# Patient Record
Sex: Female | Born: 1970 | Race: Black or African American | Hispanic: No | Marital: Single | Smoking: Never smoker
Health system: Southern US, Community
[De-identification: ages and names within clinical notes are randomized; demographics above are authoritative.]

## PROBLEM LIST (undated history)

## (undated) DIAGNOSIS — E119 Type 2 diabetes mellitus without complications: Secondary | ICD-10-CM

## (undated) DIAGNOSIS — N6452 Nipple discharge: Secondary | ICD-10-CM

## (undated) DIAGNOSIS — D649 Anemia, unspecified: Secondary | ICD-10-CM

## (undated) DIAGNOSIS — I071 Rheumatic tricuspid insufficiency: Secondary | ICD-10-CM

## (undated) DIAGNOSIS — Q210 Ventricular septal defect: Secondary | ICD-10-CM

## (undated) DIAGNOSIS — Z9889 Other specified postprocedural states: Secondary | ICD-10-CM

## (undated) DIAGNOSIS — E669 Obesity, unspecified: Secondary | ICD-10-CM

## (undated) DIAGNOSIS — J449 Chronic obstructive pulmonary disease, unspecified: Secondary | ICD-10-CM

## (undated) DIAGNOSIS — Z8489 Family history of other specified conditions: Secondary | ICD-10-CM

## (undated) DIAGNOSIS — I272 Pulmonary hypertension, unspecified: Secondary | ICD-10-CM

## (undated) DIAGNOSIS — T8859XA Other complications of anesthesia, initial encounter: Secondary | ICD-10-CM

## (undated) DIAGNOSIS — R011 Cardiac murmur, unspecified: Secondary | ICD-10-CM

## (undated) DIAGNOSIS — R0600 Dyspnea, unspecified: Secondary | ICD-10-CM

## (undated) DIAGNOSIS — R112 Nausea with vomiting, unspecified: Secondary | ICD-10-CM

## (undated) DIAGNOSIS — I209 Angina pectoris, unspecified: Secondary | ICD-10-CM

## (undated) DIAGNOSIS — I10 Essential (primary) hypertension: Secondary | ICD-10-CM

## (undated) DIAGNOSIS — R519 Headache, unspecified: Secondary | ICD-10-CM

## (undated) DIAGNOSIS — J45909 Unspecified asthma, uncomplicated: Secondary | ICD-10-CM

## (undated) HISTORY — DX: Pulmonary hypertension, unspecified: I27.20

## (undated) HISTORY — DX: Ventricular septal defect: Q21.0

## (undated) HISTORY — DX: Type 2 diabetes mellitus without complications: E11.9

## (undated) HISTORY — DX: Unspecified asthma, uncomplicated: J45.909

## (undated) HISTORY — PX: TUBAL LIGATION: SHX77

## (undated) HISTORY — DX: Rheumatic tricuspid insufficiency: I07.1

## (undated) HISTORY — PX: CYST EXCISION: SHX5701

## (undated) HISTORY — DX: Obesity, unspecified: E66.9

---

## 1997-11-03 ENCOUNTER — Encounter: Admission: RE | Admit: 1997-11-03 | Discharge: 1998-02-01 | Payer: Self-pay | Admitting: Orthopaedic Surgery

## 1998-01-13 ENCOUNTER — Emergency Department (HOSPITAL_COMMUNITY): Admission: EM | Admit: 1998-01-13 | Discharge: 1998-01-13 | Payer: Self-pay | Admitting: Emergency Medicine

## 1998-01-26 ENCOUNTER — Encounter: Admission: RE | Admit: 1998-01-26 | Discharge: 1998-04-26 | Payer: Self-pay | Admitting: Orthopaedic Surgery

## 1998-01-27 ENCOUNTER — Emergency Department (HOSPITAL_COMMUNITY): Admission: EM | Admit: 1998-01-27 | Discharge: 1998-01-27 | Payer: Self-pay | Admitting: *Deleted

## 1998-04-29 ENCOUNTER — Ambulatory Visit (HOSPITAL_COMMUNITY): Admission: RE | Admit: 1998-04-29 | Discharge: 1998-04-29 | Payer: Self-pay | Admitting: Cardiology

## 1998-05-22 ENCOUNTER — Encounter: Payer: Self-pay | Admitting: *Deleted

## 1998-05-22 ENCOUNTER — Emergency Department (HOSPITAL_COMMUNITY): Admission: EM | Admit: 1998-05-22 | Discharge: 1998-05-22 | Payer: Self-pay | Admitting: *Deleted

## 1998-07-31 ENCOUNTER — Emergency Department (HOSPITAL_COMMUNITY): Admission: EM | Admit: 1998-07-31 | Discharge: 1998-07-31 | Payer: Self-pay | Admitting: Emergency Medicine

## 1998-09-13 ENCOUNTER — Emergency Department (HOSPITAL_COMMUNITY): Admission: EM | Admit: 1998-09-13 | Discharge: 1998-09-13 | Payer: Self-pay | Admitting: Emergency Medicine

## 1998-09-18 ENCOUNTER — Encounter: Payer: Self-pay | Admitting: Emergency Medicine

## 1998-09-18 ENCOUNTER — Emergency Department (HOSPITAL_COMMUNITY): Admission: EM | Admit: 1998-09-18 | Discharge: 1998-09-18 | Payer: Self-pay | Admitting: Emergency Medicine

## 1999-02-21 ENCOUNTER — Emergency Department (HOSPITAL_COMMUNITY): Admission: EM | Admit: 1999-02-21 | Discharge: 1999-02-21 | Payer: Self-pay | Admitting: Emergency Medicine

## 1999-03-17 ENCOUNTER — Encounter: Admission: RE | Admit: 1999-03-17 | Discharge: 1999-03-17 | Payer: Self-pay | Admitting: Internal Medicine

## 1999-04-13 ENCOUNTER — Encounter: Admission: RE | Admit: 1999-04-13 | Discharge: 1999-04-13 | Payer: Self-pay | Admitting: Obstetrics & Gynecology

## 1999-06-07 ENCOUNTER — Other Ambulatory Visit: Admission: RE | Admit: 1999-06-07 | Discharge: 1999-06-07 | Payer: Self-pay | Admitting: *Deleted

## 1999-09-14 ENCOUNTER — Encounter: Payer: Self-pay | Admitting: Cardiology

## 1999-09-14 ENCOUNTER — Ambulatory Visit (HOSPITAL_COMMUNITY): Admission: RE | Admit: 1999-09-14 | Discharge: 1999-09-14 | Payer: Self-pay | Admitting: Cardiology

## 2000-05-31 ENCOUNTER — Emergency Department (HOSPITAL_COMMUNITY): Admission: EM | Admit: 2000-05-31 | Discharge: 2000-05-31 | Payer: Self-pay | Admitting: Emergency Medicine

## 2000-06-09 ENCOUNTER — Encounter: Admission: RE | Admit: 2000-06-09 | Discharge: 2000-09-07 | Payer: Self-pay | Admitting: Cardiology

## 2000-07-13 ENCOUNTER — Encounter: Payer: Self-pay | Admitting: Cardiology

## 2000-07-13 ENCOUNTER — Ambulatory Visit (HOSPITAL_COMMUNITY): Admission: RE | Admit: 2000-07-13 | Discharge: 2000-07-13 | Payer: Self-pay | Admitting: Cardiology

## 2000-07-14 ENCOUNTER — Emergency Department (HOSPITAL_COMMUNITY): Admission: EM | Admit: 2000-07-14 | Discharge: 2000-07-14 | Payer: Self-pay | Admitting: Emergency Medicine

## 2000-10-23 ENCOUNTER — Encounter: Payer: Self-pay | Admitting: Emergency Medicine

## 2000-10-23 ENCOUNTER — Emergency Department (HOSPITAL_COMMUNITY): Admission: EM | Admit: 2000-10-23 | Discharge: 2000-10-23 | Payer: Self-pay | Admitting: Emergency Medicine

## 2000-11-22 ENCOUNTER — Emergency Department (HOSPITAL_COMMUNITY): Admission: EM | Admit: 2000-11-22 | Discharge: 2000-11-22 | Payer: Self-pay | Admitting: Emergency Medicine

## 2000-11-22 ENCOUNTER — Encounter: Payer: Self-pay | Admitting: Emergency Medicine

## 2000-12-04 ENCOUNTER — Other Ambulatory Visit: Admission: RE | Admit: 2000-12-04 | Discharge: 2000-12-04 | Payer: Self-pay | Admitting: Obstetrics and Gynecology

## 2000-12-19 ENCOUNTER — Ambulatory Visit (HOSPITAL_COMMUNITY): Admission: RE | Admit: 2000-12-19 | Discharge: 2000-12-19 | Payer: Self-pay | Admitting: Cardiology

## 2000-12-25 ENCOUNTER — Other Ambulatory Visit: Admission: RE | Admit: 2000-12-25 | Discharge: 2000-12-25 | Payer: Self-pay | Admitting: Obstetrics and Gynecology

## 2000-12-26 ENCOUNTER — Other Ambulatory Visit: Admission: RE | Admit: 2000-12-26 | Discharge: 2000-12-26 | Payer: Self-pay | Admitting: Obstetrics and Gynecology

## 2000-12-26 ENCOUNTER — Encounter (INDEPENDENT_AMBULATORY_CARE_PROVIDER_SITE_OTHER): Payer: Self-pay | Admitting: Specialist

## 2001-02-17 ENCOUNTER — Emergency Department (HOSPITAL_COMMUNITY): Admission: EM | Admit: 2001-02-17 | Discharge: 2001-02-17 | Payer: Self-pay

## 2001-03-13 ENCOUNTER — Encounter: Admission: RE | Admit: 2001-03-13 | Discharge: 2001-06-11 | Payer: Self-pay | Admitting: Cardiology

## 2001-03-15 ENCOUNTER — Emergency Department (HOSPITAL_COMMUNITY): Admission: EM | Admit: 2001-03-15 | Discharge: 2001-03-15 | Payer: Self-pay

## 2001-03-24 ENCOUNTER — Emergency Department (HOSPITAL_COMMUNITY): Admission: EM | Admit: 2001-03-24 | Discharge: 2001-03-24 | Payer: Self-pay | Admitting: Emergency Medicine

## 2001-08-14 ENCOUNTER — Encounter: Payer: Self-pay | Admitting: Emergency Medicine

## 2001-08-14 ENCOUNTER — Emergency Department (HOSPITAL_COMMUNITY): Admission: EM | Admit: 2001-08-14 | Discharge: 2001-08-14 | Payer: Self-pay | Admitting: Emergency Medicine

## 2001-08-21 ENCOUNTER — Encounter: Payer: Self-pay | Admitting: Cardiology

## 2001-08-21 ENCOUNTER — Ambulatory Visit (HOSPITAL_COMMUNITY): Admission: RE | Admit: 2001-08-21 | Discharge: 2001-08-21 | Payer: Self-pay | Admitting: Cardiology

## 2001-09-15 ENCOUNTER — Emergency Department (HOSPITAL_COMMUNITY): Admission: EM | Admit: 2001-09-15 | Discharge: 2001-09-15 | Payer: Self-pay

## 2002-03-19 ENCOUNTER — Emergency Department (HOSPITAL_COMMUNITY): Admission: EM | Admit: 2002-03-19 | Discharge: 2002-03-19 | Payer: Self-pay | Admitting: Emergency Medicine

## 2002-03-19 ENCOUNTER — Encounter: Payer: Self-pay | Admitting: Emergency Medicine

## 2002-04-15 ENCOUNTER — Encounter: Admission: RE | Admit: 2002-04-15 | Discharge: 2002-04-15 | Payer: Self-pay | Admitting: Cardiology

## 2002-04-15 ENCOUNTER — Encounter: Payer: Self-pay | Admitting: Cardiology

## 2002-04-25 ENCOUNTER — Encounter: Payer: Self-pay | Admitting: Emergency Medicine

## 2002-04-25 ENCOUNTER — Emergency Department (HOSPITAL_COMMUNITY): Admission: EM | Admit: 2002-04-25 | Discharge: 2002-04-25 | Payer: Self-pay | Admitting: Emergency Medicine

## 2002-07-26 ENCOUNTER — Emergency Department (HOSPITAL_COMMUNITY): Admission: EM | Admit: 2002-07-26 | Discharge: 2002-07-27 | Payer: Self-pay | Admitting: Emergency Medicine

## 2002-07-26 ENCOUNTER — Encounter: Payer: Self-pay | Admitting: Emergency Medicine

## 2002-07-27 ENCOUNTER — Encounter: Payer: Self-pay | Admitting: Emergency Medicine

## 2002-10-21 ENCOUNTER — Emergency Department (HOSPITAL_COMMUNITY): Admission: EM | Admit: 2002-10-21 | Discharge: 2002-10-21 | Payer: Self-pay

## 2002-12-10 ENCOUNTER — Emergency Department (HOSPITAL_COMMUNITY): Admission: EM | Admit: 2002-12-10 | Discharge: 2002-12-10 | Payer: Self-pay | Admitting: Emergency Medicine

## 2003-01-16 ENCOUNTER — Encounter: Admission: RE | Admit: 2003-01-16 | Discharge: 2003-01-16 | Payer: Self-pay | Admitting: Cardiology

## 2003-01-16 ENCOUNTER — Encounter: Payer: Self-pay | Admitting: Cardiology

## 2003-03-19 ENCOUNTER — Emergency Department (HOSPITAL_COMMUNITY): Admission: EM | Admit: 2003-03-19 | Discharge: 2003-03-19 | Payer: Self-pay | Admitting: Emergency Medicine

## 2003-10-03 ENCOUNTER — Other Ambulatory Visit: Admission: RE | Admit: 2003-10-03 | Discharge: 2003-10-03 | Payer: Self-pay | Admitting: Obstetrics and Gynecology

## 2003-10-08 ENCOUNTER — Encounter: Admission: RE | Admit: 2003-10-08 | Discharge: 2003-10-08 | Payer: Self-pay | Admitting: Obstetrics and Gynecology

## 2004-01-15 ENCOUNTER — Encounter: Admission: RE | Admit: 2004-01-15 | Discharge: 2004-01-15 | Payer: Self-pay | Admitting: Cardiology

## 2004-01-23 ENCOUNTER — Emergency Department (HOSPITAL_COMMUNITY): Admission: EM | Admit: 2004-01-23 | Discharge: 2004-01-23 | Payer: Self-pay | Admitting: Family Medicine

## 2004-03-11 ENCOUNTER — Emergency Department (HOSPITAL_COMMUNITY): Admission: EM | Admit: 2004-03-11 | Discharge: 2004-03-11 | Payer: Self-pay | Admitting: Emergency Medicine

## 2004-03-23 ENCOUNTER — Encounter: Admission: RE | Admit: 2004-03-23 | Discharge: 2004-06-21 | Payer: Self-pay | Admitting: Cardiology

## 2004-04-27 ENCOUNTER — Emergency Department (HOSPITAL_COMMUNITY): Admission: EM | Admit: 2004-04-27 | Discharge: 2004-04-27 | Payer: Self-pay | Admitting: Family Medicine

## 2004-04-30 ENCOUNTER — Encounter: Admission: RE | Admit: 2004-04-30 | Discharge: 2004-04-30 | Payer: Self-pay | Admitting: Cardiology

## 2004-09-09 ENCOUNTER — Emergency Department (HOSPITAL_COMMUNITY): Admission: EM | Admit: 2004-09-09 | Discharge: 2004-09-09 | Payer: Self-pay | Admitting: Family Medicine

## 2005-04-08 ENCOUNTER — Emergency Department (HOSPITAL_COMMUNITY): Admission: EM | Admit: 2005-04-08 | Discharge: 2005-04-08 | Payer: Self-pay | Admitting: Family Medicine

## 2005-08-18 ENCOUNTER — Encounter: Admission: RE | Admit: 2005-08-18 | Discharge: 2005-08-18 | Payer: Self-pay | Admitting: Cardiology

## 2006-04-23 ENCOUNTER — Emergency Department (HOSPITAL_COMMUNITY): Admission: EM | Admit: 2006-04-23 | Discharge: 2006-04-23 | Payer: Self-pay | Admitting: Family Medicine

## 2006-04-27 ENCOUNTER — Emergency Department (HOSPITAL_COMMUNITY): Admission: EM | Admit: 2006-04-27 | Discharge: 2006-04-27 | Payer: Self-pay | Admitting: Family Medicine

## 2006-04-30 ENCOUNTER — Emergency Department (HOSPITAL_COMMUNITY): Admission: EM | Admit: 2006-04-30 | Discharge: 2006-04-30 | Payer: Self-pay | Admitting: Emergency Medicine

## 2006-12-25 ENCOUNTER — Encounter: Admission: RE | Admit: 2006-12-25 | Discharge: 2006-12-25 | Payer: Self-pay | Admitting: Cardiology

## 2007-01-19 ENCOUNTER — Encounter: Admission: RE | Admit: 2007-01-19 | Discharge: 2007-01-19 | Payer: Self-pay | Admitting: Cardiology

## 2007-06-11 ENCOUNTER — Emergency Department (HOSPITAL_COMMUNITY): Admission: EM | Admit: 2007-06-11 | Discharge: 2007-06-11 | Payer: Self-pay | Admitting: Family Medicine

## 2007-08-28 ENCOUNTER — Emergency Department (HOSPITAL_COMMUNITY): Admission: EM | Admit: 2007-08-28 | Discharge: 2007-08-28 | Payer: Self-pay | Admitting: Family Medicine

## 2007-09-03 ENCOUNTER — Emergency Department (HOSPITAL_COMMUNITY): Admission: EM | Admit: 2007-09-03 | Discharge: 2007-09-03 | Payer: Self-pay | Admitting: Emergency Medicine

## 2007-12-06 ENCOUNTER — Ambulatory Visit (HOSPITAL_COMMUNITY): Admission: RE | Admit: 2007-12-06 | Discharge: 2007-12-06 | Payer: Self-pay | Admitting: Cardiology

## 2008-02-04 ENCOUNTER — Emergency Department (HOSPITAL_COMMUNITY): Admission: EM | Admit: 2008-02-04 | Discharge: 2008-02-04 | Payer: Self-pay | Admitting: Emergency Medicine

## 2008-08-31 ENCOUNTER — Emergency Department (HOSPITAL_COMMUNITY): Admission: EM | Admit: 2008-08-31 | Discharge: 2008-08-31 | Payer: Self-pay | Admitting: Family Medicine

## 2008-11-11 ENCOUNTER — Ambulatory Visit (HOSPITAL_COMMUNITY): Admission: RE | Admit: 2008-11-11 | Discharge: 2008-11-11 | Payer: Self-pay | Admitting: Cardiology

## 2008-11-11 ENCOUNTER — Encounter (INDEPENDENT_AMBULATORY_CARE_PROVIDER_SITE_OTHER): Payer: Self-pay | Admitting: Cardiology

## 2008-12-15 ENCOUNTER — Emergency Department (HOSPITAL_COMMUNITY): Admission: EM | Admit: 2008-12-15 | Discharge: 2008-12-15 | Payer: Self-pay | Admitting: Emergency Medicine

## 2009-07-20 ENCOUNTER — Emergency Department (HOSPITAL_COMMUNITY): Admission: EM | Admit: 2009-07-20 | Discharge: 2009-07-20 | Payer: Self-pay | Admitting: Family Medicine

## 2009-08-11 ENCOUNTER — Emergency Department (HOSPITAL_COMMUNITY): Admission: EM | Admit: 2009-08-11 | Discharge: 2009-08-11 | Payer: Self-pay | Admitting: Family Medicine

## 2009-08-21 ENCOUNTER — Emergency Department (HOSPITAL_COMMUNITY): Admission: EM | Admit: 2009-08-21 | Discharge: 2009-08-21 | Payer: Self-pay | Admitting: Emergency Medicine

## 2010-01-19 ENCOUNTER — Emergency Department (HOSPITAL_COMMUNITY): Admission: EM | Admit: 2010-01-19 | Discharge: 2010-01-19 | Payer: Self-pay | Admitting: Emergency Medicine

## 2010-01-27 ENCOUNTER — Encounter: Admission: RE | Admit: 2010-01-27 | Discharge: 2010-01-27 | Payer: Self-pay | Admitting: Cardiology

## 2010-02-26 ENCOUNTER — Emergency Department (HOSPITAL_COMMUNITY): Admission: EM | Admit: 2010-02-26 | Discharge: 2010-02-26 | Payer: Self-pay | Admitting: Emergency Medicine

## 2010-04-28 ENCOUNTER — Emergency Department (HOSPITAL_COMMUNITY): Admission: EM | Admit: 2010-04-28 | Discharge: 2010-04-28 | Payer: Self-pay | Admitting: Family Medicine

## 2010-05-23 ENCOUNTER — Emergency Department (HOSPITAL_COMMUNITY)
Admission: EM | Admit: 2010-05-23 | Discharge: 2010-05-23 | Payer: Self-pay | Source: Home / Self Care | Admitting: Emergency Medicine

## 2010-06-22 ENCOUNTER — Emergency Department (HOSPITAL_COMMUNITY)
Admission: EM | Admit: 2010-06-22 | Discharge: 2010-06-22 | Payer: Self-pay | Source: Home / Self Care | Admitting: Emergency Medicine

## 2010-07-17 ENCOUNTER — Encounter: Payer: Self-pay | Admitting: Cardiology

## 2010-07-18 ENCOUNTER — Encounter: Payer: Self-pay | Admitting: Cardiology

## 2010-07-27 ENCOUNTER — Other Ambulatory Visit: Payer: Self-pay | Admitting: Cardiology

## 2010-07-27 DIAGNOSIS — R630 Anorexia: Secondary | ICD-10-CM

## 2010-07-29 ENCOUNTER — Inpatient Hospital Stay: Admission: RE | Admit: 2010-07-29 | Payer: Self-pay | Source: Ambulatory Visit

## 2010-07-29 ENCOUNTER — Other Ambulatory Visit (HOSPITAL_COMMUNITY): Payer: Self-pay | Admitting: Cardiology

## 2010-07-29 DIAGNOSIS — R630 Anorexia: Secondary | ICD-10-CM

## 2010-08-10 ENCOUNTER — Other Ambulatory Visit: Payer: Self-pay | Admitting: Obstetrics and Gynecology

## 2010-08-10 DIAGNOSIS — E010 Iodine-deficiency related diffuse (endemic) goiter: Secondary | ICD-10-CM

## 2010-08-11 ENCOUNTER — Ambulatory Visit (HOSPITAL_COMMUNITY)
Admission: RE | Admit: 2010-08-11 | Discharge: 2010-08-11 | Disposition: A | Discharge status: Home / Self Care | Payer: Medicaid Other | Source: Ambulatory Visit | Attending: Cardiology | Admitting: Cardiology

## 2010-08-11 DIAGNOSIS — R630 Anorexia: Secondary | ICD-10-CM | POA: Insufficient documentation

## 2010-08-11 DIAGNOSIS — R109 Unspecified abdominal pain: Secondary | ICD-10-CM | POA: Insufficient documentation

## 2010-08-11 MED ORDER — TECHNETIUM TC 99M SULFUR COLLOID
2.0000 | Freq: Once | INTRAVENOUS | Status: AC | PRN
  Administered 2010-08-11: 2 via INTRAVENOUS

## 2010-08-12 ENCOUNTER — Ambulatory Visit
Admission: RE | Admit: 2010-08-12 | Discharge: 2010-08-12 | Disposition: A | Discharge status: Home / Self Care | Payer: Medicaid Other | Source: Ambulatory Visit | Attending: Obstetrics and Gynecology | Admitting: Obstetrics and Gynecology

## 2010-08-12 DIAGNOSIS — E010 Iodine-deficiency related diffuse (endemic) goiter: Secondary | ICD-10-CM

## 2010-09-09 LAB — DIFFERENTIAL
Lymphs Abs: 1.7 10*3/uL (ref 0.7–4.0)
Monocytes Relative: 9 % (ref 3–12)

## 2010-09-09 LAB — POCT I-STAT, CHEM 8
Calcium, Ion: 1.27 mmol/L (ref 1.12–1.32)
Chloride: 105 mEq/L (ref 96–112)
HCT: 36 % (ref 36.0–46.0)
Hemoglobin: 12.2 g/dL (ref 12.0–15.0)
Potassium: 4 mEq/L (ref 3.5–5.1)

## 2010-09-09 LAB — CBC
HCT: 33.7 % — ABNORMAL LOW (ref 36.0–46.0)
Hemoglobin: 11 g/dL — ABNORMAL LOW (ref 12.0–15.0)
MCH: 26.1 pg (ref 26.0–34.0)
MCHC: 32.6 g/dL (ref 30.0–36.0)
MCV: 80 fL (ref 78.0–100.0)
Platelets: 207 10*3/uL (ref 150–400)
WBC: 5.4 10*3/uL (ref 4.0–10.5)

## 2010-09-09 LAB — D-DIMER, QUANTITATIVE: D-Dimer, Quant: 0.29 ug/mL-FEU (ref 0.00–0.48)

## 2010-09-22 ENCOUNTER — Emergency Department (HOSPITAL_COMMUNITY)
Admission: EM | Admit: 2010-09-22 | Discharge: 2010-09-23 | Disposition: A | Discharge status: Home / Self Care | Payer: No Typology Code available for payment source | Attending: Emergency Medicine | Admitting: Emergency Medicine

## 2010-09-22 DIAGNOSIS — S2341XA Sprain of ribs, initial encounter: Secondary | ICD-10-CM | POA: Insufficient documentation

## 2010-09-22 DIAGNOSIS — R0789 Other chest pain: Secondary | ICD-10-CM | POA: Insufficient documentation

## 2010-12-31 ENCOUNTER — Inpatient Hospital Stay (INDEPENDENT_AMBULATORY_CARE_PROVIDER_SITE_OTHER)
Admission: RE | Admit: 2010-12-31 | Discharge: 2010-12-31 | Disposition: A | Discharge status: Home / Self Care | Payer: Medicaid Other | Source: Ambulatory Visit | Attending: Family Medicine | Admitting: Family Medicine

## 2010-12-31 DIAGNOSIS — W57XXXA Bitten or stung by nonvenomous insect and other nonvenomous arthropods, initial encounter: Secondary | ICD-10-CM

## 2010-12-31 DIAGNOSIS — T148 Other injury of unspecified body region: Secondary | ICD-10-CM

## 2011-02-08 ENCOUNTER — Inpatient Hospital Stay (INDEPENDENT_AMBULATORY_CARE_PROVIDER_SITE_OTHER)
Admission: RE | Admit: 2011-02-08 | Discharge: 2011-02-08 | Disposition: A | Discharge status: Home / Self Care | Payer: Medicaid Other | Source: Ambulatory Visit | Attending: Family Medicine | Admitting: Family Medicine

## 2011-02-08 DIAGNOSIS — N61 Mastitis without abscess: Secondary | ICD-10-CM

## 2011-02-08 DIAGNOSIS — R1013 Epigastric pain: Secondary | ICD-10-CM

## 2011-02-08 DIAGNOSIS — B354 Tinea corporis: Secondary | ICD-10-CM

## 2011-02-08 LAB — POCT URINALYSIS DIP (DEVICE)
Glucose, UA: NEGATIVE mg/dL
Leukocytes, UA: NEGATIVE
Nitrite: NEGATIVE
Protein, ur: NEGATIVE mg/dL
Specific Gravity, Urine: >=1.03 (ref 1.005–1.030)

## 2011-02-28 ENCOUNTER — Inpatient Hospital Stay (INDEPENDENT_AMBULATORY_CARE_PROVIDER_SITE_OTHER)
Admission: RE | Admit: 2011-02-28 | Discharge: 2011-02-28 | Disposition: A | Discharge status: Home / Self Care | Payer: Medicaid Other | Source: Ambulatory Visit | Attending: Family Medicine | Admitting: Family Medicine

## 2011-02-28 DIAGNOSIS — L259 Unspecified contact dermatitis, unspecified cause: Secondary | ICD-10-CM

## 2011-03-15 ENCOUNTER — Emergency Department (HOSPITAL_COMMUNITY)
Admission: EM | Admit: 2011-03-15 | Discharge: 2011-03-15 | Disposition: A | Discharge status: Home / Self Care | Payer: Medicaid Other | Attending: Emergency Medicine | Admitting: Emergency Medicine

## 2011-03-15 ENCOUNTER — Emergency Department (HOSPITAL_COMMUNITY): Payer: Medicaid Other

## 2011-03-15 DIAGNOSIS — M25569 Pain in unspecified knee: Secondary | ICD-10-CM | POA: Insufficient documentation

## 2011-04-21 ENCOUNTER — Inpatient Hospital Stay (INDEPENDENT_AMBULATORY_CARE_PROVIDER_SITE_OTHER)
Admission: RE | Admit: 2011-04-21 | Discharge: 2011-04-21 | Disposition: A | Discharge status: Home / Self Care | Payer: Medicaid Other | Source: Ambulatory Visit | Attending: Family Medicine | Admitting: Family Medicine

## 2011-04-21 DIAGNOSIS — J069 Acute upper respiratory infection, unspecified: Secondary | ICD-10-CM

## 2011-04-21 DIAGNOSIS — J029 Acute pharyngitis, unspecified: Secondary | ICD-10-CM

## 2011-05-29 ENCOUNTER — Emergency Department (HOSPITAL_COMMUNITY): Payer: Medicaid Other

## 2011-05-29 ENCOUNTER — Emergency Department (HOSPITAL_COMMUNITY)
Admission: EM | Admit: 2011-05-29 | Discharge: 2011-05-29 | Disposition: A | Discharge status: Home / Self Care | Payer: Medicaid Other | Attending: Emergency Medicine | Admitting: Emergency Medicine

## 2011-05-29 ENCOUNTER — Encounter (HOSPITAL_COMMUNITY): Payer: Self-pay

## 2011-05-29 DIAGNOSIS — R509 Fever, unspecified: Secondary | ICD-10-CM | POA: Insufficient documentation

## 2011-05-29 DIAGNOSIS — R110 Nausea: Secondary | ICD-10-CM | POA: Insufficient documentation

## 2011-05-29 DIAGNOSIS — R059 Cough, unspecified: Secondary | ICD-10-CM | POA: Insufficient documentation

## 2011-05-29 DIAGNOSIS — J069 Acute upper respiratory infection, unspecified: Secondary | ICD-10-CM | POA: Insufficient documentation

## 2011-05-29 DIAGNOSIS — R05 Cough: Secondary | ICD-10-CM | POA: Insufficient documentation

## 2011-05-29 DIAGNOSIS — R51 Headache: Secondary | ICD-10-CM | POA: Insufficient documentation

## 2011-05-29 DIAGNOSIS — R011 Cardiac murmur, unspecified: Secondary | ICD-10-CM | POA: Insufficient documentation

## 2011-05-29 DIAGNOSIS — W1809XA Striking against other object with subsequent fall, initial encounter: Secondary | ICD-10-CM | POA: Insufficient documentation

## 2011-05-29 LAB — POCT I-STAT, CHEM 8
BUN: 11 mg/dL (ref 6–23)
Calcium, Ion: 1.12 mmol/L (ref 1.12–1.32)
Chloride: 99 mEq/L (ref 96–112)
Creatinine, Ser: 1.1 mg/dL (ref 0.50–1.10)
Glucose, Bld: 154 mg/dL — ABNORMAL HIGH (ref 70–99)
HCT: 43 % (ref 36.0–46.0)
Hemoglobin: 14.6 g/dL (ref 12.0–15.0)
Potassium: 3.8 mEq/L (ref 3.5–5.1)
Sodium: 138 mEq/L (ref 135–145)
TCO2: 28 mmol/L (ref 0–100)

## 2011-05-29 LAB — POCT I-STAT TROPONIN I: Troponin i, poc: 0 ng/mL (ref 0.00–0.08)

## 2011-05-29 MED ORDER — IBUPROFEN 800 MG PO TABS
800.0000 mg | ORAL_TABLET | Freq: Once | ORAL | Status: AC
  Administered 2011-05-29: 800 mg via ORAL
  Filled 2011-05-29: qty 1

## 2011-05-29 MED ORDER — ONDANSETRON 8 MG PO TBDP
8.0000 mg | ORAL_TABLET | Freq: Once | ORAL | Status: AC
  Administered 2011-05-29: 8 mg via ORAL
  Filled 2011-05-29: qty 1

## 2011-05-29 MED ORDER — ONDANSETRON HCL 4 MG PO TABS: 4.0000 mg | ORAL_TABLET | Freq: Four times a day (QID) | ORAL | Status: AC

## 2011-05-29 NOTE — ED Notes (Signed)
Pt in from home with c/o generalized body aches x2 days states syncope episode yesterday unwitnessed states nose pain states cold sx no relief no apparent distress

## 2011-05-29 NOTE — ED Provider Notes (Signed)
History     CSN: [id number] Arrival date & time: 05/29/2011  8:36 AM   First MD Initiated Contact with Patient 05/29/11 1029      Chief Complaint  Patient presents with  . Generalized Body Aches    (Consider location/radiation/quality/duration/timing/severity/associated sxs/prior treatment) HPI Comments: Patient reports that she has had generalized body aches, productive cough, sore throat, rhinorrhea, and congestion for the past week.  She reports that yesterday she had a syncopal episode. She reports that she was urinating and then began feeling dizzy and fell to the ground after she stood up from the toilet.  She reports that she hit her nose on the toilet when she fell and has been having pain in her nose since that time.  No difficulty breathing out of her nose.  She also is describing a dull chest pain.  She reports that the pain is worse with coughing and deep inspiration.  She also reports that she had a fever yesterday that was 101F orally.  She reports that she began feeling nauseous earlier today.  No vomiting.  Patient is a 40 y.o. female presenting with URI. The history is provided by the patient.  URI The primary symptoms include fever, fatigue, headaches, sore throat, cough and nausea. Primary symptoms do not include ear pain, wheezing, abdominal pain, vomiting or rash. The current episode started 3 to 5 days ago. This is a new problem. The problem has been gradually worsening.  The headache is not associated with neck stiffness.  Symptoms associated with the illness include chills, congestion and rhinorrhea. The illness is not associated with facial pain or sinus pressure.    History reviewed. No pertinent past medical history.  History reviewed. No pertinent past surgical history.  No family history on file.  History  Substance Use Topics  . Smoking status: Never Smoker   . Smokeless tobacco: Not on file  . Alcohol Use: No    OB History    Grav Para Term Preterm  Abortions TAB SAB Ect Mult Living                  Review of Systems  Constitutional: Positive for fever, chills, appetite change and fatigue.  HENT: Positive for congestion, sore throat and rhinorrhea. Negative for ear pain, neck pain, neck stiffness and sinus pressure.   Eyes: Negative for visual disturbance.  Respiratory: Positive for cough. Negative for wheezing.   Gastrointestinal: Positive for nausea. Negative for vomiting, abdominal pain, diarrhea and constipation.  Genitourinary: Negative for dysuria, hematuria, flank pain and decreased urine volume.  Skin: Negative for rash.  Neurological: Positive for headaches.    Allergies  Penicillins  Home Medications  No current outpatient prescriptions on file.  BP 117/72  Pulse 98  Temp(Src) 97.8 F (36.6 C) (Oral)  Resp 20  SpO2 99%  LMP 04/28/2011  Physical Exam  Constitutional: She is oriented to person, place, and time. She appears well-developed and well-nourished. No distress.  HENT:  Head: Normocephalic and atraumatic. Head is without contusion.  Right Ear: Tympanic membrane normal. No hemotympanum.  Left Ear: Tympanic membrane normal. No hemotympanum.  Nose: Mucosal edema present.  Mouth/Throat: Uvula is midline, oropharynx is clear and moist and mucous membranes are normal.  Eyes: EOM are normal. Pupils are equal, round, and reactive to light.  Neck: Normal range of motion. Neck supple.  Cardiovascular: Normal rate and regular rhythm.   Murmur heard. Pulmonary/Chest: Effort normal and breath sounds normal. No respiratory distress. She has no wheezes.  Abdominal: Soft. Bowel sounds are normal. There is no tenderness.  Musculoskeletal: Normal range of motion.  Lymphadenopathy:    She has no cervical adenopathy.  Neurological: She is alert and oriented to person, place, and time. No cranial nerve deficit.  Skin: Skin is warm and dry. No rash noted. She is not diaphoretic.  Psychiatric: She has a normal mood and  affect.    ED Course  Procedures (including critical care time)   Labs Reviewed  I-STAT TROPONIN I  I-STAT, CHEM 8   Dg Nasal Bones  05/29/2011  *RADIOLOGY REPORT*  Clinical Data: Fall, nasal bone tenderness.  NASAL BONES - 3+ VIEW  Comparison: None.  Findings: No evidence of nasal bone fracture.  Nasal septum is midline. Paranasal sinuses appear clear.  IMPRESSION: No evidence of nasal bone fracture.  Original Report Authenticated By: Cyndie Chime, M.D.   Dg Chest 2 View  05/29/2011  *RADIOLOGY REPORT*  Clinical Data: Fever and productive cough.  Rule out pneumonia.  CHEST - 2 VIEW  Comparison: 02/26/2010  Findings: Two views of the chest demonstrate a stable appearance of the heart and mediastinum.  The heart size is upper limits of normal.  Lungs are clear without airspace disease or edema.  No evidence for effusions.  Bony structures are intact. The patient has a known nodule in the right upper lung and possibly seen on the lateral view.  IMPRESSION: No acute chest findings.  Original Report Authenticated By: Richarda Overlie, M.D.     No diagnosis found.   Date: 05/29/2011  Rate: 94  Rhythm: normal sinus rhythm  QRS Axis: normal  Intervals: normal  ST/T Wave abnormalities: normal  Conduction Disutrbances:none  Narrative Interpretation:   Old EKG Reviewed: none available   MDM  Due to new syncopal episode and current chest pain, an EKG and troponin were ordered along with an istat 8.  Normal EKG, troponin 0.0, and normal labs.  Negative CXR.  Therefore, feel that patient's symptoms most likely viral.  Patient instructed to follow up with her PCP.        Pascal Lux Wingen 05/30/11 1242  Brigg Cape Zenaida Niece Wingen 05/30/11 1243

## 2011-05-30 NOTE — ED Provider Notes (Signed)
Medical screening examination/treatment/procedure(s) were performed by non-physician practitioner and as supervising physician I was immediately available for consultation/collaboration.  Raeford Razor, MD 05/30/11 [phone]

## 2011-07-31 ENCOUNTER — Other Ambulatory Visit: Payer: Self-pay

## 2011-07-31 ENCOUNTER — Emergency Department (HOSPITAL_COMMUNITY): Payer: Medicaid Other

## 2011-07-31 ENCOUNTER — Encounter (HOSPITAL_COMMUNITY): Payer: Self-pay | Admitting: *Deleted

## 2011-07-31 ENCOUNTER — Emergency Department (HOSPITAL_COMMUNITY)
Admission: EM | Admit: 2011-07-31 | Discharge: 2011-07-31 | Disposition: A | Discharge status: Home / Self Care | Payer: Medicaid Other | Attending: Emergency Medicine | Admitting: Emergency Medicine

## 2011-07-31 DIAGNOSIS — R7309 Other abnormal glucose: Secondary | ICD-10-CM | POA: Insufficient documentation

## 2011-07-31 DIAGNOSIS — R079 Chest pain, unspecified: Secondary | ICD-10-CM | POA: Insufficient documentation

## 2011-07-31 DIAGNOSIS — R0602 Shortness of breath: Secondary | ICD-10-CM | POA: Insufficient documentation

## 2011-07-31 DIAGNOSIS — R0789 Other chest pain: Secondary | ICD-10-CM

## 2011-07-31 DIAGNOSIS — R739 Hyperglycemia, unspecified: Secondary | ICD-10-CM

## 2011-07-31 HISTORY — DX: Essential (primary) hypertension: I10

## 2011-07-31 LAB — POCT I-STAT, CHEM 8
BUN: 11 mg/dL (ref 6–23)
Calcium, Ion: 1.22 mmol/L (ref 1.12–1.32)
Chloride: 106 mEq/L (ref 96–112)
Creatinine, Ser: 0.7 mg/dL (ref 0.50–1.10)
Glucose, Bld: 158 mg/dL — ABNORMAL HIGH (ref 70–99)
TCO2: 28 mmol/L (ref 0–100)

## 2011-07-31 LAB — BASIC METABOLIC PANEL
BUN: 11 mg/dL (ref 6–23)
CO2: 27 mEq/L (ref 19–32)
Chloride: 104 mEq/L (ref 96–112)
GFR calc non Af Amer: >90 mL/min (ref >90)
Glucose, Bld: 150 mg/dL — ABNORMAL HIGH (ref 70–99)
Potassium: 4.7 mEq/L (ref 3.5–5.1)

## 2011-07-31 LAB — CBC
HCT: 38.1 % (ref 36.0–46.0)
Hemoglobin: 13.7 g/dL (ref 12.0–15.0)
MCH: 31.6 pg (ref 26.0–34.0)
MCHC: 36 g/dL (ref 30.0–36.0)
MCV: 88 fL (ref 78.0–100.0)
RBC: 4.33 MIL/uL (ref 3.87–5.11)

## 2011-07-31 MED ORDER — KETOROLAC TROMETHAMINE 30 MG/ML IJ SOLN
30.0000 mg | Freq: Once | INTRAMUSCULAR | Status: AC
  Administered 2011-07-31: 30 mg via INTRAMUSCULAR
  Filled 2011-07-31: qty 1

## 2011-07-31 MED ORDER — HYDROCODONE-ACETAMINOPHEN 5-500 MG PO TABS: 1.0000 | ORAL_TABLET | Freq: Four times a day (QID) | ORAL | Status: AC | PRN

## 2011-07-31 NOTE — ED Provider Notes (Signed)
History     CSN: [id number]  Arrival date & time 07/31/11  1639   First MD Initiated Contact with Patient 07/31/11 1646      Chief Complaint  Patient presents with  . Chest Pain   patient reports sharp, achy chest pain for the past 3 days. She states it is especially worsened with certain positions and certain movements, but not necessarily with exertion. She has no shortness of breath, but some pain with deep breathing. She does have a history of VSD since childhood, but no other extensive cardiac history. She's had no fevers or cough. No vomiting. No trauma. She's been taking ibuprofen with minimal relief. She also states it is especially painful if she pushes on her chest.  No smoking or any illicit drugs.  No strong h/o family premature HD.   (Consider location/radiation/quality/duration/timing/severity/associated sxs/prior treatment) HPI  History reviewed. No pertinent past medical history.  Past Surgical History  Procedure Date  . Tubal ligation     History reviewed. No pertinent family history.  History  Substance Use Topics  . Smoking status: Never Smoker   . Smokeless tobacco: Never Used  . Alcohol Use: No    OB History    Grav Para Term Preterm Abortions TAB SAB Ect Mult Living                  Review of Systems  All other systems reviewed and are negative.    Allergies  Penicillins  Home Medications   Current Outpatient Rx  Name Route Sig Dispense Refill  . LORAZEPAM 0.5 MG PO TABS Oral Take 0.5-1 mg by mouth every 8 (eight) hours. For anxiety      . NORETHIN-ETH ESTRAD-FE BIPHAS 1 MG-10 MCG / 10 MCG PO TABS Oral Take 1 tablet by mouth daily.        BP 154/90  Pulse 90  Temp(Src) 97.9 F (36.6 C) (Oral)  Resp 16  Wt 164 lb (74.39 kg)  SpO2 99%  LMP 07/31/2011  Physical Exam  Nursing note and vitals reviewed. Constitutional: She is oriented to person, place, and time. She appears well-developed and well-nourished.       Calm comfortable,  and cooperative  HENT:  Head: Normocephalic and atraumatic.  Eyes: Conjunctivae and EOM are normal. Pupils are equal, round, and reactive to light.  Neck: Neck supple.  Cardiovascular: Normal rate and regular rhythm.  Exam reveals no gallop and no friction rub.   No murmur heard. Pulmonary/Chest: Breath sounds normal. She has no wheezes. She has no rales. She exhibits no tenderness.       Diffuse tenderness to palpation of the chest wall. No redness or swelling  Abdominal: Soft. Bowel sounds are normal. She exhibits no distension. There is no tenderness. There is no rebound and no guarding.  Musculoskeletal: Normal range of motion.  Neurological: She is alert and oriented to person, place, and time. No cranial nerve deficit. Coordination normal.  Skin: Skin is warm and dry. No rash noted.  Psychiatric: She has a normal mood and affect.    ED Course  Procedures (including critical care time)  Labs Reviewed - No data to display No results found.   No diagnosis found.    MDM   Date: 07/31/2011  Rate: 84  Rhythm: normal sinus rhythm  QRS Axis: normal  Intervals: normal  ST/T Wave abnormalities: nonspecific ST changes and early repolarization  Conduction Disutrbances:none  Narrative Interpretation:   Old EKG Reviewed: unchanged Comp. To Feb 26, 2010.  Possible LVH.       Patient's Medications  New Prescriptions   No medications on file  Previous Medications   NORETHINDRONE-ETHINYL ESTRADIOL-FE BIPHAS (LO LOESTRIN FE) 1 MG-10 MCG / 10 MCG TABLET    Take 1 tablet by mouth daily.    Modified Medications   No medications on file  Discontinued Medications   LORAZEPAM (ATIVAN) 0.5 MG TABLET    Take 0.5-1 mg by mouth every 8 (eight) hours. For anxiety       6:16 PM  Patient was given her test results. Chest x-ray, troponin, electrolytes were normal. CBC was normal. Blood sugar was elevated at 150, possibly consistent with early type 2 diabetes. Patient was given  recommendations for diet. She was also, told she'll need to see her primary care physician and have a flat, fasting blood sugar done in possibly other outpatient tests.  The chest pain appears to be atypical and will be given a prescription for pain medication. She'll be discharged home in stable improved condition again. Pain has been going on for several days. Reproducible with palpation. Return to ED for any concerns  Theron Arista A. Patrica Duel, MD 07/31/11 4098

## 2011-07-31 NOTE — ED Notes (Signed)
Pt reports sudden onset of chest pressure to center of chest. Pt reports pain is constant. Pt reports pain to arm and leg on left side comes and goes. Pt denies nausea. Pt reports intermittent nausea. 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 Pt took ibuprofen about 2 hours ago. Pt reports taking this medication for the past few days without relief.

## 2011-07-31 NOTE — ED Notes (Signed)
Pt from home c/o left chest pain for 3 days accompanied by nausea and radiation to left arm and left leg.

## 2011-08-30 ENCOUNTER — Ambulatory Visit (HOSPITAL_BASED_OUTPATIENT_CLINIC_OR_DEPARTMENT_OTHER): Payer: Medicaid Other | Attending: Cardiology | Admitting: General Practice

## 2011-08-30 VITALS — Ht 59.0 in | Wt 165.0 lb

## 2011-08-30 DIAGNOSIS — G4700 Insomnia, unspecified: Secondary | ICD-10-CM | POA: Insufficient documentation

## 2011-08-30 DIAGNOSIS — G4733 Obstructive sleep apnea (adult) (pediatric): Secondary | ICD-10-CM

## 2011-08-30 DIAGNOSIS — G4730 Sleep apnea, unspecified: Secondary | ICD-10-CM | POA: Insufficient documentation

## 2011-09-04 DIAGNOSIS — G4730 Sleep apnea, unspecified: Secondary | ICD-10-CM

## 2011-09-04 DIAGNOSIS — G4700 Insomnia, unspecified: Secondary | ICD-10-CM

## 2011-09-05 ENCOUNTER — Encounter (INDEPENDENT_AMBULATORY_CARE_PROVIDER_SITE_OTHER): Payer: Medicaid Other | Admitting: Obstetrics and Gynecology

## 2011-09-05 DIAGNOSIS — Z202 Contact with and (suspected) exposure to infections with a predominantly sexual mode of transmission: Secondary | ICD-10-CM

## 2011-09-05 DIAGNOSIS — N946 Dysmenorrhea, unspecified: Secondary | ICD-10-CM

## 2011-09-05 NOTE — Procedures (Signed)
NAME:  Mackenzie Patterson, MICEK NO.:  [id number]  MEDICAL RECORD NO.:  [id number]          PATIENT TYPE:  OUT  LOCATION:  SLEEP CENTER                 FACILITY:  [facility]  PHYSICIAN:  Clinton D. Maple Hudson, MD, FCCP, FACPDATE OF BIRTH:  12/07/71  DATE OF STUDY:  08/30/2011                           NOCTURNAL POLYSOMNOGRAM  REFERRING PHYSICIAN:  Osvaldo Shipper. Spruill, M.D.  INDICATION FOR STUDY:  Insomnia with sleep apnea.  EPWORTH SLEEPINESS SCORE:  15/24.  BMI 33.3, weight 165 pounds, height 59 inches, neck 14.5 inches.  HOME MEDICATIONS:  Charted and reviewed.  SLEEP ARCHITECTURE:  Total sleep time 364.5 minutes with sleep efficiency 88%.  Stage I was 6.7%, stage II 75%, stage III absent, REM 18.2% of total sleep time.  Sleep latency 16 minutes.  REM latency 16.5 minutes.  Awake after sleep onset 34 minutes.  Arousal index 11.2. Bedtime medication:  None.  RESPIRATORY DATA:  Apnea/hypopnea index (AHI) 0.3 per hour.  A total of 2 events were scored, including 1 central apnea and 1 hypopnea, both associated with supine sleep position and REM.  REM AHI 0.9 per hour.  OXYGEN DATA:  Moderate snoring with oxygen desaturation to a nadir of 93% and mean oxygen saturation through the study of 97.3% on room air.  CARDIAC DATA:  Normal sinus rhythm.  MOVEMENT/PARASOMNIA:  A total of 38 limb jerks were counted, of which 6 were associated with arousals or awakenings for periodic limb movements with arousal index of 1 per hour.  No bathroom trips.  IMPRESSION/RECOMMENDATION: 1. Occasional respiratory events with sleep disturbance, within normal     limits.  AHI 0.3 per hour (the normal range for adults is from 0-5     events per hour).  Moderate snoring with oxygen desaturation to a     nadir of 93% and mean oxygen saturation through the study of 97.3%     on room air. 2. Sleep architecture was not remarkable for first night in an     unfamiliar environment.  There were several brief  spontaneous     wakings suggesting that she might benefit from treatment as     insomnia if clinically appropriate.    Clinton D. Maple Hudson, MD, Tonny Bollman, FACP Diplomate, American Board of Sleep Medicine   CDY/MEDQ  D:  09/04/2011 09:38:58  T:  09/05/2011 01:43:57  Job:  [id number]

## 2011-09-26 ENCOUNTER — Encounter (INDEPENDENT_AMBULATORY_CARE_PROVIDER_SITE_OTHER): Payer: Medicaid Other | Admitting: Obstetrics and Gynecology

## 2011-09-26 DIAGNOSIS — N949 Unspecified condition associated with female genital organs and menstrual cycle: Secondary | ICD-10-CM

## 2011-10-12 ENCOUNTER — Telehealth: Payer: Self-pay | Admitting: Obstetrics and Gynecology

## 2011-10-17 ENCOUNTER — Other Ambulatory Visit: Payer: Self-pay

## 2011-10-17 DIAGNOSIS — N926 Irregular menstruation, unspecified: Secondary | ICD-10-CM

## 2011-10-24 ENCOUNTER — Encounter: Payer: Medicaid Other | Admitting: Obstetrics and Gynecology

## 2011-10-25 ENCOUNTER — Encounter: Payer: Medicaid Other | Admitting: Obstetrics and Gynecology

## 2011-11-03 ENCOUNTER — Encounter: Payer: Medicaid Other | Admitting: Obstetrics and Gynecology

## 2011-11-20 ENCOUNTER — Emergency Department (HOSPITAL_COMMUNITY)
Admission: EM | Admit: 2011-11-20 | Discharge: 2011-11-20 | Disposition: A | Discharge status: Home / Self Care | Payer: Medicaid Other | Attending: Emergency Medicine | Admitting: Emergency Medicine

## 2011-11-20 ENCOUNTER — Encounter (HOSPITAL_COMMUNITY): Payer: Self-pay | Admitting: Emergency Medicine

## 2011-11-20 DIAGNOSIS — R51 Headache: Secondary | ICD-10-CM | POA: Insufficient documentation

## 2011-11-20 DIAGNOSIS — R519 Headache, unspecified: Secondary | ICD-10-CM

## 2011-11-20 DIAGNOSIS — H9209 Otalgia, unspecified ear: Secondary | ICD-10-CM | POA: Insufficient documentation

## 2011-11-20 DIAGNOSIS — R070 Pain in throat: Secondary | ICD-10-CM | POA: Insufficient documentation

## 2011-11-20 DIAGNOSIS — Z79899 Other long term (current) drug therapy: Secondary | ICD-10-CM | POA: Insufficient documentation

## 2011-11-20 DIAGNOSIS — I10 Essential (primary) hypertension: Secondary | ICD-10-CM

## 2011-11-20 LAB — POCT I-STAT, CHEM 8
BUN: 11 mg/dL (ref 6–23)
Calcium, Ion: 1.22 mmol/L (ref 1.12–1.32)
Chloride: 103 mEq/L (ref 96–112)
Creatinine, Ser: 0.8 mg/dL (ref 0.50–1.10)
TCO2: 26 mmol/L (ref 0–100)

## 2011-11-20 MED ORDER — OXYCODONE-ACETAMINOPHEN 5-325 MG PO TABS: 1.0000 | ORAL_TABLET | ORAL | Status: AC | PRN

## 2011-11-20 MED ORDER — OXYCODONE-ACETAMINOPHEN 5-325 MG PO TABS
1.0000 | ORAL_TABLET | Freq: Once | ORAL | Status: AC
  Administered 2011-11-20: 1 via ORAL
  Filled 2011-11-20: qty 1

## 2011-11-20 NOTE — ED Notes (Signed)
Discharge instructions reviewed and prescription given  patient has no questions at this time.

## 2011-11-20 NOTE — Discharge Instructions (Signed)
General Headache, Without Cause A general headache has no specific cause. These headaches are not life-threatening. They will not lead to other types of headaches. HOME CARE   Make and keep follow-up visits with your doctor.   Only take medicine as told by your doctor.   Try to relax, get a massage, or use your thoughts to control your body (biofeedback).   Apply cold or heat to the head and neck. Apply 3 or 4 times a day or as needed.  Finding out the results of your test Ask when your test results will be ready. Make sure you get your test results. GET HELP RIGHT AWAY IF:   You have problems with medicine.   Your medicine does not help relieve pain.   Your headache changes or becomes worse.   You feel sick to your stomach (nauseous) or throw up (vomit).   You have a temperature by mouth above 102 F (38.9 C), not controlled by medicine.   Your have a stiff neck.   You have vision loss.   You have muscle weakness.   You lose control of your muscles.   You lose balance or have trouble walking.   You feel like you are going to pass out (faint).  MAKE SURE YOU:   Understand these instructions.   Will watch this condition.   Will get help right away if you are not doing well or get worse.  Document Released: 03/22/2008 Document Revised: 06/02/2011 Document Reviewed: 03/22/2008 ExitCare Patient Information 2012 [employer].Arterial Hypertension Arterial hypertension (high blood pressure) is a condition of elevated pressure in your blood vessels. Hypertension over a long period of time is a risk factor for strokes, heart attacks, and heart failure. It is also the leading cause of kidney (renal) failure.  CAUSES   In Adults -- Over 90% of all hypertension has no known cause. This is called essential or primary hypertension. In the other 10% of people with hypertension, the increase in blood pressure is caused by another disorder. This is called secondary hypertension.  Important causes of secondary hypertension are:   Heavy alcohol use.   Obstructive sleep apnea.   Hyperaldosterosim (Conn's syndrome).   Steroid use.   Chronic kidney failure.   Hyperparathyroidism.   Medications.   Renal artery stenosis.   Pheochromocytoma.   Cushing's disease.   Coarctation of the aorta.   Scleroderma renal crisis.   Licorice (in excessive amounts).   Drugs (cocaine, methamphetamine).  Your caregiver can explain any items above that apply to you.  In Children -- Secondary hypertension is more common and should always be considered.   Pregnancy -- Few women of childbearing age have high blood pressure. However, up to 10% of them develop hypertension of pregnancy. Generally, this will not harm the woman. It may be a sign of 3 complications of pregnancy: preeclampsia, HELLP syndrome, and eclampsia. Follow up and control with medication is necessary.  SYMPTOMS   This condition normally does not produce any noticeable symptoms. It is usually found during a routine exam.   Malignant hypertension is a late problem of high blood pressure. It may have the following symptoms:   Headaches.   Blurred vision.   End-organ damage (this means your kidneys, heart, lungs, and other organs are being damaged).   Stressful situations can increase the blood pressure. If a person with normal blood pressure has their blood pressure go up while being seen by their caregiver, this is often termed "white coat hypertension." Its   importance is not known. It may be related with eventually developing hypertension or complications of hypertension.   Hypertension is often confused with mental tension, stress, and anxiety.  DIAGNOSIS  The diagnosis is made by 3 separate blood pressure measurements. They are taken at least 1 week apart from each other. If there is organ damage from hypertension, the diagnosis may be made without repeat measurements. Hypertension is usually  identified by having blood pressure readings:  Above 140/90 mmHg measured in both arms, at 3 separate times, over a couple weeks.   Over 130/80 mmHg should be considered a risk factor and may require treatment in patients with diabetes.  Blood pressure readings over 120/80 mmHg are called "pre-hypertension" even in non-diabetic patients. To get a true blood pressure measurement, use the following guidelines. Be aware of the factors that can alter blood pressure readings.  Take measurements at least 1 hour after caffeine.   Take measurements 30 minutes after smoking and without any stress. This is another reason to quit smoking - it raises your blood pressure.   Use a proper cuff size. Ask your caregiver if you are not sure about your cuff size.   Most home blood pressure cuffs are automatic. They will measure systolic and diastolic pressures. The systolic pressure is the pressure reading at the start of sounds. Diastolic pressure is the pressure at which the sounds disappear. If you are elderly, measure pressures in multiple postures. Try sitting, lying or standing.   Sit at rest for a minimum of 5 minutes before taking measurements.   You should not be on any medications like decongestants. These are found in many cold medications.   Record your blood pressure readings and review them with your caregiver.  If you have hypertension:  Your caregiver may do tests to be sure you do not have secondary hypertension (see "causes" above).   Your caregiver may also look for signs of metabolic syndrome. This is also called Syndrome X or Insulin Resistance Syndrome. You may have this syndrome if you have type 2 diabetes, abdominal obesity, and abnormal blood lipids in addition to hypertension.   Your caregiver will take your medical and family history and perform a physical exam.   Diagnostic tests may include blood tests (for glucose, cholesterol, potassium, and kidney function), a urinalysis, or  an EKG. Other tests may also be necessary depending on your condition.  PREVENTION  There are important lifestyle issues that you can adopt to reduce your chance of developing hypertension:  Maintain a normal weight.   Limit the amount of salt (sodium) in your diet.   Exercise often.   Limit alcohol intake.   Get enough potassium in your diet. Discuss specific advice with your caregiver.   Follow a DASH diet (dietary approaches to stop hypertension). This diet is rich in fruits, vegetables, and low-fat dairy products, and avoids certain fats.  PROGNOSIS  Essential hypertension cannot be cured. Lifestyle changes and medical treatment can lower blood pressure and reduce complications. The prognosis of secondary hypertension depends on the underlying cause. Many people whose hypertension is controlled with medicine or lifestyle changes can live a normal, healthy life.  RISKS AND COMPLICATIONS  While high blood pressure alone is not an illness, it often requires treatment due to its short- and long-term effects on many organs. Hypertension increases your risk for:  CVAs or strokes (cerebrovascular accident).   Heart failure due to chronically high blood pressure (hypertensive cardiomyopathy).   Heart attack (myocardial infarction).     Damage to the retina (hypertensive retinopathy).   Kidney failure (hypertensive nephropathy).  Your caregiver can explain list items above that apply to you. Treatment of hypertension can significantly reduce the risk of complications. TREATMENT   For overweight patients, weight loss and regular exercise are recommended. Physical fitness lowers blood pressure.   Mild hypertension is usually treated with diet and exercise. A diet rich in fruits and vegetables, fat-free dairy products, and foods low in fat and salt (sodium) can help lower blood pressure. Decreasing salt intake decreases blood pressure in a 1/3 of people.   Stop smoking if you are a smoker.    The steps above are highly effective in reducing blood pressure. While these actions are easy to suggest, they are difficult to achieve. Most patients with moderate or severe hypertension end up requiring medications to bring their blood pressure down to a normal level. There are several classes of medications for treatment. Blood pressure pills (antihypertensives) will lower blood pressure by their different actions. Lowering the blood pressure by 10 mmHg may decrease the risk of complications by as much as 25%. The goal of treatment is effective blood pressure control. This will reduce your risk for complications. Your caregiver will help you determine the best treatment for you according to your lifestyle. What is excellent treatment for one person, may not be for you. HOME CARE INSTRUCTIONS   Do not smoke.   Follow the lifestyle changes outlined in the "Prevention" section.   If you are on medications, follow the directions carefully. Blood pressure medications must be taken as prescribed. Skipping doses reduces their benefit. It also puts you at risk for problems.   Follow up with your caregiver, as directed.   If you are asked to monitor your blood pressure at home, follow the guidelines in the "Diagnosis" section above.  SEEK MEDICAL CARE IF:   You think you are having medication side effects.   You have recurrent headaches or lightheadedness.   You have swelling in your ankles.   You have trouble with your vision.  SEEK IMMEDIATE MEDICAL CARE IF:   You have sudden onset of chest pain or pressure, difficulty breathing, or other symptoms of a heart attack.   You have a severe headache.   You have symptoms of a stroke (such as sudden weakness, difficulty speaking, difficulty walking).  MAKE SURE YOU:   Understand these instructions.   Will watch your condition.   Will get help right away if you are not doing well or get worse.  Document Released: 06/13/2005 Document  Revised: 06/02/2011 Document Reviewed: 01/11/2007 [employer] Patient Information 2012 [employer]. 

## 2011-11-20 NOTE — ED Notes (Signed)
Pt c/o headache that she states began 1 week ago. Pt states hx of migraines. Pt does c/o nausea but denies vomiting.

## 2011-11-20 NOTE — ED Provider Notes (Signed)
History     CSN: [id number]  Arrival date & time 11/20/11  0705   First MD Initiated Contact with Patient 11/20/11 [phone]      Chief Complaint  Patient presents with  . Headache    (Consider location/radiation/quality/duration/timing/severity/associated sxs/prior treatment) HPI Comments: Mackenzie Patterson is a 41 y.o. Female who states she has had a persistent headache for 1 week. She has tried Ibuprofen without relief. The headche is located in the frontal region. His associated earache, sore throat , or nasal drainage. No fever, chills, cough, shortness of breath, back pain, weakness or paresthesias. She saw her doctor this week, and was put on ExForge, for HTN, and told her HA was due to the HTN.   Patient is a 41 y.o. female presenting with headaches. The history is provided by the patient.  Headache     Past Medical History  Diagnosis Date  . Hypertension     Past Surgical History  Procedure Date  . Tubal ligation     History reviewed. No pertinent family history.  History  Substance Use Topics  . Smoking status: Never Smoker   . Smokeless tobacco: Never Used  . Alcohol Use: No    OB History    Grav Para Term Preterm Abortions TAB SAB Ect Mult Living                  Review of Systems  Neurological: Positive for headaches.  All other systems reviewed and are negative.    Allergies  Penicillins  Home Medications   Current Outpatient Rx  Name Route Sig Dispense Refill  . ALBUTEROL SULFATE HFA 108 (90 BASE) MCG/ACT IN AERS Inhalation Inhale 2 puffs into the lungs every 6 (six) hours as needed. Wheezing and shortness of breath    . AMLODIPINE BESYLATE-VALSARTAN 10-160 MG PO TABS Oral Take 1 tablet by mouth daily.    . IBUPROFEN 200 MG PO TABS Oral Take 600 mg by mouth every 6 (six) hours as needed. For pain    . NORETHIN-ETH ESTRAD-FE BIPHAS 1 MG-10 MCG / 10 MCG PO TABS Oral Take 1 tablet by mouth daily.      . OXYCODONE-ACETAMINOPHEN 5-325 MG PO TABS Oral  Take 1 tablet by mouth every 4 (four) hours as needed for pain. 10 tablet 0    BP 125/72  Pulse 78  Temp(Src) 98.1 F (36.7 C) (Oral)  Resp 16  SpO2 99%  Physical Exam  Nursing note and vitals reviewed. Constitutional: She is oriented to person, place, and time. She appears well-developed and well-nourished.  HENT:  Head: Normocephalic and atraumatic.  Eyes: Conjunctivae and EOM are normal. Pupils are equal, round, and reactive to light.  Neck: Normal range of motion and phonation normal. Neck supple. No thyromegaly present.       No meningismus  Cardiovascular: Normal rate, regular rhythm and intact distal pulses.   Pulmonary/Chest: Effort normal and breath sounds normal. She exhibits no tenderness.  Abdominal: Soft. She exhibits no distension. There is no tenderness. There is no guarding.  Musculoskeletal: Normal range of motion.  Neurological: She is alert and oriented to person, place, and time. She has normal strength. No cranial nerve deficit. She exhibits normal muscle tone. Coordination normal.       No ataxia.  Skin: Skin is warm and dry.  Psychiatric: Her behavior is normal. Judgment and thought content normal.       She appears depressed.    ED Course  Procedures (including critical care  time)  Emergency department treatment: Percocet \ Vital signs, recheck-  blood pressure 125/72  Labs Reviewed  POCT I-STAT, CHEM 8 - Abnormal; Notable for the following:    Glucose, Bld 117 (*)    All other components within normal limits   No results found.   1. Head ache   2. Hypertension       MDM  Nonspecific headache, without worrisome findings. Doubt CVA, cranial venous thrombosis. Doubt metabolic instability, serious bacterial infection or impending vascular collapse; the patient is stable for discharge.  Plan: Home Medications- Percocet # 10; Home Treatments- rest; Recommended follow up- PCP f/u in 3-4 days.        Flint Melter, MD 11/20/11 [phone]

## 2011-11-20 NOTE — ED Notes (Signed)
Patient given  ginger ale and crackers per her request states the pain medication has made her a little nauseated.

## 2011-11-20 NOTE — ED Notes (Signed)
Unable to draw labs at this time.  Pt doesn't have stickers to label blood at this time.

## 2011-11-21 ENCOUNTER — Encounter (HOSPITAL_COMMUNITY): Payer: Self-pay | Admitting: *Deleted

## 2011-11-21 ENCOUNTER — Emergency Department (HOSPITAL_COMMUNITY)
Admission: EM | Admit: 2011-11-21 | Discharge: 2011-11-21 | Disposition: A | Discharge status: Home / Self Care | Payer: Medicaid Other | Attending: Emergency Medicine | Admitting: Emergency Medicine

## 2011-11-21 ENCOUNTER — Emergency Department (HOSPITAL_COMMUNITY): Payer: Medicaid Other

## 2011-11-21 DIAGNOSIS — R51 Headache: Secondary | ICD-10-CM

## 2011-11-21 DIAGNOSIS — Z79899 Other long term (current) drug therapy: Secondary | ICD-10-CM | POA: Insufficient documentation

## 2011-11-21 DIAGNOSIS — I10 Essential (primary) hypertension: Secondary | ICD-10-CM | POA: Insufficient documentation

## 2011-11-21 LAB — CBC
MCH: 31.5 pg (ref 26.0–34.0)
Platelets: 134 10*3/uL — ABNORMAL LOW (ref 150–400)
RBC: 4.79 MIL/uL (ref 3.87–5.11)
RDW: 12.1 % (ref 11.5–15.5)
WBC: 4 10*3/uL (ref 4.0–10.5)

## 2011-11-21 LAB — DIFFERENTIAL
Basophils Absolute: 0 10*3/uL (ref 0.0–0.1)
Eosinophils Absolute: 0.1 10*3/uL (ref 0.0–0.7)
Eosinophils Relative: 3 % (ref 0–5)
Lymphs Abs: 1.5 10*3/uL (ref 0.7–4.0)
Neutrophils Relative %: 49 % (ref 43–77)

## 2011-11-21 LAB — BASIC METABOLIC PANEL
Calcium: 8.7 mg/dL (ref 8.4–10.5)
GFR calc Af Amer: >90 mL/min (ref >90)
GFR calc non Af Amer: >90 mL/min (ref >90)
Glucose, Bld: 105 mg/dL — ABNORMAL HIGH (ref 70–99)
Potassium: 4.1 mEq/L (ref 3.5–5.1)
Sodium: 134 mEq/L — ABNORMAL LOW (ref 135–145)

## 2011-11-21 LAB — PROTIME-INR
INR: 0.99 (ref 0.00–1.49)
Prothrombin Time: 13.3 seconds (ref 11.6–15.2)

## 2011-11-21 MED ORDER — LORAZEPAM 1 MG PO TABS: 1.0000 mg | ORAL_TABLET | Freq: Three times a day (TID) | ORAL | Status: AC | PRN

## 2011-11-21 NOTE — ED Notes (Signed)
Pt states "I was here yesterday, had this h/a for a wk, the medicine he gave me yesterday eased the pain but has not taken away."

## 2011-11-21 NOTE — ED Provider Notes (Signed)
History     CSN: [id number]  Arrival date & time 11/21/11  7829   First MD Initiated Contact with Patient 11/21/11 [phone]      Chief Complaint  Patient presents with  . Headache    (Consider location/radiation/quality/duration/timing/severity/associated sxs/prior treatment) HPI Comments: Mackenzie Patterson is a 41 y.o. Female who has had a persistent headache for 8 days. She was seen by me yesterday and evaluated and discharged. She was prescribed oxycodone, and has minimal and transient improvement, when she takes a pill. She was seen by her PCP about 5 days ago, and placed on Exforge for hypertension. She denies fever, chills, nausea, vomiting, blurred vision, abdominal or chest pain. She has no problem ambulating. She does not feel weak or have paresthesias. She states that she has been under stress due to to her son who is 29 years old. He has not been communicating with her or following her directions.  Patient is a 41 y.o. female presenting with headaches. The history is provided by the patient.  Headache     Past Medical History  Diagnosis Date  . Hypertension     Past Surgical History  Procedure Date  . Tubal ligation     No family history on file.  History  Substance Use Topics  . Smoking status: Never Smoker   . Smokeless tobacco: Never Used  . Alcohol Use: No    OB History    Grav Para Term Preterm Abortions TAB SAB Ect Mult Living                  Review of Systems  Neurological: Positive for headaches.  All other systems reviewed and are negative.    Allergies  Penicillins  Home Medications   Current Outpatient Rx  Name Route Sig Dispense Refill  . ALBUTEROL SULFATE HFA 108 (90 BASE) MCG/ACT IN AERS Inhalation Inhale 2 puffs into the lungs every 6 (six) hours as needed. Wheezing and shortness of breath    . AMLODIPINE BESYLATE-VALSARTAN 10-160 MG PO TABS Oral Take 1 tablet by mouth daily.    . IBUPROFEN 200 MG PO TABS Oral Take 600 mg by mouth every  8 (eight) hours as needed. For pain    . NORETHIN-ETH ESTRAD-FE BIPHAS 1 MG-10 MCG / 10 MCG PO TABS Oral Take 1 tablet by mouth daily.      . OXYCODONE-ACETAMINOPHEN 5-325 MG PO TABS Oral Take 1 tablet by mouth every 4 (four) hours as needed for pain. 10 tablet 0  . LORAZEPAM 1 MG PO TABS Oral Take 1 tablet (1 mg total) by mouth 3 (three) times daily as needed (stress and tension). 15 tablet 0    BP 118/71  Pulse 82  Temp(Src) 99 F (37.2 C) (Oral)  Resp 16  Wt 164 lb (74.39 kg)  SpO2 100%  LMP 11/17/2011  Physical Exam  Nursing note and vitals reviewed. Constitutional: She is oriented to person, place, and time. She appears well-developed and well-nourished.  HENT:  Head: Normocephalic and atraumatic.  Eyes: Conjunctivae and EOM are normal. Pupils are equal, round, and reactive to light.  Neck: Normal range of motion and phonation normal. Neck supple.  Cardiovascular: Normal rate, regular rhythm and intact distal pulses.   Pulmonary/Chest: Effort normal and breath sounds normal. She exhibits no tenderness.  Abdominal: Soft. She exhibits no distension. There is no tenderness. There is no guarding.  Musculoskeletal: Normal range of motion.  Neurological: She is alert and oriented to person, place, and time.  She has normal strength. She exhibits normal muscle tone.  Skin: Skin is warm and dry.  Psychiatric: Her behavior is normal. Judgment and thought content normal.       She appears depressed    ED Course  Procedures (including critical care time)  Concern for cerebral venous thrombosis, is present; MR brain ordered.   Labs Reviewed  CBC - Abnormal; Notable for the following:    Hemoglobin 15.1 (*)    Platelets 134 (*)    All other components within normal limits  BASIC METABOLIC PANEL - Abnormal; Notable for the following:    Sodium 134 (*)    Glucose, Bld 105 (*)    All other components within normal limits  DIFFERENTIAL  PROTIME-INR  LAB REPORT - SCANNED   Mr Maxine Glenn  Head Wo Contrast  11/21/2011  *RADIOLOGY REPORT*  Clinical Data: Headache.  Question cerebral venous thrombosis.  MRA HEAD WITHOUT CONTRAST  Technique: Angiographic images of the Circle of Willis were obtained using MRA technique without intravenous contrast.  Comparison: Head CT 01/19/2007  Findings: Sagittal sinus is patent and normal.  Transverse sinuses are patent and normal.  Sigmoid sinuses are patent and normal. There is typical signal loss at the proximal aspect of both transverse sinuses in the horizontal portions of the sigmoid sinuses related to in phase flow. Deep venous system appears patent and normal.  IMPRESSION: No evidence of intracranial venous thrombosis.  Original Report Authenticated By: Thomasenia Sales, M.D.     1. Headache       MDM  Likely stress related HA. Doubt significant depression, domestic abuse, intracranial abnormality. Doubt metabolic instability, serious bacterial infection or impending vascular collapse; the patient is stable for discharge.  Plan: Home Medications- Ativan; Home Treatments- rest; Recommended follow up- pcp PRN        Flint Melter, MD 11/23/11 [phone]

## 2011-11-21 NOTE — ED Notes (Signed)
Here yesterday for headache. Returns today for worsening headache. Took Vicodin yesterday but none today.

## 2011-11-21 NOTE — Discharge Instructions (Signed)
There was no clear cause for your headache. Call your Dr. for a followup appointment in 2 or 3 days. If you feel like you're depressed or your symptoms worsen please return here to be checked. Try to prescribe medication to help her relax. Try to avoid taking the pain medication prescribed yesterday.   General Headache, Without Cause A general headache has no specific cause. These headaches are not life-threatening. They will not lead to other types of headaches. HOME CARE   Make and keep follow-up visits with your doctor.   Only take medicine as told by your doctor.   Try to relax, get a massage, or use your thoughts to control your body (biofeedback).   Apply cold or heat to the head and neck. Apply 3 or 4 times a day or as needed.  Finding out the results of your test Ask when your test results will be ready. Make sure you get your test results. GET HELP RIGHT AWAY IF:   You have problems with medicine.   Your medicine does not help relieve pain.   Your headache changes or becomes worse.   You feel sick to your stomach (nauseous) or throw up (vomit).   You have a temperature by mouth above 102 F (38.9 C), not controlled by medicine.   Your have a stiff neck.   You have vision loss.   You have muscle weakness.   You lose control of your muscles.   You lose balance or have trouble walking.   You feel like you are going to pass out (faint).  MAKE SURE YOU:   Understand these instructions.   Will watch this condition.   Will get help right away if you are not doing well or get worse.  Document Released: 03/22/2008 Document Revised: 06/02/2011 Document Reviewed: 03/22/2008 [facility] Patient Information 2012 [place], [place].

## 2011-11-21 NOTE — ED Notes (Addendum)
Remains in MRI 

## 2011-11-23 ENCOUNTER — Other Ambulatory Visit: Payer: Self-pay | Admitting: Cardiology

## 2011-11-30 ENCOUNTER — Ambulatory Visit
Admission: RE | Admit: 2011-11-30 | Discharge: 2011-11-30 | Disposition: A | Discharge status: Home / Self Care | Payer: Medicaid Other | Source: Ambulatory Visit | Attending: Cardiology | Admitting: Cardiology

## 2012-01-02 ENCOUNTER — Other Ambulatory Visit: Payer: Self-pay | Admitting: Cardiology

## 2012-01-10 ENCOUNTER — Other Ambulatory Visit: Payer: Self-pay | Admitting: Cardiology

## 2012-01-10 DIAGNOSIS — N63 Unspecified lump in unspecified breast: Secondary | ICD-10-CM

## 2012-01-11 ENCOUNTER — Other Ambulatory Visit: Payer: Self-pay | Admitting: Cardiology

## 2012-01-11 DIAGNOSIS — N63 Unspecified lump in unspecified breast: Secondary | ICD-10-CM

## 2012-01-17 ENCOUNTER — Other Ambulatory Visit: Payer: Medicaid Other

## 2012-01-17 ENCOUNTER — Ambulatory Visit
Admission: RE | Admit: 2012-01-17 | Discharge: 2012-01-17 | Disposition: A | Discharge status: Home / Self Care | Payer: Medicaid Other | Source: Ambulatory Visit | Attending: Cardiology | Admitting: Cardiology

## 2012-01-17 DIAGNOSIS — N63 Unspecified lump in unspecified breast: Secondary | ICD-10-CM

## 2012-02-14 ENCOUNTER — Encounter: Payer: Self-pay | Admitting: *Deleted

## 2012-02-14 ENCOUNTER — Encounter: Payer: Medicaid Other | Attending: Cardiology | Admitting: *Deleted

## 2012-02-14 VITALS — Ht 59.0 in | Wt 166.6 lb

## 2012-02-14 DIAGNOSIS — Z713 Dietary counseling and surveillance: Secondary | ICD-10-CM | POA: Insufficient documentation

## 2012-02-14 DIAGNOSIS — E669 Obesity, unspecified: Secondary | ICD-10-CM | POA: Insufficient documentation

## 2012-02-14 DIAGNOSIS — I10 Essential (primary) hypertension: Secondary | ICD-10-CM | POA: Insufficient documentation

## 2012-02-14 NOTE — Progress Notes (Signed)
  Medical Nutrition Therapy:  Appt start time: 0830 end time:  0930.   Assessment:  Primary concerns today: obesity and HTN.   MEDICATIONS: see list   DIETARY INTAKE:  Usual eating pattern includes 2 meals and 0 snacks per day.  Everyday foods include fast food.  Avoided foods include breads, doesn't eat much vegetables.    24-hr recall:  B ( AM): skips  Snk ( AM): dry cereal (fruit loops or other sugary cereal)  L ( PM): MCdonald's fish sandwich or happy meal with sprite; salad from wendy's; hardee's hot ham and cheese with fries and sprite; chipotle burrito Snk ( PM): none D ( PM): roasted chicken bites from [employer]jangles and sonic slushee Snk ( PM): none Beverages: soda  Usual physical activity: none  Estimated energy needs: 1200 calories 135 g carbohydrates 90 g protein 33 g fat  Progress Towards Goal(s):  In progress.   Nutritional Diagnosis:  NB-1.1 Food and nutrition-related knowledge deficit As related to proper balanced diet of fruits, vegetables, lean proteins.  As evidenced by HTN and obesity.    Intervention:  Nutrition counseling provided.  Mackenzie Patterson is obese, with HTN, and elevated fasting glucose.  Discussed eating 3 meals/day and to avoid meal skipping.  Discussed MyPlate for meal planning.  She doesn't work and has the time to prepare meals, she just sleeps most of the day and then gets fast food.  She's picky about the foods she likes.  She complains of feeling tired all the time.  Discussed how proper nutrition (fruits, vegetables, lean proteins, fiber) gives [name] energy, but foods high in fat make [name] tired.  Encouraged eating more at home for weight management and for sodium reduction.  Encouraged more water for better hydration.  Discussed portion control.  Also encouraged moderate activity.   Mackenzie Patterson complains of heart condition- gave information about chair exercises and increasing normal activities in life: walking to restaurant rather than doing drive through, parking  farther away, taking stairs instead of elevator, etc.  Discouraged instant foods, canned foods, processed metas, and other high sodium foods  Handouts given during visit include:  Eating out  Reading food labels  Portion control and myplate   Chair exercises  Monitoring/Evaluation:  Dietary intake, exercise, and body weight in 1 month(s).

## 2012-02-14 NOTE — Patient Instructions (Addendum)
Goals:  Eat 3 meals/day, Avoid meal skipping   Increase protein rich foods  Follow "Plate Method" for portion control  Limit carbohydrate1-2 servings/meal   Choose more whole grains, lean protein, low-fat dairy, and fruits/non-starchy vegetables.   Aim for 30-45 min of physical activity daily: ok to break into chunks  Limit sugar-sweetened beverages and concentrated sweets  Limit sodas and sports drinks- drink more water with sugar-free Hawaiian punch or crystal light  Look for calorie king app for phone or  www.calorieking.com

## 2012-03-14 ENCOUNTER — Emergency Department (HOSPITAL_COMMUNITY)
Admission: EM | Admit: 2012-03-14 | Discharge: 2012-03-14 | Disposition: A | Discharge status: Home / Self Care | Payer: Medicaid Other | Source: Home / Self Care | Attending: Family Medicine | Admitting: Family Medicine

## 2012-03-14 ENCOUNTER — Encounter (HOSPITAL_COMMUNITY): Payer: Self-pay

## 2012-03-14 DIAGNOSIS — J45901 Unspecified asthma with (acute) exacerbation: Secondary | ICD-10-CM

## 2012-03-14 MED ORDER — HYDROCODONE-CHLORPHENIRAMINE 5-4 MG/5ML PO SOLN: 5.0000 mL | Freq: Three times a day (TID) | ORAL | Status: DC | PRN

## 2012-03-14 MED ORDER — BENZONATATE 100 MG PO CAPS: 100.0000 mg | ORAL_CAPSULE | Freq: Three times a day (TID) | ORAL | Status: DC

## 2012-03-14 MED ORDER — PREDNISONE 20 MG PO TABS: ORAL_TABLET | ORAL | Status: DC

## 2012-03-14 MED ORDER — AZITHROMYCIN 250 MG PO TABS: 250.0000 mg | ORAL_TABLET | Freq: Every day | ORAL | Status: DC

## 2012-03-14 NOTE — ED Provider Notes (Signed)
History     CSN: [id number]  Arrival date & time 03/14/12  0805   First MD Initiated Contact with Patient 03/14/12 0809      Chief Complaint  Patient presents with  . Cough    (Consider location/radiation/quality/duration/timing/severity/associated sxs/prior treatment) HPI Comments: 41 year old female nonsmoker with history of HTN and asthma. Here complaining of 3 weeks with dry nonproductive persistent cough. Denies fever or chills. Appetite is good. Feels tired from coughing. Denies sore throat or nasal congestion. Denies chest pain although her upper abdomen is sore from coughing. Reports intermittent wheezing and is using her albuterol nebulizations at least 4 times a day which is an increase for her. Denies acid reflux. No close sick contacts.   Past Medical History  Diagnosis Date  . Hypertension   . Obesity     Past Surgical History  Procedure Date  . Tubal ligation     Family History  Problem Relation Age of Onset  . Cancer Other   . Hyperlipidemia Other   . Hypertension Other     History  Substance Use Topics  . Smoking status: Never Smoker   . Smokeless tobacco: Never Used  . Alcohol Use: No    OB History    Grav Para Term Preterm Abortions TAB SAB Ect Mult Living                  Review of Systems  Constitutional: Negative for fever, chills, activity change and appetite change.  HENT: Negative for nosebleeds, congestion, sore throat and trouble swallowing.   Eyes: Negative for discharge.  Respiratory: Positive for cough and wheezing.   Cardiovascular: Negative for chest pain and leg swelling.  Musculoskeletal: Negative for myalgias and arthralgias.  Skin: Negative for rash.  Neurological: Negative for dizziness and headaches.  All other systems reviewed and are negative.    Allergies  Penicillins  Home Medications   Current Outpatient Rx  Name Route Sig Dispense Refill  . ALBUTEROL SULFATE HFA 108 (90 BASE) MCG/ACT IN AERS Inhalation  Inhale 2 puffs into the lungs every 6 (six) hours as needed. Wheezing and shortness of breath    . LISINOPRIL PO Oral Take by mouth daily.    Kathrynn Running ESTRAD-FE BIPHAS 1 MG-10 MCG / 10 MCG PO TABS Oral Take 1 tablet by mouth daily.     . TRIAMCINOLONE ACETONIDE 0.1 % EX CREA Topical Apply topically daily.    Marland Kitchen AMLODIPINE BESYLATE-VALSARTAN 10-160 MG PO TABS Oral Take 1 tablet by mouth daily.    . AZITHROMYCIN 250 MG PO TABS Oral Take 1 tablet (250 mg total) by mouth daily. 6 tablet 0  . BENZONATATE 100 MG PO CAPS Oral Take 1 capsule (100 mg total) by mouth every 8 (eight) hours. 21 capsule 0  . HYDROCODONE-CHLORPHENIRAMINE 5-4 MG/5ML PO SOLN Oral Take 5 mLs by mouth 3 (three) times daily as needed. 120 mL 0  . IBUPROFEN 200 MG PO TABS Oral Take 600 mg by mouth every 8 (eight) hours as needed. For pain    . PREDNISONE 20 MG PO TABS  2 tabs by mouth daily for 5 days. 10 tablet 0    BP 139/97  Pulse 90  Temp 98.6 F (37 C) (Oral)  Resp 18  SpO2 98%  LMP 03/12/2012  Physical Exam  Nursing note and vitals reviewed. Constitutional: She appears well-developed and well-nourished.  HENT:  Head: Normocephalic and atraumatic.  Mouth/Throat: Oropharynx is clear and moist. No oropharyngeal exudate.  Eyes: Conjunctivae normal are  normal.  Neck: Neck supple. No JVD present. No thyromegaly present.  Cardiovascular: Normal rate, regular rhythm and normal heart sounds.   Pulmonary/Chest: Effort normal.       Few scattered bilateral rhonchi, no rales, no active wheezing. No tachypnea. No orthopnea.  Lymphadenopathy:    She has no cervical adenopathy.    ED Course  Procedures (including critical care time)  Labs Reviewed - No data to display No results found.   1. Asthma exacerbation       MDM  Impress asthma exacerbation prescribed prednisone, hydrocodone/chlorpheniramine, azithromycin. Asked to continue on albuterol nebs every 6 hours as needed. Return if worsening or no  improvement of her symptoms despite following treatment. Asked to followup with her primary care provider if persistent cough as there is a possibility it could be related to ACE inhibitor chronic therapy.        Sharin Grave, MD 03/16/12 1205

## 2012-03-14 NOTE — ED Notes (Signed)
C/o dry cough for 3 weeks.  Denies other cold sx or fever.  Reports abdomen and chest are sore from coughing

## 2012-03-19 ENCOUNTER — Encounter: Payer: Medicaid Other | Attending: Cardiology | Admitting: *Deleted

## 2012-03-19 DIAGNOSIS — I10 Essential (primary) hypertension: Secondary | ICD-10-CM

## 2012-03-19 DIAGNOSIS — Z713 Dietary counseling and surveillance: Secondary | ICD-10-CM | POA: Insufficient documentation

## 2012-03-19 DIAGNOSIS — E669 Obesity, unspecified: Secondary | ICD-10-CM

## 2012-03-19 NOTE — Progress Notes (Signed)
  Medical Nutrition Therapy:  Appt start time: 1030 end time:  1100.   Assessment:  Primary concerns today: HTN and obesity follow up.   MEDICATIONS: see list   DIETARY INTAKE:  Usual eating pattern includes 3 meals and 0-2 snacks per day.  Everyday foods include starches, proteins, fruits, some vegetables.  Avoided foods include none.    24-hr recall:  B ( AM): cereal  msot days or bacon and eggs sometiems Snk ( AM): none usually  L ( PM): 6-in tuna sub, cold cut sub Snk ( PM): Nabs D ( PM): greens, mac-n -cheese and hamberger casserole, pineapple and water with reduced fat chips or grilled chicken Snk ( PM): applecause Beverages: water with sugar-free  Usual physical activity: runs up 46 stairs 3 times every day.  Does exercise routine at home most days as well.    Estimated energy needs: 1400 calories 158 g carbohydrates 105 g protein 39 g fat  Progress Towards Goal(s):  Some progress.   Nutritional Diagnosis:  NB-1.1 Food and nutrition-related knowledge deficit As related to proper balanced diet of fruits, vegetables, lean proteins. As evidenced by HTN and obesity    Intervention:  Nutrition counseling provided.  Mackenzie Patterson has made great progress towards her goals.  She exercises daily, has cut back on fast food and is preparing more meals at home.  She is eating 3 meals a day, cut back on sugary drinks and fattening snacks, and has lost 2 pounds.  She had made many lifestyle changes and has started reading food labels for calories and sugars.  Applauded her for her accomplishments and encouraged her to keep up the good work.  Encouraged as much freshly prepared foods as possible and to read food labels for sodium as well.    Handouts given during visit include:  Reading food labels  Monitoring/Evaluation:  Dietary intake, exercise, and body weight in 6 month(s).

## 2012-03-27 ENCOUNTER — Other Ambulatory Visit: Payer: Self-pay | Admitting: Obstetrics and Gynecology

## 2012-03-27 NOTE — Telephone Encounter (Signed)
Medication refill

## 2012-03-28 ENCOUNTER — Other Ambulatory Visit: Payer: Self-pay

## 2012-03-28 MED ORDER — NORETHIN ACE-ETH ESTRAD-FE 1-20 MG-MCG(24) PO CHEW: 1.0000 | CHEWABLE_TABLET | Freq: Every day | ORAL | Status: DC

## 2012-03-28 NOTE — Telephone Encounter (Signed)
Spoke with pt rgd msg pt states bc no longer in stock advised pt will send new rx to pharm double up on bc to catch up on missed days pt voice understading

## 2012-03-31 ENCOUNTER — Emergency Department (HOSPITAL_COMMUNITY): Payer: Medicaid Other

## 2012-03-31 ENCOUNTER — Emergency Department (HOSPITAL_COMMUNITY)
Admission: EM | Admit: 2012-03-31 | Discharge: 2012-03-31 | Disposition: A | Discharge status: Home / Self Care | Payer: Medicaid Other | Attending: Emergency Medicine | Admitting: Emergency Medicine

## 2012-03-31 ENCOUNTER — Encounter (HOSPITAL_COMMUNITY): Payer: Self-pay | Admitting: Emergency Medicine

## 2012-03-31 DIAGNOSIS — R059 Cough, unspecified: Secondary | ICD-10-CM | POA: Insufficient documentation

## 2012-03-31 DIAGNOSIS — R05 Cough: Secondary | ICD-10-CM | POA: Insufficient documentation

## 2012-03-31 DIAGNOSIS — J45909 Unspecified asthma, uncomplicated: Secondary | ICD-10-CM | POA: Insufficient documentation

## 2012-03-31 DIAGNOSIS — Z880 Allergy status to penicillin: Secondary | ICD-10-CM | POA: Insufficient documentation

## 2012-03-31 DIAGNOSIS — I10 Essential (primary) hypertension: Secondary | ICD-10-CM | POA: Insufficient documentation

## 2012-03-31 MED ORDER — GI COCKTAIL ~~LOC~~
30.0000 mL | Freq: Once | ORAL | Status: AC
  Administered 2012-03-31: 30 mL via ORAL
  Filled 2012-03-31: qty 30

## 2012-03-31 MED ORDER — OMEPRAZOLE 20 MG PO CPDR: 20.0000 mg | DELAYED_RELEASE_CAPSULE | Freq: Every day | ORAL | Status: DC

## 2012-03-31 NOTE — ED Notes (Signed)
MD at bedside. 

## 2012-03-31 NOTE — ED Provider Notes (Signed)
Medical screening examination/treatment/procedure(s) were performed by non-physician practitioner and as supervising physician I was immediately available for consultation/collaboration.   Lyanne Co, MD 03/31/12 [phone]

## 2012-03-31 NOTE — ED Notes (Signed)
Pt presents w/ cough form one month and four days. T&R at urgent care on 9/18 medications she received did not help  Came today because she is tired of coughing, can't sleep, coughs up clear substance at time. Slight headache off and on. Denies fever, ear pain, or sore throat

## 2012-03-31 NOTE — ED Provider Notes (Signed)
History     CSN: [id number]  Arrival date & time 03/31/12  4098   First MD Initiated Contact with Patient 03/31/12 0809      Chief Complaint  Patient presents with  . Cough    (Consider location/radiation/quality/duration/timing/severity/associated sxs/prior treatment) Patient is a 41 y.o. female presenting with cough. The history is provided by the patient.  Cough This is a chronic problem. The current episode started more than 1 week ago. The problem occurs every few minutes. The cough is non-productive. There has been no fever. Pertinent negatives include no chest pain, no chills, no sore throat and no shortness of breath. Associated symptoms comments: Dry hacking cough for over one month. She was seen at Urgent Care on 03/14/12 and given abx, steroids and cough medication without relief or change in symptoms. She has a history of asthma but denies wheezing, and reports no change in cough with inhaler use. The cough becomes worse when she lies down. She denies nausea, vomiting or history of reflux. No chest pain..    Past Medical History  Diagnosis Date  . Hypertension   . Obesity     Past Surgical History  Procedure Date  . Tubal ligation     Family History  Problem Relation Age of Onset  . Cancer Other   . Hyperlipidemia Other   . Hypertension Other     History  Substance Use Topics  . Smoking status: Never Smoker   . Smokeless tobacco: Never Used  . Alcohol Use: No    OB History    Grav Para Term Preterm Abortions TAB SAB Ect Mult Living                  Review of Systems  Constitutional: Negative for fever and chills.  HENT: Negative.  Negative for congestion and sore throat.   Respiratory: Positive for cough. Negative for shortness of breath.   Cardiovascular: Negative.  Negative for chest pain.  Gastrointestinal: Negative.  Negative for abdominal pain.  Musculoskeletal: Negative.   Skin: Negative.   Neurological: Negative.     Allergies    Penicillins  Home Medications   Current Outpatient Rx  Name Route Sig Dispense Refill  . ALBUTEROL SULFATE HFA 108 (90 BASE) MCG/ACT IN AERS Inhalation Inhale 2 puffs into the lungs every 6 (six) hours as needed. Wheezing and shortness of breath    . AMLODIPINE BESYLATE-VALSARTAN 10-160 MG PO TABS Oral Take 1 tablet by mouth daily.    . AZITHROMYCIN 250 MG PO TABS Oral Take 1 tablet (250 mg total) by mouth daily. 6 tablet 0  . BENZONATATE 100 MG PO CAPS Oral Take 1 capsule (100 mg total) by mouth every 8 (eight) hours. 21 capsule 0  . HYDROCODONE-CHLORPHENIRAMINE 5-4 MG/5ML PO SOLN Oral Take 5 mLs by mouth 3 (three) times daily as needed. 120 mL 0  . IBUPROFEN 200 MG PO TABS Oral Take 600 mg by mouth every 8 (eight) hours as needed. For pain    . LISINOPRIL PO Oral Take by mouth daily.    Azzie Roup ACE-ETH ESTRAD-FE 1-20 MG-MCG(24) PO CHEW Oral Chew 1 tablet by mouth daily. 28 tablet 6    Pt to use discount card  BIN# [id number] PCN#CN GRP ...  . NORETHIN-ETH ESTRAD-FE BIPHAS 1 MG-10 MCG / 10 MCG PO TABS Oral Take 1 tablet by mouth daily.     Marland Kitchen PREDNISONE 20 MG PO TABS  2 tabs by mouth daily for 5 days. 10 tablet 0  .  TRIAMCINOLONE ACETONIDE 0.1 % EX CREA Topical Apply topically daily.      BP 139/85  Pulse 96  Temp 98.5 F (36.9 C) (Oral)  SpO2 99%  LMP 03/12/2012  Physical Exam  Constitutional: She is oriented to person, place, and time. She appears well-developed and well-nourished.  HENT:  Head: Normocephalic.  Neck: Normal range of motion. Neck supple.  Cardiovascular: Normal rate and regular rhythm.   Pulmonary/Chest: Effort normal and breath sounds normal. She has no wheezes. She has no rales. She exhibits no tenderness.  Abdominal: Soft. Bowel sounds are normal. There is no tenderness. There is no rebound and no guarding.  Musculoskeletal: Normal range of motion. She exhibits no edema.  Neurological: She is alert and oriented to person, place, and time.  Skin: Skin is  warm and dry. No rash noted.  Psychiatric: She has a normal mood and affect.    ED Course  Procedures (including critical care time)  Labs Reviewed - No data to display Dg Chest 2 View  03/31/2012  *RADIOLOGY REPORT*  Clinical Data:  Cough, congestion and shortness of breath.  CHEST - 2 VIEW  Comparison: 07/31/2011  Findings: Stable mild cardiomegaly.  No edema or infiltrates identified.  No pleural effusions.  The visualized skeletal structures are unremarkable.  IMPRESSION: Stable mild cardiomegaly.   Original Report Authenticated By: Reola Calkins, M.D.      No diagnosis found.  1. Cough   MDM  Some reduction of cough with GI cocktail. Verified medications to be Lisinopril only for Blood pressure, not Exforge. She is seeing some relief with GI cocktail so will treat with Prilosec for possible reflux as contributing to cough. She will follow up with her doctor for recheck and discuss changing from Lisinopril if Prilosec trial fails to resolve cough.        Rodena Medin, PA-C 03/31/12 [phone]

## 2012-04-29 ENCOUNTER — Emergency Department (HOSPITAL_COMMUNITY): Payer: Medicaid Other

## 2012-04-29 ENCOUNTER — Emergency Department (HOSPITAL_COMMUNITY)
Admission: EM | Admit: 2012-04-29 | Discharge: 2012-04-29 | Disposition: A | Discharge status: Home / Self Care | Payer: Medicaid Other | Attending: Emergency Medicine | Admitting: Emergency Medicine

## 2012-04-29 ENCOUNTER — Encounter (HOSPITAL_COMMUNITY): Payer: Self-pay | Admitting: Emergency Medicine

## 2012-04-29 DIAGNOSIS — Z79899 Other long term (current) drug therapy: Secondary | ICD-10-CM | POA: Insufficient documentation

## 2012-04-29 DIAGNOSIS — M25512 Pain in left shoulder: Secondary | ICD-10-CM

## 2012-04-29 DIAGNOSIS — R0602 Shortness of breath: Secondary | ICD-10-CM | POA: Insufficient documentation

## 2012-04-29 DIAGNOSIS — R079 Chest pain, unspecified: Secondary | ICD-10-CM

## 2012-04-29 DIAGNOSIS — M25519 Pain in unspecified shoulder: Secondary | ICD-10-CM | POA: Insufficient documentation

## 2012-04-29 DIAGNOSIS — R0789 Other chest pain: Secondary | ICD-10-CM | POA: Insufficient documentation

## 2012-04-29 DIAGNOSIS — I10 Essential (primary) hypertension: Secondary | ICD-10-CM | POA: Insufficient documentation

## 2012-04-29 DIAGNOSIS — E669 Obesity, unspecified: Secondary | ICD-10-CM | POA: Insufficient documentation

## 2012-04-29 LAB — CBC WITH DIFFERENTIAL/PLATELET
Basophils Absolute: 0 10*3/uL (ref 0.0–0.1)
Basophils Relative: 0 % (ref 0–1)
HCT: 41.1 % (ref 36.0–46.0)
Lymphocytes Relative: 33 % (ref 12–46)
MCHC: 35.5 g/dL (ref 30.0–36.0)
Monocytes Absolute: 0.4 10*3/uL (ref 0.1–1.0)
Neutro Abs: 2.7 10*3/uL (ref 1.7–7.7)
Neutrophils Relative %: 57 % (ref 43–77)
Platelets: 194 10*3/uL (ref 150–400)
RDW: 12.5 % (ref 11.5–15.5)
WBC: 4.7 10*3/uL (ref 4.0–10.5)

## 2012-04-29 LAB — BASIC METABOLIC PANEL
CO2: 20 mEq/L (ref 19–32)
Chloride: 102 mEq/L (ref 96–112)
Creatinine, Ser: 0.76 mg/dL (ref 0.50–1.10)
GFR calc Af Amer: >90 mL/min (ref >90)
Potassium: 4 mEq/L (ref 3.5–5.1)
Sodium: 136 mEq/L (ref 135–145)

## 2012-04-29 LAB — TROPONIN I: Troponin I: <0.3 ng/mL (ref <0.30)

## 2012-04-29 MED ORDER — IBUPROFEN 800 MG PO TABS
800.0000 mg | ORAL_TABLET | Freq: Once | ORAL | Status: AC
  Administered 2012-04-29: 800 mg via ORAL
  Filled 2012-04-29: qty 1

## 2012-04-29 MED ORDER — NAPROXEN 500 MG PO TABS: 500.0000 mg | ORAL_TABLET | Freq: Two times a day (BID) | ORAL | Status: DC

## 2012-04-29 MED ORDER — HYDROCODONE-ACETAMINOPHEN 5-325 MG PO TABS: 1.0000 | ORAL_TABLET | Freq: Four times a day (QID) | ORAL | Status: DC | PRN

## 2012-04-29 NOTE — ED Notes (Signed)
Pt states that her left shoulder and chest are "achey".  Thought that it was her new mattress making her sore but after talking more with pt she states that she has a hole in her heart and high blood pressure.

## 2012-04-29 NOTE — ED Provider Notes (Addendum)
History     CSN: [id number]  Arrival date & time 04/29/12  0801   First MD Initiated Contact with Patient 04/29/12 0840      Chief Complaint  Patient presents with  . Shoulder Pain  . Chest Pain    (Consider location/radiation/quality/duration/timing/severity/associated sxs/prior treatment) The history is provided by the patient.   41 year old female presents to the emergency department with complaint of left-sided chest pain and shoulder pain and ongoing intermittently for the past 3 days worse in the last 24-hour. Is made worse with movement of the left arm. Not worse by raising arm above the head. No numbness or tingling in the left hand. Pain when present is 8/10 currently not present. Patient states associated with shortness of breath she has shortness of breath all the time. No nausea no vomiting risk factors include borderline diabetes and hypertension for cardiac disease. Denies any leg swelling. Patient has not felt as if the heart is going fast. Patient's had similar complaints in the past without any specific findings. Pain is described as sharp and is nonradiating.  Past Medical History  Diagnosis Date  . Hypertension   . Obesity     Past Surgical History  Procedure Date  . Tubal ligation     Family History  Problem Relation Age of Onset  . Cancer Other   . Hyperlipidemia Other   . Hypertension Other     History  Substance Use Topics  . Smoking status: Never Smoker   . Smokeless tobacco: Never Used  . Alcohol Use: No    OB History    Grav Para Term Preterm Abortions TAB SAB Ect Mult Living                  Review of Systems  Constitutional: Negative for fever.  HENT: Negative for neck pain.   Eyes: Negative for visual disturbance.  Respiratory: Positive for shortness of breath.   Cardiovascular: Positive for chest pain. Negative for palpitations and leg swelling.  Gastrointestinal: Negative for nausea, vomiting and abdominal pain.  Genitourinary:  Negative for dysuria.  Musculoskeletal: Negative for back pain.  Skin: Negative for rash.  Neurological: Negative for weakness, numbness and headaches.  Hematological: Does not bruise/bleed easily.    Allergies  Penicillins and Latex  Home Medications   Current Outpatient Rx  Name  Route  Sig  Dispense  Refill  . ALBUTEROL SULFATE HFA 108 (90 BASE) MCG/ACT IN AERS   Inhalation   Inhale 2 puffs into the lungs every 6 (six) hours as needed. Wheezing and shortness of breath         . ALBUTEROL SULFATE (2.5 MG/3ML) 0.083% IN NEBU   Nebulization   Take 2.5 mg by nebulization every 6 (six) hours as needed. For shortness of breath         . HYDROCODONE-ACETAMINOPHEN 5-325 MG PO TABS   Oral   Take 1 tablet by mouth every 6 (six) hours as needed. For pain         . NORETHIN ACE-ETH ESTRAD-FE 1-20 MG-MCG(24) PO TABS   Oral   Take 1 tablet by mouth daily.         . OLOPATADINE HCL 0.2 % OP SOLN   Both Eyes   Place 1 drop into both eyes daily.         . TRIAMCINOLONE ACETONIDE 0.1 % EX CREA   Topical   Apply 1 application topically daily. For hand         .  HYDROCODONE-ACETAMINOPHEN 5-325 MG PO TABS   Oral   Take 1-2 tablets by mouth every 6 (six) hours as needed for pain.   10 tablet   0   . NAPROXEN 500 MG PO TABS   Oral   Take 1 tablet (500 mg total) by mouth 2 (two) times daily.   14 tablet   0     BP 137/84  Pulse 90  Temp 98.4 F (36.9 C) (Oral)  Resp 18  SpO2 99%  LMP 04/10/2012  Physical Exam  Nursing note and vitals reviewed. Constitutional: She is oriented to person, place, and time. She appears well-developed and well-nourished. No distress.  HENT:  Head: Normocephalic and atraumatic.  Mouth/Throat: Oropharynx is clear and moist.  Eyes: Conjunctivae normal are normal. Pupils are equal, round, and reactive to light.  Neck: Normal range of motion. Neck supple.  Cardiovascular: Normal rate, regular rhythm and normal heart sounds.   No  murmur heard. Pulmonary/Chest: Effort normal and breath sounds normal.  Abdominal: Soft. There is no tenderness.  Musculoskeletal: Normal range of motion. She exhibits no edema.  Neurological: She is alert and oriented to person, place, and time. No cranial nerve deficit. She exhibits normal muscle tone. Coordination normal.  Skin: Skin is warm. No rash noted.    ED Course  Procedures (including critical care time)  Labs Reviewed  BASIC METABOLIC PANEL - Abnormal; Notable for the following:    Glucose, Bld 181 (*)     All other components within normal limits  CBC WITH DIFFERENTIAL  TROPONIN I   Dg Chest 2 View  04/29/2012  *RADIOLOGY REPORT*  Clinical Data: Chest pain, shortness of breath  CHEST - 2 VIEW  Comparison: 03/31/2012  Findings: Stable cardiomegaly but lungs are clear.  Negative for CHF, pneumonia, collapse, consolidation, effusion or pneumothorax. Trachea midline.  IMPRESSION: Stable cardiomegaly.  No acute chest process.   Original Report Authenticated By: Judie Petit. Shick, M.D.    Dg Shoulder Left  04/29/2012  *RADIOLOGY REPORT*  Clinical Data: Shoulder pain.  Good range of motion.  LEFT SHOULDER - 2+ VIEW  Comparison:  None.  Findings:  There is no evidence of fracture or dislocation.  There is no evidence of arthropathy or other focal bone abnormality. Soft tissues are unremarkable.  IMPRESSION: Negative.   Original Report Authenticated By: Davonna Belling, M.D.    Results for orders placed during the hospital encounter of 04/29/12  CBC WITH DIFFERENTIAL      Component Value Range   WBC 4.7  4.0 - 10.5 K/uL   RBC 4.72  3.87 - 5.11 MIL/uL   Hemoglobin 14.6  12.0 - 15.0 g/dL   HCT 16.1  09.6 - 04.5 %   MCV 87.1  78.0 - 100.0 fL   MCH 30.9  26.0 - 34.0 pg   MCHC 35.5  30.0 - 36.0 g/dL   RDW 40.9  81.1 - 91.4 %   Platelets 194  150 - 400 K/uL   Neutrophils Relative 57  43 - 77 %   Neutro Abs 2.7  1.7 - 7.7 K/uL   Lymphocytes Relative 33  12 - 46 %   Lymphs Abs 1.6  0.7 - 4.0  K/uL   Monocytes Relative 8  3 - 12 %   Monocytes Absolute 0.4  0.1 - 1.0 K/uL   Eosinophils Relative 2  0 - 5 %   Eosinophils Absolute 0.1  0.0 - 0.7 K/uL   Basophils Relative 0  0 - 1 %  Basophils Absolute 0.0  0.0 - 0.1 K/uL  BASIC METABOLIC PANEL      Component Value Range   Sodium 136  135 - 145 mEq/L   Potassium 4.0  3.5 - 5.1 mEq/L   Chloride 102  96 - 112 mEq/L   CO2 20  19 - 32 mEq/L   Glucose, Bld 181 (*) 70 - 99 mg/dL   BUN 8  6 - 23 mg/dL   Creatinine, Ser 6.57  0.50 - 1.10 mg/dL   Calcium 9.1  8.4 - 84.6 mg/dL   GFR calc non Af Amer >90  >90 mL/min   GFR calc Af Amer >90  >90 mL/min  TROPONIN I      Component Value Range   Troponin I <0.30  <0.30 ng/mL     1. Chest pain   2. Shoulder pain, left       MDM  Symptoms most consistent with musculoskeletal left shoulder pain. Not consistent with a rotator cuff injury. Workup today symptoms not related to acute cardiac event. EKG without acute changes troponin is normal chest x-ray without pneumonia or pneumothorax.  X-rays left shoulder are negative. As stated above symptoms not consistent with rotator cuff injury. Most likely the pain is entirely related to the left shoulder area. Orthopedic referral given.      Shelda Jakes, MD 04/29/12 1055  Shelda Jakes, MD 04/29/12 [phone]

## 2012-04-29 NOTE — ED Notes (Signed)
Patient transported to X-ray 

## 2012-06-04 ENCOUNTER — Encounter (HOSPITAL_COMMUNITY): Payer: Self-pay

## 2012-06-04 ENCOUNTER — Emergency Department (HOSPITAL_COMMUNITY)
Admission: EM | Admit: 2012-06-04 | Discharge: 2012-06-04 | Disposition: A | Discharge status: Home / Self Care | Payer: Medicaid Other | Attending: Emergency Medicine | Admitting: Emergency Medicine

## 2012-06-04 ENCOUNTER — Emergency Department (HOSPITAL_COMMUNITY): Payer: Medicaid Other

## 2012-06-04 DIAGNOSIS — J029 Acute pharyngitis, unspecified: Secondary | ICD-10-CM | POA: Insufficient documentation

## 2012-06-04 DIAGNOSIS — R51 Headache: Secondary | ICD-10-CM | POA: Insufficient documentation

## 2012-06-04 DIAGNOSIS — I10 Essential (primary) hypertension: Secondary | ICD-10-CM | POA: Insufficient documentation

## 2012-06-04 DIAGNOSIS — R6889 Other general symptoms and signs: Secondary | ICD-10-CM

## 2012-06-04 DIAGNOSIS — Z79899 Other long term (current) drug therapy: Secondary | ICD-10-CM | POA: Insufficient documentation

## 2012-06-04 DIAGNOSIS — E669 Obesity, unspecified: Secondary | ICD-10-CM | POA: Insufficient documentation

## 2012-06-04 MED ORDER — OSELTAMIVIR PHOSPHATE 75 MG PO CAPS: 75.0000 mg | ORAL_CAPSULE | Freq: Two times a day (BID) | ORAL | Status: DC

## 2012-06-04 NOTE — ED Notes (Signed)
Pt called into room to inform of severe headache.  Pt states she has had sore throat and headache  For 3 days. No sinus drainage or nasal congestion. Pt complain of hot and cold flashes for 3 days . Pt denies cough but states she has been spitting alot. Denies any medication taken today.

## 2012-06-04 NOTE — ED Provider Notes (Signed)
History     CSN: [id number]  Arrival date & time 06/04/12  1017   First MD Initiated Contact with Patient 06/04/12 1113      Chief Complaint  Patient presents with  . Sore Throat  . Generalized Body Aches  . Headache    (Consider location/radiation/quality/duration/timing/severity/associated sxs/prior treatment) HPI Comments: This is a 41 year old female, who presents emergency department with chief complaint of flulike symptoms. She reports sore throat, body aches, and headache x3 days. She denies chest pain, shortness of breath, nausea, vomiting, diarrhea, constipation. She also denies fever. She states that she has been managing her symptoms with calls sore throat lozenges and Chloraseptic Spray, with little relief. She states that her discomfort is moderate. Her symptoms have been worsening.  The history is provided by the patient. No language interpreter was used.    Past Medical History  Diagnosis Date  . Hypertension   . Obesity     Past Surgical History  Procedure Date  . Tubal ligation     Family History  Problem Relation Age of Onset  . Cancer Other   . Hyperlipidemia Other   . Hypertension Other     History  Substance Use Topics  . Smoking status: Never Smoker   . Smokeless tobacco: Never Used  . Alcohol Use: No    OB History    Grav Para Term Preterm Abortions TAB SAB Ect Mult Living                  Review of Systems  All other systems reviewed and are negative.    Allergies  Penicillins and Latex  Home Medications   Current Outpatient Rx  Name  Route  Sig  Dispense  Refill  . ALBUTEROL SULFATE HFA 108 (90 BASE) MCG/ACT IN AERS   Inhalation   Inhale 2 puffs into the lungs every 6 (six) hours as needed. Wheezing and shortness of breath         . ALBUTEROL SULFATE (2.5 MG/3ML) 0.083% IN NEBU   Nebulization   Take 2.5 mg by nebulization every 6 (six) hours as needed. For shortness of breath         . HYDROCODONE-ACETAMINOPHEN  5-325 MG PO TABS   Oral   Take 1 tablet by mouth every 6 (six) hours as needed. For pain         . OLOPATADINE HCL 0.2 % OP SOLN   Both Eyes   Place 1 drop into both eyes daily.         Marland Kitchen PHENOL 1.4 % MT LIQD   Mouth/Throat   Use as directed 1 spray in the mouth or throat as needed. Sore throat           BP 131/74  Pulse 95  Temp 98.3 F (36.8 C) (Oral)  Resp 10  SpO2 100%  LMP 06/04/2012  Physical Exam  Nursing note and vitals reviewed. Constitutional: She is oriented to person, place, and time. She appears well-developed and well-nourished.  HENT:  Head: Normocephalic and atraumatic.  Right Ear: External ear normal.  Left Ear: External ear normal.  Mouth/Throat: Oropharynx is clear and moist. No oropharyngeal exudate.       Swollen, erythematous turbinates, redness inflamed oropharynx, without tonsillar exudates, no signs of tonsillar or peritonsillar abscess, uvula is midline  Eyes: Conjunctivae normal and EOM are normal. Pupils are equal, round, and reactive to light.  Neck: Normal range of motion. Neck supple.  Cardiovascular: Normal rate and regular rhythm.  Murmur heard.      Systolic heart murmur  Pulmonary/Chest: Effort normal and breath sounds normal. No respiratory distress. She has no wheezes. She has no rales. She exhibits no tenderness.  Abdominal: Soft. Bowel sounds are normal.  Musculoskeletal: Normal range of motion.  Neurological: She is alert and oriented to person, place, and time.  Skin: Skin is warm and dry.  Psychiatric: She has a normal mood and affect. Her behavior is normal. Judgment and thought content normal.    ED Course  Procedures (including critical care time)  Results for orders placed during the hospital encounter of 04/29/12  CBC WITH DIFFERENTIAL      Component Value Range   WBC 4.7  4.0 - 10.5 K/uL   RBC 4.72  3.87 - 5.11 MIL/uL   Hemoglobin 14.6  12.0 - 15.0 g/dL   HCT 96.0  45.4 - 09.8 %   MCV 87.1  78.0 - 100.0 fL    MCH 30.9  26.0 - 34.0 pg   MCHC 35.5  30.0 - 36.0 g/dL   RDW 11.9  14.7 - 82.9 %   Platelets 194  150 - 400 K/uL   Neutrophils Relative 57  43 - 77 %   Neutro Abs 2.7  1.7 - 7.7 K/uL   Lymphocytes Relative 33  12 - 46 %   Lymphs Abs 1.6  0.7 - 4.0 K/uL   Monocytes Relative 8  3 - 12 %   Monocytes Absolute 0.4  0.1 - 1.0 K/uL   Eosinophils Relative 2  0 - 5 %   Eosinophils Absolute 0.1  0.0 - 0.7 K/uL   Basophils Relative 0  0 - 1 %   Basophils Absolute 0.0  0.0 - 0.1 K/uL  BASIC METABOLIC PANEL      Component Value Range   Sodium 136  135 - 145 mEq/L   Potassium 4.0  3.5 - 5.1 mEq/L   Chloride 102  96 - 112 mEq/L   CO2 20  19 - 32 mEq/L   Glucose, Bld 181 (*) 70 - 99 mg/dL   BUN 8  6 - 23 mg/dL   Creatinine, Ser 5.62  0.50 - 1.10 mg/dL   Calcium 9.1  8.4 - 13.0 mg/dL   GFR calc non Af Amer >90  >90 mL/min   GFR calc Af Amer >90  >90 mL/min  TROPONIN I      Component Value Range   Troponin I <0.30  <0.30 ng/mL   Dg Chest 2 View  06/04/2012  *RADIOLOGY REPORT*  Clinical Data: Sore throat, bodyache.  CHEST - 2 VIEW  Comparison: April 29, 2012.  Findings: Stable cardiomegaly.  No acute pulmonary disease is noted.  Bony thorax is intact.  IMPRESSION: No acute cardiopulmonary abnormality seen.   Original Report Authenticated By: Lupita Raider.,  M.D.       1. Flu-like symptoms       MDM   36-year-old with flulike illness. I'm going to treat it like his flu, with Tamiflu. I have discussed return precautions patient. She understands and is agreeable with the plan. She is stable and ready for discharge.       Roxy Horseman, PA-C 06/04/12 1210

## 2012-06-04 NOTE — ED Provider Notes (Signed)
Medical screening examination/treatment/procedure(s) were performed by non-physician practitioner and as supervising physician I was immediately available for consultation/collaboration.  Esvin Hnat, MD 06/04/12 1613 

## 2012-06-04 NOTE — ED Notes (Signed)
Patient reports that she has had body aches, headache, and a sore throat x 3 days. Patient has used Hall's sore throat lozenges and chloraseptic spray with no relief. Patient denies N/V/D or fever.

## 2012-07-15 ENCOUNTER — Encounter (HOSPITAL_COMMUNITY): Payer: Self-pay | Admitting: Emergency Medicine

## 2012-07-15 ENCOUNTER — Emergency Department (HOSPITAL_COMMUNITY)
Admission: EM | Admit: 2012-07-15 | Discharge: 2012-07-15 | Disposition: A | Discharge status: Home / Self Care | Payer: Medicaid Other | Attending: Emergency Medicine | Admitting: Emergency Medicine

## 2012-07-15 ENCOUNTER — Other Ambulatory Visit: Payer: Self-pay

## 2012-07-15 DIAGNOSIS — I10 Essential (primary) hypertension: Secondary | ICD-10-CM | POA: Insufficient documentation

## 2012-07-15 DIAGNOSIS — Z862 Personal history of diseases of the blood and blood-forming organs and certain disorders involving the immune mechanism: Secondary | ICD-10-CM | POA: Insufficient documentation

## 2012-07-15 DIAGNOSIS — R011 Cardiac murmur, unspecified: Secondary | ICD-10-CM | POA: Insufficient documentation

## 2012-07-15 DIAGNOSIS — R1013 Epigastric pain: Secondary | ICD-10-CM

## 2012-07-15 DIAGNOSIS — E669 Obesity, unspecified: Secondary | ICD-10-CM | POA: Insufficient documentation

## 2012-07-15 DIAGNOSIS — Z8639 Personal history of other endocrine, nutritional and metabolic disease: Secondary | ICD-10-CM | POA: Insufficient documentation

## 2012-07-15 DIAGNOSIS — Z9851 Tubal ligation status: Secondary | ICD-10-CM | POA: Insufficient documentation

## 2012-07-15 DIAGNOSIS — Z79899 Other long term (current) drug therapy: Secondary | ICD-10-CM | POA: Insufficient documentation

## 2012-07-15 HISTORY — DX: Cardiac murmur, unspecified: R01.1

## 2012-07-15 NOTE — ED Notes (Addendum)
Pt states that last night she went to the club with her sister and states that she started having sharp pain in her epigastric area. States she does not drink or smoke. States it is coming and going. Denies n/v/d.

## 2012-07-15 NOTE — ED Provider Notes (Signed)
History     CSN: [id number]  Arrival date & time 07/15/12  Mackenzie Patterson   First MD Initiated Contact with Patient 07/15/12 2148      Chief Complaint  Patient presents with  . Abdominal Pain    (Consider location/radiation/quality/duration/timing/severity/associated sxs/prior treatment) HPI Comments: Patient states that last night while at a club she developed epigastric pain without radiation, nausea, SOB, diaphoresis.  Denies ETON consumption, smoking, drug use.  She did not take any OTC medication for discomfort.  The pain lasted until she drove into the parking lot of the ED 4 hours ago.  The pain has not returned   Patient is a 42 y.o. female presenting with abdominal pain. The history is provided by the patient.  Abdominal Pain The primary symptoms of the illness include abdominal pain. The primary symptoms of the illness do not include fever, nausea or vomiting. The current episode started 13 to 24 hours ago. The onset of the illness was gradual. The problem has been resolved.  The patient states that she believes she is currently not pregnant. The patient has not had a change in bowel habit. Symptoms associated with the illness do not include chills or heartburn. Significant associated medical issues do not include PUD, GERD, inflammatory bowel disease, gallstones, liver disease, substance abuse or cardiac disease.    Past Medical History  Diagnosis Date  . Hypertension   . Obesity   . Heart murmur   . Borderline diabetes mellitus     Past Surgical History  Procedure Date  . Tubal ligation     Family History  Problem Relation Age of Onset  . Cancer Other   . Hyperlipidemia Other   . Hypertension Other     History  Substance Use Topics  . Smoking status: Never Smoker   . Smokeless tobacco: Never Used  . Alcohol Use: No    OB History    Grav Para Term Preterm Abortions TAB SAB Ect Mult Living                  Review of Systems  Constitutional: Negative for fever  and chills.  HENT: Negative for trouble swallowing.   Gastrointestinal: Positive for abdominal pain. Negative for heartburn, nausea and vomiting.  Genitourinary: Negative.   Musculoskeletal: Negative for myalgias.  Skin: Negative for rash.  Neurological: Negative for dizziness and weakness.  All other systems reviewed and are negative.    Allergies  Penicillins and Latex  Home Medications   Current Outpatient Rx  Name  Route  Sig  Dispense  Refill  . ALBUTEROL SULFATE HFA 108 (90 BASE) MCG/ACT IN AERS   Inhalation   Inhale 2 puffs into the lungs every 6 (six) hours as needed. Wheezing and shortness of breath         . ALBUTEROL SULFATE (2.5 MG/3ML) 0.083% IN NEBU   Nebulization   Take 2.5 mg by nebulization every 6 (six) hours as needed. For shortness of breath         . MINASTRIN 24 FE PO   Oral   Take 1 tablet by mouth daily.         . OLOPATADINE HCL 0.2 % OP SOLN   Both Eyes   Place 1 drop into both eyes daily.         . TRIAMCINOLONE ACETONIDE 0.1 % EX CREA   Topical   Apply 1 application topically 2 (two) times daily as needed. For dry skin  BP 131/78  Pulse 88  Temp 97.8 F (36.6 C) (Oral)  Resp 16  SpO2 100%  LMP 06/21/2012  Physical Exam  Constitutional: She appears well-developed and well-nourished.       obese  HENT:  Head: Normocephalic.  Eyes: Pupils are equal, round, and reactive to light.  Neck: Normal range of motion.  Cardiovascular: Normal rate and regular rhythm.   No murmur heard. Pulmonary/Chest: Effort normal and breath sounds normal. She has no wheezes. She exhibits no tenderness.  Abdominal: Soft. Bowel sounds are normal. She exhibits no distension. There is no tenderness.  Musculoskeletal: Normal range of motion.  Neurological: She is alert.  Skin: Skin is warm. No rash noted.    ED Course  Procedures (including critical care time)  Labs Reviewed - No data to display No results found.   1. Epigastric  pain    ED ECG REPORT   Date: 07/15/2012  EKG Time: 10:26 PM  Rate: 80  Rhythm: normal sinus rhythm,  normal EKG, normal sinus rhythm, unchanged from previous tracings  Axis:normal  Intervals:none  ST&T Change: none  Narrative Interpretation: normal             MDM  No further pain for the past 6 hours         Arman Filter, NP 07/15/12 2226

## 2012-07-22 NOTE — ED Provider Notes (Signed)
Medical screening examination/treatment/procedure(s) were performed by non-physician practitioner and as supervising physician I was immediately available for consultation/collaboration.  Edee Nifong M Carrieanne Kleen, MD 07/22/12 2308 

## 2012-07-27 DIAGNOSIS — I10 Essential (primary) hypertension: Secondary | ICD-10-CM | POA: Insufficient documentation

## 2012-07-27 DIAGNOSIS — Q210 Ventricular septal defect: Secondary | ICD-10-CM | POA: Insufficient documentation

## 2012-08-13 ENCOUNTER — Encounter (HOSPITAL_COMMUNITY): Payer: Self-pay | Admitting: *Deleted

## 2012-08-13 ENCOUNTER — Emergency Department (HOSPITAL_COMMUNITY)
Admission: EM | Admit: 2012-08-13 | Discharge: 2012-08-13 | Disposition: A | Discharge status: Home / Self Care | Payer: Medicaid Other | Attending: Emergency Medicine | Admitting: Emergency Medicine

## 2012-08-13 DIAGNOSIS — M25362 Other instability, left knee: Secondary | ICD-10-CM

## 2012-08-13 DIAGNOSIS — Z79899 Other long term (current) drug therapy: Secondary | ICD-10-CM | POA: Insufficient documentation

## 2012-08-13 DIAGNOSIS — E119 Type 2 diabetes mellitus without complications: Secondary | ICD-10-CM | POA: Insufficient documentation

## 2012-08-13 DIAGNOSIS — E669 Obesity, unspecified: Secondary | ICD-10-CM | POA: Insufficient documentation

## 2012-08-13 DIAGNOSIS — I10 Essential (primary) hypertension: Secondary | ICD-10-CM | POA: Insufficient documentation

## 2012-08-13 DIAGNOSIS — M238X9 Other internal derangements of unspecified knee: Secondary | ICD-10-CM | POA: Insufficient documentation

## 2012-08-13 NOTE — ED Notes (Signed)
Ortho called for knee immobilizer and crutches for pt.

## 2012-08-13 NOTE — ED Notes (Signed)
Pt reports left knee pain x3 weeks. Denies any injury or past surgeries. States knee "pops" and gives out at times. Able to walk/bare weight on affected leg

## 2012-08-13 NOTE — ED Provider Notes (Signed)
History     CSN: [id number]  Arrival date & time 08/13/12  0725   First MD Initiated Contact with Patient 08/13/12 [phone]      Chief Complaint  Patient presents with  . Knee Pain    (Consider location/radiation/quality/duration/timing/severity/associated sxs/prior treatment) HPI  Mackenzie Patterson is a 42 y.o. female complaining of left knee instability x3 weeks. Patient denies any trauma or pain. She states that at least once a day the knee pops and becomes unstable. She denies any prior injuries or surgery to the affected area. She denies numbness, paresthesia, or difficulty ambulating.  Past Medical History  Diagnosis Date  . Hypertension   . Obesity   . Heart murmur   . Borderline diabetes mellitus     Past Surgical History  Procedure Laterality Date  . Tubal ligation    . Cyst excision      Family History  Problem Relation Age of Onset  . Cancer Other   . Hyperlipidemia Other   . Hypertension Other     History  Substance Use Topics  . Smoking status: Never Smoker   . Smokeless tobacco: Never Used  . Alcohol Use: No    OB History   Grav Para Term Preterm Abortions TAB SAB Ect Mult Living                  Review of Systems  Constitutional: Negative for fever.  Respiratory: Negative for shortness of breath.   Cardiovascular: Negative for chest pain.  Gastrointestinal: Negative for nausea, vomiting, abdominal pain and diarrhea.  Musculoskeletal:       Left knee instability   All other systems reviewed and are negative.    Allergies  Penicillins and Latex  Home Medications   Current Outpatient Rx  Name  Route  Sig  Dispense  Refill  . albuterol (PROVENTIL HFA;VENTOLIN HFA) 108 (90 BASE) MCG/ACT inhaler   Inhalation   Inhale 2 puffs into the lungs every 6 (six) hours as needed. Wheezing and shortness of breath         . albuterol (PROVENTIL) (2.5 MG/3ML) 0.083% nebulizer solution   Nebulization   Take 2.5 mg by nebulization every 6 (six) hours  as needed. For shortness of breath         . Norethin Ace-Eth Estrad-FE (MINASTRIN 24 FE PO)   Oral   Take 1 tablet by mouth daily.         . Olopatadine HCl (PATADAY) 0.2 % SOLN   Both Eyes   Place 1 drop into both eyes daily.         Marland Kitchen triamcinolone cream (KENALOG) 0.1 %   Topical   Apply 1 application topically 2 (two) times daily as needed. For dry skin           BP 116/72  Pulse 91  Temp(Src) 97.8 F (36.6 C) (Oral)  Resp 16  Ht 4\' 11"  (1.499 m)  Wt 166 lb (75.297 kg)  BMI 33.51 kg/m2  SpO2 99%  LMP 07/19/2012  Physical Exam  Nursing note and vitals reviewed. Constitutional: She is oriented to person, place, and time. She appears well-developed and well-nourished. No distress.  HENT:  Head: Normocephalic.  Mouth/Throat: Oropharynx is clear and moist.  Eyes: Conjunctivae and EOM are normal. Pupils are equal, round, and reactive to light.  Neck: Normal range of motion. Neck supple.  Cardiovascular: Normal rate.   Pulmonary/Chest: Effort normal. No stridor.  Abdominal: Soft.  Musculoskeletal: Normal range of motion.  Left  Knee: No deformity, erythema or abrasions. FROM. No effusion or crepitance. Anterior and posterior drawer show no abnormal laxity. Stable to valgus and varus stress. Joint lines are non-tender. Neurovascularly intact. Pt ambulates with non-antalgic gait.   Neurological: She is alert and oriented to person, place, and time.  Psychiatric: She has a normal mood and affect.    ED Course  Procedures (including critical care time)  Labs Reviewed - No data to display No results found.   1. Knee buckling, left       MDM  Normal knee exam. I will give the patient crutches and a knee immobilizer after follow with orthopedist if her symptoms continue.   Pt verbalized understanding and agrees with care plan. Outpatient follow-up and return precautions given.            Wynetta Emery, PA-C 08/13/12 1014

## 2012-08-15 NOTE — ED Provider Notes (Signed)
Medical screening examination/treatment/procedure(s) were performed by non-physician practitioner and as supervising physician I was immediately available for consultation/collaboration.   Suzi Roots, MD 08/15/12 [phone]

## 2012-08-19 NOTE — ED Notes (Signed)
Opened chart to reprint d/c instructions for pt

## 2012-08-28 ENCOUNTER — Ambulatory Visit: Payer: Medicaid Other | Attending: Orthopaedic Surgery

## 2012-08-28 DIAGNOSIS — M6281 Muscle weakness (generalized): Secondary | ICD-10-CM | POA: Insufficient documentation

## 2012-08-28 DIAGNOSIS — M25669 Stiffness of unspecified knee, not elsewhere classified: Secondary | ICD-10-CM | POA: Insufficient documentation

## 2012-08-28 DIAGNOSIS — R262 Difficulty in walking, not elsewhere classified: Secondary | ICD-10-CM | POA: Insufficient documentation

## 2012-08-28 DIAGNOSIS — IMO0001 Reserved for inherently not codable concepts without codable children: Secondary | ICD-10-CM | POA: Insufficient documentation

## 2012-08-28 DIAGNOSIS — M25569 Pain in unspecified knee: Secondary | ICD-10-CM | POA: Insufficient documentation

## 2012-09-06 ENCOUNTER — Ambulatory Visit: Payer: Medicaid Other | Admitting: Rehabilitation

## 2012-09-17 ENCOUNTER — Ambulatory Visit: Payer: Medicaid Other | Admitting: *Deleted

## 2012-11-03 ENCOUNTER — Emergency Department (HOSPITAL_COMMUNITY)
Admission: EM | Admit: 2012-11-03 | Discharge: 2012-11-03 | Disposition: A | Discharge status: Home / Self Care | Payer: Medicaid Other | Attending: Emergency Medicine | Admitting: Emergency Medicine

## 2012-11-03 DIAGNOSIS — Z79899 Other long term (current) drug therapy: Secondary | ICD-10-CM | POA: Insufficient documentation

## 2012-11-03 DIAGNOSIS — R21 Rash and other nonspecific skin eruption: Secondary | ICD-10-CM | POA: Insufficient documentation

## 2012-11-03 DIAGNOSIS — I10 Essential (primary) hypertension: Secondary | ICD-10-CM | POA: Insufficient documentation

## 2012-11-03 DIAGNOSIS — Z880 Allergy status to penicillin: Secondary | ICD-10-CM | POA: Insufficient documentation

## 2012-11-03 DIAGNOSIS — Z862 Personal history of diseases of the blood and blood-forming organs and certain disorders involving the immune mechanism: Secondary | ICD-10-CM | POA: Insufficient documentation

## 2012-11-03 DIAGNOSIS — E669 Obesity, unspecified: Secondary | ICD-10-CM | POA: Insufficient documentation

## 2012-11-03 DIAGNOSIS — Z91040 Latex allergy status: Secondary | ICD-10-CM | POA: Insufficient documentation

## 2012-11-03 DIAGNOSIS — R011 Cardiac murmur, unspecified: Secondary | ICD-10-CM | POA: Insufficient documentation

## 2012-11-03 DIAGNOSIS — Z8639 Personal history of other endocrine, nutritional and metabolic disease: Secondary | ICD-10-CM | POA: Insufficient documentation

## 2012-11-03 MED ORDER — TRIAMCINOLONE ACETONIDE 0.025 % EX OINT: TOPICAL_OINTMENT | Freq: Two times a day (BID) | CUTANEOUS | Status: DC

## 2012-11-03 NOTE — ED Provider Notes (Signed)
History     CSN: [id number]  Arrival date & time 11/03/12  4098   First MD Initiated Contact with Patient 11/03/12 [phone]      No chief complaint on file.   (Consider location/radiation/quality/duration/timing/severity/associated sxs/prior treatment) HPI  Pt is 42 yo F presenting to ED for pruritic non-draining rash that began on Wednesday on her eyelids and has spread to axilla and breast region. Pt states she has tried Benadryl every six hours, cortisone cream w/o relief. Patient recently purchased new sheets and slept in them and then developed the rash. Denies any exposure to scabies or bed bugs. Patient denies fevers, chills, nausea, vomiting, or diarrhea.    Past Medical History  Diagnosis Date  . Hypertension   . Obesity   . Heart murmur   . Borderline diabetes mellitus     Past Surgical History  Procedure Laterality Date  . Tubal ligation    . Cyst excision      Family History  Problem Relation Age of Onset  . Cancer Other   . Hyperlipidemia Other   . Hypertension Other     History  Substance Use Topics  . Smoking status: Never Smoker   . Smokeless tobacco: Never Used  . Alcohol Use: No    OB History   Grav Para Term Preterm Abortions TAB SAB Ect Mult Living                  Review of Systems  Unable to perform ROS Skin: Positive for rash.    Allergies  Penicillins and Latex  Home Medications   Current Outpatient Rx  Name  Route  Sig  Dispense  Refill  . albuterol (PROVENTIL HFA;VENTOLIN HFA) 108 (90 BASE) MCG/ACT inhaler   Inhalation   Inhale 2 puffs into the lungs every 6 (six) hours as needed. Wheezing and shortness of breath         . albuterol (PROVENTIL) (2.5 MG/3ML) 0.083% nebulizer solution   Nebulization   Take 2.5 mg by nebulization every 6 (six) hours as needed. For shortness of breath         . amLODipine (NORVASC) 10 MG tablet   Oral   Take 5 mg by mouth daily. Take a half tablet daily         . Norethin Ace-Eth  Estrad-FE (MINASTRIN 24 FE PO)   Oral   Take 1 tablet by mouth daily.         . Olopatadine HCl (PATADAY) 0.2 % SOLN   Both Eyes   Place 1 drop into both eyes daily.         Marland Kitchen triamcinolone cream (KENALOG) 0.1 %   Topical   Apply 1 application topically 2 (two) times daily as needed. For dry skin           There were no vitals taken for this visit.  Physical Exam  Constitutional: She is oriented to person, place, and time. She appears well-developed and well-nourished.  HENT:  Head: Normocephalic and atraumatic.  Eyes: EOM are normal. Pupils are equal, round, and reactive to light.  Cardiovascular: Normal rate, regular rhythm and normal heart sounds.   Pulmonary/Chest: Effort normal and breath sounds normal.  Abdominal: Soft. Bowel sounds are normal.  Neurological: She is alert and oriented to person, place, and time.  Skin: Skin is warm and dry.  Few maculopapular spots under the breast. No drainage or crusting appearance.   Psychiatric: She has a normal mood and affect.  ED Course  Procedures (including critical care time)  Labs Reviewed - No data to display No results found.   1. Rash       MDM  No evidence of SJS or necrotizing fasciitis. Due to pruritic and not painful nature of blisters do not suspect pemphigus vulgaris. At this time there does not appear to be any evidence of an acute emergency medical condition and the patient appears stable for discharge with appropriate outpatient follow up. Diagnosis and warning signs of increasing infection were discussed with patient who verbalizes understanding and is agreeable to discharge. Pt case discussed with Dr. Judd Lien who agrees with my plan. Patient is stable at time of discharge          Jeannetta Ellis, PA-C 11/03/12 1006

## 2012-11-03 NOTE — ED Notes (Signed)
Pt transferred to room 12 so PA can evaluate pt in gown

## 2012-11-03 NOTE — ED Notes (Signed)
Pt is complaining of a generalized itchy rash over body.S/S appeared on Wednesday after she brought new sheets for her bed.The Pt tried some over the counter remedies none were effective.Will monitor.

## 2012-11-03 NOTE — ED Provider Notes (Signed)
Medical screening examination/treatment/procedure(s) were performed by non-physician practitioner and as supervising physician I was immediately available for consultation/collaboration.  Haskel Dewalt, MD 11/03/12 1500 

## 2012-11-22 ENCOUNTER — Other Ambulatory Visit: Payer: Self-pay | Admitting: Cardiology

## 2012-11-22 ENCOUNTER — Ambulatory Visit
Admission: RE | Admit: 2012-11-22 | Discharge: 2012-11-22 | Disposition: A | Discharge status: Home / Self Care | Payer: Medicaid Other | Source: Ambulatory Visit | Attending: Cardiology | Admitting: Cardiology

## 2012-11-22 DIAGNOSIS — R52 Pain, unspecified: Secondary | ICD-10-CM

## 2012-12-07 ENCOUNTER — Observation Stay (HOSPITAL_COMMUNITY)
Admission: EM | Admit: 2012-12-07 | Discharge: 2012-12-09 | Disposition: A | Discharge status: Home / Self Care | Payer: Medicaid Other | Attending: Cardiovascular Disease | Admitting: Cardiovascular Disease

## 2012-12-07 ENCOUNTER — Encounter (HOSPITAL_COMMUNITY): Payer: Self-pay | Admitting: *Deleted

## 2012-12-07 DIAGNOSIS — I510 Cardiac septal defect, acquired: Secondary | ICD-10-CM | POA: Insufficient documentation

## 2012-12-07 DIAGNOSIS — I359 Nonrheumatic aortic valve disorder, unspecified: Secondary | ICD-10-CM | POA: Insufficient documentation

## 2012-12-07 DIAGNOSIS — R079 Chest pain, unspecified: Principal | ICD-10-CM | POA: Insufficient documentation

## 2012-12-07 DIAGNOSIS — R55 Syncope and collapse: Secondary | ICD-10-CM

## 2012-12-07 DIAGNOSIS — I2789 Other specified pulmonary heart diseases: Secondary | ICD-10-CM | POA: Insufficient documentation

## 2012-12-07 DIAGNOSIS — I079 Rheumatic tricuspid valve disease, unspecified: Secondary | ICD-10-CM | POA: Insufficient documentation

## 2012-12-07 HISTORY — DX: Ventricular septal defect: Q21.0

## 2012-12-07 NOTE — ED Notes (Addendum)
Kids came in and found her on back porch unresponsive. When ems arrived, pt. Swaying in the drive way and alert on scene. Pt. States, "amnesia."  Chest pain - EMS gave 324 mg asa and x 1 0.4 mg ntg sl. Then, told EMS that she has been having chest wall pain bilateral.  Pt. bp improved with ntg.

## 2012-12-08 ENCOUNTER — Encounter (HOSPITAL_COMMUNITY): Payer: Self-pay

## 2012-12-08 ENCOUNTER — Emergency Department (HOSPITAL_COMMUNITY): Payer: Medicaid Other

## 2012-12-08 LAB — LIPID PANEL
HDL: 41 mg/dL (ref >39)
LDL Cholesterol: 97 mg/dL (ref 0–99)

## 2012-12-08 LAB — PROTIME-INR: Prothrombin Time: 15.3 seconds — ABNORMAL HIGH (ref 11.6–15.2)

## 2012-12-08 LAB — URINALYSIS, ROUTINE W REFLEX MICROSCOPIC
Ketones, ur: NEGATIVE mg/dL
Leukocytes, UA: NEGATIVE
Nitrite: NEGATIVE
Specific Gravity, Urine: 1.007 (ref 1.005–1.030)
pH: 6.5 (ref 5.0–8.0)

## 2012-12-08 LAB — CBC
HCT: 39.1 % (ref 36.0–46.0)
HCT: 38.1 % (ref 36.0–46.0)
Hemoglobin: 13.9 g/dL (ref 12.0–15.0)
MCV: 87.6 fL (ref 78.0–100.0)
RBC: 4.35 MIL/uL (ref 3.87–5.11)
RDW: 12.2 % (ref 11.5–15.5)
WBC: 5.5 10*3/uL (ref 4.0–10.5)
WBC: 5.8 10*3/uL (ref 4.0–10.5)

## 2012-12-08 LAB — TROPONIN I
Troponin I: <0.3 ng/mL (ref <0.30)
Troponin I: <0.3 ng/mL (ref <0.30)
Troponin I: <0.3 ng/mL (ref <0.30)

## 2012-12-08 LAB — POCT I-STAT, CHEM 8
BUN: 9 mg/dL (ref 6–23)
Chloride: 108 mEq/L (ref 96–112)
Creatinine, Ser: 0.9 mg/dL (ref 0.50–1.10)
Glucose, Bld: 110 mg/dL — ABNORMAL HIGH (ref 70–99)
Potassium: 3.7 mEq/L (ref 3.5–5.1)

## 2012-12-08 LAB — BASIC METABOLIC PANEL
BUN: 7 mg/dL (ref 6–23)
CO2: 21 mEq/L (ref 19–32)
Chloride: 107 mEq/L (ref 96–112)
Creatinine, Ser: 0.72 mg/dL (ref 0.50–1.10)
GFR calc Af Amer: >90 mL/min (ref >90)
Potassium: 3.7 mEq/L (ref 3.5–5.1)

## 2012-12-08 LAB — D-DIMER, QUANTITATIVE: D-Dimer, Quant: 0.28 ug/mL-FEU (ref 0.00–0.48)

## 2012-12-08 LAB — URINE MICROSCOPIC-ADD ON

## 2012-12-08 LAB — HEPARIN LEVEL (UNFRACTIONATED)
Heparin Unfractionated: 0.25 IU/mL — ABNORMAL LOW (ref 0.30–0.70)
Heparin Unfractionated: 0.33 IU/mL (ref 0.30–0.70)

## 2012-12-08 MED ORDER — ASPIRIN 81 MG PO CHEW: 324.0000 mg | CHEWABLE_TABLET | ORAL | Status: AC

## 2012-12-08 MED ORDER — HEPARIN (PORCINE) IN NACL 100-0.45 UNIT/ML-% IJ SOLN
900.0000 [IU]/h | INTRAMUSCULAR | Status: DC
  Administered 2012-12-08: 750 [IU]/h via INTRAVENOUS
  Administered 2012-12-08: 900 [IU]/h via INTRAVENOUS
  Filled 2012-12-08 (×3): qty 250

## 2012-12-08 MED ORDER — AMLODIPINE BESYLATE 5 MG PO TABS
5.0000 mg | ORAL_TABLET | Freq: Every day | ORAL | Status: DC
  Administered 2012-12-08 – 2012-12-09 (×2): 5 mg via ORAL
  Filled 2012-12-08 (×2): qty 1

## 2012-12-08 MED ORDER — ALBUTEROL SULFATE HFA 108 (90 BASE) MCG/ACT IN AERS: 2.0000 | INHALATION_SPRAY | Freq: Four times a day (QID) | RESPIRATORY_TRACT | Status: DC | PRN

## 2012-12-08 MED ORDER — HEPARIN BOLUS VIA INFUSION
4000.0000 [IU] | Freq: Once | INTRAVENOUS | Status: AC
  Administered 2012-12-08: 4000 [IU] via INTRAVENOUS

## 2012-12-08 MED ORDER — OXYCODONE HCL 5 MG PO TABS
5.0000 mg | ORAL_TABLET | Freq: Four times a day (QID) | ORAL | Status: DC | PRN
  Administered 2012-12-08 (×2): 5 mg via ORAL
  Filled 2012-12-08 (×2): qty 1

## 2012-12-08 MED ORDER — ASPIRIN 300 MG RE SUPP
300.0000 mg | RECTAL | Status: AC
  Filled 2012-12-08: qty 1

## 2012-12-08 MED ORDER — SODIUM CHLORIDE 0.9 % IJ SOLN: 3.0000 mL | Freq: Two times a day (BID) | INTRAMUSCULAR | Status: DC

## 2012-12-08 MED ORDER — ASPIRIN EC 81 MG PO TBEC
81.0000 mg | DELAYED_RELEASE_TABLET | Freq: Every day | ORAL | Status: DC
  Administered 2012-12-09: 81 mg via ORAL
  Filled 2012-12-08: qty 1

## 2012-12-08 MED ORDER — ONDANSETRON HCL 4 MG/2ML IJ SOLN
4.0000 mg | Freq: Four times a day (QID) | INTRAMUSCULAR | Status: DC | PRN
  Administered 2012-12-08 (×2): 4 mg via INTRAVENOUS
  Filled 2012-12-08 (×2): qty 2

## 2012-12-08 MED ORDER — ONDANSETRON HCL 4 MG/2ML IJ SOLN
4.0000 mg | Freq: Once | INTRAMUSCULAR | Status: AC
  Administered 2012-12-08: 4 mg via INTRAVENOUS
  Filled 2012-12-08: qty 2

## 2012-12-08 MED ORDER — OLOPATADINE HCL 0.1 % OP SOLN
1.0000 [drp] | Freq: Every day | OPHTHALMIC | Status: DC
  Administered 2012-12-08 – 2012-12-09 (×2): 1 [drp] via OPHTHALMIC
  Filled 2012-12-08: qty 5

## 2012-12-08 MED ORDER — SODIUM CHLORIDE 0.9 % IV SOLN: 250.0000 mL | INTRAVENOUS | Status: DC | PRN

## 2012-12-08 MED ORDER — NORETHIN ACE-ETH ESTRAD-FE 1-20 MG-MCG(24) PO CHEW
1.0000 | CHEWABLE_TABLET | Freq: Every morning | ORAL | Status: DC
  Administered 2012-12-08: 1 via ORAL
  Administered 2012-12-09: 11:00:00 via ORAL

## 2012-12-08 MED ORDER — MORPHINE SULFATE 4 MG/ML IJ SOLN
4.0000 mg | Freq: Once | INTRAMUSCULAR | Status: AC
  Administered 2012-12-08: 4 mg via INTRAVENOUS
  Filled 2012-12-08: qty 1

## 2012-12-08 MED ORDER — SIMVASTATIN 20 MG PO TABS
20.0000 mg | ORAL_TABLET | Freq: Every day | ORAL | Status: DC
  Administered 2012-12-08: 20 mg via ORAL
  Filled 2012-12-08 (×2): qty 1

## 2012-12-08 MED ORDER — NITROGLYCERIN 0.4 MG SL SUBL: 0.4000 mg | SUBLINGUAL_TABLET | SUBLINGUAL | Status: DC | PRN

## 2012-12-08 MED ORDER — ALBUTEROL SULFATE (5 MG/ML) 0.5% IN NEBU: 2.5000 mg | INHALATION_SOLUTION | RESPIRATORY_TRACT | Status: DC | PRN

## 2012-12-08 MED ORDER — CARVEDILOL 3.125 MG PO TABS
3.1250 mg | ORAL_TABLET | Freq: Two times a day (BID) | ORAL | Status: DC
  Administered 2012-12-08 – 2012-12-09 (×3): 3.125 mg via ORAL
  Filled 2012-12-08 (×5): qty 1

## 2012-12-08 MED ORDER — OLOPATADINE HCL 0.2 % OP SOLN: 1.0000 [drp] | Freq: Every day | OPHTHALMIC | Status: DC

## 2012-12-08 MED ORDER — ACETAMINOPHEN 325 MG PO TABS
650.0000 mg | ORAL_TABLET | ORAL | Status: DC | PRN
  Administered 2012-12-08: 650 mg via ORAL
  Filled 2012-12-08: qty 2

## 2012-12-08 MED ORDER — SODIUM CHLORIDE 0.9 % IJ SOLN: 3.0000 mL | INTRAMUSCULAR | Status: DC | PRN

## 2012-12-08 NOTE — ED Notes (Signed)
Will hold off on given morphine and zofran to the pt. Pt. Sleepy.

## 2012-12-08 NOTE — Progress Notes (Signed)
Patient c/o chest pain.  Assessed vital signs and charted in doc flowsheet.  Patient reports attempting to sit up to go to the restroom and had sharp mid-sternal chest pain without radiation.  Reports pain was 8 out of 10.  VSS. Patient is not in distress.  As vitals signs were being assessed patient reported that chest pain was decreasing and is now a 2 out 10.  Called CMT to determine if there were any cardiac changes on telemetry.  CMT personnel reports no acute changes during time frame when patient was having chest pain.  Paged the MD to inform of incident.

## 2012-12-08 NOTE — ED Provider Notes (Signed)
History     CSN: [id number]  Arrival date & time 12/07/12  2346   First MD Initiated Contact with Patient 12/08/12 0026      Chief Complaint  Patient presents with  . Hypertension  . Near Syncope    (Consider location/radiation/quality/duration/timing/severity/associated sxs/prior treatment) HPI Hx per PT and family bedside - Syncope tonight, witnessed, PT has been under a great deal of stress at home preparing for family gathering and daughters HS graduation.  She passed out tonight, brief LOC, no injury or seizure activity. She then later developed left sided non radiating CP described as heaviness. She denies any h/o same.  She was found to be hypertensive and admits to not taking her HTN medications today. No ABD pain, vag bleeding, N/V/D, rash or recent travel.  She tells me she has had syncope in the past and has been evaluated for it.   Past Medical History  Diagnosis Date  . Obesity   . Heart murmur   . Borderline diabetes mellitus     Past Surgical History  Procedure Laterality Date  . Tubal ligation    . Cyst excision      Family History  Problem Relation Age of Onset  . Cancer Other   . Hyperlipidemia Other   . Hypertension Other     History  Substance Use Topics  . Smoking status: Never Smoker   . Smokeless tobacco: Never Used  . Alcohol Use: No    OB History   Grav Para Term Preterm Abortions TAB SAB Ect Mult Living                  Review of Systems  Constitutional: Negative for fever and chills.  HENT: Negative for neck pain and neck stiffness.   Eyes: Negative for pain.  Respiratory: Negative for shortness of breath.   Cardiovascular: Positive for chest pain.  Gastrointestinal: Negative for abdominal pain.  Genitourinary: Negative for dysuria.  Musculoskeletal: Negative for back pain.  Skin: Negative for rash.  Neurological: Positive for syncope.  All other systems reviewed and are negative.    Allergies  Penicillins and  Latex  Home Medications   Current Outpatient Rx  Name  Route  Sig  Dispense  Refill  . albuterol (PROVENTIL HFA;VENTOLIN HFA) 108 (90 BASE) MCG/ACT inhaler   Inhalation   Inhale 2 puffs into the lungs every 6 (six) hours as needed. Wheezing and shortness of breath         . albuterol (PROVENTIL) (2.5 MG/3ML) 0.083% nebulizer solution   Nebulization   Take 2.5 mg by nebulization every 6 (six) hours as needed. For shortness of breath         . amLODipine (NORVASC) 10 MG tablet   Oral   Take 5 mg by mouth daily. Take a half tablet daily         . Norethin Ace-Eth Estrad-FE (MINASTRIN 24 FE PO)   Oral   Take 1 tablet by mouth daily.         . Olopatadine HCl (PATADAY) 0.2 % SOLN   Both Eyes   Place 1 drop into both eyes daily.         Marland Kitchen triamcinolone (KENALOG) 0.025 % ointment   Topical   Apply topically 2 (two) times daily. Do not apply to face   15 g   0     BP 151/86  Pulse 96  Temp(Src) 98.7 F (37.1 C) (Oral)  Resp 14  SpO2 97%  LMP 11/24/2012  Physical Exam  Constitutional: She is oriented to person, place, and time. She appears well-developed and well-nourished.  HENT:  Head: Normocephalic and atraumatic.  Eyes: EOM are normal. Pupils are equal, round, and reactive to light.  Neck: Neck supple.  Cardiovascular: Regular rhythm and intact distal pulses.   Murmur heard. Tachycardic and hypertensive  Pulmonary/Chest: Effort normal and breath sounds normal. No respiratory distress.  Abdominal: Soft. Bowel sounds are normal. She exhibits no distension. There is no tenderness.  Musculoskeletal: Normal range of motion. She exhibits no edema.  Neurological: She is alert and oriented to person, place, and time. No cranial nerve deficit.  Skin: Skin is warm and dry.    ED Course  Procedures (including critical care time)  Results for orders placed during the hospital encounter of 12/07/12  CBC      Result Value Range   WBC 5.5  4.0 - 10.5 K/uL   RBC  4.47  3.87 - 5.11 MIL/uL   Hemoglobin 13.9  12.0 - 15.0 g/dL   HCT 16.1  09.6 - 04.5 %   MCV 87.5  78.0 - 100.0 fL   MCH 31.1  26.0 - 34.0 pg   MCHC 35.5  30.0 - 36.0 g/dL   RDW 40.9  81.1 - 91.4 %   Platelets 186  150 - 400 K/uL  D-DIMER, QUANTITATIVE      Result Value Range   D-Dimer, Quant 0.28  0.00 - 0.48 ug/mL-FEU  URINALYSIS, ROUTINE W REFLEX MICROSCOPIC      Result Value Range   Color, Urine YELLOW  YELLOW   APPearance CLOUDY (*) CLEAR   Specific Gravity, Urine 1.007  1.005 - 1.030   pH 6.5  5.0 - 8.0   Glucose, UA NEGATIVE  NEGATIVE mg/dL   Hgb urine dipstick SMALL (*) NEGATIVE   Bilirubin Urine NEGATIVE  NEGATIVE   Ketones, ur NEGATIVE  NEGATIVE mg/dL   Protein, ur NEGATIVE  NEGATIVE mg/dL   Urobilinogen, UA 1.0  0.0 - 1.0 mg/dL   Nitrite NEGATIVE  NEGATIVE   Leukocytes, UA NEGATIVE  NEGATIVE  PREGNANCY, URINE      Result Value Range   Preg Test, Ur NEGATIVE  NEGATIVE  URINE MICROSCOPIC-ADD ON      Result Value Range   Squamous Epithelial / LPF FEW (*) RARE   RBC / HPF 0-2  <3 RBC/hpf   Bacteria, UA RARE  RARE  POCT I-STAT, CHEM 8      Result Value Range   Sodium 141  135 - 145 mEq/L   Potassium 3.7  3.5 - 5.1 mEq/L   Chloride 108  96 - 112 mEq/L   BUN 9  6 - 23 mg/dL   Creatinine, Ser 7.82  0.50 - 1.10 mg/dL   Glucose, Bld 956 (*) 70 - 99 mg/dL   Calcium, Ion 2.13  0.86 - 1.23 mmol/L   TCO2 23  0 - 100 mmol/L   Hemoglobin 13.3  12.0 - 15.0 g/dL   HCT 57.8  46.9 - 62.9 %  POCT I-STAT TROPONIN I      Result Value Range   Troponin i, poc 0.00  0.00 - 0.08 ng/mL   Comment 3             Date: 12/08/2012  Rate: 79  Rhythm: normal sinus rhythm  QRS Axis: normal  Intervals: normal  ST/T Wave abnormalities: nonspecific ST changes  Conduction Disutrbances:none  Narrative Interpretation:   Old EKG Reviewed: unchanged  Dg Chest Portable 1  View  12/08/2012   *RADIOLOGY REPORT*  Clinical Data: Found unconscious.  Hypertension.  PORTABLE CHEST - 1 VIEW   Comparison: 06/04/2012.  Findings: Poor inspiration.  Enlarged cardiac silhouette, magnified by the poor inspiration and portable AP technique.  Prominent pulmonary vasculature without gross change.  Mild scoliosis.  IMPRESSION: Poor inspiration with grossly stable cardiomegaly and pulmonary vascular congestion.   Original Report Authenticated By: Beckie Salts, M.D.   ASA, NTG PTA  1:35 AM d/w Dr Algie Coffer - will evaluate in ED. Plan admit  MDM  CP/ syncope  ECG, labs, CXR  Home HTN medications provided  CAR consult/ admit      Sunnie Nielsen, MD 12/08/12 [phone]

## 2012-12-08 NOTE — Progress Notes (Signed)
ANTICOAGULATION CONSULT NOTE - Follow Up Consult  Pharmacy Consult for Heparin Indication: chest pain/ACS  Allergies  Allergen Reactions  . Penicillins Anaphylaxis  . Latex Rash    Patient Measurements: Height: 4\' 11"  (149.9 cm) Weight: 166 lb 3.2 oz (75.388 kg) IBW/kg (Calculated) : 43.2 Heparin Dosing Weight:   Vital Signs: Temp: 98.2 F (36.8 C) (06/14 0434) Temp src: Oral (06/14 0434) BP: 120/75 mmHg (06/14 0926) Pulse Rate: 79 (06/14 0926)  Labs:  Recent Labs  12/08/12 12/08/12 0017 12/08/12 0605 12/08/12 0615 12/08/12 1028  HGB 13.9 13.3  --  13.6  --   HCT 39.1 39.0  --  38.1  --   PLT 186  --   --  169  --   LABPROT  --   --   --  15.3*  --   INR  --   --   --  1.23  --   HEPARINUNFRC  --   --   --   --  0.25*  CREATININE  --  0.90  --  0.72  --   TROPONINI  --   --  <0.30  --   --     Estimated Creatinine Clearance: 82.8 ml/min (by C-G formula based on Cr of 0.72).   Assessment: Syncope and CP  Anticoagulation: To begin heparin for r/o ACS. CBC stable at baseline. No anticoagulants PTA. Baseline INR 1.23? Heparin level 0.25.  Cardiovascular: congenital ventricular septum defect. CXR=cardiomegaly and vascular congestion. Enzymes currently negative., BP as high as 180/112 now 120/75 on Norvasc 5mg , ASA 81mg , Coreg, Zocor  Endocrinology: Borderline DM  Gastrointestinal / Nutrition: Obesity  Nephrology: Scr 0.72 with CrCl 82  Pulmonary: Severe pulmonary HTN.  Hematology / Oncology: CBC WNL  PTA Medication Issues: norvasc dose  Goal of Therapy:  Heparin level 0.3-0.7 units/ml Monitor platelets by anticoagulation protocol: Yes   Plan:  Stress test tomorrow. Increase IV heparin to 900 uts/hr and recheck in 6 hrs.    Gaylord Seydel S. Merilynn Finland, PharmD, BCPS Clinical Staff Pharmacist Pager [phone]  Misty Stanley Stillinger 12/08/2012,11:23 AM

## 2012-12-08 NOTE — Progress Notes (Signed)
ANTICOAGULATION CONSULT NOTE - Follow Up Consult  Pharmacy Consult for Heparin Indication: chest pain/ACS  Allergies  Allergen Reactions  . Penicillins Anaphylaxis  . Latex Rash    Patient Measurements: Height: 4\' 11"  (149.9 cm) Weight: 166 lb 3.2 oz (75.388 kg) IBW/kg (Calculated) : 43.2 Heparin Dosing Weight: 43 kg  Vital Signs: Temp: 98.3 F (36.8 C) (06/14 1400) Temp src: Oral (06/14 1400) BP: 126/68 mmHg (06/14 1400) Pulse Rate: 83 (06/14 1400)  Labs:  Recent Labs  12/08/12 12/08/12 0017 12/08/12 0605 12/08/12 0615 12/08/12 1028 12/08/12 1729  HGB 13.9 13.3  --  13.6  --   --   HCT 39.1 39.0  --  38.1  --   --   PLT 186  --   --  169  --   --   LABPROT  --   --   --  15.3*  --   --   INR  --   --   --  1.23  --   --   HEPARINUNFRC  --   --   --   --  0.25* 0.33  CREATININE  --  0.90  --  0.72  --   --   TROPONINI  --   --  <0.30  --  <0.30  --     Estimated Creatinine Clearance: 82.8 ml/min (by C-G formula based on Cr of 0.72).   Assessment:  42 yo F continues on heparin for chest pain.  Heparin is now therapeutic on 900 units/hr.  No change needed.  Goal of Therapy:  Heparin level 0.3-0.7 units/ml Monitor platelets by anticoagulation protocol: Yes   Plan:  Continue heparin at 900 units/hr. Follow-up with AM heparin level. Follow-up results of stress test planned for 6/15.  [employer], Pharm.D., BCPS Clinical Pharmacist Pager [phone] 12/08/2012 6:21 PM

## 2012-12-08 NOTE — Progress Notes (Signed)
Called down NM and stress test to be done in the AM tomorrow.

## 2012-12-08 NOTE — H&P (Signed)
Mackenzie Patterson is an 42 y.o. female.   Chief Complaint: Chest pain HPI: 42 years old female with congenital ventricular septum defect had syncopal episode, followed by chest pain. No fever or cough. No radiation. Chest  x-ray showing cardiomegaly and vascular congestion.   Past Medical History  Diagnosis Date  . Obesity   . Heart murmur   . Borderline diabetes mellitus       Past Surgical History  Procedure Laterality Date  . Tubal ligation    . Cyst excision      Family History  Problem Relation Age of Onset  . Cancer Other   . Hyperlipidemia Other   . Hypertension Other    Social History:  reports that she has never smoked. She has never used smokeless tobacco. She reports that she does not drink alcohol or use illicit drugs.  Allergies:  Allergies  Allergen Reactions  . Penicillins Anaphylaxis  . Latex Rash     (Not in a hospital admission)  Results for orders placed during the hospital encounter of 12/07/12 (from the past 48 hour(s))  CBC     Status: None   Collection Time    12/08/12 12:00 AM      Result Value Range   WBC 5.5  4.0 - 10.5 K/uL   RBC 4.47  3.87 - 5.11 MIL/uL   Hemoglobin 13.9  12.0 - 15.0 g/dL   HCT 96.0  45.4 - 09.8 %   MCV 87.5  78.0 - 100.0 fL   MCH 31.1  26.0 - 34.0 pg   MCHC 35.5  30.0 - 36.0 g/dL   RDW 11.9  14.7 - 82.9 %   Platelets 186  150 - 400 K/uL  POCT I-STAT TROPONIN I     Status: None   Collection Time    12/08/12 12:14 AM      Result Value Range   Troponin i, poc 0.00  0.00 - 0.08 ng/mL   Comment 3            Comment: Due to the release kinetics of cTnI,     a negative result within the first hours     of the onset of symptoms does not rule out     myocardial infarction with certainty.     If myocardial infarction is still suspected,     repeat the test at appropriate intervals.  POCT I-STAT, CHEM 8     Status: Abnormal   Collection Time    12/08/12 12:17 AM      Result Value Range   Sodium 141  135 - 145 mEq/L   Potassium 3.7  3.5 - 5.1 mEq/L   Chloride 108  96 - 112 mEq/L   BUN 9  6 - 23 mg/dL   Creatinine, Ser 5.62  0.50 - 1.10 mg/dL   Glucose, Bld 130 (*) 70 - 99 mg/dL   Calcium, Ion 8.65  7.84 - 1.23 mmol/L   TCO2 23  0 - 100 mmol/L   Hemoglobin 13.3  12.0 - 15.0 g/dL   HCT 69.6  29.5 - 28.4 %  D-DIMER, QUANTITATIVE     Status: None   Collection Time    12/08/12 12:44 AM      Result Value Range   D-Dimer, Quant 0.28  0.00 - 0.48 ug/mL-FEU   Comment:            AT THE INHOUSE ESTABLISHED CUTOFF     VALUE OF 0.48 ug/mL FEU,  THIS ASSAY HAS BEEN DOCUMENTED     IN THE LITERATURE TO HAVE     A SENSITIVITY AND NEGATIVE     PREDICTIVE VALUE OF AT LEAST     98 TO 99%.  THE TEST RESULT     SHOULD BE CORRELATED WITH     AN ASSESSMENT OF THE CLINICAL     PROBABILITY OF DVT / VTE.  URINALYSIS, ROUTINE W REFLEX MICROSCOPIC     Status: Abnormal   Collection Time    12/08/12  1:31 AM      Result Value Range   Color, Urine YELLOW  YELLOW   APPearance CLOUDY (*) CLEAR   Specific Gravity, Urine 1.007  1.005 - 1.030   pH 6.5  5.0 - 8.0   Glucose, UA NEGATIVE  NEGATIVE mg/dL   Hgb urine dipstick SMALL (*) NEGATIVE   Bilirubin Urine NEGATIVE  NEGATIVE   Ketones, ur NEGATIVE  NEGATIVE mg/dL   Protein, ur NEGATIVE  NEGATIVE mg/dL   Urobilinogen, UA 1.0  0.0 - 1.0 mg/dL   Nitrite NEGATIVE  NEGATIVE   Leukocytes, UA NEGATIVE  NEGATIVE  PREGNANCY, URINE     Status: None   Collection Time    12/08/12  1:31 AM      Result Value Range   Preg Test, Ur NEGATIVE  NEGATIVE   Comment:            THE SENSITIVITY OF THIS     METHODOLOGY IS >20 mIU/mL.  URINE MICROSCOPIC-ADD ON     Status: Abnormal   Collection Time    12/08/12  1:31 AM      Result Value Range   Squamous Epithelial / LPF FEW (*) RARE   RBC / HPF 0-2  <3 RBC/hpf   Bacteria, UA RARE  RARE   Dg Chest Portable 1 View  12/08/2012   *RADIOLOGY REPORT*  Clinical Data: Found unconscious.  Hypertension.  PORTABLE CHEST - 1 VIEW   Comparison: 06/04/2012.  Findings: Poor inspiration.  Enlarged cardiac silhouette, magnified by the poor inspiration and portable AP technique.  Prominent pulmonary vasculature without gross change.  Mild scoliosis.  IMPRESSION: Poor inspiration with grossly stable cardiomegaly and pulmonary vascular congestion.   Original Report Authenticated By: Beckie Salts, M.D.    @ROS @ Constitutional: Negative for fever.  Respiratory: Negative for shortness of breath.  Cardiovascular: Negative for chest pain.  Gastrointestinal: Negative for nausea, vomiting, abdominal pain and diarrhea.   Physical Exam  Blood pressure 150/94, pulse 89, temperature 98.7 F (37.1 C), temperature source Oral, resp. rate 29, last menstrual period 11/24/2012, SpO2 96.00%.  Constitutional: She is well-developed and well-nourished. No distress.  HENT: Head: Normocephalic. Mouth/Throat: Oropharynx is clear and moist.  Eyes: Conjunctivae and EOM are normal. Pupils are equal, round, and reactive to light.  Neck: Normal range of motion. Neck supple.  Cardiovascular: Normal rate. IV/VI systolic murmur and II/VI diastolic murmur at right and left sternal borders. Pulmonary/Chest: Effort normal. No stridor.  Abdominal: Soft.  Musculoskeletal: Normal range of motion.  Neurological: She is alert and oriented to person, place, and time.  Psychiatric: She has a normal mood and affect.   Assessment/Plan Chest pain Congenital membranous VSD Severe pulmonary hypertension  Admit/ home medications/Echocardiogram  Ian Castagna S 12/08/2012, 2:36 AM

## 2012-12-08 NOTE — Progress Notes (Signed)
Echocardiogram completed.

## 2012-12-08 NOTE — ED Notes (Signed)
Gave pt. 1/2 tab of lisinipril per Dr. Dierdre Highman.

## 2012-12-08 NOTE — Progress Notes (Signed)
ANTICOAGULATION CONSULT NOTE - Initial Consult  Pharmacy Consult for Heparin Indication: chest pain/ACS  Allergies  Allergen Reactions  . Penicillins Anaphylaxis  . Latex Rash    Patient Measurements: Height: 4\' 11"  (149.9 cm) Weight: 168 lb (76.204 kg) IBW/kg (Calculated) : 43.2 Heparin Dosing Weight: 60 kg  Vital Signs: Temp: 98.7 F (37.1 C) (06/13 2354) Temp src: Oral (06/13 2354) BP: 150/94 mmHg (06/14 0215) Pulse Rate: 89 (06/14 0215)  Labs:  Recent Labs  12/08/12 12/08/12 0017  HGB 13.9 13.3  HCT 39.1 39.0  PLT 186  --   CREATININE  --  0.90    Estimated Creatinine Clearance: 74 ml/min (by C-G formula based on Cr of 0.9).   Medical History: Past Medical History  Diagnosis Date  . Obesity   . Heart murmur   . Borderline diabetes mellitus     Medications:  See electronic med rec  Assessment: 42 y.o. female presents with chest pain. Noted pt with congenital ventricular septum defect. To begin heparin for r/o ACS. CBC stable at baseline. No anticoagulants PTA.  Goal of Therapy:  Heparin level 0.3-0.7 units/ml Monitor platelets by anticoagulation protocol: Yes   Plan:  1. Heparin IV bolus 4000 units 2. Heparin gtt at 750 units/hr 3. Will f/u 6 hr heparin level 4. Daily heparin level and CBC  Christoper Fabian, PharmD, BCPS Clinical pharmacist, pager [phone] 12/08/2012,3:08 AM

## 2012-12-09 ENCOUNTER — Observation Stay (HOSPITAL_COMMUNITY): Payer: Medicaid Other

## 2012-12-09 ENCOUNTER — Other Ambulatory Visit: Payer: Self-pay

## 2012-12-09 LAB — CBC
HCT: 38.8 % (ref 36.0–46.0)
Hemoglobin: 13.6 g/dL (ref 12.0–15.0)
MCHC: 35.1 g/dL (ref 30.0–36.0)
RDW: 11.9 % (ref 11.5–15.5)
WBC: 6 10*3/uL (ref 4.0–10.5)

## 2012-12-09 LAB — HEPARIN LEVEL (UNFRACTIONATED): Heparin Unfractionated: 0.31 IU/mL (ref 0.30–0.70)

## 2012-12-09 MED ORDER — TECHNETIUM TC 99M SESTAMIBI GENERIC - CARDIOLITE
10.0000 | Freq: Once | INTRAVENOUS | Status: AC | PRN
  Administered 2012-12-09: 10 via INTRAVENOUS

## 2012-12-09 MED ORDER — REGADENOSON 0.4 MG/5ML IV SOLN
0.4000 mg | Freq: Once | INTRAVENOUS | Status: AC
  Administered 2012-12-09: 0.4 mg via INTRAVENOUS
  Filled 2012-12-09: qty 5

## 2012-12-09 MED ORDER — NITROGLYCERIN 0.4 MG SL SUBL: 0.4000 mg | SUBLINGUAL_TABLET | SUBLINGUAL | Status: DC | PRN

## 2012-12-09 MED ORDER — CARVEDILOL 3.125 MG PO TABS: 3.1250 mg | ORAL_TABLET | Freq: Two times a day (BID) | ORAL | Status: DC

## 2012-12-09 MED ORDER — TECHNETIUM TC 99M SESTAMIBI - CARDIOLITE
30.0000 | Freq: Once | INTRAVENOUS | Status: AC | PRN
  Administered 2012-12-09: 10:00:00 30 via INTRAVENOUS

## 2012-12-09 MED ORDER — HEPARIN (PORCINE) IN NACL 100-0.45 UNIT/ML-% IJ SOLN
1000.0000 [IU]/h | INTRAMUSCULAR | Status: DC
  Filled 2012-12-09: qty 250

## 2012-12-09 NOTE — Discharge Summary (Signed)
Physician Discharge Summary  [name] MRN: [id number] DOB/AGE: 1971/06/07 42 y.o.  Admit date: 12/07/2012 Discharge date: 12/09/2012  Admission Diagnoses: Chest pain  Congenital membranous VSD  Severe pulmonary hypertension  Discharge Diagnoses:  Principle Problem: * Chest pain * Congenital membranous VSD  Severe pulmonary hypertension   Discharged Condition: fair  Hospital Course: 42 years old female with congenital ventricular septum defect had syncopal episode, followed by chest pain. No fever or cough. No radiation. Her echocardiogram was stable and her nuclear stress test did not show reversible ischemia. She will be followed by her primary care cardiology in 1 week.   Consults: None  Significant Diagnostic Studies: labs: Normal CBC, BMET, D-dimer and pregnancy test.  EKG-normal sinus rhythm.  Lexiscan cardiolyte showed: No evidence for pharmacologically induced ischemia.  Normal wall motion and calculated ejection fraction is 62%.   Echocardiogram: - Left ventricle: The cavity size was mildly dilated. There was mild concentric hypertrophy. Systolic function was normal. The estimated ejection fraction was in the range of 60% to 65%. Wall motion was normal; there were no regional wall motion abnormalities. Doppler parameters are consistent with abnormal left ventricular relaxation (grade 1 diastolic dysfunction). - Ventricular septum: There was a congenital defect in the perimembranous region. - Aortic valve: Trivial regurgitation. - Tricuspid valve: Severe regurgitation. - Pulmonary arteries: Systolic pressure was severely increased.   Treatments: cardiac meds: carvedilol and amlodipine  Discharge Exam: Blood pressure 137/77, pulse 71, temperature 98.3 F (36.8 C), temperature source Oral, resp. rate 18, height 4\' 11"  (1.499 m), weight 77.565 kg (171 lb), last menstrual period 11/24/2012, SpO2 100.00%. HENT: Head: Normocephalic. Mouth/Throat:  Oropharynx is clear and moist.  Eyes: Conjunctivae and EOM are normal. Pupils are equal, round, and reactive to light.  Neck: Normal range of motion. Neck supple.  Cardiovascular: Normal rate. IV/VI systolic murmur and II/VI diastolic murmur at right and left sternal borders.  Pulmonary/Chest: Effort normal. No stridor.  Abdominal: Soft.  Musculoskeletal: Normal range of motion.  Neurological: She is alert and oriented to person, place, and time.  Psychiatric: She has a normal mood and affect   Disposition: 01-Home or Self Care     Medication List    TAKE these medications       albuterol 108 (90 BASE) MCG/ACT inhaler  Commonly known as:  PROVENTIL HFA;VENTOLIN HFA  Inhale 2 puffs into the lungs every 6 (six) hours as needed. Wheezing and shortness of breath     albuterol (2.5 MG/3ML) 0.083% nebulizer solution  Commonly known as:  PROVENTIL  Take 2.5 mg by nebulization every 6 (six) hours as needed. For shortness of breath     amLODipine 10 MG tablet  Commonly known as:  NORVASC  Take 5 mg by mouth daily. Take a half tablet daily     carvedilol 3.125 MG tablet  Commonly known as:  COREG  Take 1 tablet (3.125 mg total) by mouth 2 (two) times daily with a meal.     MINASTRIN 24 FE PO  Take 1 tablet by mouth daily.     nitroGLYCERIN 0.4 MG SL tablet  Commonly known as:  NITROSTAT  Place 1 tablet (0.4 mg total) under the tongue every 5 (five) minutes x 3 doses as needed for chest pain.     PATADAY 0.2 % Soln  Generic drug:  Olopatadine HCl  Place 1 drop into both eyes daily.     triamcinolone 0.025 % ointment  Commonly known as:  KENALOG  Apply topically  2 (two) times daily. Do not apply to face         Signed: Nilah Belcourt S 12/09/2012, 11:58 AM

## 2012-12-09 NOTE — Progress Notes (Signed)
ANTICOAGULATION CONSULT NOTE - Follow Up Consult  Pharmacy Consult for Heparin Indication: chest pain/ACS  Allergies  Allergen Reactions  . Penicillins Anaphylaxis  . Latex Rash    Patient Measurements: Height: 4\' 11"  (149.9 cm) Weight: 171 lb (77.565 kg) IBW/kg (Calculated) : 43.2 Heparin Dosing Weight: ~60kg  Vital Signs: Temp: 98.3 F (36.8 C) (06/15 0750) Temp src: Oral (06/15 0750) BP: 130/85 mmHg (06/15 0750) Pulse Rate: 71 (06/15 0750)  Labs:  Recent Labs  12/08/12 12/08/12 0017 12/08/12 0605 12/08/12 0615 12/08/12 1028 12/08/12 1728 12/08/12 1729 12/09/12 0525  HGB 13.9 13.3  --  13.6  --   --   --  13.6  HCT 39.1 39.0  --  38.1  --   --   --  38.8  PLT 186  --   --  169  --   --   --  191  LABPROT  --   --   --  15.3*  --   --   --   --   INR  --   --   --  1.23  --   --   --   --   HEPARINUNFRC  --   --   --   --  0.25*  --  0.33 0.31  CREATININE  --  0.90  --  0.72  --   --   --   --   TROPONINI  --   --  <0.30  --  <0.30 <0.30  --   --     Estimated Creatinine Clearance: 84.1 ml/min (by C-G formula based on Cr of 0.72).   Medications:  Heparin @ 900 units/hr  Assessment: 41yof continues on heparin with a therapeutic heparin level but on the low end of goal. CBC is stable. No bleeding reported. For stress test today.  Goal of Therapy:  Heparin level 0.3-0.7 units/ml Monitor platelets by anticoagulation protocol: Yes   Plan:  1) Patient on her way for stress test now -  increase heparin to 1000 units/hr when she gets back 2) Follow up heparin level and CBC in AM 3) Follow up stress test results  Fredrik Rigger 12/09/2012,7:59 AM

## 2012-12-09 NOTE — Progress Notes (Signed)
Pts assessment unchanged from this am. Stress test results called to Dr. Algie Coffer and pt d/c'd home with husband. She complained of intermittent dizziness that did not affect her walking but she stated she had to go home for her children's graduation and was going to leave. I let Dr. Algie Coffer know. This is apparently a chronic problem. Pt ambulated to the wheelchair without difficulty

## 2012-12-21 ENCOUNTER — Emergency Department (HOSPITAL_COMMUNITY)
Admission: EM | Admit: 2012-12-21 | Discharge: 2012-12-21 | Disposition: A | Discharge status: Home / Self Care | Payer: Medicaid Other | Attending: Emergency Medicine | Admitting: Emergency Medicine

## 2012-12-21 DIAGNOSIS — Z79899 Other long term (current) drug therapy: Secondary | ICD-10-CM | POA: Insufficient documentation

## 2012-12-21 DIAGNOSIS — Z862 Personal history of diseases of the blood and blood-forming organs and certain disorders involving the immune mechanism: Secondary | ICD-10-CM | POA: Insufficient documentation

## 2012-12-21 DIAGNOSIS — K121 Other forms of stomatitis: Secondary | ICD-10-CM | POA: Insufficient documentation

## 2012-12-21 DIAGNOSIS — R61 Generalized hyperhidrosis: Secondary | ICD-10-CM | POA: Insufficient documentation

## 2012-12-21 DIAGNOSIS — K029 Dental caries, unspecified: Secondary | ICD-10-CM | POA: Insufficient documentation

## 2012-12-21 DIAGNOSIS — E669 Obesity, unspecified: Secondary | ICD-10-CM | POA: Insufficient documentation

## 2012-12-21 DIAGNOSIS — K1230 Oral mucositis (ulcerative), unspecified: Secondary | ICD-10-CM | POA: Insufficient documentation

## 2012-12-21 DIAGNOSIS — Z91040 Latex allergy status: Secondary | ICD-10-CM | POA: Insufficient documentation

## 2012-12-21 DIAGNOSIS — Q210 Ventricular septal defect: Secondary | ICD-10-CM | POA: Insufficient documentation

## 2012-12-21 DIAGNOSIS — K089 Disorder of teeth and supporting structures, unspecified: Secondary | ICD-10-CM | POA: Insufficient documentation

## 2012-12-21 DIAGNOSIS — Z880 Allergy status to penicillin: Secondary | ICD-10-CM | POA: Insufficient documentation

## 2012-12-21 DIAGNOSIS — R110 Nausea: Secondary | ICD-10-CM | POA: Insufficient documentation

## 2012-12-21 DIAGNOSIS — B9789 Other viral agents as the cause of diseases classified elsewhere: Secondary | ICD-10-CM

## 2012-12-21 DIAGNOSIS — Z8639 Personal history of other endocrine, nutritional and metabolic disease: Secondary | ICD-10-CM | POA: Insufficient documentation

## 2012-12-21 DIAGNOSIS — K120 Recurrent oral aphthae: Secondary | ICD-10-CM

## 2012-12-21 DIAGNOSIS — R011 Cardiac murmur, unspecified: Secondary | ICD-10-CM | POA: Insufficient documentation

## 2012-12-21 MED ORDER — BENZOCAINE 20 % MT GEL: 1.0000 "application " | Freq: Three times a day (TID) | OROMUCOSAL | Status: DC | PRN

## 2012-12-21 NOTE — ED Notes (Signed)
Pt c/o swelling to top lip and blisters inside of top lip. Symptoms since June 19th. Pt with no obvious swelling. Pt with no acute distress.

## 2012-12-21 NOTE — ED Provider Notes (Signed)
History  This chart was scribed for  by Ladona Ridgel Day, ED scribe. This patient was seen in room WTR5/WTR5 and the patient's care was started at 1642.  CSN: [id number] Arrival date & time 12/21/12  1642  First MD Initiated Contact with Patient 12/21/12 1711     Chief Complaint  Patient presents with  . Oral Swelling   Patient is a 42 y.o. female presenting with tooth pain. The history is provided by the patient. No language interpreter was used.  Dental Pain Toothache location: inside of upper lip. Quality:  Burning Severity:  Mild Onset quality:  Gradual Duration:  8 days Timing:  Constant Progression:  Unchanged Chronicity:  New Relieved by:  Nothing Exacerbated by: eating/drinking. Ineffective treatments: cormex. Associated symptoms: no fever and no headaches   HPI Comments: Mackenzie Patterson is a 42 y.o. female who presents to the Emergency Department complaining of constant, gradually worsening, painful blister to the inside of her upper lip, onset 8 days ago. She states she now has lesions on the mucosa of the upper lip for 4 days that are extremely painful.  She states pain to upper lip especially w/eating. She tried carmex w/out relief.  She denies rash anywhere else on her body, peeling skin or bleeding of the ulcers.    She was recently admitted on June 13th for 2 days and d/c from hospital after having syncopal episodes with associated chest pain and has a known congenital ventricular septal defect. She states has been sweating more than usual lately which occurred along with her syncopal episodes. She states nausea, but denies emesis, diarrhea.  She denies chest pain, syncopal episode today, abd pain, lightheadedness, weakness, dizziness.  Past Medical History  Diagnosis Date  . Obesity   . Heart murmur   . Borderline diabetes mellitus   . Congenital ventricular septal defect    Past Surgical History  Procedure Laterality Date  . Tubal ligation    . Cyst excision      Family History  Problem Relation Age of Onset  . Cancer Other   . Hyperlipidemia Other   . Hypertension Other    History  Substance Use Topics  . Smoking status: Never Smoker   . Smokeless tobacco: Never Used  . Alcohol Use: No   OB History   Grav Para Term Preterm Abortions TAB SAB Ect Mult Living                 Review of Systems  Constitutional: Negative for fever, diaphoresis, appetite change, fatigue and unexpected weight change.  HENT: Negative for neck stiffness.        Fever blister top of her lip  Eyes: Negative for visual disturbance.  Respiratory: Negative for cough, chest tightness, shortness of breath and wheezing.   Cardiovascular: Negative for chest pain.  Gastrointestinal: Negative for nausea, vomiting, abdominal pain, diarrhea and constipation.  Endocrine: Negative for polydipsia, polyphagia and polyuria.  Genitourinary: Negative for dysuria, urgency, frequency and hematuria.  Musculoskeletal: Negative for back pain.  Skin: Negative for rash.  Allergic/Immunologic: Negative for immunocompromised state.  Neurological: Negative for syncope, light-headedness and headaches.  Hematological: Does not bruise/bleed easily.  Psychiatric/Behavioral: Negative for sleep disturbance. The patient is not nervous/anxious.   All other systems reviewed and are negative.  A complete 10 system review of systems was obtained and all systems are negative except as noted in the HPI and PMH.    Allergies  Penicillins and Latex  Home Medications   Current Outpatient Rx  Name  Route  Sig  Dispense  Refill  . albuterol (PROVENTIL HFA;VENTOLIN HFA) 108 (90 BASE) MCG/ACT inhaler   Inhalation   Inhale 2 puffs into the lungs every 6 (six) hours as needed. Wheezing and shortness of breath         . albuterol (PROVENTIL) (2.5 MG/3ML) 0.083% nebulizer solution   Nebulization   Take 2.5 mg by nebulization every 6 (six) hours as needed. For shortness of breath         .  amLODipine (NORVASC) 10 MG tablet   Oral   Take 5 mg by mouth daily. Take a half tablet daily         . carvedilol (COREG) 3.125 MG tablet   Oral   Take 1 tablet (3.125 mg total) by mouth 2 (two) times daily with a meal.   60 tablet   1   . Norethin Ace-Eth Estrad-FE (MINASTRIN 24 FE PO)   Oral   Take 1 tablet by mouth daily.         . Olopatadine HCl (PATADAY) 0.2 % SOLN   Both Eyes   Place 1 drop into both eyes daily.         . promethazine (PHENERGAN) 25 MG tablet   Oral   Take 25 mg by mouth every 6 (six) hours as needed for nausea (neausea).         . traMADol (ULTRAM) 50 MG tablet   Oral   Take 50 mg by mouth every 6 (six) hours as needed for pain (headache).         . triamcinolone (KENALOG) 0.025 % ointment   Topical   Apply topically 2 (two) times daily. Do not apply to face   15 g   0   . benzocaine (ORABASE MAXIMUM STRENGTH) 20 % GEL   Mouth/Throat   Use as directed 1 application in the mouth or throat 3 (three) times daily as needed.   15 g   0   . nitroGLYCERIN (NITROSTAT) 0.4 MG SL tablet   Sublingual   Place 1 tablet (0.4 mg total) under the tongue every 5 (five) minutes x 3 doses as needed for chest pain.   25 tablet   1    Triage Vitals: BP 119/74  Pulse 85  Temp(Src) 98.5 F (36.9 C) (Oral)  Resp 18  SpO2 100%  LMP 11/24/2012 Physical Exam  Nursing note and vitals reviewed. Constitutional: She appears well-developed and well-nourished.  HENT:  Head: Normocephalic.  Right Ear: Tympanic membrane, external ear and ear canal normal.  Left Ear: Tympanic membrane, external ear and ear canal normal.  Nose: Nose normal. Right sinus exhibits no maxillary sinus tenderness and no frontal sinus tenderness. Left sinus exhibits no maxillary sinus tenderness and no frontal sinus tenderness.  Mouth/Throat: Uvula is midline, oropharynx is clear and moist and mucous membranes are normal. Oral lesions present. Abnormal dentition. Dental caries  present. No edematous or lacerations. No oropharyngeal exudate, posterior oropharyngeal edema, posterior oropharyngeal erythema or tonsillar abscesses.  Discrete ulcerated lesions of the upper oral mucosa with clean grey base.  NO bleeding, induration or evidence of infection or abscess.  NO involvement of the oropharynx, soft or hard palate.     Eyes: Conjunctivae are normal. Pupils are equal, round, and reactive to light. Right eye exhibits no discharge. Left eye exhibits no discharge.  Neck: Normal range of motion. Neck supple.  Cardiovascular: Normal rate and regular rhythm.   Murmur heard. Pulmonary/Chest: Effort normal and breath  sounds normal. No respiratory distress. She has no wheezes.  Abdominal: Soft. Bowel sounds are normal. She exhibits no distension. There is no tenderness.  Lymphadenopathy:    She has no cervical adenopathy.  Neurological: She is alert.  Skin: Skin is warm and dry.  Psychiatric: She has a normal mood and affect.    ED Course  Procedures (including critical care time) DIAGNOSTIC STUDIES: Oxygen Saturation is 100% on room air, normal by my interpretation.    COORDINATION OF CARE: At 520 PM Discussed treatment plan with patient which includes benzocaine. Patient agrees.   Labs Reviewed - No data to display No results found. 1. Aphthous ulcer of mouth   2. 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  Pt with a Hx of same and presents today for symptom control.  Pt exam with discrete, ulcerated, nonindurated, nondraining lesions of the oral mucosa of the upper lip.  NO evidence of infection.  Lesions not consistent with Steven's Johnson Syndrome.  Will give symptomatic treatment and encouraged follow-up with PCP as needed.  I have also discussed reasons to return immediately to the ER.  Patient expresses understanding and agrees with plan.  I personally performed the services  described in this documentation, which was scribed in my presence. The recorded information has been reviewed and is accurate.   Dahlia Client Audley Hinojos, PA-C 12/21/12 1747

## 2012-12-21 NOTE — ED Provider Notes (Signed)
Medical screening examination/treatment/procedure(s) were performed by non-physician practitioner and as supervising physician I was immediately available for consultation/collaboration.   Ted Goodner, MD 12/21/12 2302 

## 2013-01-19 ENCOUNTER — Encounter: Payer: Self-pay | Admitting: Neurology

## 2013-01-21 ENCOUNTER — Ambulatory Visit: Payer: Medicaid Other | Admitting: Neurology

## 2013-02-02 ENCOUNTER — Encounter (HOSPITAL_COMMUNITY): Payer: Self-pay | Admitting: Emergency Medicine

## 2013-02-02 ENCOUNTER — Emergency Department (HOSPITAL_COMMUNITY)
Admission: EM | Admit: 2013-02-02 | Discharge: 2013-02-02 | Disposition: A | Discharge status: Home / Self Care | Payer: Medicaid Other | Attending: Emergency Medicine | Admitting: Emergency Medicine

## 2013-02-02 DIAGNOSIS — Y9389 Activity, other specified: Secondary | ICD-10-CM | POA: Insufficient documentation

## 2013-02-02 DIAGNOSIS — Z91040 Latex allergy status: Secondary | ICD-10-CM | POA: Insufficient documentation

## 2013-02-02 DIAGNOSIS — IMO0002 Reserved for concepts with insufficient information to code with codable children: Secondary | ICD-10-CM | POA: Insufficient documentation

## 2013-02-02 DIAGNOSIS — E669 Obesity, unspecified: Secondary | ICD-10-CM | POA: Insufficient documentation

## 2013-02-02 DIAGNOSIS — Z880 Allergy status to penicillin: Secondary | ICD-10-CM | POA: Insufficient documentation

## 2013-02-02 DIAGNOSIS — Y92009 Unspecified place in unspecified non-institutional (private) residence as the place of occurrence of the external cause: Secondary | ICD-10-CM | POA: Insufficient documentation

## 2013-02-02 DIAGNOSIS — Z79899 Other long term (current) drug therapy: Secondary | ICD-10-CM | POA: Insufficient documentation

## 2013-02-02 DIAGNOSIS — X500XXA Overexertion from strenuous movement or load, initial encounter: Secondary | ICD-10-CM | POA: Insufficient documentation

## 2013-02-02 DIAGNOSIS — J45909 Unspecified asthma, uncomplicated: Secondary | ICD-10-CM | POA: Insufficient documentation

## 2013-02-02 DIAGNOSIS — M549 Dorsalgia, unspecified: Secondary | ICD-10-CM

## 2013-02-02 MED ORDER — HYDROCODONE-ACETAMINOPHEN 5-325 MG PO TABS: 1.0000 | ORAL_TABLET | ORAL | Status: DC | PRN

## 2013-02-02 MED ORDER — HYDROCODONE-ACETAMINOPHEN 5-325 MG PO TABS
1.0000 | ORAL_TABLET | Freq: Once | ORAL | Status: AC
  Administered 2013-02-02: 1 via ORAL
  Filled 2013-02-02: qty 1

## 2013-02-02 MED ORDER — CYCLOBENZAPRINE HCL 10 MG PO TABS
5.0000 mg | ORAL_TABLET | Freq: Once | ORAL | Status: AC
  Administered 2013-02-02: 5 mg via ORAL
  Filled 2013-02-02: qty 1

## 2013-02-02 MED ORDER — CYCLOBENZAPRINE HCL 10 MG PO TABS: 5.0000 mg | ORAL_TABLET | Freq: Two times a day (BID) | ORAL | Status: DC | PRN

## 2013-02-02 NOTE — ED Provider Notes (Signed)
CSN: [id number]     Arrival date & time 02/02/13  1740 History  This chart was scribed for non-physician practitioner Teressa Lower, FNP, working with Derwood Kaplan, MD, by Yevette Edwards, ED Scribe. This patient was seen in room WTR6/WTR6 and the patient's care was started at 6:23 PM.   First MD Initiated Contact with Patient 02/02/13 1803     Chief Complaint  Patient presents with  . Back Pain    The history is provided by the patient. No language interpreter was used.   HPI Comments: Mackenzie Patterson is a 42 y.o. female who presents to the Emergency Department complaining of acute, constant back pain which occurred nine hours ago. The pt reports she had bent over to take a pair of pants out of a drawer and she could not straighten up. She describes her pain as "sharp," and she reports that the pain radiates from her right side to her left side with movement.  Exertion exacerbates the pain. The pt has not attempted to mitigate her pain with any medication. She denies a h/o back pain.   Past Medical History  Diagnosis Date  . Obesity   . Heart murmur   . Borderline diabetes mellitus   . Congenital ventricular septal defect    Past Surgical History  Procedure Laterality Date  . Tubal ligation    . Cyst excision     Family History  Problem Relation Age of Onset  . Cancer Other   . Hyperlipidemia Other   . Hypertension Other    History  Substance Use Topics  . Smoking status: Never Smoker   . Smokeless tobacco: Never Used  . Alcohol Use: No   No OB history provided.  Review of Systems  Musculoskeletal: Positive for back pain.  All other systems reviewed and are negative.    Allergies  Penicillins and Latex  Home Medications   Current Outpatient Rx  Name  Route  Sig  Dispense  Refill  . albuterol (PROVENTIL HFA;VENTOLIN HFA) 108 (90 BASE) MCG/ACT inhaler   Inhalation   Inhale 2 puffs into the lungs every 6 (six) hours as needed. Wheezing and shortness of breath         . albuterol (PROVENTIL) (2.5 MG/3ML) 0.083% nebulizer solution   Nebulization   Take 2.5 mg by nebulization every 6 (six) hours as needed. For shortness of breath         . amLODipine (NORVASC) 10 MG tablet   Oral   Take 5 mg by mouth daily. Take a half tablet daily         . carvedilol (COREG) 3.125 MG tablet   Oral   Take 1 tablet (3.125 mg total) by mouth 2 (two) times daily with a meal.   60 tablet   1   . Norethin Ace-Eth Estrad-FE (MINASTRIN 24 FE PO)   Oral   Take 1 tablet by mouth daily.         . Olopatadine HCl (PATADAY) 0.2 % SOLN   Both Eyes   Place 1 drop into both eyes daily.         . promethazine (PHENERGAN) 25 MG tablet   Oral   Take 25 mg by mouth every 6 (six) hours as needed for nausea (neausea).         . traMADol (ULTRAM) 50 MG tablet   Oral   Take 50 mg by mouth every 6 (six) hours as needed for pain (headache).         Marland Kitchen  triamcinolone (KENALOG) 0.025 % ointment   Topical   Apply topically 2 (two) times daily. Do not apply to face   15 g   0   . nitroGLYCERIN (NITROSTAT) 0.4 MG SL tablet   Sublingual   Place 1 tablet (0.4 mg total) under the tongue every 5 (five) minutes x 3 doses as needed for chest pain.   25 tablet   1    Triage Vitals: BP 133/82  Pulse 88  Temp(Src) 98.6 F (37 C) (Oral)  Resp 19  SpO2 96%  LMP 01/12/2013  Physical Exam  Nursing note and vitals reviewed. Constitutional: She is oriented to person, place, and time. She appears well-developed and well-nourished. No distress.  HENT:  Head: Normocephalic and atraumatic.  Eyes: EOM are normal.  Neck: Neck supple. No tracheal deviation present.  Cardiovascular: Normal rate.   Murmur heard. Pulmonary/Chest: Effort normal. No respiratory distress.  Musculoskeletal: Normal range of motion. She exhibits tenderness.  Right lateral lumbar tenderness with palpation.   Neurological: She is alert and oriented to person, place, and time.  Skin: Skin is  warm and dry.  Psychiatric: She has a normal mood and affect. Her behavior is normal.    ED Course   DIAGNOSTIC STUDIES:  Oxygen Saturation is 96% on room air, normal by my interpretation.    COORDINATION OF CARE:  6:25 PM- Discussed treatment plan with patient which includes pain medication and a muscle relaxer, and the patient agreed to the plan.   Procedures (including critical care time)  Labs Reviewed - No data to display No results found. 1. Back pain     MDM  Pt to be treated symptomatically:likely muscle spasms:pt not having any neuro deficits  I personally performed the services described in this documentation, which was scribed in my presence. The recorded information has been reviewed and is accurate.   Teressa Lower, NP 02/02/13 [phone]

## 2013-02-02 NOTE — ED Notes (Signed)
Pt states that she bent over to get a pai of pants and couldn't get back up. Happened around 0945 this morning and the pain hasnt gotten any better. Pt hasnt taking any meds for the pain.

## 2013-02-02 NOTE — ED Notes (Signed)
Pt denies numbness/tingling and loss of bowel and bladder control.

## 2013-02-04 ENCOUNTER — Emergency Department (HOSPITAL_COMMUNITY)
Admission: EM | Admit: 2013-02-04 | Discharge: 2013-02-04 | Disposition: A | Discharge status: Home / Self Care | Payer: Medicaid Other | Attending: Emergency Medicine | Admitting: Emergency Medicine

## 2013-02-04 ENCOUNTER — Emergency Department (HOSPITAL_COMMUNITY): Payer: Medicaid Other

## 2013-02-04 ENCOUNTER — Encounter (HOSPITAL_COMMUNITY): Payer: Self-pay | Admitting: Emergency Medicine

## 2013-02-04 DIAGNOSIS — R059 Cough, unspecified: Secondary | ICD-10-CM | POA: Insufficient documentation

## 2013-02-04 DIAGNOSIS — R05 Cough: Secondary | ICD-10-CM | POA: Insufficient documentation

## 2013-02-04 DIAGNOSIS — Z880 Allergy status to penicillin: Secondary | ICD-10-CM | POA: Insufficient documentation

## 2013-02-04 DIAGNOSIS — E669 Obesity, unspecified: Secondary | ICD-10-CM | POA: Insufficient documentation

## 2013-02-04 DIAGNOSIS — Z91040 Latex allergy status: Secondary | ICD-10-CM | POA: Insufficient documentation

## 2013-02-04 DIAGNOSIS — J45909 Unspecified asthma, uncomplicated: Secondary | ICD-10-CM | POA: Insufficient documentation

## 2013-02-04 DIAGNOSIS — I071 Rheumatic tricuspid insufficiency: Secondary | ICD-10-CM

## 2013-02-04 DIAGNOSIS — Z79899 Other long term (current) drug therapy: Secondary | ICD-10-CM | POA: Insufficient documentation

## 2013-02-04 DIAGNOSIS — I079 Rheumatic tricuspid valve disease, unspecified: Secondary | ICD-10-CM | POA: Insufficient documentation

## 2013-02-04 DIAGNOSIS — I272 Pulmonary hypertension, unspecified: Secondary | ICD-10-CM

## 2013-02-04 DIAGNOSIS — R011 Cardiac murmur, unspecified: Secondary | ICD-10-CM | POA: Insufficient documentation

## 2013-02-04 DIAGNOSIS — R0600 Dyspnea, unspecified: Secondary | ICD-10-CM

## 2013-02-04 DIAGNOSIS — Q210 Ventricular septal defect: Secondary | ICD-10-CM | POA: Insufficient documentation

## 2013-02-04 DIAGNOSIS — I270 Primary pulmonary hypertension: Secondary | ICD-10-CM | POA: Insufficient documentation

## 2013-02-04 LAB — CBC
HCT: 41.2 % (ref 36.0–46.0)
Hemoglobin: 14.9 g/dL (ref 12.0–15.0)
MCH: 31.4 pg (ref 26.0–34.0)
MCHC: 36.2 g/dL — ABNORMAL HIGH (ref 30.0–36.0)
RDW: 12.2 % (ref 11.5–15.5)

## 2013-02-04 LAB — BASIC METABOLIC PANEL
BUN: 11 mg/dL (ref 6–23)
Calcium: 9.9 mg/dL (ref 8.4–10.5)
Creatinine, Ser: 0.73 mg/dL (ref 0.50–1.10)
GFR calc Af Amer: >90 mL/min (ref >90)
GFR calc non Af Amer: >90 mL/min (ref >90)
Glucose, Bld: 169 mg/dL — ABNORMAL HIGH (ref 70–99)

## 2013-02-04 NOTE — ED Notes (Signed)
Patient states that she is having trouble catching her breath. The patient reports that it just started this after noon. Patient denies any history of any breathing troubles in the past

## 2013-02-04 NOTE — ED Provider Notes (Signed)
CSN: [id number]     Arrival date & time 02/04/13  1915 History     First MD Initiated Contact with Patient 02/04/13 2111     Chief Complaint  Patient presents with  . Shortness of Breath   (Consider location/radiation/quality/duration/timing/severity/associated sxs/prior Treatment) Patient is a 42 y.o. female presenting with shortness of breath. The history is provided by the patient.  Shortness of Breath Associated symptoms: no abdominal pain, no chest pain, no cough, no fever, no headaches, no neck pain and no rash   pt c/o feeling as if has to take a full/deep breath for past day. States doesn't feel sob per se, but feels as if has to get a full breath in.  No chest pain or discomfort. No pleuritic pain or discomfort. No orthopnea or pnd. Denies cough or uri c/o. No fever or chills. No leg pain or swelling. No recent trauma, surgery, or immobility. No hx dvt or pe. Denies recent change in meds or new meds. Notes hx vsd/heart murmur - states sees Dr Shana Chute, prior echos/eval for same.       Past Medical History  Diagnosis Date  . Obesity   . Heart murmur   . Borderline diabetes mellitus   . Congenital ventricular septal defect    Past Surgical History  Procedure Laterality Date  . Tubal ligation    . Cyst excision     Family History  Problem Relation Age of Onset  . Cancer Other   . Hyperlipidemia Other   . Hypertension Other    History  Substance Use Topics  . Smoking status: Never Smoker   . Smokeless tobacco: Never Used  . Alcohol Use: No   OB History   Grav Para Term Preterm Abortions TAB SAB Ect Mult Living                 Review of Systems  Constitutional: Negative for fever and chills.  HENT: Negative for neck pain.   Eyes: Negative for redness.  Respiratory: Negative for cough.   Cardiovascular: Negative for chest pain, palpitations and leg swelling.  Gastrointestinal: Negative for abdominal pain.  Genitourinary: Negative for flank pain.   Musculoskeletal: Negative for back pain.  Skin: Negative for rash.  Neurological: Negative for headaches.  Hematological: Does not bruise/bleed easily.  Psychiatric/Behavioral: Negative for confusion.    Allergies  Penicillins and Latex  Home Medications   Current Outpatient Rx  Name  Route  Sig  Dispense  Refill  . albuterol (PROVENTIL HFA;VENTOLIN HFA) 108 (90 BASE) MCG/ACT inhaler   Inhalation   Inhale 2 puffs into the lungs every 6 (six) hours as needed. Wheezing and shortness of breath         . albuterol (PROVENTIL) (2.5 MG/3ML) 0.083% nebulizer solution   Nebulization   Take 2.5 mg by nebulization every 6 (six) hours as needed. For shortness of breath         . amLODipine (NORVASC) 10 MG tablet   Oral   Take 5 mg by mouth daily. Take a half tablet daily         . Calcium-Phosphorus-Vitamin D (CALCIUM GUMMIES) 250-100-500 MG-MG-UNIT CHEW   Oral   Chew 2 tablets by mouth daily.         . carvedilol (COREG) 3.125 MG tablet   Oral   Take 1 tablet (3.125 mg total) by mouth 2 (two) times daily with a meal.   60 tablet   1   . cyclobenzaprine (FLEXERIL) 10 MG tablet  Oral   Take 0.5 tablets (5 mg total) by mouth 2 (two) times daily as needed for muscle spasms.   20 tablet   0   . HYDROcodone-acetaminophen (NORCO/VICODIN) 5-325 MG per tablet   Oral   Take 1 tablet by mouth every 4 (four) hours as needed for pain.   20 tablet   0   . Multiple Vitamin (MULTIVITAMIN WITH MINERALS) TABS tablet   Oral   Take 4 tablets by mouth daily. "One a day Women's with energy"         . Norethin Ace-Eth Estrad-FE (MINASTRIN 24 FE PO)   Oral   Take 1 tablet by mouth daily.         . Olopatadine HCl (PATADAY) 0.2 % SOLN   Both Eyes   Place 1 drop into both eyes daily.         . promethazine (PHENERGAN) 25 MG tablet   Oral   Take 25 mg by mouth every 6 (six) hours as needed for nausea (neausea).         . nitroGLYCERIN (NITROSTAT) 0.4 MG SL tablet    Sublingual   Place 1 tablet (0.4 mg total) under the tongue every 5 (five) minutes x 3 doses as needed for chest pain.   25 tablet   1    BP 146/86  Pulse 91  Temp(Src) 98.3 F (36.8 C) (Oral)  Resp 20  Ht 4\' 11"  (1.499 m)  Wt 162 lb (73.483 kg)  BMI 32.7 kg/m2  SpO2 100%  LMP 01/12/2013 Physical Exam  Nursing note and vitals reviewed. Constitutional: She appears well-developed and well-nourished. No distress.  HENT:  Mouth/Throat: Oropharynx is clear and moist.  Eyes: Conjunctivae are normal. No scleral icterus.  Neck: Neck supple. No JVD present. No tracheal deviation present.  Cardiovascular: Normal rate, regular rhythm and intact distal pulses.   Murmur heard. Pulmonary/Chest: Effort normal and breath sounds normal. No respiratory distress.  Abdominal: Soft. Normal appearance. She exhibits no distension. There is no tenderness.  Musculoskeletal: She exhibits no edema and no tenderness.  Neurological: She is alert.  Skin: Skin is warm and dry. No rash noted.  Psychiatric: She has a normal mood and affect.    ED Course   Procedures (including critical care time)  Results for orders placed during the hospital encounter of 02/04/13  CBC      Result Value Range   WBC 6.5  4.0 - 10.5 K/uL   RBC 4.75  3.87 - 5.11 MIL/uL   Hemoglobin 14.9  12.0 - 15.0 g/dL   HCT 16.1  09.6 - 04.5 %   MCV 86.7  78.0 - 100.0 fL   MCH 31.4  26.0 - 34.0 pg   MCHC 36.2 (*) 30.0 - 36.0 g/dL   RDW 40.9  81.1 - 91.4 %   Platelets 222  150 - 400 K/uL  BASIC METABOLIC PANEL      Result Value Range   Sodium 132 (*) 135 - 145 mEq/L   Potassium 4.1  3.5 - 5.1 mEq/L   Chloride 99  96 - 112 mEq/L   CO2 25  19 - 32 mEq/L   Glucose, Bld 169 (*) 70 - 99 mg/dL   BUN 11  6 - 23 mg/dL   Creatinine, Ser 7.82  0.50 - 1.10 mg/dL   Calcium 9.9  8.4 - 95.6 mg/dL   GFR calc non Af Amer >90  >90 mL/min   GFR calc Af Amer >90  >90 mL/min  PRO B NATRIURETIC PEPTIDE      Result Value Range   Pro B  Natriuretic peptide (BNP) 37.2  0 - 125 pg/mL  POCT I-STAT TROPONIN I      Result Value Range   Troponin i, poc 0.02  0.00 - 0.08 ng/mL   Comment 3            Dg Chest 2 View  02/04/2013   *RADIOLOGY REPORT*  Clinical Data: Shortness of breath  CHEST - 2 VIEW  Comparison: 12/08/2012; 06/04/2012  Findings:  Grossly unchanged enlarged cardiac silhouette and mediastinal contours.  There is chronic pulmonary venous congestion without frank evidence of edema.  Minimal bibasilar linear heterogeneous opacities, left greater than right, likely atelectasis. No focal airspace opacities. No pleural effusion or pneumothorax.  Unchanged bones.  IMPRESSION: Unchanged findings of cardiomegaly and pulmonary venous congestion without definite acute cardiopulmonary disease.   Original Report Authenticated By: Tacey Ruiz, MD     MDM  Labs. Cxr.  Reviewed nursing notes and prior charts for additional history.    Date: 02/04/2013  Rate: 86  Rhythm: normal sinus rhythm  QRS Axis: normal  Intervals: normal  ST/T Wave abnormalities: nonspecific ST changes  Conduction Disutrbances:none  Narrative Interpretation:   Old EKG Reviewed: unchanged  Prior echo result noted below (from 6/14): Study Conclusions  - Left ventricle: The cavity size was mildly dilated. There was mild concentric hypertrophy. Systolic function was normal. The estimated ejection fraction was in the range of 60% to 65%. Wall motion was normal; there were no regional wall motion abnormalities. Doppler parameters are consistent with abnormal left ventricular relaxation (grade 1 diastolic dysfunction). - Ventricular septum: There was a congenital defect in the perimembranous region. - Aortic valve: Trivial regurgitation. - Tricuspid valve: Severe regurgitation. - Pulmonary arteries: Systolic pressure was severely increased.  Pt currently having no cp or discomfort, breathing comfortably. rr 16, pulse ox 99% room air. cxr unchanged  from prior. After symptoms present for past day, trop neg.  bnp not elevated.   Pt appears stable for d/c.  Will have f/u w her doctor/cardiologist tomorrow for recheck.     Suzi Roots, MD 02/04/13 2154

## 2013-02-06 ENCOUNTER — Encounter: Payer: Medicaid Other | Attending: Cardiology | Admitting: Dietician

## 2013-02-06 ENCOUNTER — Encounter: Payer: Self-pay | Admitting: Dietician

## 2013-02-06 VITALS — Ht 59.0 in | Wt 169.9 lb

## 2013-02-06 DIAGNOSIS — E669 Obesity, unspecified: Secondary | ICD-10-CM | POA: Insufficient documentation

## 2013-02-06 DIAGNOSIS — Z713 Dietary counseling and surveillance: Secondary | ICD-10-CM | POA: Insufficient documentation

## 2013-02-06 NOTE — ED Provider Notes (Signed)
Medical screening examination/treatment/procedure(s) were performed by non-physician practitioner and as supervising physician I was immediately available for consultation/collaboration.  Dorise Gangi, MD 02/06/13 1217 

## 2013-02-06 NOTE — Patient Instructions (Addendum)
Take short 10 minute walks, working your way up to 30 minutes 5 x week if possible. Eat small meals or snacks every 3-5 hours to fuel you metabolism. Have snacks with protein and carbohydrates.  If you are not hungry, try protein shakes instead of meals or snacks.

## 2013-02-06 NOTE — Progress Notes (Signed)
  Medical Nutrition Therapy:  Appt start time: 1145 end time:  1230.   Assessment:  Primary concerns today: Doctor recommended that she visit today because of her weight. States that she would like her weight to be 120 lbs again, which was her weight around age 42. States that her weight went up over the years mostly d/t stress.   Currently does not have a job and lives with her three kids (ages 12 and 20) and states that she does the grocery shopping and food preparation. Has a congential heart valve defect and was discharged from the hospital yesterday after feeling like she couldn't breathe.  Previously worked with Vernona Rieger last year. Stated they worked on eating breakfast, looking at labels, and getting more exercise and she was able to make some changes but got off track last year. Currently reports skipping breakfast and lunch most days and never feels hungry.   States doctors says it's ok to exercise as long as she goes at her own pace. Says she is constantly on the go taking kids to job interviews or jobs and going to doctors appointments so it is hard to make time in her day to exercise.   MEDICATIONS: see list   DIETARY INTAKE:  24-hr recall:  B ( AM): skips most days, bowl of cereal rarely Snk ( AM): none usually  L ( PM): skips most of the time, [employer] Happy Meal Snk ( PM): fruit D ( PM): sandwich with banana, spaghetti  Snk ( PM): fruit Beverages: water and juice (one)   Usual physical activity: none  Estimated energy needs: 1400 calories 158 g carbohydrates 105 g protein 39 g fat  Progress Towards Goal(s):  In progress.   Nutritional Diagnosis:  NB-1.1 Food and nutrition-related knowledge deficit As related to meal skipping, lack of physical activity, frequent fast food meals, and unstructured meals  As evidenced by HTN and BMI of 34.3    Intervention:  Nutrition counseling provided. Discussed the importance of eating meals in order to reduce stress in her body and  help facilitate weight loss. Suggested having snacks with protein and CHO in her car or in her purse if she is on-the-go and doesn't have time to plan a structured meal. Suggested drinking a protein shake if that is easier than preparing a meal. Recommended adding in walks whenever she can and trying to recruit kids to walk with her or going on her treadmill.   Plan: Take short 10 minute walks, working your way up to 30 minutes 5 x week if possible. Eat small meals or snacks every 3-5 hours to fuel your metabolism. Have snacks with protein and carbohydrates.  If you are not hungry, try protein shakes instead of meals or snacks.  Handouts given during visit include:  15g CHO handout  1200 calorie 5 day meal plan  Monitoring/Evaluation:  Dietary intake, exercise, and body weight in 6 week(s).

## 2013-02-21 ENCOUNTER — Emergency Department (HOSPITAL_COMMUNITY)
Admission: EM | Admit: 2013-02-21 | Discharge: 2013-02-22 | Disposition: A | Discharge status: Home / Self Care | Payer: Medicaid Other | Attending: Emergency Medicine | Admitting: Emergency Medicine

## 2013-02-21 ENCOUNTER — Encounter (HOSPITAL_COMMUNITY): Payer: Self-pay

## 2013-02-21 DIAGNOSIS — R011 Cardiac murmur, unspecified: Secondary | ICD-10-CM | POA: Insufficient documentation

## 2013-02-21 DIAGNOSIS — E669 Obesity, unspecified: Secondary | ICD-10-CM | POA: Insufficient documentation

## 2013-02-21 DIAGNOSIS — R7309 Other abnormal glucose: Secondary | ICD-10-CM | POA: Insufficient documentation

## 2013-02-21 DIAGNOSIS — Z87798 Personal history of other (corrected) congenital malformations: Secondary | ICD-10-CM | POA: Insufficient documentation

## 2013-02-21 DIAGNOSIS — J45909 Unspecified asthma, uncomplicated: Secondary | ICD-10-CM | POA: Insufficient documentation

## 2013-02-21 DIAGNOSIS — Z91040 Latex allergy status: Secondary | ICD-10-CM | POA: Insufficient documentation

## 2013-02-21 DIAGNOSIS — Z79899 Other long term (current) drug therapy: Secondary | ICD-10-CM | POA: Insufficient documentation

## 2013-02-21 DIAGNOSIS — Z880 Allergy status to penicillin: Secondary | ICD-10-CM | POA: Insufficient documentation

## 2013-02-21 DIAGNOSIS — IMO0001 Reserved for inherently not codable concepts without codable children: Secondary | ICD-10-CM | POA: Insufficient documentation

## 2013-02-21 DIAGNOSIS — M7918 Myalgia, other site: Secondary | ICD-10-CM

## 2013-02-21 MED ORDER — KETOROLAC TROMETHAMINE 60 MG/2ML IM SOLN
60.0000 mg | Freq: Once | INTRAMUSCULAR | Status: AC
  Administered 2013-02-21: 60 mg via INTRAMUSCULAR
  Filled 2013-02-21: qty 2

## 2013-02-21 NOTE — ED Notes (Signed)
Pt complains of left side and left upper abdominal pain since yesterday

## 2013-02-21 NOTE — ED Provider Notes (Signed)
CSN: [id number]     Arrival date & time 02/21/13  1954 History   First MD Initiated Contact with Patient 02/21/13 2102     Chief Complaint  Patient presents with  . Abdominal Pain   (Consider location/radiation/quality/duration/timing/severity/associated sxs/prior Treatment) HPI Comments: Patient with no significant PMH -- previous visit for same 3 weeks ago -- presents with c/o L lateral inferior rib pain. Worsened yesterday after twisting to take out garbage. Took flexeril and vicodin PTA without relief. No PE risk factors. No cough, SOB. No other injuries. The onset of this condition was acute. The course is constant. Aggravating factors: movement. Alleviating factors: none.    The history is provided by the patient and medical records.    Past Medical History  Diagnosis Date  . Obesity   . Heart murmur   . Borderline diabetes mellitus   . Congenital ventricular septal defect   . Asthma    Past Surgical History  Procedure Laterality Date  . Tubal ligation    . Cyst excision     Family History  Problem Relation Age of Onset  . Cancer Other   . Hyperlipidemia Other   . Hypertension Other   . Asthma Other    History  Substance Use Topics  . Smoking status: Never Smoker   . Smokeless tobacco: Never Used  . Alcohol Use: No   OB History   Grav Para Term Preterm Abortions TAB SAB Ect Mult Living                 Review of Systems  Constitutional: Negative for fever.  HENT: Negative for sore throat and rhinorrhea.   Eyes: Negative for redness.  Respiratory: Negative for cough.   Cardiovascular: Negative for chest pain and leg swelling.  Gastrointestinal: Negative for nausea, vomiting, abdominal pain and diarrhea.  Genitourinary: Negative for dysuria.  Musculoskeletal: Positive for myalgias.  Skin: Negative for rash.  Neurological: Negative for headaches.    Allergies  Penicillins and Latex  Home Medications   Current Outpatient Rx  Name  Route  Sig  Dispense   Refill  . albuterol (PROVENTIL HFA;VENTOLIN HFA) 108 (90 BASE) MCG/ACT inhaler   Inhalation   Inhale 2 puffs into the lungs every 6 (six) hours as needed. Wheezing and shortness of breath         . albuterol (PROVENTIL) (2.5 MG/3ML) 0.083% nebulizer solution   Nebulization   Take 2.5 mg by nebulization every 6 (six) hours as needed. For shortness of breath         . amLODipine (NORVASC) 10 MG tablet   Oral   Take 5 mg by mouth daily. Take a half tablet daily         . Calcium-Phosphorus-Vitamin D (CALCIUM GUMMIES) 250-100-500 MG-MG-UNIT CHEW   Oral   Chew 2 tablets by mouth daily.         . carvedilol (COREG) 3.125 MG tablet   Oral   Take 1 tablet (3.125 mg total) by mouth 2 (two) times daily with a meal.   60 tablet   1   . cyclobenzaprine (FLEXERIL) 10 MG tablet   Oral   Take 0.5 tablets (5 mg total) by mouth 2 (two) times daily as needed for muscle spasms.   20 tablet   0   . HYDROcodone-acetaminophen (NORCO/VICODIN) 5-325 MG per tablet   Oral   Take 1 tablet by mouth every 4 (four) hours as needed for pain.   20 tablet   0   .  Multiple Vitamin (MULTIVITAMIN WITH MINERALS) TABS tablet   Oral   Take 4 tablets by mouth daily. "One a day Women's with energy"         . nitroGLYCERIN (NITROSTAT) 0.4 MG SL tablet   Sublingual   Place 1 tablet (0.4 mg total) under the tongue every 5 (five) minutes x 3 doses as needed for chest pain.   25 tablet   1   . Norethin Ace-Eth Estrad-FE (MINASTRIN 24 FE PO)   Oral   Take 1 tablet by mouth daily.         . Olopatadine HCl (PATADAY) 0.2 % SOLN   Both Eyes   Place 1 drop into both eyes daily.         . promethazine (PHENERGAN) 25 MG tablet   Oral   Take 25 mg by mouth every 6 (six) hours as needed for nausea (neausea).          BP 114/84  Pulse 97  Temp(Src) 99 F (37.2 C) (Oral)  Resp 20  SpO2 100%  LMP 02/07/2013 Physical Exam  Nursing note and vitals reviewed. Constitutional: She appears  well-developed and well-nourished.  HENT:  Head: Normocephalic and atraumatic.  Eyes: Conjunctivae are normal.  Neck: Normal range of motion. Neck supple.  Pulmonary/Chest: No respiratory distress. She exhibits tenderness.  Tenderness over inferior anterior and lateral L ribs.   Neurological: She is alert.  Skin: Skin is warm and dry.  Psychiatric: She has a normal mood and affect.    ED Course  Procedures (including critical care time) Labs Review Labs Reviewed - No data to display Imaging Review No results found.  Patient seen and examined. Work-up initiated. Medications ordered.   Vital signs reviewed and are as follows: Filed Vitals:   02/22/13 0008  BP: 131/69  Pulse: 94  Temp:   Resp: 20   Pt given toradol. She is feeling better.   Will switch pain medication and muscle relaxer.   Patient counseled on use of narcotic pain medications. Counseled not to combine these medications with others containing tylenol. Urged not to drink alcohol, drive, or perform any other activities that requires focus while taking these medications. The patient verbalizes understanding and agrees with the plan.   MDM   1. Musculoskeletal pain    Patient with pain consistent with musculoskeletal pain. Worse with movement and palpation. H/o recent twisting injury. PERC neg. Do not suspect PE. Conservative management indicated.     Renne Crigler, PA-C 02/23/13 [phone]

## 2013-02-22 MED ORDER — METHOCARBAMOL 500 MG PO TABS: 1000.0000 mg | ORAL_TABLET | Freq: Four times a day (QID) | ORAL | Status: DC

## 2013-02-22 MED ORDER — TRAMADOL HCL 50 MG PO TABS: 50.0000 mg | ORAL_TABLET | Freq: Four times a day (QID) | ORAL | Status: DC | PRN

## 2013-02-22 MED ORDER — IBUPROFEN 600 MG PO TABS: 600.0000 mg | ORAL_TABLET | Freq: Four times a day (QID) | ORAL | Status: DC | PRN

## 2013-02-22 NOTE — ED Notes (Signed)
Pt reports feeling well enough to be discharged.  Josh, PA made aware.

## 2013-02-25 ENCOUNTER — Emergency Department (HOSPITAL_COMMUNITY)
Admission: EM | Admit: 2013-02-25 | Discharge: 2013-02-25 | Disposition: A | Discharge status: Home / Self Care | Payer: Medicaid Other | Attending: Emergency Medicine | Admitting: Emergency Medicine

## 2013-02-25 ENCOUNTER — Encounter (HOSPITAL_COMMUNITY): Payer: Self-pay | Admitting: Emergency Medicine

## 2013-02-25 DIAGNOSIS — Z79899 Other long term (current) drug therapy: Secondary | ICD-10-CM | POA: Insufficient documentation

## 2013-02-25 DIAGNOSIS — Z3202 Encounter for pregnancy test, result negative: Secondary | ICD-10-CM | POA: Insufficient documentation

## 2013-02-25 DIAGNOSIS — Z8774 Personal history of (corrected) congenital malformations of heart and circulatory system: Secondary | ICD-10-CM | POA: Insufficient documentation

## 2013-02-25 DIAGNOSIS — J45909 Unspecified asthma, uncomplicated: Secondary | ICD-10-CM | POA: Insufficient documentation

## 2013-02-25 DIAGNOSIS — Z880 Allergy status to penicillin: Secondary | ICD-10-CM | POA: Insufficient documentation

## 2013-02-25 DIAGNOSIS — Z91040 Latex allergy status: Secondary | ICD-10-CM | POA: Insufficient documentation

## 2013-02-25 DIAGNOSIS — R011 Cardiac murmur, unspecified: Secondary | ICD-10-CM | POA: Insufficient documentation

## 2013-02-25 DIAGNOSIS — N390 Urinary tract infection, site not specified: Secondary | ICD-10-CM | POA: Insufficient documentation

## 2013-02-25 DIAGNOSIS — E669 Obesity, unspecified: Secondary | ICD-10-CM | POA: Insufficient documentation

## 2013-02-25 DIAGNOSIS — Z792 Long term (current) use of antibiotics: Secondary | ICD-10-CM | POA: Insufficient documentation

## 2013-02-25 LAB — COMPREHENSIVE METABOLIC PANEL
ALT: 28 U/L (ref 0–35)
Alkaline Phosphatase: 44 U/L (ref 39–117)
BUN: 8 mg/dL (ref 6–23)
CO2: 24 mEq/L (ref 19–32)
Calcium: 9 mg/dL (ref 8.4–10.5)
GFR calc Af Amer: >90 mL/min (ref >90)
GFR calc non Af Amer: 88 mL/min — ABNORMAL LOW (ref >90)
Glucose, Bld: 137 mg/dL — ABNORMAL HIGH (ref 70–99)
Sodium: 135 mEq/L (ref 135–145)

## 2013-02-25 LAB — CBC WITH DIFFERENTIAL/PLATELET
Eosinophils Relative: 2 % (ref 0–5)
HCT: 37.7 % (ref 36.0–46.0)
Hemoglobin: 13.5 g/dL (ref 12.0–15.0)
Lymphocytes Relative: 29 % (ref 12–46)
Lymphs Abs: 1.4 10*3/uL (ref 0.7–4.0)
MCV: 86.3 fL (ref 78.0–100.0)
Monocytes Relative: 12 % (ref 3–12)
Platelets: 221 10*3/uL (ref 150–400)
RBC: 4.37 MIL/uL (ref 3.87–5.11)
WBC: 4.9 10*3/uL (ref 4.0–10.5)

## 2013-02-25 LAB — URINALYSIS, ROUTINE W REFLEX MICROSCOPIC
Bilirubin Urine: NEGATIVE
Glucose, UA: NEGATIVE mg/dL
Ketones, ur: NEGATIVE mg/dL
Nitrite: NEGATIVE
Specific Gravity, Urine: 1.019 (ref 1.005–1.030)
pH: 5.5 (ref 5.0–8.0)

## 2013-02-25 LAB — URINE MICROSCOPIC-ADD ON

## 2013-02-25 MED ORDER — CIPROFLOXACIN HCL 500 MG PO TABS: 500.0000 mg | ORAL_TABLET | Freq: Two times a day (BID) | ORAL | Status: DC

## 2013-02-25 MED ORDER — IBUPROFEN 200 MG PO TABS
600.0000 mg | ORAL_TABLET | Freq: Once | ORAL | Status: AC
  Administered 2013-02-25: 600 mg via ORAL
  Filled 2013-02-25: qty 3

## 2013-02-25 NOTE — ED Provider Notes (Signed)
CSN: [id number]     Arrival date & time 02/25/13  1754 History   First MD Initiated Contact with Patient 02/25/13 1832     Chief Complaint  Patient presents with  . Seizures   (Consider location/radiation/quality/duration/timing/severity/associated sxs/prior Treatment) HPI  Mackenzie Patterson is a 42 y.o.female with a significant PMH of heart murmur, borderline diabetes, asthma and a nonspecific seizure disorder presents to the ER with complaints of having a "few seizures today" that were witnessed by her aunt and fiance. The patient has had multiple episodes of these which the family says happens whenever she gets stressed, tired, or really hungry. Currently she is stressed out and didn't eat today. She and the family say that she was watching TV when her eyes started to flutter. No tonic clonic motion and no auditory noises. She then had one more episode of the eye fluttering when EMS arrived without any post ictal phase. She says that she has been to the ED multiple times for these 'seizures' but has never been diagnosed and hasn't followed up with the neurologist as instructed. I reviewed her previous brain MRI and head CT from previous visits which were unremarkable. No previous diagnosis of seizure was found in reviewing her charts and she is not on any seizure medication. Pt alert and oriented x 3  Past Medical History  Diagnosis Date  . Obesity   . Heart murmur   . Borderline diabetes mellitus   . Congenital ventricular septal defect   . Asthma    Past Surgical History  Procedure Laterality Date  . Tubal ligation    . Cyst excision     Family History  Problem Relation Age of Onset  . Cancer Other   . Hyperlipidemia Other   . Hypertension Other   . Asthma Other    History  Substance Use Topics  . Smoking status: Never Smoker   . Smokeless tobacco: Never Used  . Alcohol Use: No   OB History   Grav Para Term Preterm Abortions TAB SAB Ect Mult Living                 Review  of Systems ROS is negative unless otherwise stated in the HPI  Allergies  Penicillins and Latex  Home Medications   Current Outpatient Rx  Name  Route  Sig  Dispense  Refill  . albuterol (PROVENTIL HFA;VENTOLIN HFA) 108 (90 BASE) MCG/ACT inhaler   Inhalation   Inhale 2 puffs into the lungs every 6 (six) hours as needed for wheezing or shortness of breath.          Marland Kitchen albuterol (PROVENTIL) (2.5 MG/3ML) 0.083% nebulizer solution   Nebulization   Take 2.5 mg by nebulization every 6 (six) hours as needed for wheezing or shortness of breath.          Marland Kitchen amLODipine (NORVASC) 10 MG tablet   Oral   Take 5 mg by mouth daily.          . Calcium-Phosphorus-Vitamin D (CALCIUM GUMMIES) 250-100-500 MG-MG-UNIT CHEW   Oral   Chew 2 tablets by mouth daily.         . carvedilol (COREG) 3.125 MG tablet   Oral   Take 1 tablet (3.125 mg total) by mouth 2 (two) times daily with a meal.   60 tablet   1   . cyclobenzaprine (FLEXERIL) 10 MG tablet   Oral   Take 0.5 tablets (5 mg total) by mouth 2 (two) times daily as needed  for muscle spasms.   20 tablet   0   . HYDROcodone-acetaminophen (NORCO/VICODIN) 5-325 MG per tablet   Oral   Take 1 tablet by mouth every 4 (four) hours as needed for pain.   20 tablet   0   . Multiple Vitamin (MULTIVITAMIN WITH MINERALS) TABS tablet   Oral   Take 4 tablets by mouth daily. "One a day Women's with energy"         . nitroGLYCERIN (NITROSTAT) 0.4 MG SL tablet   Sublingual   Place 1 tablet (0.4 mg total) under the tongue every 5 (five) minutes x 3 doses as needed for chest pain.   25 tablet   1   . Norethin Ace-Eth Estrad-FE (MINASTRIN 24 FE PO)   Oral   Take 1 tablet by mouth daily.         . Olopatadine HCl (PATADAY) 0.2 % SOLN   Both Eyes   Place 1 drop into both eyes daily.         . promethazine (PHENERGAN) 25 MG tablet   Oral   Take 25 mg by mouth every 6 (six) hours as needed for nausea.          . ciprofloxacin (CIPRO)  500 MG tablet   Oral   Take 1 tablet (500 mg total) by mouth 2 (two) times daily.   20 tablet   0   . traMADol (ULTRAM) 50 MG tablet   Oral   Take 1 tablet (50 mg total) by mouth every 6 (six) hours as needed for pain.   15 tablet   0    BP 138/76  Pulse 99  Temp(Src) 97.9 F (36.6 C) (Oral)  Resp 20  SpO2 100%  LMP 02/07/2013 Physical Exam  Nursing note and vitals reviewed. Constitutional: She is oriented to person, place, and time. She appears well-developed and well-nourished. No distress.  HENT:  Head: Normocephalic and atraumatic.  Eyes: Pupils are equal, round, and reactive to light.  Neck: Normal range of motion. Neck supple.  Cardiovascular: Normal rate and regular rhythm.   Pulmonary/Chest: Effort normal.  Abdominal: Soft.  Neurological: She is alert and oriented to person, place, and time. She has normal strength. No cranial nerve deficit or sensory deficit. She displays a negative Romberg sign.  Skin: Skin is warm and dry.    ED Course  Procedures (including critical care time) Labs Review Labs Reviewed  URINALYSIS, ROUTINE W REFLEX MICROSCOPIC - Abnormal; Notable for the following:    APPearance CLOUDY (*)    Hgb urine dipstick MODERATE (*)    Urobilinogen, UA 2.0 (*)    Leukocytes, UA LARGE (*)    All other components within normal limits  COMPREHENSIVE METABOLIC PANEL - Abnormal; Notable for the following:    Glucose, Bld 137 (*)    Albumin 3.3 (*)    AST 57 (*)    GFR calc non Af Amer 88 (*)    All other components within normal limits  URINE MICROSCOPIC-ADD ON - Abnormal; Notable for the following:    Bacteria, UA MANY (*)    All other components within normal limits  URINE CULTURE  PREGNANCY, URINE  CBC WITH DIFFERENTIAL   Imaging Review No results found.  MDM   1. Urinary tract infection     Date: 02/25/2013  Rate: 86  Rhythm:  sinus rhythm  QRS Axis: normal  Intervals: normal  ST/T Wave abnormalities: normal  Conduction  Disutrbances:none  Narrative Interpretation:   Old EKG  Reviewed: unchanged   Pt has large UTI, will send out culture and treat with Cipro. Patient to f/u with PCP and given referral to neurology. No episodes of eye fluttering while in the ED today. Pt continues to be A and O x 3.  42 y.o.Janan Bogie Bendix's evaluation in the Emergency Department is complete. It has been determined that no acute conditions requiring further emergency intervention are present at this time. The patient/guardian have been advised of the diagnosis and plan. We have discussed signs and symptoms that warrant return to the ED, such as changes or worsening in symptoms.  Vital signs are stable at discharge. Filed Vitals:   02/25/13 1825  BP: 138/76  Pulse: 99  Temp: 97.9 F (36.6 C)  Resp: 20    Patient/guardian has voiced understanding and agreed to follow-up with the PCP or specialist.      Dorthula Matas, PA-C 02/25/13 2051

## 2013-02-25 NOTE — ED Provider Notes (Signed)
Medical screening examination/treatment/procedure(s) were performed by non-physician practitioner and as supervising physician I was immediately available for consultation/collaboration.   David H Yao, MD 02/25/13 2305 

## 2013-02-25 NOTE — ED Provider Notes (Signed)
Medical screening examination/treatment/procedure(s) were performed by non-physician practitioner and as supervising physician I was immediately available for consultation/collaboration.    Domenique Quest J. Charl Wellen, MD 02/25/13 0730 

## 2013-02-28 LAB — URINE CULTURE: Colony Count: >=100000

## 2013-03-20 ENCOUNTER — Ambulatory Visit: Payer: Medicaid Other | Admitting: Dietician

## 2013-04-08 ENCOUNTER — Encounter (HOSPITAL_COMMUNITY): Payer: Self-pay | Admitting: Emergency Medicine

## 2013-04-08 ENCOUNTER — Emergency Department (HOSPITAL_COMMUNITY)
Admission: EM | Admit: 2013-04-08 | Discharge: 2013-04-08 | Disposition: A | Discharge status: Home / Self Care | Payer: Medicaid Other | Attending: Emergency Medicine | Admitting: Emergency Medicine

## 2013-04-08 DIAGNOSIS — J45909 Unspecified asthma, uncomplicated: Secondary | ICD-10-CM | POA: Insufficient documentation

## 2013-04-08 DIAGNOSIS — S8990XA Unspecified injury of unspecified lower leg, initial encounter: Secondary | ICD-10-CM | POA: Insufficient documentation

## 2013-04-08 DIAGNOSIS — Z792 Long term (current) use of antibiotics: Secondary | ICD-10-CM | POA: Insufficient documentation

## 2013-04-08 DIAGNOSIS — Z79899 Other long term (current) drug therapy: Secondary | ICD-10-CM | POA: Insufficient documentation

## 2013-04-08 DIAGNOSIS — Z8774 Personal history of (corrected) congenital malformations of heart and circulatory system: Secondary | ICD-10-CM | POA: Insufficient documentation

## 2013-04-08 DIAGNOSIS — M25562 Pain in left knee: Secondary | ICD-10-CM

## 2013-04-08 DIAGNOSIS — Z880 Allergy status to penicillin: Secondary | ICD-10-CM | POA: Insufficient documentation

## 2013-04-08 DIAGNOSIS — Z8679 Personal history of other diseases of the circulatory system: Secondary | ICD-10-CM | POA: Insufficient documentation

## 2013-04-08 DIAGNOSIS — Y929 Unspecified place or not applicable: Secondary | ICD-10-CM | POA: Insufficient documentation

## 2013-04-08 DIAGNOSIS — Z91040 Latex allergy status: Secondary | ICD-10-CM | POA: Insufficient documentation

## 2013-04-08 DIAGNOSIS — R011 Cardiac murmur, unspecified: Secondary | ICD-10-CM | POA: Insufficient documentation

## 2013-04-08 DIAGNOSIS — X500XXA Overexertion from strenuous movement or load, initial encounter: Secondary | ICD-10-CM | POA: Insufficient documentation

## 2013-04-08 DIAGNOSIS — Y9301 Activity, walking, marching and hiking: Secondary | ICD-10-CM | POA: Insufficient documentation

## 2013-04-08 DIAGNOSIS — E669 Obesity, unspecified: Secondary | ICD-10-CM | POA: Insufficient documentation

## 2013-04-08 MED ORDER — CYCLOBENZAPRINE HCL 10 MG PO TABS: 5.0000 mg | ORAL_TABLET | Freq: Two times a day (BID) | ORAL | Status: DC | PRN

## 2013-04-08 MED ORDER — NAPROXEN 500 MG PO TABS: 500.0000 mg | ORAL_TABLET | Freq: Two times a day (BID) | ORAL | Status: DC

## 2013-04-08 NOTE — ED Notes (Signed)
Patient states that she has had left knee pain and swelling x 1 day. Denies injury; states pain with weight bearing.

## 2013-04-08 NOTE — ED Provider Notes (Signed)
CSN: [id number]     Arrival date & time 04/08/13  1840 History   First MD Initiated Contact with Patient 04/08/13 1849     This chart was scribed for Majel Homer, by Ladona Ridgel Day, ED scribe. This patient was seen in room WTR8/WTR8 and the patient's care was started at 1840.  Chief Complaint  Patient presents with  . Knee Pain   The history is provided by the patient. No language interpreter was used.   HPI Comments: Mackenzie Patterson is a 42 y.o. female who presents to the Emergency Department complaining of chronic left knee pain for 5 months that worsened today while she was walking and heard a popping noise from her left knee with sharp pain which she localizes to the posterior aspect of her knee, pain does not radiate. She states pain is worse w/walking and hurts to both extend/flex her knee, she also states crepitus when she palpates her knee. She denies any other pain/injuries. She denies numbness of her affected knee/leg but states that her knee sometimes gives out. She states that her chronic knee pain began 5 months ago w/no specific injury and suddenly worsened today while she was walking. She has previous workups of her knee and last saw orthopedist 3 weeks ago, along w/X-ray of her knee and physical therapy which she states did not help her knee pain. She states using ice/heat to her knee w/out improvement in pain.  She states a hx of back pain.   Past Medical History  Diagnosis Date  . Obesity   . Heart murmur   . Borderline diabetes mellitus   . Congenital ventricular septal defect   . Asthma    Past Surgical History  Procedure Laterality Date  . Tubal ligation    . Cyst excision     Family History  Problem Relation Age of Onset  . Cancer Other   . Hyperlipidemia Other   . Hypertension Other   . Asthma Other    History  Substance Use Topics  . Smoking status: Never Smoker   . Smokeless tobacco: Never Used  . Alcohol Use: No   OB History   Grav Para Term Preterm  Abortions TAB SAB Ect Mult Living                 Review of Systems  Musculoskeletal:       Left knee pain  Neurological: Negative for numbness.  A complete 10 system review of systems was obtained and all systems are negative except as noted in the HPI and PMH.    Allergies  Penicillins and Latex  Home Medications   Current Outpatient Rx  Name  Route  Sig  Dispense  Refill  . albuterol (PROVENTIL HFA;VENTOLIN HFA) 108 (90 BASE) MCG/ACT inhaler   Inhalation   Inhale 2 puffs into the lungs every 6 (six) hours as needed for wheezing or shortness of breath.          Marland Kitchen albuterol (PROVENTIL) (2.5 MG/3ML) 0.083% nebulizer solution   Nebulization   Take 2.5 mg by nebulization every 6 (six) hours as needed for wheezing or shortness of breath.          Marland Kitchen amLODipine (NORVASC) 10 MG tablet   Oral   Take 5 mg by mouth daily.          . Calcium-Phosphorus-Vitamin D (CALCIUM GUMMIES) 250-100-500 MG-MG-UNIT CHEW   Oral   Chew 2 tablets by mouth daily.         Marland Kitchen  ciprofloxacin (CIPRO) 500 MG tablet   Oral   Take 1 tablet (500 mg total) by mouth 2 (two) times daily.   20 tablet   0   . Multiple Vitamin (MULTIVITAMIN WITH MINERALS) TABS tablet   Oral   Take 4 tablets by mouth daily. "One a day Women's with energy"         . nitroGLYCERIN (NITROSTAT) 0.4 MG SL tablet   Sublingual   Place 1 tablet (0.4 mg total) under the tongue every 5 (five) minutes x 3 doses as needed for chest pain.   25 tablet   1   . norethindrone-ethinyl estradiol (JUNEL FE,GILDESS FE,LOESTRIN FE) 1-20 MG-MCG tablet   Oral   Take 1 tablet by mouth daily.         . Olopatadine HCl (PATADAY) 0.2 % SOLN   Both Eyes   Place 1 drop into both eyes daily.         . promethazine (PHENERGAN) 25 MG tablet   Oral   Take 25 mg by mouth every 6 (six) hours as needed for nausea.          . cyclobenzaprine (FLEXERIL) 10 MG tablet   Oral   Take 0.5 tablets (5 mg total) by mouth 2 (two) times daily as  needed for muscle spasms.   20 tablet   0   . naproxen (NAPROSYN) 500 MG tablet   Oral   Take 1 tablet (500 mg total) by mouth 2 (two) times daily.   20 tablet   0   . valACYclovir (VALTREX) 500 MG tablet   Oral   Take 500 mg by mouth daily.          There were no vitals taken for this visit. Physical Exam  Nursing note and vitals reviewed. Constitutional: She is oriented to person, place, and time. She appears well-developed and well-nourished. No distress.  HENT:  Head: Normocephalic and atraumatic.  Neck: Neck supple. No tracheal deviation present.  Cardiovascular: Normal rate.   Pulmonary/Chest: Effort normal. No respiratory distress.  Musculoskeletal: Normal range of motion. She exhibits tenderness.       Left hip: Normal.       Left ankle: Normal.  Left knee moderately tender to anterior aspects of her knee w/crepitus on palpation, no signficant knee effusion, no obvious deformity. Negative anterior/posterior drawer test. Tender to varus/valgus maneuver, w/out obviouss joint laxity of her left knee. Questionable bakers cyst noted to posterior fossa of her left knee. No rash    Neurological: She is alert and oriented to person, place, and time.  Skin: Skin is warm and dry.  Psychiatric: She has a normal mood and affect. Her behavior is normal.    ED Course  Procedures (including critical care time) DIAGNOSTIC STUDIES: Oxygen Saturation is 99% on room air, normal by my interpretation.    COORDINATION OF CARE: At 700 PM Discussed treatment plan with patient which includes pain medicine, and recommend close f/u with ortho as pt fail outpt treatment both with OTC meds, and PT.  She may benefit from further imaging or possible arthroscopic intervention to ameliorate her sxs. Patient agrees. Recommend RICE.  Pt has knee brace at home which i encourage usage as needed. Currently doubt GOUT or Septic arthritis.  No recent trauma to warranted xray at this time.    Labs  Review Labs Reviewed - No data to display Imaging Review No results found.  EKG Interpretation   None       MDM  1. Pain in joint of left knee     BP 143/73  Pulse 100  Temp(Src) 98.7 F (37.1 C) (Oral)  Resp 18  Ht 4\' 11"  (1.499 m)  Wt 169 lb (76.658 kg)  BMI 34.12 kg/m2  SpO2 100%  LMP 04/07/2013  I personally performed the services described in this documentation, which was scribed in my presence. The recorded information has been reviewed and is accurate.       Fayrene Helper, PA-C 04/08/13 1925

## 2013-04-11 NOTE — ED Provider Notes (Signed)
Medical screening examination/treatment/procedure(s) were performed by non-physician practitioner and as supervising physician I was immediately available for consultation/collaboration.   Kenson Groh J Yamilka Lopiccolo, MD 04/11/13 1420 

## 2013-04-20 ENCOUNTER — Encounter (HOSPITAL_COMMUNITY): Payer: Self-pay | Admitting: Emergency Medicine

## 2013-04-20 ENCOUNTER — Emergency Department (HOSPITAL_COMMUNITY)
Admission: EM | Admit: 2013-04-20 | Discharge: 2013-04-21 | Disposition: A | Discharge status: Home / Self Care | Payer: Medicaid Other | Attending: Emergency Medicine | Admitting: Emergency Medicine

## 2013-04-20 DIAGNOSIS — E669 Obesity, unspecified: Secondary | ICD-10-CM | POA: Insufficient documentation

## 2013-04-20 DIAGNOSIS — Z79899 Other long term (current) drug therapy: Secondary | ICD-10-CM | POA: Insufficient documentation

## 2013-04-20 DIAGNOSIS — J45909 Unspecified asthma, uncomplicated: Secondary | ICD-10-CM | POA: Insufficient documentation

## 2013-04-20 DIAGNOSIS — Z3202 Encounter for pregnancy test, result negative: Secondary | ICD-10-CM | POA: Insufficient documentation

## 2013-04-20 DIAGNOSIS — N9489 Other specified conditions associated with female genital organs and menstrual cycle: Secondary | ICD-10-CM | POA: Insufficient documentation

## 2013-04-20 DIAGNOSIS — Z791 Long term (current) use of non-steroidal anti-inflammatories (NSAID): Secondary | ICD-10-CM | POA: Insufficient documentation

## 2013-04-20 DIAGNOSIS — N898 Other specified noninflammatory disorders of vagina: Secondary | ICD-10-CM

## 2013-04-20 DIAGNOSIS — Z91040 Latex allergy status: Secondary | ICD-10-CM | POA: Insufficient documentation

## 2013-04-20 DIAGNOSIS — R21 Rash and other nonspecific skin eruption: Secondary | ICD-10-CM | POA: Insufficient documentation

## 2013-04-20 DIAGNOSIS — R011 Cardiac murmur, unspecified: Secondary | ICD-10-CM | POA: Insufficient documentation

## 2013-04-20 DIAGNOSIS — Z880 Allergy status to penicillin: Secondary | ICD-10-CM | POA: Insufficient documentation

## 2013-04-20 LAB — URINALYSIS, ROUTINE W REFLEX MICROSCOPIC
Specific Gravity, Urine: 1.024 (ref 1.005–1.030)
Urobilinogen, UA: 1 mg/dL (ref 0.0–1.0)
pH: 5.5 (ref 5.0–8.0)

## 2013-04-20 LAB — URINE MICROSCOPIC-ADD ON

## 2013-04-20 LAB — PREGNANCY, URINE: Preg Test, Ur: NEGATIVE

## 2013-04-20 MED ORDER — DIPHENHYDRAMINE HCL 25 MG PO CAPS
25.0000 mg | ORAL_CAPSULE | Freq: Once | ORAL | Status: AC
  Administered 2013-04-20: 25 mg via ORAL
  Filled 2013-04-20: qty 1

## 2013-04-20 NOTE — ED Notes (Signed)
Pt reports pain and redness to outside of vagina, worse with walking. Pain and irritation started 1 hr ago after pt bathed. Pt states used a different brand of soap for the first time tonight.

## 2013-04-20 NOTE — ED Provider Notes (Signed)
CSN: [id number]     Arrival date & time 04/20/13  2218 History   First MD Initiated Contact with Patient 04/20/13 2222     Chief Complaint  Patient presents with  . Vaginal Pain   (Consider location/radiation/quality/duration/timing/severity/associated sxs/prior Treatment) Patient is a 42 y.o. female presenting with vaginal pain. The history is provided by the patient and medical records.  Vaginal Pain Associated symptoms include a rash.   This is a 42 year old female with past medical history significant for heart murmur and asthma, presenting to the ED for vaginal irritation. The patient states tonight when showering she used a new brand of "female soap" and afterwards her vaginal area began burning and felt very irritated. never used this soap before.  No hx of similar reactions.  Prior to today she has had no vaginal irritation or discharge. States when urinating a few minutes ago she did experience some burning but unsure if it is a UTI or just the exterior irritation.  No fevers, sweats, or chills.  Past Medical History  Diagnosis Date  . Obesity   . Heart murmur   . Borderline diabetes mellitus   . Congenital ventricular septal defect   . Asthma    Past Surgical History  Procedure Laterality Date  . Tubal ligation    . Cyst excision     Family History  Problem Relation Age of Onset  . Cancer Other   . Hyperlipidemia Other   . Hypertension Other   . Asthma Other    History  Substance Use Topics  . Smoking status: Never Smoker   . Smokeless tobacco: Never Used  . Alcohol Use: No   OB History   Grav Para Term Preterm Abortions TAB SAB Ect Mult Living                 Review of Systems  Skin: Positive for rash.  All other systems reviewed and are negative.    Allergies  Penicillins and Latex  Home Medications   Current Outpatient Rx  Name  Route  Sig  Dispense  Refill  . albuterol (PROVENTIL HFA;VENTOLIN HFA) 108 (90 BASE) MCG/ACT inhaler   Inhalation  Inhale 2 puffs into the lungs every 6 (six) hours as needed for wheezing or shortness of breath.          Marland Kitchen albuterol (PROVENTIL) (2.5 MG/3ML) 0.083% nebulizer solution   Nebulization   Take 2.5 mg by nebulization every 6 (six) hours as needed for wheezing or shortness of breath.          Marland Kitchen amLODipine (NORVASC) 10 MG tablet   Oral   Take 5 mg by mouth every morning.          . Calcium-Phosphorus-Vitamin D (CALCIUM GUMMIES) 250-100-500 MG-MG-UNIT CHEW   Oral   Chew 2 tablets by mouth every morning.          . cyclobenzaprine (FLEXERIL) 10 MG tablet   Oral   Take 0.5 tablets (5 mg total) by mouth 2 (two) times daily as needed for muscle spasms.   20 tablet   0   . Multiple Vitamin (MULTIVITAMIN WITH MINERALS) TABS tablet   Oral   Take 1 tablet by mouth every morning. "One a day Women's with energy"         . naproxen (NAPROSYN) 500 MG tablet   Oral   Take 1 tablet (500 mg total) by mouth 2 (two) times daily.   20 tablet   0   . norethindrone-ethinyl estradiol (  JUNEL FE,GILDESS FE,LOESTRIN FE) 1-20 MG-MCG tablet   Oral   Take 1 tablet by mouth every evening.          . nitroGLYCERIN (NITROSTAT) 0.4 MG SL tablet   Sublingual   Place 1 tablet (0.4 mg total) under the tongue every 5 (five) minutes x 3 doses as needed for chest pain.   25 tablet   1    BP 137/82  Temp(Src) 98.2 F (36.8 C) (Oral)  Resp 16  Ht 4\' 11"  (1.499 m)  Wt 169 lb (76.658 kg)  BMI 34.12 kg/m2  LMP 04/07/2013  Physical Exam  Nursing note and vitals reviewed. Constitutional: She is oriented to person, place, and time. She appears well-developed and well-nourished. No distress.  HENT:  Head: Normocephalic and atraumatic.  Mouth/Throat: Oropharynx is clear and moist.  Eyes: Conjunctivae and EOM are normal. Pupils are equal, round, and reactive to light.  Neck: Normal range of motion.  Cardiovascular: Normal rate, regular rhythm and normal heart sounds.   Pulmonary/Chest: Effort normal  and breath sounds normal. No respiratory distress. She has no wheezes.  Genitourinary:  Urticarial lesions of entire genital area; appears irritated; no ulceration, drainage, or signs of infection  Musculoskeletal: Normal range of motion.  Neurological: She is alert and oriented to person, place, and time.  Skin: Skin is warm and dry. She is not diaphoretic.  Psychiatric: She has a normal mood and affect.    ED Course  Procedures (including critical care time) Labs Review Labs Reviewed  URINALYSIS, ROUTINE W REFLEX MICROSCOPIC - Abnormal; Notable for the following:    APPearance CLOUDY (*)    Glucose, UA 250 (*)    Hgb urine dipstick SMALL (*)    Leukocytes, UA LARGE (*)    All other components within normal limits  URINE MICROSCOPIC-ADD ON - Abnormal; Notable for the following:    Squamous Epithelial / LPF FEW (*)    Bacteria, UA FEW (*)    All other components within normal limits  URINE CULTURE  PREGNANCY, URINE   Imaging Review No results found.  EKG Interpretation   None       MDM   1. Vaginal irritation    u-preg negative.  U/a as above, appears contaminated.  Will send for culture. Benadryl given in the ED with some relief of irritation.  Instructed she may continue using at home.  FU with PCP if further problems.  Discussed plan with pt, she agreed.  Return precautions advised.  Garlon Hatchet, PA-C 04/21/13 0040

## 2013-04-21 NOTE — ED Provider Notes (Signed)
Medical screening examination/treatment/procedure(s) were performed by non-physician practitioner and as supervising physician I was immediately available for consultation/collaboration.   Lashunta Frieden T Mihcael Ledee, MD 04/21/13 2319 

## 2013-04-22 LAB — URINE CULTURE

## 2013-05-19 ENCOUNTER — Encounter (HOSPITAL_COMMUNITY): Payer: Self-pay | Admitting: Emergency Medicine

## 2013-05-19 ENCOUNTER — Emergency Department (HOSPITAL_COMMUNITY)
Admission: EM | Admit: 2013-05-19 | Discharge: 2013-05-19 | Disposition: A | Discharge status: Home / Self Care | Payer: Medicaid Other | Attending: Emergency Medicine | Admitting: Emergency Medicine

## 2013-05-19 ENCOUNTER — Emergency Department (HOSPITAL_COMMUNITY): Payer: Medicaid Other

## 2013-05-19 DIAGNOSIS — Z880 Allergy status to penicillin: Secondary | ICD-10-CM | POA: Insufficient documentation

## 2013-05-19 DIAGNOSIS — R7309 Other abnormal glucose: Secondary | ICD-10-CM | POA: Insufficient documentation

## 2013-05-19 DIAGNOSIS — E669 Obesity, unspecified: Secondary | ICD-10-CM | POA: Insufficient documentation

## 2013-05-19 DIAGNOSIS — J45909 Unspecified asthma, uncomplicated: Secondary | ICD-10-CM | POA: Insufficient documentation

## 2013-05-19 DIAGNOSIS — Z79899 Other long term (current) drug therapy: Secondary | ICD-10-CM | POA: Insufficient documentation

## 2013-05-19 DIAGNOSIS — R071 Chest pain on breathing: Secondary | ICD-10-CM | POA: Insufficient documentation

## 2013-05-19 DIAGNOSIS — R0789 Other chest pain: Secondary | ICD-10-CM

## 2013-05-19 DIAGNOSIS — Z3202 Encounter for pregnancy test, result negative: Secondary | ICD-10-CM | POA: Insufficient documentation

## 2013-05-19 DIAGNOSIS — Q210 Ventricular septal defect: Secondary | ICD-10-CM | POA: Insufficient documentation

## 2013-05-19 DIAGNOSIS — Z91040 Latex allergy status: Secondary | ICD-10-CM | POA: Insufficient documentation

## 2013-05-19 DIAGNOSIS — R011 Cardiac murmur, unspecified: Secondary | ICD-10-CM | POA: Insufficient documentation

## 2013-05-19 DIAGNOSIS — R739 Hyperglycemia, unspecified: Secondary | ICD-10-CM

## 2013-05-19 LAB — BLOOD GAS, VENOUS
Acid-base deficit: 0.7 mmol/L (ref 0.0–2.0)
TCO2: 22 mmol/L (ref 0–100)
pCO2, Ven: 45.2 mmHg (ref 45.0–50.0)
pO2, Ven: 32 mmHg (ref 30.0–45.0)

## 2013-05-19 LAB — CBC WITH DIFFERENTIAL/PLATELET
Lymphocytes Relative: 23 % (ref 12–46)
Lymphs Abs: 1.3 10*3/uL (ref 0.7–4.0)
MCV: 86.8 fL (ref 78.0–100.0)
Neutrophils Relative %: 64 % (ref 43–77)
Platelets: 184 10*3/uL (ref 150–400)
RBC: 5.24 MIL/uL — ABNORMAL HIGH (ref 3.87–5.11)
WBC: 5.6 10*3/uL (ref 4.0–10.5)

## 2013-05-19 LAB — COMPREHENSIVE METABOLIC PANEL
ALT: 40 U/L — ABNORMAL HIGH (ref 0–35)
Alkaline Phosphatase: 97 U/L (ref 39–117)
CO2: 24 mEq/L (ref 19–32)
GFR calc Af Amer: >90 mL/min (ref >90)
GFR calc non Af Amer: >90 mL/min (ref >90)
Glucose, Bld: 467 mg/dL — ABNORMAL HIGH (ref 70–99)
Potassium: 4.3 mEq/L (ref 3.5–5.1)
Sodium: 130 mEq/L — ABNORMAL LOW (ref 135–145)

## 2013-05-19 LAB — URINE MICROSCOPIC-ADD ON

## 2013-05-19 LAB — URINALYSIS, ROUTINE W REFLEX MICROSCOPIC
Bilirubin Urine: NEGATIVE
Glucose, UA: >1000 mg/dL — AB
Ketones, ur: 40 mg/dL — AB
Leukocytes, UA: NEGATIVE
pH: 5.5 (ref 5.0–8.0)

## 2013-05-19 LAB — GLUCOSE, CAPILLARY: Glucose-Capillary: 286 mg/dL — ABNORMAL HIGH (ref 70–99)

## 2013-05-19 MED ORDER — GI COCKTAIL ~~LOC~~
30.0000 mL | Freq: Once | ORAL | Status: AC
  Administered 2013-05-19: 30 mL via ORAL
  Filled 2013-05-19: qty 30

## 2013-05-19 MED ORDER — METFORMIN HCL 500 MG PO TABS: 500.0000 mg | ORAL_TABLET | Freq: Two times a day (BID) | ORAL | Status: DC

## 2013-05-19 MED ORDER — SODIUM CHLORIDE 0.9 % IV BOLUS (SEPSIS)
1000.0000 mL | Freq: Once | INTRAVENOUS | Status: AC
  Administered 2013-05-19: 1000 mL via INTRAVENOUS

## 2013-05-19 MED ORDER — OMEPRAZOLE 20 MG PO CPDR: 20.0000 mg | DELAYED_RELEASE_CAPSULE | Freq: Every day | ORAL | Status: DC

## 2013-05-19 MED ORDER — INSULIN ASPART 100 UNIT/ML ~~LOC~~ SOLN
5.0000 [IU] | Freq: Once | SUBCUTANEOUS | Status: AC
  Administered 2013-05-19: 5 [IU] via SUBCUTANEOUS
  Filled 2013-05-19: qty 1

## 2013-05-19 NOTE — ED Provider Notes (Signed)
CSN: [id number]     Arrival date & time 05/19/13  0913 History   First MD Initiated Contact with Patient 05/19/13 [phone]     Chief Complaint  Patient presents with  . Near Syncope   (Consider location/radiation/quality/duration/timing/severity/associated sxs/prior Treatment) HPI Comments: Patient complains of a one day history of central chest pain comes on when she, drinks or coughs. She feels like something is getting stuck. She denies any difficulty swallowing or difficulty breathing. She denies any vomiting. No abdominal pain, fever or diarrhea. She's not had panic this in the past. Pain is reproducible to palpation. She has a history of a VSD. She is not on anticoagulation. She also endorses some lightheadedness that is worse when she goes from sitting to standing position. Denies any abdominal pain, nausea, vomiting or change in bowel habits.  The history is provided by the patient.    Past Medical History  Diagnosis Date  . Obesity   . Heart murmur   . Borderline diabetes mellitus   . Congenital ventricular septal defect   . Asthma    Past Surgical History  Procedure Laterality Date  . Tubal ligation    . Cyst excision     Family History  Problem Relation Age of Onset  . Cancer Other   . Hyperlipidemia Other   . Hypertension Other   . Asthma Other    History  Substance Use Topics  . Smoking status: Never Smoker   . Smokeless tobacco: Never Used  . Alcohol Use: No   OB History   Grav Para Term Preterm Abortions TAB SAB Ect Mult Living                 Review of Systems  Constitutional: Negative for fever, activity change and appetite change.  Respiratory: Positive for chest tightness. Negative for cough and shortness of breath.   Cardiovascular: Positive for chest pain.  Gastrointestinal: Negative for nausea, vomiting and abdominal pain.  Genitourinary: Negative for dysuria, hematuria, vaginal bleeding and vaginal discharge.  Musculoskeletal: Negative for back  pain.  Skin: Negative for rash.  Neurological: Negative for dizziness, weakness and headaches.  A complete 10 system review of systems was obtained and all systems are negative except as noted in the HPI and PMH. \   Allergies  Penicillins and Latex  Home Medications   Current Outpatient Rx  Name  Route  Sig  Dispense  Refill  . albuterol (PROVENTIL HFA;VENTOLIN HFA) 108 (90 BASE) MCG/ACT inhaler   Inhalation   Inhale 2 puffs into the lungs every 6 (six) hours as needed for wheezing or shortness of breath.          Marland Kitchen albuterol (PROVENTIL) (2.5 MG/3ML) 0.083% nebulizer solution   Nebulization   Take 2.5 mg by nebulization every 6 (six) hours as needed for wheezing or shortness of breath.          Marland Kitchen amLODipine (NORVASC) 10 MG tablet   Oral   Take 5 mg by mouth every morning.          . metroNIDAZOLE (METROGEL) 0.75 % vaginal gel   Vaginal   Place 1 Applicatorful vaginally at bedtime.         . nitroGLYCERIN (NITROSTAT) 0.4 MG SL tablet   Sublingual   Place 0.4 mg under the tongue every 5 (five) minutes as needed for chest pain.         Marland Kitchen triamcinolone cream (KENALOG) 0.1 %   Topical   Apply 1 application topically daily.         Marland Kitchen  metFORMIN (GLUCOPHAGE) 500 MG tablet   Oral   Take 1 tablet (500 mg total) by mouth 2 (two) times daily with a meal.   60 tablet   0   . omeprazole (PRILOSEC) 20 MG capsule   Oral   Take 1 capsule (20 mg total) by mouth daily.   30 capsule   0    BP 126/102  Pulse 92  Temp(Src) 98.2 F (36.8 C) (Oral)  Resp 17  SpO2 98% Physical Exam  Constitutional: She is oriented to person, place, and time. She appears well-developed and well-nourished. No distress.  HENT:  Head: Normocephalic and atraumatic.  Mouth/Throat: Oropharynx is clear and moist. No oropharyngeal exudate.  Eyes: Conjunctivae and EOM are normal. Pupils are equal, round, and reactive to light.  Neck: Normal range of motion. Neck supple.  Cardiovascular:  Normal rate, regular rhythm and normal heart sounds.   No murmur heard. Pulmonary/Chest: Effort normal and breath sounds normal. No respiratory distress. She exhibits tenderness.  Central chest tenderness to palpation, no rash.  Abdominal: Soft. There is no tenderness. There is no rebound and no guarding.  Musculoskeletal: Normal range of motion. She exhibits no edema and no tenderness.  Neurological: She is alert and oriented to person, place, and time. No cranial nerve deficit. She exhibits normal muscle tone. Coordination normal.  CN 2-12 intact, no ataxia on finger to nose, no nystagmus, 5/5 strength throughout, no pronator drift, Romberg negative, normal gait.   Skin: Skin is warm.    ED Course  Procedures (including critical care time) Labs Review Labs Reviewed  CBC WITH DIFFERENTIAL - Abnormal; Notable for the following:    RBC 5.24 (*)    Hemoglobin 16.3 (*)    All other components within normal limits  COMPREHENSIVE METABOLIC PANEL - Abnormal; Notable for the following:    Sodium 130 (*)    Chloride 93 (*)    Glucose, Bld 467 (*)    AST 45 (*)    ALT 40 (*)    All other components within normal limits  URINALYSIS, ROUTINE W REFLEX MICROSCOPIC - Abnormal; Notable for the following:    Specific Gravity, Urine 1.045 (*)    Glucose, UA >1000 (*)    Hgb urine dipstick TRACE (*)    Ketones, ur 40 (*)    All other components within normal limits  BLOOD GAS, VENOUS - Abnormal; Notable for the following:    pH, Ven 7.355 (*)    Bicarbonate 24.6 (*)    All other components within normal limits  GLUCOSE, CAPILLARY - Abnormal; Notable for the following:    Glucose-Capillary 286 (*)    All other components within normal limits  LIPASE, BLOOD  D-DIMER, QUANTITATIVE  TROPONIN I  PREGNANCY, URINE  KETONES, QUALITATIVE  URINE MICROSCOPIC-ADD ON   Imaging Review Dg Chest 2 View  05/19/2013   CLINICAL DATA:  History of hypertension. Patient feels that an object is stuck in her  chest.  EXAM: CHEST  2 VIEW  COMPARISON:  Chest x-ray 02/04/2013.  FINDINGS: Lung volumes are normal. No consolidative airspace disease. No pleural effusions. No pneumothorax. No pulmonary nodule or mass noted. Pulmonary vasculature and the cardiomediastinal silhouette are within normal limits. No foreign body identified.  IMPRESSION: 1.  No radiographic evidence of acute cardiopulmonary disease. 2. Specifically, no unexpected radiopaque foreign body identified.   Electronically Signed   By: Trudie Reed M.D.   On: 05/19/2013 10:31    EKG Interpretation    Date/Time:  Sunday  May 19 2013 09:45:28 EST Ventricular Rate:  102 PR Interval:  156 QRS Duration: 77 QT Interval:  357 QTC Calculation: 465 R Axis:   83 Text Interpretation:  Sinus tachycardia Probable left ventricular hypertrophy ST elev, probable normal early repol pattern Baseline wander in lead(s) I II aVR stable J point elevation No significant change was found Confirmed by Manus Gunning  MD, Theadore Blunck ([id number]) on 05/19/2013 9:50:50 AM            MDM   1. Chest wall pain   2. Hyperglycemia    Chest wall pain is worsened with eating, drinking and coughing. No shortness of breath, nausea or vomiting. No difficulty swallowing.  Chest x-ray with no abnormalities. Troponin negative. D-dimer negative. No evidence of ACS or PE.   Hyperglycemia with normal anion gap of 13. Patient reports history of "borderline diabetes" although she is not on any hypoglycemic medications.  Patient given IV fluids, GI cocktail and antiemetics. No difficulty swallowing. Repeat sugar is 286. No vomiting in the ED. She's tolerating by mouth. Informed patient that she likely has diabetes. Will start metformin. No evidence of DKA. Renal function normal.  BP 126/102  Pulse 92  Temp(Src) 98.2 F (36.8 C) (Oral)  Resp 17  SpO2 98%   Glynn Octave, MD 05/19/13 [phone]

## 2013-05-19 NOTE — ED Notes (Signed)
Pt. Aware of giving a urine specimen. Pt. Urinated and forgot to get the specimen. Pt. will get a specimen when return the bathroom. Nurses was notified.

## 2013-05-19 NOTE — ED Notes (Signed)
Pt complain of vertigo and weakness since yesterday

## 2013-05-19 NOTE — ED Notes (Signed)
Pt. Stated that she felt dizzy and light headed while standing for her orthostatics.  Nurse was notified.

## 2013-05-19 NOTE — ED Notes (Signed)
Pt states she has central chest pain when she eats drinks or coughs. Pt states it feels like her food is getting stuck

## 2013-05-30 ENCOUNTER — Encounter: Payer: Medicaid Other | Attending: Cardiology

## 2013-05-30 VITALS — Ht 59.0 in | Wt 161.0 lb

## 2013-05-30 DIAGNOSIS — Z713 Dietary counseling and surveillance: Secondary | ICD-10-CM | POA: Insufficient documentation

## 2013-05-30 DIAGNOSIS — E119 Type 2 diabetes mellitus without complications: Secondary | ICD-10-CM

## 2013-06-04 NOTE — Progress Notes (Signed)
Patient was seen on 05/30/13 for the first of a series of three diabetes self-management courses at the [facility].  Current HbA1c: 5.9%  The following learning objectives were met by the patient during this class:  Describe diabetes  State some common risk factors for diabetes  Defines the role of glucose and insulin  Identifies type of diabetes and pathophysiology  Describe the relationship between diabetes and cardiovascular risk  State the members of the Healthcare Team  States the rationale for glucose monitoring  State when to test glucose  State their individual Target Range  State the importance of logging glucose readings  Describe how to interpret glucose readings  Identifies A1C target  Explain the correlation between A1c and eAG values  State symptoms and treatment of high blood glucose  State symptoms and treatment of low blood glucose  Explain proper technique for glucose testing  Identifies proper sharps disposal  Handouts given during class include:  Living Well with Diabetes book  Carb Counting and Meal Planning book  Meal Plan Card  Carbohydrate guide  Meal planning worksheet  Low Sodium Flavoring Tips  The diabetes portion plate  Low Carbohydrate Snack Suggestions  A1c to eAG Conversion Chart  Diabetes Medications  Stress Management  Diabetes Recommended Care Schedule  Diabetes Success Plan  Core Class Satisfaction Survey  Your patient has identified their diabetes care support plan as:  [facility]  Staff  Follow-Up Plan:  Attend core 2

## 2013-06-06 ENCOUNTER — Emergency Department (HOSPITAL_COMMUNITY)
Admission: EM | Admit: 2013-06-06 | Discharge: 2013-06-06 | Disposition: A | Discharge status: Home / Self Care | Payer: Medicaid Other | Attending: Emergency Medicine | Admitting: Emergency Medicine

## 2013-06-06 ENCOUNTER — Encounter (HOSPITAL_COMMUNITY): Payer: Self-pay | Admitting: Emergency Medicine

## 2013-06-06 DIAGNOSIS — Z3202 Encounter for pregnancy test, result negative: Secondary | ICD-10-CM | POA: Insufficient documentation

## 2013-06-06 DIAGNOSIS — Z880 Allergy status to penicillin: Secondary | ICD-10-CM | POA: Insufficient documentation

## 2013-06-06 DIAGNOSIS — E119 Type 2 diabetes mellitus without complications: Secondary | ICD-10-CM | POA: Insufficient documentation

## 2013-06-06 DIAGNOSIS — J45909 Unspecified asthma, uncomplicated: Secondary | ICD-10-CM | POA: Insufficient documentation

## 2013-06-06 DIAGNOSIS — R011 Cardiac murmur, unspecified: Secondary | ICD-10-CM | POA: Insufficient documentation

## 2013-06-06 DIAGNOSIS — Z91040 Latex allergy status: Secondary | ICD-10-CM | POA: Insufficient documentation

## 2013-06-06 DIAGNOSIS — Q210 Ventricular septal defect: Secondary | ICD-10-CM | POA: Insufficient documentation

## 2013-06-06 DIAGNOSIS — E669 Obesity, unspecified: Secondary | ICD-10-CM | POA: Insufficient documentation

## 2013-06-06 DIAGNOSIS — R739 Hyperglycemia, unspecified: Secondary | ICD-10-CM

## 2013-06-06 DIAGNOSIS — Z79899 Other long term (current) drug therapy: Secondary | ICD-10-CM | POA: Insufficient documentation

## 2013-06-06 LAB — POCT PREGNANCY, URINE: Preg Test, Ur: NEGATIVE

## 2013-06-06 LAB — GLUCOSE, CAPILLARY
Glucose-Capillary: 358 mg/dL — ABNORMAL HIGH (ref 70–99)
Glucose-Capillary: 333 mg/dL — ABNORMAL HIGH (ref 70–99)

## 2013-06-06 LAB — URINALYSIS, ROUTINE W REFLEX MICROSCOPIC
Glucose, UA: >1000 mg/dL — AB
Hgb urine dipstick: NEGATIVE
Ketones, ur: NEGATIVE mg/dL
Leukocytes, UA: NEGATIVE
Nitrite: NEGATIVE
Specific Gravity, Urine: 1.035 — ABNORMAL HIGH (ref 1.005–1.030)
pH: 6 (ref 5.0–8.0)

## 2013-06-06 LAB — COMPREHENSIVE METABOLIC PANEL
ALT: 32 U/L (ref 0–35)
AST: 32 U/L (ref 0–37)
Albumin: 4.1 g/dL (ref 3.5–5.2)
Calcium: 9.8 mg/dL (ref 8.4–10.5)
Chloride: 96 mEq/L (ref 96–112)
Creatinine, Ser: 0.66 mg/dL (ref 0.50–1.10)
Total Bilirubin: 0.6 mg/dL (ref 0.3–1.2)
Total Protein: 7.5 g/dL (ref 6.0–8.3)

## 2013-06-06 LAB — URINE MICROSCOPIC-ADD ON

## 2013-06-06 LAB — CBC
MCHC: 34.7 g/dL (ref 30.0–36.0)
MCV: 88.1 fL (ref 78.0–100.0)
Platelets: 212 10*3/uL (ref 150–400)
RDW: 12.8 % (ref 11.5–15.5)
WBC: 4.4 10*3/uL (ref 4.0–10.5)

## 2013-06-06 MED ORDER — SODIUM CHLORIDE 0.9 % IV BOLUS (SEPSIS)
1000.0000 mL | Freq: Once | INTRAVENOUS | Status: AC
  Administered 2013-06-06: 1000 mL via INTRAVENOUS

## 2013-06-06 NOTE — Progress Notes (Signed)
Patient was seen on 06/06/13 for the second of a series of three diabetes self-management courses at the [facility]. The following learning objectives were met by the patient during this class:   Describe the role of different macronutrients on glucose  Explain how carbohydrates affect blood glucose  State what foods contain the most carbohydrates  Demonstrate carbohydrate counting  Demonstrate how to read Nutrition Facts food label  Describe effects of various fats on heart health  Describe the importance of good nutrition for health and healthy eating strategies  Describe techniques for managing your shopping, cooking and meal planning  List strategies to follow meal plan when dining out  Describe the effects of alcohol on glucose and how to use it safely  Goals:  Follow Diabetes Meal Plan as instructed  Eat 3 meals and 2 snacks, every 3-5 hrs  Aim for 2-3 Carb Choices per meal (30-45 grams) +/- 1 either way  Aim for 0-2 Carbs per snack if hungry  Add lean protein foods to meals/snacks  Monitor glucose levels as instructed by your doctor   Follow-Up Plan:  Attend Core 3  Work towards following your personal food plan.

## 2013-06-06 NOTE — ED Notes (Addendum)
Pt reports difficulty controlling her blood sugar. Pt reports her CBG's at home have been running between 200-400 despite taking her diabetic medications as prescribed. Pt reports recently being diagnosed with diabetes two weeks ago. Pt reports nausea and diarrhea, however contributes this to metformin. Pt reports polydipsia and polyuria. CBG on arrival is 358. Pt is A/O x4, vital signs are WDL, and pt is in NAD.

## 2013-06-06 NOTE — ED Provider Notes (Signed)
Medical screening examination/treatment/procedure(s) were performed by non-physician practitioner and as supervising physician I was immediately available for consultation/collaboration.  EKG Interpretation   None         Audree Camel, MD 06/06/13 2249

## 2013-06-06 NOTE — ED Provider Notes (Signed)
CSN: [id number]     Arrival date & time 06/06/13  1341 History   First MD Initiated Contact with Patient 06/06/13 1502     Chief Complaint  Patient presents with  . Hyperglycemia   (Consider location/radiation/quality/duration/timing/severity/associated sxs/prior Treatment) HPI Comments: Patient who was recently diagnosed with DM type II two weeks ago currently on Metformin 500 mg BID presents today with a chief complaint of hyperglycemia.  She reports that she has been checking her blood sugars over the past 2 weeks and her blood sugar has been running between 200 and 400.  She states that she has been compliant with her Metformin and has not missed any doses.  She denies any recent illness.  She does report polyuria and polydipsia, but denies other urinary symptoms.  She denies nausea, vomiting, abdominal pain, vision changes, fever, or chills.  PCP is Dr. Shana Chute.    Patient is a 42 y.o. female presenting with hyperglycemia. The history is provided by the patient.  Hyperglycemia   Past Medical History  Diagnosis Date  . Obesity   . Heart murmur   . Borderline diabetes mellitus   . Congenital ventricular septal defect   . Asthma   . Diabetes mellitus without complication    Past Surgical History  Procedure Laterality Date  . Tubal ligation    . Cyst excision     Family History  Problem Relation Age of Onset  . Cancer Other   . Hyperlipidemia Other   . Hypertension Other   . Asthma Other    History  Substance Use Topics  . Smoking status: Never Smoker   . Smokeless tobacco: Never Used  . Alcohol Use: No   OB History   Grav Para Term Preterm Abortions TAB SAB Ect Mult Living                 Review of Systems  All other systems reviewed and are negative.    Allergies  Penicillins and Latex  Home Medications   Current Outpatient Rx  Name  Route  Sig  Dispense  Refill  . amLODipine (NORVASC) 10 MG tablet   Oral   Take 5 mg by mouth every morning.           . metFORMIN (GLUCOPHAGE) 500 MG tablet   Oral   Take 1 tablet (500 mg total) by mouth 2 (two) times daily with a meal.   60 tablet   0   . nitroGLYCERIN (NITROSTAT) 0.4 MG SL tablet   Sublingual   Place 0.4 mg under the tongue every 5 (five) minutes as needed for chest pain.          BP 116/70  Pulse 89  Temp(Src) 98.4 F (36.9 C) (Oral)  Resp 20  SpO2 99% Physical Exam  Nursing note and vitals reviewed. Constitutional: She appears well-developed and well-nourished.  HENT:  Head: Normocephalic and atraumatic.  Mouth/Throat: Oropharynx is clear and moist.  Neck: Normal range of motion. Neck supple.  Cardiovascular: Normal rate and regular rhythm.   Murmur heard. Pulmonary/Chest: Effort normal and breath sounds normal.  Abdominal: Soft. Bowel sounds are normal. She exhibits no distension and no mass. There is no tenderness. There is no rebound and no guarding.  Musculoskeletal: Normal range of motion.  Neurological: She is alert.  Skin: Skin is warm and dry.  Psychiatric: She has a normal mood and affect.    ED Course  Procedures (including critical care time) Labs Review Labs Reviewed  GLUCOSE, CAPILLARY -  Abnormal; Notable for the following:    Glucose-Capillary 358 (*)    All other components within normal limits  COMPREHENSIVE METABOLIC PANEL - Abnormal; Notable for the following:    Sodium 133 (*)    Glucose, Bld 372 (*)    All other components within normal limits  URINALYSIS, ROUTINE W REFLEX MICROSCOPIC - Abnormal; Notable for the following:    APPearance CLOUDY (*)    Specific Gravity, Urine 1.035 (*)    Glucose, UA >1000 (*)    All other components within normal limits  CBC  URINE MICROSCOPIC-ADD ON  POCT PREGNANCY, URINE   Imaging Review No results found.  EKG Interpretation   None       MDM  No diagnosis found. Patient who was recently diagnosed with DM two weeks ago presents today with hyperglycemia.  Initial CBG is 358.  No ketones in  the urine.  Anion gap=10.  No nausea, vomiting, or abdominal pain.  Therefore, do not feel that the patient is in DKA.  Patient treated with IVF.  CBG slowly trended down, but patient was eating in her room.  Patient currently on Metformin and reports that she has been compliant with her medication.  She is currently not on insulin.  Patient instructed to follow up with PCP for additional management of diabetes.    Santiago Glad, PA-C 06/06/13 1905

## 2013-06-13 DIAGNOSIS — E119 Type 2 diabetes mellitus without complications: Secondary | ICD-10-CM

## 2013-06-13 NOTE — Progress Notes (Signed)
Patient was seen on 06/13/13 for the third of a series of three diabetes self-management courses at the [facility]. The following learning objectives were met by the patient during this class:    State the amount of activity recommended for healthy living   Describe activities suitable for individual needs   Identify ways to regularly incorporate activity into daily life   Identify barriers to activity and ways to over come these barriers  Identify diabetes medications being personally used and their primary action for lowering glucose and possible side effects   Describe role of stress on blood glucose and develop strategies to address psychosocial issues   Identify diabetes complications and ways to prevent them  Explain how to manage diabetes during illness   Evaluate success in meeting personal goal   Establish 2-3 goals that they will plan to diligently work on until they return for the  40-month follow-up visit  Goals:  Follow Diabetes Meal Plan as instructed  Aim for 15-30 mins of physical activity daily as tolerated  Bring food record and glucose log to your follow up visit  Your patient has established the following 4 month goals in their individualized success plan:  Eating right  Walking more  Your patient has identified these potential barriers to change:  I need to get more help with family and friends  Your patient has identified their diabetes self-care support plan as  [facility] Support group  Family

## 2013-06-22 ENCOUNTER — Encounter (HOSPITAL_COMMUNITY): Payer: Self-pay | Admitting: Emergency Medicine

## 2013-06-22 ENCOUNTER — Emergency Department (HOSPITAL_COMMUNITY)
Admission: EM | Admit: 2013-06-22 | Discharge: 2013-06-22 | Disposition: A | Discharge status: Home / Self Care | Payer: Medicaid Other | Attending: Emergency Medicine | Admitting: Emergency Medicine

## 2013-06-22 DIAGNOSIS — H539 Unspecified visual disturbance: Secondary | ICD-10-CM

## 2013-06-22 DIAGNOSIS — Z91040 Latex allergy status: Secondary | ICD-10-CM | POA: Insufficient documentation

## 2013-06-22 DIAGNOSIS — Z880 Allergy status to penicillin: Secondary | ICD-10-CM | POA: Insufficient documentation

## 2013-06-22 DIAGNOSIS — R011 Cardiac murmur, unspecified: Secondary | ICD-10-CM | POA: Insufficient documentation

## 2013-06-22 DIAGNOSIS — H538 Other visual disturbances: Secondary | ICD-10-CM | POA: Insufficient documentation

## 2013-06-22 DIAGNOSIS — Z79899 Other long term (current) drug therapy: Secondary | ICD-10-CM | POA: Insufficient documentation

## 2013-06-22 DIAGNOSIS — J45909 Unspecified asthma, uncomplicated: Secondary | ICD-10-CM | POA: Insufficient documentation

## 2013-06-22 DIAGNOSIS — E669 Obesity, unspecified: Secondary | ICD-10-CM | POA: Insufficient documentation

## 2013-06-22 DIAGNOSIS — E119 Type 2 diabetes mellitus without complications: Secondary | ICD-10-CM | POA: Insufficient documentation

## 2013-06-22 DIAGNOSIS — Q210 Ventricular septal defect: Secondary | ICD-10-CM | POA: Insufficient documentation

## 2013-06-22 NOTE — ED Provider Notes (Signed)
CSN: [id number]     Arrival date & time 06/22/13  4696 History   First MD Initiated Contact with Patient 06/22/13 [phone]     Chief Complaint  Patient presents with  . Visual Field Change    HPI Patient reports blurred vision out of both of her eyes over the past 2 weeks.  She states she's having trouble with both her near vision as well as her far vision.  She's been told before that she is supposed to wear glasses but she states she doesn't usually wear these.  When she puts them on she states her vision is still blurred.  She denies eye pain.  No discharge from her eyes.  No headache.  She denies recent injury or trauma.  No upper respiratory symptoms.  No weakness of her arms or legs.  No other complaints.   Past Medical History  Diagnosis Date  . Obesity   . Heart murmur   . Borderline diabetes mellitus   . Congenital ventricular septal defect   . Asthma   . Diabetes mellitus without complication    Past Surgical History  Procedure Laterality Date  . Tubal ligation    . Cyst excision     Family History  Problem Relation Age of Onset  . Cancer Other   . Hyperlipidemia Other   . Hypertension Other   . Asthma Other    History  Substance Use Topics  . Smoking status: Never Smoker   . Smokeless tobacco: Never Used  . Alcohol Use: No   OB History   Grav Para Term Preterm Abortions TAB SAB Ect Mult Living                 Review of Systems  All other systems reviewed and are negative.    Allergies  Penicillins and Latex  Home Medications   Current Outpatient Rx  Name  Route  Sig  Dispense  Refill  . amLODipine (NORVASC) 10 MG tablet   Oral   Take 5 mg by mouth every morning.          . metFORMIN (GLUCOPHAGE) 500 MG tablet   Oral   Take 1 tablet (500 mg total) by mouth 2 (two) times daily with a meal.   60 tablet   0   . nitroGLYCERIN (NITROSTAT) 0.4 MG SL tablet   Sublingual   Place 0.4 mg under the tongue every 5 (five) minutes as needed for chest  pain.          BP 107/69  Pulse 88  Temp(Src) 97.9 F (36.6 C) (Oral)  Resp 20  SpO2 98% Physical Exam  Nursing note and vitals reviewed. Constitutional: She is oriented to person, place, and time. She appears well-developed and well-nourished.  HENT:  Head: Normocephalic.  Eyes: Conjunctivae and EOM are normal. Pupils are equal, round, and reactive to light. Right eye exhibits no chemosis, no discharge and no exudate. Left eye exhibits no chemosis, no discharge and no exudate. Right conjunctiva is not injected. Left conjunctiva is not injected.  Neck: Normal range of motion.  Pulmonary/Chest: Effort normal.  Abdominal: She exhibits no distension.  Musculoskeletal: Normal range of motion.  Neurological: She is alert and oriented to person, place, and time.  Psychiatric: She has a normal mood and affect.    ED Course  Procedures (including critical care time) Labs Review Labs Reviewed - No data to display Imaging Review No results found.  EKG Interpretation   None  MDM   1. Visual changes    the patient is having an issue with refraction.  Will need outpatient optometry followup.  No indication for additional testing in the emergency department    Lyanne Co, MD 06/22/13 [phone]

## 2013-06-22 NOTE — ED Notes (Signed)
Pt states she has been having blurry vision for the last 2 weeks, states last time it was related to her birth control, not sure what is causing it this time. CBG at home this am 103, pt shows no signs of distress although reports periods of dizziness.

## 2013-06-24 ENCOUNTER — Emergency Department (HOSPITAL_COMMUNITY)
Admission: EM | Admit: 2013-06-24 | Discharge: 2013-06-24 | Disposition: A | Discharge status: Home / Self Care | Payer: Medicaid Other | Attending: Emergency Medicine | Admitting: Emergency Medicine

## 2013-06-24 ENCOUNTER — Encounter (HOSPITAL_COMMUNITY): Payer: Self-pay | Admitting: Emergency Medicine

## 2013-06-24 DIAGNOSIS — Z91040 Latex allergy status: Secondary | ICD-10-CM | POA: Insufficient documentation

## 2013-06-24 DIAGNOSIS — R011 Cardiac murmur, unspecified: Secondary | ICD-10-CM | POA: Insufficient documentation

## 2013-06-24 DIAGNOSIS — B9789 Other viral agents as the cause of diseases classified elsewhere: Secondary | ICD-10-CM | POA: Insufficient documentation

## 2013-06-24 DIAGNOSIS — B349 Viral infection, unspecified: Secondary | ICD-10-CM

## 2013-06-24 DIAGNOSIS — E119 Type 2 diabetes mellitus without complications: Secondary | ICD-10-CM | POA: Insufficient documentation

## 2013-06-24 DIAGNOSIS — Z79899 Other long term (current) drug therapy: Secondary | ICD-10-CM | POA: Insufficient documentation

## 2013-06-24 DIAGNOSIS — J45909 Unspecified asthma, uncomplicated: Secondary | ICD-10-CM | POA: Insufficient documentation

## 2013-06-24 DIAGNOSIS — E669 Obesity, unspecified: Secondary | ICD-10-CM | POA: Insufficient documentation

## 2013-06-24 DIAGNOSIS — Z880 Allergy status to penicillin: Secondary | ICD-10-CM | POA: Insufficient documentation

## 2013-06-24 MED ORDER — ACETAMINOPHEN 325 MG PO TABS
650.0000 mg | ORAL_TABLET | Freq: Once | ORAL | Status: AC
  Administered 2013-06-24: 650 mg via ORAL
  Filled 2013-06-24: qty 2

## 2013-06-24 MED ORDER — OSELTAMIVIR PHOSPHATE 75 MG PO CAPS: 75.0000 mg | ORAL_CAPSULE | Freq: Two times a day (BID) | ORAL | Status: DC

## 2013-06-24 MED ORDER — ONDANSETRON HCL 4 MG PO TABS: 4.0000 mg | ORAL_TABLET | Freq: Four times a day (QID) | ORAL | Status: DC

## 2013-06-24 NOTE — ED Provider Notes (Signed)
TIME SEEN: 11:43 AM  CHIEF COMPLAINT: Body aches, headache, sore throat, fever, cough  HPI: Patient is a 42 year old female with a history of non-insulin-dependent diabetes, ventricular septal defect, asthma who presents to the emergency department with subjective fever, sore throat, headache, body aches, nonproductive cough that started last night. She denies any vomiting or diarrhea. She states her daughter has similar symptoms. She did not get a flu shot this year. No recent international travel. No chest pain or shortness of breath. No abdominal pain.  ROS: See HPI Constitutional: Subjective fever  Eyes: no drainage  ENT: no runny nose   Cardiovascular:  no chest pain  Resp: no SOB  GI: no vomiting GU: no dysuria Integumentary: no rash  Allergy: no hives  Musculoskeletal: no leg swelling  Neurological: no slurred speech ROS otherwise negative  PAST MEDICAL HISTORY/PAST SURGICAL HISTORY:  Past Medical History  Diagnosis Date  . Obesity   . Heart murmur   . Borderline diabetes mellitus   . Congenital ventricular septal defect   . Asthma   . Diabetes mellitus without complication     MEDICATIONS:  Prior to Admission medications   Medication Sig Start Date End Date Taking? Authorizing Provider  Alogliptin-Metformin HCl (KAZANO) 12.10-998 MG TABS Take 1 tablet by mouth 2 (two) times daily.   Yes Historical Provider, MD  amLODipine (NORVASC) 10 MG tablet Take 5 mg by mouth every morning.    Yes Historical Provider, MD  valACYclovir (VALTREX) 500 MG tablet Take 500 mg by mouth daily.   Yes Historical Provider, MD  nitroGLYCERIN (NITROSTAT) 0.4 MG SL tablet Place 0.4 mg under the tongue every 5 (five) minutes as needed for chest pain.    Historical Provider, MD    ALLERGIES:  Allergies  Allergen Reactions  . Penicillins Anaphylaxis  . Latex Rash    SOCIAL HISTORY:  History  Substance Use Topics  . Smoking status: Never Smoker   . Smokeless tobacco: Never Used  .  Alcohol Use: No    FAMILY HISTORY: Family History  Problem Relation Age of Onset  . Cancer Other   . Hyperlipidemia Other   . Hypertension Other   . Asthma Other     EXAM: BP 150/80  Pulse 104  Temp(Src) 100.6 F (38.1 C) (Oral)  Resp 18  SpO2 99% CONSTITUTIONAL: Alert and oriented and responds appropriately to questions. Well-appearing; well-nourished, nontoxic HEAD: Normocephalic EYES: Conjunctivae clear, PERRL ENT: normal nose; no rhinorrhea; moist mucous membranes; pharynx without lesions noted NECK: Supple, no meningismus, no LAD  CARD: RRR; S1 and S2 appreciated; loud systolic murmur, no clicks, no rubs, no gallops RESP: Normal chest excursion without splinting or tachypnea; breath sounds clear and equal bilaterally; no wheezes, no rhonchi, no rales,  ABD/GI: Normal bowel sounds; non-distended; soft, non-tender, no rebound, no guarding BACK:  The back appears normal and is non-tender to palpation, there is no CVA tenderness EXT: Normal ROM in all joints; non-tender to palpation; no edema; normal capillary refill; no cyanosis    SKIN: Normal color for age and race; warm NEURO: Moves all extremities equally PSYCH: The patient's mood and manner are appropriate. Grooming and personal hygiene are appropriate.  MEDICAL DECISION MAKING: Patient here with flulike symptoms. She is febrile but otherwise hemodynamically stable. Her lungs are clear to auscultation. She is nontoxic appearing. No meningismus on exam. Doubt pneumonia, meningitis, bacteremia. Patient has been given Tylenol in triage. I do not feel she needs labs or imaging at this time. Have discussed with patient  supportive care instructions and alternating Tylenol and Motrin and encouraging oral fluids. Given strict return precautions. Have offered patient a prescription for Tamiflu and Zofran. Patient verbalizes understanding and is comfortable with plan.     Layla Maw Ward, DO 06/24/13 1204

## 2013-06-24 NOTE — ED Notes (Signed)
Pt c/o cough, fever, sore throat, headache, and body aches starting last night.  Pain score 8/10.

## 2013-08-06 ENCOUNTER — Encounter: Payer: Self-pay | Admitting: Cardiology

## 2013-08-06 ENCOUNTER — Encounter: Payer: Self-pay | Admitting: *Deleted

## 2013-08-06 ENCOUNTER — Ambulatory Visit (INDEPENDENT_AMBULATORY_CARE_PROVIDER_SITE_OTHER): Payer: Medicaid Other | Admitting: Cardiology

## 2013-08-06 VITALS — BP 135/83 | HR 98 | Ht 59.0 in | Wt 164.0 lb

## 2013-08-06 DIAGNOSIS — E669 Obesity, unspecified: Secondary | ICD-10-CM

## 2013-08-06 DIAGNOSIS — Q210 Ventricular septal defect: Secondary | ICD-10-CM

## 2013-08-06 DIAGNOSIS — Q2542 Hypoplasia of aorta: Principal | ICD-10-CM

## 2013-08-06 DIAGNOSIS — Q254 Congenital malformation of aorta unspecified: Secondary | ICD-10-CM

## 2013-08-06 DIAGNOSIS — R55 Syncope and collapse: Secondary | ICD-10-CM

## 2013-08-06 DIAGNOSIS — E119 Type 2 diabetes mellitus without complications: Secondary | ICD-10-CM | POA: Insufficient documentation

## 2013-08-06 DIAGNOSIS — I10 Essential (primary) hypertension: Secondary | ICD-10-CM

## 2013-08-06 DIAGNOSIS — R000 Tachycardia, unspecified: Secondary | ICD-10-CM

## 2013-08-06 NOTE — Progress Notes (Signed)
[name], female   DOB: 06-Apr-1971, 43 y.o.   MRN: [id number] Due to allergic reactions to electrodes, patient enrolled with [facility] for an IMD Post Event 14 day cardiac event monitor to be mailed to the home.

## 2013-08-06 NOTE — Progress Notes (Signed)
[name], female   DOB: 09-04-1970, 43 y.o.   MRN: [id number]    Patient Name: Mackenzie Patterson Date of Encounter: 08/06/2013  Primary Care Provider:  [name], MD Primary Cardiologist:  Ena Dawley, H  Problem List   Past Medical History  Diagnosis Date  . Obesity   . Heart murmur   . Borderline diabetes mellitus   . Congenital ventricular septal defect   . Asthma   . Diabetes mellitus without complication    Past Surgical History  Procedure Laterality Date  . Tubal ligation    . Cyst excision     Allergies  Allergies  Allergen Reactions  . Penicillins Anaphylaxis  . 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 The patient is not a good historian and is not sure if she was followed by cardiologists in the past and states that her primary care physician is her heart doctor. She states that possible cardiac surgery has never been discussed with her. Her main complaint today is "heart fluttering" that starts after moderate activity and it can last up to 20-30 minutes. These palpitations are associated with lightheadedness, chest pain and shortness of breath and are happening on a daily basis. Patient also states that she usually sleeps on 2 pillows but recently she has been waking up at night feeling short of breath and upright position helps. She also states that she is getting progressively worsening dyspnea on exertion like walking to the mailbox that is about 50 feet from her house will make her short of breath.  In June of 2014 she had a syncopal episode associated with chest pain and underwent a Lexiscan  nuclear stress test that showed normal left ventricular ejection fraction fraction, no scar or ischemia. An echocardiogram at that time showed mildly dilated left ventricle with preserved ejection fraction,  membranous VSD not further specified and severe pulmonary hypertension with preserved right ventricular size and function.  The patient has very frequent ED visits in the last 2-3 years approximately 10 for various reasons.  Home Medications  Prior to Admission medications   Medication Sig Start Date End Date Taking? Authorizing Provider  Alogliptin-Metformin HCl (KAZANO) 12.10-998 MG TABS Take 1 tablet by mouth 2 (two) times daily.   Yes Historical Provider, MD  amLODipine (NORVASC) 10 MG tablet Take 5 mg by mouth every morning.    Yes Historical Provider, MD  nitroGLYCERIN (NITROSTAT) 0.4 MG SL tablet Place 0.4 mg under the tongue every 5 (five) minutes as needed for chest pain.   Yes Historical Provider, MD  ondansetron (ZOFRAN) 4 MG tablet Take 1 tablet (4 mg total) by mouth every 6 (six) hours. 06/24/13  Yes Delice Bison Ward, DO    Family History  Family History  Problem Relation Age of Onset  . Cancer Other   . Hyperlipidemia Other   . Hypertension Other   . Asthma Other     Social History  History   Social History  . Marital Status: Single    Spouse Name: [name]    Number of Children: [name]  . Years of Education: [name]   Occupational History  . Not on file.   Social History Main Topics  . Smoking status: Never Smoker   . Smokeless tobacco: Never Used  . Alcohol Use: No  . Drug Use: No  . Sexual Activity: Yes  Birth Control/ Protection: Pill   Other Topics Concern  . Not on file   Social History Narrative  . No narrative on file     Review of Systems, as per HPI, otherwise negative General:  No chills, fever, night sweats or weight changes.  Cardiovascular:  No chest pain, dyspnea on exertion, edema, orthopnea, palpitations, paroxysmal nocturnal dyspnea. Dermatological: No rash, lesions/masses Respiratory: No cough, dyspnea Urologic: No hematuria, dysuria Abdominal:   No nausea, vomiting, diarrhea, bright red blood per rectum, melena, or  hematemesis Neurologic:  No visual changes, wkns, changes in mental status. All other systems reviewed and are otherwise negative except as noted above.  Physical Exam  There were no vitals taken for this visit.  General: Pleasant, NAD Psych: Normal affect. Neuro: Alert and oriented X 3. Moves all extremities spontaneously. HEENT: Normal  Neck: Supple without bruits, JVD + 8 cm Lungs:  Resp regular and unlabored, CTA. Heart: RRR no s3, s4, harsh 5/6 holosystolic murmur at the LLSB. Abdomen: Soft, non-tender, non-distended, BS + x 4.  Extremities: No clubbing, cyanosis or edema. DP/PT/Radials 2+ and equal bilaterally.  Labs:  No results found for this basename: CKTOTAL, CKMB, TROPONINI,  in the last 72 hours Lab Results  Component Value Date   WBC 4.4 06/06/2013   HGB 14.4 06/06/2013   HCT 41.5 06/06/2013   MCV 88.1 06/06/2013   PLT 212 06/06/2013   No results found for this basename: NA, K, CL, CO2, BUN, CREATININE, CALCIUM, LABALBU, PROT, BILITOT, ALKPHOS, ALT, AST, GLUCOSE,  in the last 168 hours Lab Results  Component Value Date   CHOL 149 12/08/2012   HDL 41 12/08/2012   LDLCALC 97 12/08/2012   TRIG 57 12/08/2012   Lab Results  Component Value Date   DDIMER <0.27 05/19/2013   Accessory Clinical Findings  Echocardiogram 12/08/2013  - Left ventricle: The cavity size was mildly dilated. There was mild concentric hypertrophy. Systolic function was normal. The estimated ejection fraction was in the range of 60% to 65%. Wall motion was normal; there were no regional wall motion abnormalities. Doppler parameters are consistent with abnormal left ventricular relaxation (grade 1 diastolic dysfunction). - Ventricular septum: There was a congenital defect in the perimembranous region. - Aortic valve: Trivial regurgitation. - Tricuspid valve: Severe regurgitation. - Pulmonary arteries: Systolic pressure was severely increased.  Lexiscan nuclear stress test  12/09/2012  Findings: QGS ejection fraction measures 62% , with an end- diastolic volume of 123XX123 ml and an end-systolic volume of 41 ml.  IMPRESSION: No evidence for pharmacologically induced ischemia.  Normal wall motion and calculated ejection fraction is 62%.  ECG - 05/19/13 SR, LVH, early repolarization in the inferolateral leads    Assessment & Plan  A 43 year old female with   1. Congenital membranous ventricular septal defect. The patient has progressively worsening symptoms of palpitations associated with dizziness and shortness of breath as well as dyspnea on exertion. The last echocardiogram in June of 2014 showed severe pulmonary hypertension. We'll order a transthoracic echocardiogram to evaluate the size and hemodynamic significance of the VSD as well as intracardiac left and right-sided pressures. We will most most probably end up doing right-sided cath to evaluate the reversibility of pulmonary hypertension and potential closure of the VSD. I will also order a e-cardio monitoring to evaluate palpitations. Because patient is allergic to patches on a Holter monitor, we'll order a special type of e cardia monitor that doesn't require any patches.  2. Hypertension - controlled on current regimen  Followup in 1 months.  Ena Dawley, Lemmie Evens, MD, [facility] 08/06/2013, 10:04 AM

## 2013-08-06 NOTE — Patient Instructions (Signed)
**Note De-Identified  Obfuscation** Your physician has requested that you have an echocardiogram. Echocardiography is a painless test that uses sound waves to create images of your heart. It provides your doctor with information about the size and shape of your heart and how well your heart's chambers and valves are working. This procedure takes approximately one hour. There are no restrictions for this procedure.  Your physician has recommended that you wear an event monitor for 2 weeks. Event monitors are medical devices that record the heart's electrical activity. Doctors most often [name] these monitors to diagnose arrhythmias. Arrhythmias are problems with the speed or rhythm of the heartbeat. The monitor is a small, portable device. You can wear one while you do your normal daily activities. This is usually used to diagnose what is causing palpitations/syncope (passing out).  Your physician recommends that you schedule a follow-up appointment in: 1 month

## 2013-08-07 ENCOUNTER — Other Ambulatory Visit (HOSPITAL_COMMUNITY): Payer: Medicaid Other

## 2013-08-19 ENCOUNTER — Encounter: Payer: Self-pay | Admitting: Radiology

## 2013-08-19 NOTE — Progress Notes (Signed)
[name], female   DOB: 1971-01-22, 43 y.o.   MRN: [id number] Patients monitor was canceled after [facility] made multiple attempts to reach pt with no response.

## 2013-08-20 ENCOUNTER — Encounter (INDEPENDENT_AMBULATORY_CARE_PROVIDER_SITE_OTHER): Payer: Medicaid Other

## 2013-08-20 ENCOUNTER — Encounter: Payer: Self-pay | Admitting: Cardiology

## 2013-08-20 DIAGNOSIS — R000 Tachycardia, unspecified: Secondary | ICD-10-CM

## 2013-08-20 DIAGNOSIS — R55 Syncope and collapse: Secondary | ICD-10-CM

## 2013-08-23 ENCOUNTER — Ambulatory Visit (HOSPITAL_COMMUNITY): Payer: Medicaid Other | Attending: Cardiovascular Disease | Admitting: Radiology

## 2013-08-23 ENCOUNTER — Encounter: Payer: Self-pay | Admitting: Cardiovascular Disease

## 2013-08-23 DIAGNOSIS — E669 Obesity, unspecified: Secondary | ICD-10-CM | POA: Insufficient documentation

## 2013-08-23 DIAGNOSIS — I379 Nonrheumatic pulmonary valve disorder, unspecified: Secondary | ICD-10-CM | POA: Insufficient documentation

## 2013-08-23 DIAGNOSIS — R0989 Other specified symptoms and signs involving the circulatory and respiratory systems: Secondary | ICD-10-CM | POA: Insufficient documentation

## 2013-08-23 DIAGNOSIS — R0609 Other forms of dyspnea: Secondary | ICD-10-CM | POA: Insufficient documentation

## 2013-08-23 DIAGNOSIS — I079 Rheumatic tricuspid valve disease, unspecified: Secondary | ICD-10-CM | POA: Insufficient documentation

## 2013-08-23 DIAGNOSIS — R079 Chest pain, unspecified: Secondary | ICD-10-CM | POA: Insufficient documentation

## 2013-08-23 DIAGNOSIS — Q210 Ventricular septal defect: Secondary | ICD-10-CM

## 2013-08-23 DIAGNOSIS — I510 Cardiac septal defect, acquired: Secondary | ICD-10-CM | POA: Insufficient documentation

## 2013-08-23 DIAGNOSIS — R002 Palpitations: Secondary | ICD-10-CM | POA: Insufficient documentation

## 2013-08-23 DIAGNOSIS — Q2542 Hypoplasia of aorta: Secondary | ICD-10-CM

## 2013-08-23 DIAGNOSIS — R0602 Shortness of breath: Secondary | ICD-10-CM | POA: Insufficient documentation

## 2013-08-23 DIAGNOSIS — I10 Essential (primary) hypertension: Secondary | ICD-10-CM | POA: Insufficient documentation

## 2013-08-23 DIAGNOSIS — R011 Cardiac murmur, unspecified: Secondary | ICD-10-CM

## 2013-08-23 NOTE — Progress Notes (Signed)
Echocardiogram performed.  

## 2013-09-03 ENCOUNTER — Ambulatory Visit (INDEPENDENT_AMBULATORY_CARE_PROVIDER_SITE_OTHER): Payer: Medicaid Other | Admitting: Cardiology

## 2013-09-03 ENCOUNTER — Encounter: Payer: Self-pay | Admitting: *Deleted

## 2013-09-03 ENCOUNTER — Encounter: Payer: Self-pay | Admitting: Cardiology

## 2013-09-03 VITALS — BP 98/60 | HR 80 | Ht 59.0 in | Wt 163.0 lb

## 2013-09-03 DIAGNOSIS — I272 Pulmonary hypertension, unspecified: Secondary | ICD-10-CM

## 2013-09-03 DIAGNOSIS — Q210 Ventricular septal defect: Secondary | ICD-10-CM

## 2013-09-03 DIAGNOSIS — Z01810 Encounter for preprocedural cardiovascular examination: Secondary | ICD-10-CM

## 2013-09-03 DIAGNOSIS — R079 Chest pain, unspecified: Secondary | ICD-10-CM

## 2013-09-03 DIAGNOSIS — I2789 Other specified pulmonary heart diseases: Secondary | ICD-10-CM

## 2013-09-03 LAB — CBC WITH DIFFERENTIAL/PLATELET
Basophils Absolute: 0 10*3/uL (ref 0.0–0.1)
Basophils Relative: 0.4 % (ref 0.0–3.0)
Eosinophils Absolute: 0.1 10*3/uL (ref 0.0–0.7)
Eosinophils Relative: 1.3 % (ref 0.0–5.0)
HCT: 38.7 % (ref 36.0–46.0)
Hemoglobin: 13.2 g/dL (ref 12.0–15.0)
Lymphocytes Relative: 31.3 % (ref 12.0–46.0)
Lymphs Abs: 1.9 10*3/uL (ref 0.7–4.0)
MCHC: 34.1 g/dL (ref 30.0–36.0)
MCV: 91.8 fl (ref 78.0–100.0)
Monocytes Absolute: 0.5 10*3/uL (ref 0.1–1.0)
Monocytes Relative: 7.9 % (ref 3.0–12.0)
Neutro Abs: 3.6 10*3/uL (ref 1.4–7.7)
Neutrophils Relative %: 59.1 % (ref 43.0–77.0)
Platelets: 270 10*3/uL (ref 150.0–400.0)
RBC: 4.21 Mil/uL (ref 3.87–5.11)
RDW: 12.9 % (ref 11.5–14.6)
WBC: 6.1 10*3/uL (ref 4.5–10.5)

## 2013-09-03 LAB — BASIC METABOLIC PANEL
BUN: 10 mg/dL (ref 6–23)
CO2: 26 mEq/L (ref 19–32)
Calcium: 9.5 mg/dL (ref 8.4–10.5)
Chloride: 106 mEq/L (ref 96–112)
Creatinine, Ser: 0.8 mg/dL (ref 0.4–1.2)
GFR: 109.22 mL/min (ref >60.00)
Glucose, Bld: 70 mg/dL (ref 70–99)
Potassium: 4 mEq/L (ref 3.5–5.1)
Sodium: 138 mEq/L (ref 135–145)

## 2013-09-03 LAB — PROTIME-INR
INR: 1.1 ratio — ABNORMAL HIGH (ref 0.8–1.0)
Prothrombin Time: 11.4 s (ref 10.2–12.4)

## 2013-09-03 NOTE — Progress Notes (Signed)
[name], female   DOB: 04-May-1971, 43 y.o.   MRN: [id number] Patient returned 30 day cardiac event monitor to [facility] [place].  Monitor mailed to [place] from our office 09/03/2013.

## 2013-09-03 NOTE — Patient Instructions (Addendum)
Your physician has requested that you have a cardiac catheterization. Cardiac catheterization is used to diagnose and/or treat various heart conditions. Doctors may recommend this procedure for a number of different reasons. The most common reason is to evaluate chest pain. Chest pain can be a symptom of coronary artery disease (CAD), and cardiac catheterization can show whether plaque is narrowing or blocking your heart's arteries. This procedure is also used to evaluate the valves, as well as measure the blood flow and oxygen levels in different parts of your heart. For further information please visit [email or web address]. Please follow instruction sheet, as given.  Your physician recommends that you return for lab work in: today   Your physician recommends that you schedule a follow-up appointment in: after Catheterization

## 2013-09-03 NOTE — Progress Notes (Signed)
[name], female   DOB: [birth year], 43 y.o.   MRN: [id number]    Patient Name: Mackenzie Patterson Date of Encounter: 09/03/2013  Primary Care Provider:  [name], MD Primary Cardiologist:  [name]  Problem List   Past Medical History  Diagnosis Date  . Obesity   . Heart murmur   . Borderline diabetes mellitus   . Congenital ventricular septal defect   . Asthma   . Diabetes mellitus without complication    Past Surgical History  Procedure Laterality Date  . Tubal ligation    . Cyst excision     Allergies  Allergies  Allergen Reactions  . Penicillins Anaphylaxis  . 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 The patient is not a good historian and is not sure if she was followed by cardiologists in the past and states that her primary care physician is her heart doctor. She states that 2 years ago  She was referred to [facility] where she was offered an option of surgical closure of her VSD or medical management and she decided not to undergo surgery.  Her main complaint today is "heart fluttering" that starts after moderate activity and it can last up to 20-30 minutes. These palpitations are associated with lightheadedness, chest pain and shortness of breath and are happening on a daily basis. Patient also states that she usually sleeps on 2 pillows but recently she has been waking up at night feeling short of breath and upright position helps. She also states that she is getting progressively worsening dyspnea on exertion like walking to the mailbox that is about 50 feet from her house will make her short of breath.   In June of 2014 she had a syncopal episode associated with chest pain and underwent a Lexiscan  nuclear stress test that showed normal left ventricular ejection fraction fraction, no scar or  ischemia. An echocardiogram at that time showed mildly dilated left ventricle with preserved ejection fraction, membranous VSD not further specified and severe pulmonary hypertension with preserved right ventricular size and function.  The patient has very frequent ED visits in the last 2-3 years approximately 10 x for multiple various reasons.  Home Medications  Prior to Admission medications   Medication Sig Start Date End Date Taking? Authorizing Provider  Alogliptin-Metformin HCl (KAZANO) 12.10-998 MG TABS Take 1 tablet by mouth 2 (two) times daily.   Yes Historical Provider, MD  amLODipine (NORVASC) 10 MG tablet Take 5 mg by mouth every morning.    Yes Historical Provider, MD  nitroGLYCERIN (NITROSTAT) 0.4 MG SL tablet Place 0.4 mg under the tongue every 5 (five) minutes as needed for chest pain.   Yes Historical Provider, MD  ondansetron (ZOFRAN) 4 MG tablet Take 1 tablet (4 mg total) by mouth every 6 (six) hours. 06/24/13  Yes Delice Bison Ward, DO    Family History  Family History  Problem Relation Age of Onset  . Cancer Other   . Hyperlipidemia Other   . Hypertension Other   . Asthma Other     Social History  History   Social History  . Marital Status: Single    Spouse Name: [name]    Number of Children: [name]  . Years of Education: [name]   Occupational History  . Not on file.   Social History Main Topics  .  Smoking status: Never Smoker   . Smokeless tobacco: Never Used  . Alcohol Use: No  . Drug Use: No  . Sexual Activity: Yes    Birth Control/ Protection: Pill   Other Topics Concern  . Not on file   Social History Narrative  . No narrative on file     Review of Systems, as per HPI, otherwise negative General:  No chills, fever, night sweats or weight changes.  Cardiovascular:  No chest pain, dyspnea on exertion, edema, orthopnea, palpitations, paroxysmal nocturnal dyspnea. Dermatological: No rash, lesions/masses Respiratory: No cough, dyspnea Urologic: No  hematuria, dysuria Abdominal:   No nausea, vomiting, diarrhea, bright red blood per rectum, melena, or hematemesis Neurologic:  No visual changes, wkns, changes in mental status. All other systems reviewed and are otherwise negative except as noted above.  Physical Exam  Blood pressure 98/60, pulse 80, height 4\' 11"  (1.499 m), weight 163 lb (73.936 kg).  General: Pleasant, NAD Psych: Normal affect. Neuro: Alert and oriented X 3. Moves all extremities spontaneously. HEENT: Normal  Neck: Supple without bruits, JVD + 8 cm Lungs:  Resp regular and unlabored, CTA. Heart: RRR no s3, s4, harsh 5/6 holosystolic murmur at the LLSB. Abdomen: Soft, non-tender, non-distended, BS + x 4.  Extremities: No clubbing, cyanosis or edema. DP/PT/Radials 2+ and equal bilaterally.  Labs:  No results found for this basename: CKTOTAL, CKMB, TROPONINI,  in the last 72 hours Lab Results  Component Value Date   WBC 4.4 06/06/2013   HGB 14.4 06/06/2013   HCT 41.5 06/06/2013   MCV 88.1 06/06/2013   PLT 212 06/06/2013   No results found for this basename: NA, K, CL, CO2, BUN, CREATININE, CALCIUM, LABALBU, PROT, BILITOT, ALKPHOS, ALT, AST, GLUCOSE,  in the last 168 hours Lab Results  Component Value Date   CHOL 149 12/08/2012   HDL 41 12/08/2012   LDLCALC 97 12/08/2012   TRIG 57 12/08/2012   Lab Results  Component Value Date   DDIMER <0.27 05/19/2013   Accessory Clinical Findings  Echocardiogram 08/23/2013  - Left ventricle: The cavity size was mildly dilated. Wall thickness was normal. Systolic function was normal. The estimated ejection fraction was in the range of 60% to 65%. - Left atrium: The atrium was mildly dilated. - Atrial septum: No defect or patent foramen ovale was identified. - Tricuspid valve: Degree of TR and estimation of PA pressure difficult due to contamination of TR jet from VSD Mild-moderate regurgitation. - Pulmonary arteries: PA peak pressure: 41mm Hg (S). QP/QS 2.2 -  Pericardium, extracardiac: A trivial pericardial effusion was identified posterior to the heart. - Impressions: Some what large peri membranous VSD. Suggest QP/QS and right heart cath at some point. No RV/RA dilatation TR velocity contaminated by VSD flow so hard to estimate PA pressure. Elevated EDP by E/E; ratio Impressions:  - Some what large peri membranous VSD. Suggest QP/QS and right heart cath at some point. No RV/RA dilatation TR velocity contaminated by VSD flow so hard to estimate PA pressure. Elevated EDP by E/E; ratio   Lexiscan nuclear stress test 12/09/2012  Findings: QGS ejection fraction measures 62% , with an end- diastolic volume of 324 ml and an end-systolic volume of 41 ml.  IMPRESSION: No evidence for pharmacologically induced ischemia.  Normal wall motion and calculated ejection fraction is 62%.  ECG - 05/19/13 SR, LVH, early repolarization in the inferolateral leads    Assessment & Plan  A 43 year old female with   1. Congenital membranous ventricular septal  defect. The patient has progressively worsening symptoms of palpitations associated with dizziness and shortness of breath as well as dyspnea on exertion. The last echocardiogram in February 2015 showed severe pulmonary hypertension  With preserved RV size and function, Qp/Qs 2.2, elevated filling pressures.   We will order right and left cardiac catheterization to evaluate for reversibility of pulmonary hypertension, right and left-sided pressures and coronary arteries in case she will undergo VSD repair.  2. Palpitations - the results from a cardia monitor are still pending.   3. Hypertension - controlled on current regimen  4. Syncope - possibly related to severe pulmonary hypertension  Followup after cardiac cath.  [name], MD, [facility] 09/03/2013, 10:00 AM

## 2013-09-03 NOTE — Addendum Note (Signed)
Addended by: [name] on: 09/03/2013 01:28 PM   Modules accepted: Orders

## 2013-09-06 ENCOUNTER — Encounter (HOSPITAL_COMMUNITY): Payer: Self-pay | Admitting: Pharmacy Technician

## 2013-09-11 ENCOUNTER — Ambulatory Visit (HOSPITAL_COMMUNITY)
Admission: RE | Admit: 2013-09-11 | Discharge: 2013-09-11 | Disposition: A | Discharge status: Home / Self Care | Payer: Medicaid Other | Source: Ambulatory Visit | Attending: Cardiovascular Disease | Admitting: Cardiovascular Disease

## 2013-09-11 ENCOUNTER — Encounter (HOSPITAL_COMMUNITY)
Admission: RE | Disposition: A | Discharge status: Home / Self Care | Payer: Self-pay | Source: Ambulatory Visit | Attending: Cardiovascular Disease

## 2013-09-11 DIAGNOSIS — Q210 Ventricular septal defect: Secondary | ICD-10-CM

## 2013-09-11 DIAGNOSIS — I10 Essential (primary) hypertension: Secondary | ICD-10-CM | POA: Insufficient documentation

## 2013-09-11 DIAGNOSIS — E669 Obesity, unspecified: Secondary | ICD-10-CM | POA: Insufficient documentation

## 2013-09-11 DIAGNOSIS — R011 Cardiac murmur, unspecified: Secondary | ICD-10-CM | POA: Insufficient documentation

## 2013-09-11 DIAGNOSIS — J45909 Unspecified asthma, uncomplicated: Secondary | ICD-10-CM | POA: Insufficient documentation

## 2013-09-11 DIAGNOSIS — E119 Type 2 diabetes mellitus without complications: Secondary | ICD-10-CM | POA: Insufficient documentation

## 2013-09-11 HISTORY — PX: LEFT AND RIGHT HEART CATHETERIZATION WITH CORONARY ANGIOGRAM: SHX5449

## 2013-09-11 LAB — POCT I-STAT 3, ART BLOOD GAS (G3+)
ACID-BASE DEFICIT: 3 mmol/L — AB (ref 0.0–2.0)
BICARBONATE: 22.8 meq/L (ref 20.0–24.0)
O2 SAT: 91 %
PO2 ART: 65 mmHg — AB (ref 80.0–100.0)
TCO2: 24 mmol/L (ref 0–100)
pCO2 arterial: 40.5 mmHg (ref 35.0–45.0)
pH, Arterial: 7.358 (ref 7.350–7.450)

## 2013-09-11 LAB — POCT I-STAT 3, VENOUS BLOOD GAS (G3P V)
ACID-BASE DEFICIT: 2 mmol/L (ref 0.0–2.0)
Acid-base deficit: 2 mmol/L (ref 0.0–2.0)
Acid-base deficit: 2 mmol/L (ref 0.0–2.0)
Acid-base deficit: 3 mmol/L — ABNORMAL HIGH (ref 0.0–2.0)
BICARBONATE: 23.1 meq/L (ref 20.0–24.0)
Bicarbonate: 22.3 mEq/L (ref 20.0–24.0)
Bicarbonate: 23.1 mEq/L (ref 20.0–24.0)
Bicarbonate: 23.5 mEq/L (ref 20.0–24.0)
O2 SAT: 79 %
O2 SAT: 84 %
O2 Saturation: 72 %
O2 Saturation: 80 %
PH VEN: 7.334 — AB (ref 7.250–7.300)
PH VEN: 7.355 — AB (ref 7.250–7.300)
PH VEN: 7.357 — AB (ref 7.250–7.300)
PO2 VEN: 46 mmHg — AB (ref 30.0–45.0)
TCO2: 23 mmol/L (ref 0–100)
TCO2: 24 mmol/L (ref 0–100)
TCO2: 24 mmol/L (ref 0–100)
TCO2: 25 mmol/L (ref 0–100)
pCO2, Ven: 39.8 mmHg — ABNORMAL LOW (ref 45.0–50.0)
pCO2, Ven: 41.1 mmHg — ABNORMAL LOW (ref 45.0–50.0)
pCO2, Ven: 41.2 mmHg — ABNORMAL LOW (ref 45.0–50.0)
pCO2, Ven: 44.1 mmHg — ABNORMAL LOW (ref 45.0–50.0)
pH, Ven: 7.357 — ABNORMAL HIGH (ref 7.250–7.300)
pO2, Ven: 41 mmHg (ref 30.0–45.0)
pO2, Ven: 45 mmHg (ref 30.0–45.0)
pO2, Ven: 50 mmHg — ABNORMAL HIGH (ref 30.0–45.0)

## 2013-09-11 LAB — GLUCOSE, CAPILLARY: Glucose-Capillary: 73 mg/dL (ref 70–99)

## 2013-09-11 SURGERY — LEFT AND RIGHT HEART CATHETERIZATION WITH CORONARY ANGIOGRAM
Anesthesia: LOCAL

## 2013-09-11 MED ORDER — ASPIRIN 81 MG PO CHEW
81.0000 mg | CHEWABLE_TABLET | ORAL | Status: AC
  Administered 2013-09-11: 81 mg via ORAL
  Filled 2013-09-11: qty 1

## 2013-09-11 MED ORDER — FENTANYL CITRATE 0.05 MG/ML IJ SOLN
INTRAMUSCULAR | Status: AC
  Filled 2013-09-11: qty 2

## 2013-09-11 MED ORDER — SODIUM CHLORIDE 0.9 % IV SOLN: 1.0000 mL/kg/h | INTRAVENOUS | Status: DC

## 2013-09-11 MED ORDER — SODIUM CHLORIDE 0.9 % IV SOLN
INTRAVENOUS | Status: DC
  Administered 2013-09-11: 1000 mL via INTRAVENOUS

## 2013-09-11 MED ORDER — ACETAMINOPHEN 325 MG PO TABS: 650.0000 mg | ORAL_TABLET | ORAL | Status: DC | PRN

## 2013-09-11 MED ORDER — MIDAZOLAM HCL 2 MG/2ML IJ SOLN
INTRAMUSCULAR | Status: AC
  Filled 2013-09-11: qty 2

## 2013-09-11 MED ORDER — ONDANSETRON HCL 4 MG/2ML IJ SOLN: 4.0000 mg | Freq: Four times a day (QID) | INTRAMUSCULAR | Status: DC | PRN

## 2013-09-11 MED ORDER — SODIUM CHLORIDE 0.9 % IJ SOLN: 3.0000 mL | INTRAMUSCULAR | Status: DC | PRN

## 2013-09-11 MED ORDER — MIDAZOLAM HCL 2 MG/2ML IJ SOLN
INTRAMUSCULAR | Status: AC
Start: 2013-09-11 — End: 2013-09-11
  Filled 2013-09-11: qty 2

## 2013-09-11 MED ORDER — NITROGLYCERIN 0.2 MG/ML ON CALL CATH LAB
INTRAVENOUS | Status: AC
  Filled 2013-09-11: qty 1

## 2013-09-11 MED ORDER — HYDROCODONE-ACETAMINOPHEN 5-325 MG PO TABS
ORAL_TABLET | ORAL | Status: AC
  Filled 2013-09-11: qty 1

## 2013-09-11 MED ORDER — LIDOCAINE HCL (PF) 1 % IJ SOLN
INTRAMUSCULAR | Status: AC
  Filled 2013-09-11: qty 30

## 2013-09-11 MED ORDER — SODIUM CHLORIDE 0.9 % IJ SOLN: 3.0000 mL | Freq: Two times a day (BID) | INTRAMUSCULAR | Status: DC

## 2013-09-11 MED ORDER — HYDROCODONE-ACETAMINOPHEN 5-325 MG PO TABS
1.0000 | ORAL_TABLET | Freq: Once | ORAL | Status: AC
  Administered 2013-09-11: 1 via ORAL

## 2013-09-11 MED ORDER — ONDANSETRON HCL 4 MG/2ML IJ SOLN
INTRAMUSCULAR | Status: AC
  Filled 2013-09-11: qty 2

## 2013-09-11 MED ORDER — HEPARIN (PORCINE) IN NACL 2-0.9 UNIT/ML-% IJ SOLN
INTRAMUSCULAR | Status: AC
  Filled 2013-09-11: qty 1000

## 2013-09-11 MED ORDER — SODIUM CHLORIDE 0.9 % IV SOLN: 250.0000 mL | INTRAVENOUS | Status: DC | PRN

## 2013-09-11 NOTE — Discharge Instructions (Signed)
Angiography, Care After °Refer to this sheet in the next few weeks. These instructions provide you with information on caring for yourself after your procedure. Your health care provider may also give you more specific instructions. Your treatment has been planned according to current medical practices, but problems sometimes occur. Call your health care provider if you have any problems or questions after your procedure.  °WHAT TO EXPECT AFTER THE PROCEDURE °After your procedure, it is typical to have the following sensations: °· Minor discomfort or tenderness and a small bump at the catheter insertion site. The bump should usually decrease in size and tenderness within 1 to 2 weeks. °· Any bruising will usually fade within 2 to 4 weeks. °HOME CARE INSTRUCTIONS  °· You may need to keep taking blood thinners if they were prescribed for you. Only take over-the-counter or prescription medicines for pain, fever, or discomfort as directed by your health care provider. °· Do not apply powder or lotion to the site. °· Do not sit in a bathtub, swimming pool, or whirlpool for 5 to 7 days. °· You may shower 24 hours after the procedure. Remove the bandage (dressing) and gently wash the site with plain soap and water. Gently pat the site dry. °· Inspect the site at least twice daily. °· Limit your activity for the first 24 hours. Do not bend, squat, or lift anything over 10 lb or as directed by your health care provider. °· Do not drive home if you are discharged the day of the procedure. Have someone else drive you. Follow instructions about when you can drive or return to work. °SEEK MEDICAL CARE IF: °· You get lightheaded when standing up. °· You have drainage (other than a small amount of blood on the dressing). °· You have chills. °· You have a fever. °· You have redness, warmth, swelling, or pain at the insertion site. °SEEK IMMEDIATE MEDICAL CARE IF:  °· You develop chest pain or shortness of breath, feel faint, or  pass out. °· You have bleeding, swelling larger than a walnut, or drainage from the catheter insertion site. °· You develop pain, discoloration, coldness, or severe bruising in the leg or arm that held the catheter. °· You develop bleeding from any other place, such as the bowels. You may see bright red blood in your urine or stools, or your stools may appear black and tarry. °· You have heavy bleeding from the site. If this happens, hold pressure on the site. °MAKE SURE YOU: °· Understand these instructions. °· Will watch your condition. °· Will get help right away if you are not doing well or get worse. °Document Released: 12/30/2004 Document Revised: 02/13/2013 Document Reviewed: 11/05/2012 °Exit[employer]® Patient Information ©2014 [employer]. ° °

## 2013-09-11 NOTE — Progress Notes (Signed)
[place] advised patient has jewelry in piercings in both breasts.  No further orders.

## 2013-09-11 NOTE — Interval H&P Note (Signed)
History and Physical Interval Note:  09/11/2013 8:33 AM  Mackenzie Patterson  has presented today for surgery, with the diagnosis of hp/vsv  The various methods of treatment have been discussed with the patient and family. After consideration of risks, benefits and other options for treatment, the patient has consented to  Procedure(s): LEFT AND RIGHT HEART CATHETERIZATION WITH CORONARY ANGIOGRAM ([name]) as a surgical intervention .  The patient's history has been reviewed, patient examined, no change in status, stable for surgery.  I have reviewed the patient's chart and labs.  Questions were answered to the patient's satisfaction.    Cath Lab Visit (complete for each Cath Lab visit)  Clinical Evaluation Leading to the Procedure:   ACS: no  Non-ACS:    Anginal Classification: CCS II  Anti-ischemic medical therapy: Minimal Therapy (1 class of medications)  Non-Invasive Test Results: No non-invasive testing performed  Prior CABG: No previous CABG  Right and left heart cath for evaluation of VSD. Pt with frequent chest pain and marked limitation from exertional dyspnea.   Sherren Mocha 09/11/2013 8:35 AM        Sherren Mocha

## 2013-09-11 NOTE — H&P (View-Only) (Signed)
Patient ID: [name], female   DOB: 1971/02/06, 43 y.o.   MRN: [id number]    Patient Name: Mackenzie Patterson Date of Encounter: 09/03/2013  Primary Care Provider:  [name], MD Primary Cardiologist:  [name]  Problem List   Past Medical History  Diagnosis Date  . Obesity   . Heart murmur   . Borderline diabetes mellitus   . Congenital ventricular septal defect   . Asthma   . Diabetes mellitus without complication    Past Surgical History  Procedure Laterality Date  . Tubal ligation    . Cyst excision     Allergies  Allergies  Allergen Reactions  . Penicillins Anaphylaxis  . 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 The patient is not a good historian and is not sure if she was followed by cardiologists in the past and states that her primary care physician is her heart doctor. She states that 2 years ago  She was referred to [facility] where she was offered an option of surgical closure of her VSD or medical management and she decided not to undergo surgery.  Her main complaint today is "heart fluttering" that starts after moderate activity and it can last up to 20-30 minutes. These palpitations are associated with lightheadedness, chest pain and shortness of breath and are happening on a daily basis. Patient also states that she usually sleeps on 2 pillows but recently she has been waking up at night feeling short of breath and upright position helps. She also states that she is getting progressively worsening dyspnea on exertion like walking to the mailbox that is about 50 feet from her house will make her short of breath.   In June of 2014 she had a syncopal episode associated with chest pain and underwent a Lexiscan  nuclear stress test that showed normal left ventricular ejection fraction fraction, no scar or  ischemia. An echocardiogram at that time showed mildly dilated left ventricle with preserved ejection fraction, membranous VSD not further specified and severe pulmonary hypertension with preserved right ventricular size and function.  The patient has very frequent ED visits in the last 2-3 years approximately 10 x for multiple various reasons.  Home Medications  Prior to Admission medications   Medication Sig Start Date End Date Taking? Authorizing Provider  Alogliptin-Metformin HCl (KAZANO) 12.10-998 MG TABS Take 1 tablet by mouth 2 (two) times daily.   Yes Historical Provider, MD  amLODipine (NORVASC) 10 MG tablet Take 5 mg by mouth every morning.    Yes Historical Provider, MD  nitroGLYCERIN (NITROSTAT) 0.4 MG SL tablet Place 0.4 mg under the tongue every 5 (five) minutes as needed for chest pain.   Yes Historical Provider, MD  ondansetron (ZOFRAN) 4 MG tablet Take 1 tablet (4 mg total) by mouth every 6 (six) hours. 06/24/13  Yes Delice Bison Ward, DO    Family History  Family History  Problem Relation Age of Onset  . Cancer Other   . Hyperlipidemia Other   . Hypertension Other   . Asthma Other     Social History  History   Social History  . Marital Status: Single    Spouse Name: [name]    Number of Children: [name]  . Years of Education: [name]   Occupational History  . Not on file.   Social History Main Topics  .  Smoking status: Never Smoker   . Smokeless tobacco: Never Used  . Alcohol Use: No  . Drug Use: No  . Sexual Activity: Yes    Birth Control/ Protection: Pill   Other Topics Concern  . Not on file   Social History Narrative  . No narrative on file     Review of Systems, as per HPI, otherwise negative General:  No chills, fever, night sweats or weight changes.  Cardiovascular:  No chest pain, dyspnea on exertion, edema, orthopnea, palpitations, paroxysmal nocturnal dyspnea. Dermatological: No rash, lesions/masses Respiratory: No cough, dyspnea Urologic: No  hematuria, dysuria Abdominal:   No nausea, vomiting, diarrhea, bright red blood per rectum, melena, or hematemesis Neurologic:  No visual changes, wkns, changes in mental status. All other systems reviewed and are otherwise negative except as noted above.  Physical Exam  Blood pressure 98/60, pulse 80, height 4\' 11"  (1.499 m), weight 163 lb (73.936 kg).  General: Pleasant, NAD Psych: Normal affect. Neuro: Alert and oriented X 3. Moves all extremities spontaneously. HEENT: Normal  Neck: Supple without bruits, JVD + 8 cm Lungs:  Resp regular and unlabored, CTA. Heart: RRR no s3, s4, harsh 5/6 holosystolic murmur at the LLSB. Abdomen: Soft, non-tender, non-distended, BS + x 4.  Extremities: No clubbing, cyanosis or edema. DP/PT/Radials 2+ and equal bilaterally.  Labs:  No results found for this basename: CKTOTAL, CKMB, TROPONINI,  in the last 72 hours Lab Results  Component Value Date   WBC 4.4 06/06/2013   HGB 14.4 06/06/2013   HCT 41.5 06/06/2013   MCV 88.1 06/06/2013   PLT 212 06/06/2013   No results found for this basename: NA, K, CL, CO2, BUN, CREATININE, CALCIUM, LABALBU, PROT, BILITOT, ALKPHOS, ALT, AST, GLUCOSE,  in the last 168 hours Lab Results  Component Value Date   CHOL 149 12/08/2012   HDL 41 12/08/2012   LDLCALC 97 12/08/2012   TRIG 57 12/08/2012   Lab Results  Component Value Date   DDIMER <0.27 05/19/2013   Accessory Clinical Findings  Echocardiogram 08/23/2013  - Left ventricle: The cavity size was mildly dilated. Wall thickness was normal. Systolic function was normal. The estimated ejection fraction was in the range of 60% to 65%. - Left atrium: The atrium was mildly dilated. - Atrial septum: No defect or patent foramen ovale was identified. - Tricuspid valve: Degree of TR and estimation of PA pressure difficult due to contamination of TR jet from VSD Mild-moderate regurgitation. - Pulmonary arteries: PA peak pressure: 41mm Hg (S). QP/QS 2.2 -  Pericardium, extracardiac: A trivial pericardial effusion was identified posterior to the heart. - Impressions: Some what large peri membranous VSD. Suggest QP/QS and right heart cath at some point. No RV/RA dilatation TR velocity contaminated by VSD flow so hard to estimate PA pressure. Elevated EDP by E/E; ratio Impressions:  - Some what large peri membranous VSD. Suggest QP/QS and right heart cath at some point. No RV/RA dilatation TR velocity contaminated by VSD flow so hard to estimate PA pressure. Elevated EDP by E/E; ratio   Lexiscan nuclear stress test 12/09/2012  Findings: QGS ejection fraction measures 62% , with an end- diastolic volume of 324 ml and an end-systolic volume of 41 ml.  IMPRESSION: No evidence for pharmacologically induced ischemia.  Normal wall motion and calculated ejection fraction is 62%.  ECG - 05/19/13 SR, LVH, early repolarization in the inferolateral leads    Assessment & Plan  A 43 year old female with   1. Congenital membranous ventricular septal  defect. The patient has progressively worsening symptoms of palpitations associated with dizziness and shortness of breath as well as dyspnea on exertion. The last echocardiogram in February 2015 showed severe pulmonary hypertension  With preserved RV size and function, Qp/Qs 2.2, elevated filling pressures.   We will order right and left cardiac catheterization to evaluate for reversibility of pulmonary hypertension, right and left-sided pressures and coronary arteries in case she will undergo VSD repair.  2. Palpitations - the results from a cardia monitor are still pending.   3. Hypertension - controlled on current regimen  4. Syncope - possibly related to severe pulmonary hypertension  Followup after cardiac cath.  [name], MD, [facility] 09/03/2013, 10:00 AM

## 2013-09-11 NOTE — CV Procedure (Signed)
   Cardiac Catheterization Procedure Note  Name: Mackenzie Patterson MRN: [id number] DOB: December 20, 1970  Procedure: Right Heart Cath, Left Heart Cath, Selective Coronary Angiography, LV angiography  Indication: VSD   Procedural Details: The right groin was prepped, draped, and anesthetized with 1% lidocaine. Using the modified Seldinger technique a 4 French sheath was placed in the right femoral artery and a 7 French sheath was placed in the right femoral vein. A Swan-Ganz catheter was used for the right heart catheterization. Standard protocol was followed for recording of right heart pressures and sampling of oxygen saturations. Fick cardiac output was calculated. Standard Judkins catheters were used for selective coronary angiography and left ventriculography. There were no immediate procedural complications. The patient was transferred to the post catheterization recovery area for further monitoring.  Procedural Findings: Hemodynamics RA A wave 9, V wave 8, mean pressure of 7 RV 39/6 PA and 39/18 with a mean of 27 PCWP A wave 13, V wave 9, mean of 10 LV 122/11 AO 119/73 with a mean of 95  Oxygen saturations: PA 84% AO 91% SVC 72% IVC 79%  Qp: 17.8 L/m Qs: 10.4 L/m Qp:Qs - 1.7:1  Coronary angiography: Coronary dominance: right  Left mainstem: The left main stem is widely patent. Divides into the LAD and left circumflex.  Left anterior descending (LAD): The LAD is widely patent throughout. The vessel supplies 2 diagonal branches with no significant stenoses.  Left circumflex (LCx): The left circumflex is patent. There are 2 obtuse marginal branches with no stenosis.  Right coronary artery (RCA): The right coronary artery is codominant with the circumflex. There is no obstructive disease present.  Left ventriculography: Left ventricular systolic function is normal. The LVEF is 65%. Ventriculography was done in the LAO projection this demonstrates shunting from the left ventricle into  the right through a ventricular septal defect.  Final Conclusions:   1. Normal coronary arteries 2. Ventricular septal defect with QP:QS of 1.7 3. Normal right heart pressures with the exception of minimal pulmonary HTN (PVR less than 2 Woods units)  Sherren Mocha 09/11/2013, 9:28 AM

## 2013-09-17 ENCOUNTER — Telehealth: Payer: Self-pay

## 2013-09-17 NOTE — Telephone Encounter (Signed)
**Note De-Identified Kwane Rohl Obfuscation** Sent:     To:                         Larita Fife,    Could we refer her to Dr Tressie Stalker for a CT surgery consult for a consideration of VSD closure?    Could you lt her know?    She will also need to follow with [name] in 6 months.    Thank you,    Aris Lot            The pt is advised, she verbalized understanding. A message has been sent to [facility] to arrange consult with Dr Cornelius Moras. The pt is aware that someone will be contacting her from Dr Orvan July office to schedule appointment.                                               Patient Information     Patient Name Sex DOB SSN    Mackenzie Patterson, Mackenzie Patterson Female 11-Sep-1970 [id number]           CV Procedure by Tonny Bollman, MD at 09/11/2013 9:27 AM     Author: Tonny Bollman, MD Service: Cardiac Cath Author Type: Physician    Filed: 09/11/2013 9:44 AM Note Time: 09/11/2013 9:27 AM Status: Signed    Editor: Tonny Bollman, MD (Physician)        Cardiac Catheterization Procedure Note  Name: Mackenzie Patterson  MRN: [id number]  DOB: 08-15-1970  Procedure: Right Heart Cath, Left Heart Cath, Selective Coronary Angiography, LV angiography  Indication: VSD  Procedural Details: The right groin was prepped, draped, and anesthetized with 1% lidocaine. Using the modified Seldinger technique a 4 French sheath was placed in the right femoral artery and a 7 French sheath was placed in the right femoral vein. A Swan-Ganz catheter was used for the right heart catheterization. Standard protocol was followed for recording of right heart pressures and sampling of oxygen saturations. Fick cardiac output was calculated. Standard Judkins catheters were used for selective coronary angiography and left ventriculography. There were no immediate procedural complications. The patient was transferred to the post catheterization recovery area for further monitoring.  Procedural Findings:  Hemodynamics  RA A wave 9, V wave 8, mean  pressure of 7  RV 39/6  PA and 39/18 with a mean of 27  PCWP A wave 13, V wave 9, mean of 10  LV 122/11  AO 119/73 with a mean of 95  Oxygen saturations:  PA 84%  AO 91%  SVC 72%  IVC 79%  Qp: 17.8 L/m  Qs: 10.4 L/m  Qp:Qs - 1.7:1  Coronary angiography:  Coronary dominance: right  Left mainstem: The left main stem is widely patent. Divides into the LAD and left circumflex.  Left anterior descending (LAD): The LAD is widely patent throughout. The vessel supplies 2 diagonal branches with no significant stenoses.  Left circumflex (LCx): The left circumflex is patent. There are 2 obtuse marginal branches with no stenosis.  Right coronary artery (RCA): The right coronary artery is codominant with the circumflex. There is no obstructive disease present.  Left ventriculography: Left ventricular systolic function is normal. The LVEF is 65%. Ventriculography was done in the LAO projection this demonstrates shunting from the left ventricle into the right **Note De-Identified Novella Abraha Obfuscation** through a ventricular septal defect.  Final Conclusions:  1. Normal coronary arteries  2. Ventricular septal defect with QP:QS of 1.7  3. Normal right heart pressures with the exception of minimal pulmonary HTN (PVR less than 2 Woods units)  Tonny Bollman  09/11/2013, 9:28 AM

## 2013-09-19 ENCOUNTER — Other Ambulatory Visit: Payer: Self-pay | Admitting: *Deleted

## 2013-09-19 DIAGNOSIS — Q210 Ventricular septal defect: Secondary | ICD-10-CM

## 2013-10-10 ENCOUNTER — Institutional Professional Consult (permissible substitution) (INDEPENDENT_AMBULATORY_CARE_PROVIDER_SITE_OTHER): Payer: Medicaid Other | Admitting: Thoracic Surgery (Cardiothoracic Vascular Surgery)

## 2013-10-10 ENCOUNTER — Telehealth: Payer: Self-pay | Admitting: Cardiology

## 2013-10-10 ENCOUNTER — Encounter: Payer: Self-pay | Admitting: Thoracic Surgery (Cardiothoracic Vascular Surgery)

## 2013-10-10 VITALS — BP 111/78 | HR 83 | Resp 16 | Ht 59.0 in | Wt 162.0 lb

## 2013-10-10 DIAGNOSIS — I071 Rheumatic tricuspid insufficiency: Secondary | ICD-10-CM

## 2013-10-10 DIAGNOSIS — I2789 Other specified pulmonary heart diseases: Secondary | ICD-10-CM

## 2013-10-10 DIAGNOSIS — J45909 Unspecified asthma, uncomplicated: Secondary | ICD-10-CM

## 2013-10-10 DIAGNOSIS — I079 Rheumatic tricuspid valve disease, unspecified: Secondary | ICD-10-CM

## 2013-10-10 DIAGNOSIS — I272 Pulmonary hypertension, unspecified: Secondary | ICD-10-CM

## 2013-10-10 DIAGNOSIS — Q210 Ventricular septal defect: Secondary | ICD-10-CM

## 2013-10-10 NOTE — H&P (Signed)
[address]       [place] [address]             [phone]     CARDIOTHORACIC SURGERY CONSULTATION REPORT  Referring Provider is Mackenzie Spark, MD PCP is Mackenzie Nettle, MD  Chief Complaint  Patient presents with  . Shortness of Breath    eval congenital VSD for surgery.Marland KitchenECHO 08/21/13.Marland KitchenMarland KitchenCATH...09/11/13  . Dizziness  . 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 She was initially diagnosed at [facility] where she followed conservatively without surgical intervention until she turned 43 years old.  At one point elective surgical closure was discussed, but a decision was made to continue medical therapy without surgical closure.  Since she turned 19 she has been followed locally in [facility] by Dr. Montez Patterson.  She states that she first began to experience some symptoms of exertional shortness of breath and atypical chest pain during pregnancy with her twins who are now 42 years old. Over the years she has developed aggressive exertional shortness of breath with decreased exercise tolerance. These symptoms have gotten considerably worse recently, such that the patient get short of breath with relatively mild activity and occasionally at rest. She has frequent symptoms of PND and orthopnea. She has frequent dizzy spells with occasional syncope. She has accelerating symptoms of chest pain which she describes as pressure-like tightness across her chest that is not related to physical exertion or shortness of breath. Chest pain seems to come and goes sporadically. She has frequent palpitations. She has not had lower extremity edema nor abdominal swelling or bloating.  She was seen in followup recently by Dr. Meda Patterson performed followup echocardiogram which confirmed the presence of a perimembranous ventricular  septal defect with normal left and right ventricular function with at least moderate tricuspid regurgitation and the possibility of significant pulmonary hypertension. Subsequently she underwent left and right heart catheterization by Dr. Burt Patterson. This confirmed the presence of a ventricular septal defect with Qp:Qs measured 1.7:1.  There was mild pulmonary hypertension.  There was normal coronary artery anatomy with no significant coronary artery disease. The patient was referred for surgical consultation to discuss treatment options.    Past Medical History  Diagnosis Date  . Obesity   . Heart murmur   . Congenital ventricular septal defect   . Asthma   . Diabetes mellitus without complication   . VSD (ventricular septal defect), perimembranous     Qp:Qs 1.7:1 by catheterization   . Pulmonary hypertension     mild   . Tricuspid regurgitation     Past Surgical History  Procedure Laterality Date  . Tubal ligation    . Cyst excision      Family History  Problem Relation Age of Onset  . Cancer Other   . Hyperlipidemia Other   . Hypertension Other   . Asthma Other     History   Social History  . Marital Status: Single    Spouse Name: [name]    Number of Children: [name]  . Years of Education: [name]   Occupational History  . Not on file.   Social History Main Topics  . Smoking status: Never Smoker   . Smokeless tobacco: Never Used  . Alcohol Use: No  . Drug Use: No  . Sexual Activity: Yes    Birth Control/ Protection: Pill  Other Topics Concern  . Not on file   Social History Narrative  . No narrative on file    Current Outpatient Prescriptions  Medication Sig Dispense Refill  . Alogliptin-Metformin HCl 12.5-500 MG TABS Take 12.5-500 mg by mouth daily.      Marland Kitchen amLODipine (NORVASC) 5 MG tablet Take 5 mg by mouth daily.      . nitroGLYCERIN (NITROSTAT) 0.4 MG SL tablet Place 0.4 mg under the tongue every 5 (five) minutes as needed for chest pain.      . valACYclovir  (VALTREX) 500 MG tablet Take 500 mg by mouth daily.       No current facility-administered medications for this visit.    Allergies  Allergen Reactions  . Penicillins Anaphylaxis  . Latex Rash      Review of Systems:   General:  decreased appetite, decreased energy, + weight gain, no weight loss, no fever  Cardiac:  no chest pain with exertion, + chest pain at rest, + SOB with mild exertion, + occasional resting SOB, + PND, + orthopnea, + palpitations, no arrhythmia, no atrial fibrillation, no LE edema, + dizzy spells, + syncope  Respiratory:  + shortness of breath, no home oxygen, no productive cough, no dry cough, no bronchitis, + wheezing, no hemoptysis, + asthma, no pain with inspiration or cough, no sleep apnea, no CPAP at night  GI:   no difficulty swallowing, no reflux, no frequent heartburn, no hiatal hernia, + abdominal pain associated with menstruation, no constipation, no diarrhea, no hematochezia, no hematemesis, no melena  GU:   no dysuria,  no frequency, no urinary tract infection, no hematuria, no kidney stones, no kidney disease  Vascular:  no pain suggestive of claudication, + pain in feet, + leg cramps, no varicose veins, no DVT, no non-healing foot ulcer  Neuro:   no stroke, no TIA's, no seizures, + headaches, no temporary blindness one eye,  no slurred speech, no peripheral neuropathy, no chronic pain, no instability of gait, no memory/cognitive dysfunction  Musculoskeletal: no arthritis, no joint swelling, no myalgias, no difficulty walking, normal mobility   Skin:   + rash, + itching, + skin infections, no pressure sores or ulcerations  Psych:   no anxiety, no depression, + nervousness, + unusual recent stress  Eyes:   + blurry vision, no floaters, no recent vision changes, + wears glasses or contacts  ENT:   no hearing loss, no loose or painful teeth, + dentures, last saw dentist within the past year  Hematologic:  no easy bruising, no abnormal bleeding, no clotting  disorder, no frequent epistaxis  Endocrine:  + diabetes, + checks CBG's at home     Physical Exam:   BP 111/78  Pulse 83  Resp 16  Ht 4\' 11"  (1.499 m)  Wt 162 lb (73.483 kg)  BMI 32.70 kg/m2  SpO2 99%  General:  Mild-moderately obese but o/w  well-appearing  HEENT:  Unremarkable   Neck:   no JVD, no bruits, no adenopathy   Chest:   clear to auscultation, symmetrical breath sounds, no wheezes, no rhonchi   CV:   RRR, grade IV/VI systolic murmur   Abdomen:  soft, non-tender, no masses   Extremities:  warm, well-perfused, pulses palpable, no LE edema  Rectal/GU  Deferred  Neuro:   Grossly non-focal and symmetrical throughout  Skin:   Clean and dry, no rashes, no breakdown   Diagnostic Tests:  Transthoracic Echocardiography  Patient:    Mackenzie Patterson, Mackenzie Patterson MR #:  [id number] Study Date: 08/23/2013 Gender:     F Age:        67 Height:     147.3cm Weight:     74.4kg BSA:        1.65m^2 Pt. Status: Room:    ATTENDING    Jenkins Rouge  SONOGRAPHER  Victorio Palm, RDCS  ORDERING     Ena Dawley, M.D.  REFERRING    Ena Dawley, M.D.  PERFORMING   [facility], Outpatient cc:  ------------------------------------------------------------ LV EF: 60% -   65%  ------------------------------------------------------------ Indications:      785.2 Cardiac murmur.  Chest pain 786.51. Shortness of breath 786.05.  Ventricular septal defect 429.71.  ------------------------------------------------------------ History:   PMH:  Acquired from the patient and from the patient's chart.  Chest pain. Palpitations, dyspnea, exertional dyspnea, 5/6 holosystolic at lower left sternal bordermurmur, and lightheadedness.  Ventricular septal defect.  Risk factors:  Hypertension. Obese.  ------------------------------------------------------------ Study Conclusions  - Left ventricle: The cavity size was mildly dilated. Wall   thickness was normal. Systolic function was normal. The    estimated ejection fraction was in the range of 60% to   65%. - Left atrium: The atrium was mildly dilated. - Atrial septum: No defect or patent foramen ovale was   identified. - Tricuspid valve: Degree of TR and estimation of PA   pressure difficult due to contamination of TR jet from VSD   Mild-moderate regurgitation. - Pulmonary arteries: PA peak pressure: 87mm Hg (S). QP/QS   2.2 - Pericardium, extracardiac: A trivial pericardial effusion   was identified posterior to the heart. - Impressions: Some what large peri membranous VSD. Suggest   QP/QS and right heart cath at some point. No RV/RA   dilatation   TR velocity contaminated by VSD flow so hard to estimate   PA pressure. Elevated EDP by E/E; ratio Impressions:  - Some what large peri membranous VSD. Suggest QP/QS and   right heart cath at some point. No RV/RA dilatation   TR velocity contaminated by VSD flow so hard to estimate   PA pressure. Elevated EDP by E/E; ratio  ------------------------------------------------------------ Labs, prior tests, procedures, and surgery: Echocardiography (June 2014).    The study demonstrated LV hypertrophy, mild LV enlargementand perimembranous VSD. The tricuspid valve showed severe regurgitation.  EF was 65%.  Stress nuclear imaging (June 2014).    The study excluded myocardial ischemia.  EF was 62%. Transthoracic echocardiography.  M-mode, complete 2D, spectral Doppler, and color Doppler.  Height:  Height: 147.3cm. Height: 58in.  Weight:  Weight: 74.4kg. Weight: 163.7lb.  Body mass index:  BMI: 34.3kg/m^2.  Body surface area:    BSA: 1.38m^2.  Blood pressure:     120/75.  Patient status:  Outpatient.  Location:  [place] [facility]  ------------------------------------------------------------  ------------------------------------------------------------ Left ventricle:  The cavity size was mildly dilated. Wall thickness was normal. Systolic function was normal. The estimated  ejection fraction was in the range of 60% to 65%.   ------------------------------------------------------------ Aortic valve:   Structurally normal valve.   Cusp separation was normal.  Doppler:  Transvalvular velocity was within the normal range. There was no stenosis.  No significant regurgitation.  ------------------------------------------------------------ Aorta:  The aorta was normal, not dilated, and non-diseased.   ------------------------------------------------------------ Mitral valve:   Structurally normal valve.   Leaflet separation was normal.  Doppler:  Transvalvular velocity was within the normal range. There was no evidence for stenosis.  No significant regurgitation.    Peak gradient: 72mm Hg (D).   ------------------------------------------------------------  Left atrium:  The atrium was mildly dilated.  ------------------------------------------------------------ Atrial septum:  No defect or patent foramen ovale was identified.  ------------------------------------------------------------ Right ventricle:  The cavity size was normal. Wall thickness was normal. Systolic function was normal.  ------------------------------------------------------------ Pulmonic valve:    Structurally normal valve.   Cusp separation was normal.  Doppler:  Transvalvular velocity was within the normal range.  Mild regurgitation.  ------------------------------------------------------------ Tricuspid valve:  Degree of TR and estimation of PA pressure difficult due to contamination of TR jet from VSD Structurally normal valve.   Leaflet separation was normal. Doppler:  Transvalvular velocity was within the normal range.  Mild-moderate regurgitation.  ------------------------------------------------------------ Right atrium:  The atrium was normal in size.  ------------------------------------------------------------ Pericardium:  A trivial pericardial effusion was  identified posterior to the heart.  ------------------------------------------------------------ Post procedure conclusions Ascending Aorta:  - The aorta was normal, not dilated, and non-diseased.  ------------------------------------------------------------  2D measurements        Normal  Doppler measurements   Normal Left ventricle                 Main pulmonary LVID ED,   50.5 mm     43-52   artery chord,                         Pressure,     71 mm Hg =30 PLAX                           S LVID ES,   35.7 mm     23-38   Left ventricle chord,                         Ea, lat    10.46 cm/s  ------ PLAX                           ann, tiss FS, chord,   29 %      >29     DP PLAX                           E/Ea, lat  12.43       ------ LVPW, ED   9.44 mm     ------  ann, tiss IVS/LVPW   0.83        <1.3    DP ratio, ED                      Ea, med     7.46 cm/s  ------ Ventricular septum             ann, tiss IVS, ED    7.88 mm     ------  DP LVOT                           E/Ea, med  17.43       ------ Diam, S      22 mm     ------  ann, tiss Area        3.8 cm^2   ------  DP Diam         22 mm     ------  LVOT Aorta  Peak vel,    105 cm/s  ------ Root diam,   30 mm     ------  S ED                             VTI, S      18.7 cm    ------ Left atrium                    Stroke vol  71.1 ml    ------ AP dim       42 mm     ------  Stroke      42.6 ml/m^ ------ AP dim     2.51 cm/m^2 <2.2    index            2 index                          Mitral valve                                Peak E vel   130 cm/s  ------                                Peak A vel  99.4 cm/s  ------                                Decelerati   268 ms    150-23                                on time                0                                Peak           7 mm Hg ------                                gradient,                                D                                Peak  E/A     1.3       ------                                ratio                                Tricuspid valve                                Regurg       375  cm/s  ------                                peak vel                                Peak RV-RA    56 mm Hg ------                                gradient,                                S                                Systemic veins                                Estimated     15 mm Hg ------                                CVP                                Right ventricle                                Pressure,     71 mm Hg <30                                S                                Sa vel,     13.6 cm/s  ------                                lat ann,                                tiss DP   ------------------------------------------------------------ Prepared and Electronically Authenticated by  Jenkins Rouge [id number]:[id number]         Cardiac Catheterization Procedure Note  Name: Mackenzie Patterson MRN: [id number] DOB: [birth year]  Procedure: Right Heart Cath, Left Heart Cath, Selective Coronary Angiography, LV angiography  Indication: VSD          Procedural Details: The right groin was prepped, draped, and anesthetized with 1% lidocaine. Using the modified Seldinger technique a 4 French sheath was placed in the right femoral artery and a 7 French sheath was placed in the right femoral vein. A Swan-Ganz catheter was used for the right heart catheterization. Standard protocol was followed for recording of right heart pressures and sampling of oxygen saturations. Fick cardiac output was calculated. Standard Judkins catheters were used for selective coronary angiography and left ventriculography. There were  no immediate procedural complications. The patient was transferred to the post catheterization recovery area for further monitoring.  Procedural Findings: Hemodynamics RA A wave 9, V wave 8, mean pressure of  7 RV 39/6 PA and 39/18 with a mean of 27 PCWP A wave 13, V wave 9, mean of 10 LV 122/11 AO 119/73 with a mean of 95  Oxygen saturations: PA 84% AO 91% SVC 72% IVC 79%  Qp: 17.8 L/m Qs: 10.4 L/m Qp:Qs - 1.7:1  Coronary angiography: Coronary dominance: right  Left mainstem: The left main stem is widely patent. Divides into the LAD and left circumflex.  Left anterior descending (LAD): The LAD is widely patent throughout. The vessel supplies 2 diagonal branches with no significant stenoses.  Left circumflex (LCx): The left circumflex is patent. There are 2 obtuse marginal branches with no stenosis.  Right coronary artery (RCA): The right coronary artery is codominant with the circumflex. There is no obstructive disease present.  Left ventriculography: Left ventricular systolic function is normal. The LVEF is 65%. Ventriculography was done in the LAO projection this demonstrates shunting from the left ventricle into the right through a ventricular septal defect.  Final Conclusions:   1. Normal coronary arteries 2. Ventricular septal defect with QP:QS of 1.7 3. Normal right heart pressures with the exception of minimal pulmonary HTN (PVR less than 2 Woods units)  Sherren Mocha 09/11/2013, 9:28 AM     Impression:  Patient has a small-to-moderate sized perimembranous ventricular septal defect associated with progressive symptoms of chronic diastolic congestive heart failure, currently NYHA functional class III at least.  She also experiences atypical chest pain, tachypalpitations, dizzy spells and occasional syncope. Catheterization is notable for the absence of coronary artery disease and only mild pulmonary hypertension. Echocardiogram is notable for the absence of any associated aortic insufficiency, normal left and right ventricular function, and at least moderate tricuspid regurgitation.  Options include continued medical therapy versus elective surgical closure with or  without possible tricuspid valve repair. Given her progressive symptoms and the development of mild pulmonary hypertension, I would favor proceeding with elective surgery.   Plan:  I discussed options at length with the patient in the office today.  We discussed continued medical therapy, elective surgery, or referral to a tertiary care center.  I explained that we perform surgical repair on adults with congenital heart disease at [facility], but we do not have a pediatric congenital heart disease program.  Although some centers approach closure of perimembranous ventricular septal defects using minimally invasive surgical approach, I would favor a conventional sternotomy. The risks of surgery were discussed at length.  All of her questions been addressed. The patient wants to discuss matters with her family and return in 2 or 3 weeks with her father present before making a final decision.  I spent in excess of 90 minutes during the conduct of this office consultation and >50% of this time involved direct face-to-face encounter with the patient for counseling and/or coordination of their care.   Valentina Gu. Roxy Manns, MD 10/10/2013 5:08 PM

## 2013-10-10 NOTE — Telephone Encounter (Signed)
Patient calling in to get information regarding referral appointment today: name, time, location. Advised patient that she has an appointment today at 3:30 pm with Dr. Darylene Price - location address was being provided to patient when phone disconnected.  No one is answering phone at this time when number retried x5.

## 2013-10-10 NOTE — Telephone Encounter (Signed)
New Prob    Pt has some questing regarding a referral she was set up for to another office. Please call.

## 2013-10-15 ENCOUNTER — Ambulatory Visit: Payer: Medicaid Other | Admitting: *Deleted

## 2013-10-15 ENCOUNTER — Encounter: Payer: Medicaid Other | Attending: Cardiology

## 2013-10-15 DIAGNOSIS — E119 Type 2 diabetes mellitus without complications: Secondary | ICD-10-CM | POA: Diagnosis not present

## 2013-10-15 DIAGNOSIS — Z713 Dietary counseling and surveillance: Secondary | ICD-10-CM | POA: Diagnosis present

## 2013-10-16 NOTE — Progress Notes (Signed)

## 2013-10-20 ENCOUNTER — Emergency Department (HOSPITAL_COMMUNITY)
Admission: EM | Admit: 2013-10-20 | Discharge: 2013-10-20 | Disposition: A | Discharge status: Home / Self Care | Payer: Medicaid Other | Attending: Emergency Medicine | Admitting: Emergency Medicine

## 2013-10-20 ENCOUNTER — Encounter (HOSPITAL_COMMUNITY): Payer: Self-pay | Admitting: Emergency Medicine

## 2013-10-20 ENCOUNTER — Emergency Department (HOSPITAL_COMMUNITY): Payer: Medicaid Other

## 2013-10-20 DIAGNOSIS — Z880 Allergy status to penicillin: Secondary | ICD-10-CM | POA: Insufficient documentation

## 2013-10-20 DIAGNOSIS — R011 Cardiac murmur, unspecified: Secondary | ICD-10-CM | POA: Insufficient documentation

## 2013-10-20 DIAGNOSIS — R51 Headache: Secondary | ICD-10-CM | POA: Insufficient documentation

## 2013-10-20 DIAGNOSIS — R0789 Other chest pain: Secondary | ICD-10-CM

## 2013-10-20 DIAGNOSIS — J45901 Unspecified asthma with (acute) exacerbation: Secondary | ICD-10-CM | POA: Insufficient documentation

## 2013-10-20 DIAGNOSIS — Z8774 Personal history of (corrected) congenital malformations of heart and circulatory system: Secondary | ICD-10-CM | POA: Insufficient documentation

## 2013-10-20 DIAGNOSIS — Z91040 Latex allergy status: Secondary | ICD-10-CM | POA: Insufficient documentation

## 2013-10-20 DIAGNOSIS — H538 Other visual disturbances: Secondary | ICD-10-CM | POA: Insufficient documentation

## 2013-10-20 DIAGNOSIS — Z79899 Other long term (current) drug therapy: Secondary | ICD-10-CM | POA: Insufficient documentation

## 2013-10-20 DIAGNOSIS — E669 Obesity, unspecified: Secondary | ICD-10-CM | POA: Insufficient documentation

## 2013-10-20 DIAGNOSIS — E119 Type 2 diabetes mellitus without complications: Secondary | ICD-10-CM | POA: Insufficient documentation

## 2013-10-20 LAB — BASIC METABOLIC PANEL
BUN: 9 mg/dL (ref 6–23)
CALCIUM: 9.3 mg/dL (ref 8.4–10.5)
CO2: 24 meq/L (ref 19–32)
Chloride: 100 mEq/L (ref 96–112)
Creatinine, Ser: 0.74 mg/dL (ref 0.50–1.10)
GFR calc Af Amer: >90 mL/min (ref >90)
GFR calc non Af Amer: >90 mL/min (ref >90)
GLUCOSE: 182 mg/dL — AB (ref 70–99)
POTASSIUM: 3.7 meq/L (ref 3.7–5.3)
Sodium: 137 mEq/L (ref 137–147)

## 2013-10-20 LAB — CBC
HCT: 38.8 % (ref 36.0–46.0)
HEMOGLOBIN: 13.4 g/dL (ref 12.0–15.0)
MCH: 30.1 pg (ref 26.0–34.0)
MCHC: 34.5 g/dL (ref 30.0–36.0)
MCV: 87.2 fL (ref 78.0–100.0)
PLATELETS: 248 10*3/uL (ref 150–400)
RBC: 4.45 MIL/uL (ref 3.87–5.11)
RDW: 12.8 % (ref 11.5–15.5)
WBC: 5.9 10*3/uL (ref 4.0–10.5)

## 2013-10-20 LAB — I-STAT TROPONIN, ED: TROPONIN I, POC: 0 ng/mL (ref 0.00–0.08)

## 2013-10-20 MED ORDER — IBUPROFEN 800 MG PO TABS: 800.0000 mg | ORAL_TABLET | Freq: Three times a day (TID) | ORAL | Status: DC

## 2013-10-20 MED ORDER — DIPHENHYDRAMINE HCL 50 MG/ML IJ SOLN
25.0000 mg | Freq: Once | INTRAMUSCULAR | Status: AC
  Administered 2013-10-20: 25 mg via INTRAMUSCULAR
  Filled 2013-10-20: qty 1

## 2013-10-20 MED ORDER — GI COCKTAIL ~~LOC~~
30.0000 mL | Freq: Once | ORAL | Status: AC
  Administered 2013-10-20: 30 mL via ORAL
  Filled 2013-10-20: qty 30

## 2013-10-20 MED ORDER — METOCLOPRAMIDE HCL 5 MG/ML IJ SOLN
10.0000 mg | Freq: Once | INTRAMUSCULAR | Status: AC
  Administered 2013-10-20: 10 mg via INTRAMUSCULAR
  Filled 2013-10-20: qty 2

## 2013-10-20 MED ORDER — ACETAMINOPHEN-CODEINE #3 300-30 MG PO TABS: 1.0000 | ORAL_TABLET | Freq: Three times a day (TID) | ORAL | Status: DC | PRN

## 2013-10-20 NOTE — ED Provider Notes (Signed)
CSN: [id number]     Arrival date & time 10/20/13  1119 History   First MD Initiated Contact with Patient 10/20/13 1148     Chief Complaint  Patient presents with  . Chest Pain  . Shortness of Breath     (Consider location/radiation/quality/duration/timing/severity/associated sxs/prior Treatment) HPI Comments: Patient is a 43 year old female past medical history significant for congenital ventricular septal defect, pulmonary hypertension, DM, tricuspid regurgitation presented to the emergency room for central chest pressure with radiation under her left breast with associated shortness of breath and generalized throbbing headache with bilateral blurred vision that began yesterday morning at 8:30 AM. Patient has tried hydrocodone, tylenol, and motrin w/ no relief of symptoms. No aggravating factors. Patient denies ever having any of these symptoms in the past, except for the SOB, but has multiple previous ED visits for all symptoms with similar stories. Patient had negative cath on 08/2013, performed for evaluation of VSD by Dr. Burt Knack. PERC negative. No early familial cardiac history.   Patient is a 43 y.o. female presenting with chest pain and shortness of breath.  Chest Pain Associated symptoms: headache and shortness of breath   Associated symptoms: no abdominal pain, no nausea and not vomiting   Shortness of Breath Associated symptoms: chest pain and headaches   Associated symptoms: no abdominal pain and no vomiting     Past Medical History  Diagnosis Date  . Obesity   . Heart murmur   . Congenital ventricular septal defect   . Asthma   . Diabetes mellitus without complication   . VSD (ventricular septal defect), perimembranous     Qp:Qs 1.7:1 by catheterization   . Pulmonary hypertension     mild   . Tricuspid regurgitation    Past Surgical History  Procedure Laterality Date  . Tubal ligation    . Cyst excision     Family History  Problem Relation Age of Onset  . Cancer  Other   . Hyperlipidemia Other   . Hypertension Other   . Asthma Other    History  Substance Use Topics  . Smoking status: Never Smoker   . Smokeless tobacco: Never Used  . Alcohol Use: No   OB History   Grav Para Term Preterm Abortions TAB SAB Ect Mult Living                 Review of Systems  Eyes: Positive for visual disturbance. Negative for photophobia and pain.  Respiratory: Positive for shortness of breath.   Cardiovascular: Positive for chest pain.  Gastrointestinal: Negative for nausea, vomiting, abdominal pain and diarrhea.  Neurological: Positive for headaches.      Allergies  Penicillins and Latex  Home Medications   Prior to Admission medications   Medication Sig Start Date End Date Taking? Authorizing Provider  Alogliptin-Metformin HCl 12.5-500 MG TABS Take 12.5-500 mg by mouth daily.    Historical Provider, MD  amLODipine (NORVASC) 5 MG tablet Take 5 mg by mouth daily.    Historical Provider, MD  nitroGLYCERIN (NITROSTAT) 0.4 MG SL tablet Place 0.4 mg under the tongue every 5 (five) minutes as needed for chest pain.    Historical Provider, MD  valACYclovir (VALTREX) 500 MG tablet Take 500 mg by mouth daily.    Historical Provider, MD   BP 127/92  Pulse 85  Temp(Src) 98.3 F (36.8 C) (Oral)  Resp 18  SpO2 97% Physical Exam  Nursing note and vitals reviewed. Constitutional: She is oriented to person, place, and time. She appears  well-developed and well-nourished. No distress.  HENT:  Head: Normocephalic and atraumatic.  Right Ear: External ear normal.  Left Ear: External ear normal.  Nose: Nose normal.  Mouth/Throat: Oropharynx is clear and moist. No oropharyngeal exudate.  Eyes: Conjunctivae and EOM are normal. Pupils are equal, round, and reactive to light.  Neck: Normal range of motion. Neck supple.  Cardiovascular: Normal rate, regular rhythm and intact distal pulses.   Murmur heard.  Systolic murmur is present  Pulmonary/Chest: Effort normal  and breath sounds normal. No respiratory distress. She exhibits tenderness.  Abdominal: Soft. Bowel sounds are normal. There is no tenderness. There is no rebound and no guarding.  Musculoskeletal: Normal range of motion. She exhibits no edema.  Lymphadenopathy:    She has no cervical adenopathy.  Neurological: She is alert and oriented to person, place, and time. She has normal strength. No cranial nerve deficit. Gait normal. GCS eye subscore is 4. GCS verbal subscore is 5. GCS motor subscore is 6.  Moves all extremities w/o atxia  Skin: Skin is warm and dry. She is not diaphoretic.    ED Course  Procedures (including critical care time) Medications  gi cocktail (Maalox,Lidocaine,Donnatal) (30 mLs Oral Given 10/20/13 1239)  metoCLOPramide (REGLAN) injection 10 mg (10 mg Intramuscular Given 10/20/13 1404)  diphenhydrAMINE (BENADRYL) injection 25 mg (25 mg Intramuscular Given 10/20/13 1404)    Labs Review Labs Reviewed  BASIC METABOLIC PANEL - Abnormal; Notable for the following:    Glucose, Bld 182 (*)    All other components within normal limits  CBC  I-STAT TROPOININ, ED    Imaging Review Dg Chest 2 View  10/20/2013   CLINICAL DATA:  Chest pain, short of breath  EXAM: CHEST  2 VIEW  COMPARISON:  DG CHEST 2 VIEW dated 05/19/2013  FINDINGS: Normal mediastinum and cardiac silhouette. Normal pulmonary vasculature. No evidence of effusion, infiltrate, or pneumothorax. No acute bony abnormality.  IMPRESSION: No acute cardiopulmonary process.   Electronically Signed   By: Suzy Bouchard M.D.   On: 10/20/2013 12:32     EKG Interpretation None      Date: 10/20/2013  Rate: 91  Rhythm: normal sinus rhythm  QRS Axis: right  Intervals: normal  ST/T Wave abnormalities: early repolarization  Conduction Disutrbances:none  Narrative Interpretation:   Old EKG Reviewed: unchanged    MDM   Final diagnoses:  Chest pain, atypical    Filed Vitals:   10/20/13 1401  BP: 127/92   Pulse: 85  Temp:   Resp: 18   Patient with multiple ED visits for similar complaints with negative workups.  Afebrile, NAD, non-toxic appearing, AAOx4. No neurofocal deficits on examination. RRR w/ holosystolic murmur appreciated, not new. Abdomen s/nt/nd. Lungs CTA bilaterally.   Patient is to be discharged with recommendation to follow up with PCP in regards to today's hospital visit. Chest pain is not likely of cardiac or pulmonary etiology d/t presentation, perc negative, VSS, no tracheal deviation, no JVD or new murmur, RRR, breath sounds equal bilaterally, EKG without acute abnormalities, negative troponin, and negative CXR. Pt has been advised to return to the ED is CP becomes exertional, associated with diaphoresis or nausea, radiates to left jaw/arm, worsens or becomes concerning in any way. Pt appears reliable for follow up and is agreeable to discharge.   Case has been discussed with Dr. Venora Maples who agrees with the above plan to discharge.      Harlow Mares, PA-C 10/20/13 1544

## 2013-10-20 NOTE — ED Notes (Signed)
Patient is in no acute distress upon discharge.

## 2013-10-20 NOTE — Discharge Instructions (Signed)
Please follow up with your primary care physician in 1-2 days. If you do not have one please call the [facility] number listed above. Please follow up with Dr. Burt Knack to schedule a follow up appointment. Please take pain medication and/or muscle relaxants as prescribed and as needed for pain. Please do not drive on narcotic pain medication or on muscle relaxants. Please alternate between Motrin and Tylenol every three hours for fevers and pain. Please read all discharge instructions and return precautions.    Chest Pain (Nonspecific) It is often hard to give a specific diagnosis for the cause of chest pain. There is always a chance that your pain could be related to something serious, such as a heart attack or a blood clot in the lungs. You need to follow up with your caregiver for further evaluation. CAUSES   Heartburn.  Pneumonia or bronchitis.  Anxiety or stress.  Inflammation around your heart (pericarditis) or lung (pleuritis or pleurisy).  A blood clot in the lung.  A collapsed lung (pneumothorax). It can develop suddenly on its own (spontaneous pneumothorax) or from injury (trauma) to the chest.  Shingles infection (herpes zoster virus). The chest wall is composed of bones, muscles, and cartilage. Any of these can be the source of the pain.  The bones can be bruised by injury.  The muscles or cartilage can be strained by coughing or overwork.  The cartilage can be affected by inflammation and become sore (costochondritis). DIAGNOSIS  Lab tests or other studies, such as X-rays, electrocardiography, stress testing, or cardiac imaging, may be needed to find the cause of your pain.  TREATMENT   Treatment depends on what may be causing your chest pain. Treatment may include:  Acid blockers for heartburn.  Anti-inflammatory medicine.  Pain medicine for inflammatory conditions.  Antibiotics if an infection is present.  You may be advised to change  lifestyle habits. This includes stopping smoking and avoiding alcohol, caffeine, and chocolate.  You may be advised to keep your head raised (elevated) when sleeping. This reduces the chance of acid going backward from your stomach into your esophagus.  Most of the time, nonspecific chest pain will improve within 2 to 3 days with rest and mild pain medicine. HOME CARE INSTRUCTIONS   If antibiotics were prescribed, take your antibiotics as directed. Finish them even if you start to feel better.  For the next few days, avoid physical activities that bring on chest pain. Continue physical activities as directed.  Do not smoke.  Avoid drinking alcohol.  Only take over-the-counter or prescription medicine for pain, discomfort, or fever as directed by your caregiver.  Follow your caregiver's suggestions for further testing if your chest pain does not go away.  Keep any follow-up appointments you made. If you do not go to an appointment, you could develop lasting (chronic) problems with pain. If there is any problem keeping an appointment, you must call to reschedule. SEEK MEDICAL CARE IF:   You think you are having problems from the medicine you are taking. Read your medicine instructions carefully.  Your chest pain does not go away, even after treatment.  You develop a rash with blisters on your chest. SEEK IMMEDIATE MEDICAL CARE IF:   You have increased chest pain or pain that spreads to your arm, neck, jaw, back, or abdomen.  You develop shortness of breath, an increasing cough, or you are coughing up blood.  You have severe back or abdominal pain, feel nauseous, or vomit.  You develop severe weakness, fainting, or chills.  You have a fever. THIS IS AN EMERGENCY. Do not wait to see if the pain will go away. Get medical help at once. Call your local emergency services (911 in [place] Do not drive yourself to the hospital. MAKE SURE YOU:   Understand these instructions.  Will  watch your condition.  Will get help right away if you are not doing well or get worse. Document Released: 03/23/2005 Document Revised: 09/05/2011 Document Reviewed: 01/17/2008 [facility] Patient Information 2014 [place].

## 2013-10-20 NOTE — ED Notes (Signed)
Pt presents with complaint of shortness of breath, chest pain, and headache. Pt reports having chronic shortness of breath, however the chest pain is new. Pt reports centralized chest pain with radiation of pain to the right arm. Pt reports lightheadedness and weakness, however denies nausea and emesis. Pt states that when she has this type of headache it is usually the onset of seizure activity. Pt is A/O x4 and in NAD at this time.

## 2013-10-20 NOTE — ED Provider Notes (Signed)
Medical screening examination/treatment/procedure(s) were performed by non-physician practitioner and as supervising physician I was immediately available for consultation/collaboration.   EKG Interpretation None        Hoy Morn, MD 10/20/13 [phone]

## 2013-10-23 ENCOUNTER — Ambulatory Visit: Payer: Medicaid Other | Admitting: *Deleted

## 2013-11-04 ENCOUNTER — Encounter: Payer: Self-pay | Admitting: Thoracic Surgery (Cardiothoracic Vascular Surgery)

## 2013-11-04 ENCOUNTER — Ambulatory Visit (INDEPENDENT_AMBULATORY_CARE_PROVIDER_SITE_OTHER): Payer: Medicaid Other | Admitting: Thoracic Surgery (Cardiothoracic Vascular Surgery)

## 2013-11-04 VITALS — BP 115/73 | HR 90 | Resp 20 | Ht 59.0 in | Wt 162.0 lb

## 2013-11-04 DIAGNOSIS — I272 Pulmonary hypertension, unspecified: Secondary | ICD-10-CM

## 2013-11-04 DIAGNOSIS — I071 Rheumatic tricuspid insufficiency: Secondary | ICD-10-CM

## 2013-11-04 DIAGNOSIS — Q210 Ventricular septal defect: Secondary | ICD-10-CM

## 2013-11-04 DIAGNOSIS — I2789 Other specified pulmonary heart diseases: Secondary | ICD-10-CM

## 2013-11-04 DIAGNOSIS — I079 Rheumatic tricuspid valve disease, unspecified: Secondary | ICD-10-CM

## 2013-11-04 NOTE — Progress Notes (Signed)
[address]       [place] [address]             [phone]     CARDIOTHORACIC SURGERY OFFICE NOTE  Referring Provider is [name], MD PCP is [name], MD   HPI:  Patient returns for followup of small to moderate size perimembranous ventricular septal defect associated with secondary tricuspid regurgitation and progressive symptoms of chronic diastolic congestive heart failure with atypical chest pain and palpitations. She was originally seen in consultation on 10/10/2013. At that time we discussed options at length and the patient wanted to think about things and speak further with her family. Since then she has been seen in the emergency department on one occasion for acute exacerbation of atypical chest pain and shortness of breath. She returns to the office today with her parents present to discuss options further.   Current Outpatient Prescriptions  Medication Sig Dispense Refill  . acetaminophen-codeine (TYLENOL #3) 300-30 MG per tablet Take 1-2 tablets by mouth every 8 (eight) hours as needed.  15 tablet  0  . albuterol (PROAIR HFA) 108 (90 BASE) MCG/ACT inhaler Inhale 2 puffs into the lungs every 6 (six) hours as needed. For shortness of breath.      Marland Kitchen albuterol (PROVENTIL) (2.5 MG/3ML) 0.083% nebulizer solution Take 2.5 mg by nebulization every 6 (six) hours as needed. For shortness of breath.      . Alogliptin-Metformin HCl 12.5-500 MG TABS Take 12.5-500 mg by mouth daily.      Marland Kitchen amLODipine (NORVASC) 5 MG tablet Take 5 mg by mouth daily.      Marland Kitchen ibuprofen (ADVIL,MOTRIN) 800 MG tablet Take 1 tablet (800 mg total) by mouth 3 (three) times daily.  21 tablet  0  . nitroGLYCERIN (NITROSTAT) 0.4 MG SL tablet Place 0.4 mg under the tongue every 5 (five) minutes as needed for chest pain.      . valACYclovir (VALTREX) 500 MG tablet Take 500 mg by mouth daily.       No current facility-administered medications for this visit.      Physical  Exam:   BP 115/73  Pulse 90  Resp 20  Ht 4\' 11"  (1.499 m)  Wt 162 lb (73.483 kg)  BMI 32.70 kg/m2  SpO2 96%  General:  Obese but well-appearing  Chest:   Clear to auscultation  CV:   Regular rate and rhythm with holosystolic murmur  Incisions:  n/a  Abdomen:  Soft nontender  Extremities:  Warm and well-perfused  Diagnostic Tests:  n/a   Impression:  Patient has a small-to-moderate sized perimembranous ventricular septal defect associated with progressive symptoms of chronic diastolic congestive heart failure, currently NYHA functional class III at least. She also experiences atypical chest pain, tachypalpitations, dizzy spells and occasional syncope. Catheterization is notable for the absence of coronary artery disease and only mild pulmonary hypertension. Echocardiogram is notable for normal left and right ventricular function, and at least moderate tricuspid regurgitation, and the absence of any associated aortic insufficiency. Options include continued medical therapy versus elective surgical closure with or without possible tricuspid valve repair. Given her progressive symptoms and the development of mild pulmonary hypertension, I would favor proceeding with elective surgery.  Although some centers approach closure of perimembranous ventricular septal defects using minimally invasive surgical approach, I would favor a conventional sternotomy.     Plan:  I again discussed options at length with the patient and both of her parents in the  office today.  We discussed the relative risks and benefits of elective surgery, and contrasted this with continued medical therapy and close follow-up. The patient remains reluctant to consider surgery at this time.  Under the circumstances, I recommended that she undergo followup transthoracic echocardiogram and be seen in follow up by Dr. Meda Coffee in 6 months.  I will be happy to see her at that time as well, or sooner should she develop further  progression of symptoms.  All of their questions have been addressed.  I spent in excess of 15 minutes during the conduct of this office consultation and >50% of this time involved direct face-to-face encounter with the patient for counseling and/or coordination of their care.   Mackenzie Gu. Roxy Manns, MD 11/04/2013 5:23 PM

## 2013-11-04 NOTE — Patient Instructions (Signed)
Schedule an appointment with Dr Meda Coffee for repeat ECHO and office visit in 6 months

## 2013-11-05 ENCOUNTER — Other Ambulatory Visit: Payer: Self-pay | Admitting: *Deleted

## 2013-11-05 DIAGNOSIS — Q210 Ventricular septal defect: Secondary | ICD-10-CM

## 2013-11-05 DIAGNOSIS — I071 Rheumatic tricuspid insufficiency: Secondary | ICD-10-CM

## 2013-11-05 NOTE — Progress Notes (Signed)
Echocardiogram ordered per Dr Meda Coffee.  [facility] to notify pt of scheduled date.  6 month f/u appt made with Dr Meda Coffee per Dr Meda Coffee.

## 2013-11-21 ENCOUNTER — Ambulatory Visit (HOSPITAL_COMMUNITY): Payer: Medicaid Other | Attending: Cardiovascular Disease | Admitting: Radiology

## 2013-11-21 DIAGNOSIS — I079 Rheumatic tricuspid valve disease, unspecified: Secondary | ICD-10-CM | POA: Insufficient documentation

## 2013-11-21 DIAGNOSIS — Q210 Ventricular septal defect: Secondary | ICD-10-CM | POA: Insufficient documentation

## 2013-11-21 DIAGNOSIS — I510 Cardiac septal defect, acquired: Secondary | ICD-10-CM

## 2013-11-21 DIAGNOSIS — I071 Rheumatic tricuspid insufficiency: Secondary | ICD-10-CM

## 2013-11-21 NOTE — Progress Notes (Signed)
Echocardiogram performed.  

## 2013-11-30 ENCOUNTER — Emergency Department (HOSPITAL_COMMUNITY): Payer: Medicaid Other

## 2013-11-30 ENCOUNTER — Encounter (HOSPITAL_COMMUNITY): Payer: Self-pay | Admitting: Emergency Medicine

## 2013-11-30 ENCOUNTER — Emergency Department (HOSPITAL_COMMUNITY)
Admission: EM | Admit: 2013-11-30 | Discharge: 2013-11-30 | Disposition: A | Discharge status: Home / Self Care | Payer: Medicaid Other | Attending: Emergency Medicine | Admitting: Emergency Medicine

## 2013-11-30 DIAGNOSIS — Z79899 Other long term (current) drug therapy: Secondary | ICD-10-CM | POA: Insufficient documentation

## 2013-11-30 DIAGNOSIS — R51 Headache: Secondary | ICD-10-CM | POA: Insufficient documentation

## 2013-11-30 DIAGNOSIS — E119 Type 2 diabetes mellitus without complications: Secondary | ICD-10-CM | POA: Insufficient documentation

## 2013-11-30 DIAGNOSIS — J45909 Unspecified asthma, uncomplicated: Secondary | ICD-10-CM | POA: Insufficient documentation

## 2013-11-30 DIAGNOSIS — Z8679 Personal history of other diseases of the circulatory system: Secondary | ICD-10-CM | POA: Insufficient documentation

## 2013-11-30 DIAGNOSIS — Z791 Long term (current) use of non-steroidal anti-inflammatories (NSAID): Secondary | ICD-10-CM | POA: Insufficient documentation

## 2013-11-30 DIAGNOSIS — R52 Pain, unspecified: Secondary | ICD-10-CM | POA: Insufficient documentation

## 2013-11-30 DIAGNOSIS — Z91040 Latex allergy status: Secondary | ICD-10-CM | POA: Insufficient documentation

## 2013-11-30 DIAGNOSIS — R011 Cardiac murmur, unspecified: Secondary | ICD-10-CM | POA: Insufficient documentation

## 2013-11-30 DIAGNOSIS — E669 Obesity, unspecified: Secondary | ICD-10-CM | POA: Insufficient documentation

## 2013-11-30 DIAGNOSIS — Z880 Allergy status to penicillin: Secondary | ICD-10-CM | POA: Insufficient documentation

## 2013-11-30 DIAGNOSIS — R519 Headache, unspecified: Secondary | ICD-10-CM

## 2013-11-30 MED ORDER — METOCLOPRAMIDE HCL 5 MG/ML IJ SOLN
10.0000 mg | Freq: Once | INTRAMUSCULAR | Status: AC
  Administered 2013-11-30: 10 mg via INTRAVENOUS
  Filled 2013-11-30: qty 2

## 2013-11-30 MED ORDER — DIPHENHYDRAMINE HCL 50 MG/ML IJ SOLN
25.0000 mg | Freq: Once | INTRAMUSCULAR | Status: AC
  Administered 2013-11-30: 25 mg via INTRAVENOUS
  Filled 2013-11-30: qty 1

## 2013-11-30 MED ORDER — SODIUM CHLORIDE 0.9 % IV BOLUS (SEPSIS)
1000.0000 mL | Freq: Once | INTRAVENOUS | Status: AC
  Administered 2013-11-30: 1000 mL via INTRAVENOUS

## 2013-11-30 MED ORDER — KETOROLAC TROMETHAMINE 30 MG/ML IJ SOLN
30.0000 mg | Freq: Once | INTRAMUSCULAR | Status: AC
  Administered 2013-11-30: 30 mg via INTRAVENOUS
  Filled 2013-11-30: qty 1

## 2013-11-30 MED ORDER — BUTALBITAL-APAP-CAFFEINE 50-325-40 MG PO TABS: 1.0000 | ORAL_TABLET | Freq: Four times a day (QID) | ORAL | Status: DC | PRN

## 2013-11-30 NOTE — ED Provider Notes (Signed)
Medical screening examination/treatment/procedure(s) were performed by non-physician practitioner and as supervising physician I was immediately available for consultation/collaboration.   EKG Interpretation None        [name], MD 11/30/13 1258

## 2013-11-30 NOTE — ED Provider Notes (Signed)
CSN: [id number]     Arrival date & time 11/30/13  0915 History   First MD Initiated Contact with Patient 11/30/13 0915     Chief Complaint  Patient presents with  . Migraine  . Generalized Body Aches     (Consider location/radiation/quality/duration/timing/severity/associated sxs/prior Treatment) HPI  43 year old obese female with history of VSD, recurrent headache, non-insulin-dependent diabetes presents for evaluations of headache and body aches.  Patient reports gradual headache for the past week. Describe headaches as a sharp sensation throughout her head sometime residing the right eye. Headache starts in the morning and lasts throughout the day. Nothing seems to make it better or worse. No associated fever, neck stiffness, nausea vomiting diarrhea, numbness, weakness, or rash. Headaches does not stop her from doing her daily activities. Patient also complaining of pleuritic chest pain for the past week as well. Pain is sharp, 6/10, worsening with taking deep breath. Symptom has been persistent. She has been taking 800 mg of ibuprofen daily with minimal relief. She denies any heavy strenuous activities, no recent injury. She denies any productive cough, hemoptysis. No prior history of PE or DVT, no recent surgery, prolonged bed rest, unilateral leg swelling or calf pain, or history of cancer. Her real complaint is having some nasal congestion without sneezing cough or sore throat  Furthermore patient states that she had her nipple pierced several month ago. On occasion she noticed some pus or discharge at the piercing site. This is intermittent, only noticed when she cleans the piercing. She denies having any nipple pain, or bloody discharge  Past Medical History  Diagnosis Date  . Obesity   . Heart murmur   . Congenital ventricular septal defect   . Asthma   . Diabetes mellitus without complication   . VSD (ventricular septal defect), perimembranous     Qp:Qs 1.7:1 by catheterization    . Pulmonary hypertension     mild   . Tricuspid regurgitation    Past Surgical History  Procedure Laterality Date  . Tubal ligation    . Cyst excision     Family History  Problem Relation Age of Onset  . Cancer Other   . Hyperlipidemia Other   . Hypertension Other   . Asthma Other    History  Substance Use Topics  . Smoking status: Never Smoker   . Smokeless tobacco: Never Used  . Alcohol Use: No   OB History   Grav Para Term Preterm Abortions TAB SAB Ect Mult Living                 Review of Systems  All other systems reviewed and are negative.     Allergies  Penicillins and Latex  Home Medications   Prior to Admission medications   Medication Sig Start Date End Date Taking? Authorizing Provider  acetaminophen-codeine (TYLENOL #3) 300-30 MG per tablet Take 1-2 tablets by mouth every 8 (eight) hours as needed. 10/20/13   Jennifer L Piepenbrink, PA-C  albuterol (PROAIR HFA) 108 (90 BASE) MCG/ACT inhaler Inhale 2 puffs into the lungs every 6 (six) hours as needed. For shortness of breath.    Historical Provider, MD  albuterol (PROVENTIL) (2.5 MG/3ML) 0.083% nebulizer solution Take 2.5 mg by nebulization every 6 (six) hours as needed. For shortness of breath.    Historical Provider, MD  Alogliptin-Metformin HCl 12.5-500 MG TABS Take 12.5-500 mg by mouth daily.    Historical Provider, MD  amLODipine (NORVASC) 5 MG tablet Take 5 mg by mouth daily.  Historical Provider, MD  ibuprofen (ADVIL,MOTRIN) 800 MG tablet Take 1 tablet (800 mg total) by mouth 3 (three) times daily. 10/20/13   Jennifer L Piepenbrink, PA-C  nitroGLYCERIN (NITROSTAT) 0.4 MG SL tablet Place 0.4 mg under the tongue every 5 (five) minutes as needed for chest pain.    Historical Provider, MD  valACYclovir (VALTREX) 500 MG tablet Take 500 mg by mouth daily.    Historical Provider, MD   BP 126/75  Pulse 90  Temp(Src) 98.9 F (37.2 C) (Oral)  Resp 17  SpO2 100% Physical Exam  Nursing note and vitals  reviewed. Constitutional: She is oriented to person, place, and time. She appears well-developed and well-nourished. No distress.  HENT:  Head: Atraumatic.  Right Ear: External ear normal.  Left Ear: External ear normal.  Mouth/Throat: Oropharynx is clear and moist.  Eyes: Conjunctivae are normal.  Neck: Normal range of motion. Neck supple.  No nuchal rigidity  Cardiovascular: Normal rate and regular rhythm.  Exam reveals no gallop and no friction rub.   Murmur (4/6 systolic murmur) heard. Pulmonary/Chest: Effort normal and breath sounds normal. She exhibits tenderness (Mild generalized diffuse tenderness to the anterior chest on palpation without focal point tenderness, emphysema, or crepitus.).  Chaperone present:  Breasts was examined by me, piercing are in place at nipple without any surrounding erythema, abscess, or discharge noted. Nipples are nontender on exam.  Abdominal: Soft. There is no tenderness.  Neurological: She is alert and oriented to person, place, and time. She has normal strength. No cranial nerve deficit or sensory deficit. GCS eye subscore is 4. GCS verbal subscore is 5. GCS motor subscore is 6.  Skin: No rash noted.  Psychiatric: She has a normal mood and affect.    ED Course  Procedures (including critical care time)  9:55 AM Headache similar to previous, no fever, neck stiffness, neuro findings or new symptoms to suggest more serious etiology.  I don't think SAH, ICH, meningitis, encephalitis, mass at this time.  No recent trauma.  I don't feel imaging necessary at this time.  Plan to control symptoms.  Chest wall pain, no rash, no sxs suggestive of cardiac disease.  PERC negative.  CXR ordered.  12:39 PM Chest x-ray show no acute abnormalities. Evidence of cardiomegaly, this is likely chronic given her history of VSD. Patient report moderate improvement after receiving cocktail her pain is 2/10. She is stable for discharge with followup with her PCP. Fioricet  prescribed to use as needed for headache.  Labs Review Labs Reviewed - No data to display  Imaging Review Dg Chest 2 View  11/30/2013   CLINICAL DATA:  Chest pain.  EXAM: CHEST  2 VIEW  COMPARISON:  PA and lateral chest 10/20/2013 and 05/19/2013.  FINDINGS: The lungs are clear. There is cardiomegaly. No pneumothorax or pleural effusion.  IMPRESSION: Cardiomegaly without acute disease.   Electronically Signed   By: Inge Rise M.D.   On: 11/30/2013 11:09     EKG Interpretation None      MDM   Final diagnoses:  Headache    BP 119/70  Pulse 94  Temp(Src) 98.9 F (37.2 C) (Oral)  Resp 16  SpO2 98%  LMP 11/25/2013  I have reviewed nursing notes and vital signs. I personally reviewed the imaging tests through PACS system  I reviewed available ER/hospitalization records thought the EMR     Domenic Moras, [place] 11/30/13 1240

## 2013-11-30 NOTE — ED Notes (Signed)
Pt states migraine, body aches for 1 week.  No cough/congestion or fever.

## 2013-11-30 NOTE — Discharge Instructions (Signed)

## 2013-12-18 ENCOUNTER — Emergency Department (HOSPITAL_COMMUNITY)
Admission: EM | Admit: 2013-12-18 | Discharge: 2013-12-18 | Disposition: A | Discharge status: Home / Self Care | Payer: Medicaid Other | Attending: Emergency Medicine | Admitting: Emergency Medicine

## 2013-12-18 ENCOUNTER — Encounter (HOSPITAL_COMMUNITY): Payer: Self-pay | Admitting: Emergency Medicine

## 2013-12-18 DIAGNOSIS — J45909 Unspecified asthma, uncomplicated: Secondary | ICD-10-CM | POA: Insufficient documentation

## 2013-12-18 DIAGNOSIS — R011 Cardiac murmur, unspecified: Secondary | ICD-10-CM | POA: Insufficient documentation

## 2013-12-18 DIAGNOSIS — IMO0002 Reserved for concepts with insufficient information to code with codable children: Secondary | ICD-10-CM | POA: Insufficient documentation

## 2013-12-18 DIAGNOSIS — Z91040 Latex allergy status: Secondary | ICD-10-CM | POA: Insufficient documentation

## 2013-12-18 DIAGNOSIS — E669 Obesity, unspecified: Secondary | ICD-10-CM | POA: Insufficient documentation

## 2013-12-18 DIAGNOSIS — L259 Unspecified contact dermatitis, unspecified cause: Secondary | ICD-10-CM | POA: Insufficient documentation

## 2013-12-18 DIAGNOSIS — Q210 Ventricular septal defect: Secondary | ICD-10-CM | POA: Insufficient documentation

## 2013-12-18 DIAGNOSIS — Z79899 Other long term (current) drug therapy: Secondary | ICD-10-CM | POA: Insufficient documentation

## 2013-12-18 DIAGNOSIS — I10 Essential (primary) hypertension: Secondary | ICD-10-CM | POA: Insufficient documentation

## 2013-12-18 DIAGNOSIS — E119 Type 2 diabetes mellitus without complications: Secondary | ICD-10-CM | POA: Insufficient documentation

## 2013-12-18 DIAGNOSIS — I369 Nonrheumatic tricuspid valve disorder, unspecified: Secondary | ICD-10-CM | POA: Insufficient documentation

## 2013-12-18 DIAGNOSIS — Z880 Allergy status to penicillin: Secondary | ICD-10-CM | POA: Insufficient documentation

## 2013-12-18 MED ORDER — DIPHENHYDRAMINE HCL 25 MG PO TABS: 25.0000 mg | ORAL_TABLET | Freq: Four times a day (QID) | ORAL | Status: DC | PRN

## 2013-12-18 MED ORDER — RANITIDINE HCL 150 MG PO TABS: 150.0000 mg | ORAL_TABLET | Freq: Two times a day (BID) | ORAL | Status: DC

## 2013-12-18 MED ORDER — HYDROCORTISONE 1 % EX CREA: 1.0000 "application " | TOPICAL_CREAM | Freq: Two times a day (BID) | CUTANEOUS | Status: DC

## 2013-12-18 NOTE — Discharge Instructions (Signed)

## 2013-12-18 NOTE — ED Provider Notes (Signed)
Medical screening examination/treatment/procedure(s) were performed by non-physician practitioner and as supervising physician I was immediately available for consultation/collaboration.   EKG Interpretation None       Olga M [place], MD 12/18/13 0456 

## 2013-12-18 NOTE — ED Provider Notes (Signed)
CSN: [id number]     Arrival date & time 12/18/13  7673 History   First MD Initiated Contact with Patient 12/18/13 [phone]     Chief Complaint  Patient presents with  . Rash    (Consider location/radiation/quality/duration/timing/severity/associated sxs/prior Treatment) Patient is a 43 y.o. female presenting with rash. The history is provided by the patient. No language interpreter was used.  Rash Location:  Shoulder/arm Shoulder/arm rash location:  L upper arm Quality: dryness, itchiness, redness and weeping   Quality: not painful, not peeling, not scaling and not swelling   Severity:  Mild Onset quality:  Gradual Duration:  3 days Timing:  Constant Progression:  Worsening Chronicity:  New Context: not exposure to similar rash, not food, not medications, not new detergent/soap, not nuts, not plant contact and not sick contacts   Relieved by:  None tried Worsened by:  Nothing tried Associated symptoms: no abdominal pain, no fever, no induration, no shortness of breath, no throat swelling, no tongue swelling, no URI and not vomiting     Past Medical History  Diagnosis Date  . Obesity   . Heart murmur   . Congenital ventricular septal defect   . Asthma   . Diabetes mellitus without complication   . VSD (ventricular septal defect), perimembranous     Qp:Qs 1.7:1 by catheterization   . Pulmonary hypertension     mild   . Tricuspid regurgitation    Past Surgical History  Procedure Laterality Date  . Tubal ligation    . Cyst excision     Family History  Problem Relation Age of Onset  . Cancer Other   . Hyperlipidemia Other   . Hypertension Other   . Asthma Other    History  Substance Use Topics  . Smoking status: Never Smoker   . Smokeless tobacco: Never Used  . Alcohol Use: No   OB History   Grav Para Term Preterm Abortions TAB SAB Ect Mult Living                  Review of Systems  Constitutional: Negative for fever.  Respiratory: Negative for shortness of  breath.   Gastrointestinal: Negative for vomiting and abdominal pain.  Skin: Positive for rash.  All other systems reviewed and are negative.    Allergies  Penicillins and Latex  Home Medications   Prior to Admission medications   Medication Sig Start Date End Date Taking? Authorizing Provider  albuterol (PROVENTIL) (2.5 MG/3ML) 0.083% nebulizer solution Take 2.5 mg by nebulization every 6 (six) hours as needed for wheezing or shortness of breath.   Yes Historical Provider, MD  Alogliptin-Metformin HCl (KAZANO) 12.10-998 MG TABS Take 1 tablet by mouth 2 (two) times daily.   Yes Historical Provider, MD  amLODipine (NORVASC) 5 MG tablet Take 5 mg by mouth daily.   Yes Historical Provider, MD  prenatal vitamin w/FE, FA (PRENATAL 1 + 1) 27-1 MG TABS tablet Take 1 tablet by mouth daily at 12 noon.   Yes Historical Provider, MD  valACYclovir (VALTREX) 500 MG tablet Take 500 mg by mouth daily.   Yes Historical Provider, MD  albuterol (PROVENTIL HFA;VENTOLIN HFA) 108 (90 BASE) MCG/ACT inhaler Inhale 2 puffs into the lungs every 6 (six) hours as needed for wheezing or shortness of breath.    Historical Provider, MD  butalbital-acetaminophen-caffeine (FIORICET) 50-325-40 MG per tablet Take 1-2 tablets by mouth every 6 (six) hours as needed for headache. 11/30/13 11/30/14  Domenic Moras, PA-C  diphenhydrAMINE (BENADRYL) 25 MG  tablet Take 1 tablet (25 mg total) by mouth every 6 (six) hours as needed for itching (Rash). 12/18/13   Antonietta Breach, PA-C  hydrocortisone cream 1 % Apply 1 application topically 2 (two) times daily. Do not apply to face 12/18/13   Antonietta Breach, PA-C  nitroGLYCERIN (NITROSTAT) 0.4 MG SL tablet Place 0.4 mg under the tongue every 5 (five) minutes as needed for chest pain.    Historical Provider, MD  ranitidine (ZANTAC) 150 MG tablet Take 1 tablet (150 mg total) by mouth 2 (two) times daily. 12/18/13   Antonietta Breach, PA-C   BP 126/91  Pulse 89  Temp(Src) 98 F (36.7 C) (Oral)  Resp 15   SpO2 100%  LMP 11/25/2013  Physical Exam  Nursing note and vitals reviewed. Constitutional: She is oriented to person, place, and time. She appears well-developed and well-nourished. No distress.  Nontoxic/nonseptic appearing  HENT:  Head: Normocephalic and atraumatic.  Patient tolerating secretions without difficulty. No angioedema.  Eyes: Conjunctivae and EOM are normal. No scleral icterus.  Neck: Normal range of motion.  Cardiovascular: Normal rate, regular rhythm and intact distal pulses.   Pulmonary/Chest: Effort normal. No respiratory distress.  No tachypnea or stridor.  Musculoskeletal: Normal range of motion.  Neurological: She is alert and oriented to person, place, and time.  Skin: Skin is warm and dry. Rash noted. She is not diaphoretic. No erythema. No pallor.  Maculopapular rash appreciated to left upper extremity. Rash is erythematous and pruritic without weeping or drainage. Area approximately 5 cm in diameter. No induration, heat to touch, or red streaking. Negative Nikolsky sign.  Psychiatric: She has a normal mood and affect. Her behavior is normal.    ED Course  Procedures (including critical care time) Labs Review Labs Reviewed - No data to display  Imaging Review No results found.   EKG Interpretation None      MDM   Final diagnoses:  Contact dermatitis    Uncomplicated contact dermatitis. No new topical contacts known to the patient. No recent antibiotic use. Rash today consistent with contact dermatitis. Patient has been advised to use topical hydrocortisone cream as well as Benadryl and Zantac for symptom management. Rash not concerning for SJS, erythema multiforme major, or erythema multiforme a minor. Patient afebrile, hemodynamically stable, and stable for discharge with instruction to followup with her primary care doctor as needed. Return precautions provided and patient agreeable to plan with no unaddressed concerns.   Filed Vitals:    12/18/13 0332  BP: 126/91  Pulse: 89  Temp: 98 F (36.7 C)  TempSrc: Oral  Resp: 15  SpO2: 100%       Antonietta Breach, PA-C 12/18/13 [phone]

## 2013-12-18 NOTE — ED Notes (Signed)
Per pt report: pt reports seeing a bug on her left upper arm that she smacked off about 3 days ago. This evening pt presents with a red rash over area that pt reports is very itchy.  Pt a/o x 4. Pt ambulatory. Pt also reports some SOB. VSS. NAD noted.

## 2014-01-05 ENCOUNTER — Emergency Department (HOSPITAL_COMMUNITY)
Admission: EM | Admit: 2014-01-05 | Discharge: 2014-01-05 | Disposition: A | Discharge status: Home / Self Care | Payer: Medicaid Other | Attending: Emergency Medicine | Admitting: Emergency Medicine

## 2014-01-05 ENCOUNTER — Encounter (HOSPITAL_COMMUNITY): Payer: Self-pay | Admitting: Emergency Medicine

## 2014-01-05 ENCOUNTER — Emergency Department (HOSPITAL_COMMUNITY): Payer: Medicaid Other

## 2014-01-05 DIAGNOSIS — F329 Major depressive disorder, single episode, unspecified: Secondary | ICD-10-CM | POA: Diagnosis not present

## 2014-01-05 DIAGNOSIS — R011 Cardiac murmur, unspecified: Secondary | ICD-10-CM | POA: Insufficient documentation

## 2014-01-05 DIAGNOSIS — R002 Palpitations: Secondary | ICD-10-CM | POA: Diagnosis present

## 2014-01-05 DIAGNOSIS — E119 Type 2 diabetes mellitus without complications: Secondary | ICD-10-CM | POA: Insufficient documentation

## 2014-01-05 DIAGNOSIS — E669 Obesity, unspecified: Secondary | ICD-10-CM | POA: Insufficient documentation

## 2014-01-05 DIAGNOSIS — Z880 Allergy status to penicillin: Secondary | ICD-10-CM | POA: Insufficient documentation

## 2014-01-05 DIAGNOSIS — R1110 Vomiting, unspecified: Secondary | ICD-10-CM | POA: Diagnosis not present

## 2014-01-05 DIAGNOSIS — F3289 Other specified depressive episodes: Secondary | ICD-10-CM | POA: Diagnosis not present

## 2014-01-05 DIAGNOSIS — Z91040 Latex allergy status: Secondary | ICD-10-CM | POA: Insufficient documentation

## 2014-01-05 DIAGNOSIS — Z79899 Other long term (current) drug therapy: Secondary | ICD-10-CM | POA: Insufficient documentation

## 2014-01-05 DIAGNOSIS — J45909 Unspecified asthma, uncomplicated: Secondary | ICD-10-CM | POA: Diagnosis not present

## 2014-01-05 DIAGNOSIS — I2789 Other specified pulmonary heart diseases: Secondary | ICD-10-CM | POA: Diagnosis not present

## 2014-01-05 DIAGNOSIS — IMO0002 Reserved for concepts with insufficient information to code with codable children: Secondary | ICD-10-CM | POA: Insufficient documentation

## 2014-01-05 DIAGNOSIS — Q210 Ventricular septal defect: Secondary | ICD-10-CM | POA: Insufficient documentation

## 2014-01-05 LAB — ETHANOL: Alcohol, Ethyl (B): <11 mg/dL (ref 0–11)

## 2014-01-05 LAB — RAPID URINE DRUG SCREEN, HOSP PERFORMED
Amphetamines: NOT DETECTED
BARBITURATES: NOT DETECTED
Benzodiazepines: NOT DETECTED
Cocaine: NOT DETECTED
Opiates: NOT DETECTED
Tetrahydrocannabinol: NOT DETECTED

## 2014-01-05 LAB — CBC
HEMATOCRIT: 40.6 % (ref 36.0–46.0)
HEMOGLOBIN: 13.7 g/dL (ref 12.0–15.0)
MCH: 29.2 pg (ref 26.0–34.0)
MCHC: 33.7 g/dL (ref 30.0–36.0)
MCV: 86.6 fL (ref 78.0–100.0)
Platelets: 251 10*3/uL (ref 150–400)
RBC: 4.69 MIL/uL (ref 3.87–5.11)
RDW: 13.3 % (ref 11.5–15.5)
WBC: 7.3 10*3/uL (ref 4.0–10.5)

## 2014-01-05 LAB — COMPREHENSIVE METABOLIC PANEL
ALT: 21 U/L (ref 0–35)
ANION GAP: 15 (ref 5–15)
AST: 29 U/L (ref 0–37)
Albumin: 4.2 g/dL (ref 3.5–5.2)
Alkaline Phosphatase: 69 U/L (ref 39–117)
BILIRUBIN TOTAL: 0.5 mg/dL (ref 0.3–1.2)
BUN: 8 mg/dL (ref 6–23)
CHLORIDE: 99 meq/L (ref 96–112)
CO2: 25 meq/L (ref 19–32)
CREATININE: 0.8 mg/dL (ref 0.50–1.10)
Calcium: 9.6 mg/dL (ref 8.4–10.5)
GFR calc Af Amer: >90 mL/min (ref >90)
Glucose, Bld: 137 mg/dL — ABNORMAL HIGH (ref 70–99)
POTASSIUM: 4.8 meq/L (ref 3.7–5.3)
Sodium: 139 mEq/L (ref 137–147)
Total Protein: 8 g/dL (ref 6.0–8.3)

## 2014-01-05 LAB — SALICYLATE LEVEL: Salicylate Lvl: <2 mg/dL — ABNORMAL LOW (ref 2.8–20.0)

## 2014-01-05 LAB — ACETAMINOPHEN LEVEL

## 2014-01-05 LAB — I-STAT TROPONIN, ED: Troponin i, poc: 0.01 ng/mL (ref 0.00–0.08)

## 2014-01-05 LAB — D-DIMER, QUANTITATIVE (NOT AT ARMC)

## 2014-01-05 MED ORDER — LORAZEPAM 1 MG PO TABS
1.0000 mg | ORAL_TABLET | Freq: Once | ORAL | Status: AC
  Administered 2014-01-05: 1 mg via ORAL
  Filled 2014-01-05: qty 1

## 2014-01-05 NOTE — Discharge Instructions (Signed)

## 2014-01-05 NOTE — ED Notes (Signed)
Pt reports feeling palpations and anxiety onset 0400 unknown cause. Pt with flat affect in triage. Pt denies SI/HI.

## 2014-01-05 NOTE — ED Provider Notes (Signed)
CSN: [id number]     Arrival date & time 01/05/14  1057 History   First MD Initiated Contact with Patient 01/05/14 1127     Chief Complaint  Patient presents with  . Palpitations  . Anxiety     (Consider location/radiation/quality/duration/timing/severity/associated sxs/prior Treatment) HPI Comments: Pt state that she has a murmur and she will intermittently get palpitations but today it hasn't stopped. Denies cp. States that she does feel tingly in extremities.started at 4am this morning and is continuing  Patient is a 43 y.o. female presenting with palpitations and anxiety. The history is provided by the patient. No language interpreter was used.  Palpitations Palpitations quality:  Fast Onset quality:  Sudden Timing:  Constant Progression:  Unchanged Context: anxiety   Relieved by:  Nothing Worsened by:  Nothing tried Ineffective treatments:  None tried Associated symptoms: shortness of breath and vomiting   Associated symptoms: no chest pain, no cough, no syncope and no weakness   Anxiety Associated symptoms include vomiting. Pertinent negatives include no chest pain or coughing.    Past Medical History  Diagnosis Date  . Obesity   . Heart murmur   . Congenital ventricular septal defect   . Asthma   . Diabetes mellitus without complication   . VSD (ventricular septal defect), perimembranous     Qp:Qs 1.7:1 by catheterization   . Pulmonary hypertension     mild   . Tricuspid regurgitation    Past Surgical History  Procedure Laterality Date  . Tubal ligation    . Cyst excision     Family History  Problem Relation Age of Onset  . Cancer Other   . Hyperlipidemia Other   . Hypertension Other   . Asthma Other    History  Substance Use Topics  . Smoking status: Never Smoker   . Smokeless tobacco: Never Used  . Alcohol Use: No   OB History   Grav Para Term Preterm Abortions TAB SAB Ect Mult Living                 Review of Systems  Constitutional:  Negative.   Respiratory: Positive for shortness of breath. Negative for cough.   Cardiovascular: Positive for palpitations. Negative for chest pain and syncope.  Gastrointestinal: Positive for vomiting.      Allergies  Penicillins and Latex  Home Medications   Prior to Admission medications   Medication Sig Start Date End Date Taking? Authorizing Provider  albuterol (PROVENTIL HFA;VENTOLIN HFA) 108 (90 BASE) MCG/ACT inhaler Inhale 2 puffs into the lungs every 6 (six) hours as needed for wheezing or shortness of breath.    Historical Provider, MD  albuterol (PROVENTIL) (2.5 MG/3ML) 0.083% nebulizer solution Take 2.5 mg by nebulization every 6 (six) hours as needed for wheezing or shortness of breath.    Historical Provider, MD  Alogliptin-Metformin HCl (KAZANO) 12.10-998 MG TABS Take 1 tablet by mouth 2 (two) times daily.    Historical Provider, MD  amLODipine (NORVASC) 5 MG tablet Take 5 mg by mouth daily.    Historical Provider, MD  butalbital-acetaminophen-caffeine (FIORICET) 50-325-40 MG per tablet Take 1-2 tablets by mouth every 6 (six) hours as needed for headache. 11/30/13 11/30/14  Domenic Moras, PA-C  diphenhydrAMINE (BENADRYL) 25 MG tablet Take 1 tablet (25 mg total) by mouth every 6 (six) hours as needed for itching (Rash). 12/18/13   Antonietta Breach, PA-C  hydrocortisone cream 1 % Apply 1 application topically 2 (two) times daily. Do not apply to face 12/18/13  Antonietta Breach, PA-C  nitroGLYCERIN (NITROSTAT) 0.4 MG SL tablet Place 0.4 mg under the tongue every 5 (five) minutes as needed for chest pain.    Historical Provider, MD  prenatal vitamin w/FE, FA (PRENATAL 1 + 1) 27-1 MG TABS tablet Take 1 tablet by mouth daily at 12 noon.    Historical Provider, MD  ranitidine (ZANTAC) 150 MG tablet Take 1 tablet (150 mg total) by mouth 2 (two) times daily. 12/18/13   Antonietta Breach, PA-C  valACYclovir (VALTREX) 500 MG tablet Take 500 mg by mouth daily.    Historical Provider, MD   BP 144/88  Pulse 117   Temp(Src) 98.2 F (36.8 C) (Oral)  Resp 16  Ht 4\' 11"  (1.499 m)  Wt 165 lb (74.844 kg)  BMI 33.31 kg/m2  SpO2 98%  LMP 12/28/2013 Physical Exam  Nursing note and vitals reviewed. Constitutional: She is oriented to person, place, and time. She appears well-developed and well-nourished.  HENT:  Head: Normocephalic and atraumatic.  Cardiovascular:  Murmur heard. Pulmonary/Chest: Effort normal and breath sounds normal.  Abdominal: Soft. Bowel sounds are normal. There is no tenderness.  Musculoskeletal: Normal range of motion.  Neurological: She is alert and oriented to person, place, and time.  Skin: Skin is warm and dry.  Psychiatric: She has a normal mood and affect.    ED Course  Procedures (including critical care time) Labs Review Labs Reviewed  COMPREHENSIVE METABOLIC PANEL - Abnormal; Notable for the following:    Glucose, Bld 137 (*)    All other components within normal limits  SALICYLATE LEVEL - Abnormal; Notable for the following:    Salicylate Lvl <6.7 (*)    All other components within normal limits  ACETAMINOPHEN LEVEL  CBC  ETHANOL  URINE RAPID DRUG SCREEN (HOSP PERFORMED)  D-DIMER, QUANTITATIVE  I-STAT TROPOININ, ED    Imaging Review Dg Chest 2 View  01/05/2014   CLINICAL DATA:  Palpitations, chest pain, history hypertension, diabetes, asthma, heart murmur, VSD  EXAM: CHEST  2 VIEW  COMPARISON:  11/30/2013  FINDINGS: Enlargement of cardiac silhouette with pulmonary vascular congestion.  Mediastinal contours normal.  No acute infiltrate, pleural effusion or pneumothorax.  Bones unremarkable.  IMPRESSION: Enlargement of cardiac silhouette with pulmonary vascular congestion.  No acute infiltrate.   Electronically Signed   By: Lavonia Dana M.D.   On: 01/05/2014 12:41     EKG Interpretation   Date/Time:  Sunday January 05 2014 11:08:18 EDT Ventricular Rate:  108 PR Interval:  162 QRS Duration: 74 QT Interval:  348 QTC Calculation: 466 R Axis:   90 Text  Interpretation:  Sinus tachycardia Rightward axis Cannot rule out  Anterior infarct , age undetermined Abnormal ECG Confirmed by DELOS  MD,  DOUGLAS ([id number]) on 01/05/2014 12:06:36 PM      MDM   Final diagnoses:  Palpitations    No acute findings for pt having palpitation. Pt okay to follow up with cardiology. Pt verbalizes understanding    Glendell Docker, NP 01/05/14 1524

## 2014-01-05 NOTE — ED Provider Notes (Signed)
Medical screening examination/treatment/procedure(s) were performed by non-physician practitioner and as supervising physician I was immediately available for consultation/collaboration.   EKG Interpretation   Date/Time:  Sunday January 05 2014 11:08:18 EDT Ventricular Rate:  108 PR Interval:  162 QRS Duration: 74 QT Interval:  348 QTC Calculation: 466 R Axis:   90 Text Interpretation:  Sinus tachycardia Rightward axis Cannot rule out  Anterior infarct , age undetermined Abnormal ECG Confirmed by Beau Fanny  MD,  Nelton Amsden ([id number]) on 01/05/2014 12:06:36 PM       Veryl Speak, MD 01/05/14 1842

## 2014-02-06 ENCOUNTER — Encounter (HOSPITAL_COMMUNITY): Payer: Self-pay | Admitting: Emergency Medicine

## 2014-02-06 ENCOUNTER — Emergency Department (HOSPITAL_COMMUNITY)
Admission: EM | Admit: 2014-02-06 | Discharge: 2014-02-06 | Disposition: A | Discharge status: Home / Self Care | Payer: Medicaid Other | Attending: Emergency Medicine | Admitting: Emergency Medicine

## 2014-02-06 DIAGNOSIS — Z91040 Latex allergy status: Secondary | ICD-10-CM | POA: Diagnosis not present

## 2014-02-06 DIAGNOSIS — J45909 Unspecified asthma, uncomplicated: Secondary | ICD-10-CM | POA: Diagnosis not present

## 2014-02-06 DIAGNOSIS — M545 Low back pain, unspecified: Secondary | ICD-10-CM | POA: Insufficient documentation

## 2014-02-06 DIAGNOSIS — Z8679 Personal history of other diseases of the circulatory system: Secondary | ICD-10-CM | POA: Insufficient documentation

## 2014-02-06 DIAGNOSIS — E119 Type 2 diabetes mellitus without complications: Secondary | ICD-10-CM | POA: Diagnosis not present

## 2014-02-06 DIAGNOSIS — R011 Cardiac murmur, unspecified: Secondary | ICD-10-CM | POA: Diagnosis not present

## 2014-02-06 DIAGNOSIS — Z880 Allergy status to penicillin: Secondary | ICD-10-CM | POA: Insufficient documentation

## 2014-02-06 DIAGNOSIS — M5430 Sciatica, unspecified side: Secondary | ICD-10-CM | POA: Diagnosis not present

## 2014-02-06 DIAGNOSIS — Z79899 Other long term (current) drug therapy: Secondary | ICD-10-CM | POA: Diagnosis not present

## 2014-02-06 DIAGNOSIS — E669 Obesity, unspecified: Secondary | ICD-10-CM | POA: Diagnosis not present

## 2014-02-06 MED ORDER — IBUPROFEN 800 MG PO TABS: 800.0000 mg | ORAL_TABLET | Freq: Three times a day (TID) | ORAL | Status: DC

## 2014-02-06 MED ORDER — TRAMADOL HCL 50 MG PO TABS: 50.0000 mg | ORAL_TABLET | Freq: Four times a day (QID) | ORAL | Status: DC | PRN

## 2014-02-06 NOTE — ED Notes (Signed)
Pt c/o low back pain x 3 days.  Denies injury but states that she gets this pain "when the weather changes".

## 2014-02-06 NOTE — Discharge Instructions (Signed)
Call for a follow up appointment with a Family or Primary Care Provider.  Return if Symptoms worsen.   Take medication as prescribed.  Ice your back 3-4 times a day. Do not operate heavy machinery, drink alcohol while taking ultram.

## 2014-02-06 NOTE — ED Provider Notes (Signed)
CSN: [id number]     Arrival date & time 02/06/14  1319 History  This chart was scribed for non-physician practitioner, Harvie Heck, PA-C, working with Hoy Morn, MD by Lowella Petties, ED Scribe. The patient was seen in room WTR5/WTR5. Patient's care was started at 2:44 PM.   Chief Complaint  Patient presents with  . Back Pain   HPI Comments: Mackenzie Patterson is a 43 y.o. female who presents to the Emergency Department complaining of bilateral lower back pain onset 3 days ago and acutely worsened last night. She reports that last night her back felt hot and she had difficulty sleeping. She reports taking Ibuprofen (800 mg), Vicodin, and a muscle relaxer with minimal relief. She denies any lifting or injury to her back. She denies numbness or weakness. She reports that she has an appointment with her PCP next Wednesday. LNMP was July 28th.  PCP: [name], MD    The history is provided by the patient. No language interpreter was used.    Past Medical History  Diagnosis Date  . Obesity   . Heart murmur   . Congenital ventricular septal defect   . Asthma   . Diabetes mellitus without complication   . VSD (ventricular septal defect), perimembranous     Qp:Qs 1.7:1 by catheterization   . Pulmonary hypertension     mild   . Tricuspid regurgitation    Past Surgical History  Procedure Laterality Date  . Tubal ligation    . Cyst excision     Family History  Problem Relation Age of Onset  . Cancer Other   . Hyperlipidemia Other   . Hypertension Other   . Asthma Other    History  Substance Use Topics  . Smoking status: Never Smoker   . Smokeless tobacco: Never Used  . Alcohol Use: No   OB History   Grav Para Term Preterm Abortions TAB SAB Ect Mult Living                 Review of Systems  Musculoskeletal: Positive for back pain.  Neurological: Negative for weakness and numbness.   Allergies  Penicillins and Latex  Home Medications   Prior to Admission  medications   Medication Sig Start Date End Date Taking? Authorizing Provider  albuterol (PROVENTIL HFA;VENTOLIN HFA) 108 (90 BASE) MCG/ACT inhaler Inhale 2 puffs into the lungs every 6 (six) hours as needed for wheezing or shortness of breath.    Historical Provider, MD  albuterol (PROVENTIL) (2.5 MG/3ML) 0.083% nebulizer solution Take 2.5 mg by nebulization every 6 (six) hours as needed for wheezing or shortness of breath.    Historical Provider, MD  Alogliptin-Metformin HCl (KAZANO) 12.10-998 MG TABS Take 1 tablet by mouth 2 (two) times daily.    Historical Provider, MD  amLODipine (NORVASC) 5 MG tablet Take 5 mg by mouth daily.    Historical Provider, MD  nitroGLYCERIN (NITROSTAT) 0.4 MG SL tablet Place 0.4 mg under the tongue every 5 (five) minutes as needed for chest pain.    Historical Provider, MD  prenatal vitamin w/FE, FA (PRENATAL 1 + 1) 27-1 MG TABS tablet Take 1 tablet by mouth daily at 12 noon.    Historical Provider, MD  valACYclovir (VALTREX) 500 MG tablet Take 500 mg by mouth daily.    Historical Provider, MD   Triage Vitals: BP 130/73  Pulse 83  Temp(Src) 98.2 F (36.8 C) (Oral)  Resp 18  SpO2 100% Physical Exam  Nursing note and  vitals reviewed. Constitutional: She is oriented to person, place, and time. She appears well-developed and well-nourished.  Non-toxic appearance. She does not have a sickly appearance. She does not appear ill. No distress.  HENT:  Head: Normocephalic and atraumatic.  Neck: Neck supple.  Cardiovascular: Normal rate and regular rhythm.   Pulmonary/Chest: Effort normal. No respiratory distress.  Abdominal: Soft.  Musculoskeletal:  Palpation to bilateral SI joint recreates pain. Good and equal strength and sensation and touch to bilateral extremities. Normal gait.  Neurological: She is alert and oriented to person, place, and time.  Skin: Skin is warm and dry.  Psychiatric: She has a normal mood and affect. Her behavior is normal.    ED Course   Procedures (including critical care time)  2:48 PM-Discussed treatment plan which includes NSAIDs with pt at bedside and pt agreed to plan.   Labs Review Labs Reviewed - No data to display  Imaging Review No results found.   EKG Interpretation None      MDM   Final diagnoses:  Bilateral low back pain, with sciatica presence unspecified   Patient presents with bilateral back pain, no neurologic deficits on exam. Plan to discharge with anti-inflammatories and followup with PCP. No concern for cauda equina.  No fever, night sweats, weight loss, h/o cancer, IVDU.  RICE protocol and pain medicine indicated and discussed with patient.   Meds given in ED:  Medications - No data to display  Discharge Medication List as of 02/06/2014  2:51 PM    START taking these medications   Details  ibuprofen (ADVIL,MOTRIN) 800 MG tablet Take 1 tablet (800 mg total) by mouth 3 (three) times daily with meals., Starting 02/06/2014, Until Discontinued, Print    traMADol (ULTRAM) 50 MG tablet Take 1 tablet (50 mg total) by mouth every 6 (six) hours as needed., Starting 02/06/2014, Until Discontinued, Print         I personally performed the services described in this documentation, which was scribed in my presence. The recorded information has been reviewed and is accurate.   Harvie Heck, PA-C 02/06/14 2105

## 2014-02-07 NOTE — ED Provider Notes (Signed)
Medical screening examination/treatment/procedure(s) were performed by non-physician practitioner and as supervising physician I was immediately available for consultation/collaboration.   EKG Interpretation None        Hoy Morn, MD 02/07/14 2200

## 2014-03-18 ENCOUNTER — Other Ambulatory Visit (HOSPITAL_COMMUNITY): Payer: Self-pay | Admitting: Cardiology

## 2014-03-18 DIAGNOSIS — Q210 Ventricular septal defect: Secondary | ICD-10-CM

## 2014-03-19 ENCOUNTER — Other Ambulatory Visit: Payer: Self-pay | Admitting: Cardiology

## 2014-03-19 ENCOUNTER — Ambulatory Visit
Admission: RE | Admit: 2014-03-19 | Discharge: 2014-03-19 | Disposition: A | Discharge status: Home / Self Care | Payer: Medicaid Other | Source: Ambulatory Visit | Attending: Cardiology | Admitting: Cardiology

## 2014-03-19 DIAGNOSIS — R29898 Other symptoms and signs involving the musculoskeletal system: Secondary | ICD-10-CM

## 2014-03-21 ENCOUNTER — Ambulatory Visit (HOSPITAL_COMMUNITY)
Admission: RE | Admit: 2014-03-21 | Discharge: 2014-03-21 | Disposition: A | Discharge status: Home / Self Care | Payer: Medicaid Other | Source: Ambulatory Visit | Attending: Cardiology | Admitting: Cardiology

## 2014-03-21 ENCOUNTER — Encounter: Payer: Self-pay | Admitting: *Deleted

## 2014-03-21 DIAGNOSIS — I510 Cardiac septal defect, acquired: Secondary | ICD-10-CM | POA: Diagnosis present

## 2014-03-21 DIAGNOSIS — Q210 Ventricular septal defect: Secondary | ICD-10-CM

## 2014-03-21 DIAGNOSIS — I369 Nonrheumatic tricuspid valve disorder, unspecified: Secondary | ICD-10-CM

## 2014-03-21 NOTE — Progress Notes (Signed)
Echo Lab  2D Echocardiogram completed.  Saxon, RDCS 03/21/2014 2:42 PM

## 2014-03-25 ENCOUNTER — Ambulatory Visit (INDEPENDENT_AMBULATORY_CARE_PROVIDER_SITE_OTHER): Payer: Medicaid Other | Admitting: Cardiology

## 2014-03-25 ENCOUNTER — Encounter: Payer: Self-pay | Admitting: Cardiology

## 2014-03-25 VITALS — BP 118/68 | HR 86 | Ht 59.0 in | Wt 166.0 lb

## 2014-03-25 DIAGNOSIS — Q210 Ventricular septal defect: Secondary | ICD-10-CM

## 2014-03-25 MED ORDER — AMLODIPINE BESYLATE 2.5 MG PO TABS: 2.5000 mg | ORAL_TABLET | Freq: Every day | ORAL | Status: DC

## 2014-03-25 MED ORDER — FUROSEMIDE 20 MG PO TABS: 20.0000 mg | ORAL_TABLET | Freq: Every day | ORAL | Status: DC

## 2014-03-25 MED ORDER — METOPROLOL TARTRATE 25 MG PO TABS: 25.0000 mg | ORAL_TABLET | Freq: Two times a day (BID) | ORAL | Status: DC

## 2014-03-25 NOTE — Progress Notes (Signed)
[name], female   DOB: Apr 15, 1971, 43 y.o.   MRN: [id number]    Patient Name: Mackenzie Patterson Date of Encounter: 03/25/2014  Primary Care Provider:  [name], MD Primary Cardiologist:  [name]  Problem List   Past Medical History  Diagnosis Date  . Obesity   . Heart murmur   . Congenital ventricular septal defect   . Asthma   . Diabetes mellitus without complication   . VSD (ventricular septal defect), perimembranous     Qp:Qs 1.7:1 by catheterization   . Pulmonary hypertension     mild   . Tricuspid regurgitation    Past Surgical History  Procedure Laterality Date  . Tubal ligation    . Cyst excision     Allergies  Allergies  Allergen Reactions  . Penicillins Anaphylaxis  . 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 The patient is not a good historian and is not sure if she was followed by cardiologists in the past and states that her primary care physician is her heart doctor. She states that 2 years ago she was referred to [facility] where she was offered an option of surgical closure of her VSD or medical management and she decided not to undergo surgery.  In June of 2014 she had a syncopal episode associated with chest pain and underwent a Lexiscan  nuclear stress test that showed normal left ventricular ejection fraction fraction, no scar or ischemia. An echocardiogram at that time showed mildly dilated left ventricle with preserved ejection fraction, membranous VSD not further specified and severe pulmonary hypertension with preserved right ventricular size and function.  She came in March 2015 with palpitations that start after moderate activity and it can last up to 20-30 minutes. At the time she was also symptomatic with 2 pillow orthopnea, PND and progressively worsening dyspnea on exertion  like walking to the mailbox that is about 50 feet from her house will make her short of breath.   She saw cardiothoracic surgeon Dr Roxy Manns who stated:  Impression:  Patient has a small-to-moderate sized perimembranous ventricular septal defect associated with progressive symptoms of chronic diastolic congestive heart failure, currently NYHA functional class III at least. She also experiences atypical chest pain, tachypalpitations, dizzy spells and occasional syncope. Catheterization is notable for the absence of coronary artery disease and only mild pulmonary hypertension. Echocardiogram is notable for the absence of any associated aortic insufficiency, normal left and right ventricular function, and at least moderate tricuspid regurgitation. Options include continued medical therapy versus elective surgical closure with or without possible tricuspid valve repair. Given her progressive symptoms and the development of mild pulmonary hypertension, I would favor proceeding with elective surgery.  Plan:  I discussed options at length with the patient in the office today. We discussed continued medical therapy, elective surgery, or referral to a tertiary care center. I explained that we perform surgical repair on adults with congenital heart disease at [facility], but we do not have a pediatric congenital heart disease program. Although some centers approach closure of perimembranous ventricular septal defects using minimally invasive surgical approach, I would favor a conventional sternotomy. The risks of surgery were discussed at length. All of her questions been addressed. The patient wants to discuss matters with her family and return in 2 or 3 weeks with her father present before making a final  decision.  However, the patient after discussion with her father refused to undergo surgery.   She presented today with worsening symptoms, there is worsening orthopnea, she has to sleep sitting up, PND, dyspnea on minimal  exertion. Her most recent echocardiogram from 03/21/2014 shows progression pf pulmonary hypertension from mild to severe range.   Home Medications  Prior to Admission medications   Medication Sig Start Date End Date Taking? Authorizing Provider  Alogliptin-Metformin HCl (KAZANO) 12.10-998 MG TABS Take 1 tablet by mouth 2 (two) times daily.   Yes Historical Provider, MD  amLODipine (NORVASC) 10 MG tablet Take 5 mg by mouth every morning.    Yes Historical Provider, MD  nitroGLYCERIN (NITROSTAT) 0.4 MG SL tablet Place 0.4 mg under the tongue every 5 (five) minutes as needed for chest pain.   Yes Historical Provider, MD  ondansetron (ZOFRAN) 4 MG tablet Take 1 tablet (4 mg total) by mouth every 6 (six) hours. 06/24/13  Yes Delice Bison Ward, DO    Family History  Family History  Problem Relation Age of Onset  . Cancer Other   . Hyperlipidemia Other   . Hypertension Other   . Asthma Other     Social History  History   Social History  . Marital Status: Single    Spouse Name: [name]    Number of Children: [name]  . Years of Education: [name]   Occupational History  . Not on file.   Social History Main Topics  . Smoking status: Never Smoker   . Smokeless tobacco: Never Used  . Alcohol Use: No  . Drug Use: No  . Sexual Activity: Yes    Birth Control/ Protection: Pill   Other Topics Concern  . Not on file   Social History Narrative  . No narrative on file     Review of Systems, as per HPI, otherwise negative General:  No chills, fever, night sweats or weight changes.  Cardiovascular:  No chest pain, dyspnea on exertion, edema, orthopnea, palpitations, paroxysmal nocturnal dyspnea. Dermatological: No rash, lesions/masses Respiratory: No cough, dyspnea Urologic: No hematuria, dysuria Abdominal:   No nausea, vomiting, diarrhea, bright red blood per rectum, melena, or hematemesis Neurologic:  No visual changes, wkns, changes in mental status. All other systems reviewed and are  otherwise negative except as noted above.  Physical Exam  Blood pressure 118/68, pulse 86, height 4\' 11"  (1.499 m), weight 166 lb (75.297 kg), SpO2 92.00%.  General: Pleasant, NAD Psych: Normal affect. Neuro: Alert and oriented X 3. Moves all extremities spontaneously. HEENT: Normal  Neck: Supple without bruits, JVD + 8 cm Lungs:  Resp regular and unlabored, CTA. Heart: RRR no s3, s4, harsh 5/6 holosystolic murmur at the LLSB. Abdomen: Soft, non-tender, non-distended, BS + x 4.  Extremities: No clubbing, cyanosis or edema. DP/PT/Radials 2+ and equal bilaterally.  Labs:  No results found for this basename: CKTOTAL, CKMB, TROPONINI,  in the last 72 hours Lab Results  Component Value Date   WBC 7.3 01/05/2014   HGB 13.7 01/05/2014   HCT 40.6 01/05/2014   MCV 86.6 01/05/2014   PLT 251 01/05/2014   No results found for this basename: NA, K, CL, CO2, BUN, CREATININE, CALCIUM, LABALBU, PROT, BILITOT, ALKPHOS, ALT, AST, GLUCOSE,  in the last 168 hours Lab Results  Component Value Date   CHOL 149 12/08/2012   HDL 41 12/08/2012   LDLCALC 97 12/08/2012   TRIG 57 12/08/2012   Lab Results  Component Value Date   DDIMER <0.27 01/05/2014  Accessory Clinical Findings  Echocardiogram 08/23/2013  - Left ventricle: The cavity size was mildly dilated. Wall thickness was normal. Systolic function was normal. The estimated ejection fraction was in the range of 60% to 65%. - Left atrium: The atrium was mildly dilated. - Atrial septum: No defect or patent foramen ovale was identified. - Tricuspid valve: Degree of TR and estimation of PA pressure difficult due to contamination of TR jet from VSD Mild-moderate regurgitation. - Pulmonary arteries: PA peak pressure: 21mm Hg (S). QP/QS 2.2 - Pericardium, extracardiac: A trivial pericardial effusion was identified posterior to the heart. - Impressions: Some what large peri membranous VSD. Suggest QP/QS and right heart cath at some point. No  RV/RA dilatation TR velocity contaminated by VSD flow so hard to estimate PA pressure. Elevated EDP by E/E; ratio Impressions:  - Some what large peri membranous VSD. Suggest QP/QS and right heart cath at some point. No RV/RA dilatation TR velocity contaminated by VSD flow so hard to estimate PA pressure. Elevated EDP by E/E; ratio  ECHO: 03/21/2014 Study Conclusions  - Left ventricle: The cavity size was normal. Systolic function was normal. The estimated ejection fraction was in the range of 50% to 55%. Wall motion was normal; there were no regional wall motion abnormalities. Doppler parameters are consistent with abnormal left ventricular relaxation (grade 1 diastolic dysfunction). - Ventricular septum: Ventricular septum: There is a restrictive membranous VSD - peak gradient across the defect is 153 mmHg. - Aortic valve: There was no regurgitation. - Aortic root: The aortic root was normal in size. - Left atrium: The atrium was normal in size. - Right ventricle: Systolic function was normal. - Right atrium: The atrium was mildly dilated. - Tricuspid valve: There was moderate regurgitation. - Pulmonary arteries: Systolic pressure was severely increased. PA peak pressure: 74 mm Hg (S). - Pericardium, extracardiac: A trivial pericardial effusion was identified.  Impressions:  - Compared to the prior study on 5/28/ 2015 there is now moderate tricuspid regurgitation and severe pulmonary hypertension. RVSP 74 mmHg, previously 32 mmHg.   Lexiscan nuclear stress test 12/09/2012  Findings: QGS ejection fraction measures 62% , with an end- diastolic volume of 301 ml and an end-systolic volume of 41 ml.  IMPRESSION: No evidence for pharmacologically induced ischemia.  Normal wall motion and calculated ejection fraction is 62%.  ECG - 05/19/13 SR, LVH, early repolarization in the inferolateral leads  Cath 08/2013 Final Conclusions:  1. Normal coronary arteries  2.  Ventricular septal defect with QP:QS of 1.7  3. Normal right heart pressures with the exception of minimal pulmonary HTN (PVR less than 2 Woods units)  Sherren Mocha  09/11/2013, 9:28 AM   Assessment & Plan  A 43 year old female with   1. Congenital membranous ventricular septal defect. The patient has progressively worsening symptoms of tachypalpitations, DOE, CHF NYHA class III.  She now has worsening pulmonary hypertension from previously mild to severe, RVSP 74 mmHg on echocardiogram on 03/21/2014. Right sided cath  The condition and poor prognosis if left untreated was discussed in length with the patient. She again wants to discuss with her father. She is agreeable to consider percutaneous closure (less ideal as she also needs tricuspid valve repair).  We will refer her to [facility] again for a consultation with Dr Jenne Pane.  I will try to reach her father as he seems to make decisions for her. I am not sure that the patient understands the situation very well.    We will also add  Lasix 20 mg po daily.  2. Palpitations - only sinus tachycardia and PACs on Holter monitor, we will ad metoprolol 25 mg po bid for symptoms improvement  3. Hypertension - controlled on current regimen  4. Syncope - possibly related to severe pulmonary hypertension  Followup in 1 month.  [name], MD, [facility] 03/25/2014, 8:45 AM

## 2014-03-25 NOTE — Patient Instructions (Signed)
Your physician has recommended you make the following change in your medication:   DECREASE YOUR AMLODIPINE TO 2.5 MG DAILY  START TAKING METOPROLOL 25 MG TWICE DAILY  START TAKING LASIX 20 MG DAILY   Your physician recommends that you schedule a follow-up appointment in: [place]

## 2014-04-07 ENCOUNTER — Encounter: Payer: Self-pay | Admitting: Thoracic Surgery (Cardiothoracic Vascular Surgery)

## 2014-04-07 ENCOUNTER — Ambulatory Visit (INDEPENDENT_AMBULATORY_CARE_PROVIDER_SITE_OTHER): Payer: Medicaid Other | Admitting: Thoracic Surgery (Cardiothoracic Vascular Surgery)

## 2014-04-07 VITALS — BP 122/80 | HR 94 | Ht 59.0 in | Wt 166.0 lb

## 2014-04-07 DIAGNOSIS — I272 Pulmonary hypertension, unspecified: Secondary | ICD-10-CM

## 2014-04-07 DIAGNOSIS — I071 Rheumatic tricuspid insufficiency: Secondary | ICD-10-CM

## 2014-04-07 DIAGNOSIS — I270 Primary pulmonary hypertension: Secondary | ICD-10-CM

## 2014-04-07 DIAGNOSIS — Q210 Ventricular septal defect: Secondary | ICD-10-CM

## 2014-04-07 NOTE — Progress Notes (Signed)
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  She was initially diagnosed at [facility] where she followed conservatively without surgical intervention until she turned 44 years old. At one point elective surgical closure was discussed, but a decision was made to continue medical therapy without surgical closure. Since she turned 19 she has been followed locally in [facility] by Dr. Montez Morita.  She states that she first began to experience some symptoms of exertional shortness of breath and atypical chest pain during pregnancy with her twins who are now 42 years old. Over the years she has developed aggressive exertional shortness of breath with decreased exercise tolerance. These symptoms have gotten considerably worse recently, such that the patient get short of breath with relatively mild activity and occasionally at rest. She has frequent symptoms of PND and orthopnea. She has frequent dizzy spells with occasional syncope. She has accelerating symptoms of chest pain which she describes as pressure-like tightness across her chest that is not related to physical exertion or shortness of breath. Chest pain seems to come and goes sporadically. She has frequent palpitations. She has not had lower extremity edema nor abdominal swelling or bloating. She was seen in followup recently by Dr. Meda Coffee performed followup echocardiogram which confirmed the presence of a perimembranous ventricular septal defect with normal left and right ventricular function with at least moderate tricuspid regurgitation and the possibility of significant pulmonary hypertension.  Subsequently she underwent left and right heart catheterization by Dr. Burt Knack. This confirmed the presence of a ventricular septal defect with Qp:Qs measured 1.7:1. There was mild pulmonary hypertension. There was normal coronary artery anatomy with no significant coronary artery disease. The patient was referred for surgical consultation and I had the opportunity to evaluate her on 10/10/2013. At that point we discussed elective repair of ventricular septal defect and possible tricuspid valve repair or replacement. The patient was reluctant to consider surgery but she returned to discuss matters further with her father present on 05/11/205.  After a second lengthy conversation, the patient decided to hold off on surgery at that time.  Since then the patient reports some progression of symptoms of exertional shortness of breath such that now she gets short of breath with very mild activity and occasionally at rest. She continues to have intermittent dizzy spells and near syncopal episodes. She has long-standing atypical chest pain and palpitations. She states that she has had some mild abdominal bloating but she denies any lower extremity edema. She was seen in followup recently by Dr. Meda Coffee who has strongly encourage the patient to consider proceeding with elective surgical intervention.  She has been considering the possibility of seeking a second opinion at [facility], but she returns to our office today to discuss the possibility of having surgery locally in [place].     Current Outpatient Prescriptions  Medication Sig Dispense Refill  . Alogliptin-Metformin HCl (KAZANO) 12.10-998 MG TABS Take 1 tablet by mouth 2 (two) times daily.      Marland Kitchen amLODipine (NORVASC) 2.5 MG tablet Take 1 tablet (2.5 mg total) by mouth daily.  180 tablet  3  . furosemide (LASIX) 20 MG tablet Take 1 tablet (20 mg total) by mouth daily.  90 tablet  6  . metoprolol tartrate (LOPRESSOR) 25 MG tablet Take 1  tablet (25 mg total) by mouth 2 (two) times daily.  180 tablet  6  . prenatal vitamin w/FE, FA (PRENATAL 1 + 1) 27-1 MG TABS tablet Take 1 tablet by mouth daily at 12 noon.      Marland Kitchen albuterol (PROVENTIL HFA;VENTOLIN HFA) 108 (90 BASE) MCG/ACT inhaler Inhale 2 puffs into the lungs every 6 (six) hours as needed for wheezing or shortness of breath.      Marland Kitchen albuterol (PROVENTIL) (2.5 MG/3ML) 0.083% nebulizer solution Take 2.5 mg by nebulization every 6 (six) hours as needed for wheezing or shortness of breath.      Marland Kitchen ibuprofen (ADVIL,MOTRIN) 800 MG tablet Take 1 tablet (800 mg total) by mouth 3 (three) times daily with meals.  21 tablet  0  . nitroGLYCERIN (NITROSTAT) 0.4 MG SL tablet Place 0.4 mg under the tongue every 5 (five) minutes as needed for chest pain.      . traMADol (ULTRAM) 50 MG tablet Take 1 tablet (50 mg total) by mouth every 6 (six) hours as needed.  10 tablet  0  . valACYclovir (VALTREX) 500 MG tablet Take 500 mg by mouth daily.       No current facility-administered medications for this visit.      Physical Exam:   BP 122/80  Pulse 94  Ht 4\' 11"  (1.499 m)  Wt 166 lb (75.297 kg)  BMI 33.51 kg/m2  SpO2 95%  General:  Well-appearing  Chest:   Clear  CV:   Regular rate and rhythm with prominent holosystolic murmur  Incisions:  n/a  Abdomen:  Soft and nondistended  Extremities:  Warm and well-perfused, and no lower extremity edema  Diagnostic Tests:  Transthoracic Echocardiography  Patient: Petrea, Fredenburg MR #: [id number] Study Date: 03/21/2014 Gender: F Age: 67 Height: 149 cm Weight: 73.5 kg BSA: 1.78 m^2 Pt. Status: Room:  ATTENDING [name] ORDERING Spruill, Ardyth Gal REFERRING Spruill, Ardyth Gal SONOGRAPHER Melissa Morford, RDCS PERFORMING [facility], Outpatient  cc:  ------------------------------------------------------------------- LV EF: 50% - 55%  ------------------------------------------------------------------- Indications: Ventricular septal  defect 429.71.  ------------------------------------------------------------------- Study Conclusions  - Left ventricle: The cavity size was normal. Systolic function was normal. The estimated ejection fraction was in the range of 50% to 55%. Wall motion was normal; there were no regional wall motion abnormalities. Doppler parameters are consistent with abnormal left ventricular relaxation (grade 1 diastolic dysfunction). - Ventricular septum: Ventricular septum: There is a restrictive membranous VSD - peak gradient across the defect is 153 mmHg. - Aortic valve: There was no regurgitation. - Aortic root: The aortic root was normal in size. - Left atrium: The atrium was normal in size. - Right ventricle: Systolic function was normal. - Right atrium: The atrium was mildly dilated. - Tricuspid valve: There was moderate regurgitation. - Pulmonary arteries: Systolic pressure was severely increased. PA peak pressure: 74 mm Hg (S). - Pericardium, extracardiac: A trivial pericardial effusion was identified.  Impressions:  - Compared to the prior study on 5/28/ 2015 there is now moderate tricuspid regurgitation and severe pulmonary hypertension. RVSP 74 mmHg, previously 32 mmHg.  Transthoracic echocardiography. M-mode, complete 2D, spectral Doppler, and color Doppler. Birthdate: Patient birthdate: April 17, 1971. Age: Patient is 43 yr old. Sex: Gender: female. BMI: 33.1 kg/m^2. Blood pressure: 115/73 Patient status: Outpatient. Study date: Study  date: 03/21/2014. Study time: 01:59 PM. Location: Echo laboratory.  -------------------------------------------------------------------  ------------------------------------------------------------------- Left ventricle: The cavity size was normal. Systolic function was normal. The estimated ejection fraction was in the range of 50% to 55%. Wall motion was normal; there were no regional wall motion abnormalities. Doppler parameters are  consistent with abnormal left ventricular relaxation (grade 1 diastolic dysfunction).  ------------------------------------------------------------------- Aortic valve: Trileaflet; normal thickness leaflets. Mobility was not restricted. Doppler: Transvalvular velocity was within the normal range. There was no stenosis. There was no regurgitation.  ------------------------------------------------------------------- Aorta: Aortic root: The aortic root was normal in size.  ------------------------------------------------------------------- Mitral valve: Structurally normal valve. Mobility was not restricted. Doppler: Transvalvular velocity was within the normal range. There was no evidence for stenosis. There was no regurgitation. Peak gradient (D): 6 mm Hg.  ------------------------------------------------------------------- Left atrium: The atrium was normal in size.  ------------------------------------------------------------------- Right ventricle: The cavity size was normal. Wall thickness was normal. Systolic function was normal.  ------------------------------------------------------------------- Ventricular septum: Ventricular septum: There is a restrictive membranous VSD - peak gradient across the defect is 153 mmHg.  ------------------------------------------------------------------- Pulmonic valve: Structurally normal valve. Cusp separation was normal. Doppler: Transvalvular velocity was within the normal range. There was no evidence for stenosis. There was no regurgitation.  ------------------------------------------------------------------- Tricuspid valve: Structurally normal valve. Doppler: Transvalvular velocity was within the normal range. There was moderate regurgitation.  ------------------------------------------------------------------- Pulmonary artery: The main pulmonary artery was normal-sized. Systolic pressure was severely  increased.  ------------------------------------------------------------------- Right atrium: The atrium was mildly dilated.  ------------------------------------------------------------------- Pericardium: A trivial pericardial effusion was identified.  ------------------------------------------------------------------- Systemic veins: Inferior vena cava: The vessel was normal in size.  ------------------------------------------------------------------- Measurements  Left ventricle Value Reference LV ID, ED, PLAX chordal 47.7 mm 43 - 52 LV ID, ES, PLAX chordal 34.7 mm 23 - 38 LV fx shortening, PLAX chordal (L) 27 % >=29 LV PW thickness, ED 9 mm --------- IVS/LV PW ratio, ED 0.71 <=1.3 LV e&', lateral 11.2 cm/s --------- LV E/e&', lateral 10.89 ---------  Ventricular septum Value Reference IVS thickness, ED 6.35 mm ---------  Aorta Value Reference Aortic root ID, ED 25 mm ---------  Left atrium Value Reference LA ID, A-P, ES 35 mm --------- LA ID/bsa, A-P 1.97 cm/m^2 <=2.2 LA volume, ES, 1-p A4C 59.4 ml --------- LA volume/bsa, ES, 1-p A4C 33.4 ml/m^2 --------- LA volume, ES, 1-p A2C 64.1 ml --------- LA volume/bsa, ES, 1-p A2C 36 ml/m^2 ---------  Mitral valve Value Reference Mitral E-wave peak velocity 122 cm/s --------- Mitral A-wave peak velocity 108 cm/s --------- Mitral deceleration time (L) 134 ms 150 - 230 Mitral peak gradient, D 6 mm Hg --------- Mitral E/A ratio, peak 1.1 ---------  Pulmonary arteries Value Reference PA pressure, S, DP (H) 74 mm Hg <=30  Tricuspid valve Value Reference Tricuspid regurg peak velocity 421 cm/s --------- Tricuspid peak RV-RA gradient 71 mm Hg ---------  Systemic veins Value Reference Estimated CVP 3 mm Hg ---------  Right ventricle Value Reference RV pressure, S, DP (H) 74 mm Hg <=30  Legend: (L) and (H) mark values outside specified reference  range.  ------------------------------------------------------------------- Prepared and Electronically Authenticated by  Ena Dawley, M.D. 2015-09-25T17:27:02    Impression:  Patient has a small-to-moderate sized perimembranous ventricular septal defect associated with progressive symptoms of chronic diastolic congestive heart failure, currently NYHA functional class III at least. She also experiences atypical chest pain, tachypalpitations, dizzy spells and occasional syncope. Catheterization is notable for the absence of coronary artery disease and only mild pulmonary hypertension. Echocardiogram is notable for normal  left and right ventricular function, and at least moderate tricuspid regurgitation, and the absence of any associated aortic insufficiency. Options include continued medical therapy versus elective surgical closure with or without possible tricuspid valve repair. Given her progressive symptoms and the development of mild pulmonary hypertension, I would favor proceeding with elective surgery. Although some centers approach closure of perimembranous ventricular septal defects using minimally invasive surgical approach, I would favor a conventional sternotomy.     Plan:  I have again reviewed the indications, risks, and potential benefits of surgical closure of her ventricular septal defect with possible tricuspid valve repair or replacement with the patient and her aunt in the office today.  The patient's parents did not accompany her to the office for consultation today. The patient is interested in proceeding with surgery as soon as practical. We discussed risks associated with surgery as well as expectations for her postoperative convalescence. We tentatively plan to proceed with surgery on Thursday, 04/17/2014. The patient will return for followup on Monday, 04/14/2014 with her parents present to further discuss the risks and benefits of surgery. All of her questions have been  answered.  I spent in excess of 15 minutes during the conduct of this office consultation and >50% of this time involved direct face-to-face encounter with the patient for counseling and/or coordination of their care.   Valentina Gu. Roxy Manns, MD 04/07/2014 5:08 PM

## 2014-04-08 ENCOUNTER — Encounter (HOSPITAL_COMMUNITY): Payer: Self-pay | Admitting: Pharmacy Technician

## 2014-04-08 ENCOUNTER — Other Ambulatory Visit: Payer: Self-pay

## 2014-04-08 DIAGNOSIS — I071 Rheumatic tricuspid insufficiency: Secondary | ICD-10-CM

## 2014-04-08 DIAGNOSIS — Q210 Ventricular septal defect: Secondary | ICD-10-CM

## 2014-04-11 ENCOUNTER — Other Ambulatory Visit (HOSPITAL_COMMUNITY): Payer: Self-pay | Admitting: *Deleted

## 2014-04-11 ENCOUNTER — Telehealth: Payer: Self-pay

## 2014-04-11 NOTE — Telephone Encounter (Signed)
Patient called to cancel surgery on 04/17/14 with Dr Roxy Manns and pre-op appt's on 04/14/14. She is going to talk to someone at [facility].

## 2014-04-14 ENCOUNTER — Ambulatory Visit (INDEPENDENT_AMBULATORY_CARE_PROVIDER_SITE_OTHER): Payer: Medicaid Other | Admitting: Thoracic Surgery (Cardiothoracic Vascular Surgery)

## 2014-04-14 ENCOUNTER — Encounter: Payer: Self-pay | Admitting: Thoracic Surgery (Cardiothoracic Vascular Surgery)

## 2014-04-14 ENCOUNTER — Ambulatory Visit: Payer: Medicaid Other | Admitting: Thoracic Surgery (Cardiothoracic Vascular Surgery)

## 2014-04-14 ENCOUNTER — Inpatient Hospital Stay (HOSPITAL_COMMUNITY): Admission: RE | Admit: 2014-04-14 | Payer: Medicaid Other | Source: Ambulatory Visit

## 2014-04-14 ENCOUNTER — Encounter (HOSPITAL_COMMUNITY): Payer: Medicaid Other

## 2014-04-14 ENCOUNTER — Ambulatory Visit (HOSPITAL_COMMUNITY): Admission: RE | Admit: 2014-04-14 | Payer: Medicaid Other | Source: Ambulatory Visit

## 2014-04-14 VITALS — BP 127/74 | HR 93 | Ht 59.0 in | Wt 166.0 lb

## 2014-04-14 DIAGNOSIS — Q210 Ventricular septal defect: Secondary | ICD-10-CM

## 2014-04-14 NOTE — Progress Notes (Signed)
[address]       [place] [address]             [phone]     CARDIOTHORACIC SURGERY OFFICE NOTE  Referring Provider is [name], MD PCP is [name], MD   HPI:  Patient returns to the office today for further followup related to her perimembranous ventricular septal defect and tricuspid regurgitation.  She was seen in the office last week and at that time and made plans to proceed with surgery later this week. However, since her last office visit the patient has decided to postpone plans for surgery, and she states that she has now scheduled an appointment for consultation at [facility].  She returns to the office today where both of her parents and several other family members. She reports no new problems or complaints.   Current Outpatient Prescriptions  Medication Sig Dispense Refill  . Alogliptin-Metformin HCl (KAZANO) 12.10-998 MG TABS Take 1 tablet by mouth daily.       Marland Kitchen amLODipine (NORVASC) 2.5 MG tablet Take 1 tablet (2.5 mg total) by mouth daily.  180 tablet  3  . furosemide (LASIX) 20 MG tablet Take 1 tablet (20 mg total) by mouth daily.  90 tablet  6  . metoprolol tartrate (LOPRESSOR) 25 MG tablet Take 1 tablet (25 mg total) by mouth 2 (two) times daily.  180 tablet  6  . albuterol (PROVENTIL) (2.5 MG/3ML) 0.083% nebulizer solution Take 2.5 mg by nebulization every 6 (six) hours as needed for wheezing or shortness of breath.      . nitroGLYCERIN (NITROSTAT) 0.4 MG SL tablet Place 0.4 mg under the tongue every 5 (five) minutes as needed for chest pain.      . valACYclovir (VALTREX) 500 MG tablet Take 500 mg by mouth daily.       No current facility-administered medications for this visit.      Physical Exam:   BP 127/74  Pulse 93  Ht 4\' 11"  (1.499 m)  Wt 166 lb (75.297 kg)  BMI 33.51 kg/m2  SpO2 99%  General:  Well-appearing  Chest:   Clear  CV:   Regular rate and rhythm with holosystolic  murmur  Incisions:  n/a  Abdomen:  Soft  Extremities:  Warm  Diagnostic Tests:  n/a   Impression:  Patient has a small-to-moderate sized perimembranous ventricular septal defect associated with progressive symptoms of chronic diastolic congestive heart failure, currently NYHA functional class III at least. She also experiences atypical chest pain, tachypalpitations, dizzy spells and occasional syncope. Catheterization is notable for the absence of coronary artery disease and only mild pulmonary hypertension. Echocardiogram is notable for normal left and right ventricular function, and at least moderate tricuspid regurgitation, and the absence of any associated aortic insufficiency. Options include continued medical therapy versus elective surgical closure with or without possible tricuspid valve repair. Given her progressive symptoms and the development of mild pulmonary hypertension, I would favor proceeding with elective surgery. Although some centers approach closure of perimembranous ventricular septal defects using minimally invasive surgical approach, I would favor a conventional sternotomy.   Plan:  I spent in excess of 15 minutes reviewing the indications, risks, and potential benefits of elective closure of ventricular septal defect with tricuspid valve repair. The rationale for surgical intervention was discussed in contrast with continued medical therapy and close followup. Alternative surgical approaches were discussed. All of their questions been addressed. The patient plans to seek  a second opinion at [facility]. She will call and return to see [name] here in the future as needed.  I spent in excess of 15 minutes during the conduct of this office consultation and >50% of this time involved direct face-to-face encounter with the patient for counseling and/or coordination of their care.   Valentina Gu. Roxy Manns, MD 04/14/2014 5:08 PM

## 2014-04-17 ENCOUNTER — Encounter (HOSPITAL_COMMUNITY): Admission: RE | Payer: Self-pay | Source: Ambulatory Visit

## 2014-04-17 ENCOUNTER — Inpatient Hospital Stay (HOSPITAL_COMMUNITY)
Admission: RE | Admit: 2014-04-17 | Payer: Medicaid Other | Source: Ambulatory Visit | Admitting: Thoracic Surgery (Cardiothoracic Vascular Surgery)

## 2014-04-17 SURGERY — REPAIR, VENTRICULAR SEPTAL DEFECT
Anesthesia: General | Site: Chest

## 2014-04-25 ENCOUNTER — Ambulatory Visit: Payer: Medicaid Other | Admitting: Cardiology

## 2014-05-12 ENCOUNTER — Ambulatory Visit: Payer: Medicaid Other | Admitting: Thoracic Surgery (Cardiothoracic Vascular Surgery)

## 2014-06-01 ENCOUNTER — Ambulatory Visit (HOSPITAL_BASED_OUTPATIENT_CLINIC_OR_DEPARTMENT_OTHER): Payer: Medicaid Other

## 2014-06-03 ENCOUNTER — Encounter (HOSPITAL_BASED_OUTPATIENT_CLINIC_OR_DEPARTMENT_OTHER): Payer: Medicaid Other

## 2014-06-05 ENCOUNTER — Encounter (HOSPITAL_COMMUNITY): Payer: Self-pay | Admitting: Cardiovascular Disease

## 2014-06-13 ENCOUNTER — Encounter (HOSPITAL_COMMUNITY): Payer: Self-pay | Admitting: *Deleted

## 2014-06-13 ENCOUNTER — Emergency Department (HOSPITAL_COMMUNITY)
Admission: EM | Admit: 2014-06-13 | Discharge: 2014-06-13 | Disposition: A | Discharge status: Home / Self Care | Payer: Medicaid Other | Attending: Emergency Medicine | Admitting: Emergency Medicine

## 2014-06-13 DIAGNOSIS — Z9889 Other specified postprocedural states: Secondary | ICD-10-CM | POA: Insufficient documentation

## 2014-06-13 DIAGNOSIS — Z880 Allergy status to penicillin: Secondary | ICD-10-CM | POA: Diagnosis not present

## 2014-06-13 DIAGNOSIS — J45909 Unspecified asthma, uncomplicated: Secondary | ICD-10-CM | POA: Diagnosis not present

## 2014-06-13 DIAGNOSIS — J069 Acute upper respiratory infection, unspecified: Secondary | ICD-10-CM | POA: Insufficient documentation

## 2014-06-13 DIAGNOSIS — Z8774 Personal history of (corrected) congenital malformations of heart and circulatory system: Secondary | ICD-10-CM | POA: Insufficient documentation

## 2014-06-13 DIAGNOSIS — Z79899 Other long term (current) drug therapy: Secondary | ICD-10-CM | POA: Insufficient documentation

## 2014-06-13 DIAGNOSIS — E669 Obesity, unspecified: Secondary | ICD-10-CM | POA: Insufficient documentation

## 2014-06-13 DIAGNOSIS — Z8679 Personal history of other diseases of the circulatory system: Secondary | ICD-10-CM | POA: Insufficient documentation

## 2014-06-13 DIAGNOSIS — R011 Cardiac murmur, unspecified: Secondary | ICD-10-CM | POA: Diagnosis not present

## 2014-06-13 DIAGNOSIS — Z91040 Latex allergy status: Secondary | ICD-10-CM | POA: Insufficient documentation

## 2014-06-13 DIAGNOSIS — E119 Type 2 diabetes mellitus without complications: Secondary | ICD-10-CM | POA: Diagnosis not present

## 2014-06-13 DIAGNOSIS — J029 Acute pharyngitis, unspecified: Secondary | ICD-10-CM | POA: Diagnosis present

## 2014-06-13 NOTE — Discharge Instructions (Signed)
Please apply warm moist compress to your left eyelid several times daily for the next few days to help with the swelling.  Follow instruction below.   Upper Respiratory Infection, Adult An upper respiratory infection (URI) is also known as the common cold. It is often caused by a type of germ (virus). Colds are easily spread (contagious). You can pass it to others by kissing, coughing, sneezing, or drinking out of the same glass. Usually, you get better in 1 or 2 weeks.  HOME CARE   Only take medicine as told by your doctor.  Use a warm mist humidifier or breathe in steam from a hot shower.  Drink enough water and fluids to keep your pee (urine) clear or pale yellow.  Get plenty of rest.  Return to work when your temperature is back to normal or as told by your doctor. You may use a face mask and wash your hands to stop your cold from spreading. GET HELP RIGHT AWAY IF:   After the first few days, you feel you are getting worse.  You have questions about your medicine.  You have chills, shortness of breath, or brown or red spit (mucus).  You have yellow or brown snot (nasal discharge) or pain in the face, especially when you bend forward.  You have a fever, puffy (swollen) neck, pain when you swallow, or white spots in the back of your throat.  You have a bad headache, ear pain, sinus pain, or chest pain.  You have a high-pitched whistling sound when you breathe in and out (wheezing).  You have a lasting cough or cough up blood.  You have sore muscles or a stiff neck. MAKE SURE YOU:   Understand these instructions.  Will watch your condition.  Will get help right away if you are not doing well or get worse. Document Released: 11/30/2007 Document Revised: 09/05/2011 Document Reviewed: 09/18/2013 [facility] Patient Information 2015 [place], [place]. This information is not intended to replace advice given to you by your health care provider. Make sure you discuss any questions you  have with your health care provider.

## 2014-06-13 NOTE — ED Notes (Signed)
Pt escorted to discharge window. Pt verbalized understanding discharge instructions. In no acute distress.  

## 2014-06-13 NOTE — ED Provider Notes (Signed)
CSN: [id number]     Arrival date & time 06/13/14  1008 History   First MD Initiated Contact with Patient 06/13/14 1024     Chief Complaint  Patient presents with  . eye swelling   . Sore Throat     (Consider location/radiation/quality/duration/timing/severity/associated sxs/prior Treatment) HPI   43 year old female with history of asthma and diabetes presents with URI symptoms. Patient states this morning she noticed that her left eyelid was swollen, she also complaining of sore throat, sneezing and mild congestion. No fever, headache, shortness of breath, abdominal cramping, vomiting or diarrhea. Recent sick contact, with friend with similar symptoms. She describes some over-the-counter cough medication this morning but no other specific treatment. She denies any vision changes.  Past Medical History  Diagnosis Date  . Obesity   . Heart murmur   . Congenital ventricular septal defect   . Asthma   . Diabetes mellitus without complication   . VSD (ventricular septal defect), perimembranous     Qp:Qs 1.7:1 by catheterization   . Pulmonary hypertension     mild   . Tricuspid regurgitation    Past Surgical History  Procedure Laterality Date  . Tubal ligation    . Cyst excision    . Left and right heart catheterization with coronary angiogram [name] 09/11/2013    Procedure: LEFT AND RIGHT HEART CATHETERIZATION WITH CORONARY ANGIOGRAM;  Surgeon: [name], MD;  Location: [facility] CATH LAB;  Service: Cardiovascular;  Laterality: [name];   Family History  Problem Relation Age of Onset  . Cancer Other   . Hyperlipidemia Other   . Hypertension Other   . Asthma Other    History  Substance Use Topics  . Smoking status: Never Smoker   . Smokeless tobacco: Never Used  . Alcohol Use: No   OB History    No data available     Review of Systems  Constitutional: Negative for fever.  HENT: Positive for sore throat.   Eyes: Positive for itching. Negative for pain.  Neurological:  Negative for headaches.      Allergies  Penicillins; Latex; and Other  Home Medications   Prior to Admission medications   Medication Sig Start Date End Date Taking? Authorizing Provider  albuterol (PROVENTIL) (2.5 MG/3ML) 0.083% nebulizer solution Take 2.5 mg by nebulization every 6 (six) hours as needed for wheezing or shortness of breath.    Historical Provider, MD  Alogliptin-Metformin HCl (KAZANO) 12.10-998 MG TABS Take 1 tablet by mouth daily.     Historical Provider, MD  amLODipine (NORVASC) 2.5 MG tablet Take 1 tablet (2.5 mg total) by mouth daily. 03/25/14   [name], MD  furosemide (LASIX) 20 MG tablet Take 1 tablet (20 mg total) by mouth daily. 03/25/14   [name], MD  metoprolol tartrate (LOPRESSOR) 25 MG tablet Take 1 tablet (25 mg total) by mouth 2 (two) times daily. 03/25/14   [name], MD  nitroGLYCERIN (NITROSTAT) 0.4 MG SL tablet Place 0.4 mg under the tongue every 5 (five) minutes as needed for chest pain.    Historical Provider, MD  valACYclovir (VALTREX) 500 MG tablet Take 500 mg by mouth daily.    Historical Provider, MD   BP 118/78 mmHg  Pulse 92  Temp(Src) 98.4 F (36.9 C) (Oral)  Resp 16  SpO2 99%  LMP 06/12/2014 Physical Exam  Constitutional: She is oriented to person, place, and time. She appears well-developed and well-nourished. No distress.  HENT:  Head: Atraumatic.  Eyes: Conjunctivae and  EOM are normal. Pupils are equal, round, and reactive to light. Right eye exhibits no discharge. Left eye exhibits no discharge. No scleral icterus.  Left upper eyelid is mildly edematous, no chalazion or sty noted.  Lid everted, no fb noted.    Neck: Normal range of motion. Neck supple.  No nuchal rigidity  Cardiovascular: Normal rate and regular rhythm.   Murmur heard. Pulmonary/Chest: Effort normal and breath sounds normal. She has no wheezes. She has no rales.  Abdominal: Soft. There is no tenderness.  Neurological: She is alert and  oriented to person, place, and time.  Skin: No rash noted.  Psychiatric: She has a normal mood and affect.  Nursing note and vitals reviewed.   ED Course  Procedures (including critical care time) Labs Review Labs Reviewed - No data to display  Imaging Review No results found.   EKG Interpretation None      MDM   Final diagnoses:  URI (upper respiratory infection)    BP 118/78 mmHg  Pulse 92  Temp(Src) 98.4 F (36.9 C) (Oral)  Resp 16  SpO2 99%  LMP 06/12/2014     Domenic Moras, PA-C 06/13/14 [place], MD 06/13/14 1524

## 2014-06-13 NOTE — ED Notes (Signed)
Pt reports left eye swelling, and sore throat. Both started this morning. Denies pain. Reports soreness.

## 2014-06-17 ENCOUNTER — Emergency Department (HOSPITAL_COMMUNITY)
Admission: EM | Admit: 2014-06-17 | Discharge: 2014-06-17 | Disposition: A | Discharge status: Home / Self Care | Payer: Medicaid Other | Attending: Emergency Medicine | Admitting: Emergency Medicine

## 2014-06-17 ENCOUNTER — Encounter (HOSPITAL_COMMUNITY): Payer: Self-pay | Admitting: Emergency Medicine

## 2014-06-17 DIAGNOSIS — M79671 Pain in right foot: Secondary | ICD-10-CM | POA: Insufficient documentation

## 2014-06-17 DIAGNOSIS — Z8679 Personal history of other diseases of the circulatory system: Secondary | ICD-10-CM | POA: Diagnosis not present

## 2014-06-17 DIAGNOSIS — J45909 Unspecified asthma, uncomplicated: Secondary | ICD-10-CM | POA: Insufficient documentation

## 2014-06-17 DIAGNOSIS — Z79899 Other long term (current) drug therapy: Secondary | ICD-10-CM | POA: Diagnosis not present

## 2014-06-17 DIAGNOSIS — Z9889 Other specified postprocedural states: Secondary | ICD-10-CM | POA: Insufficient documentation

## 2014-06-17 DIAGNOSIS — Z91040 Latex allergy status: Secondary | ICD-10-CM | POA: Diagnosis not present

## 2014-06-17 DIAGNOSIS — E119 Type 2 diabetes mellitus without complications: Secondary | ICD-10-CM | POA: Insufficient documentation

## 2014-06-17 DIAGNOSIS — R011 Cardiac murmur, unspecified: Secondary | ICD-10-CM | POA: Insufficient documentation

## 2014-06-17 DIAGNOSIS — Z8774 Personal history of (corrected) congenital malformations of heart and circulatory system: Secondary | ICD-10-CM | POA: Diagnosis not present

## 2014-06-17 DIAGNOSIS — Z880 Allergy status to penicillin: Secondary | ICD-10-CM | POA: Diagnosis not present

## 2014-06-17 DIAGNOSIS — M25571 Pain in right ankle and joints of right foot: Secondary | ICD-10-CM | POA: Diagnosis not present

## 2014-06-17 DIAGNOSIS — E669 Obesity, unspecified: Secondary | ICD-10-CM | POA: Insufficient documentation

## 2014-06-17 MED ORDER — HYDROCODONE-ACETAMINOPHEN 5-325 MG PO TABS: 2.0000 | ORAL_TABLET | Freq: Once | ORAL | Status: DC

## 2014-06-17 MED ORDER — IBUPROFEN 800 MG PO TABS: 800.0000 mg | ORAL_TABLET | Freq: Three times a day (TID) | ORAL | Status: DC | PRN

## 2014-06-17 MED ORDER — IBUPROFEN 800 MG PO TABS: 800.0000 mg | ORAL_TABLET | Freq: Once | ORAL | Status: DC

## 2014-06-17 MED ORDER — HYDROCODONE-ACETAMINOPHEN 5-325 MG PO TABS: 1.0000 | ORAL_TABLET | ORAL | Status: DC | PRN

## 2014-06-17 NOTE — Discharge Instructions (Signed)
Ankle Pain Ankle pain is a common symptom. The bones, cartilage, tendons, and muscles of the ankle joint perform a lot of work each day. The ankle joint holds your body weight and allows you to move around. Ankle pain can occur on either side or back of 1 or both ankles. Ankle pain may be sharp and burning or dull and aching. There may be tenderness, stiffness, redness, or warmth around the ankle. The pain occurs more often when a person walks or puts pressure on the ankle. CAUSES  There are many reasons ankle pain can develop. It is important to work with your caregiver to identify the cause since many conditions can impact the bones, cartilage, muscles, and tendons. Causes for ankle pain include:  Injury, including a break (fracture), sprain, or strain often due to a fall, sports, or a high-impact activity.  Swelling (inflammation) of a tendon (tendonitis).  Achilles tendon rupture.  Ankle instability after repeated sprains and strains.  Poor foot alignment.  Pressure on a nerve (tarsal tunnel syndrome).  Arthritis in the ankle or the lining of the ankle.  Crystal formation in the ankle (gout or pseudogout). DIAGNOSIS  A diagnosis is based on your medical history, your symptoms, results of your physical exam, and results of diagnostic tests. Diagnostic tests may include X-ray exams or a computerized magnetic scan (magnetic resonance imaging, MRI). TREATMENT  Treatment will depend on the cause of your ankle pain and may include:  Keeping pressure off the ankle and limiting activities.  Using crutches or other walking support (a cane or brace).  Using rest, ice, compression, and elevation.  Participating in physical therapy or home exercises.  Wearing shoe inserts or special shoes.  Losing weight.  Taking medications to reduce pain or swelling or receiving an injection.  Undergoing surgery. HOME CARE INSTRUCTIONS   Only take over-the-counter or prescription medicines for  pain, discomfort, or fever as directed by your caregiver.  Put ice on the injured area.  Put ice in a plastic bag.  Place a towel between your skin and the bag.  Leave the ice on for 15-20 minutes at a time, 03-04 times a day.  Keep your leg raised (elevated) when possible to lessen swelling.  Avoid activities that cause ankle pain.  Follow specific exercises as directed by your caregiver.  Record how often you have ankle pain, the location of the pain, and what it feels like. This information may be helpful to you and your caregiver.  Ask your caregiver about returning to work or sports and whether you should drive.  Follow up with your caregiver for further examination, therapy, or testing as directed. SEEK MEDICAL CARE IF:   Pain or swelling continues or worsens beyond 1 week.  You have an oral temperature above 102 F (38.9 C).  You are feeling unwell or have chills.  You are having an increasingly difficult time with walking.  You have loss of sensation or other new symptoms.  You have questions or concerns. MAKE SURE YOU:   Understand these instructions.  Will watch your condition.  Will get help right away if you are not doing well or get worse. Document Released: 12/01/2009 Document Revised: 09/05/2011 Document Reviewed: 12/01/2009 [facility] Patient Information 2015 [place], [place]. This information is not intended to replace advice given to you by your health care provider. Make sure you discuss any questions you have with your health care provider.   Possible Tendinitis Tendinitis is swelling and inflammation of the tendons. Tendons are  band-like tissues that connect muscle to bone. Tendinitis commonly occurs in the:   Shoulders (rotator cuff).  Heels (Achilles tendon).  Elbows (triceps tendon). CAUSES Tendinitis is usually caused by overusing the tendon, muscles, and joints involved. When the tissue surrounding a tendon (synovium) becomes inflamed, it  is called tenosynovitis. Tendinitis commonly develops in people whose jobs require repetitive motions. SYMPTOMS  Pain.  Tenderness.  Mild swelling. DIAGNOSIS Tendinitis is usually diagnosed by physical exam. Your health care provider may also order X-rays or other imaging tests. TREATMENT Your health care provider may recommend certain medicines or exercises for your treatment. HOME CARE INSTRUCTIONS   Use a sling or splint for as long as directed by your health care provider until the pain decreases.  Put ice on the injured area.  Put ice in a plastic bag.  Place a towel between your skin and the bag.  Leave the ice on for 15-20 minutes, 3-4 times a day, or as directed by your health care provider.  Avoid using the limb while the tendon is painful. Perform gentle range of motion exercises only as directed by your health care provider. Stop exercises if pain or discomfort increase, unless directed otherwise by your health care provider.  Only take over-the-counter or prescription medicines for pain, discomfort, or fever as directed by your health care provider. SEEK MEDICAL CARE IF:   Your pain and swelling increase.  You develop new, unexplained symptoms, especially increased numbness in the hands. MAKE SURE YOU:   Understand these instructions.  Will watch your condition.  Will get help right away if you are not doing well or get worse. Document Released: 06/10/2000 Document Revised: 10/28/2013 Document Reviewed: 08/30/2010 [facility] Patient Information 2015 [place], [place]. This information is not intended to replace advice given to you by your health care provider. Make sure you discuss any questions you have with your health care provider.    RICE: Routine Care for Injuries The routine care of many injuries includes Rest, Ice, Compression, and Elevation (RICE). HOME CARE INSTRUCTIONS  Rest is needed to allow your body to heal. Routine activities can usually be  resumed when comfortable. Injured tendons and bones can take up to 6 weeks to heal. Tendons are the cord-like structures that attach muscle to bone.  Ice following an injury helps keep the swelling down and reduces pain.  Put ice in a plastic bag.  Place a towel between your skin and the bag.  Leave the ice on for 15-20 minutes, 3-4 times a day, or as directed by your health care provider. Do this while awake, for the first 24 to 48 hours. After that, continue as directed by your caregiver.  Compression helps keep swelling down. It also gives support and helps with discomfort. If an elastic bandage has been applied, it should be removed and reapplied every 3 to 4 hours. It should not be applied tightly, but firmly enough to keep swelling down. Watch fingers or toes for swelling, bluish discoloration, coldness, numbness, or excessive pain. If any of these problems occur, remove the bandage and reapply loosely. Contact your caregiver if these problems continue.  Elevation helps reduce swelling and decreases pain. With extremities, such as the arms, hands, legs, and feet, the injured area should be placed near or above the level of the heart, if possible. SEEK IMMEDIATE MEDICAL CARE IF:  You have persistent pain and swelling.  You develop redness, numbness, or unexpected weakness.  Your symptoms are getting worse rather than improving after  several days. These symptoms may indicate that further evaluation or further X-rays are needed. Sometimes, X-rays may not show a small broken bone (fracture) until 1 week or 10 days later. Make a follow-up appointment with your caregiver. Ask when your X-ray results will be ready. Make sure you get your X-ray results. Document Released: 09/25/2000 Document Revised: 06/18/2013 Document Reviewed: 11/12/2010 [employer] Patient Information 2015 [place], [place]. This information is not intended to replace advice given to you by your health care provider. Make sure you  discuss any questions you have with your health care provider.

## 2014-06-17 NOTE — ED Notes (Signed)
Pt states that she injured her rt ankle 19 years ago and it still hurts.  No new injury.  States that it hurts her rt hip because she limps.

## 2014-06-17 NOTE — ED Provider Notes (Signed)
TIME SEEN: 9:00 AM  CHIEF COMPLAINT: Right ankle pain, right hip pain  HPI: Pt is a 42 y.o. F with history of congenital ventricular septal defect, diabetes who presents to the emergency department with complaints of left foot and ankle pain. States that she injured her ankle 19 years ago and will intermittently have pain especially after wearing certain shoes. Reports that she wore her mother's boots with a heel yesterday and this exacerbated her pain. Denies any new injury. No numbness, tingling or focal weakness. States that she will have some intermittent pain in her right hip because of how she is having ambulate secondary to pain. States she's been using a cane. She is able to bear weight but walks with a limp. Denies fever.  ROS: See HPI Constitutional: no fever  Eyes: no drainage  ENT: no runny nose   Cardiovascular:  no chest pain  Resp: no SOB  GI: no vomiting GU: no dysuria Integumentary: no rash  Allergy: no hives  Musculoskeletal: no leg swelling  Neurological: no slurred speech ROS otherwise negative  PAST MEDICAL HISTORY/PAST SURGICAL HISTORY:  Past Medical History  Diagnosis Date  . Obesity   . Heart murmur   . Congenital ventricular septal defect   . Asthma   . Diabetes mellitus without complication   . VSD (ventricular septal defect), perimembranous     Qp:Qs 1.7:1 by catheterization   . Pulmonary hypertension     mild   . Tricuspid regurgitation     MEDICATIONS:  Prior to Admission medications   Medication Sig Start Date End Date Taking? Authorizing Provider  albuterol (PROVENTIL) (2.5 MG/3ML) 0.083% nebulizer solution Take 2.5 mg by nebulization every 6 (six) hours as needed for wheezing or shortness of breath.    Historical Provider, MD  Alogliptin-Metformin HCl (KAZANO) 12.10-998 MG TABS Take 1 tablet by mouth daily.     Historical Provider, MD  amLODipine (NORVASC) 2.5 MG tablet Take 1 tablet (2.5 mg total) by mouth daily. 03/25/14   [name], MD   furosemide (LASIX) 20 MG tablet Take 1 tablet (20 mg total) by mouth daily. 03/25/14   [name], MD  metoprolol tartrate (LOPRESSOR) 25 MG tablet Take 1 tablet (25 mg total) by mouth 2 (two) times daily. 03/25/14   [name], MD  nitroGLYCERIN (NITROSTAT) 0.4 MG SL tablet Place 0.4 mg under the tongue every 5 (five) minutes as needed for chest pain.    Historical Provider, MD  valACYclovir (VALTREX) 500 MG tablet Take 500 mg by mouth daily.    Historical Provider, MD    ALLERGIES:  Allergies  Allergen Reactions  . Penicillins Anaphylaxis  . Latex Rash  . Other Rash    **EKG GEL PADS**  "Burns my skin"    SOCIAL HISTORY:  History  Substance Use Topics  . Smoking status: Never Smoker   . Smokeless tobacco: Never Used  . Alcohol Use: No    FAMILY HISTORY: Family History  Problem Relation Age of Onset  . Cancer Other   . Hyperlipidemia Other   . Hypertension Other   . Asthma Other     EXAM: BP 110/75 mmHg  Pulse 87  Resp 18  SpO2 100%  LMP 06/12/2014 CONSTITUTIONAL: Alert and oriented and responds appropriately to questions. Well-appearing; well-nourished HEAD: Normocephalic EYES: Conjunctivae clear, PERRL ENT: normal nose; no rhinorrhea; moist mucous membranes; pharynx without lesions noted NECK: Supple, no meningismus, no LAD  CARD: RRR; S1 and S2 appreciated; no murmurs, no clicks,  no rubs, no gallops RESP: Normal chest excursion without splinting or tachypnea; breath sounds clear and equal bilaterally; no wheezes, no rhonchi, no rales,  ABD/GI: Normal bowel sounds; non-distended; soft, non-tender, no rebound, no guarding BACK:  The back appears normal and is non-tender to palpation, there is no CVA tenderness EXT: Tender to palpation over the lateral aspect of the right foot and right ankle with no bony deformity, pain with inversion of the right foot, no ligamentous laxity, no joint effusion, no erythema or warmth, Normal ROM in all joints; otherwise  extremities are non-tender to palpation; no edema; normal capillary refill; no cyanosis; 2+ DP pulses bilaterally, compartments soft, no calf tenderness or swelling, no significant pain over the right hip; no bony deformity noted in any of her extremities   SKIN: Normal color for age and race; warm NEURO: Moves all extremities equally PSYCH: The patient's mood and manner are appropriate. Grooming and personal hygiene are appropriate.  MEDICAL DECISION MAKING: Patient here with right foot and ankle pain. Suspect tendinitis after wearing certain shoes yesterday. No history of recent injury to suggest a fracture. No ligamentous laxity on exam. She is requesting that we discharge her with a postop shoe but have discussed with patient that I do not feel this is indicated. Have advised her to continue to use an Ace wrap for compression, keep the foot elevated, use ibuprofen for pain and apply ice. Have advised her to use shoes with more support .  We'll discharge with sort prescription for Vicodin for pain control. Have advised her for symptoms continue that she needs to follow-up with an orthopedic surgeon. No signs of gout, septic arthritis on exam. Neurovascularly intact distally. Discussed with patient that given no recent injury do not feel x-rays are indicated at this time and she agrees.  She verbalizes understanding and is comfortable with this plan.       Narcissa, DO 06/17/14 0930

## 2014-06-17 NOTE — ED Notes (Addendum)
Add on to assessment:Patient states she is tired of having to wear flats in the rain.

## 2014-06-30 ENCOUNTER — Emergency Department (HOSPITAL_COMMUNITY)
Admission: EM | Admit: 2014-06-30 | Discharge: 2014-06-30 | Disposition: A | Discharge status: Home / Self Care | Payer: No Typology Code available for payment source | Attending: Emergency Medicine | Admitting: Emergency Medicine

## 2014-06-30 ENCOUNTER — Encounter (HOSPITAL_COMMUNITY): Payer: Self-pay | Admitting: Emergency Medicine

## 2014-06-30 ENCOUNTER — Emergency Department (HOSPITAL_COMMUNITY): Payer: No Typology Code available for payment source

## 2014-06-30 DIAGNOSIS — Y92410 Unspecified street and highway as the place of occurrence of the external cause: Secondary | ICD-10-CM | POA: Insufficient documentation

## 2014-06-30 DIAGNOSIS — E669 Obesity, unspecified: Secondary | ICD-10-CM | POA: Insufficient documentation

## 2014-06-30 DIAGNOSIS — R011 Cardiac murmur, unspecified: Secondary | ICD-10-CM | POA: Insufficient documentation

## 2014-06-30 DIAGNOSIS — E119 Type 2 diabetes mellitus without complications: Secondary | ICD-10-CM | POA: Insufficient documentation

## 2014-06-30 DIAGNOSIS — Z79899 Other long term (current) drug therapy: Secondary | ICD-10-CM | POA: Diagnosis not present

## 2014-06-30 DIAGNOSIS — Q210 Ventricular septal defect: Secondary | ICD-10-CM | POA: Insufficient documentation

## 2014-06-30 DIAGNOSIS — Y998 Other external cause status: Secondary | ICD-10-CM | POA: Insufficient documentation

## 2014-06-30 DIAGNOSIS — J45909 Unspecified asthma, uncomplicated: Secondary | ICD-10-CM | POA: Insufficient documentation

## 2014-06-30 DIAGNOSIS — S8992XA Unspecified injury of left lower leg, initial encounter: Secondary | ICD-10-CM | POA: Diagnosis present

## 2014-06-30 DIAGNOSIS — S8002XA Contusion of left knee, initial encounter: Secondary | ICD-10-CM | POA: Diagnosis not present

## 2014-06-30 DIAGNOSIS — Y9389 Activity, other specified: Secondary | ICD-10-CM | POA: Insufficient documentation

## 2014-06-30 DIAGNOSIS — I10 Essential (primary) hypertension: Secondary | ICD-10-CM | POA: Insufficient documentation

## 2014-06-30 DIAGNOSIS — S2002XA Contusion of left breast, initial encounter: Secondary | ICD-10-CM | POA: Diagnosis not present

## 2014-06-30 DIAGNOSIS — R52 Pain, unspecified: Secondary | ICD-10-CM

## 2014-06-30 MED ORDER — OXYCODONE-ACETAMINOPHEN 5-325 MG PO TABS
1.0000 | ORAL_TABLET | ORAL | Status: AC
  Administered 2014-06-30: 1 via ORAL
  Filled 2014-06-30: qty 1

## 2014-06-30 MED ORDER — OXYCODONE-ACETAMINOPHEN 5-325 MG PO TABS: 1.0000 | ORAL_TABLET | Freq: Four times a day (QID) | ORAL | Status: DC | PRN

## 2014-06-30 MED ORDER — CYCLOBENZAPRINE HCL 5 MG PO TABS: 5.0000 mg | ORAL_TABLET | Freq: Three times a day (TID) | ORAL | Status: DC | PRN

## 2014-06-30 MED ORDER — CYCLOBENZAPRINE HCL 10 MG PO TABS
5.0000 mg | ORAL_TABLET | Freq: Once | ORAL | Status: AC
  Administered 2014-06-30: 5 mg via ORAL
  Filled 2014-06-30: qty 1
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
## 2014-07-04 ENCOUNTER — Ambulatory Visit (HOSPITAL_BASED_OUTPATIENT_CLINIC_OR_DEPARTMENT_OTHER): Payer: Medicaid Other | Attending: Cardiology | Admitting: *Deleted

## 2014-07-04 VITALS — Ht 59.0 in | Wt 165.0 lb

## 2014-07-04 DIAGNOSIS — G4730 Sleep apnea, unspecified: Secondary | ICD-10-CM | POA: Diagnosis not present

## 2014-07-04 DIAGNOSIS — R0683 Snoring: Secondary | ICD-10-CM | POA: Insufficient documentation

## 2014-07-04 DIAGNOSIS — G4700 Insomnia, unspecified: Secondary | ICD-10-CM

## 2014-07-04 DIAGNOSIS — G4719 Other hypersomnia: Secondary | ICD-10-CM

## 2014-07-04 DIAGNOSIS — G4710 Hypersomnia, unspecified: Secondary | ICD-10-CM | POA: Insufficient documentation

## 2014-07-04 DIAGNOSIS — N3944 Nocturnal enuresis: Secondary | ICD-10-CM

## 2014-07-05 ENCOUNTER — Ambulatory Visit (HOSPITAL_BASED_OUTPATIENT_CLINIC_OR_DEPARTMENT_OTHER): Payer: Medicaid Other | Admitting: Internal Medicine

## 2014-07-05 DIAGNOSIS — R0683 Snoring: Secondary | ICD-10-CM

## 2014-07-05 DIAGNOSIS — N3944 Nocturnal enuresis: Secondary | ICD-10-CM

## 2014-07-05 DIAGNOSIS — G478 Other sleep disorders: Secondary | ICD-10-CM

## 2014-07-05 DIAGNOSIS — G4719 Other hypersomnia: Secondary | ICD-10-CM

## 2014-07-05 NOTE — Sleep Study (Signed)
   NAME: Mackenzie Patterson DATE OF BIRTH:  05/30/1971 MEDICAL RECORD NUMBER [id number]  LOCATION: [facility]  PHYSICIAN: YOUNG,CLINTON D  DATE OF STUDY: 07/04/2014  SLEEP STUDY TYPE: Nocturnal Polysomnogram               REFERRING PHYSICIAN: Spruill, Ardyth Gal, MD  INDICATION FOR STUDY: Hypersomnia with sleep apnea  EPWORTH SLEEPINESS SCORE:   11/24 HEIGHT: 4\' 11"  (149.9 cm)  WEIGHT: 74.844 kg (165 lb)    Body mass index is 33.31 kg/(m^2).  NECK SIZE: 14.5 in.  MEDICATIONS: Charted for review  SLEEP ARCHITECTURE: Total sleep time 379.5 minutes with sleep efficiency 89.5%. Stage I was 2.9%, stage II 77.9%, stage III absent, REM 19.2% of total sleep time. Sleep latency 38 minutes, REM latency 67.5 minutes, awake after sleep onset 6.5 minutes, arousal index 5.1, bedtime medication: None  RESPIRATORY DATA: Apnea hypopnea index (AHI) 0.8 per hour. 5 total events scored including 3 central apneas and 2 hypopneas. All events were nonsupine. REM AHI 0.8 per hour. CPAP titration was not done.  OXYGEN DATA: Mild snoring with oxygen desaturation to a nadir of 93% and mean saturation 97% on room air  CARDIAC DATA: Normal sinus rhythm  MOVEMENT/PARASOMNIA: No significant movement disturbance, no bathroom trips  IMPRESSION/ RECOMMENDATION:   1) Unremarkable sleep architecture for sleep center environment without medication 2) Respiratory pattern within normal limits. Occasional respiratory event with sleep disturbance, AHI 0.8 per hour. The normal range for adults is an AHI from 0-5 events per hour. Mild snoring with oxygen desaturation to a nadir of 93% and mean saturation 97% on room air.   Deneise Lever Diplomate, American Board of Sleep Medicine  ELECTRONICALLY SIGNED ON:  07/05/2014, 3:34 PM [place] PH: [phone]   FX: [phone] [place]

## 2014-07-25 ENCOUNTER — Ambulatory Visit: Payer: Medicaid Other | Attending: Cardiology | Admitting: Physical Therapy

## 2014-07-25 ENCOUNTER — Telehealth: Payer: Self-pay | Admitting: *Deleted

## 2014-07-25 DIAGNOSIS — M25562 Pain in left knee: Secondary | ICD-10-CM | POA: Insufficient documentation

## 2014-07-25 NOTE — Telephone Encounter (Signed)
appts made and printed...td 

## 2014-07-25 NOTE — Patient Instructions (Addendum)
Knee Flexion and extension 3-5 minutes every 2 hours;  SLRs, SAQ, sidelying hip abduction 10x 2x/day  Knee Extension Mobilization: Hang (Prone)   With table supporting thighs, place ___0_ pound weight on right ankle. Hold ____ minutes. Repeat ___10_ times per set. Do ____ sets per session. Do __3__ sessions per day.  KNEE: Extension, Long Arc Quads - Sitting   Raise leg until knee is straight. _10__ reps per set, __1_ sets per day, __7 days per week
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
## 2014-07-31 ENCOUNTER — Ambulatory Visit: Payer: Medicaid Other | Attending: Cardiology | Admitting: Physical Therapy

## 2014-07-31 DIAGNOSIS — M25562 Pain in left knee: Secondary | ICD-10-CM

## 2014-07-31 NOTE — Therapy (Signed)
[place], [place], [address] Phone: [phone]   Fax:  [phone]  Physical Therapy Treatment  Patient Details  Name: Mackenzie Patterson MRN: [id number] Date of Birth: 1970-08-11 Referring Provider:  [name], MD  Encounter Date: 07/31/2014      PT End of Session - 07/31/14 1506    Visit Number 2   Number of Visits 16   Date for PT Re-Evaluation 09/19/14   PT Start Time 1430   PT Stop Time 1515   PT Time Calculation (min) 45 min   Activity Tolerance Patient tolerated treatment well      Past Medical History  Diagnosis Date  . Obesity   . Heart murmur   . Congenital ventricular septal defect   . Asthma   . Diabetes mellitus without complication   . VSD (ventricular septal defect), perimembranous     Qp:Qs 1.7:1 by catheterization   . Pulmonary hypertension     mild   . Tricuspid regurgitation     Past Surgical History  Procedure Laterality Date  . Tubal ligation    . Cyst excision    . Left and right heart catheterization with coronary angiogram [name] 09/11/2013    Procedure: LEFT AND RIGHT HEART CATHETERIZATION WITH CORONARY ANGIOGRAM;  Surgeon: [name], MD;  Location: [facility] CATH LAB;  Service: Cardiovascular;  Laterality: [name];    There were no vitals taken for this visit.  Visit Diagnosis:  Knee pain, acute, left      Subjective Assessment - 07/31/14 1434    Symptoms Patient got stuck in traffic and arrives 15 min late.  Patient reports she used the treadmill for 20 min before arriving.   Currently in Pain? Yes   Pain Score 7    Pain Location Knee   Pain Orientation Left   Aggravating Factors  walking, squatting   Pain Relieving Factors using ice at home          [facility] PT Assessment - 07/31/14 1442    AROM   Left Knee Extension 5   Left Knee Flexion 125                  [facility] Adult PT Treatment/Exercise - 07/31/14 1436    Exercises   Exercises  Knee/Hip   Knee/Hip Exercises: Stretches   Gastroc Stretch 3 reps;20 seconds  Prostretch   Knee/Hip Exercises: Aerobic   Stationary Bike --  Nu-Step L1 6 min   Knee/Hip Exercises: Standing   Lateral Step Up Left;10 reps;Step Height: 2";Hand Hold: 0   Forward Step Up Left;10 reps;Hand Hold: 0;Step Height: 2"   Step Down Left;10 reps;Step Height: 2"   SLS with Vectors --  WB on left with right 4 ways 10x   Other Standing Knee Exercises --  Standing TKEs with red band 10x   Knee/Hip Exercises: Seated   Long Arc Quad Left;AROM;15 reps   Knee/Hip Exercises: Supine   Short Arc Quad Sets Left;10 reps   Heel Slides --  Bilateral with heels on green ball 2x10   Bridges Both;10 reps  over green ball   Other Supine Knee Exercises --  HS sets on ball 10x   Modalities   Modalities Cryotherapy   Cryotherapy   Number Minutes Cryotherapy 10 Minutes   Cryotherapy Location Knee   Type of Cryotherapy Ice pack                  PT Short Term Goals -  07/31/14 1510    PT SHORT TERM GOAL #1   Title "Independent with initial HEP   Time 4   Period Weeks   Status Achieved   PT SHORT TERM GOAL #2   Title "Demonstrate and verbalize understanding of condition management including RICE, positioning, use of A.D., HEP.    Time 4   Period Weeks   Status Achieved   PT SHORT TERM GOAL #3   Title Left knee flexion to 90 degrees needed for greater ease getting in/out of the car.   Status Achieved   PT SHORT TERM GOAL #4   Title Left ankle dorsiflexion to 0 degrees needed for ambulatating household distances and to the mailbox with less pain/difficulty   Time 4   Period Weeks   Status On-going           PT Long Term Goals - 07/31/14 1511    PT LONG TERM GOAL #1   Title "Pt will be independent with advanced HEP.    Time 8   Period Weeks   Status On-going   PT LONG TERM GOAL #2   Title Left knee AROM flexion will improve to 2-110 degrees for improved mobility to ride in the car and  negotiate curbs for community mobility.     Time 8   Period Weeks   Status On-going   PT LONG TERM GOAL #3   Title Patient will have left LE grossly 4-/5 quads, HS,gluteals and gastroc needed for ambulating short community distances to get from the parking lot into the store and decreased incidence of knee give-way.   Time 8   Period Weeks   Status On-going   PT LONG TERM GOAL #4   Title Patient will have a gait speed of .50m/sec needed for community ambulation and decreased incidence of adverse events.   Time 8   Period Weeks   Status On-going   PT LONG TERM GOAL #5   Title FOTO functional outcome score will improve from 58% limitation to 40% limitation indicating improved function with less pain.   Time 8   Period Weeks   Status On-going               Plan - 07/31/14 1507    Clinical Impression Statement Major improvement in left knee AROM and quad activation although pain score higher on arrival.  Patient with improved knee flexion and extension with gait although decreased heel strike persists.  Progressing with STGs.  Therapist providing verbal cues to avoid knee hyperextension during single leg standing and monitoring pain.   PT Next Visit Plan Add bike, continue Nu-Step;  progress quad motor control;  ?leg press on low weight; cryotherapy or vasocompression        Problem List Patient Active Problem List   Diagnosis Date Noted  . Tricuspid regurgitation   . Asthma   . VSD (ventricular septal defect), perimembranous 09/03/2013  . Pulmonary hypertension 09/03/2013  . Essential hypertension 08/06/2013  . Diabetes mellitus 08/06/2013  . Obesity 08/06/2013  . BP (high blood pressure) 07/27/2012  . Awareness of heartbeats 07/27/2012  . Absence of interventricular septum 07/27/2012    Alvera Singh 07/31/2014, 3:12 PM  [place], [place], [address] Phone: [phone]   Fax:   [phone]   Ruben Im, PT 07/31/2014 3:12 PM Phone: [phone] Fax: [phone]

## 2014-08-01 ENCOUNTER — Encounter: Payer: Medicaid Other | Admitting: Physical Therapy

## 2014-08-05 ENCOUNTER — Ambulatory Visit: Payer: Medicaid Other | Admitting: Physical Therapy

## 2014-08-05 DIAGNOSIS — M25562 Pain in left knee: Secondary | ICD-10-CM

## 2014-08-05 NOTE — Therapy (Signed)
[place], [place], [address] Phone: [phone]   Fax:  [phone]  Physical Therapy Treatment  Patient Details  Name: Mackenzie Patterson MRN: [id number] Date of Birth: 1971/01/27 Referring Provider:  [name], MD  Encounter Date: 08/05/2014      PT End of Session - 08/05/14 1009    Visit Number 3   Number of Visits 16   Date for PT Re-Evaluation 09/19/14   PT Start Time 0938   PT Stop Time 1000   PT Time Calculation (min) 22 min   Activity Tolerance Patient tolerated treatment well      Past Medical History  Diagnosis Date  . Obesity   . Heart murmur   . Congenital ventricular septal defect   . Asthma   . Diabetes mellitus without complication   . VSD (ventricular septal defect), perimembranous     Qp:Qs 1.7:1 by catheterization   . Pulmonary hypertension     mild   . Tricuspid regurgitation     Past Surgical History  Procedure Laterality Date  . Tubal ligation    . Cyst excision    . Left and right heart catheterization with coronary angiogram [name] 09/11/2013    Procedure: LEFT AND RIGHT HEART CATHETERIZATION WITH CORONARY ANGIOGRAM;  Surgeon: [name], MD;  Location: [facility] CATH LAB;  Service: Cardiovascular;  Laterality: [name];    There were no vitals taken for this visit.  Visit Diagnosis:  Knee pain, acute, left      Subjective Assessment - 08/05/14 0941    Symptoms Patient denies excessive pain or soreness after last visit.  Uses cane to walk to the mailbox but does not have with her today.  Went to the store Sunday and rode the cart as she usually does.     Currently in Pain? Yes   Pain Score 3    Pain Location Knee   Pain Orientation Left   Pain Type Acute pain   Aggravating Factors  walking and squatting   Pain Relieving Factors using ice at home                    [facility] Adult PT Treatment/Exercise - 08/05/14 0945    Knee/Hip Exercises: Stretches   Passive Hamstring Stretch 2 reps;20 seconds   Knee/Hip Exercises: Aerobic   Stationary Bike 5 min full revolutions   Isokinetic Nu-Step L3 5 min   Knee/Hip Exercises: Machines for Strengthening   Total Gym Leg Press Bilateral 20# (plate 1) 27O   Knee/Hip Exercises: Standing   Heel Raises 20 reps   Hip ADduction Strengthening   Hip ADduction Limitations --  red band 10x   Lateral Step Up Left;10 reps;Step Height: 4"   Forward Step Up Left;10 reps;Hand Hold: 0;Step Height: 4"   Step Down Left;10 reps;Hand Hold: 1;Step Height: 2"   Other Standing Knee Exercises --  hip extension red band 10x   Knee/Hip Exercises: Seated   Long Arc Quad Left;AROM;15 reps   Long Arc Quad Weight 3 lbs.   Knee/Hip Exercises: Sidelying   Clams red band 20x   Cryotherapy   Number Minutes Cryotherapy 10 Minutes   Cryotherapy Location Knee   Type of Cryotherapy Ice pack                  PT Short Term Goals - 08/05/14 1015    PT SHORT TERM GOAL #1   Title "Independent with initial HEP   Status  Achieved   PT SHORT TERM GOAL #2   Title "Demonstrate and verbalize understanding of condition management including RICE, positioning, use of A.D., HEP.    Status Achieved   PT SHORT TERM GOAL #3   Title Left knee flexion to 90 degrees needed for greater ease getting in/out of the car.   Status Achieved           PT Long Term Goals - 08/05/14 1015    PT LONG TERM GOAL #1   Title "Pt will be independent with advanced HEP.    Time 8   Period Weeks   Status On-going   PT LONG TERM GOAL #2   Title Left knee AROM flexion will improve to 2-110 degrees for improved mobility to ride in the car and negotiate curbs for community mobility.     Time 8   Period Weeks   Status On-going   PT LONG TERM GOAL #3   Title Patient will have left LE grossly 4-/5 quads, HS,gluteals and gastroc needed for ambulating short community distances to get from the parking lot into the store and decreased incidence of  knee give-way.   Time 8   Period Weeks   Status On-going   PT LONG TERM GOAL #4   Title Patient will have a gait speed of .60m/sec needed for community ambulation and decreased incidence of adverse events.   Time 8   Period Weeks   Status On-going   PT LONG TERM GOAL #5   Title FOTO functional outcome score will improve from 58% limitation to 40% limitation indicating improved function with less pain.   Time 8   Period Weeks   Status On-going               Plan - 08/05/14 1010    Clinical Impression Statement Verbal cuing to avoid knee hyperextension in standing.  Close monitoring for pain and modifications needed but verbal questioning as patient does not volunteer pain reports.  Progressing with short term and long term goals.     PT Next Visit Plan Continue bike/Nu-Step; standing strengthening of quad motor control; cryotherapy        Problem List Patient Active Problem List   Diagnosis Date Noted  . Tricuspid regurgitation   . Asthma   . VSD (ventricular septal defect), perimembranous 09/03/2013  . Pulmonary hypertension 09/03/2013  . Essential hypertension 08/06/2013  . Diabetes mellitus 08/06/2013  . Obesity 08/06/2013  . BP (high blood pressure) 07/27/2012  . Awareness of heartbeats 07/27/2012  . Absence of interventricular septum 07/27/2012    Alvera Singh 08/05/2014, 10:17 AM  [place], [place], [address] Phone: [phone]   Fax:  [phone]   Ruben Im, PT 08/05/2014 10:18 AM Phone: [phone] Fax: [phone]

## 2014-08-07 ENCOUNTER — Ambulatory Visit: Payer: Medicaid Other | Admitting: Physical Therapy

## 2014-08-07 DIAGNOSIS — M25562 Pain in left knee: Secondary | ICD-10-CM

## 2014-08-07 NOTE — Therapy (Signed)
[place], [place], [address] Phone: [phone]   Fax:  [phone]  Physical Therapy Treatment  Patient Details  Name: Mackenzie Patterson MRN: [id number] Date of Birth: 08/05/1970 Referring Provider:  [name], MD  Encounter Date: 08/07/2014      PT End of Session - 08/07/14 1238    Visit Number 4   Number of Visits 16   Date for PT Re-Evaluation 09/19/14   PT Start Time 0930   PT Stop Time 4270   PT Time Calculation (min) 45 min   Activity Tolerance Patient tolerated treatment well      Past Medical History  Diagnosis Date  . Obesity   . Heart murmur   . Congenital ventricular septal defect   . Asthma   . Diabetes mellitus without complication   . VSD (ventricular septal defect), perimembranous     Qp:Qs 1.7:1 by catheterization   . Pulmonary hypertension     mild   . Tricuspid regurgitation     Past Surgical History  Procedure Laterality Date  . Tubal ligation    . Cyst excision    . Left and right heart catheterization with coronary angiogram [name] 09/11/2013    Procedure: LEFT AND RIGHT HEART CATHETERIZATION WITH CORONARY ANGIOGRAM;  Surgeon: [name], MD;  Location: [facility] CATH LAB;  Service: Cardiovascular;  Laterality: [name];    There were no vitals taken for this visit.  Visit Diagnosis:  Knee pain, acute, left      Subjective Assessment - 08/07/14 0935    Symptoms Denies excessive soreness following last visit.     How long can you walk comfortably? uses cane to walk to the mailbox 50 feet.     Currently in Pain? Yes   Pain Score 3    Pain Location Knee   Pain Orientation Left   Pain Type Acute pain          [facility] PT Assessment - 08/07/14 0956    AROM   Left Knee Extension 0   Left Knee Flexion 135                  [facility] Adult PT Treatment/Exercise - 08/07/14 0936    Ambulation/Gait   Ambulation/Gait Yes   Ambulation Distance (Feet) 513 Feet   Gait Comments --  HR pre 92, post 92;  O2 sat pre 94%, post 98%   Knee/Hip Exercises: Aerobic   Isokinetic NuStep L5 5 min   Knee/Hip Exercises: Machines for Strengthening   Total Gym Leg Press Bilateral 20# (plate 1) 62B   Knee/Hip Exercises: Standing   Heel Raises 20 reps   Lateral Step Up Left;10 reps;Step Height: 4"   SLS with Vectors Stand on blue foam 4 ways 10x   Rebounder SLS on left,right BOSU 3 min   Walking with Sports Cord Plate B 5x FW and BW   Cryotherapy   Number Minutes Cryotherapy 10 Minutes   Cryotherapy Location Knee   Type of Cryotherapy Ice pack                  PT Short Term Goals - 08/07/14 0946    PT SHORT TERM GOAL #1   Title "Independent with initial HEP   Status Achieved   PT SHORT TERM GOAL #2   Title "Demonstrate and verbalize understanding of condition management including RICE, positioning, use of A.D., HEP.    Status Achieved   PT SHORT TERM GOAL #3  Title Left knee flexion to 90 degrees needed for greater ease getting in/out of the car.   Status Achieved           PT Long Term Goals - 08/07/14 1239    PT LONG TERM GOAL #1   Title "Pt will be independent with advanced HEP.    Time 8   Period Weeks   Status On-going   PT LONG TERM GOAL #2   Title Left knee AROM flexion will improve to 2-110 degrees for improved mobility to ride in the car and negotiate curbs for community mobility.     Status Achieved   PT LONG TERM GOAL #3   Title Patient will have left LE grossly 4+/5 quads, HS,gluteals and gastroc needed for ambulating short community distances to get from the parking lot into the store and decreased incidence of knee give-way.   Time 8   Period Weeks   Status Revised   PT LONG TERM GOAL #4   Title Patient will have a gait speed of .81m/sec needed for community ambulation and decreased incidence of adverse events.   Time 8   Period Weeks   Status On-going   PT LONG TERM GOAL #5   Title FOTO functional outcome score  will improve from 58% limitation to 40% limitation indicating improved function with less pain.   Time 8   Period Weeks   Status On-going               Plan - 08/07/14 1004    Clinical Impression Statement Excellent improvement in knee AROM 0-135.  Minimal lateral left knee pain reported during exercises. Able to walk 513 feet before knee begins to ache but declines the need for ice pack.  Moderate verbal cuing to avoid knee hyperextension with single leg standing and patellofemoral alignment.  Progressing well with all goals.   PT Next Visit Plan Continue bike/Nu-Step; standing strengthening of quad motor control; increase resistance with leg press and resisted walking, add resisted sidestepping; Progress walking endurance with monitoring of cardiac symptoms;   cryotherapy        Problem List Patient Active Problem List   Diagnosis Date Noted  . Tricuspid regurgitation   . Asthma   . VSD (ventricular septal defect), perimembranous 09/03/2013  . Pulmonary hypertension 09/03/2013  . Essential hypertension 08/06/2013  . Diabetes mellitus 08/06/2013  . Obesity 08/06/2013  . BP (high blood pressure) 07/27/2012  . Awareness of heartbeats 07/27/2012  . Absence of interventricular septum 07/27/2012    Alvera Singh 08/07/2014, 12:41 PM  [place], [place], [address] Phone: [phone]   Fax:  [phone]   Ruben Im, PT 08/07/2014 12:41 PM Phone: [phone] Fax: [phone]

## 2014-08-12 ENCOUNTER — Ambulatory Visit: Payer: Medicaid Other | Admitting: Rehabilitation

## 2014-08-14 ENCOUNTER — Ambulatory Visit: Payer: Medicaid Other | Admitting: Physical Therapy

## 2014-08-14 DIAGNOSIS — M25562 Pain in left knee: Secondary | ICD-10-CM

## 2014-08-14 NOTE — Therapy (Signed)
[place], [place], [address] Phone: [phone]   Fax:  [phone]  Physical Therapy Treatment  Patient Details  Name: Mackenzie Patterson MRN: [id number] Date of Birth: [birth year] Referring Provider:  [name], MD  Encounter Date: 08/14/2014      PT End of Session - 08/14/14 1003    Visit Number 5   Number of Visits 16   Date for PT Re-Evaluation 09/19/14   PT Start Time 0930   PT Stop Time 1020   PT Time Calculation (min) 50 min   Activity Tolerance Patient tolerated treatment well      Past Medical History  Diagnosis Date  . Obesity   . Heart murmur   . Congenital ventricular septal defect   . Asthma   . Diabetes mellitus without complication   . VSD (ventricular septal defect), perimembranous     Qp:Qs 1.7:1 by catheterization   . Pulmonary hypertension     mild   . Tricuspid regurgitation     Past Surgical History  Procedure Laterality Date  . Tubal ligation    . Cyst excision    . Left and right heart catheterization with coronary angiogram [name] 09/11/2013    Procedure: LEFT AND RIGHT HEART CATHETERIZATION WITH CORONARY ANGIOGRAM;  Surgeon: [name], MD;  Location: [facility] CATH LAB;  Service: Cardiovascular;  Laterality: [name];    There were no vitals taken for this visit.  Visit Diagnosis:  Knee pain, acute, left      Subjective Assessment - 08/14/14 0929    Symptoms Denies excessive soreness after last visit.  Patient states she walked the track yesterday twice and on the 3rd lap her knee started swelling.  Because of that she had difficulty sleeping.  She put ice on this morning and feels better now.  No visible or palpable swelling at present.     Currently in Pain? Yes   Pain Score 5    Pain Location Knee   Aggravating Factors  prolonged walking, squatting   Pain Relieving Factors using ice at home; pain pill          [facility] PT Assessment - 08/14/14 0940    Ambulation/Gait   Ambulation/Gait Yes   Gait velocity --  10 m 6.5sec   Gait Comments 5.4 sec                  [facility] Adult PT Treatment/Exercise - 08/14/14 0940    Knee/Hip Exercises: Machines for Strengthening   Total Gym Leg Press Bilateral 40# (plate 2) 38S; left only 20# 20x   Knee/Hip Exercises: Standing   Heel Raises 15 reps  on rocker board alternating with squats   Lateral Step Up Left;10 reps;Step Height: 4"  3 points   Forward Step Up 15 reps  WB on left, step tap to high step   Walking with Sports Cord Plate C 5x FW and BW   Other Standing Knee Exercises SLS on right with red band UE diagonals 15x   Cryotherapy   Number Minutes Cryotherapy 10 Minutes   Cryotherapy Location Knee   Type of Cryotherapy Ice pack                  PT Short Term Goals - 08/14/14 1009    PT SHORT TERM GOAL #1   Title "Independent with initial HEP   Status Achieved   PT SHORT TERM GOAL #2   Title "Demonstrate and verbalize understanding of  condition management including RICE, positioning, use of A.D., HEP.    Status Achieved   PT SHORT TERM GOAL #3   Title Left knee flexion to 90 degrees needed for greater ease getting in/out of the car.   Status Achieved   PT SHORT TERM GOAL #4   Title Left ankle dorsiflexion to 0 degrees needed for ambulatating household distances and to the mailbox with less pain/difficulty   Status Achieved           PT Long Term Goals - 08/14/14 9528    PT LONG TERM GOAL #1   Title "Pt will be independent with advanced HEP.    Time 8   Period Weeks   Status On-going   PT LONG TERM GOAL #2   Title Left knee AROM flexion will improve to 2-110 degrees for improved mobility to ride in the car and negotiate curbs for community mobility.     Status Achieved   PT LONG TERM GOAL #3   Title Patient will have left LE grossly 4+/5 quads, HS,gluteals and gastroc needed for ambulating short community distances to get from the parking lot into the  store and decreased incidence of knee give-way.   Time 8   Period Weeks   Status On-going   PT LONG TERM GOAL #4   Title Patient will have a gait speed of .11m/sec needed for community ambulation and decreased incidence of adverse events.   Baseline 1.34m/sec   Time 8   Period Weeks   Status Achieved   PT LONG TERM GOAL #5   Title FOTO functional outcome score will improve from 58% limitation to 40% limitation indicating improved function with less pain.   Time 8   Period Weeks   Status On-going               Plan - 08/14/14 1003    Clinical Impression Statement Excellent improvement in gait speed: 1.36m/sec without an assistive device.  No limp on level surfaces.  No shortness of breath With questioning, patient reports lateral knee pain but shows no pain behaviors and challenges herself by doing more repetitions than asked to do with many exercises.  Min verbal cues to avoid knee hyperextension and for patellofemoral alignment.  Should meet remaining goals in 2 weeks.     PT Next Visit Plan Continue bike/Nu-Step; standing strengthening of quad motor control;  Progress walking endurance with monitoring of cardiac symptoms;   cryotherapy as needed        Problem List Patient Active Problem List   Diagnosis Date Noted  . Tricuspid regurgitation   . Asthma   . VSD (ventricular septal defect), perimembranous 09/03/2013  . Pulmonary hypertension 09/03/2013  . Essential hypertension 08/06/2013  . Diabetes mellitus 08/06/2013  . Obesity 08/06/2013  . BP (high blood pressure) 07/27/2012  . Awareness of heartbeats 07/27/2012  . Absence of interventricular septum 07/27/2012    Alvera Singh 08/14/2014, 2:18 PM  [facility] [address] [place], [place], [address] Phone: [phone]   Fax:  [phone]   Ruben Im, PT 08/14/2014 2:18 PM Phone: [phone] Fax: [phone]  Ruben Im, [place] 08/14/2014 2:18  PM Phone: [phone] Fax: [phone]

## 2014-08-19 ENCOUNTER — Ambulatory Visit: Payer: Medicaid Other | Admitting: Rehabilitation

## 2014-08-19 DIAGNOSIS — M25562 Pain in left knee: Secondary | ICD-10-CM

## 2014-08-19 NOTE — Therapy (Signed)
[place], [place], [address] Phone: [phone]   Fax:  [phone]  Physical Therapy Treatment  Patient Details  Name: Mackenzie Patterson MRN: [id number] Date of Birth: 01/12/1971 Referring Provider:  [name], MD  Encounter Date: 08/19/2014      PT End of Session - 08/19/14 1059    Visit Number 6   Number of Visits 16   Date for PT Re-Evaluation 09/19/14   PT Start Time 8563   PT Stop Time 1497   PT Time Calculation (min) 43 min      Past Medical History  Diagnosis Date  . Obesity   . Heart murmur   . Congenital ventricular septal defect   . Asthma   . Diabetes mellitus without complication   . VSD (ventricular septal defect), perimembranous     Qp:Qs 1.7:1 by catheterization   . Pulmonary hypertension     mild   . Tricuspid regurgitation     Past Surgical History  Procedure Laterality Date  . Tubal ligation    . Cyst excision    . Left and right heart catheterization with coronary angiogram [name] 09/11/2013    Procedure: LEFT AND RIGHT HEART CATHETERIZATION WITH CORONARY ANGIOGRAM;  Surgeon: [name], MD;  Location: [facility] CATH LAB;  Service: Cardiovascular;  Laterality: [name];    There were no vitals taken for this visit.  Visit Diagnosis:  Knee pain, acute, left        [facility] PT Assessment - 08/19/14 1043    AROM   Left Knee Extension 0   Left Knee Flexion 140   Right/Left Ankle Left   Left Ankle Dorsiflexion 7   Strength   Strength Assessment Site Ankle   Left Knee Flexion 4+/5   Left Knee Extension 4/5   Right/Left Ankle Left   Left Ankle Plantar Flexion 5/5                  [facility] Adult PT Treatment/Exercise - 08/19/14 1022    Knee/Hip Exercises: Stretches   Gastroc Stretch 3 reps;20 seconds  edge of step   Knee/Hip Exercises: Aerobic   Isokinetic NuStep L5 7 min   Knee/Hip Exercises: Machines for Strengthening   Total Gym Leg Press Bilateral 50#  (plate 2.5) 02O; left only 20# 10x2   Knee/Hip Exercises: Standing   Heel Raises 15 reps  edge of step   Lateral Step Up 15 reps   Forward Step Up 15 reps;Step Height: 4"   Step Down 2 sets;10 reps;Step Height: 2";Step Height: 4"   Step Down Limitations increased pain with 4 inch step   Rebounder SLS on and off blue foam with cues for bent knee   Knee/Hip Exercises: Supine   Short Arc Quad Sets Strengthening;Left;3 sets;10 reps   Short Arc Quad Sets Limitations 3#   Straight Leg Raises Left;2 sets;10 reps  pain lateral joint line   Straight Leg Raises Limitations 2#                  PT Short Term Goals - 08/14/14 1009    PT SHORT TERM GOAL #1   Title "Independent with initial HEP   Status Achieved   PT SHORT TERM GOAL #2   Title "Demonstrate and verbalize understanding of condition management including RICE, positioning, use of A.D., HEP.    Status Achieved   PT SHORT TERM GOAL #3   Title Left knee flexion to 90 degrees needed for greater  ease getting in/out of the car.   Status Achieved   PT SHORT TERM GOAL #4   Title Left ankle dorsiflexion to 0 degrees needed for ambulatating household distances and to the mailbox with less pain/difficulty   Status Achieved           PT Long Term Goals - 08/19/14 1019    PT LONG TERM GOAL #1   Title "Pt will be independent with advanced HEP.    Time 8   Period Weeks   Status On-going   PT LONG TERM GOAL #2   Title Left knee AROM flexion will improve to 2-110 degrees for improved mobility to ride in the car and negotiate curbs for community mobility.     Time 8   Period Weeks   Status Achieved   PT LONG TERM GOAL #3   Title Patient will have left LE grossly 4+/5 quads, HS,gluteals and gastroc needed for ambulating short community distances to get from the parking lot into the store and decreased incidence of knee give-way.   Time 8   Period Weeks   Status Partially Met   PT LONG TERM GOAL #4   Title Patient will have  a gait speed of .48msec needed for community ambulation and decreased incidence of adverse events.   Time 8   Period Weeks   Status Achieved   PT LONG TERM GOAL #5   Title FOTO functional outcome score will improve from 58% limitation to 40% limitation indicating improved function with less pain.   Time 8   Period Weeks   Status On-going               Plan - 08/19/14 1045    Clinical Impression Statement Pt reports knee buckles 3 times per month except yesterday it buckled 2 times while walking and then it gave away whe she was squatting to pick up a dropped water bottle causing a painful pop in left knee and a fall to the ground. She reports increased swelling afterwards  that was relieved by ice. Tender to palpation lateral joint line. Left knee strength improving, LTG#3 partially MET.  Pt reports decreased inastance of knee giving away.     PT Next Visit Plan Continue bike/Nu-Step; standing strengthening of quad motor control;  Progress walking endurance with monitoring of cardiac symptoms;   cryotherapy as needed        Problem List Patient Active Problem List   Diagnosis Date Noted  . Tricuspid regurgitation   . Asthma   . VSD (ventricular septal defect), perimembranous 09/03/2013  . Pulmonary hypertension 09/03/2013  . Essential hypertension 08/06/2013  . Diabetes mellitus 08/06/2013  . Obesity 08/06/2013  . BP (high blood pressure) 07/27/2012  . Awareness of heartbeats 07/27/2012  . Absence of interventricular septum 07/27/2012    DDorene Ar PDelaware2/23/2016, 10:59 AM  [place] [address]Phone: [phone]  Fax:  [phone]

## 2014-08-21 ENCOUNTER — Ambulatory Visit: Payer: Medicaid Other | Admitting: Physical Therapy

## 2014-08-21 DIAGNOSIS — M25562 Pain in left knee: Secondary | ICD-10-CM

## 2014-08-21 NOTE — Therapy (Signed)
[place], [place], [address] Phone: [phone]   Fax:  [phone]  Physical Therapy Treatment  Patient Details  Name: Mackenzie Patterson MRN: [id number] Date of Birth: September 01, 1970 Referring Provider:  [name], MD  Encounter Date: 08/21/2014      PT End of Session - 08/21/14 1101    Visit Number 7   Number of Visits 16   Date for PT Re-Evaluation 09/19/14   PT Start Time 3875   PT Stop Time 1108   PT Time Calculation (min) 53 min   Activity Tolerance Patient tolerated treatment well      Past Medical History  Diagnosis Date  . Obesity   . Heart murmur   . Congenital ventricular septal defect   . Asthma   . Diabetes mellitus without complication   . VSD (ventricular septal defect), perimembranous     Qp:Qs 1.7:1 by catheterization   . Pulmonary hypertension     mild   . Tricuspid regurgitation     Past Surgical History  Procedure Laterality Date  . Tubal ligation    . Cyst excision    . Left and right heart catheterization with coronary angiogram [name] 09/11/2013    Procedure: LEFT AND RIGHT HEART CATHETERIZATION WITH CORONARY ANGIOGRAM;  Surgeon: [name], MD;  Location: [facility] CATH LAB;  Service: Cardiovascular;  Laterality: [name];    There were no vitals taken for this visit.  Visit Diagnosis:  Knee pain, acute, left      Subjective Assessment - 08/21/14 1021    Symptoms 5/10 current pain level.  States she has been wearing her knee brace since her knee buckled on Monday.  No other knee give-way incidents since then.  Shopped for an hour yesterday  leaning on the buggy.   Swelling persists but the brace helps per patient report.     How long can you walk comfortably? uses cane to walk to the mailbox 50 feet.     Currently in Pain? Yes   Pain Score 6    Pain Location Knee   Pain Orientation Left   Pain Type Acute pain   Pain Onset More than a month ago   Aggravating Factors   squatting, prolonged walking   Pain Relieving Factors ice                    [facility] Adult PT Treatment/Exercise - 08/21/14 1024    Ambulation/Gait   Ambulation Distance (Feet) 1000 Feet  4 minutes   Knee/Hip Exercises: Stretches   Active Hamstring Stretch 30 seconds;2 reps   Knee/Hip Exercises: Aerobic   Isokinetic NuStep L5 6 min   Knee/Hip Exercises: Machines for Strengthening   Total Gym Leg Press Bilateral 20# 10x 30x; left only 20# 20x   Knee/Hip Exercises: Standing   Hip ADduction Strengthening;Both;10 reps   Forward Step Up Step Height: 6";10 reps;Hand Hold: 1   Wall Squat 10 reps   SLS WB on left with right UE green band diagonals 20x   Other Standing Knee Exercises Reaching up wall with glut squeeze 15x   Other Standing Knee Exercises church pew sway 2 minutes   Knee/Hip Exercises: Seated   Long Arc Quad Strengthening;Left;20 reps   Knee/Hip Exercises: Supine   Other Supine Knee Exercises HS series on green ball 4x10   Knee/Hip Exercises: Sidelying   Clams green band 20x   Cryotherapy   Number Minutes Cryotherapy 10 Minutes   Cryotherapy Location  Knee   Type of Cryotherapy Ice pack                  PT Short Term Goals - 08/21/14 1859    PT SHORT TERM GOAL #1   Title "Independent with initial HEP   Status Achieved   PT SHORT TERM GOAL #2   Title "Demonstrate and verbalize understanding of condition management including RICE, positioning, use of A.D., HEP.    Status Achieved   PT SHORT TERM GOAL #3   Title Left knee flexion to 90 degrees needed for greater ease getting in/out of the car.   Status Achieved           PT Long Term Goals - 08/21/14 1859    PT LONG TERM GOAL #1   Title "Pt will be independent with advanced HEP.    Time 8   Period Weeks   Status On-going   PT LONG TERM GOAL #2   Title Left knee AROM flexion will improve to 2-110 degrees for improved mobility to ride in the car and negotiate curbs for community mobility.      Status Achieved   PT LONG TERM GOAL #3   Title Patient will have left LE grossly 4+/5 quads, HS,gluteals and gastroc needed for ambulating short community distances to get from the parking lot into the store and decreased incidence of knee give-way.   Time 8   Period Weeks   Status Partially Met   PT LONG TERM GOAL #4   Title Patient will have a gait speed of .75msec needed for community ambulation and decreased incidence of adverse events.   Status Achieved   PT LONG TERM GOAL #5   Title FOTO functional outcome score will improve from 58% limitation to 40% limitation indicating improved function with less pain.   Time 8   Period Weeks   Status On-going               Plan - 08/21/14 1857    Clinical Impression Statement No episodes of buckling today although patient reports soreness and achiness.  Exercises modified in response to increased pain this pain.  Patient with much improved gait speed and ambulation endurance to 1000 feet today in 4 minutes.  Therapist closely monitoring pain and verbally cuing to avoid knee hyperextension with exercises.     PT Next Visit Plan Continue bike/Nu-Step; standing strengthening of quad motor control;  Progress walking endurance with monitoring of cardiac symptoms;   cryotherapy as needed        Problem List Patient Active Problem List   Diagnosis Date Noted  . Tricuspid regurgitation   . Asthma   . VSD (ventricular septal defect), perimembranous 09/03/2013  . Pulmonary hypertension 09/03/2013  . Essential hypertension 08/06/2013  . Diabetes mellitus 08/06/2013  . Obesity 08/06/2013  . BP (high blood pressure) 07/27/2012  . Awareness of heartbeats 07/27/2012  . Absence of interventricular septum 07/27/2012    [name]2/25/2016, 7:01 PM  [place] [address]Phone: [phone]  Fax:  [phone]  SRuben Im PT 08/21/2014  7:01 PM Phone: [phone]: [phone]

## 2014-08-22 ENCOUNTER — Ambulatory Visit: Payer: Medicaid Other | Admitting: Cardiology

## 2014-08-28 ENCOUNTER — Ambulatory Visit: Payer: Medicaid Other | Attending: Cardiology | Admitting: Physical Therapy

## 2014-08-28 DIAGNOSIS — M25562 Pain in left knee: Secondary | ICD-10-CM

## 2014-08-28 NOTE — Therapy (Signed)
[place], [place], [address] Phone: [phone]   Fax:  [phone]  Physical Therapy Treatment  Patient Details  Name: Mackenzie Patterson MRN: [id number] Date of Birth: March 03, 1971 Referring Provider:  [name], MD  Encounter Date: 08/28/2014      PT End of Session - 08/28/14 1050    Visit Number 8   Number of Visits 16   Date for PT Re-Evaluation 09/19/14   PT Start Time 1012   PT Stop Time 1102   PT Time Calculation (min) 50 min   Activity Tolerance Patient tolerated treatment well      Past Medical History  Diagnosis Date  . Obesity   . Heart murmur   . Congenital ventricular septal defect   . Asthma   . Diabetes mellitus without complication   . VSD (ventricular septal defect), perimembranous     Qp:Qs 1.7:1 by catheterization   . Pulmonary hypertension     mild   . Tricuspid regurgitation     Past Surgical History  Procedure Laterality Date  . Tubal ligation    . Cyst excision    . Left and right heart catheterization with coronary angiogram [name] 09/11/2013    Procedure: LEFT AND RIGHT HEART CATHETERIZATION WITH CORONARY ANGIOGRAM;  Surgeon: [name], MD;  Location: [facility] CATH LAB;  Service: Cardiovascular;  Laterality: [name];    There were no vitals taken for this visit.  Visit Diagnosis:  Knee pain, acute, left      Subjective Assessment - 08/28/14 1008    Symptoms A little sore today because I walked on the track yesterday, had cane with her.    On the 10th lap, my knee gave way.  No assistive device today.     Pertinent History Patient states she is on disability because she has a hole in her heart.  Gets short of breath sometimes.     Currently in Pain? Yes   Pain Score 4    Pain Location Knee   Pain Orientation Left   Pain Type Acute pain   Aggravating Factors  prolonged walking, squatting   Pain Relieving Factors ice helps a little, Icy Hot          [facility]  [facility]essment - 08/28/14 1045    Strength   Strength Assessment Site Hip   Right/Left Hip Left   Left Hip ABduction 4/5   Left Knee Flexion 4+/5   Left Knee Extension 4/5                  [facility] Adult PT Treatment/Exercise - 08/28/14 1017    Ambulation/Gait   Ambulation Distance (Feet) 1513 Feet  6 minutes (tingling but no give-way)   Knee/Hip Exercises: Stretches   Active Hamstring Stretch 30 seconds;2 reps   Knee/Hip Exercises: Aerobic   Isokinetic NuStep L5 7 min   Knee/Hip Exercises: Machines for Strengthening   Total Gym Leg Press Bilateral 20# 10x 30x; left only 20# 20x   Knee/Hip Exercises: Standing   Hip ADduction Strengthening;Both;10 reps  red band 15x right/left   Forward Step Up --  4 inch step up with left, right touch high step 15x   Step Down 2 sets;10 reps;Step Height: 2";Step Height: 4"  lateral 3 points   Rebounder tandem stand, SLS and SLS on blue foam 45 sec each cues to avoid knee hyperextension   Other Standing Knee Exercises Sit to stand 10x; with side step red band 10x;  Other Standing Knee Exercises monster walk with red band around thighs and heel/toe walk 5x;     Knee/Hip Exercises: Supine   Bridges Strengthening;15 reps   Other Supine Knee Exercises Abdominal brace with bent/knee lifts to 90 15x   Knee/Hip Exercises: Sidelying   Clams green band 20x                  PT Short Term Goals - 08/28/14 1049    PT SHORT TERM GOAL #1   Title "Independent with initial HEP   Status Achieved   PT SHORT TERM GOAL #2   Title "Demonstrate and verbalize understanding of condition management including RICE, positioning, use of A.D., HEP.    Status Achieved   PT SHORT TERM GOAL #3   Title Left knee flexion to 90 degrees needed for greater ease getting in/out of the car.   Status Achieved   PT SHORT TERM GOAL #4   Title Left ankle dorsiflexion to 0 degrees needed for ambulatating household distances and to the mailbox with less  pain/difficulty   Status Achieved           PT Long Term Goals - 08/28/14 1044    PT LONG TERM GOAL #1   Title "Pt will be independent with advanced HEP.    Time 8   Period Weeks   Status On-going   PT LONG TERM GOAL #2   Title Left knee AROM flexion will improve to 2-110 degrees for improved mobility to ride in the car and negotiate curbs for community mobility.     Status Achieved   PT LONG TERM GOAL #3   Title Patient will have left LE grossly 4+/5 quads, HS,gluteals and gastroc needed for ambulating short community distances to get from the parking lot into the store and decreased incidence of knee give-way.   Time 8   Period Weeks   Status Partially Met   PT LONG TERM GOAL #4   Title Patient will have a gait speed of .74msec needed for community ambulation and decreased incidence of adverse events.   Status Achieved   PT LONG TERM GOAL #5   Title FOTO functional outcome score will improve from 58% limitation to 40% limitation indicating improved function with less pain.   Time 8   Period Weeks   Status On-going               Plan - 08/28/14 1051    Clinical Impression Statement Patient is progressing well with ROM and strengthening although bucking/knee give-way persists as well as mild knee pain. Min verbal cues to avoid knee hyperextension bilaterally.   Patient plans on going to the gym post discharge from PT.   Should meet remaining goals in 2-4 visits.   3/10 pain.  Patient declines the need for modalities.   Excellent improvement in 6 min walk test to 1513 feet.     PT Next Visit Plan Continue bike/Nu-Step; standing strengthening of quad motor control;  Progress walking endurance with monitoring of cardiac symptoms;   cryotherapy as needed        Problem List Patient Active Problem List   Diagnosis Date Noted  . Tricuspid regurgitation   . Asthma   . VSD (ventricular septal defect), perimembranous 09/03/2013  . Pulmonary hypertension 09/03/2013  .  Essential hypertension 08/06/2013  . Diabetes mellitus 08/06/2013  . Obesity 08/06/2013  . BP (high blood pressure) 07/27/2012  . Awareness of heartbeats 07/27/2012  . Absence of interventricular septum 07/27/2012  Alvera Singh 08/28/2014, 11:06 AM  [facility] [address] [place], [place], [address] Phone: [phone]   Fax:  [phone]  Ruben Im, PT 08/28/2014 11:07 AM Phone: [phone] Fax: [phone]

## 2014-09-02 ENCOUNTER — Encounter: Payer: Self-pay | Admitting: Cardiology

## 2014-09-04 ENCOUNTER — Ambulatory Visit: Payer: Medicaid Other | Admitting: Physical Therapy

## 2014-09-04 DIAGNOSIS — M25562 Pain in left knee: Secondary | ICD-10-CM

## 2014-09-04 NOTE — Therapy (Signed)
[facility] [address] [place], [place], [address] Phone: [phone]   Fax:  [phone]  Physical Therapy Treatment  Patient Details  Name: Mackenzie Patterson MRN: [id number] Date of Birth: 11/27/1970 Referring Provider:  Spruill, Jerome, MD  Encounter Date: 09/04/2014      PT End of Session - 09/04/14 1054    Visit Number 9   Number of Visits 16   Date for PT Re-Evaluation 09/19/14   PT Start Time 1010   PT Stop Time 1103   PT Time Calculation (min) 53 min   Activity Tolerance Patient tolerated treatment well      Past Medical History  Diagnosis Date  . Obesity   . Heart murmur   . Congenital ventricular septal defect   . Asthma   . Diabetes mellitus without complication   . VSD (ventricular septal defect), perimembranous     Qp:Qs 1.7:1 by catheterization   . Pulmonary hypertension     mild   . Tricuspid regurgitation     Past Surgical History  Procedure Laterality Date  . Tubal ligation    . Cyst excision    . Left and right heart catheterization with coronary angiogram [name] 09/11/2013    Procedure: LEFT AND RIGHT HEART CATHETERIZATION WITH CORONARY ANGIOGRAM;  Surgeon: Michael D Cooper, MD;  Location: [facility] CATH LAB;  Service: Cardiovascular;  Laterality: [name];    There were no vitals taken for this visit.  Visit Diagnosis:  Knee pain, acute, left      Subjective Assessment - 09/04/14 1008    Symptoms Going to [place] when she leaves here.  States she doesn't plan on swimming and she will take her cane.    Just taking her kids.  Walked 11 1/2 laps without her leg giving way.     Currently in Pain? No/denies   Pain Score 0-No pain   Aggravating Factors  prolonged walking, squatting   Pain Relieving Factors ice, Icy Hot                    [facility] Adult PT Treatment/Exercise - 09/04/14 1014    Knee/Hip Exercises: Aerobic   Isokinetic NuStep L4 7 min   Knee/Hip Exercises: Machines for  Strengthening   Cybex Knee Flexion Bilateral 2x10 30#   Total Gym Leg Press Bilateral 40# 35x; single leg 20# 20x; seat 6   Knee/Hip Exercises: Standing   Heel Raises 15 reps  alternating heel raises and minisquat on rocker board   Side Lunges Both;10 reps  red band around ankle   Hip ADduction Both;5 sets  wall isometrics abd and extension   Lateral Step Up Both;10 reps;Step Height: 6"   Functional Squat 10 reps   Wall Squat 10 reps  with ball behind back   SLS with Vectors Stand on blue foam 4 ways 10x   Other Standing Knee Exercises ball roll on wall with glut squeeze, quad set 10x   Other Standing Knee Exercises "wall bangers" 10x right and left   Knee/Hip Exercises: Supine   Bridges Strengthening;15 reps   Other Supine Knee Exercises bridge with ball curls 10x   Knee/Hip Exercises: Sidelying   Other Sidelying Knee Exercises modified side bridge 5x right/left   Knee/Hip Exercises: Prone   Other Prone Exercises modified planks 5 sec hold 10x   Cryotherapy   Number Minutes Cryotherapy 10 Minutes   Cryotherapy Location Knee   Type of Cryotherapy Ice pack                    PT Short Term Goals - 09/04/14 1058    PT SHORT TERM GOAL #1   Title "Independent with initial HEP   Status Achieved   PT SHORT TERM GOAL #2   Title "Demonstrate and verbalize understanding of condition management including RICE, positioning, use of A.D., HEP.    Status Achieved   PT SHORT TERM GOAL #3   Title Left knee flexion to 90 degrees needed for greater ease getting in/out of the car.   Status Achieved   PT SHORT TERM GOAL #4   Title Left ankle dorsiflexion to 0 degrees needed for ambulatating household distances and to the mailbox with less pain/difficulty   Status Achieved           PT Long Term Goals - 09/04/14 1058    PT LONG TERM GOAL #1   Title "Pt will be independent with advanced HEP.    Time 8   Period Weeks   Status On-going   PT LONG TERM GOAL #2   Title Left knee  AROM flexion will improve to 2-110 degrees for improved mobility to ride in the car and negotiate curbs for community mobility.     Status Achieved   PT LONG TERM GOAL #3   Title Patient will have left LE grossly 4+/5 quads, HS,gluteals and gastroc needed for ambulating short community distances to get from the parking lot into the store and decreased incidence of knee give-way.   Time 8   Period Weeks   Status Partially Met   PT LONG TERM GOAL #4   Title Patient will have a gait speed of .59msec needed for community ambulation and decreased incidence of adverse events.   Status Achieved   PT LONG TERM GOAL #5   Title FOTO functional outcome score will improve from 58% limitation to 40% limitation indicating improved function with less pain.   Time 8   Period Weeks   Status On-going               Plan - 09/04/14 1055    Clinical Impression Statement Patient is progressing well with ROM, strength, pain reduction and decreased incidence of buckling/give way.  Min verbal cues for patellofemoral alignment and to avoid knee hyperextension.  Should meet remaining goals in next 1-2 visits.     PT Next Visit Plan MMT LE; FOTO;  6 min walk test; recheck knee AROM; discharge in 1-2 visits.          Problem List Patient Active Problem List   Diagnosis Date Noted  . Tricuspid regurgitation   . Asthma   . VSD (ventricular septal defect), perimembranous 09/03/2013  . Pulmonary hypertension 09/03/2013  . Essential hypertension 08/06/2013  . Diabetes mellitus 08/06/2013  . Obesity 08/06/2013  . BP (high blood pressure) 07/27/2012  . Awareness of heartbeats 07/27/2012  . Absence of interventricular septum 07/27/2012    [name]3/03/2015, 11:00 AM  [place] [address]Phone: [phone]  Fax:  [phone]  SRuben Im PT 09/04/2014 11:00 AM Phone: [phone]:  [phone]

## 2014-09-11 ENCOUNTER — Ambulatory Visit: Payer: Medicaid Other | Admitting: Physical Therapy

## 2014-09-11 DIAGNOSIS — M25562 Pain in left knee: Secondary | ICD-10-CM

## 2014-09-11 NOTE — Therapy (Signed)
[place], [place], [address] Phone: [phone]   Fax:  [phone]  Physical Therapy Treatment  Patient Details  Name: Mackenzie Patterson MRN: [id number] Date of Birth: 04/24/1971 Referring Provider:  Wallene Huh, MD  Encounter Date: 09/11/2014      PT End of Session - 09/11/14 1054    Visit Number 10   Number of Visits 16   Date for PT Re-Evaluation 09/19/14   PT Start Time 1020   PT Stop Time 1108   PT Time Calculation (min) 48 min   Activity Tolerance Patient tolerated treatment well      Past Medical History  Diagnosis Date  . Obesity   . Heart murmur   . Congenital ventricular septal defect   . Asthma   . Diabetes mellitus without complication   . VSD (ventricular septal defect), perimembranous     Qp:Qs 1.7:1 by catheterization   . Pulmonary hypertension     mild   . Tricuspid regurgitation     Past Surgical History  Procedure Laterality Date  . Tubal ligation    . Cyst excision    . Left and right heart catheterization with coronary angiogram [name] 09/11/2013    Procedure: LEFT AND RIGHT HEART CATHETERIZATION WITH CORONARY ANGIOGRAM;  Surgeon: [name], MD;  Location: [facility] CATH LAB;  Service: Cardiovascular;  Laterality: [name];    There were no vitals filed for this visit.  Visit Diagnosis:  Knee pain, acute, left      Subjective Assessment - 09/11/14 1020    Symptoms Had a great time at [facility]. Walked on treadmill this morning 1 hour .76mh.  On Saturday she  states she went to the mall and both legs gave out.  She denies it was from overfatigue.  States she used her Hover 'Round.  Presents today without her cane.     Currently in Pain? No/denies   Pain Location Knee   Pain Orientation Left   Aggravating Factors  knee giveway with prolonged walking   Pain Relieving Factors ice, Icy Hot            [facility] - [facility]    Observation/Other  Assessments   Focus on Therapeutic Outcomes (FOTO)  68%  worsened from 58% (unsure why since patient is painfree)   AROM   Left Knee Extension 0   Left Knee Flexion 146   Left Ankle Dorsiflexion 11   Strength   Left Hip ABduction 4+/5   Left Knee Flexion 4+/5   Left Knee Extension 4+/5   Left Ankle Plantar Flexion 5/5   Flexibility   Soft Tissue Assessment /Muscle Length --  HS 90 degrees   Ambulation/Gait   Ambulation/Gait --  TUG 5.1 sec WNLs   Gait velocity 2.04 m/sec   Door Management 7: Independent;Other (comment)  one at a time secondary to fear of possible give way                   [facility] - [facility] 1030    Knee/Hip Exercises: Aerobic   Isokinetic NuStep L5 8 min   Knee/Hip Exercises: Machines for Strengthening   Cybex Knee Extension 30# Bilateral 20x   Knee/Hip Exercises: Standing   Rocker Board 5 minutes  with UE and head movements to challenge balance   Walking with Sports Cord Plate C 5x FW and BW  side stepping R/L 5x Plate C   Other Standing Knee  Exercises Up and down steps one at a time 3x   Cryotherapy   Number Minutes Cryotherapy 10 Minutes   Cryotherapy Location Knee   Type of Cryotherapy Ice pack                  PT Short Term Goals - 09/11/14 1038    PT SHORT TERM GOAL #1   Title "Independent with initial HEP   Status Achieved   PT SHORT TERM GOAL #2   Title "Demonstrate and verbalize understanding of condition management including RICE, positioning, use of A.D., HEP.    Status Achieved   PT SHORT TERM GOAL #3   Title Left knee flexion to 90 degrees needed for greater ease getting in/out of the car.   Status Achieved   PT SHORT TERM GOAL #4   Title Left ankle dorsiflexion to 0 degrees needed for ambulatating household distances and to the mailbox with less pain/difficulty   Status Achieved           PT Long Term Goals - 09/11/14 1038    PT LONG TERM GOAL #1   Title "Pt will be independent  with advanced HEP.    Status Achieved   PT LONG TERM GOAL #2   Title Left knee AROM flexion will improve to 2-110 degrees for improved mobility to ride in the car and negotiate curbs for community mobility.     Status Achieved   PT LONG TERM GOAL #3   Title Patient will have left LE grossly 4+/5 quads, HS,gluteals and gastroc needed for ambulating short community distances to get from the parking lot into the store and decreased incidence of knee give-way.   Status Achieved   PT LONG TERM GOAL #4   Title Patient will have a gait speed of .35msec needed for community ambulation and decreased incidence of adverse events.   Status Achieved   PT LONG TERM GOAL #5   Title FOTO functional outcome score will improve from 58% limitation to 40% limitation indicating improved function with less pain.   Status Not Met               Plan - 09/11/14 1801    PT Next Visit Plan Discharge to independent HEP with majority of goals met.  Patient is in agreement.          Problem List Patient Active Problem List   Diagnosis Date Noted  . Tricuspid regurgitation   . Asthma   . VSD (ventricular septal defect), perimembranous 09/03/2013  . Pulmonary hypertension 09/03/2013  . Essential hypertension 08/06/2013  . Diabetes mellitus 08/06/2013  . Obesity 08/06/2013  . BP (high blood pressure) 07/27/2012  . Awareness of heartbeats 07/27/2012  . Absence of interventricular septum 07/27/2012    Mackenzie Singh3/17/2016, 6:03 PM  [facility] [address]Phone: [phone]  Fax:  [phone] THERAPY DISCHARGE SUMMARY  Visits from Start of Care: 10  Current functional level related to goals / functional outcomes: Patient has full knee AROM, good strength, normal walking strength.  She is independent with HEP for further strengthening.     Remaining deficits: Patient has periodic knee give way  outside of the clinic.  She plans to use a cane in the community for safety and ascend steps and descend steps one at a time.  She will continue HEP on her own for further strengthening which will hopefully reduce incidence of give-way.  Education / Equipment: HEP Plan: Patient agrees to discharge.  Patient goals were partially met. Patient is being discharged due to meeting the stated rehab goals.  ?????      Ruben Im, PT 09/11/2014 6:04 PM Phone: [phone] Fax: [phone]

## 2014-09-14 ENCOUNTER — Encounter (HOSPITAL_COMMUNITY): Payer: Self-pay | Admitting: *Deleted

## 2014-09-14 ENCOUNTER — Emergency Department (HOSPITAL_COMMUNITY)
Admission: EM | Admit: 2014-09-14 | Discharge: 2014-09-14 | Disposition: A | Discharge status: Home / Self Care | Payer: Medicaid Other | Attending: Emergency Medicine | Admitting: Emergency Medicine

## 2014-09-14 DIAGNOSIS — J45909 Unspecified asthma, uncomplicated: Secondary | ICD-10-CM | POA: Insufficient documentation

## 2014-09-14 DIAGNOSIS — Z9889 Other specified postprocedural states: Secondary | ICD-10-CM | POA: Insufficient documentation

## 2014-09-14 DIAGNOSIS — E669 Obesity, unspecified: Secondary | ICD-10-CM | POA: Diagnosis not present

## 2014-09-14 DIAGNOSIS — Z79899 Other long term (current) drug therapy: Secondary | ICD-10-CM | POA: Diagnosis not present

## 2014-09-14 DIAGNOSIS — R1030 Lower abdominal pain, unspecified: Secondary | ICD-10-CM | POA: Insufficient documentation

## 2014-09-14 DIAGNOSIS — Z91040 Latex allergy status: Secondary | ICD-10-CM | POA: Diagnosis not present

## 2014-09-14 DIAGNOSIS — Q210 Ventricular septal defect: Secondary | ICD-10-CM | POA: Insufficient documentation

## 2014-09-14 DIAGNOSIS — Z9851 Tubal ligation status: Secondary | ICD-10-CM | POA: Diagnosis not present

## 2014-09-14 DIAGNOSIS — R011 Cardiac murmur, unspecified: Secondary | ICD-10-CM | POA: Diagnosis not present

## 2014-09-14 DIAGNOSIS — Z880 Allergy status to penicillin: Secondary | ICD-10-CM | POA: Insufficient documentation

## 2014-09-14 DIAGNOSIS — Z8679 Personal history of other diseases of the circulatory system: Secondary | ICD-10-CM | POA: Diagnosis not present

## 2014-09-14 DIAGNOSIS — Z3202 Encounter for pregnancy test, result negative: Secondary | ICD-10-CM | POA: Insufficient documentation

## 2014-09-14 DIAGNOSIS — E119 Type 2 diabetes mellitus without complications: Secondary | ICD-10-CM | POA: Diagnosis not present

## 2014-09-14 LAB — URINALYSIS, ROUTINE W REFLEX MICROSCOPIC
Bilirubin Urine: NEGATIVE
GLUCOSE, UA: 100 mg/dL — AB
Hgb urine dipstick: NEGATIVE
Ketones, ur: NEGATIVE mg/dL
Leukocytes, UA: NEGATIVE
NITRITE: NEGATIVE
Protein, ur: NEGATIVE mg/dL
Specific Gravity, Urine: 1.018 (ref 1.005–1.030)
Urobilinogen, UA: 0.2 mg/dL (ref 0.0–1.0)
pH: 5 (ref 5.0–8.0)

## 2014-09-14 LAB — PREGNANCY, URINE: Preg Test, Ur: NEGATIVE

## 2014-09-14 MED ORDER — IBUPROFEN 600 MG PO TABS: 600.0000 mg | ORAL_TABLET | Freq: Three times a day (TID) | ORAL | Status: DC | PRN

## 2014-09-14 MED ORDER — OXYCODONE-ACETAMINOPHEN 5-325 MG PO TABS
1.0000 | ORAL_TABLET | Freq: Once | ORAL | Status: AC
  Administered 2014-09-14: 1 via ORAL
  Filled 2014-09-14: qty 1

## 2014-09-14 NOTE — ED Notes (Signed)
Pt presents to ED with c/o lower abdominal/ pelvic bone pain.

## 2014-09-14 NOTE — ED Notes (Signed)
Questions, concerns denied r/t pt stable at time of dc

## 2014-09-14 NOTE — ED Provider Notes (Signed)
CSN: [id number]     Arrival date & time 09/14/14  3557 History   First MD Initiated Contact with Patient 09/14/14 1002     Chief Complaint  Patient presents with  . Abdominal Pain     HPI Patient states a history of recurrent lower abdominal pain over the past 20 years.  No clear diagnosis has been made to this point.  She has seen her gynecologist in her primary care physician.  She presents the emergency department today because of abdominal pain in her lower abdomen that began yesterday.  She denies nausea and vomiting.  She denies back pain.  No dysuria or urinary frequency.  She denies vaginal symptoms.  She states this feels like her prior lower abdominal pain.  She tried over-the-counter pain medications including ibuprofen without improvement in her symptoms.  At this time her pain is mild to moderate in severity.  Review the medical record demonstrates she is scheduled for an ultrasound of her uterus and possible endometrial biopsy within the next 4 weeks as ordered by her gynecologist.  The patient is unaware of any upcoming testing.   Past Medical History  Diagnosis Date  . Obesity   . Heart murmur   . Congenital ventricular septal defect   . Asthma   . Diabetes mellitus without complication   . VSD (ventricular septal defect), perimembranous     Qp:Qs 1.7:1 by catheterization   . Pulmonary hypertension     mild   . Tricuspid regurgitation    Past Surgical History  Procedure Laterality Date  . Tubal ligation    . Cyst excision    . Left and right heart catheterization with coronary angiogram [name] 09/11/2013    Procedure: LEFT AND RIGHT HEART CATHETERIZATION WITH CORONARY ANGIOGRAM;  Surgeon: [name], MD;  Location: [facility] CATH LAB;  Service: Cardiovascular;  Laterality: [name];   Family History  Problem Relation Age of Onset  . Cancer Other   . Hyperlipidemia Other   . Hypertension Other   . Asthma Other    History  Substance Use Topics  . Smoking status: Never  Smoker   . Smokeless tobacco: Never Used  . Alcohol Use: No   OB History    No data available     Review of Systems  All other systems reviewed and are negative.     Allergies  Penicillins; Latex; and Other  Home Medications   Prior to Admission medications   Medication Sig Start Date End Date Taking? Authorizing Provider  Alogliptin-Metformin HCl (KAZANO) 12.10-998 MG TABS Take 1 tablet by mouth 2 (two) times daily.    Yes Historical Provider, MD  amLODipine (NORVASC) 2.5 MG tablet Take 1 tablet (2.5 mg total) by mouth daily. 03/25/14  Yes [name], MD  nitroGLYCERIN (NITROSTAT) 0.4 MG SL tablet Place 0.4 mg under the tongue every 5 (five) minutes as needed for chest pain.   Yes Historical Provider, MD  albuterol (PROVENTIL) (2.5 MG/3ML) 0.083% nebulizer solution Take 2.5 mg by nebulization every 6 (six) hours as needed for wheezing or shortness of breath.    Historical Provider, MD  ibuprofen (ADVIL,MOTRIN) 600 MG tablet Take 1 tablet (600 mg total) by mouth every 8 (eight) hours as needed. 09/14/14   Jola Schmidt, MD  metoprolol tartrate (LOPRESSOR) 25 MG tablet Take 1 tablet (25 mg total) by mouth 2 (two) times daily. Patient not taking: Reported on 09/14/2014 03/25/14   [name], MD   BP 113/78 mmHg  Pulse  73  Temp(Src) 98 F (36.7 C) (Oral)  Resp 17  SpO2 100%  LMP 08/28/2014 Physical Exam  Constitutional: She is oriented to person, place, and time. She appears well-developed and well-nourished. No distress.  HENT:  Head: Normocephalic and atraumatic.  Eyes: EOM are normal.  Neck: Normal range of motion.  Cardiovascular: Normal rate, regular rhythm and normal heart sounds.   Pulmonary/Chest: Effort normal and breath sounds normal.  Abdominal: Soft. She exhibits no distension. There is no tenderness.  Musculoskeletal: Normal range of motion.  Neurological: She is alert and oriented to person, place, and time.  Skin: Skin is warm and dry.   Psychiatric: She has a normal mood and affect. Judgment normal.  Nursing note and vitals reviewed.   ED Course  Procedures (including critical care time) Labs Review Labs Reviewed  URINALYSIS, ROUTINE W REFLEX MICROSCOPIC - Abnormal; Notable for the following:    APPearance CLOUDY (*)    Glucose, UA 100 (*)    All other components within normal limits  PREGNANCY, URINE    Imaging Review No results found.   EKG Interpretation None      MDM   Final diagnoses:  Lower abdominal pain    Vital signs are normal.  Patient with upcoming imaging and endometrial biopsy as scheduled by her OB/GYN.  If recommended that she discuss this with her gynecologist.  Close follow-up with her primary care physician.  No indication for imaging today.  Urine normal.  No signs of urinary tract infection.  Home with anti-inflammatories.    Jola Schmidt, MD 09/14/14 1120

## 2014-09-14 NOTE — Discharge Instructions (Signed)

## 2014-10-20 ENCOUNTER — Other Ambulatory Visit: Payer: Self-pay | Admitting: *Deleted

## 2014-10-20 ENCOUNTER — Encounter: Payer: Self-pay | Admitting: *Deleted

## 2014-10-21 ENCOUNTER — Other Ambulatory Visit: Payer: Self-pay

## 2014-10-21 DIAGNOSIS — N6452 Nipple discharge: Secondary | ICD-10-CM

## 2014-10-22 ENCOUNTER — Ambulatory Visit (INDEPENDENT_AMBULATORY_CARE_PROVIDER_SITE_OTHER): Payer: Medicaid Other | Admitting: Cardiology

## 2014-10-22 ENCOUNTER — Encounter: Payer: Self-pay | Admitting: Cardiology

## 2014-10-22 VITALS — BP 120/74 | HR 83 | Ht 59.0 in | Wt 163.0 lb

## 2014-10-22 DIAGNOSIS — I272 Pulmonary hypertension, unspecified: Secondary | ICD-10-CM

## 2014-10-22 DIAGNOSIS — I270 Primary pulmonary hypertension: Secondary | ICD-10-CM | POA: Diagnosis not present

## 2014-10-22 DIAGNOSIS — Q210 Ventricular septal defect: Secondary | ICD-10-CM | POA: Diagnosis not present

## 2014-10-22 DIAGNOSIS — Z01812 Encounter for preprocedural laboratory examination: Secondary | ICD-10-CM | POA: Diagnosis not present

## 2014-10-22 DIAGNOSIS — R002 Palpitations: Secondary | ICD-10-CM

## 2014-10-22 MED ORDER — ASPIRIN EC 81 MG PO TBEC: 81.0000 mg | DELAYED_RELEASE_TABLET | Freq: Every day | ORAL | Status: DC

## 2014-10-22 NOTE — Patient Instructions (Signed)
Medication Instructions:   Your physician has recommended you make the following change in your medication:   START TAKING ASPIRIN 81 MG ONCE DAILY   Labwork:    PLEASE CALL BACK TOMORROW TO HAVE YOUR PRE-PROCEDURE CATH LAB APPT SET UP-- PT/INR, CMET, CBC W DIFF   Testing/Procedures:  Your physician has requested that you have a RIGHT cardiac catheterization. Cardiac catheterization is used to diagnose and/or treat various heart conditions. Doctors may recommend this procedure for a number of different reasons. The most common reason is to evaluate chest pain. Chest pain can be a symptom of coronary artery disease (CAD), and cardiac catheterization can show whether plaque is narrowing or blocking your heart's arteries. This procedure is also used to evaluate the valves, as well as measure the blood flow and oxygen levels in different parts of your heart. For further information please visit [email or web address]. Please follow instruction sheet, as given.  PLEASE CONTACT OUR OFFICE TOMORROW 10/23/14 TO GET YOUR CARDIAC CATH SCHEDULED    Follow-Up:  Your physician wants you to follow-up in: [place] will receive a reminder letter in the mail two months in advance. If you don't receive a letter, please call our office to schedule the follow-up appointment.   Your physician recommends that you schedule a follow-up appointment in: WITH CARDIOTHORACIC SURGERY (DR Roxy Manns) AT THEIR VERY NEXT AVAILABLE     Any Other Special Instructions Will Be Listed Below (If Applicable).  --PLEASE CONTACT OUR OFFICE TOMORROW 10/23/14 AND ASK TO SPEAK WITH DR NELSON'S NURSE OR A TRIAGE NURSE TO GET YOUR RIGHT CARDIAC CATH SET UP--

## 2014-10-22 NOTE — Progress Notes (Signed)
[name], female   DOB: September 20, 1970, 44 y.o.   MRN: [id number]    Patient Name: Mackenzie Patterson Date of Encounter: 10/22/2014  Primary Care Provider:  [name], MD Primary Cardiologist:  Mackenzie Patterson  Problem List   Past Medical History  Diagnosis Date  . Obesity   . Heart murmur   . Congenital ventricular septal defect   . Asthma   . Diabetes mellitus without complication   . VSD (ventricular septal defect), perimembranous     Qp:Qs 1.7:1 by catheterization   . Pulmonary hypertension     mild   . Tricuspid regurgitation    Past Surgical History  Procedure Laterality Date  . Tubal ligation    . Cyst excision    . Left and right heart catheterization with coronary angiogram [name] 09/11/2013    Procedure: LEFT AND RIGHT HEART CATHETERIZATION WITH CORONARY ANGIOGRAM;  Surgeon: Mackenzie Ohara, MD;  Location: [facility] CATH LAB;  Service: Cardiovascular;  Laterality: [name];   Allergies  Allergies  Allergen Reactions  . Penicillins Anaphylaxis  . Tape Rash    Other reaction(s): Other (See Comments) Burns skin  . Other Rash    Other reaction(s): Other (See Comments) **EKG GEL PADS**  "Burns my skin" Pt states she is allergic to a blood pressure medication, unsure.  **EKG GEL PADS**  "Burns my skin"  . 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 The patient is not a good historian and is not sure if she was followed by cardiologists in the past and states that her primary care physician is her heart doctor. She states that 2 years ago she was referred to [facility] where she was offered an option of surgical closure of her VSD or medical management and she decided not to undergo surgery.  In June of 2014 she had a syncopal episode associated with chest pain and underwent a Lexiscan  nuclear stress test that showed  normal left ventricular ejection fraction fraction, no scar or ischemia. An echocardiogram at that time showed mildly dilated left ventricle with preserved ejection fraction, membranous VSD not further specified and severe pulmonary hypertension with preserved right ventricular size and function.  She came in March 2015 with palpitations that start after moderate activity and it can last up to 20-30 minutes. At the time she was also symptomatic with 2 pillow orthopnea, PND and progressively worsening dyspnea on exertion like walking to the mailbox that is about 50 feet from her house will make her short of breath.   She saw cardiothoracic surgeon Dr Mackenzie Patterson who stated:  Impression:  Patient has a small-to-moderate sized perimembranous ventricular septal defect associated with progressive symptoms of chronic diastolic congestive heart failure, currently NYHA functional class III at least. She also experiences atypical chest pain, tachypalpitations, dizzy spells and occasional syncope. Catheterization is notable for the absence of coronary artery disease and only mild pulmonary hypertension. Echocardiogram is notable for the absence of any associated aortic insufficiency, normal left and right ventricular function, and at least moderate tricuspid regurgitation. Options include continued medical therapy versus elective surgical closure with or without possible tricuspid valve repair. Given her progressive symptoms and the development of mild pulmonary hypertension, I would favor proceeding with elective surgery.  Plan:  I discussed options at length with the patient in the office today. We discussed continued medical therapy, elective  surgery, or referral to a tertiary care center. I explained that we perform surgical repair on adults with congenital heart disease at [facility], but we do not have a pediatric congenital heart disease program. Although some centers approach closure of perimembranous ventricular septal  defects using minimally invasive surgical approach, I would favor a conventional sternotomy. The risks of surgery were discussed at length. All of her questions been addressed. The patient wants to discuss matters with her family and return in 2 or 3 weeks with her father present before making a final decision.  However, the patient after discussion with her father refused to undergo surgery.   10/22/2014 - the patient is coming up in 6 months, her symptoms continued to deteriorate. Her dyspnea on exertion is worsening now with minimal exertion like walking one flight of stairs. About 3 times a month she will have an episode of paroxysmal nocturnal dyspnea. She denies any recent syncope but continues having frequent heart palpitations without any associated dizziness or chest pain. She stopped using Lasix as she ran out of refills.  She presented today with worsening symptoms, there is worsening orthopnea, she has to sleep sitting up, PND, dyspnea on minimal exertion. Her most recent echocardiogram from 03/21/2014 shows progression pf pulmonary hypertension from mild to severe range.   Home Medications  Prior to Admission medications   Medication Sig Start Date End Date Taking? Authorizing Provider  Alogliptin-Metformin HCl (KAZANO) 12.10-998 MG TABS Take 1 tablet by mouth 2 (two) times daily.   Yes Historical Provider, MD  amLODipine (NORVASC) 10 MG tablet Take 5 mg by mouth every morning.    Yes Historical Provider, MD  nitroGLYCERIN (NITROSTAT) 0.4 MG SL tablet Place 0.4 mg under the tongue every 5 (five) minutes as needed for chest pain.   Yes Historical Provider, MD  ondansetron (ZOFRAN) 4 MG tablet Take 1 tablet (4 mg total) by mouth every 6 (six) hours. 06/24/13  Yes Delice Bison Ward, DO    Family History  Family History  Problem Relation Age of Onset  . Cancer Other   . Hyperlipidemia Other   . Hypertension Other   . Asthma Other     Social History  History   Social History  .  Marital Status: Single    Spouse Name: [name]  . Number of Children: [name]  . Years of Education: [name]   Occupational History  . Not on file.   Social History Main Topics  . Smoking status: Never Smoker   . Smokeless tobacco: Never Used  . Alcohol Use: No  . Drug Use: No  . Sexual Activity: Yes    Birth Control/ Protection: Pill   Other Topics Concern  . Not on file   Social History Narrative     Review of Systems, as per HPI, otherwise negative General:  No chills, fever, night sweats or weight changes.  Cardiovascular:  No chest pain, dyspnea on exertion, edema, orthopnea, palpitations, paroxysmal nocturnal dyspnea. Dermatological: No rash, lesions/masses Respiratory: No cough, dyspnea Urologic: No hematuria, dysuria Abdominal:   No nausea, vomiting, diarrhea, bright red blood per rectum, melena, or hematemesis Neurologic:  No visual changes, wkns, changes in mental status. All other systems reviewed and are otherwise negative except as noted above.  Physical Exam  Blood pressure 120/74, pulse 83, height 4\' 11"  (1.499 m), weight 163 lb (73.936 kg), SpO2 98 %.  General: Pleasant, NAD Psych: Normal affect. Neuro: Alert and oriented X 3. Moves all extremities spontaneously. HEENT: Normal  Neck: Supple  without bruits, JVD + 8 cm Lungs:  Resp regular and unlabored, CTA. Heart: RRR no s3, s4, harsh 5/6 holosystolic murmur at the LLSB. Abdomen: Soft, non-tender, non-distended, BS + x 4.  Extremities: No clubbing, cyanosis or edema. DP/PT/Radials 2+ and equal bilaterally.  Labs:  No results for input(s): CKTOTAL, CKMB, TROPONINI in the last 72 hours. Lab Results  Component Value Date   WBC 7.3 01/05/2014   HGB 13.7 01/05/2014   HCT 40.6 01/05/2014   MCV 86.6 01/05/2014   PLT 251 01/05/2014   No results for input(s): NA, K, CL, CO2, BUN, CREATININE, CALCIUM, PROT, BILITOT, ALKPHOS, ALT, AST, GLUCOSE in the last 168 hours.  Invalid input(s): LABALBU Lab Results  Component  Value Date   CHOL 149 12/08/2012   HDL 41 12/08/2012   LDLCALC 97 12/08/2012   TRIG 57 12/08/2012   Lab Results  Component Value Date   DDIMER <0.27 01/05/2014   Accessory Clinical Findings  Echocardiogram 08/23/2013  - Left ventricle: The cavity size was mildly dilated. Wall thickness was normal. Systolic function was normal. The estimated ejection fraction was in the range of 60% to 65%. - Left atrium: The atrium was mildly dilated. - Atrial septum: No defect or patent foramen ovale was identified. - Tricuspid valve: Degree of TR and estimation of PA pressure difficult due to contamination of TR jet from VSD Mild-moderate regurgitation. - Pulmonary arteries: PA peak pressure: 73mm Hg (S). QP/QS 2.2 - Pericardium, extracardiac: A trivial pericardial effusion was identified posterior to the heart. - Impressions: Some what large peri membranous VSD. Suggest QP/QS and right heart cath at some point. No RV/RA dilatation TR velocity contaminated by VSD flow so hard to estimate PA pressure. Elevated EDP by E/E; ratio Impressions:  - Some what large peri membranous VSD. Suggest QP/QS and right heart cath at some point. No RV/RA dilatation TR velocity contaminated by VSD flow so hard to estimate PA pressure. Elevated EDP by E/E; ratio  ECHO: 03/21/2014 Study Conclusions  - Left ventricle: The cavity size was normal. Systolic function was normal. The estimated ejection fraction was in the range of 50% to 55%. Wall motion was normal; there were no regional wall motion abnormalities. Doppler parameters are consistent with abnormal left ventricular relaxation (grade 1 diastolic dysfunction). - Ventricular septum: Ventricular septum: There is a restrictive membranous VSD - peak gradient across the defect is 153 mmHg. - Aortic valve: There was no regurgitation. - Aortic root: The aortic root was normal in size. - Left atrium: The atrium was normal in size. - Right ventricle:  Systolic function was normal. - Right atrium: The atrium was mildly dilated. - Tricuspid valve: There was moderate regurgitation. - Pulmonary arteries: Systolic pressure was severely increased. PA peak pressure: 74 mm Hg (S). - Pericardium, extracardiac: A trivial pericardial effusion was identified.  Impressions:  - Compared to the prior study on 5/28/ 2015 there is now moderate tricuspid regurgitation and severe pulmonary hypertension. RVSP 74 mmHg, previously 32 mmHg.   Lexiscan nuclear stress test 12/09/2012  Findings: QGS ejection fraction measures 62% , with an end- diastolic volume of 102 ml and an end-systolic volume of 41 ml.  IMPRESSION: No evidence for pharmacologically induced ischemia.  Normal wall motion and calculated ejection fraction is 62%.  ECG - 05/19/13 SR, LVH, early repolarization in the inferolateral leads  Cath 08/2013 Final Conclusions:  1. Normal coronary arteries  2. Ventricular septal defect with QP:QS of 1.7  3. Normal right heart pressures with the exception  of minimal pulmonary HTN (PVR less than 2 Woods units)  Sherren Mocha  09/11/2013, 9:28 AM   Assessment & Plan  A 44 year old female with   1. Congenital membranous ventricular septal defect. The patient has progressively worsening symptoms of tachypalpitations, DOE, CHF NYHA class III.  She now has worsening pulmonary hypertension from previously mild to severe, RVSP 74 mmHg on echocardiogram on 03/21/2014. Right sided cath performed in March 2015 showed PVR less than 2 Wood units consistent with minimal pulmonary hypertension. The patient states that she would now consider surgical treatment, we'll schedule another right-sided For evaluation of pulmonary hypertension reversibility. She will need to be referred to CT surgery again she would like to see a different surgeon than Dr Mackenzie Patterson.  The condition and poor prognosis if left untreated was discussed in length with the patient.  The  patient states that she went to [facility] for second opinion and was told that at this point she doesn't need surgery yet, I'm not sure about the reliability of her report.   2. Palpitations - only sinus tachycardia and PACs on Holter monitor, we will ad metoprolol 25 mg po bid for symptoms improvement  3. Hypertension - controlled on current regimen  4. Syncope - possibly related to pulmonary hypertension  Followup in 6 months.   Mackenzie Spark, MD, [facility] 10/22/2014, 2:57 PM

## 2014-10-23 ENCOUNTER — Telehealth: Payer: Self-pay | Admitting: *Deleted

## 2014-10-23 ENCOUNTER — Encounter: Payer: Self-pay | Admitting: *Deleted

## 2014-10-23 NOTE — Telephone Encounter (Signed)
Pt calling back today to schedule her right sided cath for next Thursday 10/30/14, as instructed by Dr Meda Coffee at yesterday's office visit with the pt.  Scheduled the pt for a right-sided cardiac cath for next Thursday 10/30/14 at 9am with Dr Martinique at [facility].  Pts right sided cath will be for VSD and Pulmonary HTN.  Pt made aware to be at her scheduled cath for 10/30/14 at 7 am at [facility] [place] stay.  Pt will come in for pre-procedure labs scheduled for next Monday 10/27/14 at our office to check a PT/INR, CMET, and CBC W DIFF.  Pt aware that when she comes for her lab appt to ask the greeter at the front desk for her cath letter of instruction to pick up per Dr Meda Coffee.  Informed the pt that if she has any questions in regards to her cath instructions, she can ask to speak with a triage nurse to go over this.  Went over cath instructions briefly over the phone with the pt. Pt verbalized understanding and agrees with this plan.  Will inform Dr Meda Coffee to place hospital orders into the system for pts cath on 10/30/14 with Dr Martinique at 9 am.

## 2014-10-23 NOTE — Addendum Note (Signed)
Addended by: [name] on: 10/23/2014 03:07 PM   Modules accepted: Orders

## 2014-10-24 ENCOUNTER — Other Ambulatory Visit: Payer: Self-pay | Admitting: Cardiology

## 2014-10-27 ENCOUNTER — Other Ambulatory Visit: Payer: Medicaid Other

## 2014-10-27 ENCOUNTER — Other Ambulatory Visit (INDEPENDENT_AMBULATORY_CARE_PROVIDER_SITE_OTHER): Payer: Medicaid Other | Admitting: *Deleted

## 2014-10-27 DIAGNOSIS — R002 Palpitations: Secondary | ICD-10-CM

## 2014-10-27 DIAGNOSIS — I272 Pulmonary hypertension, unspecified: Secondary | ICD-10-CM

## 2014-10-27 DIAGNOSIS — Q210 Ventricular septal defect: Secondary | ICD-10-CM

## 2014-10-27 DIAGNOSIS — Z01812 Encounter for preprocedural laboratory examination: Secondary | ICD-10-CM | POA: Diagnosis not present

## 2014-10-27 DIAGNOSIS — I270 Primary pulmonary hypertension: Secondary | ICD-10-CM | POA: Diagnosis not present

## 2014-10-27 LAB — CBC WITH DIFFERENTIAL/PLATELET
Basophils Absolute: 0 10*3/uL (ref 0.0–0.1)
Basophils Relative: 0.4 % (ref 0.0–3.0)
Eosinophils Absolute: 0.1 10*3/uL (ref 0.0–0.7)
Eosinophils Relative: 1.5 % (ref 0.0–5.0)
HCT: 33.6 % — ABNORMAL LOW (ref 36.0–46.0)
Hemoglobin: 11 g/dL — ABNORMAL LOW (ref 12.0–15.0)
Lymphocytes Relative: 29.4 % (ref 12.0–46.0)
Lymphs Abs: 1.3 10*3/uL (ref 0.7–4.0)
MCHC: 32.8 g/dL (ref 30.0–36.0)
MCV: 78 fl (ref 78.0–100.0)
Monocytes Absolute: 0.4 10*3/uL (ref 0.1–1.0)
Monocytes Relative: 9.8 % (ref 3.0–12.0)
Neutro Abs: 2.7 10*3/uL (ref 1.4–7.7)
Neutrophils Relative %: 58.9 % (ref 43.0–77.0)
Platelets: 250 10*3/uL (ref 150.0–400.0)
RBC: 4.3 Mil/uL (ref 3.87–5.11)
RDW: 15.6 % — ABNORMAL HIGH (ref 11.5–15.5)
WBC: 4.5 10*3/uL (ref 4.0–10.5)

## 2014-10-27 LAB — COMPREHENSIVE METABOLIC PANEL
ALT: 11 U/L (ref 0–35)
AST: 15 U/L (ref 0–37)
Albumin: 3.6 g/dL (ref 3.5–5.2)
Alkaline Phosphatase: 59 U/L (ref 39–117)
BUN: 8 mg/dL (ref 6–23)
CO2: 27 mEq/L (ref 19–32)
Calcium: 8.9 mg/dL (ref 8.4–10.5)
Chloride: 103 mEq/L (ref 96–112)
Creatinine, Ser: 0.77 mg/dL (ref 0.40–1.20)
GFR: 105.36 mL/min (ref >60.00)
Glucose, Bld: 128 mg/dL — ABNORMAL HIGH (ref 70–99)
Potassium: 4.1 mEq/L (ref 3.5–5.1)
Sodium: 135 mEq/L (ref 135–145)
Total Bilirubin: 0.5 mg/dL (ref 0.2–1.2)
Total Protein: 6.7 g/dL (ref 6.0–8.3)

## 2014-10-27 LAB — PROTIME-INR
INR: 1 ratio (ref 0.8–1.0)
Prothrombin Time: 11.5 s (ref 9.6–13.1)

## 2014-10-27 NOTE — Addendum Note (Signed)
Addended by: Eulis Foster on: 10/27/2014 10:45 AM   Modules accepted: Orders

## 2014-10-28 ENCOUNTER — Telehealth: Payer: Self-pay | Admitting: Cardiology

## 2014-10-28 NOTE — Telephone Encounter (Signed)
Called patient and advised that it is OK to take baby ASA 81 mg the day of her heart cath with small sip of water. Patient verbalized understanding.

## 2014-10-28 NOTE — Telephone Encounter (Signed)
Follow Up  Pt called to follow Up from prev message. Please call

## 2014-10-30 ENCOUNTER — Ambulatory Visit (HOSPITAL_COMMUNITY)
Admission: RE | Admit: 2014-10-30 | Payer: No Typology Code available for payment source | Source: Ambulatory Visit | Admitting: Cardiology

## 2014-10-30 ENCOUNTER — Encounter (HOSPITAL_COMMUNITY)
Admission: RE | Disposition: A | Discharge status: Home / Self Care | Payer: Medicaid Other | Source: Ambulatory Visit | Attending: Cardiology

## 2014-10-30 ENCOUNTER — Encounter (HOSPITAL_COMMUNITY): Admission: RE | Payer: Self-pay | Source: Ambulatory Visit

## 2014-10-30 ENCOUNTER — Encounter (HOSPITAL_COMMUNITY): Payer: Self-pay | Admitting: Cardiology

## 2014-10-30 ENCOUNTER — Ambulatory Visit (HOSPITAL_COMMUNITY)
Admission: RE | Admit: 2014-10-30 | Discharge: 2014-10-30 | Disposition: A | Discharge status: Home / Self Care | Payer: Medicaid Other | Source: Ambulatory Visit | Attending: Cardiology | Admitting: Cardiology

## 2014-10-30 DIAGNOSIS — Q211 Atrial septal defect: Secondary | ICD-10-CM | POA: Insufficient documentation

## 2014-10-30 DIAGNOSIS — I10 Essential (primary) hypertension: Secondary | ICD-10-CM | POA: Insufficient documentation

## 2014-10-30 DIAGNOSIS — I270 Primary pulmonary hypertension: Secondary | ICD-10-CM

## 2014-10-30 DIAGNOSIS — I272 Other secondary pulmonary hypertension: Secondary | ICD-10-CM | POA: Insufficient documentation

## 2014-10-30 DIAGNOSIS — Q210 Ventricular septal defect: Secondary | ICD-10-CM | POA: Diagnosis not present

## 2014-10-30 DIAGNOSIS — E119 Type 2 diabetes mellitus without complications: Secondary | ICD-10-CM | POA: Insufficient documentation

## 2014-10-30 HISTORY — PX: CARDIAC CATHETERIZATION: SHX172

## 2014-10-30 LAB — GLUCOSE, CAPILLARY: Glucose-Capillary: 121 mg/dL — ABNORMAL HIGH (ref 70–99)

## 2014-10-30 SURGERY — RIGHT HEART CATH

## 2014-10-30 SURGERY — LEFT HEART CATHETERIZATION WITH CORONARY ANGIOGRAM

## 2014-10-30 SURGERY — LEFT HEART CATH AND CORS/GRAFTS ANGIOGRAPHY

## 2014-10-30 MED ORDER — SODIUM CHLORIDE 0.9 % IV SOLN: 250.0000 mL | INTRAVENOUS | Status: DC | PRN

## 2014-10-30 MED ORDER — ASPIRIN 81 MG PO CHEW: 81.0000 mg | CHEWABLE_TABLET | ORAL | Status: DC

## 2014-10-30 MED ORDER — ACETAMINOPHEN 325 MG PO TABS
650.0000 mg | ORAL_TABLET | Freq: Once | ORAL | Status: AC
  Administered 2014-10-30: 650 mg via ORAL

## 2014-10-30 MED ORDER — ACETAMINOPHEN 325 MG PO TABS
ORAL_TABLET | ORAL | Status: AC
  Filled 2014-10-30: qty 2

## 2014-10-30 MED ORDER — SODIUM CHLORIDE 0.9 % IJ SOLN: 3.0000 mL | Freq: Two times a day (BID) | INTRAMUSCULAR | Status: DC

## 2014-10-30 MED ORDER — MIDAZOLAM HCL 2 MG/2ML IJ SOLN
INTRAMUSCULAR | Status: AC
  Filled 2014-10-30: qty 2

## 2014-10-30 MED ORDER — SODIUM CHLORIDE 0.9 % IV SOLN
INTRAVENOUS | Status: DC
  Administered 2014-10-30: 08:00:00 via INTRAVENOUS

## 2014-10-30 MED ORDER — SODIUM CHLORIDE 0.9 % IJ SOLN: 3.0000 mL | INTRAMUSCULAR | Status: DC | PRN

## 2014-10-30 MED ORDER — LIDOCAINE HCL (PF) 1 % IJ SOLN
INTRAMUSCULAR | Status: AC
  Filled 2014-10-30: qty 30

## 2014-10-30 MED ORDER — MIDAZOLAM HCL 2 MG/2ML IJ SOLN
INTRAMUSCULAR | Status: DC | PRN
  Administered 2014-10-30: 1 mg via INTRAVENOUS
  Administered 2014-10-30: 2 mg via INTRAVENOUS

## 2014-10-30 MED ORDER — FENTANYL CITRATE (PF) 100 MCG/2ML IJ SOLN
INTRAMUSCULAR | Status: DC | PRN
  Administered 2014-10-30 (×2): 25 ug via INTRAVENOUS

## 2014-10-30 MED ORDER — SODIUM CHLORIDE 0.9 % WEIGHT BASED INFUSION: 1.0000 mL/kg/h | INTRAVENOUS | Status: DC

## 2014-10-30 MED ORDER — HEPARIN (PORCINE) IN NACL 2-0.9 UNIT/ML-% IJ SOLN
INTRAMUSCULAR | Status: AC
  Filled 2014-10-30: qty 1000

## 2014-10-30 MED ORDER — FENTANYL CITRATE (PF) 100 MCG/2ML IJ SOLN
INTRAMUSCULAR | Status: AC
  Filled 2014-10-30: qty 2

## 2014-10-30 SURGICAL SUPPLY — 16 items
CATH INFINITI 5 FR JL3.5 (CATHETERS) IMPLANT
CATH INFINITI 5FR ANG PIGTAIL (CATHETERS) IMPLANT
CATH INFINITI 5FR MULTPACK ANG (CATHETERS) IMPLANT
CATH INFINITI JR4 5F (CATHETERS) IMPLANT
CATH SWAN GANZ 7F STRAIGHT (CATHETERS) ×2 IMPLANT
DEVICE RAD COMP TR BAND LRG (VASCULAR PRODUCTS) IMPLANT
GLIDESHEATH SLEND SS 6F .021 (SHEATH) IMPLANT
KIT HEART LEFT (KITS) ×2 IMPLANT
PACK CARDIAC CATHETERIZATION (CUSTOM PROCEDURE TRAY) ×2 IMPLANT
SHEATH PINNACLE 5F 10CM (SHEATH) IMPLANT
SHEATH PINNACLE 7F 10CM (SHEATH) ×2 IMPLANT
SYR MEDRAD MARK V 150ML (SYRINGE) IMPLANT
TRANSDUCER W/STOPCOCK (MISCELLANEOUS) ×2 IMPLANT
TUBING CIL FLEX 10 FLL-RA (TUBING) IMPLANT
WIRE EMERALD 3MM-J .035X150CM (WIRE) IMPLANT
WIRE SAFE-T 1.5MM-J .035X260CM (WIRE) IMPLANT

## 2014-10-30 NOTE — H&P (View-Only) (Signed)
Patient ID: EMALY BOSCHERT, female   DOB: December 31, 1970, 44 y.o.   MRN: [id number]    Patient Name: Mackenzie Patterson Date of Encounter: 10/22/2014  Primary Care Provider:  [name], MD Primary Cardiologist:  [name]  Problem List   Past Medical History  Diagnosis Date  . Obesity   . Heart murmur   . Congenital ventricular septal defect   . Asthma   . Diabetes mellitus without complication   . VSD (ventricular septal defect), perimembranous     Qp:Qs 1.7:1 by catheterization   . Pulmonary hypertension     mild   . Tricuspid regurgitation    Past Surgical History  Procedure Laterality Date  . Tubal ligation    . Cyst excision    . Left and right heart catheterization with coronary angiogram [name] 09/11/2013    Procedure: LEFT AND RIGHT HEART CATHETERIZATION WITH CORONARY ANGIOGRAM;  Surgeon: [name], MD;  Location: [facility] CATH LAB;  Service: Cardiovascular;  Laterality: [name];   Allergies  Allergies  Allergen Reactions  . Penicillins Anaphylaxis  . Tape Rash    Other reaction(s): Other (See Comments) Burns skin  . Other Rash    Other reaction(s): Other (See Comments) **EKG GEL PADS**  "Burns my skin" Pt states she is allergic to a blood pressure medication, unsure.  **EKG GEL PADS**  "Burns my skin"  . 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 The patient is not a good historian and is not sure if she was followed by cardiologists in the past and states that her primary care physician is her heart doctor. She states that 2 years ago she was referred to [facility] where she was offered an option of surgical closure of her VSD or medical management and she decided not to undergo surgery.  In June of 2014 she had a syncopal episode associated with chest pain and underwent a Lexiscan  nuclear stress test that showed  normal left ventricular ejection fraction fraction, no scar or ischemia. An echocardiogram at that time showed mildly dilated left ventricle with preserved ejection fraction, membranous VSD not further specified and severe pulmonary hypertension with preserved right ventricular size and function.  She came in March 2015 with palpitations that start after moderate activity and it can last up to 20-30 minutes. At the time she was also symptomatic with 2 pillow orthopnea, PND and progressively worsening dyspnea on exertion like walking to the mailbox that is about 50 feet from her house will make her short of breath.   She saw cardiothoracic surgeon Dr Roxy Manns who stated:  Impression:  Patient has a small-to-moderate sized perimembranous ventricular septal defect associated with progressive symptoms of chronic diastolic congestive heart failure, currently NYHA functional class III at least. She also experiences atypical chest pain, tachypalpitations, dizzy spells and occasional syncope. Catheterization is notable for the absence of coronary artery disease and only mild pulmonary hypertension. Echocardiogram is notable for the absence of any associated aortic insufficiency, normal left and right ventricular function, and at least moderate tricuspid regurgitation. Options include continued medical therapy versus elective surgical closure with or without possible tricuspid valve repair. Given her progressive symptoms and the development of mild pulmonary hypertension, I would favor proceeding with elective surgery.  Plan:  I discussed options at length with the patient in the office today. We discussed continued medical therapy, elective  surgery, or referral to a tertiary care center. I explained that we perform surgical repair on adults with congenital heart disease at [facility], but we do not have a pediatric congenital heart disease program. Although some centers approach closure of perimembranous ventricular septal  defects using minimally invasive surgical approach, I would favor a conventional sternotomy. The risks of surgery were discussed at length. All of her questions been addressed. The patient wants to discuss matters with her family and return in 2 or 3 weeks with her father present before making a final decision.  However, the patient after discussion with her father refused to undergo surgery.   10/22/2014 - the patient is coming up in 6 months, her symptoms continued to deteriorate. Her dyspnea on exertion is worsening now with minimal exertion like walking one flight of stairs. About 3 times a month she will have an episode of paroxysmal nocturnal dyspnea. She denies any recent syncope but continues having frequent heart palpitations without any associated dizziness or chest pain. She stopped using Lasix as she ran out of refills.  She presented today with worsening symptoms, there is worsening orthopnea, she has to sleep sitting up, PND, dyspnea on minimal exertion. Her most recent echocardiogram from 03/21/2014 shows progression pf pulmonary hypertension from mild to severe range.   Home Medications  Prior to Admission medications   Medication Sig Start Date End Date Taking? Authorizing Provider  Alogliptin-Metformin HCl (KAZANO) 12.10-998 MG TABS Take 1 tablet by mouth 2 (two) times daily.   Yes Historical Provider, MD  amLODipine (NORVASC) 10 MG tablet Take 5 mg by mouth every morning.    Yes Historical Provider, MD  nitroGLYCERIN (NITROSTAT) 0.4 MG SL tablet Place 0.4 mg under the tongue every 5 (five) minutes as needed for chest pain.   Yes Historical Provider, MD  ondansetron (ZOFRAN) 4 MG tablet Take 1 tablet (4 mg total) by mouth every 6 (six) hours. 06/24/13  Yes Delice Bison Ward, DO    Family History  Family History  Problem Relation Age of Onset  . Cancer Other   . Hyperlipidemia Other   . Hypertension Other   . Asthma Other     Social History  History   Social History  .  Marital Status: Single    Spouse Name: [name]  . Number of Children: [name]  . Years of Education: [name]   Occupational History  . Not on file.   Social History Main Topics  . Smoking status: Never Smoker   . Smokeless tobacco: Never Used  . Alcohol Use: No  . Drug Use: No  . Sexual Activity: Yes    Birth Control/ Protection: Pill   Other Topics Concern  . Not on file   Social History Narrative     Review of Systems, as per HPI, otherwise negative General:  No chills, fever, night sweats or weight changes.  Cardiovascular:  No chest pain, dyspnea on exertion, edema, orthopnea, palpitations, paroxysmal nocturnal dyspnea. Dermatological: No rash, lesions/masses Respiratory: No cough, dyspnea Urologic: No hematuria, dysuria Abdominal:   No nausea, vomiting, diarrhea, bright red blood per rectum, melena, or hematemesis Neurologic:  No visual changes, wkns, changes in mental status. All other systems reviewed and are otherwise negative except as noted above.  Physical Exam  Blood pressure 120/74, pulse 83, height 4\' 11"  (1.499 m), weight 163 lb (73.936 kg), SpO2 98 %.  General: Pleasant, NAD Psych: Normal affect. Neuro: Alert and oriented X 3. Moves all extremities spontaneously. HEENT: Normal  Neck: Supple  without bruits, JVD + 8 cm Lungs:  Resp regular and unlabored, CTA. Heart: RRR no s3, s4, harsh 5/6 holosystolic murmur at the LLSB. Abdomen: Soft, non-tender, non-distended, BS + x 4.  Extremities: No clubbing, cyanosis or edema. DP/PT/Radials 2+ and equal bilaterally.  Labs:  No results for input(s): CKTOTAL, CKMB, TROPONINI in the last 72 hours. Lab Results  Component Value Date   WBC 7.3 01/05/2014   HGB 13.7 01/05/2014   HCT 40.6 01/05/2014   MCV 86.6 01/05/2014   PLT 251 01/05/2014   No results for input(s): NA, K, CL, CO2, BUN, CREATININE, CALCIUM, PROT, BILITOT, ALKPHOS, ALT, AST, GLUCOSE in the last 168 hours.  Invalid input(s): LABALBU Lab Results  Component  Value Date   CHOL 149 12/08/2012   HDL 41 12/08/2012   LDLCALC 97 12/08/2012   TRIG 57 12/08/2012   Lab Results  Component Value Date   DDIMER <0.27 01/05/2014   Accessory Clinical Findings  Echocardiogram 08/23/2013  - Left ventricle: The cavity size was mildly dilated. Wall thickness was normal. Systolic function was normal. The estimated ejection fraction was in the range of 60% to 65%. - Left atrium: The atrium was mildly dilated. - Atrial septum: No defect or patent foramen ovale was identified. - Tricuspid valve: Degree of TR and estimation of PA pressure difficult due to contamination of TR jet from VSD Mild-moderate regurgitation. - Pulmonary arteries: PA peak pressure: 66mm Hg (S). QP/QS 2.2 - Pericardium, extracardiac: A trivial pericardial effusion was identified posterior to the heart. - Impressions: Some what large peri membranous VSD. Suggest QP/QS and right heart cath at some point. No RV/RA dilatation TR velocity contaminated by VSD flow so hard to estimate PA pressure. Elevated EDP by E/E; ratio Impressions:  - Some what large peri membranous VSD. Suggest QP/QS and right heart cath at some point. No RV/RA dilatation TR velocity contaminated by VSD flow so hard to estimate PA pressure. Elevated EDP by E/E; ratio  ECHO: 03/21/2014 Study Conclusions  - Left ventricle: The cavity size was normal. Systolic function was normal. The estimated ejection fraction was in the range of 50% to 55%. Wall motion was normal; there were no regional wall motion abnormalities. Doppler parameters are consistent with abnormal left ventricular relaxation (grade 1 diastolic dysfunction). - Ventricular septum: Ventricular septum: There is a restrictive membranous VSD - peak gradient across the defect is 153 mmHg. - Aortic valve: There was no regurgitation. - Aortic root: The aortic root was normal in size. - Left atrium: The atrium was normal in size. - Right ventricle:  Systolic function was normal. - Right atrium: The atrium was mildly dilated. - Tricuspid valve: There was moderate regurgitation. - Pulmonary arteries: Systolic pressure was severely increased. PA peak pressure: 74 mm Hg (S). - Pericardium, extracardiac: A trivial pericardial effusion was identified.  Impressions:  - Compared to the prior study on 5/28/ 2015 there is now moderate tricuspid regurgitation and severe pulmonary hypertension. RVSP 74 mmHg, previously 32 mmHg.   Lexiscan nuclear stress test 12/09/2012  Findings: QGS ejection fraction measures 62% , with an end- diastolic volume of 161 ml and an end-systolic volume of 41 ml.  IMPRESSION: No evidence for pharmacologically induced ischemia.  Normal wall motion and calculated ejection fraction is 62%.  ECG - 05/19/13 SR, LVH, early repolarization in the inferolateral leads  Cath 08/2013 Final Conclusions:  1. Normal coronary arteries  2. Ventricular septal defect with QP:QS of 1.7  3. Normal right heart pressures with the exception  of minimal pulmonary HTN (PVR less than 2 Woods units)  Sherren Mocha  09/11/2013, 9:28 AM   Assessment & Plan  A 44 year old female with   1. Congenital membranous ventricular septal defect. The patient has progressively worsening symptoms of tachypalpitations, DOE, CHF NYHA class III.  She now has worsening pulmonary hypertension from previously mild to severe, RVSP 74 mmHg on echocardiogram on 03/21/2014. Right sided cath performed in March 2015 showed PVR less than 2 Wood units consistent with minimal pulmonary hypertension. The patient states that she would now consider surgical treatment, we'll schedule another right-sided For evaluation of pulmonary hypertension reversibility. She will need to be referred to CT surgery again she would like to see a different surgeon than Dr Roxy Manns.  The condition and poor prognosis if left untreated was discussed in length with the patient.  The  patient states that she went to [facility] for second opinion and was told that at this point she doesn't need surgery yet, I'm not sure about the reliability of her report.   2. Palpitations - only sinus tachycardia and PACs on Holter monitor, we will ad metoprolol 25 mg po bid for symptoms improvement  3. Hypertension - controlled on current regimen  4. Syncope - possibly related to pulmonary hypertension  Followup in 6 months.   [name], MD, [facility] 10/22/2014, 2:57 PM

## 2014-10-30 NOTE — Interval H&P Note (Signed)
History and Physical Interval Note:  10/30/2014 8:59 AM  Mackenzie Patterson  has presented today for surgery, with the diagnosis of cp  The various methods of treatment have been discussed with the patient and family. After consideration of risks, benefits and other options for treatment, the patient has consented to  Procedure(s): Right Heart Cath ([name]) as a surgical intervention .  The patient's history has been reviewed, patient examined, no change in status, stable for surgery.  I have reviewed the patient's chart and labs.  Questions were answered to the patient's satisfaction.     Collier Salina [facility] 10/30/2014 8:59 AM

## 2014-10-30 NOTE — Discharge Instructions (Signed)

## 2014-10-31 LAB — POCT I-STAT 3, VENOUS BLOOD GAS (G3P V)
ACID-BASE DEFICIT: 4 mmol/L — AB (ref 0.0–2.0)
ACID-BASE DEFICIT: 5 mmol/L — AB (ref 0.0–2.0)
Acid-base deficit: 4 mmol/L — ABNORMAL HIGH (ref 0.0–2.0)
Acid-base deficit: 4 mmol/L — ABNORMAL HIGH (ref 0.0–2.0)
Acid-base deficit: 5 mmol/L — ABNORMAL HIGH (ref 0.0–2.0)
Acid-base deficit: 5 mmol/L — ABNORMAL HIGH (ref 0.0–2.0)
BICARBONATE: 20.7 meq/L (ref 20.0–24.0)
Bicarbonate: 20.6 mEq/L (ref 20.0–24.0)
Bicarbonate: 20.8 mEq/L (ref 20.0–24.0)
Bicarbonate: 21 mEq/L (ref 20.0–24.0)
Bicarbonate: 21 mEq/L (ref 20.0–24.0)
Bicarbonate: 21.9 mEq/L (ref 20.0–24.0)
O2 SAT: 75 %
O2 SAT: 79 %
O2 Saturation: 64 %
O2 Saturation: 72 %
O2 Saturation: 80 %
O2 Saturation: 83 %
PCO2 VEN: 37 mmHg — AB (ref 45.0–50.0)
PCO2 VEN: 39.1 mmHg — AB (ref 45.0–50.0)
PH VEN: 7.334 — AB (ref 7.250–7.300)
PH VEN: 7.341 — AB (ref 7.250–7.300)
PH VEN: 7.349 — AB (ref 7.250–7.300)
PH VEN: 7.363 — AB (ref 7.250–7.300)
PH VEN: 7.42 — AB (ref 7.250–7.300)
PO2 VEN: 36 mmHg (ref 30.0–45.0)
PO2 VEN: 42 mmHg (ref 30.0–45.0)
PO2 VEN: 49 mmHg — AB (ref 30.0–45.0)
Patient temperature: 31
TCO2: 22 mmol/L (ref 0–100)
TCO2: 22 mmol/L (ref 0–100)
TCO2: 22 mmol/L (ref 0–100)
TCO2: 22 mmol/L (ref 0–100)
TCO2: 22 mmol/L (ref 0–100)
TCO2: 23 mmol/L (ref 0–100)
pCO2, Ven: 29.8 mmHg — ABNORMAL LOW (ref 45.0–50.0)
pCO2, Ven: 37.8 mmHg — ABNORMAL LOW (ref 45.0–50.0)
pCO2, Ven: 38.3 mmHg — ABNORMAL LOW (ref 45.0–50.0)
pCO2, Ven: 41.1 mmHg — ABNORMAL LOW (ref 45.0–50.0)
pH, Ven: 7.337 — ABNORMAL HIGH (ref 7.250–7.300)
pO2, Ven: 26 mmHg — CL (ref 30.0–45.0)
pO2, Ven: 46 mmHg — ABNORMAL HIGH (ref 30.0–45.0)
pO2, Ven: 46 mmHg — ABNORMAL HIGH (ref 30.0–45.0)

## 2014-10-31 MED FILL — Lidocaine HCl Local Preservative Free (PF) Inj 1%: INTRAMUSCULAR | Qty: 30 | Status: AC

## 2014-10-31 MED FILL — Heparin Sodium (Porcine) 2 Unit/ML in Sodium Chloride 0.9%: INTRAMUSCULAR | Qty: 1000 | Status: AC

## 2014-11-06 ENCOUNTER — Ambulatory Visit
Admission: RE | Admit: 2014-11-06 | Discharge: 2014-11-06 | Disposition: A | Discharge status: Home / Self Care | Payer: Medicaid Other | Source: Ambulatory Visit | Attending: Cardiology | Admitting: Cardiology

## 2014-11-06 ENCOUNTER — Other Ambulatory Visit: Payer: Self-pay | Admitting: Cardiology

## 2014-11-06 DIAGNOSIS — N6452 Nipple discharge: Secondary | ICD-10-CM

## 2014-11-07 ENCOUNTER — Encounter: Payer: Medicaid Other | Admitting: Cardiology

## 2014-11-08 ENCOUNTER — Emergency Department (HOSPITAL_COMMUNITY)
Admission: EM | Admit: 2014-11-08 | Discharge: 2014-11-08 | Disposition: A | Discharge status: Home / Self Care | Payer: Medicaid Other | Attending: Emergency Medicine | Admitting: Emergency Medicine

## 2014-11-08 ENCOUNTER — Encounter (HOSPITAL_COMMUNITY): Payer: Self-pay | Admitting: Emergency Medicine

## 2014-11-08 DIAGNOSIS — Z880 Allergy status to penicillin: Secondary | ICD-10-CM | POA: Insufficient documentation

## 2014-11-08 DIAGNOSIS — Z91040 Latex allergy status: Secondary | ICD-10-CM | POA: Diagnosis not present

## 2014-11-08 DIAGNOSIS — J45909 Unspecified asthma, uncomplicated: Secondary | ICD-10-CM | POA: Diagnosis not present

## 2014-11-08 DIAGNOSIS — Z8774 Personal history of (corrected) congenital malformations of heart and circulatory system: Secondary | ICD-10-CM | POA: Diagnosis not present

## 2014-11-08 DIAGNOSIS — E669 Obesity, unspecified: Secondary | ICD-10-CM | POA: Diagnosis not present

## 2014-11-08 DIAGNOSIS — Z9889 Other specified postprocedural states: Secondary | ICD-10-CM | POA: Insufficient documentation

## 2014-11-08 DIAGNOSIS — E119 Type 2 diabetes mellitus without complications: Secondary | ICD-10-CM | POA: Diagnosis not present

## 2014-11-08 DIAGNOSIS — Z7982 Long term (current) use of aspirin: Secondary | ICD-10-CM | POA: Diagnosis not present

## 2014-11-08 DIAGNOSIS — M2662 Arthralgia of temporomandibular joint: Secondary | ICD-10-CM | POA: Insufficient documentation

## 2014-11-08 DIAGNOSIS — R6884 Jaw pain: Secondary | ICD-10-CM | POA: Diagnosis present

## 2014-11-08 DIAGNOSIS — Z79899 Other long term (current) drug therapy: Secondary | ICD-10-CM | POA: Insufficient documentation

## 2014-11-08 DIAGNOSIS — M26629 Arthralgia of temporomandibular joint, unspecified side: Secondary | ICD-10-CM

## 2014-11-08 DIAGNOSIS — E011 Iodine-deficiency related multinodular (endemic) goiter: Secondary | ICD-10-CM | POA: Insufficient documentation

## 2014-11-08 MED ORDER — IBUPROFEN 800 MG PO TABS: 800.0000 mg | ORAL_TABLET | Freq: Three times a day (TID) | ORAL | Status: DC

## 2014-11-08 MED ORDER — TRAMADOL HCL 50 MG PO TABS: 50.0000 mg | ORAL_TABLET | Freq: Four times a day (QID) | ORAL | Status: DC | PRN

## 2014-11-08 NOTE — ED Provider Notes (Signed)
CSN: [id number]     Arrival date & time 11/08/14  1011 History   First MD Initiated Contact with Patient 11/08/14 1014     Chief Complaint  Patient presents with  . Jaw Pain    R sided, "felt something pop", x1 day     (Consider location/radiation/quality/duration/timing/severity/associated sxs/prior Treatment) The history is provided by the patient. No language interpreter was used.    Past Medical History  Diagnosis Date  . Obesity   . Heart murmur   . Congenital ventricular septal defect   . Asthma   . Diabetes mellitus without complication   . VSD (ventricular septal defect), perimembranous     Qp:Qs 1.7:1 by catheterization   . Pulmonary hypertension     mild   . Tricuspid regurgitation    Past Surgical History  Procedure Laterality Date  . Tubal ligation    . Cyst excision    . Left and right heart catheterization with coronary angiogram [name] 09/11/2013    Procedure: LEFT AND RIGHT HEART CATHETERIZATION WITH CORONARY ANGIOGRAM;  Surgeon: [name], MD;  Location: [facility] CATH LAB;  Service: Cardiovascular;  Laterality: [name];  . Cardiac catheterization [name] 10/30/2014    Procedure: Right Heart Cath;  Surgeon: [name], MD;  Location: [facility] INVASIVE CV LAB CUPID;  Service: Cardiovascular;  Laterality: [name];   Family History  Problem Relation Age of Onset  . Cancer Other   . Hyperlipidemia Other   . Hypertension Other   . Asthma Other    History  Substance Use Topics  . Smoking status: Never Smoker   . Smokeless tobacco: Never Used  . Alcohol Use: No   OB History    No data available     Review of Systems  All other systems reviewed and are negative.     Allergies  Penicillins; Tape; Other; and Latex  Home Medications   Prior to Admission medications   Medication Sig Start Date End Date Taking? Authorizing Provider  ACCU-CHEK SOFTCLIX LANCETS lancets As directed 04/08/14   Historical Provider, MD  albuterol (PROVENTIL) (2.5 MG/3ML) 0.083% nebulizer  solution Take 2.5 mg by nebulization every 6 (six) hours as needed for wheezing or shortness of breath.    Historical Provider, MD  Alogliptin-Metformin HCl (KAZANO) 12.10-998 MG TABS Take 1 tablet by mouth 2 (two) times daily.     Historical Provider, MD  amLODipine (NORVASC) 5 MG tablet Take 1 tablet by mouth daily. 08/19/14   Historical Provider, MD  aspirin 81 MG chewable tablet Chew 81 mg by mouth daily.    Historical Provider, MD  glucose blood test strip As directed 02/17/14   Historical Provider, MD  ibuprofen (ADVIL,MOTRIN) 800 MG tablet Take 1 tablet (800 mg total) by mouth 3 (three) times daily. 11/08/14   Glendell Docker, NP  nitroGLYCERIN (NITROSTAT) 0.4 MG SL tablet Place 0.4 mg under the tongue every 5 (five) minutes as needed for chest pain.    Historical Provider, MD  traMADol (ULTRAM) 50 MG tablet Take 1 tablet (50 mg total) by mouth every 6 (six) hours as needed. 11/08/14   Glendell Docker, NP   BP 139/78 mmHg  Pulse 85  Temp(Src) 98.2 F (36.8 C) (Oral)  Resp 18  Ht 4\' 11"  (1.499 m)  Wt 161 lb (73.029 kg)  BMI 32.50 kg/m2  SpO2 99%  LMP 09/26/2014 Physical Exam  Constitutional: She appears well-developed and well-nourished.  HENT:  Right Ear: External ear normal.  Left Ear: External ear normal.  Tender  in the right tmj joint. Able to open and close mouth without any problem.   Cardiovascular:  Murmur heard. Pulmonary/Chest: Effort normal and breath sounds normal.  Neurological: She is alert.  Nursing note and vitals reviewed.   ED Course  Procedures (including critical care time) Labs Review Labs Reviewed - No data to display  Imaging Review    EKG Interpretation None      MDM   Final diagnoses:  TMJ arthralgia    Given ibuprofen and ultram for pain. Discussed symptomatic treatment for pain    Glendell Docker, NP 11/08/14 1051  Dorie Rank, MD 11/09/14 1539

## 2014-11-08 NOTE — Discharge Instructions (Signed)
Jaw °Range of Motion Exercises °Do the exercises below to prevent problems opening your mouth after a jaw fracture, TMJ, or surgery. °HOME CARE °Warm up °· Open your mouth as wide as possible for 15 seconds. °· Then, close your mouth and rest. Repeat this 8 times. °Exercises °· Stick your jaw forward for 15 seconds. Rest your jaw. Repeat this 8 times. °· Move your jaw right for 15 seconds. Rest your jaw and repeat 8 times. °· Move your jaw left for 15 seconds. Rest your jaw and repeat 8 times. °· Put your middle finger and thumb in your mouth. Push with your thumb on your top teeth and your middle finger on your bottom teeth opening your mouth. °· Stretch your mouth open as far as possible. Repeat 8 times. °· Chew gum when you are not doing exercises. °· Use moist heat packs 3 to 4 times a day on your jaw area. °Document Released: 05/26/2008 Document Revised: 09/05/2011 Document Reviewed: 05/26/2008 °[employer]® Patient Information ©2015 [employer]. This information is not intended to replace advice given to you by your health care provider. Make sure you discuss any questions you have with your health care provider. ° °

## 2014-11-12 ENCOUNTER — Encounter: Payer: Medicaid Other | Admitting: Surgery

## 2014-12-04 ENCOUNTER — Emergency Department (HOSPITAL_COMMUNITY)
Admission: EM | Admit: 2014-12-04 | Discharge: 2014-12-04 | Disposition: A | Discharge status: Home / Self Care | Payer: Medicaid Other | Attending: Emergency Medicine | Admitting: Emergency Medicine

## 2014-12-04 ENCOUNTER — Encounter (HOSPITAL_COMMUNITY): Payer: Self-pay

## 2014-12-04 ENCOUNTER — Emergency Department (HOSPITAL_COMMUNITY): Payer: Medicaid Other

## 2014-12-04 DIAGNOSIS — Z9889 Other specified postprocedural states: Secondary | ICD-10-CM | POA: Insufficient documentation

## 2014-12-04 DIAGNOSIS — Z79899 Other long term (current) drug therapy: Secondary | ICD-10-CM | POA: Diagnosis not present

## 2014-12-04 DIAGNOSIS — R011 Cardiac murmur, unspecified: Secondary | ICD-10-CM | POA: Insufficient documentation

## 2014-12-04 DIAGNOSIS — Q210 Ventricular septal defect: Secondary | ICD-10-CM | POA: Insufficient documentation

## 2014-12-04 DIAGNOSIS — Z7982 Long term (current) use of aspirin: Secondary | ICD-10-CM | POA: Insufficient documentation

## 2014-12-04 DIAGNOSIS — Z91040 Latex allergy status: Secondary | ICD-10-CM | POA: Insufficient documentation

## 2014-12-04 DIAGNOSIS — Z880 Allergy status to penicillin: Secondary | ICD-10-CM | POA: Insufficient documentation

## 2014-12-04 DIAGNOSIS — J45909 Unspecified asthma, uncomplicated: Secondary | ICD-10-CM | POA: Insufficient documentation

## 2014-12-04 DIAGNOSIS — M7989 Other specified soft tissue disorders: Secondary | ICD-10-CM | POA: Diagnosis present

## 2014-12-04 DIAGNOSIS — E119 Type 2 diabetes mellitus without complications: Secondary | ICD-10-CM | POA: Diagnosis not present

## 2014-12-04 DIAGNOSIS — E669 Obesity, unspecified: Secondary | ICD-10-CM | POA: Diagnosis not present

## 2014-12-04 DIAGNOSIS — I10 Essential (primary) hypertension: Secondary | ICD-10-CM | POA: Diagnosis not present

## 2014-12-04 DIAGNOSIS — M79644 Pain in right finger(s): Secondary | ICD-10-CM | POA: Diagnosis not present

## 2014-12-04 MED ORDER — KETOROLAC TROMETHAMINE 60 MG/2ML IM SOLN
60.0000 mg | Freq: Once | INTRAMUSCULAR | Status: AC
  Administered 2014-12-04: 60 mg via INTRAMUSCULAR
  Filled 2014-12-04: qty 2

## 2014-12-04 MED ORDER — NAPROXEN SODIUM 550 MG PO TABS: 550.0000 mg | ORAL_TABLET | Freq: Two times a day (BID) | ORAL | Status: DC

## 2014-12-04 NOTE — Discharge Instructions (Signed)
You were evaluated in the ED today for your thumb pain. There is not appear to be an emergent cause for your symptoms at this time. Please take your anti-inflammatory's as directed. Please follow-up with her primary care for further evaluation management of your symptoms. Return to ED for new or worsening symptoms.  Musculoskeletal Pain Musculoskeletal pain is muscle and boney aches and pains. These pains can occur in any part of the body. Your caregiver may treat you without knowing the cause of the pain. They may treat you if blood or urine tests, X-rays, and other tests were normal.  CAUSES There is often not a definite cause or reason for these pains. These pains may be caused by a type of germ (virus). The discomfort may also come from overuse. Overuse includes working out too hard when your body is not fit. Boney aches also come from weather changes. Bone is sensitive to atmospheric pressure changes. HOME CARE INSTRUCTIONS   Ask when your test results will be ready. Make sure you get your test results.  Only take over-the-counter or prescription medicines for pain, discomfort, or fever as directed by your caregiver. If you were given medications for your condition, do not drive, operate machinery or power tools, or sign legal documents for 24 hours. Do not drink alcohol. Do not take sleeping pills or other medications that may interfere with treatment.  Continue all activities unless the activities cause more pain. When the pain lessens, slowly resume normal activities. Gradually increase the intensity and duration of the activities or exercise.  During periods of severe pain, bed rest may be helpful. Lay or sit in any position that is comfortable.  Putting ice on the injured area.  Put ice in a bag.  Place a towel between your skin and the bag.  Leave the ice on for 15 to 20 minutes, 3 to 4 times a day.  Follow up with your caregiver for continued problems and no reason can be found  for the pain. If the pain becomes worse or does not go away, it may be necessary to repeat tests or do additional testing. Your caregiver may need to look further for a possible cause. SEEK IMMEDIATE MEDICAL CARE IF:  You have pain that is getting worse and is not relieved by medications.  You develop chest pain that is associated with shortness or breath, sweating, feeling sick to your stomach (nauseous), or throw up (vomit).  Your pain becomes localized to the abdomen.  You develop any new symptoms that seem different or that concern you. MAKE SURE YOU:   Understand these instructions.  Will watch your condition.  Will get help right away if you are not doing well or get worse. Document Released: 06/13/2005 Document Revised: 09/05/2011 Document Reviewed: 02/15/2013 [facility] Patient Information 2015 [place], [place]. This information is not intended to replace advice given to you by your health care provider. Make sure you discuss any questions you have with your health care provider.

## 2014-12-04 NOTE — ED Notes (Signed)
Patient states she began having right hand pain last night and swelling this AM. Patient states pain increases with movement. Patient rates pain 10/10. Patient states she took Ibuprofen 800 mg at 2100 last night and iced it, but no relief. Hand is painful to touch.

## 2014-12-04 NOTE — ED Notes (Signed)
Patient transported to X-ray 

## 2014-12-04 NOTE — ED Provider Notes (Signed)
CSN: [id number]     Arrival date & time 12/04/14  0730 History   First MD Initiated Contact with Patient 12/04/14 [phone]     Chief Complaint  Patient presents with  . hand swelling      (Consider location/radiation/quality/duration/timing/severity/associated sxs/prior Treatment) HPI Mackenzie Patterson is a 44 y.o. female with history of congenital VSD, and heart cath 5/16, comes in for evaluation of right hand pain. 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 Denies any injury. Localizes the discomfort to her right thumb. Describes the pain as a "sharp throbbing". States the discomfort as a 10/10 with movement. She has tried 800 mg ibuprofen and icing without relief. Movement exacerbates pain and nothing relieves discomfort. Denies fevers, chills, numbness or weakness, chest pain, shortness of breath, history of gout. No other aggravating or modifying factors.  Past Medical History  Diagnosis Date  . Obesity   . Heart murmur   . Congenital ventricular septal defect   . Asthma   . Diabetes mellitus without complication   . VSD (ventricular septal defect), perimembranous     Qp:Qs 1.7:1 by catheterization   . Pulmonary hypertension     mild   . Tricuspid regurgitation    Past Surgical History  Procedure Laterality Date  . Tubal ligation    . Cyst excision    . Left and right heart catheterization with coronary angiogram [name] 09/11/2013    Procedure: LEFT AND RIGHT HEART CATHETERIZATION WITH CORONARY ANGIOGRAM;  Surgeon: [name], MD;  Location: [facility] CATH LAB;  Service: Cardiovascular;  Laterality: [name];  . Cardiac catheterization [name] 10/30/2014    Procedure: Right Heart Cath;  Surgeon: [name], MD;  Location: [facility] INVASIVE CV LAB CUPID;  Service: Cardiovascular;  Laterality: [name];   Family History  Problem Relation Age of Onset  . Cancer Other   . Hyperlipidemia Other   . Hypertension Other   . Asthma Other    History  Substance Use Topics  . Smoking status:  Never Smoker   . Smokeless tobacco: Never Used  . Alcohol Use: No   OB History    No data available     Review of Systems A 10 point review of systems was completed and was negative except for pertinent positives and negatives as mentioned in the history of present illness     Allergies  Penicillins; Tape; Other; and Latex  Home Medications   Prior to Admission medications   Medication Sig Start Date End Date Taking? Authorizing Provider  ACCU-CHEK SOFTCLIX LANCETS lancets As directed 04/08/14  Yes Historical Provider, MD  albuterol (PROVENTIL) (2.5 MG/3ML) 0.083% nebulizer solution Take 2.5 mg by nebulization every 6 (six) hours as needed for wheezing or shortness of breath.   Yes Historical Provider, MD  Alogliptin-Metformin HCl (KAZANO) 12.10-998 MG TABS Take 1 tablet by mouth daily.    Yes Historical Provider, MD  amLODipine (NORVASC) 5 MG tablet Take 1 tablet by mouth daily. 08/19/14  Yes Historical Provider, MD  aspirin 81 MG chewable tablet Chew 81 mg by mouth daily.   Yes Historical Provider, MD  glucose blood test strip As directed 02/17/14  Yes Historical Provider, MD  ibuprofen (ADVIL,MOTRIN) 800 MG tablet Take 1 tablet (800 mg total) by mouth 3 (three) times daily. Patient not taking: Reported on 12/04/2014 11/08/14   Glendell Docker, NP  naproxen sodium (ANAPROX) 550 MG tablet Take 1 tablet (550 mg total) by mouth 2 (two) times daily with a meal. 12/04/14   Marland Kitchen  Sophie Tamez, PA-C  traMADol (ULTRAM) 50 MG tablet Take 1 tablet (50 mg total) by mouth every 6 (six) hours as needed. Patient not taking: Reported on 12/04/2014 11/08/14   Glendell Docker, NP   BP 149/64 mmHg  Pulse 68  Temp(Src) 98.1 F (36.7 C) (Oral)  Resp 20  SpO2 97%  LMP 12/04/2014 Physical Exam  Constitutional: She is oriented to person, place, and time. She appears well-developed and well-nourished.  HENT:  Head: Normocephalic and atraumatic.  Mouth/Throat: Oropharynx is clear and moist.  Eyes:  Conjunctivae are normal. Pupils are equal, round, and reactive to light. Right eye exhibits no discharge. Left eye exhibits no discharge. No scleral icterus.  Neck: Neck supple.  Cardiovascular: Normal rate, regular rhythm and normal heart sounds.   Pulmonary/Chest: Effort normal and breath sounds normal. No respiratory distress. She has no wheezes. She has no rales.  Abdominal: Soft. There is no tenderness.  Musculoskeletal: She exhibits no tenderness.  Tenderness to palpation diffusely around the right thumb MCP. Very mild diffuse swelling compared to left hand. Range of motion is limited secondary to pain. No tenderness at the fingertips. No evidence of distal finger infection. Distal pulses intact. Neurovascularly intact.  Neurological: She is alert and oriented to person, place, and time.  Cranial Nerves II-XII grossly intact  Skin: Skin is warm and dry. No rash noted.  Psychiatric: She has a normal mood and affect.  Nursing note and vitals reviewed.   ED Course  Procedures (including critical care time) Labs Review Labs Reviewed - No data to display  Imaging Review Dg Hand Complete Left  12/04/2014   CLINICAL DATA:  Right hand pain.  No known injury.  EXAM: LEFT HAND - COMPLETE 3+ VIEW  COMPARISON:  None.  FINDINGS: There is no evidence of fracture or dislocation. There is no evidence of arthropathy or other focal bone abnormality. Soft tissues are unremarkable.  IMPRESSION: Normal exam.   Electronically Signed   By: Lorriane Shire M.D.   On: 12/04/2014 08:07     EKG Interpretation None     Meds given in ED:  Medications  ketorolac (TORADOL) injection 60 mg (60 mg Intramuscular Given 12/04/14 0840)    New Prescriptions   NAPROXEN SODIUM (ANAPROX) 550 MG TABLET    Take 1 tablet (550 mg total) by mouth 2 (two) times daily with a meal.   Filed Vitals:   12/04/14 0733  BP: 149/64  Pulse: 68  Temp: 98.1 F (36.7 C)  TempSrc: Oral  Resp: 20  SpO2: 97%    MDM  Vitals  stable - WNL -afebrile Pt resting comfortably in ED. PE--no evidence of infection, other paronychia or felon, gout. Neurovascularly intact Doubt septic joint, hemarthrosis. Imaging--x-ray of right hand negative  Given Toradol IM in the ED, will discharge with naproxen for discomfort at home. No evidence of other acute or emergent pathology at this time. I discussed all relevant lab findings and imaging results with pt and they verbalized understanding. Discussed f/u with PCP within 48 hrs and return precautions, pt very amenable to plan. Prior to patient discharge, I discussed and reviewed this case with Dr.Rees    Final diagnoses:  Thumb pain, right       Comer Locket, PA-C 12/04/14 0848  Quintella Reichert, MD 12/04/14 [phone]

## 2014-12-08 ENCOUNTER — Other Ambulatory Visit: Payer: Self-pay | Admitting: Cardiology

## 2014-12-08 DIAGNOSIS — R1084 Generalized abdominal pain: Secondary | ICD-10-CM

## 2014-12-08 DIAGNOSIS — R1013 Epigastric pain: Secondary | ICD-10-CM

## 2014-12-08 DIAGNOSIS — R110 Nausea: Secondary | ICD-10-CM

## 2014-12-08 DIAGNOSIS — R197 Diarrhea, unspecified: Secondary | ICD-10-CM

## 2014-12-12 ENCOUNTER — Other Ambulatory Visit: Payer: Medicaid Other

## 2014-12-21 ENCOUNTER — Emergency Department (HOSPITAL_COMMUNITY)
Admission: EM | Admit: 2014-12-21 | Discharge: 2014-12-21 | Disposition: A | Discharge status: Home / Self Care | Payer: Medicaid Other | Attending: Emergency Medicine | Admitting: Emergency Medicine

## 2014-12-21 ENCOUNTER — Encounter (HOSPITAL_COMMUNITY): Payer: Self-pay

## 2014-12-21 ENCOUNTER — Emergency Department (HOSPITAL_COMMUNITY): Payer: Medicaid Other

## 2014-12-21 DIAGNOSIS — R011 Cardiac murmur, unspecified: Secondary | ICD-10-CM | POA: Diagnosis not present

## 2014-12-21 DIAGNOSIS — R002 Palpitations: Secondary | ICD-10-CM | POA: Insufficient documentation

## 2014-12-21 DIAGNOSIS — E119 Type 2 diabetes mellitus without complications: Secondary | ICD-10-CM | POA: Diagnosis not present

## 2014-12-21 DIAGNOSIS — I10 Essential (primary) hypertension: Secondary | ICD-10-CM | POA: Insufficient documentation

## 2014-12-21 DIAGNOSIS — Z9889 Other specified postprocedural states: Secondary | ICD-10-CM | POA: Diagnosis not present

## 2014-12-21 DIAGNOSIS — Z880 Allergy status to penicillin: Secondary | ICD-10-CM | POA: Insufficient documentation

## 2014-12-21 DIAGNOSIS — R079 Chest pain, unspecified: Secondary | ICD-10-CM | POA: Diagnosis not present

## 2014-12-21 DIAGNOSIS — Z79899 Other long term (current) drug therapy: Secondary | ICD-10-CM | POA: Insufficient documentation

## 2014-12-21 DIAGNOSIS — Z7982 Long term (current) use of aspirin: Secondary | ICD-10-CM | POA: Insufficient documentation

## 2014-12-21 DIAGNOSIS — H538 Other visual disturbances: Secondary | ICD-10-CM | POA: Diagnosis not present

## 2014-12-21 DIAGNOSIS — Z91040 Latex allergy status: Secondary | ICD-10-CM | POA: Diagnosis not present

## 2014-12-21 DIAGNOSIS — J45909 Unspecified asthma, uncomplicated: Secondary | ICD-10-CM | POA: Diagnosis not present

## 2014-12-21 DIAGNOSIS — E669 Obesity, unspecified: Secondary | ICD-10-CM | POA: Diagnosis not present

## 2014-12-21 DIAGNOSIS — R42 Dizziness and giddiness: Secondary | ICD-10-CM

## 2014-12-21 DIAGNOSIS — Q210 Ventricular septal defect: Secondary | ICD-10-CM | POA: Insufficient documentation

## 2014-12-21 DIAGNOSIS — Z791 Long term (current) use of non-steroidal anti-inflammatories (NSAID): Secondary | ICD-10-CM | POA: Diagnosis not present

## 2014-12-21 LAB — BASIC METABOLIC PANEL WITH GFR
Anion gap: 7 (ref 5–15)
BUN: 10 mg/dL (ref 6–20)
CO2: 25 mmol/L (ref 22–32)
Calcium: 9 mg/dL (ref 8.9–10.3)
Chloride: 103 mmol/L (ref 101–111)
Creatinine, Ser: 0.79 mg/dL (ref 0.44–1.00)
GFR calc Af Amer: >60 mL/min
GFR calc non Af Amer: >60 mL/min
Glucose, Bld: 149 mg/dL — ABNORMAL HIGH (ref 65–99)
Potassium: 4.1 mmol/L (ref 3.5–5.1)
Sodium: 135 mmol/L (ref 135–145)

## 2014-12-21 LAB — CBC WITH DIFFERENTIAL/PLATELET
Basophils Absolute: 0 10*3/uL (ref 0.0–0.1)
Basophils Relative: 0 % (ref 0–1)
Eosinophils Absolute: 0.1 10*3/uL (ref 0.0–0.7)
Eosinophils Relative: 2 % (ref 0–5)
HCT: 35 % — ABNORMAL LOW (ref 36.0–46.0)
Hemoglobin: 11.1 g/dL — ABNORMAL LOW (ref 12.0–15.0)
Lymphocytes Relative: 27 % (ref 12–46)
Lymphs Abs: 1.3 10*3/uL (ref 0.7–4.0)
MCH: 24.8 pg — ABNORMAL LOW (ref 26.0–34.0)
MCHC: 31.7 g/dL (ref 30.0–36.0)
MCV: 78.3 fL (ref 78.0–100.0)
Monocytes Absolute: 0.5 10*3/uL (ref 0.1–1.0)
Monocytes Relative: 10 % (ref 3–12)
Neutro Abs: 3 10*3/uL (ref 1.7–7.7)
Neutrophils Relative %: 61 % (ref 43–77)
Platelets: 241 10*3/uL (ref 150–400)
RBC: 4.47 MIL/uL (ref 3.87–5.11)
RDW: 14.9 % (ref 11.5–15.5)
WBC: 4.9 10*3/uL (ref 4.0–10.5)

## 2014-12-21 LAB — I-STAT TROPONIN, ED: Troponin i, poc: 0 ng/mL (ref 0.00–0.08)

## 2014-12-21 MED ORDER — SODIUM CHLORIDE 0.9 % IV BOLUS (SEPSIS): 1000.0000 mL | Freq: Once | INTRAVENOUS | Status: DC

## 2014-12-21 NOTE — ED Provider Notes (Signed)
CSN: [id number]     Arrival date & time 12/21/14  0809 History   First MD Initiated Contact with Patient 12/21/14 [phone]     Chief Complaint  Patient presents with  . Hypertension  . Dizziness   Mackenzie Patterson is a 44 y.o. female with a history of VSD, DM, and pulmonary hypertension who presents to the ED complaining of positional lightheadedness associated with spots in her vision for the past 6 days. The patient reports she had a cardiac cath on 12/15/14 at [facility] for her VSD to prepare for possible surgery. She reports that since then she has had positional lightheadedness and she sees spots and feeling lightheaded while standing. She denies any spots in her vision currently. Patient also reports 5 out of 10 substernal chest pain that is nonradiating that has been intermittent for the past several years and has been constant for the past 6 days. She also reports some slight shortness of breath that is not exertional for the past 6 days. She also reports feeling of her heart is fluttering since her heart cath 6 days ago. She reports she restarted her metoprolol for her palpitations 4 days ago. Her cardiologist at [facility] is Dr. Leonides Schanz. She reports she called and told her cardiologist about her symptoms who advised her to wait and see how she felt in a few days and if she was not better to follow up with her in the office next week or go to the ED. The patient denies fevers, chills, leg pain, leg swelling, cough, wheezing, LOC, new abdominal pain, nausea, vomiting, diarrhea, numbness, tingling, weakness, or rashes.  (Consider location/radiation/quality/duration/timing/severity/associated sxs/prior Treatment) HPI  Past Medical History  Diagnosis Date  . Obesity   . Heart murmur   . Congenital ventricular septal defect   . Asthma   . Diabetes mellitus without complication   . VSD (ventricular septal defect), perimembranous     Qp:Qs 1.7:1 by catheterization   . Pulmonary hypertension     mild   . Tricuspid  regurgitation    Past Surgical History  Procedure Laterality Date  . Tubal ligation    . Cyst excision    . Left and right heart catheterization with coronary angiogram [name] 09/11/2013    Procedure: LEFT AND RIGHT HEART CATHETERIZATION WITH CORONARY ANGIOGRAM;  Surgeon: [name], MD;  Location: [facility] CATH LAB;  Service: Cardiovascular;  Laterality: [name];  . Cardiac catheterization [name] 10/30/2014    Procedure: Right Heart Cath;  Surgeon: [name], MD;  Location: [facility] INVASIVE CV LAB CUPID;  Service: Cardiovascular;  Laterality: [name];   Family History  Problem Relation Age of Onset  . Cancer Other   . Hyperlipidemia Other   . Hypertension Other   . Asthma Other    History  Substance Use Topics  . Smoking status: Never Smoker   . Smokeless tobacco: Never Used  . Alcohol Use: No   OB History    No data available     Review of Systems  Constitutional: Negative for fever and chills.  HENT: Negative for congestion and sore throat.   Eyes: Positive for visual disturbance. Negative for pain.  Respiratory: Negative for cough and wheezing.   Cardiovascular: Positive for chest pain and palpitations. Negative for leg swelling.  Gastrointestinal: Negative for nausea, vomiting, abdominal pain and diarrhea.  Genitourinary: Negative for dysuria and hematuria.  Musculoskeletal: Negative for back pain and neck pain.  Skin: Negative for rash.  Neurological: Positive for light-headedness. Negative for dizziness, syncope,  weakness, numbness and headaches.      Allergies  Penicillins; Tape; Other; and Latex  Home Medications   Prior to Admission medications   Medication Sig Start Date End Date Taking? Authorizing Provider  acyclovir (ZOVIRAX) 400 MG tablet Take 400 mg by mouth 2 (two) times daily.   Yes Historical Provider, MD  albuterol (PROVENTIL) (2.5 MG/3ML) 0.083% nebulizer solution Take 2.5 mg by nebulization every 6 (six) hours as needed for wheezing or shortness of breath.    Yes Historical Provider, MD  Alogliptin Benzoate (NESINA) 25 MG TABS Take 25 mg by mouth every morning.   Yes Historical Provider, MD  amLODipine (NORVASC) 5 MG tablet Take 1 tablet by mouth daily. 08/19/14  Yes Historical Provider, MD  aspirin 81 MG chewable tablet Chew 81 mg by mouth daily.   Yes Historical Provider, MD  metFORMIN (GLUCOPHAGE) 1000 MG tablet Take 1,000 mg by mouth daily with breakfast.   Yes Historical Provider, MD  naproxen sodium (ANAPROX) 550 MG tablet Take 1 tablet (550 mg total) by mouth 2 (two) times daily with a meal. 12/04/14  Yes Comer Locket, PA-C  traMADol (ULTRAM) 50 MG tablet Take 1 tablet (50 mg total) by mouth every 6 (six) hours as needed. Patient taking differently: Take 50 mg by mouth every 6 (six) hours as needed. For pain 11/08/14  Yes Glendell Docker, NP  ibuprofen (ADVIL,MOTRIN) 800 MG tablet Take 1 tablet (800 mg total) by mouth 3 (three) times daily. Patient not taking: Reported on 12/04/2014 11/08/14   Glendell Docker, NP   BP 139/88 mmHg  Pulse 83  Temp(Src) 98.4 F (36.9 C) (Oral)  Resp 29  Ht 4\' 11"  (1.499 m)  Wt 161 lb (73.029 kg)  BMI 32.50 kg/m2  SpO2 100%  LMP 12/04/2014 Physical Exam  Constitutional: She is oriented to person, place, and time. She appears well-developed and well-nourished. No distress.  Nontoxic appearing.  HENT:  Head: Normocephalic and atraumatic.  Right Ear: External ear normal.  Left Ear: External ear normal.  Mouth/Throat: Oropharynx is clear and moist. No oropharyngeal exudate.  Eyes: Conjunctivae are normal. Pupils are equal, round, and reactive to light. Right eye exhibits no discharge. Left eye exhibits no discharge.  Neck: Normal range of motion. Neck supple. No JVD present. No tracheal deviation present.  Cardiovascular: Normal rate, regular rhythm and intact distal pulses.  Exam reveals no gallop and no friction rub.   Murmur heard. Harsh holosystolic murmur. Heart is regular rate and rhythm. Bilateral  radial, posterior tibialis and dorsalis pedis pulses are intact.    Pulmonary/Chest: Effort normal and breath sounds normal. No respiratory distress. She has no wheezes. She has no rales. She exhibits no tenderness.  Lungs are clear to auscultation bilaterally. No chest wall tenderness.  Abdominal: Soft. Bowel sounds are normal. She exhibits no distension. There is no tenderness. There is no guarding.  Patient's cath site to her right inguinal area is well-healed and mildly tender to palpation. No masses or erythema noted.   Musculoskeletal: She exhibits no edema or tenderness.  No lower extremity edema or tenderness. She is able to ambulate without difficulty or assistance.   Lymphadenopathy:    She has no cervical adenopathy.  Neurological: She is alert and oriented to person, place, and time. Coordination normal.  Skin: Skin is warm and dry. No rash noted. She is not diaphoretic. No erythema. No pallor.  Psychiatric: She has a normal mood and affect. Her behavior is normal.  Nursing note and vitals reviewed.  ED Course  Procedures (including critical care time) Labs Review Labs Reviewed  BASIC METABOLIC PANEL - Abnormal; Notable for the following:    Glucose, Bld 149 (*)    All other components within normal limits  CBC WITH DIFFERENTIAL/PLATELET - Abnormal; Notable for the following:    Hemoglobin 11.1 (*)    HCT 35.0 (*)    MCH 24.8 (*)    All other components within normal limits  Randolm Idol, ED    Imaging Review Dg Chest 2 View  12/21/2014   CLINICAL DATA:  Chest pain for 5 days  EXAM: CHEST  2 VIEW  COMPARISON:  06/30/2014  FINDINGS: There is mild cardiac enlargement. No pleural effusion or edema. No airspace consolidation.  IMPRESSION: 1. No acute cardiopulmonary abnormalities. 2. Cardiac enlargement.   Electronically Signed   By: Kerby Moors M.D.   On: 12/21/2014 09:32     EKG Interpretation   Date/Time:  Sunday December 21 2014 08:21:52 EDT Ventricular Rate:   77 PR Interval:  174 QRS Duration: 80 QT Interval:  402 QTC Calculation: 455 R Axis:   85 Text Interpretation:  Sinus rhythm No significant change since last  tracing Confirmed by KOHUT  MD, STEPHEN ([id number]) on 12/21/2014 8:56:41 AM      Filed Vitals:   12/21/14 0904 12/21/14 0905 12/21/14 0930 12/21/14 1000  BP: 112/77 115/67 134/85 139/88  Pulse: 78 88 83   Temp:      TempSrc:      Resp:   26 29  Height:      Weight:      SpO2:   100%      MDM   Meds given in ED:  Medications  sodium chloride 0.9 % bolus 1,000 mL (1,000 mLs Intravenous Not Given 12/21/14 0901)    New Prescriptions   No medications on file    Final diagnoses:  Intermittent lightheadedness  Chest pain, unspecified chest pain type   This is a 44 y.o. female with a history of VSD, DM, and pulmonary hypertension who presents to the ED complaining of positional lightheadedness associated with spots in her vision for the past 6 days. The patient reports she had a cardiac cath on 12/15/14 at [facility] for her VSD to prepare for possible surgery. She reports that since then she has had positional lightheadedness and she sees spots and feeling lightheaded while standing. She denies any spots in her vision currently. Patient also reports 5 out of 10 substernal chest pain that is nonradiating that has been intermittent for the past several years and has been constant for the past 6 days. She reports that she told her cardiologist about her symptoms who advised her to wait out the week and see how she felt and to follow-up with her the beginning of this coming week or go to the ED. On exam patient is afebrile and nontoxic appearing. She has a harsh holosystolic murmur that is consistent with her VSD found on a cardiac catheterizations. The patient is not tachypneic, tachycardic or hypoxic. Well's criteria negative. She reports he only sees spots intermittently when she feels lightheaded. She denies current changes to vision or eye  pain. Patient's troponin is negative. BMP is unremarkable. CBC shows a hemoglobin of 11.1 with hematocrit of 35. This is stable from her most recent blood work. Chest x-ray shows no acute cardiopulmonary abnormalities. Patient is offered fluid bolus which she refused. Instead patient tolerated ginger ale and graham crackers and reported feeling better. Patient upon standing  reported feeling slightly lightheaded but denied changes to her vision. She walked around the room without difficulty. She reports feeling comfortable for discharge and reports feeling better. Will discharge with close follow up with her cardiologist this week. Strict return precautions provided.   This patient was discussed with Dr. Wilson Singer who agrees with assessment and plan.    Waynetta Pean, PA-C 12/21/14 [place], MD 12/22/14 [phone]

## 2014-12-21 NOTE — Discharge Instructions (Signed)
°Chest Pain (Nonspecific) °It is often hard to give a specific diagnosis for the cause of chest pain. There is always a chance that your pain could be related to something serious, such as a heart attack or a blood clot in the lungs. You need to follow up with your health care provider for further evaluation. °CAUSES  °· Heartburn. °· Pneumonia or bronchitis. °· Anxiety or stress. °· Inflammation around your heart (pericarditis) or lung (pleuritis or pleurisy). °· A blood clot in the lung. °· A collapsed lung (pneumothorax). It can develop suddenly on its own (spontaneous pneumothorax) or from trauma to the chest. °· Shingles infection (herpes zoster virus). °The chest wall is composed of bones, muscles, and cartilage. Any of these can be the source of the pain. °· The bones can be bruised by injury. °· The muscles or cartilage can be strained by coughing or overwork. °· The cartilage can be affected by inflammation and become sore (costochondritis). °DIAGNOSIS  °Lab tests or other studies may be needed to find the cause of your pain. Your health care provider may have you take a test called an ambulatory electrocardiogram (ECG). An ECG records your heartbeat patterns over a 24-hour period. You may also have other tests, such as: °· Transthoracic echocardiogram (TTE). During echocardiography, sound waves are used to evaluate how blood flows through your heart. °· Transesophageal echocardiogram (TEE). °· Cardiac monitoring. This allows your health care provider to monitor your heart rate and rhythm in real time. °· Holter monitor. This is a portable device that records your heartbeat and can help diagnose heart arrhythmias. It allows your health care provider to track your heart activity for several days, if needed. °· Stress tests by exercise or by giving medicine that makes the heart beat faster. °TREATMENT  °· Treatment depends on what may be causing your chest pain. Treatment may include: °· Acid blockers for  heartburn. °· Anti-inflammatory medicine. °· Pain medicine for inflammatory conditions. °· Antibiotics if an infection is present. °· You may be advised to change lifestyle habits. This includes stopping smoking and avoiding alcohol, caffeine, and chocolate. °· You may be advised to keep your head raised (elevated) when sleeping. This reduces the chance of acid going backward from your stomach into your esophagus. °Most of the time, nonspecific chest pain will improve within 2-3 days with rest and mild pain medicine.  °HOME CARE INSTRUCTIONS  °· If antibiotics were prescribed, take them as directed. Finish them even if you start to feel better. °· For the next few days, avoid physical activities that bring on chest pain. Continue physical activities as directed. °· Do not use any tobacco products, including cigarettes, chewing tobacco, or electronic cigarettes. °· Avoid drinking alcohol. °· Only take medicine as directed by your health care provider. °· Follow your health care provider's suggestions for further testing if your chest pain does not go away. °· Keep any follow-up appointments you made. If you do not go to an appointment, you could develop lasting (chronic) problems with pain. If there is any problem keeping an appointment, call to reschedule. °SEEK MEDICAL CARE IF:  °· Your chest pain does not go away, even after treatment. °· You have a rash with blisters on your chest. °· You have a fever. °SEEK IMMEDIATE MEDICAL CARE IF:  °· You have increased chest pain or pain that spreads to your arm, neck, jaw, back, or abdomen. °· You have shortness of breath. °· You have an increasing cough, or you cough   up blood. °· You have severe back or abdominal pain. °· You feel nauseous or vomit. °· You have severe weakness. °· You faint. °· You have chills. °This is an emergency. Do not wait to see if the pain will go away. Get medical help at once. Call your local emergency services (911 in [place] Do not drive  yourself to the hospital. °MAKE SURE YOU:  °· Understand these instructions. °· Will watch your condition. °· Will get help right away if you are not doing well or get worse. °Document Released: 03/23/2005 Document Revised: 06/18/2013 Document Reviewed: 01/17/2008 °ExitCare® Patient Information ©2015 [employer]. This information is not intended to replace advice given to you by your health care provider. Make sure you discuss any questions you have with your health care provider. ° ° °Dizziness °Dizziness is a common problem. It is a feeling of unsteadiness or light-headedness. You may feel like you are about to faint. Dizziness can lead to injury if you stumble or fall. A person of any age group can suffer from dizziness, but dizziness is more common in older adults. °CAUSES  °Dizziness can be caused by many different things, including: °· Middle ear problems. °· Standing for too long. °· Infections. °· An allergic reaction. °· Aging. °· An emotional response to something, such as the sight of blood. °· Side effects of medicines. °· Tiredness. °· Problems with circulation or blood pressure. °· Excessive use of alcohol or medicines, or illegal drug use. °· Breathing too fast (hyperventilation). °· An irregular heart rhythm (arrhythmia). °· A low red blood cell count (anemia). °· Pregnancy. °· Vomiting, diarrhea, fever, or other illnesses that cause body fluid loss (dehydration). °· Diseases or conditions such as Parkinson's disease, high blood pressure (hypertension), diabetes, and thyroid problems. °· Exposure to extreme heat. °DIAGNOSIS  °Your health care provider will ask about your symptoms, perform a physical exam, and perform an electrocardiogram (ECG) to record the electrical activity of your heart. Your health care provider may also perform other heart or blood tests to determine the cause of your dizziness. These may include: °· Transthoracic echocardiogram (TTE). During echocardiography, sound waves are  used to evaluate how blood flows through your heart. °· Transesophageal echocardiogram (TEE). °· Cardiac monitoring. This allows your health care provider to monitor your heart rate and rhythm in real time. °· Holter monitor. This is a portable device that records your heartbeat and can help diagnose heart arrhythmias. It allows your health care provider to track your heart activity for several days if needed. °· Stress tests by exercise or by giving medicine that makes the heart beat faster. °TREATMENT  °Treatment of dizziness depends on the cause of your symptoms and can vary greatly. °HOME CARE INSTRUCTIONS  °· Drink enough fluids to keep your urine clear or pale yellow. This is especially important in very hot weather. In older adults, it is also important in cold weather. °· Take your medicine exactly as directed if your dizziness is caused by medicines. When taking blood pressure medicines, it is especially important to get up slowly. °¨ Rise slowly from chairs and steady yourself until you feel okay. °¨ In the morning, first sit up on the side of the bed. When you feel okay, stand slowly while holding onto something until you know your balance is fine. °· Move your legs often if you need to stand in one place for a long time. Tighten and relax your muscles in your legs while standing. °· Have someone stay   with you for 1-2 days if dizziness continues to be a problem. Do this until you feel you are well enough to stay alone. Have the person call your health care provider if he or she notices changes in you that are concerning. °· Do not drive or use heavy machinery if you feel dizzy. °· Do not drink alcohol. °SEEK IMMEDIATE MEDICAL CARE IF:  °· Your dizziness or light-headedness gets worse. °· You feel nauseous or vomit. °· You have problems talking, walking, or using your arms, hands, or legs. °· You feel weak. °· You are not thinking clearly or you have trouble forming sentences. It may take a friend or  family member to notice this. °· You have chest pain, abdominal pain, shortness of breath, or sweating. °· Your vision changes. °· You notice any bleeding. °· You have side effects from medicine that seems to be getting worse rather than better. °MAKE SURE YOU:  °· Understand these instructions. °· Will watch your condition. °· Will get help right away if you are not doing well or get worse. °Document Released: 12/07/2000 Document Revised: 06/18/2013 Document Reviewed: 12/31/2010 °ExitCare® Patient Information ©2015 [employer]. This information is not intended to replace advice given to you by your health care provider. Make sure you discuss any questions you have with your health care provider. ° ° °

## 2014-12-21 NOTE — ED Notes (Signed)
Patient refused IV and IV fluids.

## 2014-12-21 NOTE — ED Notes (Addendum)
Patient c/o dizziness, "seeing spots", and hypertension x 6 days. Patient states she had a heart cath 6 days ago as well. Patient c/o left upper chest pain that she rates 6/10 and states she feels fluttering and SOB at rest.

## 2015-01-09 ENCOUNTER — Encounter: Payer: Self-pay | Admitting: *Deleted

## 2015-01-09 ENCOUNTER — Encounter: Payer: Medicaid Other | Attending: Cardiology | Admitting: *Deleted

## 2015-01-09 DIAGNOSIS — E119 Type 2 diabetes mellitus without complications: Secondary | ICD-10-CM | POA: Diagnosis present

## 2015-01-09 DIAGNOSIS — Z713 Dietary counseling and surveillance: Secondary | ICD-10-CM | POA: Diagnosis not present

## 2015-01-09 NOTE — Patient Instructions (Signed)
Goals:  Follow Diabetes Meal Plan as instructed  Eat 3 meals and 2 snacks, every 3-5 hrs  Limit carbohydrate intake to 30 grams carbohydrate/meal  Limit carbohydrate intake to 15 grams carbohydrate/snack  Add lean protein foods to meals/snacks  Monitor glucose levels as instructed by your doctor  Aim for 20 mins of physical activity daily  Take medications as prescribed  Practice self-care in the form of foot exams, eye exams, dental exams, and psychological services

## 2015-01-09 NOTE — Progress Notes (Signed)
Diabetes Self-Management Education  Visit Type: First/Initial (diagnosed 4 years )  Appt. Start Time: 0900 Appt. End Time: 9211  01/09/2015  [name], identified by name and date of birth, is a 44 y.o. female with a diagnosis of Diabetes: Type 2.  Other people present during visit:  Friend   ASSESSMENT  Shayli states she was diagnosed with diabetes ~4 years ago.  She was seen by this provider 2 years ago for nutrition education/counseling, then attended a series of 3 classes on diabetes self-management education.  She denies receiving education in the past.  She confirms symptoms of depression and accepted card for [facility], mobile crisis line.      Initial Visit Information:  Are you currently following a meal plan?: No   Are you taking your medications as prescribed?: No (doesn't want to take any of them) Are you checking your feet?: Yes   How often do you need to have someone help you when you read instructions, pamphlets, or other written materials from your doctor or pharmacy?: 1 - Never What is the last grade level you completed in school?: 11th grade  Psychosocial:     Patient Belief/Attitude about Diabetes: Defeat/Burnout Other persons present: Friend Patient Concerns: Nutrition/Meal planning (doesnt' want to eat because she's afraid of weight gain; states she goes days without eating and when she does eat, she eats "junk") Special Needs: None Preferred Learning Style: Hands on Learning Readiness: Contemplating  Complications:   Does not know her current HbA1c.  This is not included in the medical record from the referring provider How often do you check your blood sugar?: 0 times/day (not testing) Fasting Blood glucose range (mg/dL): 130-179 Postprandial Blood glucose range (mg/dL):  (doesn't check) Number of hypoglycemic episodes per month:  (gets dizzy every day) Have you had a dilated eye exam in the past 12 months?: Yes Have you had a  dental exam in the past 12 months?: No  Diet Intake:  Breakfast:  (likes to eat "junk" goes days without eating.  doesnt' want to gain weight) Snack (afternoon): fruit Dinner: one meal/day  Exercise:  Exercise: ADL's (had surgery 1 month ago and isn't cleared for exercise)  Individualized Plan for Diabetes Self-Management Training:   Learning Objective:  Patient will have a greater understanding of diabetes self-management.  Patient education plan per assessed needs and concerns is to attend individual sessions      Education Topics Reviewed with Patient Today:  Definition of diabetes, type 1 and 2, and the diagnosis of diabetes, Factors that contribute to the development of diabetes, Explored patient's options for treatment of their diabetes Role of diet in the treatment of diabetes and the relationship between the three main macronutrients and blood glucose level Role of exercise on diabetes management, blood pressure control and cardiac health. Reviewed patients medication for diabetes, action, purpose, timing of dose and side effects. Purpose and frequency of SMBG., Daily foot exams, Yearly dilated eye exam Taught treatment of hypoglycemia - the 15 rule. Relationship between chronic complications and blood glucose control, Assessed and discussed foot care and prevention of foot problems, Dental care, Retinopathy and reason for yearly dilated eye exams Worked with patient to identify barriers to care and solutions, Identified and addressed patients feelings and concerns about diabetes      PATIENTS GOALS/Plan (Developed by the patient):  Nutrition: General guidelines for healthy choices and portions discussed Physical Activity: Exercise 5-7 days per week Medications: take my medication as prescribed Monitoring : test  my blood glucose as discussed (note x per day with comment)  Plan:   Patient Instructions  Goals:  Follow Diabetes Meal Plan as instructed  Eat 3 meals  and 2 snacks, every 3-5 hrs  Limit carbohydrate intake to 30 grams carbohydrate/meal  Limit carbohydrate intake to 15 grams carbohydrate/snack  Add lean protein foods to meals/snacks  Monitor glucose levels as instructed by your doctor  Aim for 20 mins of physical activity daily  Take medications as prescribed  Practice self-care in the form of foot exams, eye exams, dental exams, and psychological services   Expected Outcomes:  Demonstrated interest in learning. Expect positive outcomes  Education material provided: Living Well with Diabetes and Meal plan card  If problems or questions, patient to contact team via:  Phone  Future DSME appointment: 4-6 wks

## 2015-02-20 ENCOUNTER — Ambulatory Visit: Payer: Medicaid Other | Admitting: *Deleted

## 2015-03-06 ENCOUNTER — Encounter (HOSPITAL_COMMUNITY): Payer: Self-pay

## 2015-03-06 ENCOUNTER — Emergency Department (HOSPITAL_COMMUNITY)
Admission: EM | Admit: 2015-03-06 | Discharge: 2015-03-06 | Disposition: A | Discharge status: Home / Self Care | Payer: Medicaid Other | Attending: Emergency Medicine | Admitting: Emergency Medicine

## 2015-03-06 DIAGNOSIS — R011 Cardiac murmur, unspecified: Secondary | ICD-10-CM | POA: Insufficient documentation

## 2015-03-06 DIAGNOSIS — I10 Essential (primary) hypertension: Secondary | ICD-10-CM | POA: Diagnosis not present

## 2015-03-06 DIAGNOSIS — J45909 Unspecified asthma, uncomplicated: Secondary | ICD-10-CM | POA: Diagnosis not present

## 2015-03-06 DIAGNOSIS — E669 Obesity, unspecified: Secondary | ICD-10-CM | POA: Insufficient documentation

## 2015-03-06 DIAGNOSIS — Z9889 Other specified postprocedural states: Secondary | ICD-10-CM | POA: Insufficient documentation

## 2015-03-06 DIAGNOSIS — R1013 Epigastric pain: Secondary | ICD-10-CM | POA: Diagnosis not present

## 2015-03-06 DIAGNOSIS — Z7982 Long term (current) use of aspirin: Secondary | ICD-10-CM | POA: Diagnosis not present

## 2015-03-06 DIAGNOSIS — E119 Type 2 diabetes mellitus without complications: Secondary | ICD-10-CM | POA: Diagnosis not present

## 2015-03-06 DIAGNOSIS — Q210 Ventricular septal defect: Secondary | ICD-10-CM | POA: Diagnosis not present

## 2015-03-06 DIAGNOSIS — Z79899 Other long term (current) drug therapy: Secondary | ICD-10-CM | POA: Insufficient documentation

## 2015-03-06 MED ORDER — OMEPRAZOLE 20 MG PO CPDR: 20.0000 mg | DELAYED_RELEASE_CAPSULE | Freq: Every day | ORAL | Status: DC

## 2015-03-06 MED ORDER — FAMOTIDINE 20 MG PO TABS: 20.0000 mg | ORAL_TABLET | Freq: Two times a day (BID) | ORAL | Status: DC

## 2015-03-06 NOTE — ED Notes (Signed)
Pt states abdominal pain x 2 months.  States it gets worse with meat.  Ate poorly yesterday. Nausea with no vomiting.  Denies changes in urination, vaginal discharge or fever.

## 2015-03-06 NOTE — ED Notes (Signed)
NURSE WILL GET BLOOD WHILE STARTING IV

## 2015-03-06 NOTE — ED Provider Notes (Signed)
CSN: [id number]     Arrival date & time 03/06/15  1009 History   First MD Initiated Contact with Patient 03/06/15 1101     Chief Complaint  Patient presents with  . Abdominal Pain     (Consider location/radiation/quality/duration/timing/severity/associated sxs/prior Treatment) HPI   44 year old female with abdominal pain. Has been ongoing for the past 2 months. Waxes and wanes. Seems to be precipitated after eating any type of meat. Pain is burning in nature located in the epigastrium. Does not radiate. No other associated symptoms. No nausea or vomiting. No urinary complaints. No fevers or chills. Has never sought formal evaluation for this. No interventions tried.  Past Medical History  Diagnosis Date  . Obesity   . Heart murmur   . Congenital ventricular septal defect   . Asthma   . Diabetes mellitus without complication   . VSD (ventricular septal defect), perimembranous     Qp:Qs 1.7:1 by catheterization   . Pulmonary hypertension     mild   . Tricuspid regurgitation    Past Surgical History  Procedure Laterality Date  . Tubal ligation    . Cyst excision    . Left and right heart catheterization with coronary angiogram [name] 09/11/2013    Procedure: LEFT AND RIGHT HEART CATHETERIZATION WITH CORONARY ANGIOGRAM;  Surgeon: [name], MD;  Location: [facility] CATH LAB;  Service: Cardiovascular;  Laterality: [name];  . Cardiac catheterization [name] 10/30/2014    Procedure: Right Heart Cath;  Surgeon: [name], MD;  Location: [facility] INVASIVE CV LAB CUPID;  Service: Cardiovascular;  Laterality: [name];   Family History  Problem Relation Age of Onset  . Cancer Other   . Hyperlipidemia Other   . Hypertension Other   . Asthma Other   . Diabetes Mother   . Diabetes Father    Social History  Substance Use Topics  . Smoking status: Never Smoker   . Smokeless tobacco: Never Used  . Alcohol Use: No   OB History    No data available     Review of Systems  All systems reviewed and  negative, other than as noted in HPI.   Allergies  Penicillins; Tape; Other; and Latex  Home Medications   Prior to Admission medications   Medication Sig Start Date End Date Taking? Authorizing Provider  acyclovir (ZOVIRAX) 400 MG tablet Take 400 mg by mouth 2 (two) times daily as needed (fever blisters).    Yes Historical Provider, MD  albuterol (PROVENTIL) (2.5 MG/3ML) 0.083% nebulizer solution Take 2.5 mg by nebulization every 6 (six) hours as needed for wheezing or shortness of breath.   Yes Historical Provider, MD  Alogliptin Benzoate (NESINA) 25 MG TABS Take 25 mg by mouth every morning.   Yes Historical Provider, MD  amLODipine (NORVASC) 5 MG tablet Take 1 tablet by mouth daily. 08/19/14  Yes Historical Provider, MD  aspirin 81 MG chewable tablet Chew 81 mg by mouth daily.   Yes Historical Provider, MD  metFORMIN (GLUCOPHAGE) 1000 MG tablet Take 1,000 mg by mouth daily with breakfast.   Yes Historical Provider, MD  traMADol (ULTRAM) 50 MG tablet Take 1 tablet (50 mg total) by mouth every 6 (six) hours as needed. 11/08/14  Yes Glendell Docker, NP  famotidine (PEPCID) 20 MG tablet Take 1 tablet (20 mg total) by mouth 2 (two) times daily. 03/06/15   Virgel Manifold, MD  ibuprofen (ADVIL,MOTRIN) 800 MG tablet Take 1 tablet (800 mg total) by mouth 3 (three) times daily. Patient not taking:  Reported on 12/04/2014 11/08/14   Glendell Docker, NP  naproxen sodium (ANAPROX) 550 MG tablet Take 1 tablet (550 mg total) by mouth 2 (two) times daily with a meal. Patient not taking: Reported on 01/09/2015 12/04/14   Comer Locket, PA-C  omeprazole (PRILOSEC) 20 MG capsule Take 1 capsule (20 mg total) by mouth daily. 03/06/15   Virgel Manifold, MD   BP 132/64 mmHg  Pulse 84  Temp(Src) 98.2 F (36.8 C) (Oral)  Resp 16  SpO2 100%  LMP 02/26/2015 Physical Exam  Constitutional: She appears well-developed and well-nourished. No distress.  HENT:  Head: Normocephalic and atraumatic.  Eyes: Conjunctivae are  normal. Right eye exhibits no discharge. Left eye exhibits no discharge.  Neck: Neck supple.  Cardiovascular: Normal rate, regular rhythm and normal heart sounds.  Exam reveals no gallop and no friction rub.   No murmur heard. Pulmonary/Chest: Effort normal and breath sounds normal. No respiratory distress.  Abdominal: Soft. She exhibits no distension. There is no tenderness.  Musculoskeletal: She exhibits no edema or tenderness.  Neurological: She is alert.  Skin: Skin is warm and dry.  Psychiatric: She has a normal mood and affect. Her behavior is normal. Thought content normal.  Nursing note and vitals reviewed.   ED Course  Procedures (including critical care time) Labs Review Labs Reviewed - No data to display  Imaging Review No results found. I have personally reviewed and evaluated these images and lab results as part of my medical decision-making.   EKG Interpretation None      MDM   Final diagnoses:  Epigastric pain    44yF with epigastric pain consistently precipitated after eating various meats. Abdominal exam benign. Low suspicion for emergent process. Will give trial of PPI. Very harsh holosystolic murmur noted, but per review of records has hx of VSD and followed by cards. I do not feel related to presenting complaint.      Virgel Manifold, MD 03/20/15 [phone]

## 2015-03-06 NOTE — Discharge Instructions (Signed)

## 2015-03-12 ENCOUNTER — Emergency Department (HOSPITAL_COMMUNITY)
Admission: EM | Admit: 2015-03-12 | Discharge: 2015-03-12 | Disposition: A | Discharge status: Home / Self Care | Payer: Medicaid Other | Attending: Emergency Medicine | Admitting: Emergency Medicine

## 2015-03-12 ENCOUNTER — Encounter (HOSPITAL_COMMUNITY): Payer: Self-pay | Admitting: *Deleted

## 2015-03-12 DIAGNOSIS — Z79899 Other long term (current) drug therapy: Secondary | ICD-10-CM | POA: Diagnosis not present

## 2015-03-12 DIAGNOSIS — Z9889 Other specified postprocedural states: Secondary | ICD-10-CM | POA: Insufficient documentation

## 2015-03-12 DIAGNOSIS — Z880 Allergy status to penicillin: Secondary | ICD-10-CM | POA: Insufficient documentation

## 2015-03-12 DIAGNOSIS — E119 Type 2 diabetes mellitus without complications: Secondary | ICD-10-CM | POA: Diagnosis not present

## 2015-03-12 DIAGNOSIS — R011 Cardiac murmur, unspecified: Secondary | ICD-10-CM | POA: Insufficient documentation

## 2015-03-12 DIAGNOSIS — Z91040 Latex allergy status: Secondary | ICD-10-CM | POA: Diagnosis not present

## 2015-03-12 DIAGNOSIS — M5441 Lumbago with sciatica, right side: Secondary | ICD-10-CM

## 2015-03-12 DIAGNOSIS — I10 Essential (primary) hypertension: Secondary | ICD-10-CM | POA: Insufficient documentation

## 2015-03-12 DIAGNOSIS — J45909 Unspecified asthma, uncomplicated: Secondary | ICD-10-CM | POA: Diagnosis not present

## 2015-03-12 DIAGNOSIS — Q210 Ventricular septal defect: Secondary | ICD-10-CM | POA: Diagnosis not present

## 2015-03-12 DIAGNOSIS — Z7982 Long term (current) use of aspirin: Secondary | ICD-10-CM | POA: Diagnosis not present

## 2015-03-12 DIAGNOSIS — E669 Obesity, unspecified: Secondary | ICD-10-CM | POA: Diagnosis not present

## 2015-03-12 DIAGNOSIS — M545 Low back pain: Secondary | ICD-10-CM | POA: Diagnosis present

## 2015-03-12 MED ORDER — METHOCARBAMOL 500 MG PO TABS: 500.0000 mg | ORAL_TABLET | Freq: Two times a day (BID) | ORAL | Status: DC | PRN

## 2015-03-12 MED ORDER — NAPROXEN 250 MG PO TABS: 250.0000 mg | ORAL_TABLET | Freq: Two times a day (BID) | ORAL | Status: DC

## 2015-03-12 MED ORDER — OXYCODONE-ACETAMINOPHEN 5-325 MG PO TABS
1.0000 | ORAL_TABLET | Freq: Once | ORAL | Status: AC
  Administered 2015-03-12: 1 via ORAL
  Filled 2015-03-12: qty 1

## 2015-03-12 NOTE — Discharge Instructions (Signed)

## 2015-03-12 NOTE — ED Provider Notes (Signed)
CSN: [id number]   Arrival date & time 03/12/15 1116  History  This chart was scribed for non-physician practitioner,  Waynetta Pean, PA-C working with Serita Grit, MD by Altamease Oiler, ED Scribe. This patient was seen in room WTR6/WTR6 and the patient's care was started at 12:20 PM.  Chief Complaint  Patient presents with  . Back Pain  . Hip Pain    HPI The history is provided by the patient. No language interpreter was used.   Mackenzie Patterson is a 44 y.o. female who presents to the Emergency Department complaining of new  right lower back pain with onset 1 week ago. The pain radiates to the right hip and right posterior buttock. She rates the pain 8/10 in severity at present. The pain is worse with movement, sitting and walking. Ibuprofen and tramadol provided insufficient pain relief PTA. Pt denies weight loss, extremity numbness or tingling, extremity weakness, incontinence of bowel or bladder, fever, nausea, vomiting, dysuria, hematuria increased frequency, increased urgency, vaginal discharge, and vaginal bleeding.  No personal history of IV drug use or cancer. She attempted to see her PCP for the pain but he has no available appointments.   Past Medical History  Diagnosis Date  . Obesity   . Heart murmur   . Congenital ventricular septal defect   . Asthma   . Diabetes mellitus without complication   . VSD (ventricular septal defect), perimembranous     Qp:Qs 1.7:1 by catheterization   . Pulmonary hypertension     mild   . Tricuspid regurgitation     Past Surgical History  Procedure Laterality Date  . Tubal ligation    . Cyst excision    . Left and right heart catheterization with coronary angiogram [name] 09/11/2013    Procedure: LEFT AND RIGHT HEART CATHETERIZATION WITH CORONARY ANGIOGRAM;  Surgeon: [name], MD;  Location: [facility] CATH LAB;  Service: Cardiovascular;  Laterality: [name];  . Cardiac catheterization [name] 10/30/2014    Procedure: Right Heart Cath;  Surgeon: Peter M  Martinique, MD;  Location: [facility] INVASIVE CV LAB CUPID;  Service: Cardiovascular;  Laterality: [name];    Family History  Problem Relation Age of Onset  . Cancer Other   . Hyperlipidemia Other   . Hypertension Other   . Asthma Other   . Diabetes Mother   . Diabetes Father     Social History  Substance Use Topics  . Smoking status: Never Smoker   . Smokeless tobacco: Never Used  . Alcohol Use: No     Review of Systems  Constitutional: Negative for fever and unexpected weight change.  Gastrointestinal: Negative for nausea and vomiting.  Genitourinary: Negative for dysuria, urgency, frequency, hematuria, vaginal bleeding and vaginal discharge.  Musculoskeletal: Positive for back pain and arthralgias.  Skin: Negative for rash.  Neurological: Negative for weakness and numbness.    Home Medications   Prior to Admission medications   Medication Sig Start Date End Date Taking? Authorizing Provider  acyclovir (ZOVIRAX) 400 MG tablet Take 400 mg by mouth 2 (two) times daily as needed (fever blisters).     Historical Provider, MD  albuterol (PROVENTIL) (2.5 MG/3ML) 0.083% nebulizer solution Take 2.5 mg by nebulization every 6 (six) hours as needed for wheezing or shortness of breath.    Historical Provider, MD  Alogliptin Benzoate (NESINA) 25 MG TABS Take 25 mg by mouth every morning.    Historical Provider, MD  amLODipine (NORVASC) 5 MG tablet Take 1 tablet by mouth daily. 08/19/14  Historical Provider, MD  aspirin 81 MG chewable tablet Chew 81 mg by mouth daily.    Historical Provider, MD  famotidine (PEPCID) 20 MG tablet Take 1 tablet (20 mg total) by mouth 2 (two) times daily. 03/06/15   Virgel Manifold, MD  metFORMIN (GLUCOPHAGE) 1000 MG tablet Take 1,000 mg by mouth daily with breakfast.    Historical Provider, MD  methocarbamol (ROBAXIN) 500 MG tablet Take 1 tablet (500 mg total) by mouth 2 (two) times daily as needed for muscle spasms. 03/12/15   Waynetta Pean, PA-C  naproxen (NAPROSYN) 250 MG  tablet Take 1 tablet (250 mg total) by mouth 2 (two) times daily with a meal. 03/12/15   Waynetta Pean, PA-C  omeprazole (PRILOSEC) 20 MG capsule Take 1 capsule (20 mg total) by mouth daily. 03/06/15   Virgel Manifold, MD  traMADol (ULTRAM) 50 MG tablet Take 1 tablet (50 mg total) by mouth every 6 (six) hours as needed. 11/08/14   Glendell Docker, NP    Allergies  Penicillins; Tape; Other; and Latex  Triage Vitals: BP 121/74 mmHg  Pulse 92  Temp(Src) 98.1 F (36.7 C) (Oral)  Resp 18  SpO2 100%  LMP 02/26/2015  Physical Exam  Constitutional: She appears well-developed and well-nourished. No distress.  Nontoxic appearing.  HENT:  Head: Normocephalic and atraumatic.  Eyes: Conjunctivae are normal. Pupils are equal, round, and reactive to light. Right eye exhibits no discharge. Left eye exhibits no discharge.  Neck: Neck supple.  Cardiovascular: Normal rate, regular rhythm and intact distal pulses.   Murmur heard. Bilateral DP/PT pulses intact  Pulmonary/Chest: Effort normal and breath sounds normal. No respiratory distress. She has no wheezes. She has no rales.  Abdominal: Soft. There is no tenderness.  Musculoskeletal: She exhibits no edema.  No midline back tenderness. Right low back is TTP. No back edema, erythema, ecchymosis or deformity noted. No BLE edema or tenderness. Patient has good strength in her bilateral lower extremities.  Lymphadenopathy:    She has no cervical adenopathy.  Neurological: She is alert. She has normal reflexes. She displays normal reflexes. Coordination normal.  Bilateral patellar DTRs intact Able to ambulate in the room Sensation is intact in her bilateral lower extremities.  Skin: Skin is warm and dry. No rash noted. She is not diaphoretic. No erythema. No pallor.  Psychiatric: She has a normal mood and affect. Her behavior is normal.  Nursing note and vitals reviewed.   ED Course  Procedures  DIAGNOSTIC STUDIES: Oxygen Saturation is 100% on RA,   normal by my interpretation.    COORDINATION OF CARE: 12:27 PM Discussed treatment plan which includes naproxen and a muscle relaxer with pt at bedside and pt agreed to the plan.  Labs Review- Labs Reviewed - No data to display  Imaging Review No results found.      MDM   Meds given in ED:  Medications  oxyCODONE-acetaminophen (PERCOCET/ROXICET) 5-325 MG per tablet 1 tablet (not administered)    New Prescriptions   METHOCARBAMOL (ROBAXIN) 500 MG TABLET    Take 1 tablet (500 mg total) by mouth 2 (two) times daily as needed for muscle spasms.   NAPROXEN (NAPROSYN) 250 MG TABLET    Take 1 tablet (250 mg total) by mouth 2 (two) times daily with a meal.    Final diagnoses:  Right-sided low back pain with right-sided sciatica   Patient with right low back pain that radiates to her posterior buttocks.   No neurological deficits and normal neuro exam.  Patient can walk but states is painful.  No loss of bowel or bladder control.  No concern for cauda equina.  No fever, night sweats, weight loss, h/o cancer, IVDU.  I advised patient to stop taking ibuprofen will try a short course of Naprosyn. Also provide a prescription for Robaxin. I encouraged close follow-up with primary care. Patient's creatinine is within normal limits from her last blood work. Back exercises.  RICE protocol and pain medicine indicated and discussed with patient.    I personally performed the services described in this documentation, which was scribed in my presence. The recorded information has been reviewed and is accurate.        Waynetta Pean, PA-C 03/12/15 [place], MD 03/13/15 1346

## 2015-03-12 NOTE — ED Notes (Addendum)
Pt report right hip and right lower back x 1 week, sts taking tramadol without relief, denies injury, sts pain is worse with movement.

## 2015-04-15 ENCOUNTER — Institutional Professional Consult (permissible substitution) (INDEPENDENT_AMBULATORY_CARE_PROVIDER_SITE_OTHER): Payer: Medicaid Other | Admitting: Surgery

## 2015-04-15 ENCOUNTER — Encounter: Payer: Self-pay | Admitting: Surgery

## 2015-04-15 VITALS — BP 144/84 | HR 94 | Resp 16 | Ht 59.0 in | Wt 161.0 lb

## 2015-04-15 DIAGNOSIS — Q210 Ventricular septal defect: Secondary | ICD-10-CM

## 2015-04-15 DIAGNOSIS — I071 Rheumatic tricuspid insufficiency: Secondary | ICD-10-CM

## 2015-04-15 NOTE — Progress Notes (Signed)
HPI:  The patient is a 44 year old woman with a long history of a perimembranous VSD who has had progressive symptoms of shortness of breath. She was evaluated by Dr. Roxy Manns last year and was scheduled for repair of the VSD and possible tricuspid valve repair or replacement of moderate TR. She then cancelled surgery and decided to go to [facility] for a second opinion. She says that at [facility] she was told that she did not need surgery yet. She had a second right heart cath here in May 2016 that showed moderate pulmonary hypertension and an increase in the PVR to 4 [place] units from 2 previously. The shunt ratio was 1.44:1 and had been measured at 1.7:1 a year ago. It was recommended by Dr. Meda Coffee that she have surgical repair and she was referred to me at that time because she wanted to see a different surgeon. She did not show up for her appt. She now returns today but says that she thought she was coming to this appt for some GI problems she has been having. She says that her VSD is being followed at [facility] and she did not want to discuss it with me since she has follow up at [facility].   Current Outpatient Prescriptions  Medication Sig Dispense Refill  . albuterol (PROVENTIL) (2.5 MG/3ML) 0.083% nebulizer solution Take 2.5 mg by nebulization every 6 (six) hours as needed for wheezing or shortness of breath.    Marland Kitchen amLODipine (NORVASC) 5 MG tablet Take 1 tablet by mouth daily.  4  . aspirin 81 MG chewable tablet Chew 81 mg by mouth daily.    . traMADol (ULTRAM) 50 MG tablet Take 1 tablet (50 mg total) by mouth every 6 (six) hours as needed. 15 tablet 0  . [DISCONTINUED] furosemide (LASIX) 20 MG tablet Take 1 tablet (20 mg total) by mouth daily. (Patient not taking: Reported on 09/14/2014) 90 tablet 6   No current facility-administered medications for this visit.     Physical Exam:  Not done  Diagnostic Tests:     Name ID Temporary Type Supply   No information to display    PACS Images    Show  images for Cardiac catheterization     Link to Procedure Log    Procedure Log      Hemo Data       Most Recent Value   Fick Cardiac Output  4.01 L/min   Fick Cardiac Output Index  2.12 (L/min)/BSA   RA A Wave  13 mmHg   RA V Wave  13 mmHg   RA Mean  10 mmHg   RV Systolic Pressure  61 mmHg   RV Diastolic Pressure  2 mmHg   RV EDP  9 mmHg   PA Systolic Pressure  56 mmHg   PA Diastolic Pressure  22 mmHg   PA Mean  38 mmHg   PW A Wave  16 mmHg   PW V Wave  14 mmHg   PW Mean  14 mmHg   QP/QS  1.44   TPVR Index  12.39 HRUI    Order-Level Documents:    There are no order-level documents.    Encounter-Level Documents - 10/27/14:      Scan on 11/03/2014 11:02 AM by Provider Default, MDScan on 11/03/2014 11:02 AM by Provider Default, MD     Scan on 10/31/2014 10:14 AM by Provider Default, MDScan on 10/31/2014 10:14 AM by Provider Default, MD     Scan  on 10/31/2014 10:01 AM by Provider Default, MDScan on 10/31/2014 10:01 AM by Provider Default, MD     Scan on 10/30/2014 10:15 AM by Provider Default, MDScan on 10/30/2014 10:15 AM by Provider Default, MD     Electronic signature on 10/30/2014 6:48 AM     Scan on 11/09/2014 8:28 AM by Provider Default, MDScan on 11/09/2014 8:28 AM by Provider Default, MD    Signed    Electronically signed by [name], MD on 10/30/14 at 1031 EDT       Impression:  I am not sure where the mix up occurred. She says that she does not want to have her surgery here and wants to follow up with [facility]. She thought she was coming to see me about GI problems despite the large sign on my door that says [facility].  She saw Dr. Roxy Manns in this office last year and cancelled at the last minute.   Plan:  She will follow up with the [place] surgeons concerning her VSD.   Gaye Pollack, MD [facility] [phone]

## 2015-04-17 ENCOUNTER — Encounter (HOSPITAL_COMMUNITY): Payer: Self-pay | Admitting: Emergency Medicine

## 2015-04-17 ENCOUNTER — Emergency Department (HOSPITAL_COMMUNITY)
Admission: EM | Admit: 2015-04-17 | Discharge: 2015-04-17 | Disposition: A | Discharge status: Home / Self Care | Payer: Medicaid Other | Attending: Emergency Medicine | Admitting: Emergency Medicine

## 2015-04-17 DIAGNOSIS — Z79899 Other long term (current) drug therapy: Secondary | ICD-10-CM | POA: Insufficient documentation

## 2015-04-17 DIAGNOSIS — R011 Cardiac murmur, unspecified: Secondary | ICD-10-CM | POA: Diagnosis not present

## 2015-04-17 DIAGNOSIS — Z7982 Long term (current) use of aspirin: Secondary | ICD-10-CM | POA: Insufficient documentation

## 2015-04-17 DIAGNOSIS — J45909 Unspecified asthma, uncomplicated: Secondary | ICD-10-CM | POA: Diagnosis not present

## 2015-04-17 DIAGNOSIS — W57XXXA Bitten or stung by nonvenomous insect and other nonvenomous arthropods, initial encounter: Secondary | ICD-10-CM | POA: Diagnosis not present

## 2015-04-17 DIAGNOSIS — Z9889 Other specified postprocedural states: Secondary | ICD-10-CM | POA: Diagnosis not present

## 2015-04-17 DIAGNOSIS — Y998 Other external cause status: Secondary | ICD-10-CM | POA: Insufficient documentation

## 2015-04-17 DIAGNOSIS — Z91040 Latex allergy status: Secondary | ICD-10-CM | POA: Diagnosis not present

## 2015-04-17 DIAGNOSIS — Q210 Ventricular septal defect: Secondary | ICD-10-CM | POA: Diagnosis not present

## 2015-04-17 DIAGNOSIS — E669 Obesity, unspecified: Secondary | ICD-10-CM | POA: Insufficient documentation

## 2015-04-17 DIAGNOSIS — E119 Type 2 diabetes mellitus without complications: Secondary | ICD-10-CM | POA: Insufficient documentation

## 2015-04-17 DIAGNOSIS — S40861A Insect bite (nonvenomous) of right upper arm, initial encounter: Secondary | ICD-10-CM | POA: Insufficient documentation

## 2015-04-17 DIAGNOSIS — Y9389 Activity, other specified: Secondary | ICD-10-CM | POA: Insufficient documentation

## 2015-04-17 DIAGNOSIS — Y9289 Other specified places as the place of occurrence of the external cause: Secondary | ICD-10-CM | POA: Diagnosis not present

## 2015-04-17 DIAGNOSIS — Z880 Allergy status to penicillin: Secondary | ICD-10-CM | POA: Diagnosis not present

## 2015-04-17 MED ORDER — DOXYCYCLINE HYCLATE 100 MG PO CAPS: 100.0000 mg | ORAL_CAPSULE | Freq: Two times a day (BID) | ORAL | Status: DC

## 2015-04-17 MED ORDER — HYDROXYZINE HCL 25 MG PO TABS: 25.0000 mg | ORAL_TABLET | Freq: Four times a day (QID) | ORAL | Status: DC

## 2015-04-17 NOTE — ED Provider Notes (Signed)
CSN: [id number]     Arrival date & time 04/17/15  1430 History  By signing my name below, I, Evelene Croon, attest that this documentation has been prepared under the direction and in the presence of non-physician practitioner, Irena Cords, PA-C. Electronically Signed: Evelene Croon, Scribe. 04/17/2015. 3:45 PM.   Chief Complaint  Patient presents with  . Insect Bite   The history is provided by the patient. No language interpreter was used.    HPI Comments:  Mackenzie Patterson is a 44 y.o. female who presents to the Emergency Department complaining of an insect bite to her right arm which she noticed this AM. She notes the site is itchy. Pt did not see an insect bite her. She has no other complaints or symptoms at this time. No alleviating factors noted.  Past Medical History  Diagnosis Date  . Obesity   . Heart murmur   . Congenital ventricular septal defect   . Asthma   . Diabetes mellitus without complication ([place])   . VSD (ventricular septal defect), perimembranous     Qp:Qs 1.7:1 by catheterization   . Pulmonary hypertension ([facility])     mild   . Tricuspid regurgitation    Past Surgical History  Procedure Laterality Date  . Tubal ligation    . Cyst excision    . Left and right heart catheterization with coronary angiogram [name] 09/11/2013    Procedure: LEFT AND RIGHT HEART CATHETERIZATION WITH CORONARY ANGIOGRAM;  Surgeon: [name], MD;  Location: [facility] CATH LAB;  Service: Cardiovascular;  Laterality: [name];  . Cardiac catheterization [name] 10/30/2014    Procedure: Right Heart Cath;  Surgeon: [name], MD;  Location: [facility] INVASIVE CV LAB CUPID;  Service: Cardiovascular;  Laterality: [name];   Family History  Problem Relation Age of Onset  . Cancer Other   . Hyperlipidemia Other   . Hypertension Other   . Asthma Other   . Diabetes Mother   . Diabetes Father    Social History  Substance Use Topics  . Smoking status: Never Smoker   . Smokeless tobacco: Never Used  . Alcohol  Use: No   OB History    No data available     Review of Systems  10 systems reviewed and all are negative for acute change except as noted in the HPI.  Allergies  Penicillins; Tape; Other; and Latex  Home Medications   Prior to Admission medications   Medication Sig Start Date End Date Taking? Authorizing Provider  albuterol (PROVENTIL) (2.5 MG/3ML) 0.083% nebulizer solution Take 2.5 mg by nebulization every 6 (six) hours as needed for wheezing or shortness of breath.    Historical Provider, MD  amLODipine (NORVASC) 5 MG tablet Take 1 tablet by mouth daily. 08/19/14   Historical Provider, MD  aspirin 81 MG chewable tablet Chew 81 mg by mouth daily.    Historical Provider, MD  traMADol (ULTRAM) 50 MG tablet Take 1 tablet (50 mg total) by mouth every 6 (six) hours as needed. 11/08/14   Glendell Docker, NP   BP 154/88 mmHg  Pulse 92  Temp(Src) 98.4 F (36.9 C) (Oral)  Resp 16  SpO2 97% Physical Exam  Constitutional: She is oriented to person, place, and time. She appears well-developed and well-nourished.  HENT:  Head: Normocephalic and atraumatic.  Eyes: Conjunctivae are normal.  Cardiovascular: Normal rate.   Pulmonary/Chest: Effort normal.  Neurological: She is alert and oriented to person, place, and time.  Skin: Skin is warm and dry.  Psychiatric: She has a normal mood and affect.  Nursing note and vitals reviewed.   ED Course  Procedures   DIAGNOSTIC STUDIES:  Oxygen Saturation is 97% on RA, normal by my interpretation.    COORDINATION OF CARE:  3:41 PM Discussed treatment plan with pt at bedside and pt agreed to plan.  I personally performed the services described in this documentation, which was scribed in my presence. The recorded information has been reviewed and is accurate.   Dalia Heading, PA-C 04/17/15 [place], MD 04/17/15 2328

## 2015-04-17 NOTE — Discharge Instructions (Signed)
Return here as needed. Use cool compresses on the area.

## 2015-04-17 NOTE — ED Notes (Signed)
Pt believes she was bit by an insect.  Noticed a bump on her upper rt arm this morning.  Also has a bump in her mouth.

## 2015-04-20 ENCOUNTER — Other Ambulatory Visit: Payer: Self-pay | Admitting: Cardiology

## 2015-04-20 DIAGNOSIS — R109 Unspecified abdominal pain: Secondary | ICD-10-CM

## 2015-04-22 LAB — CYTOLOGY - PAP: PAP SMEAR: NEGATIVE

## 2015-04-23 ENCOUNTER — Inpatient Hospital Stay: Admission: RE | Admit: 2015-04-23 | Payer: Medicaid Other | Source: Ambulatory Visit

## 2015-04-29 ENCOUNTER — Ambulatory Visit: Payer: Medicaid Other | Admitting: Cardiology

## 2015-05-05 ENCOUNTER — Encounter: Payer: Self-pay | Admitting: Cardiology

## 2015-05-13 ENCOUNTER — Encounter (HOSPITAL_COMMUNITY): Payer: Self-pay | Admitting: Emergency Medicine

## 2015-05-13 ENCOUNTER — Emergency Department (HOSPITAL_COMMUNITY): Payer: No Typology Code available for payment source

## 2015-05-13 ENCOUNTER — Emergency Department (HOSPITAL_COMMUNITY)
Admission: EM | Admit: 2015-05-13 | Discharge: 2015-05-13 | Disposition: A | Discharge status: Home / Self Care | Payer: No Typology Code available for payment source | Attending: Emergency Medicine | Admitting: Emergency Medicine

## 2015-05-13 DIAGNOSIS — Z9889 Other specified postprocedural states: Secondary | ICD-10-CM | POA: Diagnosis not present

## 2015-05-13 DIAGNOSIS — Z79899 Other long term (current) drug therapy: Secondary | ICD-10-CM | POA: Diagnosis not present

## 2015-05-13 DIAGNOSIS — S0081XA Abrasion of other part of head, initial encounter: Secondary | ICD-10-CM | POA: Insufficient documentation

## 2015-05-13 DIAGNOSIS — E669 Obesity, unspecified: Secondary | ICD-10-CM | POA: Diagnosis not present

## 2015-05-13 DIAGNOSIS — S3991XA Unspecified injury of abdomen, initial encounter: Secondary | ICD-10-CM | POA: Insufficient documentation

## 2015-05-13 DIAGNOSIS — Y9389 Activity, other specified: Secondary | ICD-10-CM | POA: Diagnosis not present

## 2015-05-13 DIAGNOSIS — Z880 Allergy status to penicillin: Secondary | ICD-10-CM | POA: Diagnosis not present

## 2015-05-13 DIAGNOSIS — S199XXA Unspecified injury of neck, initial encounter: Secondary | ICD-10-CM | POA: Insufficient documentation

## 2015-05-13 DIAGNOSIS — Z8679 Personal history of other diseases of the circulatory system: Secondary | ICD-10-CM | POA: Insufficient documentation

## 2015-05-13 DIAGNOSIS — Z7984 Long term (current) use of oral hypoglycemic drugs: Secondary | ICD-10-CM | POA: Diagnosis not present

## 2015-05-13 DIAGNOSIS — R011 Cardiac murmur, unspecified: Secondary | ICD-10-CM | POA: Insufficient documentation

## 2015-05-13 DIAGNOSIS — Q210 Ventricular septal defect: Secondary | ICD-10-CM | POA: Insufficient documentation

## 2015-05-13 DIAGNOSIS — E119 Type 2 diabetes mellitus without complications: Secondary | ICD-10-CM | POA: Insufficient documentation

## 2015-05-13 DIAGNOSIS — Z3202 Encounter for pregnancy test, result negative: Secondary | ICD-10-CM | POA: Insufficient documentation

## 2015-05-13 DIAGNOSIS — J45909 Unspecified asthma, uncomplicated: Secondary | ICD-10-CM | POA: Diagnosis not present

## 2015-05-13 DIAGNOSIS — S00532A Contusion of oral cavity, initial encounter: Secondary | ICD-10-CM | POA: Insufficient documentation

## 2015-05-13 DIAGNOSIS — R110 Nausea: Secondary | ICD-10-CM | POA: Diagnosis not present

## 2015-05-13 DIAGNOSIS — S4992XA Unspecified injury of left shoulder and upper arm, initial encounter: Secondary | ICD-10-CM | POA: Diagnosis not present

## 2015-05-13 DIAGNOSIS — M542 Cervicalgia: Secondary | ICD-10-CM

## 2015-05-13 DIAGNOSIS — S00512A Abrasion of oral cavity, initial encounter: Secondary | ICD-10-CM | POA: Insufficient documentation

## 2015-05-13 DIAGNOSIS — Y92410 Unspecified street and highway as the place of occurrence of the external cause: Secondary | ICD-10-CM | POA: Insufficient documentation

## 2015-05-13 DIAGNOSIS — Z91040 Latex allergy status: Secondary | ICD-10-CM | POA: Insufficient documentation

## 2015-05-13 DIAGNOSIS — S4991XA Unspecified injury of right shoulder and upper arm, initial encounter: Secondary | ICD-10-CM | POA: Diagnosis not present

## 2015-05-13 DIAGNOSIS — Y998 Other external cause status: Secondary | ICD-10-CM | POA: Diagnosis not present

## 2015-05-13 LAB — URINE MICROSCOPIC-ADD ON

## 2015-05-13 LAB — URINALYSIS, ROUTINE W REFLEX MICROSCOPIC
Bilirubin Urine: NEGATIVE
Glucose, UA: NEGATIVE mg/dL
Ketones, ur: NEGATIVE mg/dL
Leukocytes, UA: NEGATIVE
NITRITE: NEGATIVE
Protein, ur: NEGATIVE mg/dL
Specific Gravity, Urine: 1.008 (ref 1.005–1.030)
pH: 6.5 (ref 5.0–8.0)

## 2015-05-13 LAB — COMPREHENSIVE METABOLIC PANEL
ALBUMIN: 5 g/dL (ref 3.5–5.0)
ALT: 14 U/L (ref 14–54)
AST: 27 U/L (ref 15–41)
Alkaline Phosphatase: 75 U/L (ref 38–126)
Anion gap: 7 (ref 5–15)
BUN: 12 mg/dL (ref 6–20)
CHLORIDE: 104 mmol/L (ref 101–111)
CO2: 27 mmol/L (ref 22–32)
Calcium: 9.9 mg/dL (ref 8.9–10.3)
Creatinine, Ser: 0.89 mg/dL (ref 0.44–1.00)
GFR calc Af Amer: >60 mL/min (ref >60)
GFR calc non Af Amer: >60 mL/min (ref >60)
GLUCOSE: 150 mg/dL — AB (ref 65–99)
POTASSIUM: 4.2 mmol/L (ref 3.5–5.1)
Sodium: 138 mmol/L (ref 135–145)
Total Bilirubin: 0.8 mg/dL (ref 0.3–1.2)
Total Protein: 8.5 g/dL — ABNORMAL HIGH (ref 6.5–8.1)

## 2015-05-13 LAB — CBC WITH DIFFERENTIAL/PLATELET
BASOS ABS: 0 10*3/uL (ref 0.0–0.1)
Basophils Relative: 0 %
Eosinophils Absolute: 0.1 10*3/uL (ref 0.0–0.7)
Eosinophils Relative: 1 %
HCT: 38 % (ref 36.0–46.0)
HEMOGLOBIN: 11.6 g/dL — AB (ref 12.0–15.0)
LYMPHS PCT: 13 %
Lymphs Abs: 1.1 10*3/uL (ref 0.7–4.0)
MCH: 22.1 pg — ABNORMAL LOW (ref 26.0–34.0)
MCHC: 30.5 g/dL (ref 30.0–36.0)
MCV: 72.5 fL — ABNORMAL LOW (ref 78.0–100.0)
Monocytes Absolute: 0.5 10*3/uL (ref 0.1–1.0)
Monocytes Relative: 6 %
NEUTROS ABS: 6.8 10*3/uL (ref 1.7–7.7)
Neutrophils Relative %: 80 %
Platelets: 295 10*3/uL (ref 150–400)
RBC: 5.24 MIL/uL — AB (ref 3.87–5.11)
RDW: 16.5 % — AB (ref 11.5–15.5)
WBC: 8.5 10*3/uL (ref 4.0–10.5)

## 2015-05-13 LAB — PREGNANCY, URINE: PREG TEST UR: NEGATIVE

## 2015-05-13 MED ORDER — HYDROCODONE-ACETAMINOPHEN 5-325 MG PO TABS: 2.0000 | ORAL_TABLET | ORAL | Status: DC | PRN

## 2015-05-13 MED ORDER — IBUPROFEN 600 MG PO TABS: 600.0000 mg | ORAL_TABLET | Freq: Four times a day (QID) | ORAL | Status: DC | PRN

## 2015-05-13 MED ORDER — TRAMADOL HCL 50 MG PO TABS
50.0000 mg | ORAL_TABLET | Freq: Once | ORAL | Status: AC
  Administered 2015-05-13: 50 mg via ORAL
  Filled 2015-05-13: qty 1

## 2015-05-13 MED ORDER — METHOCARBAMOL 500 MG PO TABS: 500.0000 mg | ORAL_TABLET | Freq: Two times a day (BID) | ORAL | Status: DC

## 2015-05-13 MED ORDER — ONDANSETRON 4 MG PO TBDP
4.0000 mg | ORAL_TABLET | Freq: Once | ORAL | Status: AC
  Administered 2015-05-13: 4 mg via ORAL
  Filled 2015-05-13: qty 1

## 2015-05-13 MED ORDER — OXYCODONE-ACETAMINOPHEN 5-325 MG PO TABS
2.0000 | ORAL_TABLET | Freq: Once | ORAL | Status: AC
  Administered 2015-05-13: 2 via ORAL
  Filled 2015-05-13: qty 2
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
## 2015-05-13 NOTE — ED Notes (Signed)
She remains in no distress and states her nausea is better.  She has not vomited so far this E.D. Visit.
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
## 2015-05-13 NOTE — ED Notes (Signed)
Pt refusing blood draw. Pt sts her right arm is swollen and the left is bruised.  Does not wish to have it drawn from her hand.
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
## 2015-05-13 NOTE — ED Notes (Signed)
Triage level changed to 3 per request of PA and due to patient's additional orders.

## 2015-05-13 NOTE — ED Notes (Signed)
Patient reports she is nauseated.  PA notified.

## 2015-05-15 ENCOUNTER — Emergency Department (HOSPITAL_COMMUNITY): Payer: No Typology Code available for payment source

## 2015-05-15 ENCOUNTER — Encounter (HOSPITAL_COMMUNITY): Payer: Self-pay

## 2015-05-15 ENCOUNTER — Emergency Department (HOSPITAL_COMMUNITY)
Admission: EM | Admit: 2015-05-15 | Discharge: 2015-05-15 | Disposition: A | Discharge status: Home / Self Care | Payer: No Typology Code available for payment source | Attending: Emergency Medicine | Admitting: Emergency Medicine

## 2015-05-15 DIAGNOSIS — R011 Cardiac murmur, unspecified: Secondary | ICD-10-CM | POA: Insufficient documentation

## 2015-05-15 DIAGNOSIS — S1091XA Abrasion of unspecified part of neck, initial encounter: Secondary | ICD-10-CM | POA: Diagnosis not present

## 2015-05-15 DIAGNOSIS — Z880 Allergy status to penicillin: Secondary | ICD-10-CM | POA: Insufficient documentation

## 2015-05-15 DIAGNOSIS — S20212A Contusion of left front wall of thorax, initial encounter: Secondary | ICD-10-CM | POA: Diagnosis not present

## 2015-05-15 DIAGNOSIS — Y9389 Activity, other specified: Secondary | ICD-10-CM | POA: Diagnosis not present

## 2015-05-15 DIAGNOSIS — J45909 Unspecified asthma, uncomplicated: Secondary | ICD-10-CM | POA: Insufficient documentation

## 2015-05-15 DIAGNOSIS — S20219A Contusion of unspecified front wall of thorax, initial encounter: Secondary | ICD-10-CM

## 2015-05-15 DIAGNOSIS — Y92410 Unspecified street and highway as the place of occurrence of the external cause: Secondary | ICD-10-CM | POA: Insufficient documentation

## 2015-05-15 DIAGNOSIS — Z91040 Latex allergy status: Secondary | ICD-10-CM | POA: Diagnosis not present

## 2015-05-15 DIAGNOSIS — Z8679 Personal history of other diseases of the circulatory system: Secondary | ICD-10-CM | POA: Insufficient documentation

## 2015-05-15 DIAGNOSIS — E669 Obesity, unspecified: Secondary | ICD-10-CM | POA: Insufficient documentation

## 2015-05-15 DIAGNOSIS — Y998 Other external cause status: Secondary | ICD-10-CM | POA: Insufficient documentation

## 2015-05-15 DIAGNOSIS — S20211A Contusion of right front wall of thorax, initial encounter: Secondary | ICD-10-CM | POA: Diagnosis not present

## 2015-05-15 DIAGNOSIS — S29001A Unspecified injury of muscle and tendon of front wall of thorax, initial encounter: Secondary | ICD-10-CM | POA: Diagnosis present

## 2015-05-15 DIAGNOSIS — Z79899 Other long term (current) drug therapy: Secondary | ICD-10-CM | POA: Diagnosis not present

## 2015-05-15 DIAGNOSIS — E119 Type 2 diabetes mellitus without complications: Secondary | ICD-10-CM | POA: Insufficient documentation

## 2015-05-15 DIAGNOSIS — S0081XA Abrasion of other part of head, initial encounter: Secondary | ICD-10-CM | POA: Insufficient documentation

## 2015-05-15 DIAGNOSIS — Z9889 Other specified postprocedural states: Secondary | ICD-10-CM | POA: Diagnosis not present

## 2015-05-15 DIAGNOSIS — Q210 Ventricular septal defect: Secondary | ICD-10-CM | POA: Insufficient documentation

## 2015-05-15 MED ORDER — MUPIROCIN CALCIUM 2 % EX CREA: 1.0000 "application " | TOPICAL_CREAM | Freq: Two times a day (BID) | CUTANEOUS | Status: DC

## 2015-05-15 NOTE — ED Notes (Signed)
Bed: WA28 Expected date:  Expected time:  Means of arrival:  Comments: 
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
## 2015-05-15 NOTE — Discharge Instructions (Signed)
Abrasion An abrasion is a cut or scrape on the outer surface of your skin. An abrasion does not extend through all of the layers of your skin. It is important to care for your abrasion properly to prevent infection. CAUSES Most abrasions are caused by falling on or gliding across the ground or another surface. When your skin rubs on something, the outer and inner layer of skin rubs off.  SYMPTOMS A cut or scrape is the main symptom of this condition. The scrape may be bleeding, or it may appear red or pink. If there was an associated fall, there may be an underlying bruise. DIAGNOSIS An abrasion is diagnosed with a physical exam. TREATMENT Treatment for this condition depends on how large and deep the abrasion is. Usually, your abrasion will be cleaned with water and mild soap. This removes any dirt or debris that may be stuck. An antibiotic ointment may be applied to the abrasion to help prevent infection. A bandage (dressing) may be placed on the abrasion to keep it clean. You may also need a tetanus shot. HOME CARE INSTRUCTIONS Medicines  Take or apply medicines only as directed by your health care provider.  If you were prescribed an antibiotic ointment, finish all of it even if you start to feel better. Wound Care  Clean the wound with mild soap and water 2-3 times per day or as directed by your health care provider. Pat your wound dry with a clean towel. Do not rub it.  There are many different ways to close and cover a wound. Follow instructions from your health care provider about:  Wound care.  Dressing changes and removal.  Check your wound every day for signs of infection. Watch for:  Redness, swelling, or pain.  Fluid, blood, or pus. General Instructions  Keep the dressing dry as directed by your health care provider. Do not take baths, swim, use a hot tub, or do anything that would put your wound underwater until your health care provider approves.  If there is  swelling, raise (elevate) the injured area above the level of your heart while you are sitting or lying down.  Keep all follow-up visits as directed by your health care provider. This is important. SEEK MEDICAL CARE IF:  You received a tetanus shot and you have swelling, severe pain, redness, or bleeding at the injection site.  Your pain is not controlled with medicine.  You have increased redness, swelling, or pain at the site of your wound. SEEK IMMEDIATE MEDICAL CARE IF:  You have a red streak going away from your wound.  You have a fever.  You have fluid, blood, or pus coming from your wound.  You notice a bad smell coming from your wound or your dressing.   This information is not intended to replace advice given to you by your health care provider. Make sure you discuss any questions you have with your health care provider.   Document Released: 03/23/2005 Document Revised: 03/04/2015 Document Reviewed: 06/11/2014 Elsevier Interactive Patient Education 2016 [employer].  Chest Contusion A contusion is a deep bruise. Bruises happen when an injury causes bleeding under the skin. Signs of bruising include pain, puffiness (swelling), and discolored skin. The bruise may turn blue, purple, or yellow.  HOME CARE  Put ice on the injured area.  Put ice in a plastic bag.  Place a towel between the skin and the bag.  Leave the ice on for 15-20 minutes at a time, 03-04 times a  day for the first 48 hours.  Only take medicine as told by your doctor.  Rest.  Take deep breaths (deep-breathing exercises) as told by your doctor.  Stop smoking if you smoke.  Do not lift objects over 5 pounds (2.3 kilograms) for 3 days or longer if told by your doctor. GET HELP RIGHT AWAY IF:   You have more bruising or puffiness.  You have pain that gets worse.  You have trouble breathing.  You are dizzy, weak, or pass out (faint).  You have blood in your pee (urine) or poop  (stool).  You cough up or throw up (vomit) blood.  Your puffiness or pain is not helped with medicines. MAKE SURE YOU:   Understand these instructions.  Will watch your condition.  Will get help right away if you are not doing well or get worse.   This information is not intended to replace advice given to you by your health care provider. Make sure you discuss any questions you have with your health care provider.   Document Released: 11/30/2007 Document Revised: 03/07/2012 Document Reviewed: 12/05/2011 Elsevier Interactive Patient Education [employer].
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
## 2015-06-30 DIAGNOSIS — Z736 Limitation of activities due to disability: Secondary | ICD-10-CM | POA: Diagnosis not present

## 2015-07-09 ENCOUNTER — Encounter: Payer: Medicaid Other | Attending: Cardiology

## 2015-07-09 VITALS — Ht 59.0 in | Wt 164.4 lb

## 2015-07-09 DIAGNOSIS — Z713 Dietary counseling and surveillance: Secondary | ICD-10-CM | POA: Insufficient documentation

## 2015-07-09 DIAGNOSIS — E119 Type 2 diabetes mellitus without complications: Secondary | ICD-10-CM | POA: Insufficient documentation

## 2015-07-09 NOTE — Progress Notes (Signed)

## 2015-07-13 ENCOUNTER — Emergency Department (HOSPITAL_COMMUNITY)
Admission: EM | Admit: 2015-07-13 | Discharge: 2015-07-13 | Disposition: A | Discharge status: Home / Self Care | Payer: Medicaid Other | Attending: Emergency Medicine | Admitting: Emergency Medicine

## 2015-07-13 ENCOUNTER — Encounter (HOSPITAL_COMMUNITY): Payer: Self-pay | Admitting: Emergency Medicine

## 2015-07-13 DIAGNOSIS — Z79899 Other long term (current) drug therapy: Secondary | ICD-10-CM | POA: Diagnosis not present

## 2015-07-13 DIAGNOSIS — Z880 Allergy status to penicillin: Secondary | ICD-10-CM | POA: Insufficient documentation

## 2015-07-13 DIAGNOSIS — R011 Cardiac murmur, unspecified: Secondary | ICD-10-CM | POA: Diagnosis not present

## 2015-07-13 DIAGNOSIS — Z7984 Long term (current) use of oral hypoglycemic drugs: Secondary | ICD-10-CM | POA: Diagnosis not present

## 2015-07-13 DIAGNOSIS — Z792 Long term (current) use of antibiotics: Secondary | ICD-10-CM | POA: Insufficient documentation

## 2015-07-13 DIAGNOSIS — E669 Obesity, unspecified: Secondary | ICD-10-CM | POA: Insufficient documentation

## 2015-07-13 DIAGNOSIS — Z8679 Personal history of other diseases of the circulatory system: Secondary | ICD-10-CM | POA: Diagnosis not present

## 2015-07-13 DIAGNOSIS — M25511 Pain in right shoulder: Secondary | ICD-10-CM | POA: Diagnosis not present

## 2015-07-13 DIAGNOSIS — E119 Type 2 diabetes mellitus without complications: Secondary | ICD-10-CM | POA: Diagnosis not present

## 2015-07-13 DIAGNOSIS — J45909 Unspecified asthma, uncomplicated: Secondary | ICD-10-CM | POA: Insufficient documentation

## 2015-07-13 DIAGNOSIS — Z91040 Latex allergy status: Secondary | ICD-10-CM | POA: Insufficient documentation

## 2015-07-13 DIAGNOSIS — Q210 Ventricular septal defect: Secondary | ICD-10-CM | POA: Insufficient documentation

## 2015-07-13 DIAGNOSIS — Z9889 Other specified postprocedural states: Secondary | ICD-10-CM | POA: Insufficient documentation

## 2015-07-13 MED ORDER — DIAZEPAM 5 MG PO TABS
5.0000 mg | ORAL_TABLET | Freq: Once | ORAL | Status: DC
Start: 2015-07-13 — End: 2015-07-13
  Filled 2015-07-13: qty 1

## 2015-07-13 MED ORDER — IBUPROFEN 200 MG PO TABS
600.0000 mg | ORAL_TABLET | Freq: Once | ORAL | Status: AC
  Administered 2015-07-13: 600 mg via ORAL
  Filled 2015-07-13: qty 3

## 2015-07-13 MED ORDER — METHOCARBAMOL 750 MG PO TABS: 750.0000 mg | ORAL_TABLET | Freq: Four times a day (QID) | ORAL | Status: DC

## 2015-07-13 MED ORDER — IBUPROFEN 600 MG PO TABS: 600.0000 mg | ORAL_TABLET | Freq: Four times a day (QID) | ORAL | Status: DC | PRN

## 2015-07-13 NOTE — Discharge Instructions (Signed)
Heat Therapy  Heat therapy can help ease sore, stiff, injured, and tight muscles and joints. Heat relaxes your muscles, which may help ease your pain.   RISKS AND COMPLICATIONS  If you have any of the following conditions, do not use heat therapy unless your health care provider has approved:   Poor circulation.   Healing wounds or scarred skin in the area being treated.   Diabetes, heart disease, or high blood pressure.   Not being able to feel (numbness) the area being treated.   Unusual swelling of the area being treated.   Active infections.   Blood clots.   Cancer.   Inability to communicate pain. This may include young children and people who have problems with their brain function (dementia).   Pregnancy.  Heat therapy should only be used on old, pre-existing, or long-lasting (chronic) injuries. Do not use heat therapy on new injuries unless directed by your health care provider.  HOW TO USE HEAT THERAPY  There are several different kinds of heat therapy, including:   Moist heat pack.   Warm water bath.   Hot water bottle.   Electric heating pad.   Heated gel pack.   Heated wrap.   Electric heating pad.  Use the heat therapy method suggested by your health care provider. Follow your health care provider's instructions on when and how to use heat therapy.  GENERAL HEAT THERAPY RECOMMENDATIONS   Do not sleep while using heat therapy. Only use heat therapy while you are awake.   Your skin may turn pink while using heat therapy. Do not use heat therapy if your skin turns red.   Do not use heat therapy if you have new pain.   High heat or long exposure to heat can cause burns. Be careful when using heat therapy to avoid burning your skin.   Do not use heat therapy on areas of your skin that are already irritated, such as with a rash or sunburn.  SEEK MEDICAL CARE IF:   You have blisters, redness, swelling, or numbness.   You have new pain.   Your pain is worse.  MAKE SURE  YOU:   Understand these instructions.   Will watch your condition.   Will get help right away if you are not doing well or get worse.     This information is not intended to replace advice given to you by your health care provider. Make sure you discuss any questions you have with your health care provider.     Document Released: 09/05/2011 Document Revised: 07/04/2014 Document Reviewed: 08/06/2013  Elsevier Interactive Patient Education 2016 Elsevier Inc.
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
## 2015-07-13 NOTE — ED Provider Notes (Signed)
CSN: [id number]     Arrival date & time 07/13/15  0709 History   First MD Initiated Contact with Patient 07/13/15 0759     Chief Complaint  Patient presents with  . Shoulder Pain     (Consider location/radiation/quality/duration/timing/severity/associated sxs/prior Treatment) HPI Comments: Patient here complaining of right axilla pain 2 days. No fever or chills. No swelling to the area. Pain characterized as sharp and worse with movement. Denies any history of trauma. Not radiating down her arm. No prior history of same. Pain better with rest. No treatment use prior to arrival  Patient is a 45 y.o. female presenting with shoulder pain. The history is provided by the patient.  Shoulder Pain   Past Medical History  Diagnosis Date  . Obesity   . Heart murmur   . Congenital ventricular septal defect   . Asthma   . Diabetes mellitus without complication ([place])   . VSD (ventricular septal defect), perimembranous     Qp:Qs 1.7:1 by catheterization   . Pulmonary hypertension ([facility])     mild   . Tricuspid regurgitation    Past Surgical History  Procedure Laterality Date  . Tubal ligation    . Cyst excision    . Left and right heart catheterization with coronary angiogram [name] 09/11/2013    Procedure: LEFT AND RIGHT HEART CATHETERIZATION WITH CORONARY ANGIOGRAM;  Surgeon: [name], MD;  Location: [facility] CATH LAB;  Service: Cardiovascular;  Laterality: [name];  . Cardiac catheterization [name] 10/30/2014    Procedure: Right Heart Cath;  Surgeon: [name], MD;  Location: [facility] INVASIVE CV LAB CUPID;  Service: Cardiovascular;  Laterality: [name];   Family History  Problem Relation Age of Onset  . Cancer Other   . Hyperlipidemia Other   . Hypertension Other   . Asthma Other   . Diabetes Mother   . Diabetes Father    Social History  Substance Use Topics  . Smoking status: Never Smoker   . Smokeless tobacco: Never Used  . Alcohol Use: No   OB History    No data available     Review  of Systems  All other systems reviewed and are negative.     Allergies  Penicillins; Tape; Other; and Latex  Home Medications   Prior to Admission medications   Medication Sig Start Date End Date Taking? Authorizing Provider  Alogliptin-Metformin HCl (KAZANO) 12.10-998 MG TABS Take 1 tablet by mouth daily.     Historical Provider, MD  amLODipine (NORVASC) 5 MG tablet Take 1 tablet by mouth daily. 08/19/14   Historical Provider, MD  doxycycline (VIBRAMYCIN) 100 MG capsule Take 1 capsule (100 mg total) by mouth 2 (two) times daily. Patient not taking: Reported on 05/13/2015 04/17/15   Dalia Heading, PA-C  HYDROcodone-acetaminophen [facility]) 5-325 MG tablet Take 2 tablets by mouth every 4 (four) hours as needed. 05/13/15   Comer Locket, PA-C  hydrOXYzine (ATARAX/VISTARIL) 25 MG tablet Take 1 tablet (25 mg total) by mouth every 6 (six) hours. Patient not taking: Reported on 05/13/2015 04/17/15   Dalia Heading, PA-C  ibuprofen (ADVIL,MOTRIN) 600 MG tablet Take 1 tablet (600 mg total) by mouth every 6 (six) hours as needed. 05/13/15   Comer Locket, PA-C  methocarbamol (ROBAXIN) 500 MG tablet Take 1 tablet (500 mg total) by mouth 2 (two) times daily. 05/13/15   Comer Locket, PA-C  mupirocin cream (BACTROBAN) 2 % Apply 1 application topically 2 (two) times daily. 05/15/15   [name], PA-C  traMADol Veatrice Bourbon)  50 MG tablet Take 1 tablet (50 mg total) by mouth every 6 (six) hours as needed. 11/08/14   Glendell Docker, NP   BP 130/76 mmHg  Pulse 85  Temp(Src) 98.4 F (36.9 C) (Oral)  SpO2 100%  LMP 07/02/2015 Physical Exam  Constitutional: She is oriented to person, place, and time. She appears well-developed and well-nourished.  Non-toxic appearance. No distress.  HENT:  Head: Normocephalic and atraumatic.  Eyes: Conjunctivae, EOM and lids are normal. Pupils are equal, round, and reactive to light.  Neck: Normal range of motion. Neck supple. No tracheal deviation  present. No thyroid mass present.  Cardiovascular: Normal rate, regular rhythm and normal heart sounds.  Exam reveals no gallop.   No murmur heard. Pulmonary/Chest: Effort normal and breath sounds normal. No stridor. No respiratory distress. She has no decreased breath sounds. She has no wheezes. She has no rhonchi. She has no rales.  Abdominal: Soft. Normal appearance and bowel sounds are normal. She exhibits no distension. There is no tenderness. There is no rebound and no CVA tenderness.  Musculoskeletal: Normal range of motion. She exhibits no edema or tenderness.       Arms: Neurological: She is alert and oriented to person, place, and time. She has normal strength. No cranial nerve deficit or sensory deficit. GCS eye subscore is 4. GCS verbal subscore is 5. GCS motor subscore is 6.  Skin: Skin is warm and dry. No abrasion and no rash noted.  Psychiatric: She has a normal mood and affect. Her speech is normal and behavior is normal.  Nursing note and vitals reviewed.   ED Course  Procedures (including critical care time) Labs Review Labs Reviewed - No data to display  Imaging Review No results found. I have personally reviewed and evaluated these images and lab results as part of my medical decision-making.   EKG Interpretation None      MDM   Final diagnoses:  None    No evidence of infectious time. Patient be treated for musculoskeletal pain    [name], MD 07/13/15 [id number]
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
## 2015-07-16 ENCOUNTER — Other Ambulatory Visit: Payer: Self-pay | Admitting: Cardiology

## 2015-07-16 ENCOUNTER — Ambulatory Visit
Admission: RE | Admit: 2015-07-16 | Discharge: 2015-07-16 | Disposition: A | Discharge status: Home / Self Care | Payer: Medicaid Other | Source: Ambulatory Visit | Attending: Cardiology | Admitting: Cardiology

## 2015-07-16 DIAGNOSIS — E119 Type 2 diabetes mellitus without complications: Secondary | ICD-10-CM

## 2015-07-16 DIAGNOSIS — M25511 Pain in right shoulder: Secondary | ICD-10-CM

## 2015-07-23 DIAGNOSIS — E119 Type 2 diabetes mellitus without complications: Secondary | ICD-10-CM | POA: Diagnosis not present

## 2015-07-23 NOTE — Progress Notes (Signed)

## 2015-07-23 NOTE — Progress Notes (Signed)
Patient was seen on 07/23/15 for the third of a series of three diabetes self-management courses at the [facility].   [place]ina [place] the amount of activity recommended for healthy living . Describe activities suitable for individual needs . Identify ways to regularly incorporate activity into daily life . Identify barriers to activity and ways to over come these barriers  Identify diabetes medications being personally used and their primary action for lowering glucose and possible side effects . Describe role of stress on blood glucose and develop strategies to address psychosocial issues . Identify diabetes complications and ways to prevent them  Explain how to manage diabetes during illness . Evaluate success in meeting personal goal . Establish 2-3 goals that they will plan to diligently work on until they return for the  60-month follow-up visit  Goals:   I will count my carb choices at most meals and snacks  I will be active 30 minutes or more 3 times a week  I will look at patterns in my record book at least 2 days a month  Your patient has identified these potential barriers to change:  Stress  Your patient has identified their diabetes self-care support plan as  On-line Resources  Plan:  Attend Optional Core 4 in 4 months

## 2015-07-30 ENCOUNTER — Encounter (HOSPITAL_COMMUNITY): Payer: Self-pay | Admitting: Emergency Medicine

## 2015-07-30 ENCOUNTER — Emergency Department (HOSPITAL_COMMUNITY)
Admission: EM | Admit: 2015-07-30 | Discharge: 2015-07-30 | Disposition: A | Discharge status: Home / Self Care | Payer: Medicaid Other | Attending: Emergency Medicine | Admitting: Emergency Medicine

## 2015-07-30 DIAGNOSIS — Z79899 Other long term (current) drug therapy: Secondary | ICD-10-CM | POA: Diagnosis not present

## 2015-07-30 DIAGNOSIS — Q210 Ventricular septal defect: Secondary | ICD-10-CM | POA: Insufficient documentation

## 2015-07-30 DIAGNOSIS — M542 Cervicalgia: Secondary | ICD-10-CM | POA: Diagnosis not present

## 2015-07-30 DIAGNOSIS — I10 Essential (primary) hypertension: Secondary | ICD-10-CM | POA: Insufficient documentation

## 2015-07-30 DIAGNOSIS — G44209 Tension-type headache, unspecified, not intractable: Secondary | ICD-10-CM | POA: Diagnosis not present

## 2015-07-30 DIAGNOSIS — E119 Type 2 diabetes mellitus without complications: Secondary | ICD-10-CM | POA: Diagnosis not present

## 2015-07-30 DIAGNOSIS — Z9889 Other specified postprocedural states: Secondary | ICD-10-CM | POA: Insufficient documentation

## 2015-07-30 DIAGNOSIS — E669 Obesity, unspecified: Secondary | ICD-10-CM | POA: Insufficient documentation

## 2015-07-30 DIAGNOSIS — Z91040 Latex allergy status: Secondary | ICD-10-CM | POA: Diagnosis not present

## 2015-07-30 DIAGNOSIS — R011 Cardiac murmur, unspecified: Secondary | ICD-10-CM | POA: Insufficient documentation

## 2015-07-30 DIAGNOSIS — J45909 Unspecified asthma, uncomplicated: Secondary | ICD-10-CM | POA: Diagnosis not present

## 2015-07-30 DIAGNOSIS — R51 Headache: Secondary | ICD-10-CM | POA: Diagnosis present

## 2015-07-30 MED ORDER — IBUPROFEN 800 MG PO TABS
800.0000 mg | ORAL_TABLET | Freq: Once | ORAL | Status: AC
  Administered 2015-07-30: 800 mg via ORAL
  Filled 2015-07-30: qty 1

## 2015-07-30 MED ORDER — DIPHENHYDRAMINE HCL 25 MG PO CAPS
25.0000 mg | ORAL_CAPSULE | Freq: Once | ORAL | Status: AC
  Administered 2015-07-30: 25 mg via ORAL
  Filled 2015-07-30: qty 1

## 2015-07-30 MED ORDER — METOCLOPRAMIDE HCL 10 MG PO TABS: 10.0000 mg | ORAL_TABLET | Freq: Three times a day (TID) | ORAL | Status: DC | PRN

## 2015-07-30 MED ORDER — METOCLOPRAMIDE HCL 10 MG PO TABS
10.0000 mg | ORAL_TABLET | Freq: Once | ORAL | Status: AC
  Administered 2015-07-30: 10 mg via ORAL
  Filled 2015-07-30: qty 1

## 2015-07-30 MED ORDER — IBUPROFEN 800 MG PO TABS: 800.0000 mg | ORAL_TABLET | Freq: Three times a day (TID) | ORAL | Status: DC

## 2015-07-30 NOTE — ED Notes (Signed)
Patient given graham crackers and ice chips

## 2015-07-30 NOTE — ED Notes (Addendum)
Patient presents for HA x4 days and "knot" to right lateral neck, soft tissue swelling, tender upon palpation. Denies visual changes, fever/chills, difficulty swallowing. Has not taken BP medications in x3days.  Patient on facetime during entire triage process.

## 2015-07-30 NOTE — Discharge Instructions (Signed)
Tension Headache A tension headache is a feeling of pain, pressure, or aching that is often felt over the front and sides of the head. The pain can be dull, or it can feel tight (constricting). Tension headaches are not normally associated with nausea or vomiting, and they do not get worse with physical activity. Tension headaches can last from 30 minutes to several days. This is the most common type of headache. CAUSES The exact cause of this condition is not known. Tension headaches often begin after stress, anxiety, or depression. Other triggers may include:  Alcohol.  Too much caffeine, or caffeine withdrawal.  Respiratory infections, such as colds, flu, or sinus infections.  Dental problems or teeth clenching.  Fatigue.  Holding your head and neck in the same position for a long period of time, such as while using a computer.  Smoking. SYMPTOMS Symptoms of this condition include:  A feeling of pressure around the head.  Dull, aching head pain.  Pain felt over the front and sides of the head.  Tenderness in the muscles of the head, neck, and shoulders. DIAGNOSIS This condition may be diagnosed based on your symptoms and a physical exam. Tests may be done, such as a CT scan or an MRI of your head. These tests may be done if your symptoms are severe or unusual. TREATMENT This condition may be treated with lifestyle changes and medicines to help relieve symptoms. HOME CARE INSTRUCTIONS Managing Pain  Take over-the-counter and prescription medicines only as told by your health care provider.  Lie down in a dark, quiet room when you have a headache.  If directed, apply ice to the head and neck area:  Put ice in a plastic bag.  Place a towel between your skin and the bag.  Leave the ice on for 20 minutes, 2-3 times per day.  Use a heating pad or a hot shower to apply heat to the head and neck area as told by your health care provider. Eating and Drinking  Eat meals on  a regular schedule.  Limit alcohol use.  Decrease your caffeine intake, or stop using caffeine. General Instructions  Keep all follow-up visits as told by your health care provider. This is important.  Keep a headache journal to help find out what may trigger your headaches. For example, write down:  What you eat and drink.  How much sleep you get.  Any change to your diet or medicines.  Try massage or other relaxation techniques.  Limit stress.  Sit up straight, and avoid tensing your muscles.  Do not use tobacco products, including cigarettes, chewing tobacco, or e-cigarettes. If you need help quitting, ask your health care provider.  Exercise regularly as told by your health care provider.  Get 7-9 hours of sleep, or the amount recommended by your health care provider. SEEK MEDICAL CARE IF:  Your symptoms are not helped by medicine.  You have a headache that is different from what you normally experience.  You have nausea or you vomit.  You have a fever. SEEK IMMEDIATE MEDICAL CARE IF:  Your headache becomes severe.  You have repeated vomiting.  You have a stiff neck.  You have a loss of vision.  You have problems with speech.  You have pain in your eye or ear.  You have muscular weakness or loss of muscle control.  You lose your balance or you have trouble walking.  You feel faint or you pass out.  You have confusion.     This information is not intended to replace advice given to you by your health care provider. Make sure you discuss any questions you have with your health care provider.   Document Released: 06/13/2005 Document Revised: 03/04/2015 Document Reviewed: 10/06/2014 Elsevier Interactive Patient Education 2016 Elsevier Inc.  

## 2015-07-30 NOTE — ED Notes (Signed)
Discharge instructions, follow up care, and rx x2 reviewed with patient. Patient verbalized understanding. 

## 2015-07-30 NOTE — ED Notes (Signed)
Patient stating that she feels much better and is wanting to go home. Santiago Glad, [place], made aware.

## 2015-07-30 NOTE — Progress Notes (Signed)
Patient noted to have been seen in the ED 7 times within the last six months.  Per chart review, patient has not taken her blood pressure medications in three days.  Patient listed as having Medicaid [place] [employer] insurance.  Patient's medications are three dollars or less.  Patient's pcp listed on her Medicaid card Dr. Montez Morita.

## 2015-07-30 NOTE — ED Provider Notes (Signed)
CSN: [id number]     Arrival date & time 07/30/15  1805 History   First MD Initiated Contact with Patient 07/30/15 2016     Chief Complaint  Patient presents with  . Headache     (Consider location/radiation/quality/duration/timing/severity/associated sxs/prior Treatment) Patient is a 45 y.o. female presenting with headaches. The history is provided by the patient. No language interpreter was used.  Headache Pain location:  R parietal Quality:  Unable to specify Radiates to:  Does not radiate Severity at highest:  8/10 Onset quality:  Gradual Timing:  Constant Progression:  Worsening Chronicity:  New Similar to prior headaches: yes   Relieved by:  Nothing Worsened by:  Nothing Ineffective treatments:  Aspirin Associated symptoms: neck pain     Past Medical History  Diagnosis Date  . Obesity   . Heart murmur   . Congenital ventricular septal defect   . Asthma   . Diabetes mellitus without complication ([place])   . VSD (ventricular septal defect), perimembranous     Qp:Qs 1.7:1 by catheterization   . Pulmonary hypertension (HCC)     mild   . Tricuspid regurgitation    Past Surgical History  Procedure Laterality Date  . Tubal ligation    . Cyst excision    . Left and right heart catheterization with coronary angiogram [name] 09/11/2013    Procedure: LEFT AND RIGHT HEART CATHETERIZATION WITH CORONARY ANGIOGRAM;  Surgeon: [name], MD;  Location: [facility] CATH LAB;  Service: Cardiovascular;  Laterality: [name];  . Cardiac catheterization [name] 10/30/2014    Procedure: Right Heart Cath;  Surgeon: [name], MD;  Location: [facility] INVASIVE CV LAB CUPID;  Service: Cardiovascular;  Laterality: [name];   Family History  Problem Relation Age of Onset  . Cancer Other   . Hyperlipidemia Other   . Hypertension Other   . Asthma Other   . Diabetes Mother   . Diabetes Father    Social History  Substance Use Topics  . Smoking status: Never Smoker   . Smokeless tobacco: Never Used  .  Alcohol Use: No   OB History    No data available     Review of Systems  Musculoskeletal: Positive for neck pain.  Neurological: Positive for headaches.  All other systems reviewed and are negative.     Allergies  Penicillins; Tape; Other; and Latex  Home Medications   Prior to Admission medications   Medication Sig Start Date End Date Taking? Authorizing Provider  Alogliptin-Metformin HCl (KAZANO) 12.10-998 MG TABS Take 1 tablet by mouth daily.    Yes Historical Provider, MD  amLODipine (NORVASC) 5 MG tablet Take 1 tablet by mouth daily. 08/19/14  Yes Historical Provider, MD  HYDROcodone-acetaminophen (NORCO) 5-325 MG tablet Take 2 tablets by mouth every 4 (four) hours as needed. 05/13/15  Yes Comer Locket, PA-C  traMADol (ULTRAM) 50 MG tablet Take 1 tablet (50 mg total) by mouth every 6 (six) hours as needed. 11/08/14  Yes Glendell Docker, NP  doxycycline (VIBRAMYCIN) 100 MG capsule Take 1 capsule (100 mg total) by mouth 2 (two) times daily. Patient not taking: Reported on 05/13/2015 04/17/15   Dalia Heading, PA-C  hydrOXYzine (ATARAX/VISTARIL) 25 MG tablet Take 1 tablet (25 mg total) by mouth every 6 (six) hours. Patient not taking: Reported on 05/13/2015 04/17/15   Dalia Heading, PA-C  ibuprofen (ADVIL,MOTRIN) 600 MG tablet Take 1 tablet (600 mg total) by mouth every 6 (six) hours as needed. 07/13/15   [name], MD  methocarbamol (ROBAXIN-750) 750  MG tablet Take 1 tablet (750 mg total) by mouth 4 (four) times daily. 07/13/15   [name], MD  mupirocin cream (BACTROBAN) 2 % Apply 1 application topically 2 (two) times daily. Patient not taking: Reported on 07/30/2015 05/15/15   [name], PA-C   BP 165/89 mmHg  Pulse 90  Resp 18  SpO2 100%  LMP 07/02/2015 Physical Exam  Constitutional: She is oriented to person, place, and time. She appears well-developed and well-nourished.  HENT:  Head: Normocephalic.  Right Ear: External ear normal.  Left Ear:  External ear normal.  Nose: Nose normal.  Mouth/Throat: Oropharynx is clear and moist.  Eyes: Conjunctivae and EOM are normal. Pupils are equal, round, and reactive to light.  Neck: Normal range of motion.  Cardiovascular: Normal rate.   Pulmonary/Chest: Effort normal.  Abdominal: She exhibits no distension.  Musculoskeletal: Normal range of motion.  Tender right anterior neck,  No lymph node,  Sore to touch,   Neurological: She is alert and oriented to person, place, and time.  Skin: Skin is warm.  Psychiatric: She has a normal mood and affect.  Nursing note and vitals reviewed.   ED Course  Procedures (including critical care time) Labs Review Labs Reviewed - No data to display  Imaging Review No results found. I have personally reviewed and evaluated these images and lab results as part of my medical decision-making.   EKG Interpretation None      MDM   Final diagnoses:  Tension-type headache, not intractable, unspecified chronicity pattern    Pt refused IV/IM medication.   Pt request oral medication only.   Pt given reglan, benadryl and ibuprofen.  Pt reports she feels better.   Rx for ibuprofen and [place], PA-C 07/30/15 [place], MD 07/30/15 [phone]

## 2015-08-26 IMAGING — CR DG HAND COMPLETE 3+V*L*
3 series · 3 of 3 positions shown · non-contrast
Comparison: None.

CLINICAL DATA: Right hand pain.  No known injury.

EXAM:
LEFT HAND - COMPLETE 3+ VIEW

[x hand pa left]
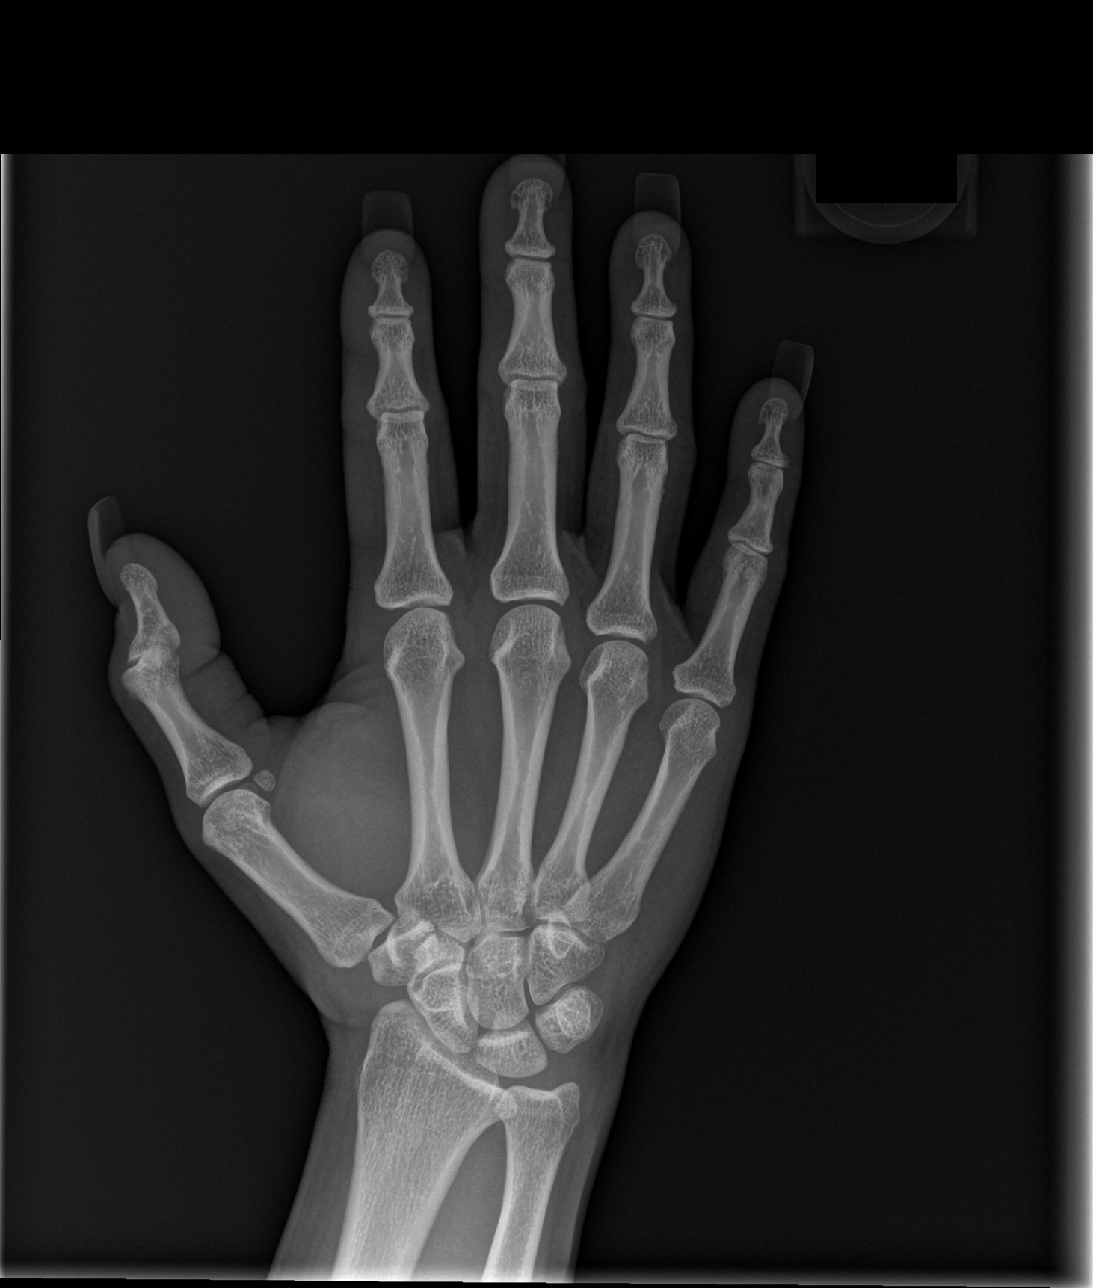

[x hand obl left]
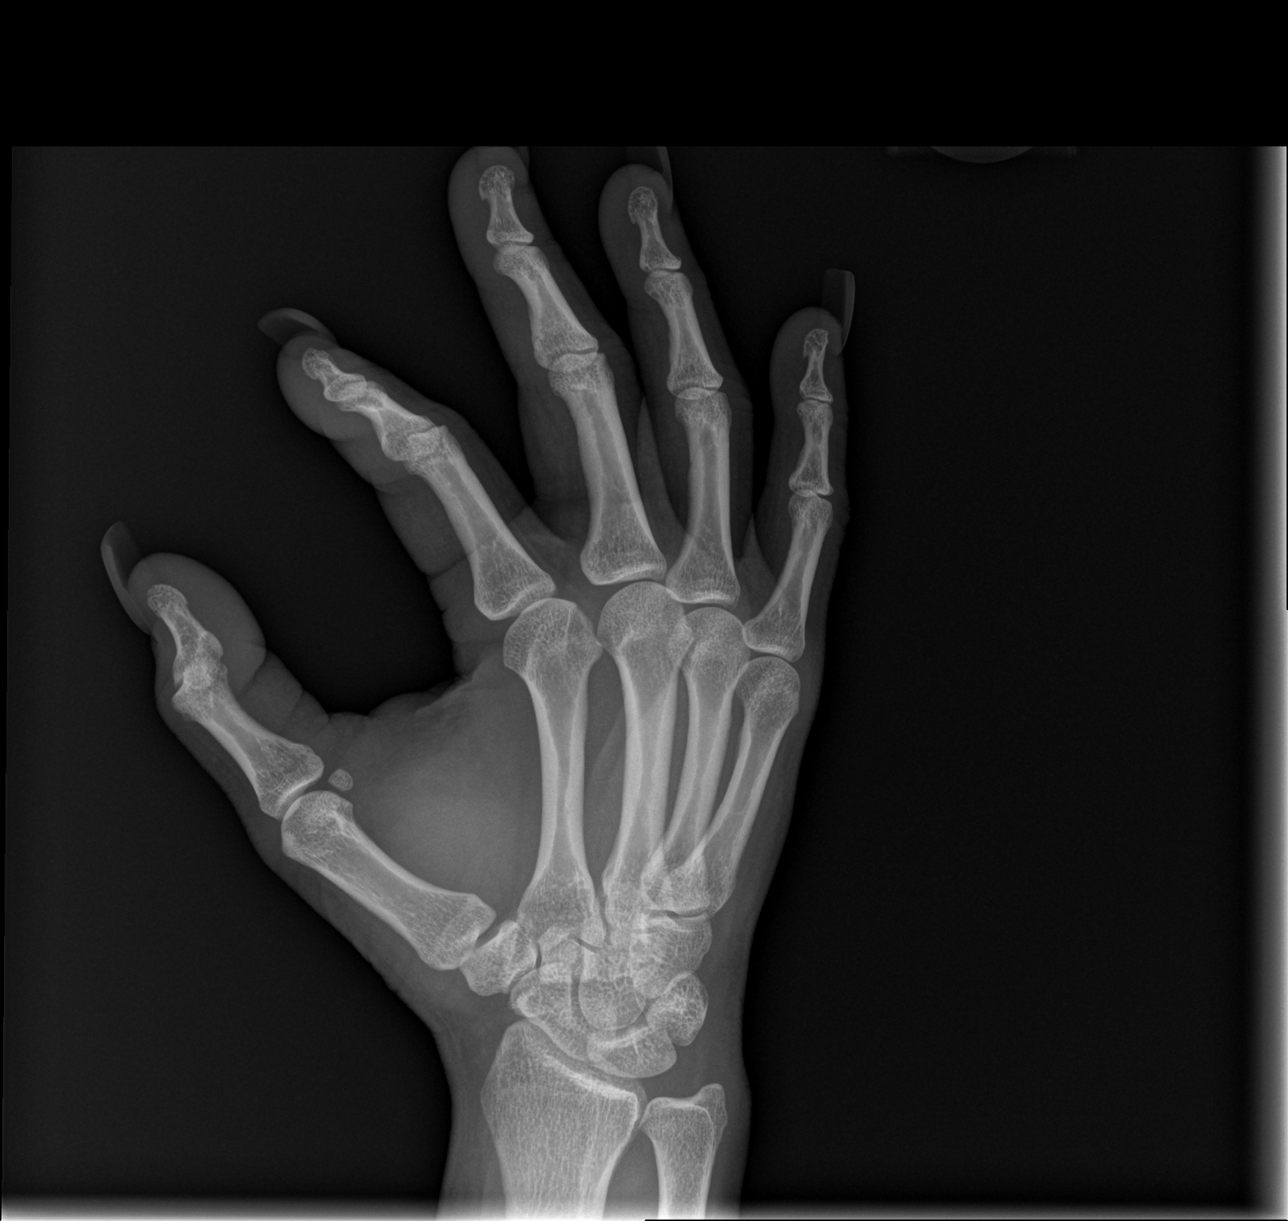

[x hand lat left]
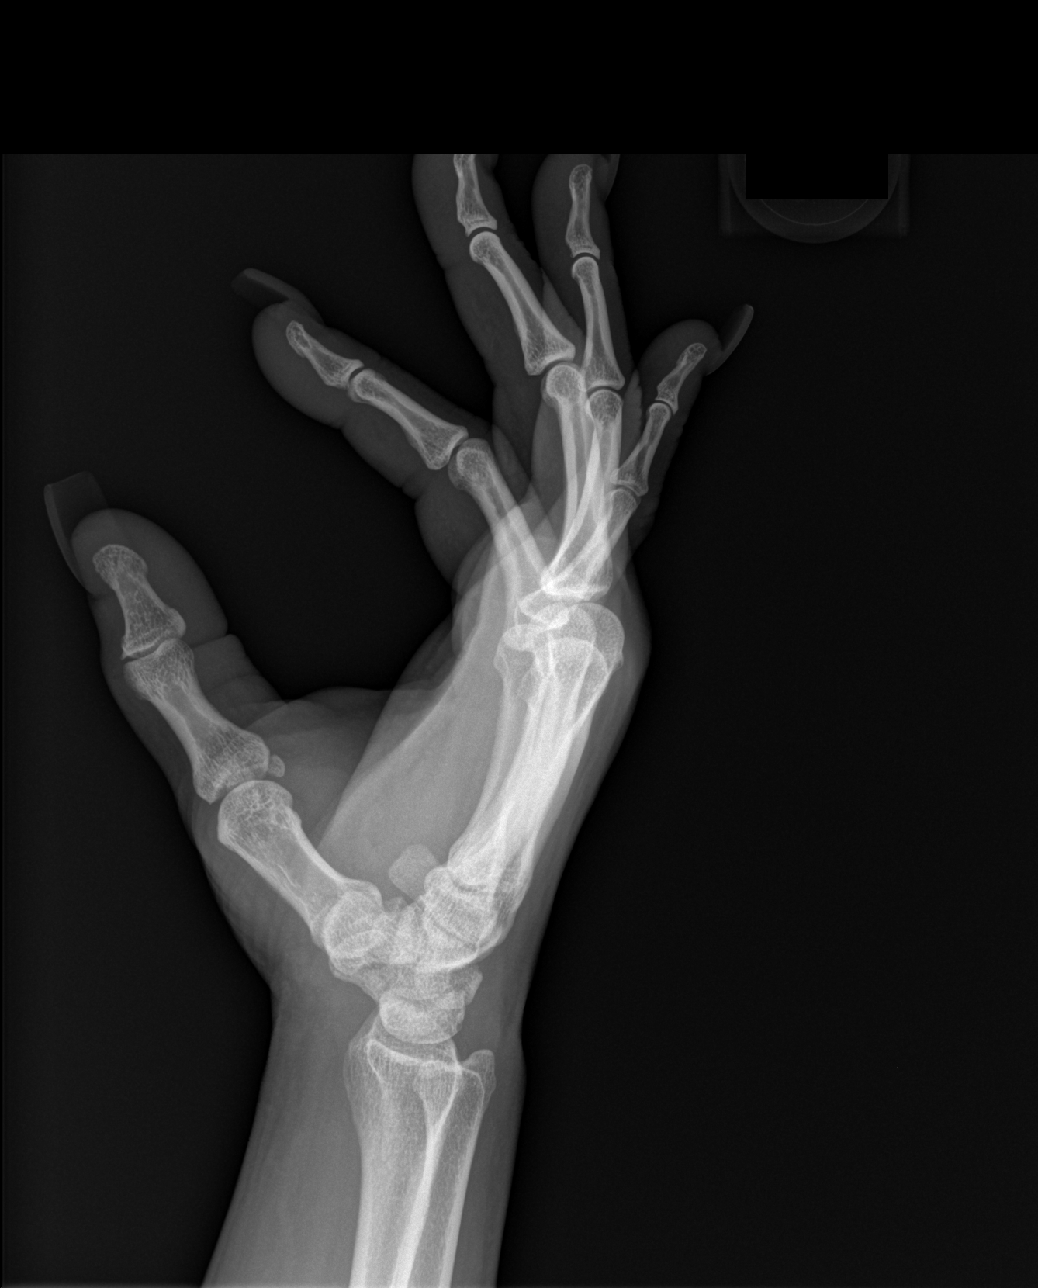

[3 of 3 positions shown; findings below may reference images not displayed]

FINDINGS: There is no evidence of fracture or dislocation. There is no
evidence of arthropathy or other focal bone abnormality. Soft
tissues are unremarkable.
IMPRESSION: Normal exam.

## 2015-09-27 ENCOUNTER — Emergency Department (HOSPITAL_COMMUNITY): Payer: Medicaid Other

## 2015-09-27 ENCOUNTER — Encounter (HOSPITAL_COMMUNITY): Payer: Self-pay | Admitting: Emergency Medicine

## 2015-09-27 ENCOUNTER — Emergency Department (HOSPITAL_COMMUNITY)
Admission: EM | Admit: 2015-09-27 | Discharge: 2015-09-27 | Disposition: A | Discharge status: Home / Self Care | Payer: Medicaid Other | Attending: Emergency Medicine | Admitting: Emergency Medicine

## 2015-09-27 DIAGNOSIS — Z79899 Other long term (current) drug therapy: Secondary | ICD-10-CM | POA: Insufficient documentation

## 2015-09-27 DIAGNOSIS — E119 Type 2 diabetes mellitus without complications: Secondary | ICD-10-CM | POA: Diagnosis not present

## 2015-09-27 DIAGNOSIS — Z880 Allergy status to penicillin: Secondary | ICD-10-CM | POA: Diagnosis not present

## 2015-09-27 DIAGNOSIS — Z91040 Latex allergy status: Secondary | ICD-10-CM | POA: Diagnosis not present

## 2015-09-27 DIAGNOSIS — K8020 Calculus of gallbladder without cholecystitis without obstruction: Secondary | ICD-10-CM

## 2015-09-27 DIAGNOSIS — Z9851 Tubal ligation status: Secondary | ICD-10-CM | POA: Diagnosis not present

## 2015-09-27 DIAGNOSIS — J45909 Unspecified asthma, uncomplicated: Secondary | ICD-10-CM | POA: Diagnosis not present

## 2015-09-27 DIAGNOSIS — Z9889 Other specified postprocedural states: Secondary | ICD-10-CM | POA: Insufficient documentation

## 2015-09-27 DIAGNOSIS — E669 Obesity, unspecified: Secondary | ICD-10-CM | POA: Diagnosis not present

## 2015-09-27 DIAGNOSIS — Z3202 Encounter for pregnancy test, result negative: Secondary | ICD-10-CM | POA: Diagnosis not present

## 2015-09-27 DIAGNOSIS — R011 Cardiac murmur, unspecified: Secondary | ICD-10-CM | POA: Insufficient documentation

## 2015-09-27 DIAGNOSIS — Q210 Ventricular septal defect: Secondary | ICD-10-CM | POA: Insufficient documentation

## 2015-09-27 DIAGNOSIS — R1013 Epigastric pain: Secondary | ICD-10-CM | POA: Diagnosis present

## 2015-09-27 LAB — CBC WITH DIFFERENTIAL/PLATELET
BASOS PCT: 0 %
Basophils Absolute: 0 10*3/uL (ref 0.0–0.1)
EOS ABS: 0.1 10*3/uL (ref 0.0–0.7)
EOS PCT: 2 %
HCT: 32.5 % — ABNORMAL LOW (ref 36.0–46.0)
Hemoglobin: 10.3 g/dL — ABNORMAL LOW (ref 12.0–15.0)
LYMPHS ABS: 1.5 10*3/uL (ref 0.7–4.0)
Lymphocytes Relative: 34 %
MCH: 22.6 pg — AB (ref 26.0–34.0)
MCHC: 31.7 g/dL (ref 30.0–36.0)
MCV: 71.4 fL — AB (ref 78.0–100.0)
Monocytes Absolute: 0.4 10*3/uL (ref 0.1–1.0)
Monocytes Relative: 10 %
NEUTROS PCT: 54 %
Neutro Abs: 2.4 10*3/uL (ref 1.7–7.7)
PLATELETS: 239 10*3/uL (ref 150–400)
RBC: 4.55 MIL/uL (ref 3.87–5.11)
RDW: 16.2 % — AB (ref 11.5–15.5)
WBC: 4.4 10*3/uL (ref 4.0–10.5)

## 2015-09-27 LAB — URINALYSIS, ROUTINE W REFLEX MICROSCOPIC
BILIRUBIN URINE: NEGATIVE
Glucose, UA: NEGATIVE mg/dL
HGB URINE DIPSTICK: NEGATIVE
Ketones, ur: NEGATIVE mg/dL
Leukocytes, UA: NEGATIVE
NITRITE: NEGATIVE
PH: 5.5 (ref 5.0–8.0)
Protein, ur: NEGATIVE mg/dL
SPECIFIC GRAVITY, URINE: 1.027 (ref 1.005–1.030)

## 2015-09-27 LAB — COMPREHENSIVE METABOLIC PANEL
ALK PHOS: 62 U/L (ref 38–126)
ALT: 10 U/L — AB (ref 14–54)
AST: 16 U/L (ref 15–41)
Albumin: 4 g/dL (ref 3.5–5.0)
Anion gap: 6 (ref 5–15)
BILIRUBIN TOTAL: 0.5 mg/dL (ref 0.3–1.2)
BUN: 10 mg/dL (ref 6–20)
CALCIUM: 8.9 mg/dL (ref 8.9–10.3)
CO2: 22 mmol/L (ref 22–32)
CREATININE: 0.72 mg/dL (ref 0.44–1.00)
Chloride: 109 mmol/L (ref 101–111)
Glucose, Bld: 145 mg/dL — ABNORMAL HIGH (ref 65–99)
Potassium: 3.8 mmol/L (ref 3.5–5.1)
SODIUM: 137 mmol/L (ref 135–145)
TOTAL PROTEIN: 7.3 g/dL (ref 6.5–8.1)

## 2015-09-27 LAB — LIPASE, BLOOD: LIPASE: 36 U/L (ref 11–51)

## 2015-09-27 LAB — POC URINE PREG, ED: Preg Test, Ur: NEGATIVE

## 2015-09-27 MED ORDER — GI COCKTAIL ~~LOC~~
30.0000 mL | Freq: Once | ORAL | Status: DC
  Filled 2015-09-27: qty 30

## 2015-09-27 MED ORDER — RANITIDINE HCL 150 MG PO CAPS: 150.0000 mg | ORAL_CAPSULE | Freq: Every day | ORAL | Status: DC

## 2015-09-27 NOTE — ED Provider Notes (Signed)
CSN: [id number]     Arrival date & time 09/27/15  1005 History   First MD Initiated Contact with Patient 09/27/15 1145     Chief Complaint  Patient presents with  . Abdominal Pain     (Consider location/radiation/quality/duration/timing/severity/associated sxs/prior Treatment) HPI Comments: Patient presents with abdominal pain. She has a history of diabetes, asthma, and congenital VSD with pulmonary hypertension. Her heart care is at [facility]. She states over last year she's had pain in her epigastrium, primarily after she eats meat. She states she has seen a gastroenterologist in the past who started her on an acid reducing medicine which she took about a week. She states it did not help her symptoms. She denies any nausea or vomiting. No fevers. She states she ate some fish last night and the pain worse and has continued until a morning. She's having normal bowel movements. She took Tylenol at home for the pain without improvement of symptoms.  Patient is a 45 y.o. female presenting with abdominal pain.  Abdominal Pain Associated symptoms: no chest pain, no chills, no cough, no diarrhea, no fatigue, no fever, no hematuria, no nausea, no shortness of breath and no vomiting     Past Medical History  Diagnosis Date  . Obesity   . Heart murmur   . Congenital ventricular septal defect   . Asthma   . Diabetes mellitus without complication ([place])   . VSD (ventricular septal defect), perimembranous     Qp:Qs 1.7:1 by catheterization   . Pulmonary hypertension (HCC)     mild   . Tricuspid regurgitation    Past Surgical History  Procedure Laterality Date  . Tubal ligation    . Cyst excision    . Left and right heart catheterization with coronary angiogram [name] 09/11/2013    Procedure: LEFT AND RIGHT HEART CATHETERIZATION WITH CORONARY ANGIOGRAM;  Surgeon: [name], MD;  Location: [facility] CATH LAB;  Service: Cardiovascular;  Laterality: [name];  . Cardiac catheterization [name]  10/30/2014    Procedure: Right Heart Cath;  Surgeon: [name], MD;  Location: [facility] INVASIVE CV LAB CUPID;  Service: Cardiovascular;  Laterality: [name];   Family History  Problem Relation Age of Onset  . Cancer Other   . Hyperlipidemia Other   . Hypertension Other   . Asthma Other   . Diabetes Mother   . Diabetes Father    Social History  Substance Use Topics  . Smoking status: Never Smoker   . Smokeless tobacco: Never Used  . Alcohol Use: No   OB History    No data available     Review of Systems  Constitutional: Negative for fever, chills, diaphoresis and fatigue.  HENT: Negative for congestion, rhinorrhea and sneezing.   Eyes: Negative.   Respiratory: Negative for cough, chest tightness and shortness of breath.   Cardiovascular: Negative for chest pain and leg swelling.  Gastrointestinal: Positive for abdominal pain. Negative for nausea, vomiting, diarrhea and blood in stool.  Genitourinary: Negative for frequency, hematuria, flank pain and difficulty urinating.  Musculoskeletal: Negative for back pain and arthralgias.  Skin: Negative for rash.  Neurological: Negative for dizziness, speech difficulty, weakness, numbness and headaches.      Allergies  Penicillins; Tape; Other; and Latex  Home Medications   Prior to Admission medications   Medication Sig Start Date End Date Taking? Authorizing Provider  Alogliptin-Metformin HCl (KAZANO) 12.10-998 MG TABS Take 1 tablet by mouth 2 (two) times daily.    Yes Historical Provider,  MD  amLODipine (NORVASC) 5 MG tablet Take 1 tablet by mouth every morning.  08/19/14  Yes Historical Provider, MD  doxycycline (VIBRAMYCIN) 100 MG capsule Take 1 capsule (100 mg total) by mouth 2 (two) times daily. Patient not taking: Reported on 05/13/2015 04/17/15   Dalia Heading, PA-C  hydrOXYzine (ATARAX/VISTARIL) 25 MG tablet Take 1 tablet (25 mg total) by mouth every 6 (six) hours. Patient not taking: Reported on 05/13/2015 04/17/15    Dalia Heading, PA-C  ibuprofen (ADVIL,MOTRIN) 800 MG tablet Take 1 tablet (800 mg total) by mouth 3 (three) times daily. Patient not taking: Reported on 09/27/2015 07/30/15   [name], PA-C  methocarbamol (ROBAXIN-750) 750 MG tablet Take 1 tablet (750 mg total) by mouth 4 (four) times daily. Patient not taking: Reported on 09/27/2015 07/13/15   [name], MD  metoCLOPramide (REGLAN) 10 MG tablet Take 1 tablet (10 mg total) by mouth every 8 (eight) hours as needed for nausea. Patient not taking: Reported on 09/27/2015 07/30/15   [name], PA-C  mupirocin cream (BACTROBAN) 2 % Apply 1 application topically 2 (two) times daily. Patient not taking: Reported on 07/30/2015 05/15/15   [name], PA-C  ranitidine (ZANTAC) 150 MG capsule Take 1 capsule (150 mg total) by mouth daily. 09/27/15   [name], MD   BP 144/95 mmHg  Pulse 80  Temp(Src) 98.2 F (36.8 C) (Oral)  Resp 16  Ht 4\' 11"  (1.499 m)  Wt 162 lb (73.483 kg)  BMI 32.70 kg/m2  SpO2 100%  LMP 08/28/2015 Physical Exam  Constitutional: She is oriented to person, place, and time. She appears well-developed and well-nourished.  HENT:  Head: Normocephalic and atraumatic.  Eyes: Pupils are equal, round, and reactive to light.  Neck: Normal range of motion. Neck supple.  Cardiovascular: Normal rate and regular rhythm.   Murmur heard. Pulmonary/Chest: Effort normal and breath sounds normal. No respiratory distress. She has no wheezes. She has no rales. She exhibits no tenderness.  Abdominal: Soft. Bowel sounds are normal. There is tenderness (Positive tenderness to the epigastrium). There is no rebound and no guarding.  Musculoskeletal: Normal range of motion. She exhibits no edema.  Lymphadenopathy:    She has no cervical adenopathy.  Neurological: She is alert and oriented to person, place, and time.  Skin: Skin is warm and dry. No rash noted.  Psychiatric: She has a normal mood and affect.    ED Course  Procedures  (including critical care time) Labs Review Results for orders placed or performed during the hospital encounter of 09/27/15  Urinalysis, Routine w reflex microscopic (not at [facility])  Result Value Ref Range   Color, Urine YELLOW YELLOW   APPearance CLOUDY (A) CLEAR   Specific Gravity, Urine 1.027 1.005 - 1.030   pH 5.5 5.0 - 8.0   Glucose, UA NEGATIVE NEGATIVE mg/dL   Hgb urine dipstick NEGATIVE NEGATIVE   Bilirubin Urine NEGATIVE NEGATIVE   Ketones, ur NEGATIVE NEGATIVE mg/dL   Protein, ur NEGATIVE NEGATIVE mg/dL   Nitrite NEGATIVE NEGATIVE   Leukocytes, UA NEGATIVE NEGATIVE  Comprehensive metabolic panel  Result Value Ref Range   Sodium 137 135 - 145 mmol/L   Potassium 3.8 3.5 - 5.1 mmol/L   Chloride 109 101 - 111 mmol/L   CO2 22 22 - 32 mmol/L   Glucose, Bld 145 (H) 65 - 99 mg/dL   BUN 10 6 - 20 mg/dL   Creatinine, Ser 0.72 0.44 - 1.00 mg/dL   Calcium 8.9 8.9 -  10.3 mg/dL   Total Protein 7.3 6.5 - 8.1 g/dL   Albumin 4.0 3.5 - 5.0 g/dL   AST 16 15 - 41 U/L   ALT 10 (L) 14 - 54 U/L   Alkaline Phosphatase 62 38 - 126 U/L   Total Bilirubin 0.5 0.3 - 1.2 mg/dL   GFR calc non Af Amer >60 >60 mL/min   GFR calc Af Amer >60 >60 mL/min   Anion gap 6 5 - 15  Lipase, blood  Result Value Ref Range   Lipase 36 11 - 51 U/L  CBC with Differential  Result Value Ref Range   WBC 4.4 4.0 - 10.5 K/uL   RBC 4.55 3.87 - 5.11 MIL/uL   Hemoglobin 10.3 (L) 12.0 - 15.0 g/dL   HCT 32.5 (L) 36.0 - 46.0 %   MCV 71.4 (L) 78.0 - 100.0 fL   MCH 22.6 (L) 26.0 - 34.0 pg   MCHC 31.7 30.0 - 36.0 g/dL   RDW 16.2 (H) 11.5 - 15.5 %   Platelets 239 150 - 400 K/uL   Neutrophils Relative % 54 %   Neutro Abs 2.4 1.7 - 7.7 K/uL   Lymphocytes Relative 34 %   Lymphs Abs 1.5 0.7 - 4.0 K/uL   Monocytes Relative 10 %   Monocytes Absolute 0.4 0.1 - 1.0 K/uL   Eosinophils Relative 2 %   Eosinophils Absolute 0.1 0.0 - 0.7 K/uL   Basophils Relative 0 %   Basophils Absolute 0.0 0.0 - 0.1 K/uL  POC urine preg, ED  (not at [facility])  Result Value Ref Range   Preg Test, Ur NEGATIVE NEGATIVE   US Abdomen Complete  09/27/2015  CLINICAL DATA:  Abdominal pain since yesterday. EXAM: ABDOMEN ULTRASOUND COMPLETE COMPARISON:  11/30/2011 FINDINGS: Limited exam because of body habitus and bowel gas. Gallbladder: Echogenic shadowing gallstones noted within a contracted gallbladder. Wall thickness measures 2 mm. No Murphy's sign. No pericholecystic fluid. Common bile duct: Diameter: 2 mm Liver: No focal lesion identified. Within normal limits in parenchymal echogenicity. Patent portal vein with normal hepatopetal flow. IVC: No abnormality visualized. Pancreas: Visualized portion unremarkable. Spleen: Size and appearance within normal limits. Right Kidney: Length: 10.1 cm. Echogenicity within normal limits. No mass or hydronephrosis visualized. Left Kidney: Length: 10.4 cm. Echogenicity within normal limits. No mass or hydronephrosis visualized. Abdominal aorta: No aneurysm visualized. Other findings: None. IMPRESSION: Contracted gallbladder with cholelithiasis. No ultrasound evidence of acute cholecystitis or biliary dilatation. No other acute finding Electronically Signed   By: Jerilynn Mages.  Shick M.D.   On: 09/27/2015 14:22      Imaging Review US Abdomen Complete  09/27/2015  CLINICAL DATA:  Abdominal pain since yesterday. EXAM: ABDOMEN ULTRASOUND COMPLETE COMPARISON:  11/30/2011 FINDINGS: Limited exam because of body habitus and bowel gas. Gallbladder: Echogenic shadowing gallstones noted within a contracted gallbladder. Wall thickness measures 2 mm. No Murphy's sign. No pericholecystic fluid. Common bile duct: Diameter: 2 mm Liver: No focal lesion identified. Within normal limits in parenchymal echogenicity. Patent portal vein with normal hepatopetal flow. IVC: No abnormality visualized. Pancreas: Visualized portion unremarkable. Spleen: Size and appearance within normal limits. Right Kidney: Length: 10.1 cm. Echogenicity within normal  limits. No mass or hydronephrosis visualized. Left Kidney: Length: 10.4 cm. Echogenicity within normal limits. No mass or hydronephrosis visualized. Abdominal aorta: No aneurysm visualized. Other findings: None. IMPRESSION: Contracted gallbladder with cholelithiasis. No ultrasound evidence of acute cholecystitis or biliary dilatation. No other acute finding Electronically Signed   By: Jerilynn Mages.  Shick M.D.  On: 09/27/2015 14:22   I have personally reviewed and evaluated these images and lab results as part of my medical decision-making.   EKG Interpretation None      MDM   Final diagnoses:  Calculus of gallbladder without cholecystitis without obstruction    Patient presents with epigastric pain is been going on over a year. It seems to be worse after she eats meat. It does not appear that she's had a recent gallbladder ultrasound. I did do an abdominal ultrasound which does show gallstones but no evidence of cholecystitis. She has some mild anemia on her labs which is similar to her baseline values. Her other blood work is unremarkable. She has no ongoing pain. She was discharged home in good condition. She was given a prescription for Zantac and advised in a fat-free diet. She was given a referral to follow-up with [facility]. She was advised to return if she has any worsening pain vomiting or fevers.    [name], MD 09/27/15 [phone]

## 2015-09-27 NOTE — ED Notes (Signed)
Ultrasound called and stated it would be about 1 hour before they could come perform this ultrasound. Patient made aware.

## 2015-09-27 NOTE — Discharge Instructions (Signed)

## 2015-09-27 NOTE — ED Notes (Signed)
Pt states that every time she eats meat for the past year she has mid upper abd pain. Pt states she has been seen by a specialist for this. Pt ate some meat yesterday and states the pain is worse today. Pt denies N/V/D.

## 2015-09-27 NOTE — ED Notes (Signed)
Discharge instructions, follow up care, and rx x1 reviewed with patient. Patient verbalized understanding. 

## 2015-11-18 ENCOUNTER — Emergency Department (HOSPITAL_COMMUNITY)
Admission: EM | Admit: 2015-11-18 | Discharge: 2015-11-18 | Disposition: A | Discharge status: Home / Self Care | Payer: Medicaid Other | Attending: Emergency Medicine | Admitting: Emergency Medicine

## 2015-11-18 ENCOUNTER — Encounter (HOSPITAL_COMMUNITY): Payer: Self-pay

## 2015-11-18 DIAGNOSIS — K91873 Postprocedural seroma of a digestive system organ or structure following other procedure: Secondary | ICD-10-CM | POA: Insufficient documentation

## 2015-11-18 DIAGNOSIS — Z7984 Long term (current) use of oral hypoglycemic drugs: Secondary | ICD-10-CM | POA: Insufficient documentation

## 2015-11-18 DIAGNOSIS — Z91040 Latex allergy status: Secondary | ICD-10-CM | POA: Diagnosis not present

## 2015-11-18 DIAGNOSIS — Z79891 Long term (current) use of opiate analgesic: Secondary | ICD-10-CM | POA: Diagnosis not present

## 2015-11-18 DIAGNOSIS — E119 Type 2 diabetes mellitus without complications: Secondary | ICD-10-CM | POA: Insufficient documentation

## 2015-11-18 DIAGNOSIS — J45909 Unspecified asthma, uncomplicated: Secondary | ICD-10-CM | POA: Diagnosis not present

## 2015-11-18 DIAGNOSIS — IMO0002 Reserved for concepts with insufficient information to code with codable children: Secondary | ICD-10-CM

## 2015-11-18 DIAGNOSIS — I270 Primary pulmonary hypertension: Secondary | ICD-10-CM | POA: Insufficient documentation

## 2015-11-18 NOTE — Discharge Instructions (Signed)
This does not appear to be an infection at this time. Likely a seroma. Watch for pain or signs of infection. Follow-up as planned if things do not change.

## 2015-11-18 NOTE — ED Provider Notes (Signed)
CSN: [id number]     Arrival date & time 11/18/15  1417 History   First MD Initiated Contact with Patient 11/18/15 1515     Chief Complaint  Patient presents with  . postop problem      HPI Patient is about 5 days out from a laparoscopic cholecystectomy. States that she is worried because 2 of the sites of started to drain some fluid. States it looks like yellow fluid. No fevers or chills. States her abdomen is feeling better. No masses. States she also gets hookups and has occasional difficulty breathing. She is feeling well otherwise.  Past Medical History  Diagnosis Date  . Obesity   . Heart murmur   . Congenital ventricular septal defect   . Asthma   . Diabetes mellitus without complication ([place])   . VSD (ventricular septal defect), perimembranous     Qp:Qs 1.7:1 by catheterization   . Pulmonary hypertension (HCC)     mild   . Tricuspid regurgitation    Past Surgical History  Procedure Laterality Date  . Tubal ligation    . Cyst excision    . Left and right heart catheterization with coronary angiogram [name] 09/11/2013    Procedure: LEFT AND RIGHT HEART CATHETERIZATION WITH CORONARY ANGIOGRAM;  Surgeon: [name], MD;  Location: [facility] CATH LAB;  Service: Cardiovascular;  Laterality: [name];  . Cardiac catheterization [name] 10/30/2014    Procedure: Right Heart Cath;  Surgeon: [name], MD;  Location: [facility] INVASIVE CV LAB CUPID;  Service: Cardiovascular;  Laterality: [name];  . Cholecystectomy     Family History  Problem Relation Age of Onset  . Cancer Other   . Hyperlipidemia Other   . Hypertension Other   . Asthma Other   . Diabetes Mother   . Diabetes Father    Social History  Substance Use Topics  . Smoking status: Never Smoker   . Smokeless tobacco: Never Used  . Alcohol Use: No   OB History    No data available     Review of Systems  Constitutional: Negative for activity change and appetite change.  Eyes: Negative for pain.  Respiratory: Positive for  shortness of breath. Negative for chest tightness.   Cardiovascular: Negative for chest pain and leg swelling.  Gastrointestinal: Positive for abdominal pain. Negative for nausea, vomiting and diarrhea.  Genitourinary: Negative for flank pain.  Musculoskeletal: Negative for back pain and neck stiffness.  Skin: Positive for wound. Negative for rash.  Neurological: Negative for weakness, numbness and headaches.  Psychiatric/Behavioral: Negative for behavioral problems.      Allergies  Penicillins; Tape; Other; and Latex  Home Medications   Prior to Admission medications   Medication Sig Start Date End Date Taking? Authorizing Provider  albuterol (PROVENTIL) (2.5 MG/3ML) 0.083% nebulizer solution Inhale 2.5mg  four times a day as needed for shortness of breath 10/07/15  Yes Historical Provider, MD  Alogliptin-Metformin HCl (KAZANO) 12.10-998 MG TABS Take 1 tablet by mouth daily.    Yes Historical Provider, MD  amLODipine (NORVASC) 5 MG tablet Take 5 mg by mouth daily.  08/19/14  Yes Historical Provider, MD  fluticasone (FLONASE) 50 MCG/ACT nasal spray Place 2 sprays into both nostrils daily. 10/23/15  Yes Historical Provider, MD  oxyCODONE (OXY IR/ROXICODONE) 5 MG immediate release tablet Take 5-10mg  by mouth every 4 hours as needed for pain. 11/14/15  Yes Historical Provider, MD  PROAIR HFA 108 (90 Base) MCG/ACT inhaler Inhale 2 puffs into the lungs every 6 (six) hours as needed  for wheezing or shortness of breath.  10/23/15  Yes Historical Provider, MD  ibuprofen (ADVIL,MOTRIN) 800 MG tablet Take 1 tablet (800 mg total) by mouth 3 (three) times daily. Patient not taking: Reported on 09/27/2015 07/30/15   [name], PA-C  methocarbamol (ROBAXIN-750) 750 MG tablet Take 1 tablet (750 mg total) by mouth 4 (four) times daily. Patient not taking: Reported on 09/27/2015 07/13/15   [name], MD  metoCLOPramide (REGLAN) 10 MG tablet Take 1 tablet (10 mg total) by mouth every 8 (eight) hours as needed  for nausea. Patient not taking: Reported on 09/27/2015 07/30/15   [name], PA-C  ranitidine (ZANTAC) 150 MG capsule Take 1 capsule (150 mg total) by mouth daily. Patient not taking: Reported on 11/18/2015 09/27/15   [name], MD   BP 124/67 mmHg  Pulse 84  Temp(Src) 98 F (36.7 C) (Oral)  Resp 16  Ht 4\' 11"  (1.499 m)  Wt 161 lb (73.029 kg)  BMI 32.50 kg/m2  SpO2 98%  LMP 10/26/2015 Physical Exam  Constitutional: She appears well-developed.  HENT:  Head: Atraumatic.  Neck: Neck supple.  Cardiovascular: Normal rate and regular rhythm.   Pulmonary/Chest: Effort normal. No respiratory distress.  Abdominal:  Mild tenderness at the sites of intra-abdominal access. The middle left sided port hole and the right lower access site had slight what appears to be serous drainage. No mass. No erythema. No induration.  Neurological: She is alert.  Skin: Skin is warm.    ED Course  Procedures (including critical care time) Labs Review Labs Reviewed - No data to display  Imaging Review No results found. I have personally reviewed and evaluated these images and lab results as part of my medical decision-making.   EKG Interpretation None      MDM   Final diagnoses:  Seroma    Patient with occasional hiccups. Likely secondary to the surgery. Abdomen is overall relatively nontender. The 2 sites for she had drainage appears to be a serous drainage and was likely seroma and does not appear to be infection at this time. She has follow-up in just over a week with her surgeon. Will discharge home.    Davonna Belling, MD 11/18/15 [phone]

## 2015-11-18 NOTE — ED Notes (Signed)
Patient states she had a lap chole on 11/13/15. Patient states she noticed yellow drainage from the right upper wound today. Patient denies having a fever, but does c/o intermittent nausea.

## 2015-11-24 ENCOUNTER — Encounter (HOSPITAL_COMMUNITY): Payer: Self-pay

## 2015-11-24 ENCOUNTER — Emergency Department (HOSPITAL_COMMUNITY)
Admission: EM | Admit: 2015-11-24 | Discharge: 2015-11-24 | Disposition: A | Discharge status: Home / Self Care | Payer: Medicaid Other | Attending: Emergency Medicine | Admitting: Emergency Medicine

## 2015-11-24 DIAGNOSIS — Z791 Long term (current) use of non-steroidal anti-inflammatories (NSAID): Secondary | ICD-10-CM | POA: Insufficient documentation

## 2015-11-24 DIAGNOSIS — E119 Type 2 diabetes mellitus without complications: Secondary | ICD-10-CM | POA: Diagnosis not present

## 2015-11-24 DIAGNOSIS — J45909 Unspecified asthma, uncomplicated: Secondary | ICD-10-CM | POA: Diagnosis not present

## 2015-11-24 DIAGNOSIS — Z4801 Encounter for change or removal of surgical wound dressing: Secondary | ICD-10-CM | POA: Insufficient documentation

## 2015-11-24 DIAGNOSIS — Z79899 Other long term (current) drug therapy: Secondary | ICD-10-CM | POA: Diagnosis not present

## 2015-11-24 DIAGNOSIS — Z91040 Latex allergy status: Secondary | ICD-10-CM | POA: Insufficient documentation

## 2015-11-24 DIAGNOSIS — Z5189 Encounter for other specified aftercare: Secondary | ICD-10-CM

## 2015-11-24 NOTE — ED Notes (Signed)
Pt had surgery on 5/19.  Pt here for wound check.  Feels wound has opened.

## 2015-11-24 NOTE — ED Provider Notes (Signed)
History  By signing my name below, I, Mackenzie Patterson, attest that this documentation has been prepared under the direction and in the presence of [employer], PA-C. Electronically Signed: Bea Patterson, ED Scribe. 11/24/2015. 2:45 PM.  Chief Complaint  Patient presents with  . Post-op Problem    Gallbladder removed   The history is provided by the patient and medical records. No language interpreter was used.    HPI Comments:  Mackenzie Patterson is a 45 y.o. obese female who presents to the Emergency Department complaining of drainage from the surgical site of a cholecystectomy that she received 11 days ago done by Dr. Maryjean Patterson at [facility]. Pt was seen here for similar symptoms 6 days ago and was instructed to follow up with her surgeon in which she has not done yet and states she has an appt on 12/01/15. She states she contacted the office this morning but was instructed to present to the ED. Pt states she was washing one of the surgical sites today and the scab came off. She reports some mild tenderness of the area and mild bruising. She has not done anything to treat the issue. She denies modifying factors. She denies fever, chills, nausea, vomiting.   Past Medical History  Diagnosis Date  . Obesity   . Heart murmur   . Congenital ventricular septal defect   . Asthma   . Diabetes mellitus without complication ([place])   . VSD (ventricular septal defect), perimembranous     Qp:Qs 1.7:1 by catheterization   . Pulmonary hypertension (HCC)     mild   . Tricuspid regurgitation    Past Surgical History  Procedure Laterality Date  . Tubal ligation    . Cyst excision    . Left and right heart catheterization with coronary angiogram [name] 09/11/2013    Procedure: LEFT AND RIGHT HEART CATHETERIZATION WITH CORONARY ANGIOGRAM;  Surgeon: [name], MD;  Location: [facility] CATH LAB;  Service: Cardiovascular;  Laterality: [name];  . Cardiac catheterization [name] 10/30/2014    Procedure: Right Heart  Cath;  Surgeon: [name], MD;  Location: [facility] INVASIVE CV LAB CUPID;  Service: Cardiovascular;  Laterality: [name];  . Cholecystectomy     Family History  Problem Relation Age of Onset  . Cancer Other   . Hyperlipidemia Other   . Hypertension Other   . Asthma Other   . Diabetes Mother   . Diabetes Father    Social History  Substance Use Topics  . Smoking status: Never Smoker   . Smokeless tobacco: Never Used  . Alcohol Use: No   OB History    No data available     Review of Systems A complete 10 system review of systems was obtained and all systems are negative except as noted in the HPI and PMH.   Allergies  Penicillins; Tape; Other; and Latex  Home Medications   Prior to Admission medications   Medication Sig Start Date End Date Taking? Authorizing Provider  albuterol (PROVENTIL) (2.5 MG/3ML) 0.083% nebulizer solution Inhale 2.5mg  four times a day as needed for shortness of breath 10/07/15   Historical Provider, MD  Alogliptin-Metformin HCl (KAZANO) 12.10-998 MG TABS Take 1 tablet by mouth daily.     Historical Provider, MD  amLODipine (NORVASC) 5 MG tablet Take 5 mg by mouth daily.  08/19/14   Historical Provider, MD  fluticasone (FLONASE) 50 MCG/ACT nasal spray Place 2 sprays into both nostrils daily. 10/23/15   Historical Provider, MD  ibuprofen (ADVIL,MOTRIN) 800  MG tablet Take 1 tablet (800 mg total) by mouth 3 (three) times daily. Patient not taking: Reported on 09/27/2015 07/30/15   Mackenzie Meadow, PA-C  methocarbamol (ROBAXIN-750) 750 MG tablet Take 1 tablet (750 mg total) by mouth 4 (four) times daily. Patient not taking: Reported on 09/27/2015 07/13/15   Mackenzie Leigh, MD  metoCLOPramide (REGLAN) 10 MG tablet Take 1 tablet (10 mg total) by mouth every 8 (eight) hours as needed for nausea. Patient not taking: Reported on 09/27/2015 07/30/15   Mackenzie Meadow, PA-C  oxyCODONE (OXY IR/ROXICODONE) 5 MG immediate release tablet Take 5-10mg  by mouth every 4 hours as needed for pain.  11/14/15   Historical Provider, MD  PROAIR HFA 108 (90 Base) MCG/ACT inhaler Inhale 2 puffs into the lungs every 6 (six) hours as needed for wheezing or shortness of breath.  10/23/15   Historical Provider, MD  ranitidine (ZANTAC) 150 MG capsule Take 1 capsule (150 mg total) by mouth daily. Patient not taking: Reported on 11/18/2015 09/27/15   Mackenzie Johns, MD   Triage Vitals: BP 150/85 mmHg  Pulse 86  Temp(Src) 98.8 F (37.1 C) (Oral)  Resp 18  SpO2 100%  LMP 10/26/2015 Physical Exam  Constitutional: She is oriented to person, place, and time. She appears well-developed and well-nourished. No distress.  HENT:  Head: Normocephalic and atraumatic.  Eyes: Conjunctivae are normal. Right eye exhibits no discharge. Left eye exhibits no discharge. No scleral icterus.  Cardiovascular: Normal rate.   Pulmonary/Chest: Effort normal.  Abdominal: Soft. She exhibits no distension and no mass. There is no tenderness. There is no rebound and no guarding.  Well-healed post op wounds on abdomen. No surrounding erythema. Mild bruising. No active drainage.  Neurological: She is alert and oriented to person, place, and time. Coordination normal.  Skin: Skin is warm and dry. No rash noted. She is not diaphoretic. No erythema. No pallor.  Psychiatric: She has a normal mood and affect. Her behavior is normal.  Nursing note and vitals reviewed.   ED Course  Procedures (including critical care time) DIAGNOSTIC STUDIES: Oxygen Saturation is 100% on RA, normal by my interpretation.   COORDINATION OF CARE: 2:18 PM- Will contact surgeon. Informed pt that the area looked well and did not show signs of infection. Pt verbalizes understanding and agrees to plan.  Medications - No data to display    MDM   Final diagnoses:  Visit for wound check    Patient presents for a wound recheck status post cholecystectomy on 11/13/15. She states this morning one of the scab came off of her wound and she noticed some  clear drainage from it. Wound appears to be well-healed on exam. No purulent drainage. No surrounding erythema or sign of infection. Abdomen is soft and nontender. All vital signs are stable. Patient has follow-up appointment with her surgeon in 1 week. I attempted to contact the surgeon's office but was unable to get through. Do not feel the patient requires any antibiotics at this time. Recommend follow-up with her surgeon. Return precautions outlined in patient discharge instructions.  I personally performed the services described in this documentation, which was scribed in my presence. The recorded information has been reviewed and is accurate.    Dondra Spry [place], PA-C 11/24/15 1615  Daleen Bo, MD 11/24/15 [phone]

## 2015-11-24 NOTE — Discharge Instructions (Signed)
Keep scheduled appointment with your surgeon. Apply antibacterial ointment and bandaid daily. Return to the ED if you experience severe redness or swelling around your wound, severe abdominal tenderness, fevers, chills, pus drainage from wound.

## 2016-01-09 ENCOUNTER — Encounter (HOSPITAL_COMMUNITY): Payer: Self-pay | Admitting: Oncology

## 2016-01-09 ENCOUNTER — Emergency Department (HOSPITAL_COMMUNITY)
Admission: EM | Admit: 2016-01-09 | Discharge: 2016-01-09 | Disposition: A | Discharge status: Home / Self Care | Payer: Medicaid Other | Attending: Emergency Medicine | Admitting: Emergency Medicine

## 2016-01-09 DIAGNOSIS — I270 Primary pulmonary hypertension: Secondary | ICD-10-CM | POA: Insufficient documentation

## 2016-01-09 DIAGNOSIS — Z8679 Personal history of other diseases of the circulatory system: Secondary | ICD-10-CM | POA: Diagnosis not present

## 2016-01-09 DIAGNOSIS — Z791 Long term (current) use of non-steroidal anti-inflammatories (NSAID): Secondary | ICD-10-CM | POA: Insufficient documentation

## 2016-01-09 DIAGNOSIS — J45909 Unspecified asthma, uncomplicated: Secondary | ICD-10-CM | POA: Insufficient documentation

## 2016-01-09 DIAGNOSIS — E119 Type 2 diabetes mellitus without complications: Secondary | ICD-10-CM | POA: Diagnosis not present

## 2016-01-09 DIAGNOSIS — Z79899 Other long term (current) drug therapy: Secondary | ICD-10-CM | POA: Diagnosis not present

## 2016-01-09 DIAGNOSIS — M7711 Lateral epicondylitis, right elbow: Secondary | ICD-10-CM | POA: Insufficient documentation

## 2016-01-09 DIAGNOSIS — M7989 Other specified soft tissue disorders: Secondary | ICD-10-CM | POA: Diagnosis present

## 2016-01-09 MED ORDER — NAPROXEN 500 MG PO TABS: 500.0000 mg | ORAL_TABLET | Freq: Two times a day (BID) | ORAL | Status: DC

## 2016-01-09 MED ORDER — HYDROCODONE-ACETAMINOPHEN 5-325 MG PO TABS: 1.0000 | ORAL_TABLET | ORAL | Status: DC | PRN

## 2016-01-09 NOTE — Discharge Instructions (Signed)
Avoid using the Ace wrap. Avoid repetitive motion injuries. 2-3 times per day place an ice pack on your forearm at the point of maximum tenderness. Return to ER with any worsening swelling despite discontinuing the Ace wrap, any numbness, tingling, weakness, or worsening pain   Tennis Elbow Tennis elbow is puffiness (inflammation) of the outer tendons of your forearm close to your elbow. Your tendons attach your muscles to your bones. Tennis elbow can happen in any sport or job in which you use your elbow too much. It is caused by doing the same motion over and over. Tennis elbow can cause:  Pain and tenderness in your forearm and the outer part of your elbow.  A burning feeling. This runs from your elbow through your arm.  Weak grip in your hands. HOME CARE Activity  Rest your elbow and wrist as told by your doctor. Try to avoid any activities that caused the problem until your doctor says that you can do them again.  If a physical therapist teaches you exercises, do all of them as told.  If you lift an object, lift it with your palm facing up. This is easier on your elbow. Lifestyle  If your tennis elbow is caused by sports, check your equipment and make sure that:  You are using it correctly.  It fits you well.  If your tennis elbow is caused by work, take breaks often, if you are able. Talk with your manager about doing your work in a way that is safe for you.  If your tennis elbow is caused by computer use, talk with your manager about any changes that can be made to your work setup. General Instructions  If told, apply ice to the painful area:  Put ice in a plastic bag.  Place a towel between your skin and the bag.  Leave the ice on for 20 minutes, 2-3 times per day.  Take medicines only as told by your doctor.  If you were given a brace, wear it as told by your doctor.  Keep all follow-up visits as told by your doctor. This is important. GET HELP IF:  Your  pain does not get better with treatment.  Your pain gets worse.  You have weakness in your forearm, hand, or fingers.  You cannot feel your forearm, hand, or fingers.   This information is not intended to replace advice given to you by your health care provider. Make sure you discuss any questions you have with your health care provider.   Document Released: 12/01/2009 Document Revised: 10/28/2014 Document Reviewed: 06/09/2014 Elsevier Interactive Patient Education [employer].

## 2016-01-09 NOTE — ED Notes (Signed)
Pt c/o right arm pain x 1.5 weeks.  Last night pt noticed that the right arm began to swell.  Edema noted to right arm from elbow to hand.  Radial pulses +2 b/l.  Pt took 800 mg ibuprofen at 2000 w/o significant relief of pain.  Denies injury to arm.

## 2016-01-09 NOTE — ED Provider Notes (Signed)
CSN: [id number]     Arrival date & time 01/09/16  0443 History   First MD Initiated Contact with Patient 01/09/16 [phone]     Chief Complaint  Patient presents with  . Arm Swelling      HPI  Patient presents for evaluation of arm pain. Patient has had pain in the forearm for about a week and a half. No repetitive motion. No specific trauma. Has not been any new activities at home. Does not work. Less that she place an Ace wrap around her arm this morning her hand was swollen she became concerned and presents here. No OCPs. Nonsmoker. No history of malignancy.  Past Medical History  Diagnosis Date  . Obesity   . Heart murmur   . Congenital ventricular septal defect   . Asthma   . Diabetes mellitus without complication ([place])   . VSD (ventricular septal defect), perimembranous     Qp:Qs 1.7:1 by catheterization   . Pulmonary hypertension (HCC)     mild   . Tricuspid regurgitation    Past Surgical History  Procedure Laterality Date  . Tubal ligation    . Cyst excision    . Left and right heart catheterization with coronary angiogram [name] 09/11/2013    Procedure: LEFT AND RIGHT HEART CATHETERIZATION WITH CORONARY ANGIOGRAM;  Surgeon: [name], MD;  Location: [facility] CATH LAB;  Service: Cardiovascular;  Laterality: [name];  . Cardiac catheterization [name] 10/30/2014    Procedure: Right Heart Cath;  Surgeon: [name], MD;  Location: [facility] INVASIVE CV LAB CUPID;  Service: Cardiovascular;  Laterality: [name];  . Cholecystectomy     Family History  Problem Relation Age of Onset  . Cancer Other   . Hyperlipidemia Other   . Hypertension Other   . Asthma Other   . Diabetes Mother   . Diabetes Father    Social History  Substance Use Topics  . Smoking status: Never Smoker   . Smokeless tobacco: Never Used  . Alcohol Use: No   OB History    No data available     Review of Systems  Constitutional: Negative for fever, chills, diaphoresis, appetite change and fatigue.  HENT: Negative for  mouth sores, sore throat and trouble swallowing.   Eyes: Negative for visual disturbance.  Respiratory: Negative for cough, chest tightness, shortness of breath and wheezing.   Cardiovascular: Negative for chest pain.  Gastrointestinal: Negative for nausea, vomiting, abdominal pain, diarrhea and abdominal distention.  Endocrine: Negative for polydipsia, polyphagia and polyuria.  Genitourinary: Negative for dysuria, frequency and hematuria.  Musculoskeletal: Positive for myalgias and arthralgias. Negative for gait problem.  Skin: Negative for color change, pallor and rash.  Neurological: Negative for dizziness, syncope, light-headedness and headaches.  Hematological: Does not bruise/bleed easily.  Psychiatric/Behavioral: Negative for behavioral problems and confusion.      Allergies  Penicillins; Tape; Other; and Latex  Home Medications   Prior to Admission medications   Medication Sig Start Date End Date Taking? Authorizing Provider  albuterol (PROVENTIL) (2.5 MG/3ML) 0.083% nebulizer solution Inhale 2.5mg  four times a day as needed for shortness of breath 10/07/15  Yes Historical Provider, MD  Alogliptin-Metformin HCl (KAZANO) 12.10-998 MG TABS Take 1 tablet by mouth daily.    Yes Historical Provider, MD  amLODipine (NORVASC) 5 MG tablet Take 5 mg by mouth daily.  08/19/14  Yes Historical Provider, MD  ibuprofen (ADVIL,MOTRIN) 800 MG tablet Take 1 tablet (800 mg total) by mouth 3 (three) times daily. 07/30/15  Yes Magda Paganini  K Sofia, PA-C  methocarbamol (ROBAXIN-750) 750 MG tablet Take 1 tablet (750 mg total) by mouth 4 (four) times daily. 07/13/15  Yes [name], MD  oxyCODONE (OXY IR/ROXICODONE) 5 MG immediate release tablet Take 5-10mg  by mouth every 4 hours as needed for pain. 11/14/15  Yes Historical Provider, MD  PROAIR HFA 108 (90 Base) MCG/ACT inhaler Inhale 2 puffs into the lungs every 6 (six) hours as needed for wheezing or shortness of breath.  10/23/15  Yes Historical Provider, MD   HYDROcodone-acetaminophen (NORCO/VICODIN) 5-325 MG tablet Take 1 tablet by mouth every 4 (four) hours as needed. 01/09/16   Tanna Furry, MD  metoCLOPramide (REGLAN) 10 MG tablet Take 1 tablet (10 mg total) by mouth every 8 (eight) hours as needed for nausea. Patient not taking: Reported on 09/27/2015 07/30/15   [name], PA-C  naproxen (NAPROSYN) 500 MG tablet Take 1 tablet (500 mg total) by mouth 2 (two) times daily. 01/09/16   Tanna Furry, MD  ranitidine (ZANTAC) 150 MG capsule Take 1 capsule (150 mg total) by mouth daily. Patient not taking: Reported on 11/18/2015 09/27/15   [name], MD   BP 120/81 mmHg  Pulse 77  Temp(Src) 98.6 F (37 C) (Oral)  Resp 12  Ht 4\' 11"  (1.499 m)  Wt 155 lb (70.308 kg)  BMI 31.29 kg/m2  SpO2 100%  LMP 12/28/2015 (Exact Date) Physical Exam  Constitutional: She is oriented to person, place, and time. She appears well-developed and well-nourished. No distress.  HENT:  Head: Normocephalic.  Eyes: Conjunctivae are normal. Pupils are equal, round, and reactive to light. No scleral icterus.  Neck: Normal range of motion. Neck supple. No thyromegaly present.  Cardiovascular: Normal rate and regular rhythm.  Exam reveals no gallop and no friction rub.   No murmur heard. Pulmonary/Chest: Effort normal and breath sounds normal. No respiratory distress. She has no wheezes. She has no rales.  Abdominal: Soft. Bowel sounds are normal. She exhibits no distension. There is no tenderness. There is no rebound.  Musculoskeletal:       Right elbow: She exhibits decreased range of motion. She exhibits no swelling and no deformity. Tenderness found. Lateral epicondyle tenderness noted.  Patient with pain over the lateral condyle and pain with resisted extension at the wrist. Limited to the hand. Excellent capillary refill sensation radial pulses.  Neurological: She is alert and oriented to person, place, and time.  Skin: Skin is warm and dry. No rash noted.  Psychiatric:  She has a normal mood and affect. Her behavior is normal.    ED Course  Procedures (including critical care time) Labs Review Labs Reviewed - No data to display  Imaging Review No results found. I have personally reviewed and evaluated these images and lab results as part of my medical decision-making.   EKG Interpretation None      MDM   Final diagnoses:  Lateral epicondylitis, right    Symptoms and findings seem most consistent with lateral epicondylitis. Her tenderness is exclusively along the extensor surface into the lateral epicondyle. Soft compartment normal neurovascular exam. No predisposition for compartment. Has no risks for DVT. No OCPs, no smoking, no recent procedures or catheters area no family history of hypercoagulability.  She does not recall any specific repetitive motion. I believe her edema is from placing an Ace wrap on her forearm last night. The swelling is limited to her hand. Plan is avoid the Ace wrap, prescription  for anti-inflammatories and pain medications.  Return here  with any worsening edema despite avoiding Ace wrap worsening pain numbness or any disuse.    Tanna Furry, MD 01/09/16 [phone]

## 2016-01-26 ENCOUNTER — Other Ambulatory Visit: Payer: Self-pay | Admitting: Internal Medicine

## 2016-01-26 ENCOUNTER — Other Ambulatory Visit: Payer: Self-pay | Admitting: Cardiology

## 2016-01-26 DIAGNOSIS — R5381 Other malaise: Secondary | ICD-10-CM

## 2016-02-09 ENCOUNTER — Other Ambulatory Visit: Payer: Self-pay | Admitting: Cardiology

## 2016-02-09 DIAGNOSIS — N6452 Nipple discharge: Secondary | ICD-10-CM

## 2016-02-09 DIAGNOSIS — N644 Mastodynia: Secondary | ICD-10-CM

## 2016-02-12 ENCOUNTER — Other Ambulatory Visit: Payer: Medicaid Other

## 2016-03-03 ENCOUNTER — Ambulatory Visit
Admission: RE | Admit: 2016-03-03 | Discharge: 2016-03-03 | Disposition: A | Discharge status: Home / Self Care | Payer: Medicaid Other | Source: Ambulatory Visit | Attending: Cardiology | Admitting: Cardiology

## 2016-03-03 ENCOUNTER — Other Ambulatory Visit: Payer: Self-pay | Admitting: Cardiology

## 2016-03-03 DIAGNOSIS — N6452 Nipple discharge: Secondary | ICD-10-CM

## 2016-03-03 DIAGNOSIS — N644 Mastodynia: Secondary | ICD-10-CM

## 2016-03-10 ENCOUNTER — Encounter (HOSPITAL_COMMUNITY): Payer: Self-pay | Admitting: Emergency Medicine

## 2016-03-10 ENCOUNTER — Emergency Department (HOSPITAL_COMMUNITY)
Admission: EM | Admit: 2016-03-10 | Discharge: 2016-03-10 | Disposition: A | Discharge status: Home / Self Care | Payer: Medicaid Other | Attending: Emergency Medicine | Admitting: Emergency Medicine

## 2016-03-10 ENCOUNTER — Emergency Department (HOSPITAL_COMMUNITY): Payer: Medicaid Other

## 2016-03-10 DIAGNOSIS — I10 Essential (primary) hypertension: Secondary | ICD-10-CM | POA: Insufficient documentation

## 2016-03-10 DIAGNOSIS — E119 Type 2 diabetes mellitus without complications: Secondary | ICD-10-CM | POA: Diagnosis not present

## 2016-03-10 DIAGNOSIS — Z79899 Other long term (current) drug therapy: Secondary | ICD-10-CM | POA: Diagnosis not present

## 2016-03-10 DIAGNOSIS — J45909 Unspecified asthma, uncomplicated: Secondary | ICD-10-CM | POA: Insufficient documentation

## 2016-03-10 DIAGNOSIS — Z7951 Long term (current) use of inhaled steroids: Secondary | ICD-10-CM | POA: Diagnosis not present

## 2016-03-10 DIAGNOSIS — M25552 Pain in left hip: Secondary | ICD-10-CM

## 2016-03-10 LAB — PREGNANCY, URINE: Preg Test, Ur: NEGATIVE

## 2016-03-10 MED ORDER — MELOXICAM 15 MG PO TABS: 15.0000 mg | ORAL_TABLET | Freq: Every day | ORAL | 0 refills | Status: DC

## 2016-03-10 NOTE — ED Triage Notes (Signed)
Pt reports pain to left hip off and on for one month with increasing pain over the last few days. Reports falling close to the time that pain started but unsure if pain is related.

## 2016-03-10 NOTE — ED Notes (Signed)
Bed: WA28 Expected date:  Expected time:  Means of arrival:  Comments: 

## 2016-03-10 NOTE — ED Provider Notes (Signed)
[facility] DEPT Provider Note   CSN: [id number] Arrival date & time: 03/10/16  1143   By signing my name below, I, Estanislado Pandy, attest that this documentation has been prepared under the direction and in the presence of Boston Cookson L. Alysson Geist, PA-C. Electronically Signed: Estanislado Pandy, Scribe. 03/10/2016. 2:30 PM.   History   Chief Complaint Chief Complaint  Patient presents with  . Hip Pain    The history is provided by the patient. No language interpreter was used.   HPI Comments:  Mackenzie Patterson is a 45 y.o. female who presents to the Emergency Department complaining of intermittent L hip pain radiating down lateral leg that began 1 month ago. Pt states that L hip pain has worsened over the last few days. Pt rates the pain at 5/10 and >10/10 when walking, and describes the pain as "throbbing." Pt denies previous similar pain. Pt states that pain is exacerbated when walking, laying on her left side in bed, and trying to get up. Pt has used icy-hot, hot rags and oxycodone, and ibuprofen 800mg  without relief. Pt denies numbness/tingling, fever, chills, injuries.   Past Medical History:  Diagnosis Date  . Asthma   . Congenital ventricular septal defect   . Diabetes mellitus without complication ([place])   . Heart murmur   . Obesity   . Pulmonary hypertension (HCC)    mild   . Tricuspid regurgitation   . VSD (ventricular septal defect), perimembranous    Qp:Qs 1.7:1 by catheterization     Patient Active Problem List   Diagnosis Date Noted  . Breath shortness 04/16/2014  . Tricuspid regurgitation   . Asthma   . VSD (ventricular septal defect), perimembranous 09/03/2013  . Pulmonary hypertension ([place]) 09/03/2013  . Essential hypertension 08/06/2013  . Diabetes mellitus ([place]) 08/06/2013  . Obesity 08/06/2013  . BP (high blood pressure) 07/27/2012  . Awareness of heartbeats 07/27/2012  . 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 Conoventricular VSD 07/27/2012     Past Surgical History:  Procedure Laterality Date  . CARDIAC CATHETERIZATION [name] 10/30/2014   Procedure: Right Heart Cath;  Surgeon: [name], MD;  Location: [facility] INVASIVE CV LAB CUPID;  Service: Cardiovascular;  Laterality: [name];  . CHOLECYSTECTOMY    . CYST EXCISION    . LEFT AND RIGHT HEART CATHETERIZATION WITH CORONARY ANGIOGRAM [name] 09/11/2013   Procedure: LEFT AND RIGHT HEART CATHETERIZATION WITH CORONARY ANGIOGRAM;  Surgeon: [name], MD;  Location: [facility] CATH LAB;  Service: Cardiovascular;  Laterality: [name];  . TUBAL LIGATION      OB History    No data available       Home Medications    Prior to Admission medications   Medication Sig Start Date End Date Taking? Authorizing Provider  albuterol (PROVENTIL) (2.5 MG/3ML) 0.083% nebulizer solution Inhale 2.5mg  four times a day as needed for shortness of breath 10/07/15   Historical Provider, MD  Alogliptin-Metformin HCl (KAZANO) 12.10-998 MG TABS Take 1 tablet by mouth daily.     Historical Provider, MD  amLODipine (NORVASC) 5 MG tablet Take 5 mg by mouth daily.  08/19/14   Historical Provider, MD  HYDROcodone-acetaminophen (NORCO/VICODIN) 5-325 MG tablet Take 1 tablet by mouth every 4 (four) hours as needed. 01/09/16   Tanna Furry, MD  ibuprofen (ADVIL,MOTRIN) 800 MG tablet Take 1 tablet (800 mg total) by mouth 3 (three) times daily. 07/30/15   [name], PA-C  meloxicam (MOBIC) 15 MG tablet Take 1 tablet (15 mg total) by mouth daily.  03/10/16   Kalman Drape, PA  methocarbamol (ROBAXIN-750) 750 MG tablet Take 1 tablet (750 mg total) by mouth 4 (four) times daily. 07/13/15   [name], MD  metoCLOPramide (REGLAN) 10 MG tablet Take 1 tablet (10 mg total) by mouth every 8 (eight) hours as needed for nausea. Patient not taking: Reported on 09/27/2015 07/30/15   [name], PA-C  naproxen (NAPROSYN) 500 MG tablet Take 1 tablet (500 mg total) by mouth 2 (two) times daily. 01/09/16   Tanna Furry, MD  oxyCODONE (OXY  IR/ROXICODONE) 5 MG immediate release tablet Take 5-10mg  by mouth every 4 hours as needed for pain. 11/14/15   Historical Provider, MD  PROAIR HFA 108 (90 Base) MCG/ACT inhaler Inhale 2 puffs into the lungs every 6 (six) hours as needed for wheezing or shortness of breath.  10/23/15   Historical Provider, MD  ranitidine (ZANTAC) 150 MG capsule Take 1 capsule (150 mg total) by mouth daily. Patient not taking: Reported on 11/18/2015 09/27/15   [name], MD    Family History Family History  Problem Relation Age of Onset  . Cancer Other   . Hyperlipidemia Other   . Hypertension Other   . Asthma Other   . Diabetes Mother   . Diabetes Father     Social History Social History  Substance Use Topics  . Smoking status: Never Smoker  . Smokeless tobacco: Never Used  . Alcohol use No     Allergies   Penicillins; Tape; Other; and Latex   Review of Systems Review of Systems  Constitutional: Negative for chills and fever.  Musculoskeletal: Positive for arthralgias and gait problem.  Neurological: Negative for numbness.     Physical Exam Updated Vital Signs BP 149/90 (BP Location: Right Arm)   Pulse 88   Temp 98.2 F (36.8 C) (Oral)   Resp 16   Ht 4\' 11"  (1.499 m)   Wt 165 lb (74.8 kg)   LMP 03/10/2016 (Exact Date) Comment: neg preg test 03/10/16  SpO2 99%   BMI 33.33 kg/m   Physical Exam  Constitutional: She appears well-developed and well-nourished. No distress.  HENT:  Head: Normocephalic and atraumatic.  Eyes: Conjunctivae are normal.  Pulmonary/Chest: Effort normal. No respiratory distress.  Musculoskeletal:  Limit ROM in L hip due to pain. TTP to greater trochanter  and IT band/lateral thigh.   No ecchymosis, edema or deformity. Pt neurovascular intact distally   Neurological: She is alert. Coordination normal.  Skin: Skin is warm and dry. She is not diaphoretic.  Psychiatric: She has a normal mood and affect. Her behavior is normal.  Nursing note and vitals  reviewed.    ED Treatments / Results  DIAGNOSTIC STUDIES:  Oxygen Saturation is 99% on RA, normal by my interpretation.    COORDINATION OF CARE:  2:30 PM Discussed treatment plan with pt at bedside and pt agreed to plan.   Labs (all labs ordered are listed, but only abnormal results are displayed) Labs Reviewed  PREGNANCY, URINE    EKG  EKG Interpretation None       Radiology Dg Hip Unilat W Or Wo Pelvis 2-3 Views Left  Result Date: 03/10/2016 CLINICAL DATA:  Left hip pain since a fall last month. Pain is made worse with weight-bearing and results in limping. EXAM: DG HIP (WITH OR WITHOUT PELVIS) 2-3V LEFT COMPARISON:  AP view of the abdomen and pelvis dated August 18, 2005 which included the hips. FINDINGS: The bones of the pelvis are subjectively adequately  mineralized. There is no lytic or blastic lesion. There is no acute or healing fracture. AP and lateral views of the left hip reveal preservation of the joint space. The articular surfaces of the femoral head and acetabulum are smoothly rounded. The femoral neck, intertrochanteric, and subtrochanteric regions are normal. IMPRESSION: There is no acute or significant chronic bony abnormality of the left hip. The observed portions of the bony pelvis are normal. Electronically Signed   By: David  Martinique M.D.   On: 03/10/2016 14:12    Procedures Procedures (including critical care time)  Medications Ordered in ED Medications - No data to display   Initial Impression / Assessment and Plan / ED Course  I have reviewed the triage vital signs and the nursing notes.  Pertinent labs & imaging results that were available during my care of the patient were reviewed by me and considered in my medical decision making (see chart for details).  Clinical Course   Patient with Left hip pain. Presentation concerning for trochanteric bursitis. X-rays reviewed by me revealed no bony abnormality, dislocation, or fracture. Patient is  neurovascularly intact distally and able to move hip although states it is painful.  Discussed RICE and pain medication. Instructed patient to follow up with their primary care provider and orthopedics within 2-3 days to have hip reevaluated. Discussed strict return precautions to the ED. patient appears reliable and expressed understanding to the discharge instructions.   I personally performed the services described in this documentation, which was scribed in my presence. The recorded information has been reviewed and is accurate.   Final Clinical Impressions(s) / ED Diagnoses   Final diagnoses:  Left hip pain    New Prescriptions New Prescriptions   MELOXICAM (MOBIC) 15 MG TABLET    Take 1 tablet (15 mg total) by mouth daily.     Kalman Drape, [place] 03/10/16 1442    Leo Grosser, MD 03/12/16 1124

## 2016-03-10 NOTE — Discharge Instructions (Signed)
Use ice on your hip as often as you can, 20 minutes on, 20 minutes off and be sure to keep a thin cloth between your skin and the ice. Take ibuprofen as needed for pain and be sure to eat with this medication as it can be hard in your stomach. Follow-up with your primary care provider in 1-2 days. You may need a referral to orthopedics from your primary care provider. If not follow-up with orthopedics in 1-2 days.   Return to the emergency department if her pain worsens, uterus fever, numbness/timing, weakness, loss of bowel bladder function, or any other concerning symptoms.

## 2016-03-14 ENCOUNTER — Encounter: Payer: Self-pay | Admitting: *Deleted

## 2016-03-31 ENCOUNTER — Encounter (HOSPITAL_COMMUNITY): Payer: Self-pay | Admitting: Emergency Medicine

## 2016-03-31 ENCOUNTER — Emergency Department (HOSPITAL_COMMUNITY)
Admission: EM | Admit: 2016-03-31 | Discharge: 2016-03-31 | Disposition: A | Discharge status: Home / Self Care | Payer: Medicaid Other | Attending: Emergency Medicine | Admitting: Emergency Medicine

## 2016-03-31 ENCOUNTER — Emergency Department (HOSPITAL_COMMUNITY): Payer: Medicaid Other

## 2016-03-31 DIAGNOSIS — Z91040 Latex allergy status: Secondary | ICD-10-CM | POA: Insufficient documentation

## 2016-03-31 DIAGNOSIS — M542 Cervicalgia: Secondary | ICD-10-CM | POA: Diagnosis not present

## 2016-03-31 DIAGNOSIS — J45909 Unspecified asthma, uncomplicated: Secondary | ICD-10-CM | POA: Diagnosis not present

## 2016-03-31 DIAGNOSIS — R51 Headache: Secondary | ICD-10-CM | POA: Diagnosis not present

## 2016-03-31 DIAGNOSIS — R519 Headache, unspecified: Secondary | ICD-10-CM

## 2016-03-31 DIAGNOSIS — E119 Type 2 diabetes mellitus without complications: Secondary | ICD-10-CM | POA: Diagnosis not present

## 2016-03-31 LAB — BASIC METABOLIC PANEL
Anion gap: 6 (ref 5–15)
BUN: 13 mg/dL (ref 6–20)
CHLORIDE: 106 mmol/L (ref 101–111)
CO2: 23 mmol/L (ref 22–32)
CREATININE: 0.85 mg/dL (ref 0.44–1.00)
Calcium: 8.8 mg/dL — ABNORMAL LOW (ref 8.9–10.3)
GFR calc non Af Amer: >60 mL/min (ref >60)
Glucose, Bld: 213 mg/dL — ABNORMAL HIGH (ref 65–99)
POTASSIUM: 4.1 mmol/L (ref 3.5–5.1)
SODIUM: 135 mmol/L (ref 135–145)

## 2016-03-31 LAB — URINALYSIS, ROUTINE W REFLEX MICROSCOPIC
Bilirubin Urine: NEGATIVE
GLUCOSE, UA: NEGATIVE mg/dL
Hgb urine dipstick: NEGATIVE
Ketones, ur: NEGATIVE mg/dL
LEUKOCYTES UA: NEGATIVE
Nitrite: NEGATIVE
PROTEIN: NEGATIVE mg/dL
Specific Gravity, Urine: 1.031 — ABNORMAL HIGH (ref 1.005–1.030)
pH: 5.5 (ref 5.0–8.0)

## 2016-03-31 LAB — CBC
HEMATOCRIT: 36.1 % (ref 36.0–46.0)
Hemoglobin: 11.3 g/dL — ABNORMAL LOW (ref 12.0–15.0)
MCH: 23.6 pg — ABNORMAL LOW (ref 26.0–34.0)
MCHC: 31.3 g/dL (ref 30.0–36.0)
MCV: 75.5 fL — AB (ref 78.0–100.0)
PLATELETS: 201 10*3/uL (ref 150–400)
RBC: 4.78 MIL/uL (ref 3.87–5.11)
RDW: 15.9 % — ABNORMAL HIGH (ref 11.5–15.5)
WBC: 4.9 10*3/uL (ref 4.0–10.5)

## 2016-03-31 LAB — CK: Total CK: 72 U/L (ref 38–234)

## 2016-03-31 MED ORDER — DOCUSATE SODIUM 100 MG PO CAPS: 100.0000 mg | ORAL_CAPSULE | Freq: Two times a day (BID) | ORAL | 0 refills | Status: DC

## 2016-03-31 MED ORDER — DIPHENHYDRAMINE HCL 50 MG/ML IJ SOLN
50.0000 mg | Freq: Once | INTRAMUSCULAR | Status: AC
  Administered 2016-03-31: 50 mg via INTRAVENOUS
  Filled 2016-03-31: qty 1

## 2016-03-31 MED ORDER — SODIUM CHLORIDE 0.9 % IV BOLUS (SEPSIS)
1000.0000 mL | Freq: Once | INTRAVENOUS | Status: AC
  Administered 2016-03-31: 1000 mL via INTRAVENOUS

## 2016-03-31 MED ORDER — HYDROCODONE-ACETAMINOPHEN 5-325 MG PO TABS: 2.0000 | ORAL_TABLET | ORAL | 0 refills | Status: DC | PRN

## 2016-03-31 MED ORDER — KETOROLAC TROMETHAMINE 30 MG/ML IJ SOLN
30.0000 mg | Freq: Once | INTRAMUSCULAR | Status: AC
  Administered 2016-03-31: 30 mg via INTRAVENOUS
  Filled 2016-03-31: qty 1

## 2016-03-31 MED ORDER — METOCLOPRAMIDE HCL 5 MG/ML IJ SOLN
10.0000 mg | Freq: Once | INTRAMUSCULAR | Status: AC
  Administered 2016-03-31: 10 mg via INTRAVENOUS
  Filled 2016-03-31: qty 2

## 2016-03-31 NOTE — ED Provider Notes (Signed)
Emergency Department Provider Note   I have reviewed the triage vital signs and the nursing notes.   HISTORY  Chief Complaint Neck Pain; Headache; and Dizziness   HPI Skyann Mutton is a 45 y.o. female with PMH of asthma, DM, VSD, and tricuspid regurgitation presents to the emergency room in for evaluation of 2 weeks of right lateral neck and shoulder pain and new onset headache today that feels similar to prior migraines. Patient reports no obvious inciting event for her neck and shoulder pain. He is experiencing pain only on the right side. No numbness or weakness in the right upper extremity. No similar pain to this in the past. No injury. Pain is severe both with movement and without. The patient has tried Excedrin, muscle rubs, muscle relaxers with no relief.   The patient's headache which began today is primarily behind her right eye. She has associated photophobia. She has had similar headaches in the past with her migraines. She has not taken any over-the-counter medications to relieve this headache.  Past Medical History:  Diagnosis Date  . Asthma   . Congenital ventricular septal defect   . Diabetes mellitus without complication ([place])   . Heart murmur   . Obesity   . Pulmonary hypertension    mild   . Tricuspid regurgitation   . VSD (ventricular septal defect), perimembranous    Qp:Qs 1.7:1 by catheterization     Patient Active Problem List   Diagnosis Date Noted  . Breath shortness 04/16/2014  . Tricuspid regurgitation   . Asthma   . VSD (ventricular septal defect), perimembranous 09/03/2013  . Pulmonary hypertension 09/03/2013  . Essential hypertension 08/06/2013  . Diabetes mellitus ([place]) 08/06/2013  . Obesity 08/06/2013  . BP (high blood pressure) 07/27/2012  . Awareness of heartbeats 07/27/2012  . 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 Conoventricular VSD 07/27/2012    Past Surgical History:  Procedure Laterality Date  . CARDIAC  CATHETERIZATION [name] 10/30/2014   Procedure: Right Heart Cath;  Surgeon: [name], MD;  Location: [facility] INVASIVE CV LAB CUPID;  Service: Cardiovascular;  Laterality: [name];  . CHOLECYSTECTOMY    . CYST EXCISION    . LEFT AND RIGHT HEART CATHETERIZATION WITH CORONARY ANGIOGRAM [name] 09/11/2013   Procedure: LEFT AND RIGHT HEART CATHETERIZATION WITH CORONARY ANGIOGRAM;  Surgeon: [name], MD;  Location: [facility] CATH LAB;  Service: Cardiovascular;  Laterality: [name];  . TUBAL LIGATION      Current Outpatient Rx  . Order #: [id number] Class: Historical Med  . Order #: [id number] Class: Historical Med  . Order #: [id number] Class: Historical Med  . Order #: [id number] Class: Print  . Order #: [id number] Class: Print  . Order #: [id number] Class: Print  . Order #: [id number] Class: Print  . Order #: [id number] Class: Print  . Order #: [id number] Class: Print  . Order #: [id number] Class: Print  . Order #: [id number] Class: Historical Med  . Order #: [id number] Class: Historical Med  . Order #: [id number] Class: Print    Allergies Penicillins; Tape; Other; and Latex  Family History  Problem Relation Age of Onset  . Cancer Other   . Hyperlipidemia Other   . Hypertension Other   . Asthma Other   . Diabetes Mother   . Diabetes Father     Social History Social History  Substance Use Topics  . Smoking status: Never Smoker  . Smokeless tobacco: Never Used  . Alcohol use No    Review of Systems  Constitutional: No fever/chills Eyes: No visual changes. ENT: No  sore throat. Cardiovascular: Denies chest pain. Respiratory: Denies shortness of breath. Gastrointestinal: No abdominal pain.  No nausea, no vomiting.  No diarrhea.  No constipation. Genitourinary: Negative for dysuria. Musculoskeletal: Negative for back pain. Right neck and shoulder pain.  Skin: Negative for rash. Neurological: Negative for focal weakness or numbness. Positive for HA.   10-point ROS otherwise  negative.  ____________________________________________   PHYSICAL EXAM:  VITAL SIGNS: ED Triage Vitals  Enc Vitals Group     BP 03/31/16 1355 142/82     Pulse Rate 03/31/16 1355 92     Resp 03/31/16 1355 16     Temp 03/31/16 1355 97.7 F (36.5 C)     Temp Source 03/31/16 1355 Oral     SpO2 03/31/16 1355 95 %     Pain Score 03/31/16 1354 10    Constitutional: Alert and oriented. Well appearing and in no acute distress. Eyes: Conjunctivae are normal. PERRL. EOMI. Head: Atraumatic. Nose: No congestion/rhinnorhea. Mouth/Throat: Mucous membranes are moist.  Oropharynx non-erythematous. Neck: No stridor. No cervical spine tenderness to palpation. Cardiovascular: Normal rate, regular rhythm. Good peripheral circulation. Grossly normal heart sounds.   Respiratory: Normal respiratory effort.  No retractions. Lungs CTAB. Gastrointestinal: Soft and nontender. No distention.  Musculoskeletal: No lower extremity tenderness nor edema. No gross deformities of extremities. Tenderness to palpation of the right lateral neck and right shoulder over the territory of the trapezius.  Neurologic:  Normal speech and language. No gross focal neurologic deficits are appreciated.  Skin:  Skin is warm, dry and intact. No rash noted. Psychiatric: Mood and affect are normal. Speech and behavior are normal.  ____________________________________________   LABS (all labs ordered are listed, but only abnormal results are displayed)  Labs Reviewed  BASIC METABOLIC PANEL - Abnormal; Notable for the following:       Result Value   Glucose, Bld 213 (*)    Calcium 8.8 (*)    All other components within normal limits  CBC - Abnormal; Notable for the following:    Hemoglobin 11.3 (*)    MCV 75.5 (*)    MCH 23.6 (*)    RDW 15.9 (*)    All other components within normal limits  URINALYSIS, ROUTINE W REFLEX MICROSCOPIC (NOT AT [facility]) - Abnormal; Notable for the following:    APPearance CLOUDY (*)     Specific Gravity, Urine 1.031 (*)    All other components within normal limits  CK   ____________________________________________  EKG   EKG Interpretation  Date/Time:  Thursday March 31 2016 14:01:09 EDT Ventricular Rate:  89 PR Interval:    QRS Duration: 94 QT Interval:  369 QTC Calculation: 449 R Axis:   71 Text Interpretation:  Sinus rhythm Consider left ventricular hypertrophy NO STEMI.  Compared to prior.  Confirmed by Jameer Storie MD, Mikena Masoner [phone]) on 03/31/2016 2:04:43 PM Also confirmed by Janira Mandell MD, Colbey Wirtanen [phone]), editor Stout CT, Leda Gauze [phone])  on 03/31/2016 2:06:35 PM       ____________________________________________  RADIOLOGY  Dg Chest 2 View  Result Date: 03/31/2016 CLINICAL DATA:  Right neck and shoulder pain for the past few weeks. Left-sided headache. History of heart murmur. EXAM: CHEST  2 VIEW COMPARISON:  Multiple priors dating back through 10/20/2013 FINDINGS: Stable cardiomegaly. No aortic aneurysm. Clear lungs. No acute osseous abnormality. IMPRESSION: No active cardiopulmonary disease.  Stable cardiomegaly. Electronically Signed   By: Ashley Royalty M.D.   On: 03/31/2016 18:32   Ct Cervical Spine Wo Contrast  Result Date: 03/31/2016 CLINICAL  DATA:  45 year old female with right neck and shoulder pain for several weeks. Left side headache today. Initial encounter. EXAM: CT CERVICAL SPINE WITHOUT CONTRAST TECHNIQUE: Multidetector CT imaging of the cervical spine was performed without intravenous contrast. Multiplanar CT image reconstructions were also generated. COMPARISON:  Head CT without contrast 12/30/2006. FINDINGS: Alignment: Straightening of cervical lordosis. Cervicothoracic junction alignment is within normal limits. Bilateral posterior element alignment is within normal limits. Skull base and vertebrae: Visualized skull base is intact. No atlanto-occipital dissociation. Normal C1-C2 alignment. No cervical spine fracture identified. Soft tissues and spinal  canal: No prevertebral fluid or swelling. No visible canal hematoma.Negative visualized posterior fossa structures. Negative visualized noncontrast neck soft tissues. Disc levels: C2-C3:  Negative. C3-C4:  Negative. C4-C5: Mild disc space loss. Right eccentric disc bulge. Broad-based posterior component. Borderline to mild spinal stenosis is possible. No foraminal stenosis. C5-C6: Mild disc bulge and right greater than left uncovertebral hypertrophy. Up to mild right C6 foraminal stenosis. C6-C7: Mild disc space loss. Right eccentric disc bulge with broad-based posterior component. Borderline to mild spinal stenosis is possible. Mild uncovertebral hypertrophy on the right. No definite foraminal stenosis. C7-T1:  Mostly anterior disc bulging.  No definite stenosis. Upper chest: Negative lung apices aside from mild mosaic attenuation. Visible upper thoracic levels appear intact. Other: Visualized paranasal sinuses and mastoids are stable and well pneumatized. IMPRESSION: 1.  No acute osseous abnormality in the cervical spine. 2. Cervical spine disc degeneration at C4-C5 and C6-C7 with up to mild cervical spinal stenosis. No definite cervical foraminal stenosis. Electronically Signed   By: Genevie Ann M.D.   On: 03/31/2016 18:30    ____________________________________________   PROCEDURES  Procedure(s) performed:   Procedures  None ____________________________________________   INITIAL IMPRESSION / ASSESSMENT AND PLAN / ED COURSE  Pertinent labs & imaging results that were available during my care of the patient were reviewed by me and considered in my medical decision making (see chart for details).  Patient resents to the emergency department for evaluation of 2 weeks of right neck and shoulder pain with headache started today. Headache is typical of her migraines in the past. No focal neurological deficits. The patient has tenderness and tightness over the right trapezius. That her neck and shoulder  pain is musculoskeletal in origin. She has no fever or chills or meningeal signs to suggest CNS infection. Plan for CK, labs, IV fluids, migraine cocktail treatment reassess.   07:02 PM Patient's HA is resolved. Shoulder pain is feeling better after Toradol. Plan to discharge home with primary care physician follow-up and orthopedic follow-up as needed. CT scan of the neck and chest is normal. Suspect musculoskeletal cause of the patient's symptoms. No elevated CK to suggest myositis.  At this time, I do not feel there is any life-threatening condition present. I have reviewed and discussed all results (EKG, imaging, lab, urine as appropriate), exam findings with patient. I have reviewed nursing notes and appropriate previous records.  I feel the patient is safe to be discharged home without further emergent workup. Discussed usual and customary return precautions. Patient and family (if present) verbalize understanding and are comfortable with this plan.  Patient will follow-up with their primary care provider. If they do not have a primary care provider, information for follow-up has been provided to them. All questions have been answered.  ____________________________________________  FINAL CLINICAL IMPRESSION(S) / ED DIAGNOSES  Final diagnoses:  Neck pain  Nonintractable headache, unspecified chronicity pattern, unspecified headache type     MEDICATIONS  GIVEN DURING THIS VISIT:  Medications  sodium chloride 0.9 % bolus 1,000 mL (1,000 mLs Intravenous New Bag/Given 03/31/16 1650)  ketorolac (TORADOL) 30 MG/ML injection 30 mg (30 mg Intravenous Given 03/31/16 1650)  metoCLOPramide (REGLAN) injection 10 mg (10 mg Intravenous Given 03/31/16 1651)  diphenhydrAMINE (BENADRYL) injection 50 mg (50 mg Intravenous Given 03/31/16 1651)     NEW OUTPATIENT MEDICATIONS STARTED DURING THIS VISIT:  New Prescriptions   DOCUSATE SODIUM (COLACE) 100 MG CAPSULE    Take 1 capsule (100 mg total) by mouth  every 12 (twelve) hours.   HYDROCODONE-ACETAMINOPHEN (NORCO/VICODIN) 5-325 MG TABLET    Take 2 tablets by mouth every 4 (four) hours as needed.      Note:  This document was prepared using Dragon voice recognition software and may include unintentional dictation errors.  Nanda Quinton, MD Emergency Medicine   Margette Fast, MD 03/31/16 [phone]

## 2016-03-31 NOTE — ED Triage Notes (Signed)
Pt reports R neck/shoulder pain for the past few weeks. Today started having a L side HA with light and sound sensitivity, nausea, and lightheadedness.

## 2016-03-31 NOTE — Discharge Instructions (Signed)

## 2016-04-25 ENCOUNTER — Ambulatory Visit
Admission: RE | Admit: 2016-04-25 | Discharge: 2016-04-25 | Disposition: A | Discharge status: Home / Self Care | Payer: Medicaid Other | Source: Ambulatory Visit | Attending: Cardiology | Admitting: Cardiology

## 2016-04-25 ENCOUNTER — Other Ambulatory Visit: Payer: Self-pay | Admitting: Cardiology

## 2016-04-25 DIAGNOSIS — M25552 Pain in left hip: Secondary | ICD-10-CM

## 2016-05-22 ENCOUNTER — Encounter (HOSPITAL_COMMUNITY): Payer: Self-pay | Admitting: Emergency Medicine

## 2016-05-22 ENCOUNTER — Emergency Department (HOSPITAL_COMMUNITY)
Admission: EM | Admit: 2016-05-22 | Discharge: 2016-05-22 | Disposition: A | Discharge status: Home / Self Care | Payer: Medicaid Other | Attending: Emergency Medicine | Admitting: Emergency Medicine

## 2016-05-22 DIAGNOSIS — I10 Essential (primary) hypertension: Secondary | ICD-10-CM | POA: Insufficient documentation

## 2016-05-22 DIAGNOSIS — Z7984 Long term (current) use of oral hypoglycemic drugs: Secondary | ICD-10-CM | POA: Diagnosis not present

## 2016-05-22 DIAGNOSIS — Z91040 Latex allergy status: Secondary | ICD-10-CM | POA: Insufficient documentation

## 2016-05-22 DIAGNOSIS — Z79899 Other long term (current) drug therapy: Secondary | ICD-10-CM | POA: Diagnosis not present

## 2016-05-22 DIAGNOSIS — E119 Type 2 diabetes mellitus without complications: Secondary | ICD-10-CM | POA: Diagnosis not present

## 2016-05-22 DIAGNOSIS — J45909 Unspecified asthma, uncomplicated: Secondary | ICD-10-CM | POA: Insufficient documentation

## 2016-05-22 DIAGNOSIS — J029 Acute pharyngitis, unspecified: Secondary | ICD-10-CM | POA: Diagnosis present

## 2016-05-22 LAB — RAPID STREP SCREEN (MED CTR MEBANE ONLY): STREPTOCOCCUS, GROUP A SCREEN (DIRECT): NEGATIVE

## 2016-05-22 NOTE — ED Triage Notes (Addendum)
Patient reports sore throat, runny nose, and nasal congestion x3 days. Denies N/V/D and abdominal pain, fever.

## 2016-05-22 NOTE — ED Provider Notes (Signed)
[facility] DEPT Provider Note   CSN: [id number] Arrival date & time: 05/22/16  1953  By signing my name below, I, Gwenlyn Fudge, attest that this documentation has been prepared under the direction and in the presence of [employer], PA-C. Electronically Signed: Gwenlyn Fudge, ED Scribe. 05/22/16. 8:22 PM.  History   Chief Complaint Chief Complaint  Patient presents with  . Sore Throat   The history is provided by the patient. No language interpreter was used.   HPI Comments: Mackenzie Patterson is a 45 y.o. female with PMHx of DM, Pulmonary HTN and Asthma who presents to the Emergency Department complaining of gradual onset, constant, moderate sore throat onset 3 days. Pain is exacerbated when swallowing. She reports associated rhinorrhea, diaphoresis, cough and nasal congestion. Reports sick contact from sister. She denies difficulty swallowing or breathing, pooling of secretions, stiff neck,  nausea, vomiting, diarrhea, abdominal pain and fever.  Past Medical History:  Diagnosis Date  . Asthma   . Congenital ventricular septal defect   . Diabetes mellitus without complication ([place])   . Heart murmur   . Obesity   . Pulmonary hypertension    mild   . Tricuspid regurgitation   . VSD (ventricular septal defect), perimembranous    Qp:Qs 1.7:1 by catheterization     Patient Active Problem List   Diagnosis Date Noted  . Breath shortness 04/16/2014  . Tricuspid regurgitation   . Asthma   . VSD (ventricular septal defect), perimembranous 09/03/2013  . Pulmonary hypertension 09/03/2013  . Essential hypertension 08/06/2013  . Diabetes mellitus ([place]) 08/06/2013  . Obesity 08/06/2013  . BP (high blood pressure) 07/27/2012  . Awareness of heartbeats 07/27/2012  . 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 Conoventricular VSD 07/27/2012    Past Surgical History:  Procedure Laterality Date  . CARDIAC CATHETERIZATION [name] 10/30/2014   Procedure: Right Heart Cath;  Surgeon:  [name], MD;  Location: [facility] INVASIVE CV LAB CUPID;  Service: Cardiovascular;  Laterality: [name];  . CHOLECYSTECTOMY    . CYST EXCISION    . LEFT AND RIGHT HEART CATHETERIZATION WITH CORONARY ANGIOGRAM [name] 09/11/2013   Procedure: LEFT AND RIGHT HEART CATHETERIZATION WITH CORONARY ANGIOGRAM;  Surgeon: [name], MD;  Location: [facility] CATH LAB;  Service: Cardiovascular;  Laterality: [name];  . TUBAL LIGATION      OB History    No data available       Home Medications    Prior to Admission medications   Medication Sig Start Date End Date Taking? Authorizing Provider  albuterol (PROVENTIL) (2.5 MG/3ML) 0.083% nebulizer solution Inhale 2.5mg  four times a day as needed for shortness of breath 10/07/15   Historical Provider, MD  Alogliptin-Metformin HCl (KAZANO) 12.10-998 MG TABS Take 1 tablet by mouth daily.     Historical Provider, MD  amLODipine (NORVASC) 5 MG tablet Take 5 mg by mouth daily.  08/19/14   Historical Provider, MD  docusate sodium (COLACE) 100 MG capsule Take 1 capsule (100 mg total) by mouth every 12 (twelve) hours. 03/31/16   Margette Fast, MD  HYDROcodone-acetaminophen (NORCO/VICODIN) 5-325 MG tablet Take 2 tablets by mouth every 4 (four) hours as needed. 03/31/16   Margette Fast, MD  ibuprofen (ADVIL,MOTRIN) 800 MG tablet Take 1 tablet (800 mg total) by mouth 3 (three) times daily. Patient taking differently: Take 800 mg by mouth every 8 (eight) hours as needed for moderate pain.  07/30/15   [name], PA-C  meloxicam (MOBIC) 15 MG tablet Take 1  tablet (15 mg total) by mouth daily. 03/10/16   Kalman Drape, PA  methocarbamol (ROBAXIN-750) 750 MG tablet Take 1 tablet (750 mg total) by mouth 4 (four) times daily. Patient not taking: Reported on 03/31/2016 07/13/15   [name], MD  metoCLOPramide (REGLAN) 10 MG tablet Take 1 tablet (10 mg total) by mouth every 8 (eight) hours as needed for nausea. Patient not taking: Reported on 03/31/2016 07/30/15   [name], PA-C    naproxen (NAPROSYN) 500 MG tablet Take 1 tablet (500 mg total) by mouth 2 (two) times daily. Patient not taking: Reported on 03/31/2016 01/09/16   Tanna Furry, MD  oxyCODONE (OXY IR/ROXICODONE) 5 MG immediate release tablet Take 5-10mg  by mouth every 4 hours as needed for pain. 11/14/15   Historical Provider, MD  PROAIR HFA 108 (90 Base) MCG/ACT inhaler Inhale 2 puffs into the lungs every 6 (six) hours as needed for wheezing or shortness of breath.  10/23/15   Historical Provider, MD  ranitidine (ZANTAC) 150 MG capsule Take 1 capsule (150 mg total) by mouth daily. Patient not taking: Reported on 03/31/2016 09/27/15   [name], MD    Family History Family History  Problem Relation Age of Onset  . Cancer Other   . Hyperlipidemia Other   . Hypertension Other   . Asthma Other   . Diabetes Mother   . Diabetes Father     Social History Social History  Substance Use Topics  . Smoking status: Never Smoker  . Smokeless tobacco: Never Used  . Alcohol use No     Allergies   Penicillins; Tape; Other; and Latex   Review of Systems Review of Systems  Constitutional: Positive for diaphoresis. Negative for fever.  HENT: Positive for congestion, rhinorrhea and sore throat.   Respiratory: Positive for cough.   Gastrointestinal: Negative for abdominal pain, diarrhea, nausea and vomiting.  All other systems reviewed and are negative.  Physical Exam Updated Vital Signs BP 168/99 (BP Location: Left Arm)   Pulse 95   Temp 98.4 F (36.9 C) (Oral)   Resp 18   Ht 4\' 11"  (1.499 m)   Wt 74.8 kg   LMP 04/27/2016   SpO2 100%   BMI 33.33 kg/m   Physical Exam  Constitutional: She is oriented to person, place, and time. She appears well-developed and well-nourished. She is active. No distress.  HENT:  Head: Normocephalic and atraumatic.  Right Ear: Tympanic membrane normal.  Left Ear: Tympanic membrane normal.  Mouth/Throat: Uvula is midline and oropharynx is clear and moist. No  oropharyngeal exudate, posterior oropharyngeal edema or posterior oropharyngeal erythema.  Eyes: Conjunctivae are normal.  Neck: Normal range of motion. Neck supple.  Cardiovascular: Normal rate.   Pulmonary/Chest: Effort normal and breath sounds normal. No respiratory distress. She has no wheezes. She has no rales.  Musculoskeletal: Normal range of motion.  Lymphadenopathy:    She has no cervical adenopathy.  Neurological: She is alert and oriented to person, place, and time.  Skin: Skin is warm and dry.  Psychiatric: She has a normal mood and affect. Her behavior is normal.  Nursing note and vitals reviewed.  ED Treatments / Results  DIAGNOSTIC STUDIES: Oxygen Saturation is 100% on RA, normal by my interpretation.    COORDINATION OF CARE:  Labs (all labs ordered are listed, but only abnormal results are displayed) Labs Reviewed  RAPID STREP SCREEN (NOT AT [facility])  CULTURE, GROUP A STREP [facility])    EKG  EKG Interpretation None  Radiology No results found.  Procedures Procedures (including critical care time)  Medications Ordered in ED Medications - No data to display   Initial Impression / Assessment and Plan / ED Course  I have reviewed the triage vital signs and the nursing notes.  Pertinent labs & imaging results that were available during my care of the patient were reviewed by me and considered in my medical decision making (see chart for details).  Clinical Course    45 year old female presents today with complaints of sore throat. The symptoms she describes are likely related to upper respiratory infection, she has no obvious swelling or edema of the throat. She is tolerating secretions without difficulty. Patient has no swelling of the neck, no signs of deep space infection. She is instructed to use ibuprofen or Tylenol as needed for discomfort, warm teas, follow up with primary care symptoms persist. She verbalized understanding and agreement to today's  plan had no further questions or concerns  I personally performed the services described in this documentation, which was scribed in my presence. The recorded information has been reviewed and is accurate.     Final Clinical Impressions(s) / ED Diagnoses   Final diagnoses:  Sore throat    New Prescriptions New Prescriptions   No medications on file     Okey Regal, PA-C 05/22/16 2106    Charlesetta Shanks, MD 05/23/16 [phone]

## 2016-05-22 NOTE — Discharge Instructions (Signed)
Please read attached information. If you experience any new or worsening signs or symptoms please return to the emergency room for evaluation. Please follow-up with your primary care provider or specialist as discussed.  °

## 2016-05-25 LAB — CULTURE, GROUP A STREP (THRC)

## 2016-05-29 ENCOUNTER — Emergency Department (HOSPITAL_COMMUNITY)
Admission: EM | Admit: 2016-05-29 | Discharge: 2016-05-29 | Disposition: A | Discharge status: Home / Self Care | Payer: Medicaid Other | Attending: Emergency Medicine | Admitting: Emergency Medicine

## 2016-05-29 ENCOUNTER — Encounter (HOSPITAL_COMMUNITY): Payer: Self-pay | Admitting: Emergency Medicine

## 2016-05-29 DIAGNOSIS — I10 Essential (primary) hypertension: Secondary | ICD-10-CM | POA: Diagnosis not present

## 2016-05-29 DIAGNOSIS — Z7984 Long term (current) use of oral hypoglycemic drugs: Secondary | ICD-10-CM | POA: Diagnosis not present

## 2016-05-29 DIAGNOSIS — R05 Cough: Secondary | ICD-10-CM | POA: Diagnosis present

## 2016-05-29 DIAGNOSIS — Z91040 Latex allergy status: Secondary | ICD-10-CM | POA: Insufficient documentation

## 2016-05-29 DIAGNOSIS — E119 Type 2 diabetes mellitus without complications: Secondary | ICD-10-CM | POA: Diagnosis not present

## 2016-05-29 DIAGNOSIS — B001 Herpesviral vesicular dermatitis: Secondary | ICD-10-CM

## 2016-05-29 DIAGNOSIS — B9789 Other viral agents as the cause of diseases classified elsewhere: Secondary | ICD-10-CM

## 2016-05-29 DIAGNOSIS — J069 Acute upper respiratory infection, unspecified: Secondary | ICD-10-CM | POA: Insufficient documentation

## 2016-05-29 DIAGNOSIS — J45909 Unspecified asthma, uncomplicated: Secondary | ICD-10-CM | POA: Diagnosis not present

## 2016-05-29 DIAGNOSIS — Z79899 Other long term (current) drug therapy: Secondary | ICD-10-CM | POA: Insufficient documentation

## 2016-05-29 MED ORDER — BENZONATATE 100 MG PO CAPS: 100.0000 mg | ORAL_CAPSULE | Freq: Three times a day (TID) | ORAL | 0 refills | Status: DC | PRN

## 2016-05-29 MED ORDER — ACYCLOVIR 400 MG PO TABS: 400.0000 mg | ORAL_TABLET | Freq: Three times a day (TID) | ORAL | 0 refills | Status: DC

## 2016-05-29 NOTE — Discharge Instructions (Signed)
Your lung sounds are clear and no chest x-ray or antibiotics are indicated, and you likely have a viral upper respiratory infection with cough. Take Tessalon as prescribed for your cough, and acyclovir to relieve your "fever blister." Follow-up with your PCP this week for reevaluation.

## 2016-05-29 NOTE — ED Triage Notes (Signed)
Patient states that she was seen last week for same symptoms and was told if not getting better to come back. Patient c/o productive cough with yellow phlegm. Patient took Mucinex which didn't help. Patient also c/o cold sores on lip and now inside mouth.

## 2016-05-29 NOTE — ED Provider Notes (Signed)
[facility] DEPT Provider Note   CSN: [id number] Arrival date & time: 05/29/16  0711     History   Chief Complaint Chief Complaint  Patient presents with  . Cough  . Mouth Lesions    HPI Mackenzie Patterson is a 45 y.o. female.  Patient is a 45 year old female with past medical history of asthma, diabetes, pulmonary hypertension, and heart murmur, presenting with chief complaint of persistent cough for the past week and oral lesion that developed 2 days ago. Patient was seen on 11/26 in ED for complaint of sore throat. Sore throat has resolved, but she developed a cough productive of yellow sputum shortly afterwards. She denies any fevers, chills, chest pain, palpitations, shortness of breath, wheezing, or chest tightness. She tried taking Mucinex but it did not resolve cough. She also developed "fever blisters" on her upper lip. She tried phenol lip balm, but it did not provide relief. She states she's developed these sores in the past with colds. She has no other complaints. She does see a regular PCP.      Past Medical History:  Diagnosis Date  . Asthma   . Congenital ventricular septal defect   . Diabetes mellitus without complication ([place])   . Heart murmur   . Obesity   . Pulmonary hypertension    mild   . Tricuspid regurgitation   . VSD (ventricular septal defect), perimembranous    Qp:Qs 1.7:1 by catheterization     Patient Active Problem List   Diagnosis Date Noted  . Breath shortness 04/16/2014  . Tricuspid regurgitation   . Asthma   . VSD (ventricular septal defect), perimembranous 09/03/2013  . Pulmonary hypertension 09/03/2013  . Essential hypertension 08/06/2013  . Diabetes mellitus ([place]) 08/06/2013  . Obesity 08/06/2013  . BP (high blood pressure) 07/27/2012  . Awareness of heartbeats 07/27/2012  . 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 Conoventricular VSD 07/27/2012    Past Surgical History:  Procedure Laterality Date  . CARDIAC  CATHETERIZATION [name] 10/30/2014   Procedure: Right Heart Cath;  Surgeon: [name], MD;  Location: [facility] INVASIVE CV LAB CUPID;  Service: Cardiovascular;  Laterality: [name];  . CHOLECYSTECTOMY    . CYST EXCISION    . LEFT AND RIGHT HEART CATHETERIZATION WITH CORONARY ANGIOGRAM [name] 09/11/2013   Procedure: LEFT AND RIGHT HEART CATHETERIZATION WITH CORONARY ANGIOGRAM;  Surgeon: [name], MD;  Location: [facility] CATH LAB;  Service: Cardiovascular;  Laterality: [name];  . TUBAL LIGATION      OB History    No data available       Home Medications    Prior to Admission medications   Medication Sig Start Date End Date Taking? Authorizing Provider  acyclovir (ZOVIRAX) 400 MG tablet Take 1 tablet (400 mg total) by mouth 3 (three) times daily. 05/29/16   Elaina Cara F de [place] II, PA  albuterol (PROVENTIL) (2.5 MG/3ML) 0.083% nebulizer solution Inhale 2.5mg  four times a day as needed for shortness of breath 10/07/15   Historical Provider, MD  Alogliptin-Metformin HCl (KAZANO) 12.10-998 MG TABS Take 1 tablet by mouth daily.     Historical Provider, MD  amLODipine (NORVASC) 5 MG tablet Take 5 mg by mouth daily.  08/19/14   Historical Provider, MD  benzonatate (TESSALON) 100 MG capsule Take 1 capsule (100 mg total) by mouth 3 (three) times daily as needed for cough. 05/29/16   Donelda Mailhot F de Vill[place] II, PA  docusate sodium (COLACE) 100 MG capsule Take 1 capsule (100 mg total) by mouth  every 12 (twelve) hours. 03/31/16   Margette Fast, MD  HYDROcodone-acetaminophen (NORCO/VICODIN) 5-325 MG tablet Take 2 tablets by mouth every 4 (four) hours as needed. 03/31/16   Margette Fast, MD  ibuprofen (ADVIL,MOTRIN) 800 MG tablet Take 1 tablet (800 mg total) by mouth 3 (three) times daily. Patient taking differently: Take 800 mg by mouth every 8 (eight) hours as needed for moderate pain.  07/30/15   [name], PA-C  meloxicam (MOBIC) 15 MG tablet Take 1 tablet (15 mg total) by mouth daily. 03/10/16   Kalman Drape, PA    methocarbamol (ROBAXIN-750) 750 MG tablet Take 1 tablet (750 mg total) by mouth 4 (four) times daily. Patient not taking: Reported on 03/31/2016 07/13/15   [name], MD  metoCLOPramide (REGLAN) 10 MG tablet Take 1 tablet (10 mg total) by mouth every 8 (eight) hours as needed for nausea. Patient not taking: Reported on 03/31/2016 07/30/15   [name], PA-C  naproxen (NAPROSYN) 500 MG tablet Take 1 tablet (500 mg total) by mouth 2 (two) times daily. Patient not taking: Reported on 03/31/2016 01/09/16   Tanna Furry, MD  oxyCODONE (OXY IR/ROXICODONE) 5 MG immediate release tablet Take 5-10mg  by mouth every 4 hours as needed for pain. 11/14/15   Historical Provider, MD  PROAIR HFA 108 (90 Base) MCG/ACT inhaler Inhale 2 puffs into the lungs every 6 (six) hours as needed for wheezing or shortness of breath.  10/23/15   Historical Provider, MD  ranitidine (ZANTAC) 150 MG capsule Take 1 capsule (150 mg total) by mouth daily. Patient not taking: Reported on 03/31/2016 09/27/15   [name], MD    Family History Family History  Problem Relation Age of Onset  . Cancer Other   . Hyperlipidemia Other   . Hypertension Other   . Asthma Other   . Diabetes Mother   . Diabetes Father     Social History Social History  Substance Use Topics  . Smoking status: Never Smoker  . Smokeless tobacco: Never Used  . Alcohol use No     Allergies   Penicillins; Tape; Other; and Latex   Review of Systems Review of Systems  Constitutional: Negative for chills and fever.  HENT: Positive for mouth sores. Negative for dental problem, drooling, ear pain, facial swelling, sore throat, trouble swallowing and voice change.   Eyes: Negative for pain and visual disturbance.  Respiratory: Positive for cough. Negative for chest tightness, shortness of breath and wheezing.   Cardiovascular: Negative for chest pain, palpitations and leg swelling.  Musculoskeletal: Negative for neck pain and neck stiffness.  Skin:  Negative for color change and rash.  Neurological: Negative for headaches.  Hematological: Negative for adenopathy.     Physical Exam Updated Vital Signs BP 146/92 (BP Location: Right Arm)   Pulse 90   Temp 97.9 F (36.6 C) (Oral)   Resp 18   LMP 04/27/2016   SpO2 100%   Physical Exam  Constitutional: She appears well-developed and well-nourished. No distress.  HENT:  Head: Normocephalic and atraumatic.  Mouth/Throat: Oropharynx is clear and moist.  Uvula midline, no trismus. Dentition normal, no dental abscess. Pale gray ulcerated lesion noted to inside of upper lip, with small cracked blisters along vermilion border. No oropharyngeal exudate, erythema, or edema. No tonsillar abscess or exudate.  Eyes: Conjunctivae are normal.  Neck: Normal range of motion. Neck supple.  Cardiovascular: Normal rate, regular rhythm and intact distal pulses.   Murmur heard. Pulmonary/Chest: Effort normal and  breath sounds normal. No respiratory distress. She has no wheezes. She has no rales. She exhibits no tenderness.  Abdominal: Soft. There is no tenderness.  Musculoskeletal: Normal range of motion. She exhibits no edema or tenderness.  Lymphadenopathy:    She has no cervical adenopathy.  Neurological: She is alert.  Skin: Skin is warm and dry.  Psychiatric: She has a normal mood and affect.  Nursing note and vitals reviewed.    ED Treatments / Results  Labs (all labs ordered are listed, but only abnormal results are displayed) Labs Reviewed - No data to display  EKG  EKG Interpretation None       Radiology No results found.  Procedures Procedures (including critical care time)  Medications Ordered in ED Medications - No data to display   Initial Impression / Assessment and Plan / ED Course  I have reviewed the triage vital signs and the nursing notes.  Pertinent labs & imaging results that were available during my care of the patient were reviewed by me and  considered in my medical decision making (see chart for details).  Clinical Course    Patient is a 45 yo F presenting with cough and ulcerated lesions to upper lip. She is afebrile with clear breath sounds bilaterally. Likely viral URI with cough, and provided with prescription for Tessalon. Ulcerated lesions have clinical appearance of herpes labialis, and provided with prescription for acyclovir. Stable for d/c home with instructions to f/u with PCP or return to ED for new or worsening symptoms.  Final Clinical Impressions(s) / ED Diagnoses   Final diagnoses:  Viral URI with cough  Herpes labialis    New Prescriptions Discharge Medication List as of 05/29/2016  8:13 AM    START taking these medications   Details  acyclovir (ZOVIRAX) 400 MG tablet Take 1 tablet (400 mg total) by mouth 3 (three) times daily., Starting Sun 05/29/2016, Print    benzonatate (TESSALON) 100 MG capsule Take 1 capsule (100 mg total) by mouth 3 (three) times daily as needed for cough., Starting Sun 05/29/2016, Print         Keily Lepp F de Ladora II, [place] 05/31/16 1925    Charlesetta Shanks, MD 06/08/16 [phone]

## 2016-06-27 HISTORY — PX: CHOLECYSTECTOMY: SHX55

## 2016-08-03 ENCOUNTER — Encounter: Payer: Self-pay | Admitting: Internal Medicine

## 2016-08-03 ENCOUNTER — Ambulatory Visit (INDEPENDENT_AMBULATORY_CARE_PROVIDER_SITE_OTHER): Payer: Medicaid Other | Admitting: Internal Medicine

## 2016-08-03 VITALS — BP 132/94 | HR 91 | Ht 59.0 in | Wt 169.0 lb

## 2016-08-03 DIAGNOSIS — R05 Cough: Secondary | ICD-10-CM | POA: Diagnosis not present

## 2016-08-03 DIAGNOSIS — R0789 Other chest pain: Secondary | ICD-10-CM

## 2016-08-03 DIAGNOSIS — R0689 Other abnormalities of breathing: Secondary | ICD-10-CM

## 2016-08-03 DIAGNOSIS — R0600 Dyspnea, unspecified: Secondary | ICD-10-CM

## 2016-08-03 DIAGNOSIS — R059 Cough, unspecified: Secondary | ICD-10-CM

## 2016-08-03 LAB — NITRIC OXIDE: Nitric Oxide: 12

## 2016-08-03 MED ORDER — FLUTICASONE FUROATE 200 MCG/ACT IN AEPB: 1.0000 | INHALATION_SPRAY | Freq: Every day | RESPIRATORY_TRACT | 0 refills | Status: DC

## 2016-08-03 NOTE — Patient Instructions (Addendum)
ICD-9-CM ICD-10-CM   1. Dyspnea and respiratory abnormality 786.09 R06.00     R06.89   2. Chest tightness 786.59 R07.89   3. Cough 786.2 R05    I think is related to VSD Doubt asthma based on history and normal nitric oxide testing  Plan   - stil try empiric inhaled steroid - take 4 week sample of what we have - take daily  Followup 3-4 weeks with me or an app to see response - if it works, we can prescribe it. If not, stop it

## 2016-08-03 NOTE — Progress Notes (Signed)
Subjective:    [name], female    DOB: February 19, 1971, 46 y.o.   MRN: [id number] PCP [name], MD  HPI  46 year old 58 female who is had congenital ventricular septal defect since birth. This was not provided but according to history because the parents were advised that it would close by itself but it did not. Then starting some greater than 30 years ago when she was in middle school started noticing dyspnea on exertion with track and field associated with syncope at that time.. She says that it was then attributed to her VSD. Then approximately 21 years ago she gave birth to twins and after that her VSD symptoms of dyspnea on exertion but worse. Since then she's had slow insidious progressive worsening of dyspnea. A year ago she could do some bowling but now is restricted. Also for the last year or 2 she's had some nocturnal chest tightness and cough associated with dyspnea. The cough is dry. Symptom terms of cough and chest tightness and mild to moderate in severity. Her last syncopal spell with exertion was over a year ago when she was walking out of the bathroom. She says all the symptoms have largely been admitted to her VSD.  Echo done 03/21/2014 shows significant pulmonary arterial hypertension with a pulmonary artery systolic pressure of > 70 mmHg but normal left ventricular ejection fraction. There is also restrictive membranous VSD with the elevated gradient. This was followed by cardiac catheterization in May 2016 that showed mean pulmonary artery pressure of 38  mmHg and a VSD with right-to-left shunt. A VSD repair was recommended by Dr. Peter Martinique based on my review and summarization of the chart.  After that she followed up with [facility] cardiology and because of the risk of keloid she refused to have surgery. She confirms that she is against open-heart surgery. In the interim she's had progressive worsening of pulmonary hypertension.  The question now is  that if the symptoms could be related asthma in which case she could have any defects within inhaler. She denies any allergy symptoms. No previous diagnosis of asthma. No hives. No known eosinophilia. No known elevated IgE level although this has not been checked.  Asthma control questionnaire shows that she wakes up a few times at night because of perceived asthma symptoms. When she wakes up she has very mild symptoms. She is very limited in her activities because of perceived asthma. She gets dyspneic a very great deal but there is no wheezing at all. She does use albuterol for rescue one or 2 times.   Asthma Control Panel 08/03/2016   Current Med Regimen Pro airprn  ACQ 5 point- 1 week. wtd avg score. <1.0 is good control 0.75-1.25 is grey zone. >1.25 poor control. Delta 0.5 is clinically meaningful 2.6  FeNO ppB 12ppb and normal  FeV1    Planned intervention  for visit    Review of the chart shows last blood work was in 03/31/2016 with a normal creatinine of 0.85 mg percent. Mild anemia 11.3 g percent. Urine pregnancy test September 2017 was negative. There is no eosinophil check IgE check a blood allergy panel.  Chest x-ray done 03/31/2016 that I personally visualized shows stable and clear lung fields but she does have. Interestingly this dates back to 2013 chest x-rays   has a past medical history of Asthma; Congenital ventricular septal defect; Diabetes mellitus without complication ([place]); Heart murmur; Obesity; Pulmonary hypertension; Tricuspid regurgitation; and VSD (ventricular septal defect), perimembranous.  reports that she has never smoked. She has never used smokeless tobacco.  Past Surgical History:  Procedure Laterality Date  . CARDIAC CATHETERIZATION [name] 10/30/2014   Procedure: Right Heart Cath;  Surgeon: [name], MD;  Location: [facility] INVASIVE CV LAB CUPID;  Service: Cardiovascular;  Laterality: [name];  . CHOLECYSTECTOMY    . CYST EXCISION    . LEFT AND RIGHT HEART  CATHETERIZATION WITH CORONARY ANGIOGRAM [name] 09/11/2013   Procedure: LEFT AND RIGHT HEART CATHETERIZATION WITH CORONARY ANGIOGRAM;  Surgeon: [name], MD;  Location: [facility] CATH LAB;  Service: Cardiovascular;  Laterality: [name];  . TUBAL LIGATION      Allergies  Allergen Reactions  . Penicillins Anaphylaxis    Has patient had a PCN reaction causing immediate rash, facial/tongue/throat swelling, SOB or lightheadedness with hypotension: Yes Has patient had a PCN reaction causing severe rash involving mucus membranes or skin necrosis: Yes Has patient had a PCN reaction that required hospitalization Yes- went to ED, was not admitted Has patient had a PCN reaction occurring within the last 10 years: No- longer then 10 years If all of the above answers are "NO", then may proceed with Cephalosporin use.   . Tape Rash    Other reaction(s): Other (See Comments) Burns skin  . Other Rash    Other reaction(s): Other (See Comments) **EKG GEL PADS**  "Burns my skin" Pt states she is allergic to a blood pressure medication, unsure.  **EKG GEL PADS**  "Burns my skin"  . Latex Rash     There is no immunization history on file for this patient.  Family History  Problem Relation Age of Onset  . Cancer Other   . Hyperlipidemia Other   . Hypertension Other   . Asthma Other   . Diabetes Mother   . Diabetes Father      Current Outpatient Prescriptions:  .  acyclovir (ZOVIRAX) 400 MG tablet, Take 1 tablet (400 mg total) by mouth 3 (three) times daily., Disp: 21 tablet, Rfl: 0 .  albuterol (PROVENTIL) (2.5 MG/3ML) 0.083% nebulizer solution, Inhale 2.5mg  four times a day as needed for shortness of breath, Disp: , Rfl: 12 .  Alogliptin-Metformin HCl (KAZANO) 12.10-998 MG TABS, Take 1 tablet by mouth daily. , Disp: , Rfl:  .  amLODipine (NORVASC) 5 MG tablet, Take 5 mg by mouth daily. , Disp: , Rfl: 4 .  HYDROcodone-acetaminophen (NORCO/VICODIN) 5-325 MG tablet, Take 2 tablets by mouth every 4  (four) hours as needed., Disp: 10 tablet, Rfl: 0 .  ibuprofen (ADVIL,MOTRIN) 800 MG tablet, Take 1 tablet (800 mg total) by mouth 3 (three) times daily. (Patient taking differently: Take 800 mg by mouth every 8 (eight) hours as needed for moderate pain. ), Disp: 21 tablet, Rfl: 0 .  oxyCODONE (OXY IR/ROXICODONE) 5 MG immediate release tablet, Take 5-10mg  by mouth every 4 hours as needed for pain., Disp: , Rfl: 0 .  PROAIR HFA 108 (90 Base) MCG/ACT inhaler, Inhale 2 puffs into the lungs every 6 (six) hours as needed for wheezing or shortness of breath. , Disp: , Rfl: 12   Review of Systems  Constitutional: Negative for fever and unexpected weight change.  HENT: Negative for congestion, dental problem, ear pain, nosebleeds, postnasal drip, rhinorrhea, sinus pressure, sneezing, sore throat and trouble swallowing.   Eyes: Negative for redness and itching.  Respiratory: Positive for shortness of breath. Negative for cough, chest tightness and wheezing.   Cardiovascular: Negative for palpitations and leg swelling.  Gastrointestinal: Negative for  nausea and vomiting.  Genitourinary: Negative for dysuria.  Musculoskeletal: Negative for joint swelling.  Skin: Negative for rash.  Neurological: Negative for headaches.  Hematological: Does not bruise/bleed easily.  Psychiatric/Behavioral: Negative for dysphoric mood. The patient is not nervous/anxious.        Objective:   Physical Exam  Constitutional: She is oriented to person, place, and time. She appears well-developed and well-nourished. No distress.  HENT:  Head: Normocephalic and atraumatic.  Right Ear: External ear normal.  Left Ear: External ear normal.  Mouth/Throat: Oropharynx is clear and moist. No oropharyngeal exudate.  Eyes: Conjunctivae and EOM are normal. Pupils are equal, round, and reactive to light. Right eye exhibits no discharge. Left eye exhibits no discharge. No scleral icterus.  Neck: Normal range of motion. Neck supple. No  JVD present. No tracheal deviation present. No thyromegaly present.  Cardiovascular: Normal rate, regular rhythm and intact distal pulses.  Exam reveals no gallop and no friction rub.   Murmur heard. Significant systolic murmur present  Pulmonary/Chest: Effort normal and breath sounds normal. No respiratory distress. She has no wheezes. She has no rales. She exhibits no tenderness.  Abdominal: Soft. Bowel sounds are normal. She exhibits no distension and no mass. There is no tenderness. There is no rebound and no guarding.  Musculoskeletal: Normal range of motion. She exhibits no edema or tenderness.  Lymphadenopathy:    She has no cervical adenopathy.  Neurological: She is alert and oriented to person, place, and time. She has normal reflexes. No cranial nerve deficit. She exhibits normal muscle tone. Coordination normal.  Skin: Skin is warm and dry. No rash noted. She is not diaphoretic. No erythema. No pallor.  Psychiatric: She has a normal mood and affect. Her behavior is normal. Judgment and thought content normal.  Vitals reviewed.   Vitals:   08/03/16 1102  BP: (!) 132/94  Pulse: 91  SpO2: 97%  Weight: 169 lb (76.7 kg)  Height: 4\' 11"  (1.499 m)   Estimated body mass index is 34.13 kg/m as calculated from the following:   Height as of this encounter: 4\' 11"  (1.499 m).   Weight as of this encounter: 169 lb (76.7 kg).       Assessment & Plan:     ICD-9-CM ICD-10-CM   1. Dyspnea and respiratory abnormality 786.09 R06.00     R06.89   2. Chest tightness 786.59 R07.89   3. Cough 786.2 R05      I think is related to VSD Doubt asthma based on history and normal nitric oxide testing  Plan   - stil try empiric inhaled steroid - take 4 week sample of what we have - take daily  Followup 3-4 weeks with me or an app to see response - if it works, we can prescribe it. If not, stop it   Dr. Brand Males, M.D., [facility].C.P Pulmonary and Critical Care Medicine Staff  Physician [facility] Pager: [phone], If no answer or between  15:00h - 7:00h: call [phone]  08/03/2016 11:45 AM

## 2016-08-03 NOTE — Addendum Note (Signed)
Addended by: Collier Salina on: 08/03/2016 12:52 PM   Modules accepted: Orders

## 2016-08-03 NOTE — Addendum Note (Signed)
Addended by: Collier Salina on: 08/03/2016 12:50 PM   Modules accepted: Orders

## 2016-08-16 ENCOUNTER — Ambulatory Visit: Payer: Medicaid Other | Admitting: Physician Assistant

## 2016-08-16 NOTE — Progress Notes (Unsigned)
Cardiology Office Note:    Date:  08/16/2016   ID:  Mackenzie Patterson, DOB [birth year], MRN [id number]  PCP:  [name], MD  Cardiologist:  Dr. Ena Dawley   Electrophysiologist:  ***  Referring MD: Wallene Huh, MD   No chief complaint on file. ***  History of Present Illness:    Mackenzie Patterson is a 46 y.o. female with a hx of congenital membranous VSD, pulmonary HTN, obesity, asthma, HTN, DM.  She was last seen by Dr. Ena Dawley in 4/16.  RHC was done in 5/16 and demonstrated mod pulmonary HTN.  PASP on Echo in 9/15 was 74.  She was referred for VSD repair.  She has been followed at [facility] since last seen here.  Review of the records available indicates repair of the VSD was recommended but the patient was hesitant to proceed.  Last seen at [facility] by Dr. Jeani Hawking Ward in 3/17.    Prior CV studies:   The following studies were reviewed today:  Echo 3/17 ([facility]) NORMAL LEFT VENTRICULAR SYSTOLIC FUNCTION NORMAL LA PRESSURES WITH NORMAL DIASTOLIC FUNCTION NORMAL RIGHT VENTRICULAR SYSTOLIC FUNCTION VALVULAR REGURGITATION: TRIVIAL MR, TRIVIAL PR NO VALVULAR STENOSIS TRIVIAL PERICARDIAL EFFUSION PERIMEMBRANOUSVSD ( 6.3m/sec, 146mmHg); BP= 136/88 SOME TR PRESENT-UNABLE TO QUANTIFY DUE TO OVERLAPPING VSD FLOW ESTIMATED PA DIASTOLIC [id number] 3D acquisition and reconstructions were performed as part of this examination to more accurately quantify the effects of identified structural abnormalities as part of the exam.  RHC 5/16  Moderate pulmonary HTN  PVR of 4.14 Woods units  VSD with left to right shunt.  Qp 5.79 L/min  Qs 4.01 L/min  shunt ratio 1.44 VSD with shunt ratio 1.44/1 Moderate pulmonary HTN with PVR of 4 Woods units. Compared to prior cardiac cath in March 2015 there has been significant increase in pulmonary pressures with increase in PVR from 2 to 4.1 Woods units.  Recommendations: Consider surgical repair of VSD  Echo 9/15 EF  50-55, no RWMA, Gr 1 DD, restrictive membranous VSD - peak gradient 153 mmHg, mild RAE, mod TR, PASP 74, trivial eff  Echo 5/15 EF 60-65, normal wall motion, restrictive perimembranous VSD-peak 149, trivial AI, trivial TR, PASP 32, trivial effusion  R/L heart cath 3/15 Normal coronary arteries PA 39/18, mean 27 Qp:Qs - 1.7:1 Normal R heart pressures; minimal pulmonary HTN  Event Monitor 2/15 PACs  Myoview 6/14 IMPRESSION: No evidence for pharmacologically induced ischemia. Normal wall motion and calculated ejection fraction is 62%.  Past Medical History:  Diagnosis Date  . Asthma   . Congenital ventricular septal defect   . Diabetes mellitus without complication ([place])   . Heart murmur   . Obesity   . Pulmonary hypertension    mild   . Tricuspid regurgitation   . VSD (ventricular septal defect), perimembranous    Qp:Qs 1.7:1 by catheterization     Past Surgical History:  Procedure Laterality Date  . CARDIAC CATHETERIZATION [name] 10/30/2014   Procedure: Right Heart Cath;  Surgeon: [name], MD;  Location: [facility] INVASIVE CV LAB CUPID;  Service: Cardiovascular;  Laterality: [name];  . CHOLECYSTECTOMY    . CYST EXCISION    . LEFT AND RIGHT HEART CATHETERIZATION WITH CORONARY ANGIOGRAM [name] 09/11/2013   Procedure: LEFT AND RIGHT HEART CATHETERIZATION WITH CORONARY ANGIOGRAM;  Surgeon: [name], MD;  Location: [facility] CATH LAB;  Service: Cardiovascular;  Laterality: [name];  . TUBAL LIGATION      Current Medications: No outpatient prescriptions have been marked as taking for the 08/16/16  encounter (Appointment) with Liliane Shi, PA-C.     Allergies:   Penicillins; Tape; Other; and Latex   Social History   Social History  . Marital status: Single    Spouse name: [name]  . Number of children: [name]  . Years of education: [name]   Social History Main Topics  . Smoking status: Never Smoker  . Smokeless tobacco: Never Used  . Alcohol use No  . Drug use: No  . Sexual activity:  Yes   Other Topics Concern  . Not on file   Social History Narrative   Lives with boyfriend. On disability.      Family History  Problem Relation Age of Onset  . Cancer Other   . Hyperlipidemia Other   . Hypertension Other   . Asthma Other   . Diabetes Mother   . Diabetes Father      ROS:   Please see the history of present illness.    ROS All other systems reviewed and are negative.   EKGs/Labs/Other Test Reviewed:    EKG:  EKG is *** ordered today.  The ekg ordered today demonstrates ***  Recent Labs: 09/27/2015: ALT 10 03/31/2016: BUN 13; Creatinine, Ser 0.85; Hemoglobin 11.3; Platelets 201; Potassium 4.1; Sodium 135   Recent Lipid Panel    Component Value Date/Time   CHOL 149 12/08/2012 0615   TRIG 57 12/08/2012 0615   HDL 41 12/08/2012 0615   CHOLHDL 3.6 12/08/2012 0615   VLDL 11 12/08/2012 0615   LDLCALC 97 12/08/2012 0615     Physical Exam:    VS:  There were no vitals taken for this visit.    Wt Readings from Last 3 Encounters:  08/03/16 169 lb (76.7 kg)  05/22/16 165 lb (74.8 kg)  03/10/16 165 lb (74.8 kg)     ***Physical Exam  ASSESSMENT:    No diagnosis found. PLAN:    In order of problems listed above:  ***   Medication Adjustments/Labs and Tests Ordered: Current medicines are reviewed at length with the patient today.  Concerns regarding medicines are outlined above.  Medication changes, Labs and Tests ordered today are outlined in the Patient Instructions noted below. There are no Patient Instructions on file for this visit.  No Follow-up on file.   Signed, Richardson Dopp, PA-C  08/16/2016 9:53 AM    [facility] [place], [place], [place]  [address] Phone: [phone]; Fax: [phone]

## 2016-08-23 ENCOUNTER — Encounter: Payer: Self-pay | Admitting: *Deleted

## 2016-08-23 ENCOUNTER — Ambulatory Visit (INDEPENDENT_AMBULATORY_CARE_PROVIDER_SITE_OTHER): Payer: Medicaid Other | Admitting: Cardiology

## 2016-08-23 ENCOUNTER — Encounter: Payer: Self-pay | Admitting: Cardiology

## 2016-08-23 VITALS — BP 130/80 | HR 88 | Ht 59.0 in | Wt 171.1 lb

## 2016-08-23 DIAGNOSIS — I270 Primary pulmonary hypertension: Secondary | ICD-10-CM | POA: Diagnosis not present

## 2016-08-23 DIAGNOSIS — I10 Essential (primary) hypertension: Secondary | ICD-10-CM | POA: Diagnosis not present

## 2016-08-23 DIAGNOSIS — Q210 Ventricular septal defect: Secondary | ICD-10-CM

## 2016-08-23 NOTE — Patient Instructions (Signed)
Medication Instructions:  Your physician recommends that you continue on your current medications as directed. Please refer to the Current Medication list given to you today.   Labwork: FUTURE:  BMET, CBC, & PT/INR  Testing/Procedures: Your physician has requested that you have a cardiac catheterization. Cardiac catheterization is used to diagnose and/or treat various heart conditions. Doctors may recommend this procedure for a number of different reasons. The most common reason is to evaluate chest pain. Chest pain can be a symptom of coronary artery disease (CAD), and cardiac catheterization can show whether plaque is narrowing or blocking your heart's arteries. This procedure is also used to evaluate the valves, as well as measure the blood flow and oxygen levels in different parts of your heart. For further information please visit [email or web address]. Please follow instruction sheet, as given.    Follow-Up: Your physician recommends that you schedule a follow-up appointment in: WILL BE SET UP AT DISCHARGE   Any Other Special Instructions Will Be Listed Below (If Applicable).   Coronary Angiogram With Stent Coronary angiogram with stent placement is a procedure to widen or open a narrow blood vessel of the heart (coronary artery). Arteries may become blocked by cholesterol buildup (plaques) in the lining or wall. When a coronary artery becomes partially blocked, blood flow to that area decreases. This may lead to chest pain or a heart attack (myocardial infarction). A stent is a small piece of metal that looks like mesh or a spring. Stent placement may be done as treatment for a heart attack or right after a coronary angiogram in which a blocked artery is found. Let your health care provider know about:  Any allergies you have.  All medicines you are taking, including vitamins, herbs, eye drops, creams, and over-the-counter medicines.  Any problems you or family members have had with  anesthetic medicines.  Any blood disorders you have.  Any surgeries you have had.  Any medical conditions you have.  Whether you are pregnant or may be pregnant. What are the risks? Generally, this is a safe procedure. However, problems may occur, including:  Damage to the heart or its blood vessels.  A return of blockage.  Bleeding, infection, or bruising at the insertion site.  A collection of blood under the skin (hematoma) at the insertion site.  A blood clot in another part of the body.  Kidney injury.  Allergic reaction to the dye or contrast that is used.  Bleeding into the abdomen (retroperitoneal bleeding). What happens before the procedure? Staying hydrated  Follow instructions from your health care provider about hydration, which may include:  Up to 2 hours before the procedure - you may continue to drink clear liquids, such as water, clear fruit juice, black coffee, and plain tea. Eating and drinking restrictions  Follow instructions from your health care provider about eating and drinking, which may include:  8 hours before the procedure - stop eating heavy meals or foods such as meat, fried foods, or fatty foods.  6 hours before the procedure - stop eating light meals or foods, such as toast or cereal.  2 hours before the procedure - stop drinking clear liquids. Ask your health care provider about:  Changing or stopping your regular medicines. This is especially important if you are taking diabetes medicines or blood thinners.  Taking medicines such as ibuprofen. These medicines can thin your blood. Do not take these medicines before your procedure if your health care provider instructs you not to. Generally, aspirin is recommended  before a procedure of passing a small, thin tube (catheter) through a blood vessel and into the heart (cardiac catheterization). What happens during the procedure?  An IV tube will be inserted into one of your veins.  You will  be given one or more of the following:  A medicine to help you relax (sedative).  A medicine to numb the area where the catheter will be inserted into an artery (local anesthetic).  To reduce your risk of infection:  Your health care team will wash or sanitize their hands.  Your skin will be washed with soap.  Hair may be removed from the area where the catheter will be inserted.  Using a guide wire, the catheter will be inserted into an artery. The location may be in your groin, in your wrist, or in the fold of your arm (near your elbow).  A type of X-ray (fluoroscopy) will be used to help guide the catheter to the opening of the arteries in the heart.  A dye will be injected into the catheter, and X-rays will be taken. The dye will help to show where any narrowing or blockages are located in the arteries.  A tiny wire will be guided to the blocked spot, and a balloon will be inflated to make the artery wider.  The stent will be expanded and will crush the plaques into the wall of the vessel. The stent will hold the area open and improve the blood flow. Most stents have a drug coating to reduce the risk of the stent narrowing over time.  The artery may be made wider using a drill, laser, or other tools to remove plaques.  When the blood flow is better, the catheter will be removed. The lining of the artery will grow over the stent, which stays where it was placed. This procedure may vary among health care providers and hospitals. What happens after the procedure?  If the procedure is done through the leg, you will be kept in bed lying flat for about 6 hours. You will be instructed to not bend and not cross your legs.  The insertion site will be checked frequently.  The pulse in your foot or wrist will be checked frequently.  You may have additional blood tests, X-rays, and a test that records the electrical activity of your heart (electrocardiogram, or ECG). This information is  not intended to replace advice given to you by your health care provider. Make sure you discuss any questions you have with your health care provider. Document Released: 12/18/2002 Document Revised: 02/11/2016 Document Reviewed: 01/17/2016 Elsevier Interactive Patient Education  2017 [employer].    If you need a refill on your cardiac medications before your next appointment, please call your pharmacy.

## 2016-08-23 NOTE — Progress Notes (Signed)
08/23/2016 [name]   [birth year]  [id number]  Primary Physician [name], MD Primary Cardiologist: Dr. Meda Coffee [facility]    Reason for Visit/CC: F/u for VSD; PCP referral for [place] Referring MD: Dr. Ardyth Gal. Spruill, (Cardiology/Internal Medicine)  HPI:  A 46 year old AA female with prior medical history of hypertension, diabetes mellitus, obesity and history of congenitial membranous ventricular septal defect. She has been followed by Dr. Meda Coffee. Also followed by Dr. Montez Morita (cardiologist/ PCP). She had been referred, in the past, to [facility] where she was offered an option of surgical closure of her VSD or medical management and she decided not to undergo surgery. She continues to be followed at [facility]. She has opted to have periodic RHCs to monitor her VSD. She reports that she is now reconsidering surgery. She will agree to repair if her cath measurements have increased in severity, compared previous study. She had a R/LHC in 2015 that showed normal coronaries. Her last cath in 2016 was a RHC only. Results are as follows: RHC 10/30/14 Conclusion    Moderate pulmonary HTN  PVR of 4.14 Woods units  VSD with left to right shunt.  Qp 5.79 L/min  Qs 4.01 L/min  shunt ratio 1.44   VSD with shunt ratio 1.44/1 Moderate pulmonary HTN with PVR of 4 Woods units.  Compared to prior cardiac cath in March 2015 there has been significant increase in pulmonary pressures with increase in PVR from 2 to 4.1 Woods units.    She reports that her symptoms have worsened. She notes more frequent palpitations and exertional dyspnea. She notes CP that is atypical. Sharp in nature. Her PCP, Dr. Montez Morita, recently ordered a 48 hr Holter that showed no significant arrhthymias. She saw Dr. Montez Morita on 08/16/16 who recommended that she f/u in our office to get set up for repeat [place].    Current Meds  Medication Sig  . Alogliptin-Metformin HCl (KAZANO) 12.10-998 MG TABS Take 1 tablet by mouth daily.    Marland Kitchen amLODipine (NORVASC) 5 MG tablet Take 5 mg by mouth daily.   . [DISCONTINUED] acyclovir (ZOVIRAX) 400 MG tablet Take 1 tablet (400 mg total) by mouth 3 (three) times daily.  . [DISCONTINUED] albuterol (PROVENTIL) (2.5 MG/3ML) 0.083% nebulizer solution Inhale 2.5mg  four times a day as needed for shortness of breath  . [DISCONTINUED] atorvastatin (LIPITOR) 10 MG tablet TK 1 T PO QAM  . [DISCONTINUED] Fluticasone Furoate (ARNUITY ELLIPTA) 200 MCG/ACT AEPB Inhale 1 puff into the lungs daily.  . [DISCONTINUED] HYDROcodone-acetaminophen (NORCO/VICODIN) 5-325 MG tablet Take 2 tablets by mouth every 4 (four) hours as needed.  . [DISCONTINUED] ibuprofen (ADVIL,MOTRIN) 800 MG tablet Take 1 tablet (800 mg total) by mouth 3 (three) times daily. (Patient taking differently: Take 800 mg by mouth every 8 (eight) hours as needed for moderate pain. )  . [DISCONTINUED] ibuprofen (ADVIL,MOTRIN) 800 MG tablet Take 800 mg by mouth every 8 (eight) hours as needed for mild pain.  . [DISCONTINUED] oxyCODONE (OXY IR/ROXICODONE) 5 MG immediate release tablet Take 5-10mg  by mouth every 4 hours as needed for pain.  . [DISCONTINUED] PROAIR HFA 108 (90 Base) MCG/ACT inhaler Inhale 2 puffs into the lungs every 6 (six) hours as needed for wheezing or shortness of breath.    Allergies  Allergen Reactions  . Penicillins Anaphylaxis    Has patient had a PCN reaction causing immediate rash, facial/tongue/throat swelling, SOB or lightheadedness with hypotension: Yes Has patient had a PCN reaction causing severe rash involving mucus membranes or skin  necrosis: Yes Has patient had a PCN reaction that required hospitalization Yes- went to ED, was not admitted Has patient had a PCN reaction occurring within the last 10 years: No- longer then 10 years If all of the above answers are "NO", then may proceed with Cephalosporin use.   . Tape Rash    Other reaction(s): Other (See Comments) Burns skin  . Other Rash    Other  reaction(s): Other (See Comments) **EKG GEL PADS**  "Burns my skin" Pt states she is allergic to a blood pressure medication, unsure.  **EKG GEL PADS**  "Burns my skin"  . Latex Rash   Past Medical History:  Diagnosis Date  . Asthma   . Congenital ventricular septal defect   . Diabetes mellitus without complication ([place])   . Heart murmur   . Obesity   . Pulmonary hypertension    mild   . Tricuspid regurgitation   . VSD (ventricular septal defect), perimembranous    Qp:Qs 1.7:1 by catheterization    Family History  Problem Relation Age of Onset  . Cancer Other   . Hyperlipidemia Other   . Hypertension Other   . Asthma Other   . Diabetes Mother   . Diabetes Father    Past Surgical History:  Procedure Laterality Date  . CARDIAC CATHETERIZATION [name] 10/30/2014   Procedure: Right Heart Cath;  Surgeon: [name], MD;  Location: [facility] INVASIVE CV LAB CUPID;  Service: Cardiovascular;  Laterality: [name];  . CHOLECYSTECTOMY    . CYST EXCISION    . LEFT AND RIGHT HEART CATHETERIZATION WITH CORONARY ANGIOGRAM [name] 09/11/2013   Procedure: LEFT AND RIGHT HEART CATHETERIZATION WITH CORONARY ANGIOGRAM;  Surgeon: [name], MD;  Location: [facility] CATH LAB;  Service: Cardiovascular;  Laterality: [name];  . TUBAL LIGATION     Social History   Social History  . Marital status: Single    Spouse name: [name]  . Number of children: [name]  . Years of education: [name]   Occupational History  . Not on file.   Social History Main Topics  . Smoking status: Never Smoker  . Smokeless tobacco: Never Used  . Alcohol use No  . Drug use: No  . Sexual activity: Yes   Other Topics Concern  . Not on file   Social History Narrative   Lives with boyfriend. On disability.      Review of Systems: General: negative for chills, fever, night sweats or weight changes.  Cardiovascular: negative for chest pain, dyspnea on exertion, edema, orthopnea, palpitations, paroxysmal nocturnal dyspnea or shortness of  breath Dermatological: negative for rash Respiratory: negative for cough or wheezing Urologic: negative for hematuria Abdominal: negative for nausea, vomiting, diarrhea, bright red blood per rectum, melena, or hematemesis Neurologic: negative for visual changes, syncope, or dizziness All other systems reviewed and are otherwise negative except as noted above.   Physical Exam:  Blood pressure 130/80, pulse 88, height 4\' 11"  (1.499 m), weight 171 lb 1.9 oz (77.6 kg), SpO2 99 %.  General appearance: alert, cooperative and no distress Neck: no carotid bruit and no JVD Lungs: clear to auscultation bilaterally Heart: regular rate and rhythm, S1, S2 normal, loud 3/6 murmur throughout the precordium Extremities: extremities normal, atraumatic, no cyanosis or edema Pulses: 2+ and symmetric Skin: Skin color, texture, turgor normal. No rashes or lesions Neurologic: Grossly normal  EKG NSR. No ischemia.   ASSESSMENT AND PLAN:   1. VSD: pt with worsening symptoms. She was referred to [facility] in the past  and surgical repair offered however she refused at that time. She now wants to reconsider surgery, however states that she will only agree if her cath measurements suggest increased in severity, compared previous study. Her other cardiologist/ internist, Dr. Montez Morita, has referred her back to our office for repeat RHC. I've discussed with her procedural details including risk. She agrees to proceed.   2. HTN: BP is controlled on current regimen.   3. DM: followed by PCP. Pt instructed to hold Metformin day of cath.     Lyda Jester PA-C 08/23/2016 11:47 AM

## 2016-08-30 ENCOUNTER — Telehealth: Payer: Self-pay | Admitting: Cardiovascular Disease

## 2016-08-30 NOTE — Telephone Encounter (Signed)
New message    Pt is calling about her procedure scheduled for 3/15. She is requesting call from RN

## 2016-08-30 NOTE — Telephone Encounter (Signed)
Spoke with pt and she asked if her heart cath could be moved up to this Friday as she had something come up next week with her daughter.  Called cath lab and moved pt's cath up to this Friday at 10:30AM with Dr. Tamala Julian.  Pt is aware to arrive at hospital at 8:30AM.  Pt will come by the office tomorrow for labs.  Pt appreciative for assistance.  Will route to [employer], PA-C to make her aware of change in cath.

## 2016-08-31 ENCOUNTER — Telehealth: Payer: Self-pay | Admitting: Cardiology

## 2016-08-31 ENCOUNTER — Other Ambulatory Visit: Payer: Medicaid Other | Admitting: *Deleted

## 2016-08-31 DIAGNOSIS — Q210 Ventricular septal defect: Secondary | ICD-10-CM

## 2016-08-31 NOTE — Telephone Encounter (Signed)
Called, spoke with pt.  Pt scheduled for cath on 09/02/16. Pt asked if she could drive herself home the next day at discharge. Informed pt will NOT be allowed to drive herself home at discharge. Informed pt must have a responsible person drive her home.  If she calls a cab, pt must have a responsible person to ride with her. Pt verbalized understanding.

## 2016-08-31 NOTE — Telephone Encounter (Signed)
Patient is calling, she is scheduled for a cath on Friday 09-02-16. Patient wanted to confirm that if "she drives that she would have to stay overnight and have someone pick her up the next day?" Patient would like to confirm if this is correct, please call to discuss.Thanks.

## 2016-09-01 ENCOUNTER — Ambulatory Visit: Payer: Medicaid Other | Admitting: Internal Medicine

## 2016-09-01 LAB — CBC
Hematocrit: 34.1 % (ref 34.0–46.6)
Hemoglobin: 10.7 g/dL — ABNORMAL LOW (ref 11.1–15.9)
MCH: 24 pg — ABNORMAL LOW (ref 26.6–33.0)
MCHC: 31.4 g/dL — ABNORMAL LOW (ref 31.5–35.7)
MCV: 77 fL — AB (ref 79–97)
PLATELETS: 307 10*3/uL (ref 150–379)
RBC: 4.45 x10E6/uL (ref 3.77–5.28)
RDW: 16.5 % — ABNORMAL HIGH (ref 12.3–15.4)
WBC: 5.3 10*3/uL (ref 3.4–10.8)

## 2016-09-01 LAB — BASIC METABOLIC PANEL
BUN/Creatinine Ratio: 12 (ref 9–23)
BUN: 10 mg/dL (ref 6–24)
CHLORIDE: 99 mmol/L (ref 96–106)
CO2: 24 mmol/L (ref 18–29)
CREATININE: 0.82 mg/dL (ref 0.57–1.00)
Calcium: 9.6 mg/dL (ref 8.7–10.2)
GFR calc Af Amer: 101 mL/min/{1.73_m2} (ref >59)
GFR calc non Af Amer: 87 mL/min/{1.73_m2} (ref >59)
GLUCOSE: 137 mg/dL — AB (ref 65–99)
POTASSIUM: 4.3 mmol/L (ref 3.5–5.2)
SODIUM: 138 mmol/L (ref 134–144)

## 2016-09-01 LAB — PROTIME-INR
INR: 1 (ref 0.8–1.2)
PROTHROMBIN TIME: 10.6 s (ref 9.1–12.0)

## 2016-09-02 ENCOUNTER — Ambulatory Visit (HOSPITAL_COMMUNITY)
Admission: RE | Admit: 2016-09-02 | Discharge: 2016-09-02 | Disposition: A | Discharge status: Home / Self Care | Payer: Medicaid Other | Source: Ambulatory Visit | Attending: Cardiovascular Disease | Admitting: Cardiovascular Disease

## 2016-09-02 ENCOUNTER — Encounter (HOSPITAL_COMMUNITY)
Admission: RE | Disposition: A | Discharge status: Home / Self Care | Payer: Self-pay | Source: Ambulatory Visit | Attending: Cardiovascular Disease

## 2016-09-02 DIAGNOSIS — Z8349 Family history of other endocrine, nutritional and metabolic diseases: Secondary | ICD-10-CM | POA: Diagnosis not present

## 2016-09-02 DIAGNOSIS — Z9049 Acquired absence of other specified parts of digestive tract: Secondary | ICD-10-CM | POA: Insufficient documentation

## 2016-09-02 DIAGNOSIS — I272 Pulmonary hypertension, unspecified: Secondary | ICD-10-CM | POA: Insufficient documentation

## 2016-09-02 DIAGNOSIS — E669 Obesity, unspecified: Secondary | ICD-10-CM | POA: Insufficient documentation

## 2016-09-02 DIAGNOSIS — Z880 Allergy status to penicillin: Secondary | ICD-10-CM | POA: Insufficient documentation

## 2016-09-02 DIAGNOSIS — E119 Type 2 diabetes mellitus without complications: Secondary | ICD-10-CM | POA: Diagnosis not present

## 2016-09-02 DIAGNOSIS — Z7984 Long term (current) use of oral hypoglycemic drugs: Secondary | ICD-10-CM | POA: Insufficient documentation

## 2016-09-02 DIAGNOSIS — Z833 Family history of diabetes mellitus: Secondary | ICD-10-CM | POA: Insufficient documentation

## 2016-09-02 DIAGNOSIS — Z9851 Tubal ligation status: Secondary | ICD-10-CM | POA: Insufficient documentation

## 2016-09-02 DIAGNOSIS — Z79899 Other long term (current) drug therapy: Secondary | ICD-10-CM | POA: Diagnosis not present

## 2016-09-02 DIAGNOSIS — Z825 Family history of asthma and other chronic lower respiratory diseases: Secondary | ICD-10-CM | POA: Diagnosis not present

## 2016-09-02 DIAGNOSIS — Q210 Ventricular septal defect: Secondary | ICD-10-CM | POA: Insufficient documentation

## 2016-09-02 DIAGNOSIS — J45909 Unspecified asthma, uncomplicated: Secondary | ICD-10-CM | POA: Diagnosis not present

## 2016-09-02 DIAGNOSIS — Z8249 Family history of ischemic heart disease and other diseases of the circulatory system: Secondary | ICD-10-CM | POA: Diagnosis not present

## 2016-09-02 DIAGNOSIS — Z6833 Body mass index (BMI) 33.0-33.9, adult: Secondary | ICD-10-CM | POA: Diagnosis not present

## 2016-09-02 DIAGNOSIS — Z9889 Other specified postprocedural states: Secondary | ICD-10-CM | POA: Insufficient documentation

## 2016-09-02 DIAGNOSIS — I10 Essential (primary) hypertension: Secondary | ICD-10-CM | POA: Diagnosis not present

## 2016-09-02 DIAGNOSIS — Z888 Allergy status to other drugs, medicaments and biological substances status: Secondary | ICD-10-CM | POA: Insufficient documentation

## 2016-09-02 DIAGNOSIS — Z87892 Personal history of anaphylaxis: Secondary | ICD-10-CM | POA: Insufficient documentation

## 2016-09-02 DIAGNOSIS — I071 Rheumatic tricuspid insufficiency: Secondary | ICD-10-CM | POA: Diagnosis present

## 2016-09-02 DIAGNOSIS — Z91040 Latex allergy status: Secondary | ICD-10-CM | POA: Insufficient documentation

## 2016-09-02 DIAGNOSIS — Q2542 Hypoplasia of aorta: Secondary | ICD-10-CM | POA: Diagnosis not present

## 2016-09-02 HISTORY — PX: RIGHT HEART CATH: CATH118263

## 2016-09-02 LAB — POCT I-STAT 3, VENOUS BLOOD GAS (G3P V)
ACID-BASE DEFICIT: 3 mmol/L — AB (ref 0.0–2.0)
ACID-BASE DEFICIT: 3 mmol/L — AB (ref 0.0–2.0)
Acid-base deficit: 1 mmol/L (ref 0.0–2.0)
Acid-base deficit: 2 mmol/L (ref 0.0–2.0)
Acid-base deficit: 3 mmol/L — ABNORMAL HIGH (ref 0.0–2.0)
BICARBONATE: 23.5 mmol/L (ref 20.0–28.0)
BICARBONATE: 24.7 mmol/L (ref 20.0–28.0)
Bicarbonate: 22.3 mmol/L (ref 20.0–28.0)
Bicarbonate: 22.3 mmol/L (ref 20.0–28.0)
Bicarbonate: 22.5 mmol/L (ref 20.0–28.0)
O2 SAT: 78 %
O2 Saturation: 61 %
O2 Saturation: 75 %
O2 Saturation: 80 %
O2 Saturation: 80 %
PCO2 VEN: 38.4 mmHg — AB (ref 44.0–60.0)
PCO2 VEN: 38.6 mmHg — AB (ref 44.0–60.0)
PCO2 VEN: 39.7 mmHg — AB (ref 44.0–60.0)
PCO2 VEN: 42.3 mmHg — AB (ref 44.0–60.0)
PH VEN: 7.356 (ref 7.250–7.430)
PH VEN: 7.37 (ref 7.250–7.430)
PH VEN: 7.374 (ref 7.250–7.430)
PO2 VEN: 41 mmHg (ref 32.0–45.0)
PO2 VEN: 44 mmHg (ref 32.0–45.0)
PO2 VEN: 45 mmHg (ref 32.0–45.0)
PO2 VEN: 46 mmHg — AB (ref 32.0–45.0)
TCO2: 23 mmol/L (ref 0–100)
TCO2: 23 mmol/L (ref 0–100)
TCO2: 24 mmol/L (ref 0–100)
TCO2: 25 mmol/L (ref 0–100)
TCO2: 26 mmol/L (ref 0–100)
pCO2, Ven: 40.2 mmHg — ABNORMAL LOW (ref 44.0–60.0)
pH, Ven: 7.373 (ref 7.250–7.430)
pH, Ven: 7.379 (ref 7.250–7.430)
pO2, Ven: 33 mmHg (ref 32.0–45.0)

## 2016-09-02 LAB — GLUCOSE, CAPILLARY: GLUCOSE-CAPILLARY: 105 mg/dL — AB (ref 65–99)

## 2016-09-02 SURGERY — RIGHT HEART CATH

## 2016-09-02 MED ORDER — ASPIRIN-ACETAMINOPHEN-CAFFEINE 250-250-65 MG PO TABS: 1.0000 | ORAL_TABLET | Freq: Four times a day (QID) | ORAL | Status: DC | PRN

## 2016-09-02 MED ORDER — LIDOCAINE HCL (PF) 1 % IJ SOLN
INTRAMUSCULAR | Status: AC
  Filled 2016-09-02: qty 30

## 2016-09-02 MED ORDER — ONDANSETRON HCL 4 MG/2ML IJ SOLN: 4.0000 mg | Freq: Four times a day (QID) | INTRAMUSCULAR | Status: DC | PRN

## 2016-09-02 MED ORDER — MIDAZOLAM HCL 2 MG/2ML IJ SOLN
INTRAMUSCULAR | Status: DC | PRN
  Administered 2016-09-02 (×4): 1 mg via INTRAVENOUS

## 2016-09-02 MED ORDER — ALBUTEROL SULFATE HFA 108 (90 BASE) MCG/ACT IN AERS: 1.0000 | INHALATION_SPRAY | Freq: Four times a day (QID) | RESPIRATORY_TRACT | Status: DC | PRN

## 2016-09-02 MED ORDER — SODIUM CHLORIDE 0.9 % IV SOLN: 250.0000 mL | INTRAVENOUS | Status: DC | PRN

## 2016-09-02 MED ORDER — SODIUM CHLORIDE 0.9% FLUSH: 3.0000 mL | Freq: Two times a day (BID) | INTRAVENOUS | Status: DC

## 2016-09-02 MED ORDER — SODIUM CHLORIDE 0.9% FLUSH: 3.0000 mL | INTRAVENOUS | Status: DC | PRN

## 2016-09-02 MED ORDER — ACETAMINOPHEN 325 MG PO TABS: 650.0000 mg | ORAL_TABLET | ORAL | Status: DC | PRN

## 2016-09-02 MED ORDER — IBUPROFEN 800 MG PO TABS: 800.0000 mg | ORAL_TABLET | Freq: Three times a day (TID) | ORAL | Status: DC | PRN

## 2016-09-02 MED ORDER — MIDAZOLAM HCL 2 MG/2ML IJ SOLN
INTRAMUSCULAR | Status: AC
  Filled 2016-09-02: qty 2

## 2016-09-02 MED ORDER — TRAMADOL HCL 50 MG PO TABS: 50.0000 mg | ORAL_TABLET | Freq: Four times a day (QID) | ORAL | Status: DC | PRN

## 2016-09-02 MED ORDER — DIPHENHYDRAMINE-APAP (SLEEP) 38-500 MG PO TABS: 1.0000 | ORAL_TABLET | Freq: Every evening | ORAL | Status: DC | PRN

## 2016-09-02 MED ORDER — SODIUM CHLORIDE 0.9 % WEIGHT BASED INFUSION
3.0000 mL/kg/h | INTRAVENOUS | Status: AC
  Administered 2016-09-02: 3 mL/kg/h via INTRAVENOUS

## 2016-09-02 MED ORDER — PROMETHAZINE-CODEINE 6.25-10 MG/5ML PO SYRP: 5.0000 mL | ORAL_SOLUTION | Freq: Four times a day (QID) | ORAL | Status: DC | PRN

## 2016-09-02 MED ORDER — FENTANYL CITRATE (PF) 100 MCG/2ML IJ SOLN
INTRAMUSCULAR | Status: DC | PRN
  Administered 2016-09-02: 50 ug via INTRAVENOUS
  Administered 2016-09-02: 25 ug via INTRAVENOUS
  Administered 2016-09-02: 50 ug via INTRAVENOUS

## 2016-09-02 MED ORDER — FENTANYL CITRATE (PF) 100 MCG/2ML IJ SOLN
INTRAMUSCULAR | Status: AC
  Filled 2016-09-02: qty 2

## 2016-09-02 MED ORDER — ALOGLIPTIN-METFORMIN HCL 12.5-1000 MG PO TABS: 1.0000 | ORAL_TABLET | Freq: Every day | ORAL | Status: DC

## 2016-09-02 MED ORDER — OXYCODONE HCL 5 MG PO TABS: 5.0000 mg | ORAL_TABLET | ORAL | Status: DC | PRN

## 2016-09-02 MED ORDER — LIDOCAINE HCL (PF) 1 % IJ SOLN
INTRAMUSCULAR | Status: DC | PRN
  Administered 2016-09-02: 45 mL via INTRADERMAL

## 2016-09-02 MED ORDER — SODIUM CHLORIDE 0.9 % IV SOLN: INTRAVENOUS | Status: DC

## 2016-09-02 MED ORDER — HEPARIN (PORCINE) IN NACL 2-0.9 UNIT/ML-% IJ SOLN
INTRAMUSCULAR | Status: DC | PRN
  Administered 2016-09-02: 1000 mL via INTRA_ARTERIAL

## 2016-09-02 MED ORDER — ALBUTEROL SULFATE (2.5 MG/3ML) 0.083% IN NEBU: 2.5000 mg | INHALATION_SOLUTION | Freq: Four times a day (QID) | RESPIRATORY_TRACT | Status: DC | PRN

## 2016-09-02 MED ORDER — HEPARIN (PORCINE) IN NACL 2-0.9 UNIT/ML-% IJ SOLN
INTRAMUSCULAR | Status: AC
  Filled 2016-09-02: qty 1000

## 2016-09-02 MED ORDER — ASPIRIN 81 MG PO CHEW
CHEWABLE_TABLET | ORAL | Status: AC
  Filled 2016-09-02: qty 1

## 2016-09-02 MED ORDER — AMLODIPINE BESYLATE 5 MG PO TABS: 5.0000 mg | ORAL_TABLET | Freq: Every day | ORAL | Status: DC

## 2016-09-02 MED ORDER — ASPIRIN 81 MG PO CHEW
81.0000 mg | CHEWABLE_TABLET | ORAL | Status: AC
  Administered 2016-09-02: 81 mg via ORAL

## 2016-09-02 MED ORDER — SODIUM CHLORIDE 0.9 % WEIGHT BASED INFUSION: 1.0000 mL/kg/h | INTRAVENOUS | Status: DC

## 2016-09-02 SURGICAL SUPPLY — 5 items
CATH SWAN GANZ 7F STRAIGHT (CATHETERS) ×2 IMPLANT
COVER PRB 48X5XTLSCP FOLD TPE (BAG) ×2 IMPLANT
COVER PROBE 5X48 (BAG) ×2
PACK CARDIAC CATHETERIZATION (CUSTOM PROCEDURE TRAY) ×2 IMPLANT
SHEATH PINNACLE 7F 10CM (SHEATH) ×2 IMPLANT

## 2016-09-02 NOTE — H&P (View-Only) (Signed)
08/23/2016 Mackenzie Patterson   [birth year]  [id number]  Primary Physician [name], MD Primary Cardiologist: Dr. Meda Coffee [facility]    Reason for Visit/CC: F/u for VSD; PCP referral for [place] Referring MD: Dr. Ardyth Gal. Spruill, (Cardiology/Internal Medicine)  HPI:  A 46 year old AA female with prior medical history of hypertension, diabetes mellitus, obesity and history of congenitial membranous ventricular septal defect. She has been followed by Dr. Meda Coffee. Also followed by Dr. Montez Morita (cardiologist/ PCP). She had been referred, in the past, to [facility] where she was offered an option of surgical closure of her VSD or medical management and she decided not to undergo surgery. She continues to be followed at [facility]. She has opted to have periodic RHCs to monitor her VSD. She reports that she is now reconsidering surgery. She will agree to repair if her cath measurements have increased in severity, compared previous study. She had a R/LHC in 2015 that showed normal coronaries. Her last cath in 2016 was a RHC only. Results are as follows: RHC 10/30/14 Conclusion    Moderate pulmonary HTN  PVR of 4.14 Woods units  VSD with left to right shunt.  Qp 5.79 L/min  Qs 4.01 L/min  shunt ratio 1.44   VSD with shunt ratio 1.44/1 Moderate pulmonary HTN with PVR of 4 Woods units.  Compared to prior cardiac cath in March 2015 there has been significant increase in pulmonary pressures with increase in PVR from 2 to 4.1 Woods units.    She reports that her symptoms have worsened. She notes more frequent palpitations and exertional dyspnea. She notes CP that is atypical. Sharp in nature. Her PCP, Dr. Montez Morita, recently ordered a 48 hr Holter that showed no significant arrhthymias. She saw Dr. Montez Morita on 08/16/16 who recommended that she f/u in our office to get set up for repeat [place].    Current Meds  Medication Sig  . Alogliptin-Metformin HCl (KAZANO) 12.10-998 MG TABS Take 1 tablet by mouth daily.    Marland Kitchen amLODipine (NORVASC) 5 MG tablet Take 5 mg by mouth daily.   . [DISCONTINUED] acyclovir (ZOVIRAX) 400 MG tablet Take 1 tablet (400 mg total) by mouth 3 (three) times daily.  . [DISCONTINUED] albuterol (PROVENTIL) (2.5 MG/3ML) 0.083% nebulizer solution Inhale 2.5mg  four times a day as needed for shortness of breath  . [DISCONTINUED] atorvastatin (LIPITOR) 10 MG tablet TK 1 T PO QAM  . [DISCONTINUED] Fluticasone Furoate (ARNUITY ELLIPTA) 200 MCG/ACT AEPB Inhale 1 puff into the lungs daily.  . [DISCONTINUED] HYDROcodone-acetaminophen (NORCO/VICODIN) 5-325 MG tablet Take 2 tablets by mouth every 4 (four) hours as needed.  . [DISCONTINUED] ibuprofen (ADVIL,MOTRIN) 800 MG tablet Take 1 tablet (800 mg total) by mouth 3 (three) times daily. (Patient taking differently: Take 800 mg by mouth every 8 (eight) hours as needed for moderate pain. )  . [DISCONTINUED] ibuprofen (ADVIL,MOTRIN) 800 MG tablet Take 800 mg by mouth every 8 (eight) hours as needed for mild pain.  . [DISCONTINUED] oxyCODONE (OXY IR/ROXICODONE) 5 MG immediate release tablet Take 5-10mg  by mouth every 4 hours as needed for pain.  . [DISCONTINUED] PROAIR HFA 108 (90 Base) MCG/ACT inhaler Inhale 2 puffs into the lungs every 6 (six) hours as needed for wheezing or shortness of breath.    Allergies  Allergen Reactions  . Penicillins Anaphylaxis    Has patient had a PCN reaction causing immediate rash, facial/tongue/throat swelling, SOB or lightheadedness with hypotension: Yes Has patient had a PCN reaction causing severe rash involving mucus membranes or skin  necrosis: Yes Has patient had a PCN reaction that required hospitalization Yes- went to ED, was not admitted Has patient had a PCN reaction occurring within the last 10 years: No- longer then 10 years If all of the above answers are "NO", then may proceed with Cephalosporin use.   . Tape Rash    Other reaction(s): Other (See Comments) Burns skin  . Other Rash    Other  reaction(s): Other (See Comments) **EKG GEL PADS**  "Burns my skin" Pt states she is allergic to a blood pressure medication, unsure.  **EKG GEL PADS**  "Burns my skin"  . Latex Rash   Past Medical History:  Diagnosis Date  . Asthma   . Congenital ventricular septal defect   . Diabetes mellitus without complication ([place])   . Heart murmur   . Obesity   . Pulmonary hypertension    mild   . Tricuspid regurgitation   . VSD (ventricular septal defect), perimembranous    Qp:Qs 1.7:1 by catheterization    Family History  Problem Relation Age of Onset  . Cancer Other   . Hyperlipidemia Other   . Hypertension Other   . Asthma Other   . Diabetes Mother   . Diabetes Father    Past Surgical History:  Procedure Laterality Date  . CARDIAC CATHETERIZATION [name] 10/30/2014   Procedure: Right Heart Cath;  Surgeon: [name], MD;  Location: [facility] INVASIVE CV LAB CUPID;  Service: Cardiovascular;  Laterality: [name];  . CHOLECYSTECTOMY    . CYST EXCISION    . LEFT AND RIGHT HEART CATHETERIZATION WITH CORONARY ANGIOGRAM [name] 09/11/2013   Procedure: LEFT AND RIGHT HEART CATHETERIZATION WITH CORONARY ANGIOGRAM;  Surgeon: [name], MD;  Location: [facility] CATH LAB;  Service: Cardiovascular;  Laterality: [name];  . TUBAL LIGATION     Social History   Social History  . Marital status: Single    Spouse name: [name]  . Number of children: [name]  . Years of education: [name]   Occupational History  . Not on file.   Social History Main Topics  . Smoking status: Never Smoker  . Smokeless tobacco: Never Used  . Alcohol use No  . Drug use: No  . Sexual activity: Yes   Other Topics Concern  . Not on file   Social History Narrative   Lives with boyfriend. On disability.      Review of Systems: General: negative for chills, fever, night sweats or weight changes.  Cardiovascular: negative for chest pain, dyspnea on exertion, edema, orthopnea, palpitations, paroxysmal nocturnal dyspnea or shortness of  breath Dermatological: negative for rash Respiratory: negative for cough or wheezing Urologic: negative for hematuria Abdominal: negative for nausea, vomiting, diarrhea, bright red blood per rectum, melena, or hematemesis Neurologic: negative for visual changes, syncope, or dizziness All other systems reviewed and are otherwise negative except as noted above.   Physical Exam:  Blood pressure 130/80, pulse 88, height 4\' 11"  (1.499 m), weight 171 lb 1.9 oz (77.6 kg), SpO2 99 %.  General appearance: alert, cooperative and no distress Neck: no carotid bruit and no JVD Lungs: clear to auscultation bilaterally Heart: regular rate and rhythm, S1, S2 normal, loud 3/6 murmur throughout the precordium Extremities: extremities normal, atraumatic, no cyanosis or edema Pulses: 2+ and symmetric Skin: Skin color, texture, turgor normal. No rashes or lesions Neurologic: Grossly normal  EKG NSR. No ischemia.   ASSESSMENT AND PLAN:   1. VSD: pt with worsening symptoms. She was referred to [facility] in the past  and surgical repair offered however she refused at that time. She now wants to reconsider surgery, however states that she will only agree if her cath measurements suggest increased in severity, compared previous study. Her other cardiologist/ internist, Dr. Montez Morita, has referred her back to our office for repeat RHC. I've discussed with her procedural details including risk. She agrees to proceed.   2. HTN: BP is controlled on current regimen.   3. DM: followed by PCP. Pt instructed to hold Metformin day of cath.     Lyda Jester PA-C 08/23/2016 11:47 AM

## 2016-09-02 NOTE — Progress Notes (Addendum)
Site area: Rt fem venous  Site Prior to Removal:  Level 0 Pressure Applied For: 30min Manual:   Yes Patient Status During Pull:  A/O Post Pull Site:  Level 0 Post Pull Instructions Given:  Instructions given and pt understands Post Pull Pulses Present: 2+ rt dp/pt Dressing Applied:  Tegaderm and a 4x4 Bedrest begins @ 12:55:00 Comments: Pt leaves Cath Lab holding area @ 13:55:00. Rt groin dressing is CDI, Rt groin site is unremarkable. Family at bedside and will go with pt to Short Stay.

## 2016-09-02 NOTE — Progress Notes (Signed)
Spoke with patients family, confirmed she will have family staying with her tonight at home and they will take her home.   Contact is her mother Karianna Gusman Phone# [phone]

## 2016-09-02 NOTE — Interval H&P Note (Deleted)
History and Physical Interval Note:  09/02/2016 11:01 AM This is a peak daily her situation with this 46 year old female, known membranous ventriculoseptal defect. She is set up to have right heart cath only. She is diabetic. She is also followed at [facility]. She now feels that she is having increasing exertional fatigue and dyspnea. She is referred by her cardiologist Dr. Wallene Huh for right heart catheterization. She states that she will have VSD repair if her "numbers are going up". The last situation such as this was 2016 when right heart cath was done here in several weeks later she underwent right and left heart cath at [facility] to verify the numbness that were obtained here at [facility]. I recommended that she delay heart cath here and speak to the heart team at [facility] in April when she is scheduled to have yearly follow-up. This would make sense since cath data is usually repeated at [facility] and this approach would lead to having a heart cath done only once. She refused that option and wants to know what her numbers are now (I believe more out of convenience than any other reason). It appears that if her numbers are equivalent to prior findings, she may skip her [facility] appointment. If they are higher, she plans to proceed with surgery. She refused to allow coronary angiography.  Mackenzie Patterson  has presented today for surgery, with the diagnosis of vsd  The various methods of treatment have been discussed with the patient and family. After consideration of risks, benefits and other options for treatment, the patient has consented to  Procedure(s): Right Heart Cath ([name]) as a surgical intervention .  The patient's history has been reviewed, patient examined, no change in status, stable for surgery.  I have reviewed the patient's chart and labs.  Questions were answered to the patient's satisfaction.     [name]

## 2016-09-02 NOTE — Discharge Instructions (Signed)
NO ALOGLIPTIN-METFORMIN [facility]) for 2 days  Angiogram, Care After This sheet gives you information about how to care for yourself after your procedure. Your health care provider may also give you more specific instructions. If you have problems or questions, contact your health care provider. What can I expect after the procedure? After the procedure, it is common to have bruising and tenderness at the catheter insertion area. Follow these instructions at home: Insertion site care   Follow instructions from your health care provider about how to take care of your insertion site. Make sure you:  Wash your hands with soap and water before you change your bandage (dressing). If soap and water are not available, use hand sanitizer.  Change your dressing as told by your health care provider.  Leave stitches (sutures), skin glue, or adhesive strips in place. These skin closures may need to stay in place for 2 weeks or longer. If adhesive strip edges start to loosen and curl up, you may trim the loose edges. Do not remove adhesive strips completely unless your health care provider tells you to do that.  Do not take baths, swim, or use a hot tub until your health care provider approves.  You may shower 24-48 hours after the procedure or as told by your health care provider.  Gently wash the site with plain soap and water.  Pat the area dry with a clean towel.  Do not rub the site. This may cause bleeding.  Do not apply powder or lotion to the site. Keep the site clean and dry.  Check your insertion site every day for signs of infection. Check for:  Redness, swelling, or pain.  Fluid or blood.  Warmth.  Pus or a bad smell. Activity   Rest as told by your health care provider, usually for 1-2 days.  Do not lift anything that is heavier than 10 lbs. (4.5 kg) or as told by your health care provider.  Do not drive for 24 hours if you were given a medicine to help you relax  (sedative).  Do not drive or use heavy machinery while taking prescription pain medicine. General instructions   Return to your normal activities as told by your health care provider, usually in about a week. Ask your health care provider what activities are safe for you.  If the catheter site starts bleeding, lie flat and put pressure on the site. If the bleeding does not stop, get help right away. This is a medical emergency.  Drink enough fluid to keep your urine clear or pale yellow. This helps flush the contrast dye from your body.  Take over-the-counter and prescription medicines only as told by your health care provider.  Keep all follow-up visits as told by your health care provider. This is important. Contact a health care provider if:  You have a fever or chills.  You have redness, swelling, or pain around your insertion site.  You have fluid or blood coming from your insertion site.  The insertion site feels warm to the touch.  You have pus or a bad smell coming from your insertion site.  You have bruising around the insertion site.  You notice blood collecting in the tissue around the catheter site (hematoma). The hematoma may be painful to the touch. Get help right away if:  You have severe pain at the catheter insertion area.  The catheter insertion area swells very fast.  The catheter insertion area is bleeding, and the bleeding does not  stop when you hold steady pressure on the area.  The area near or just beyond the catheter insertion site becomes pale, cool, tingly, or numb. These symptoms may represent a serious problem that is an emergency. Do not wait to see if the symptoms will go away. Get medical help right away. Call your local emergency services (911 in the [place] Do not drive yourself to the hospital. Summary  After the procedure, it is common to have bruising and tenderness at the catheter insertion area.  After the procedure, it is important to  rest and drink plenty of fluids.  Do not take baths, swim, or use a hot tub until your health care provider says it is okay to do so. You may shower 24-48 hours after the procedure or as told by your health care provider.  If the catheter site starts bleeding, lie flat and put pressure on the site. If the bleeding does not stop, get help right away. This is a medical emergency. This information is not intended to replace advice given to you by your health care provider. Make sure you discuss any questions you have with your health care provider. Document Released: 12/30/2004 Document Revised: 05/18/2016 Document Reviewed: 05/18/2016 Elsevier Interactive Patient Education  2017 [employer].

## 2016-09-02 NOTE — Interval H&P Note (Signed)
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 She comes in for right heart cath only to measure pulmonary artery pressures. She is followed by Dr. Montez Morita and also at [facility]. In 2016 she had 2 heart catheterizations performed within a month of each other. The first was done here at [facility] by Dr. Martinique. She was seen several weeks later at [facility] and both left and right heart catheterizations were repeated. She has an upcoming appointment at [facility] in April. I recommended that she cancel this appointment and allow the doctors at [facility] to do their full evaluation. My concern was that we would put her through the risk of this procedure and associated discomfort but have the information not mean very much in her overall management. Despite my thoughts, she wants to proceed with right heart cath only. She refused to consent for coronary angiography. Mackenzie Patterson  has presented today for surgery, with the diagnosis of vsd  The various methods of treatment have been discussed with the patient and family. After consideration of risks, benefits and other options for treatment, the patient has consented to  Procedure(s): Right Heart Cath ([name]) as a surgical intervention .  The patient's history has been reviewed, patient examined, no change in status, stable for surgery.  I have reviewed the patient's chart and labs.  Questions were answered to the patient's satisfaction.     [name]

## 2016-09-05 ENCOUNTER — Encounter (HOSPITAL_COMMUNITY): Payer: Self-pay | Admitting: Interventional Cardiology

## 2016-10-03 ENCOUNTER — Ambulatory Visit (INDEPENDENT_AMBULATORY_CARE_PROVIDER_SITE_OTHER): Payer: Medicaid Other | Admitting: Physician Assistant

## 2016-10-03 ENCOUNTER — Encounter (INDEPENDENT_AMBULATORY_CARE_PROVIDER_SITE_OTHER): Payer: Self-pay | Admitting: Physician Assistant

## 2016-10-03 VITALS — BP 131/82 | HR 85 | Temp 97.9°F | Ht 60.0 in | Wt 172.0 lb

## 2016-10-03 DIAGNOSIS — R002 Palpitations: Secondary | ICD-10-CM

## 2016-10-03 DIAGNOSIS — R739 Hyperglycemia, unspecified: Secondary | ICD-10-CM

## 2016-10-03 DIAGNOSIS — E119 Type 2 diabetes mellitus without complications: Secondary | ICD-10-CM | POA: Diagnosis not present

## 2016-10-03 DIAGNOSIS — K120 Recurrent oral aphthae: Secondary | ICD-10-CM | POA: Diagnosis not present

## 2016-10-03 DIAGNOSIS — B001 Herpesviral vesicular dermatitis: Secondary | ICD-10-CM | POA: Diagnosis not present

## 2016-10-03 DIAGNOSIS — R0689 Other abnormalities of breathing: Secondary | ICD-10-CM

## 2016-10-03 LAB — POCT GLYCOSYLATED HEMOGLOBIN (HGB A1C): Hemoglobin A1C: 6.5

## 2016-10-03 MED ORDER — VALACYCLOVIR HCL 500 MG PO TABS: 500.0000 mg | ORAL_TABLET | Freq: Two times a day (BID) | ORAL | 0 refills | Status: DC

## 2016-10-03 MED ORDER — AZITHROMYCIN 250 MG PO TABS: ORAL_TABLET | ORAL | 0 refills | Status: DC

## 2016-10-03 NOTE — Progress Notes (Signed)
New patient  Pt gets re-occurring fever blisters Pt complains that the inside of top lip is dry  Pt denies being in pain today

## 2016-10-03 NOTE — Patient Instructions (Addendum)
Cold Sore A cold sore, also called a fever blister, is a skin infection that causes small, fluid-filled sores to form inside of the mouth or on the lips, gums, nose, chin, or cheeks. Cold sores can spread to other parts of the body, such as the eyes or fingers. In some people with other medical conditions, cold sores can spread to multiple other body sites, including the genitals. Cold sores can be spread or passed from person to person (contagious) until the sores crust over completely. What are the causes? Cold sores are caused by the herpes simplex virus (HSV-1). HSV-1 is closely related to the virus that causes genital herpes (HSV-2), but these viruses are not the same. Once a person is infected with HSV-1, the virus remains permanently in the body. HSV-1 is spread from person to person through close contact, such as through kissing, touching the affected area, or sharing personal items such as lip balm, razors, or eating utensils. What increases the risk? A cold sore outbreak is more likely to develop in people who:  Are tired, stressed, or sick.  Are menstruating.  Are pregnant.  Take certain medicines.  Are exposed to cold weather or too much sun. What are the signs or symptoms? Symptoms of a cold sore outbreak often go through different stages. Here is how a cold sore develops:  Tingling, itching, or burning is felt 1-2 days before the outbreak.  Fluid-filled blisters appear on the lips, inside the mouth, on the nose, or on the cheeks.  The blisters start to ooze clear fluid.  The blisters dry up and a yellow crust appears in its place.  The crust falls off. Other symptoms include:  Fever.  Sore throat.  Headache.  Muscle aches.  Swollen neck glands. You also may not have any symptoms. How is this diagnosed? This condition is often diagnosed based on your medical history and a physical exam. Your health care provider may swab your sore and then examine it in the lab.  Rarely, blood tests may be done to check for HSV-1. How is this treated? There is no cure for cold sores or HSV-1. There also is no vaccine for HSV-1. Most cold sores go away on their own without treatment within two weeks. Medicines cannot make the infection go away, but medicines can:  Help relieve some of the pain associated with the sores.  Work to stop the virus from multiplying.  Shorten healing time. Medicines may be in the form of creams, gels, pills, or a shot. Follow these instructions at home: Medicines   Take or apply over-the-counter and prescription medicines only as told by your health care provider.  Use a cotton-tip swab to apply creams or gels to your sores. Sore Care   Do not touch the sores or pick the scabs.  Wash your hands often. Do not touch your eyes without washing your hands first.  Keep the sores clean and dry.  If directed, apply ice to the sores:  Put ice in a plastic bag.  Place a towel between your skin and the bag.  Leave the ice on for 20 minutes, 2-3 times per day. Lifestyle   Do not kiss, have oral sex, or share personal items until your sores heal.  Eat a soft, bland diet. Avoid eating hot, cold, or salty foods. These can hurt your mouth.  Use a straw if it hurts to drink out of a glass.  Avoid the sun and limit your stress if these things trigger  outbreaks. If sun causes cold sores, apply sunscreen on your lips before being out in the sun. Contact a health care provider if:  You have symptoms for more than two weeks.  You have pus coming from the sores.  You have redness that is spreading.  You have pain or irritation in your eye.  You get sores on your genitals.  Your sores do not heal within two weeks.  You have frequent cold sore outbreaks. Get help right away if:  You have a fever and your symptoms suddenly get worse.  You have a headache and confusion. This information is not intended to replace advice given to you  by your health care provider. Make sure you discuss any questions you have with your health care provider. Document Released: 06/10/2000 Document Revised: 02/05/2016 Document Reviewed: 04/03/2015 Elsevier Interactive Patient Education  2017 [employer]. Diabetes Mellitus and Food It is important for you to manage your blood sugar (glucose) level. Your blood glucose level can be greatly affected by what you eat. Eating healthier foods in the appropriate amounts throughout the day at about the same time each day will help you control your blood glucose level. It can also help slow or prevent worsening of your diabetes mellitus. Healthy eating may even help you improve the level of your blood pressure and reach or maintain a healthy weight. General recommendations for healthful eating and cooking habits include:  Eating meals and snacks regularly. Avoid going long periods of time without eating to lose weight.  Eating a diet that consists mainly of plant-based foods, such as fruits, vegetables, nuts, legumes, and whole grains.  Using low-heat cooking methods, such as baking, instead of high-heat cooking methods, such as deep frying. Work with your dietitian to make sure you understand how to use the Nutrition Facts information on food labels. How can food affect me? Carbohydrates  Carbohydrates affect your blood glucose level more than any other type of food. Your dietitian will help you determine how many carbohydrates to eat at each meal and teach you how to count carbohydrates. Counting carbohydrates is important to keep your blood glucose at a healthy level, especially if you are using insulin or taking certain medicines for diabetes mellitus. Alcohol  Alcohol can cause sudden decreases in blood glucose (hypoglycemia), especially if you use insulin or take certain medicines for diabetes mellitus. Hypoglycemia can be a life-threatening condition. Symptoms of hypoglycemia (sleepiness, dizziness,  and disorientation) are similar to symptoms of having too much alcohol. If your health care provider has given you approval to drink alcohol, do so in moderation and use the following guidelines:  Women should not have more than one drink per day, and men should not have more than two drinks per day. One drink is equal to:  12 oz of beer.  5 oz of wine.  1 oz of hard liquor.  Do not drink on an empty stomach.  Keep yourself hydrated. Have water, diet soda, or unsweetened iced tea.  Regular soda, juice, and other mixers might contain a lot of carbohydrates and should be counted. What foods are not recommended? As you make food choices, it is important to remember that all foods are not the same. Some foods have fewer nutrients per serving than other foods, even though they might have the same number of calories or carbohydrates. It is difficult to get your body what it needs when you eat foods with fewer nutrients. Examples of foods that you should avoid that are high  in calories and carbohydrates but low in nutrients include:  Trans fats (most processed foods list trans fats on the Nutrition Facts label).  Regular soda.  Juice.  Candy.  Sweets, such as cake, pie, doughnuts, and cookies.  Fried foods. What foods can I eat? Eat nutrient-rich foods, which will nourish your body and keep you healthy. The food you should eat also will depend on several factors, including:  The calories you need.  The medicines you take.  Your weight.  Your blood glucose level.  Your blood pressure level.  Your cholesterol level. You should eat a variety of foods, including:  Protein.  Lean cuts of meat.  Proteins low in saturated fats, such as fish, egg whites, and beans. Avoid processed meats.  Fruits and vegetables.  Fruits and vegetables that may help control blood glucose levels, such as apples, mangoes, and yams.  Dairy products.  Choose fat-free or low-fat dairy products,  such as milk, yogurt, and cheese.  Grains, bread, pasta, and rice.  Choose whole grain products, such as multigrain bread, whole oats, and brown rice. These foods may help control blood pressure.  Fats.  Foods containing healthful fats, such as nuts, avocado, olive oil, canola oil, and fish. Does everyone with diabetes mellitus have the same meal plan? Because every person with diabetes mellitus is different, there is not one meal plan that works for everyone. It is very important that you meet with a dietitian who will help you create a meal plan that is just right for you. This information is not intended to replace advice given to you by your health care provider. Make sure you discuss any questions you have with your health care provider. Document Released: 03/10/2005 Document Revised: 11/19/2015 Document Reviewed: 05/10/2013 Elsevier Interactive Patient Education  2017 [employer].

## 2016-10-03 NOTE — Progress Notes (Signed)
Subjective:  [name], female    DOB: [birth year]  Age: 46 y.o. MRN: [id number]  CC: fever blister  HPI Mackenzie Patterson is a 46 y.o. female with a PMH of DM2, Tricuspid regurgitation, VSD, pulmonary hypertension, and asthma presents with fever blister and "dry lip inside".  Onset of dry lip about three weeks ago. Also has a fever blister on the left upper lip. Gets a fever blister approximately every two months. Says blister is preceded by diarrhea. There is also associated nausea that is relieved with Ginger Ale. Has also noted a "dry" spot on the area of the frenulum of top lip. The dryness has also been present for 3 weeks. There is a sharp burning pain associated with the "dry" inner lip.      Pt is noted to have had a recent visit for a right heart catheterization on 09/02/2016. Was noted to have the following:   Hemodynamic findings consistent with moderate pulmonary hypertension.    Moderate pulmonary hypertension without significant change when compared to 2016 data.  Pulmonary vascular resistance 2.81 Woods units  Qp/Qs = 1.1     She has been managed by [facility] since at least 09/17/2014. Has a f/u appointment in 4 days (10/07/16). Endorses occasional palpitations. Taking Amlodipine with good control of BP. Has occasional non exertional dyspnea. Does not endorse any other symptoms.    Review of Systems  Constitutional: Negative for chills, fever and malaise/fatigue.  HENT:       Upper lip mucosal "dryness"  Eyes: Negative for blurred vision.  Respiratory: Negative for cough, hemoptysis, sputum production, shortness of breath and wheezing.   Cardiovascular: Positive for palpitations. Negative for chest pain, orthopnea, leg swelling and PND.  Gastrointestinal: Negative for abdominal pain, nausea and vomiting.  Genitourinary: Negative for dysuria and hematuria.  Musculoskeletal: Negative for joint pain and myalgias.  Skin: Negative for rash.       Fever blister    Neurological: Negative for tingling and headaches.  Psychiatric/Behavioral: Negative for depression. The patient is not nervous/anxious.     Objective:  BP 131/82 (BP Location: Left Arm, Patient Position: Sitting, Cuff Size: Normal)   Pulse 85   Temp 97.9 F (36.6 C) (Oral)   Ht 5' (1.524 m)   Wt 172 lb (78 kg)   LMP 08/26/2016 (Exact Date)   SpO2 100%   BMI 33.59 kg/m   BP/Weight 10/03/2016 09/02/2016 4/00/8676  Systolic BP 195 093 267  Diastolic BP 82 81 80  Wt. (Lbs) 172 167 171.12  BMI 33.59 33.73 34.56      Physical Exam  Constitutional: She is oriented to person, place, and time.  Well developed, overweight, NAD, polite  HENT:  Head: Normocephalic and atraumatic.  Healing aphthous ulcer in the frenulum of the upper lip. Resolving herpes labialis of the left aspect of upper lip.  Eyes: Conjunctivae are normal. No scleral icterus.  Neck: Normal range of motion.  Cardiovascular:  Holosystolic 4/6 murmur. S1 and S2. No gallops or rubs. Normal rate.  Pulmonary/Chest: Effort normal.  Mildly decreased breath sounds of the right lung fields in comparison to the left lung fields.  Musculoskeletal: She exhibits no edema.  Lymphadenopathy:    She has no cervical adenopathy.  Neurological: She is alert and oriented to person, place, and time.  Skin: Skin is warm and dry. No rash noted. No erythema. No pallor.  Psychiatric: She has a normal mood and affect. Her behavior is normal. Thought content normal.  Vitals  reviewed.    Assessment & Plan:   1. Aphthae, oral - Azithromycin 250 mg  2. Fever blister - Valacyclovir 500 mg daily  3. Palpitations - None observed in clinic. Patient to follow up with [facility]  4. Abnormal breath sounds - Right sided breath sounds mildly decreased in comparison to left sided breath sounds. - DG Chest 2 View; Future  6. Type 2 diabetes mellitus without complication, without long-term current use of insulin (HCC) - A1c 6.5% in  clinic today.   Meds ordered this encounter  Medications  . valACYclovir (VALTREX) 500 MG tablet    Sig: Take 1 tablet (500 mg total) by mouth 2 (two) times daily.    Dispense:  90 tablet    Refill:  0    Order Specific Question:   Supervising Provider    Answer:   Tresa Garter [id number]  . azithromycin (ZITHROMAX) 250 MG tablet    Sig: Take on tablet daily    Dispense:  6 tablet    Refill:  0    Order Specific Question:   Supervising Provider    Answer:   Tresa Garter [[id number]]    Follow-up: Return in about 4 weeks (around 10/31/2016).   Clent Demark PA

## 2016-10-20 ENCOUNTER — Encounter: Payer: Self-pay | Admitting: *Deleted

## 2016-10-24 ENCOUNTER — Telehealth: Payer: Self-pay | Admitting: Cardiology

## 2016-10-24 NOTE — Telephone Encounter (Signed)
Ok with me 

## 2016-10-24 NOTE — Telephone Encounter (Signed)
New Message     Pt would like to switch from Dr Meda Coffee to Dr Marlou Porch, to be followed for Cardiology

## 2016-10-25 ENCOUNTER — Ambulatory Visit: Payer: Medicaid Other | Admitting: Physician Assistant

## 2016-10-25 NOTE — Telephone Encounter (Signed)
Please refer her to Dr Jeralyn Bennett from [facility], he come to the [facility] st 1-2x/months. She is non-compliant, referred to CT surgery 2 years ago, but decided against surgery as her father advised so.

## 2016-10-25 NOTE — Telephone Encounter (Signed)
She really needs an expert in adult congenital disease, This is beyond what I can offer her at this point.

## 2016-10-25 NOTE — Telephone Encounter (Signed)
Pt states she does follow with [facility] for VSD.  She recently saw them in early April.  [facility] advised her to establish with a General Cardiologist in this area, for she lives in [place], for continuity of care, and having the accessibility for urgent cardiac issues, if needed.

## 2016-10-25 NOTE — Telephone Encounter (Signed)
Spoke with the pt.  She has decided to keep Dr Meda Coffee as her general Cardiologist.  Pt states "I can't remember all my MD's." Pt states her Dad advised her to switch to Dr Marlou Porch. Pt states she didn't realize Dr Meda Coffee worked with Dr Marlou Porch.  Pt states she made an appt with Ellen Henri PA-C for 5/14 for follow-up.  Pt states she will have her advise when she should see Dr Meda Coffee again, at that visit.  Pt reports apologies for all the confusion.  Will route this message to Dr Meda Coffee to make her aware that the pt wants to stay under her care.

## 2016-10-25 NOTE — Telephone Encounter (Signed)
Reviewed chart. I do not wish to accept her as new patient. Needs to be followed by someone with congenital experience.  Sorry for the inconvenience.   Candee Furbish, MD

## 2016-11-02 ENCOUNTER — Ambulatory Visit (INDEPENDENT_AMBULATORY_CARE_PROVIDER_SITE_OTHER): Payer: Medicaid Other | Admitting: Physician Assistant

## 2016-11-07 ENCOUNTER — Ambulatory Visit (INDEPENDENT_AMBULATORY_CARE_PROVIDER_SITE_OTHER): Payer: Medicaid Other | Admitting: Cardiology

## 2016-11-07 ENCOUNTER — Encounter: Payer: Self-pay | Admitting: Cardiology

## 2016-11-07 VITALS — BP 121/78 | HR 89 | Resp 16 | Ht 59.0 in | Wt 167.0 lb

## 2016-11-07 DIAGNOSIS — Q210 Ventricular septal defect: Secondary | ICD-10-CM | POA: Diagnosis not present

## 2016-11-07 NOTE — Progress Notes (Signed)
11/07/2016 Mackenzie Patterson   02/07/1971  [id number]  Primary Physician Mackenzie Demark, PA-C Primary Cardiologist: Dr. Meda Patterson [facility]/ Dr. Ardyth Gal. Patterson, (Cardiology/Internal Medicine)   Reason for Visit/CC: f/u for ASD  HPI:  Mackenzie Patterson is a 46 y.o. female who is being seen today given history of ASD. PMH includes hypertension, diabetes mellitus, obesity and history of congenitial membranous ventricular septal defect. She has been followed by Dr. Meda Patterson. Also followed by Dr. Montez Patterson (cardiologist/ PCP). She had been referred, in the past, to [facility] where she was offered an option of surgical closure of her VSD or medical management and she decided not to undergo surgery. She continues to be followed at [facility]. She has opted to have periodic RHCs to monitor her VSD. Her most recent [place] was 09/02/16 by Dr. Tamala Patterson. She was found to have moderate pulmonary hypertension without significant change when compared to 2016 data. Pulmonary vascular resistance 2.81 Woods units Qp/Qs = 1.1. It has recently been advised by Dr. Meda Patterson that she primarily needs to be followed by provider that specializes in congenital cardiac defects. She has recommended Dr Mackenzie Patterson from [facility], who comes to [facility] 1-2 x/month. She has no other cardiac issues that need to be followed. See Dr. Francesca Patterson telephone encounter from 10/25/16.  She denies CP. No dyspnea, syncope/ near syncope. She reports waking up from her sleep often in a panic as if she is chocking. She has been told by others that she snores in her sleep. She reports chronic daytime somnolence and fatigue. Epworth score was calculated today and high at 13.   She also reports that her PCP, Dr. Montez Patterson, recently retired. She has no refills on her diabetes medications. Her Alogliptin-Metformin HCl Is no longer covered by her insurance. She needs assistance with medications.     Current Meds  Medication Sig  . albuterol (PROVENTIL HFA;VENTOLIN HFA) 108  (90 Base) MCG/ACT inhaler Inhale 1-2 puffs into the lungs every 6 (six) hours as needed for wheezing or shortness of breath.  Marland Kitchen albuterol (PROVENTIL) (2.5 MG/3ML) 0.083% nebulizer solution Take 2.5 mg by nebulization every 6 (six) hours as needed for wheezing or shortness of breath.  . Alogliptin-Metformin HCl (KAZANO) 12.10-998 MG TABS Take 1 tablet by mouth daily.   Marland Kitchen amLODipine (NORVASC) 5 MG tablet Take 5 mg by mouth daily.   Marland Kitchen aspirin-acetaminophen-caffeine (EXCEDRIN MIGRAINE) 250-250-65 MG tablet Take 1 tablet by mouth every 6 (six) hours as needed (for pain/headaches.).  Marland Kitchen Diphenhydramine-APAP, sleep, (EXCEDRIN PM) 38-500 MG TABS Take 1-2 tablets by mouth at bedtime as needed (for pain/sleep.).  Marland Kitchen ibuprofen (ADVIL,MOTRIN) 800 MG tablet Take 800 mg by mouth every 8 (eight) hours as needed (for pain.).  Marland Kitchen valACYclovir (VALTREX) 500 MG tablet Take 1 tablet (500 mg total) by mouth 2 (two) times daily.   Allergies  Allergen Reactions  . Penicillins Anaphylaxis    Has patient had a PCN reaction causing immediate rash, facial/tongue/throat swelling, SOB or lightheadedness with hypotension: Yes Has patient had a PCN reaction causing severe rash involving mucus membranes or skin necrosis: Yes Has patient had a PCN reaction that required hospitalization Yes- went to ED, was not admitted Has patient had a PCN reaction occurring within the last 10 years: No- longer then 10 years If all of the above answers are "NO", then may proceed with Cephalosporin use.   . Tape Rash    Other reaction(s): Other (See Comments) Burns skin  . Other Rash    Other reaction(s):  Other (See Comments) **EKG GEL PADS**  "Burns my skin" Pt states she is allergic to a blood pressure medication, unsure.  **EKG GEL PADS**  "Burns my skin"  . Latex Rash   Past Medical History:  Diagnosis Date  . Asthma   . Congenital ventricular septal defect   . Diabetes mellitus without complication ([place])   . Heart murmur   .  Obesity   . Pulmonary hypertension (HCC)    mild   . Tricuspid regurgitation   . VSD (ventricular septal defect), perimembranous    Qp:Qs 1.7:1 by catheterization    Family History  Problem Relation Age of Onset  . Cancer Other   . Hyperlipidemia Other   . Hypertension Other   . Asthma Other   . Diabetes Mother   . Diabetes Father    Past Surgical History:  Procedure Laterality Date  . CARDIAC CATHETERIZATION [name] 10/30/2014   Procedure: Right Heart Cath;  Surgeon: Mackenzie M Martinique, MD;  Location: [facility] INVASIVE CV LAB CUPID;  Service: Cardiovascular;  Laterality: [name];  . CHOLECYSTECTOMY    . CYST EXCISION    . LEFT AND RIGHT HEART CATHETERIZATION WITH CORONARY ANGIOGRAM [name] 09/11/2013   Procedure: LEFT AND RIGHT HEART CATHETERIZATION WITH CORONARY ANGIOGRAM;  Surgeon: Mackenzie Ohara, MD;  Location: [facility] CATH LAB;  Service: Cardiovascular;  Laterality: [name];  . RIGHT HEART CATH [name] 09/02/2016   Procedure: Right Heart Cath;  Surgeon: [name], MD;  Location: [place] CV LAB;  Service: Cardiovascular;  Laterality: [name];  . TUBAL LIGATION     Social History   Social History  . Marital status: Single    Spouse name: [name]  . Number of children: [name]  . Years of education: [name]   Occupational History  . Not on file.   Social History Main Topics  . Smoking status: Never Smoker  . Smokeless tobacco: Never Used  . Alcohol use No  . Drug use: No  . Sexual activity: Yes   Other Topics Concern  . Not on file   Social History Narrative   Lives with boyfriend. On disability.      Review of Systems: General: negative for chills, fever, night sweats or weight changes.  Cardiovascular: negative for chest pain, dyspnea on exertion, edema, orthopnea, palpitations, paroxysmal nocturnal dyspnea or shortness of breath Dermatological: negative for rash Respiratory: negative for cough or wheezing Urologic: negative for hematuria Abdominal: negative for nausea, vomiting, diarrhea, bright  red blood per rectum, melena, or hematemesis Neurologic: negative for visual changes, syncope, or dizziness All other systems reviewed and are otherwise negative except as noted above.   Physical Exam:  Blood pressure 121/78, pulse 89, resp. rate 16, height 4\' 11"  (1.499 m), weight 167 lb (75.8 kg), SpO2 98 %.  General appearance: alert, cooperative and no distress Neck: no carotid bruit and no JVD Lungs: clear to auscultation bilaterally Heart: regular rate and rhythm, no S3 or S4 and loud murmur throughout the precordium that decreases with inspiration Abdomen: soft, non-tender; bowel sounds normal; no masses,  no organomegaly Pulses: 2+ and symmetric Skin: Skin color, texture, turgor normal. No rashes or lesions Neurologic: Grossly normal  EKG not performed -- personally reviewed   ASSESSMENT AND PLAN:   1. ASD: congenitialmembranous ventricular septal defect. She had been referred, in the past,to [facility] where she was offered an option of surgical closure of her VSD or medical management and she decided not to undergo surgery. She has opted to have periodic RHCs to  monitor her VSD. Her most recent [place] was 09/02/16 by Dr. Tamala Patterson. She was found to have moderate pulmonary hypertension without significant change when compared to 2016 data. Pulmonary vascular resistance 2.81 Woods units Qp/Qs = 1.1. It has recently been advised by Dr. Meda Patterson that she primarily needs to be followed by provider that specializes in congenital cardiac defects. She has recommended Dr Mackenzie Patterson from [facility], who comes to [facility] 1-2 x/month. See telephone note from 10/25/16. We will place referral.   2. DM: her PCP recently retired and she is in need of medication assistance. She has Medicaid. Will refer to [facility].   3. Excessive Daytime Sleepiness: Epsworth score is 13. Pt advised to discuss with new PCP the possibility of further evaluation with a sleep study to w/o OSA.     Brittainy  Ladoris Gene, MHS [facility] 11/07/2016 2:11 PM

## 2016-11-07 NOTE — Patient Instructions (Addendum)
Medication Instructions:   Your physician recommends that you continue on your current medications as directed. Please refer to the Current Medication list given to you today.   If you need a refill on your cardiac medications before your next appointment, please call your pharmacy.  Labwork: NONE ORDERED  TODAY    Testing/Procedures: NONE ORDERED  TODAY    Follow-Up: YOU HAVE BEEN REFERRED TO RESEARCH AN PRIMARY CARE PROVIDER.    Any Other Special Instructions Will Be Listed Below (If Applicable).

## 2016-11-08 DIAGNOSIS — E119 Type 2 diabetes mellitus without complications: Secondary | ICD-10-CM | POA: Diagnosis not present

## 2016-11-08 DIAGNOSIS — Q210 Ventricular septal defect: Secondary | ICD-10-CM | POA: Diagnosis not present

## 2016-11-08 DIAGNOSIS — I10 Essential (primary) hypertension: Secondary | ICD-10-CM | POA: Diagnosis not present

## 2016-11-10 ENCOUNTER — Emergency Department (HOSPITAL_COMMUNITY)
Admission: EM | Admit: 2016-11-10 | Discharge: 2016-11-10 | Disposition: A | Discharge status: Home / Self Care | Payer: Medicaid Other | Attending: Emergency Medicine | Admitting: Emergency Medicine

## 2016-11-10 ENCOUNTER — Encounter (HOSPITAL_COMMUNITY): Payer: Self-pay | Admitting: Emergency Medicine

## 2016-11-10 DIAGNOSIS — Z7982 Long term (current) use of aspirin: Secondary | ICD-10-CM | POA: Diagnosis not present

## 2016-11-10 DIAGNOSIS — N939 Abnormal uterine and vaginal bleeding, unspecified: Secondary | ICD-10-CM | POA: Diagnosis present

## 2016-11-10 DIAGNOSIS — Z79899 Other long term (current) drug therapy: Secondary | ICD-10-CM | POA: Diagnosis not present

## 2016-11-10 DIAGNOSIS — J45909 Unspecified asthma, uncomplicated: Secondary | ICD-10-CM | POA: Insufficient documentation

## 2016-11-10 DIAGNOSIS — I10 Essential (primary) hypertension: Secondary | ICD-10-CM | POA: Insufficient documentation

## 2016-11-10 DIAGNOSIS — N921 Excessive and frequent menstruation with irregular cycle: Secondary | ICD-10-CM | POA: Diagnosis not present

## 2016-11-10 DIAGNOSIS — E119 Type 2 diabetes mellitus without complications: Secondary | ICD-10-CM | POA: Insufficient documentation

## 2016-11-10 DIAGNOSIS — Z91040 Latex allergy status: Secondary | ICD-10-CM | POA: Diagnosis not present

## 2016-11-10 LAB — CBC WITH DIFFERENTIAL/PLATELET
BASOS ABS: 0 10*3/uL (ref 0.0–0.1)
Basophils Relative: 0 %
EOS PCT: 2 %
Eosinophils Absolute: 0.1 10*3/uL (ref 0.0–0.7)
HCT: 36.7 % (ref 36.0–46.0)
Hemoglobin: 11.6 g/dL — ABNORMAL LOW (ref 12.0–15.0)
Lymphocytes Relative: 27 %
Lymphs Abs: 1.5 10*3/uL (ref 0.7–4.0)
MCH: 24.1 pg — ABNORMAL LOW (ref 26.0–34.0)
MCHC: 31.6 g/dL (ref 30.0–36.0)
MCV: 76.1 fL — AB (ref 78.0–100.0)
MONO ABS: 0.6 10*3/uL (ref 0.1–1.0)
MONOS PCT: 11 %
Neutro Abs: 3.4 10*3/uL (ref 1.7–7.7)
Neutrophils Relative %: 60 %
PLATELETS: 249 10*3/uL (ref 150–400)
RBC: 4.82 MIL/uL (ref 3.87–5.11)
RDW: 16.9 % — AB (ref 11.5–15.5)
WBC: 5.6 10*3/uL (ref 4.0–10.5)

## 2016-11-10 LAB — RAPID HIV SCREEN (HIV 1/2 AB+AG)
HIV 1/2 Antibodies: NONREACTIVE
HIV-1 P24 Antigen - HIV24: NONREACTIVE

## 2016-11-10 LAB — I-STAT CHEM 8, ED
BUN: 8 mg/dL (ref 6–20)
CALCIUM ION: 1.16 mmol/L (ref 1.15–1.40)
CHLORIDE: 102 mmol/L (ref 101–111)
CREATININE: 0.7 mg/dL (ref 0.44–1.00)
Glucose, Bld: 86 mg/dL (ref 65–99)
HCT: 37 % (ref 36.0–46.0)
Hemoglobin: 12.6 g/dL (ref 12.0–15.0)
Potassium: 3.8 mmol/L (ref 3.5–5.1)
Sodium: 138 mmol/L (ref 135–145)
TCO2: 25 mmol/L (ref 0–100)

## 2016-11-10 LAB — WET PREP, GENITAL
SPERM: NONE SEEN
TRICH WET PREP: NONE SEEN
WBC WET PREP: NONE SEEN
YEAST WET PREP: NONE SEEN

## 2016-11-10 LAB — RPR: RPR Ser Ql: NONREACTIVE

## 2016-11-10 NOTE — ED Triage Notes (Signed)
Pt states she started her period a week ago Monday and is still bleeding  Pt states she has off and on cramping  Pt states it is lasting longer than normal and she has been passing clots  Pt says the flow has varied from heavy to light

## 2016-11-10 NOTE — ED Notes (Signed)
PA/RN notified of lab result

## 2016-11-10 NOTE — ED Notes (Signed)
Patient is alert and oriented x3.  She was given DC instructions and follow up visit instructions.  Patient gave verbal understanding. She was DC ambulatory under her own power to home.  V/S stable.  He was not showing any signs of distress on DC 

## 2016-11-10 NOTE — ED Provider Notes (Signed)
[facility] DEPT Provider Note   CSN: [id number] Arrival date & time: 11/10/16  0549     History   Chief Complaint Chief Complaint  Patient presents with  . Vaginal Bleeding    HPI Mackenzie Patterson is a 46 y.o. female.  HPI   46 year old obese female with history of diabetes, hypertension, asthma, VSD presenting for evaluation of vaginal bleeding. Patient states she normally have prolonged menstrual period usually lasting for weeks. However, this recent menstrual period has been ongoing for the past 2 weeks. States that she normally have to change tampon at least 10 times a day which is usual for her. For the past 4 days her bleeding seems to be improved except today she noticed increased bleeding with clots which concerns her. She denies any associated lightheadedness, dizziness, abdominal pain, back pain, vaginal discharge, abnormal odor. Denies any change in sexual partner. She did start taking baby aspirin per recommendation of cardiologist 3 weeks ago.  Past Medical History:  Diagnosis Date  . Asthma   . Congenital ventricular septal defect   . Diabetes mellitus without complication ([place])   . Heart murmur   . Obesity   . Pulmonary hypertension (HCC)    mild   . Tricuspid regurgitation   . VSD (ventricular septal defect), perimembranous    Qp:Qs 1.7:1 by catheterization     Patient Active Problem List   Diagnosis Date Noted  . Ventricular septal defect (VSD), membranous 09/02/2016  . Pulmonary hypertension, primary ([place]) 08/23/2016  . Chest tightness 08/03/2016  . Cough 08/03/2016  . Dyspnea and respiratory abnormality 04/16/2014  . Tricuspid regurgitation   . Asthma   . VSD (ventricular septal defect), perimembranous 09/03/2013  . Pulmonary hypertension ([place]) 09/03/2013  . Essential hypertension 08/06/2013  . Diabetes mellitus ([place]) 08/06/2013  . Obesity 08/06/2013  . Hypertension 07/27/2012  . Awareness of heartbeats 07/27/2012  . Absence of interventricular  septum 07/27/2012  . Perimembranous ventricular septal defect 07/27/2012    Past Surgical History:  Procedure Laterality Date  . CARDIAC CATHETERIZATION [name] 10/30/2014   Procedure: Right Heart Cath;  Surgeon: [name], MD;  Location: [facility] INVASIVE CV LAB CUPID;  Service: Cardiovascular;  Laterality: [name];  . CHOLECYSTECTOMY    . CYST EXCISION    . LEFT AND RIGHT HEART CATHETERIZATION WITH CORONARY ANGIOGRAM [name] 09/11/2013   Procedure: LEFT AND RIGHT HEART CATHETERIZATION WITH CORONARY ANGIOGRAM;  Surgeon: [name], MD;  Location: [facility] CATH LAB;  Service: Cardiovascular;  Laterality: [name];  . RIGHT HEART CATH [name] 09/02/2016   Procedure: Right Heart Cath;  Surgeon: [name], MD;  Location: [place] CV LAB;  Service: Cardiovascular;  Laterality: [name];  . TUBAL LIGATION      OB History    No data available       Home Medications    Prior to Admission medications   Medication Sig Start Date End Date Taking? Authorizing Provider  Alogliptin-Metformin HCl (KAZANO) 12.10-998 MG TABS Take 1 tablet by mouth daily.    Yes [provider]  amLODipine (NORVASC) 5 MG tablet Take 5 mg by mouth daily.  08/19/14  Yes [provider]  aspirin 81 MG chewable tablet Chew by mouth daily.   Yes [provider]  aspirin-acetaminophen-caffeine (EXCEDRIN MIGRAINE) 270-660-2603 MG tablet Take 1 tablet by mouth every 6 (six) hours as needed (for pain/headaches.).   Yes [provider]  Diphenhydramine-APAP, sleep, (EXCEDRIN PM) 38-500 MG TABS Take 1-2 tablets by mouth at bedtime as needed (for  pain/sleep.).   Yes [provider]    Family History Family History  Problem Relation Age of Onset  . Cancer Other   . Hyperlipidemia Other   . Hypertension Other   . Asthma Other   . Diabetes Mother   . Diabetes Father     Social History Social History  Substance Use Topics  . Smoking status: Never Smoker  . Smokeless tobacco: Never Used  . Alcohol  use No     Allergies   Penicillins; Tape; Other; and Latex   Review of Systems Review of Systems  All other systems reviewed and are negative.    Physical Exam Updated Vital Signs BP (!) 147/96 (BP Location: Left Arm)   Pulse 85   Temp 97.9 F (36.6 C) (Oral)   Resp 18   Ht 4\' 11"  (1.499 m)   Wt 75.8 kg   LMP 10/31/2016 (Exact Date)   SpO2 100%   BMI 33.73 kg/m   Physical Exam  Constitutional: She appears well-developed and well-nourished. No distress.  HENT:  Head: Atraumatic.  Eyes: Conjunctivae are normal.  Neck: Neck supple.  Cardiovascular: Normal rate and regular rhythm.   Murmur (4/6 systolic murmur) heard. Pulmonary/Chest: Effort normal and breath sounds normal.  Abdominal: Soft. Bowel sounds are normal. She exhibits no distension. There is no tenderness.  Genitourinary:  Genitourinary Comments: Chaperone present during exam. Normal external genitalia. No pain with speculum insertion. Normal vaginal vault with small amount of blood noted no clots. Close cervical os free of lesion or rash. No vaginal discharge noted. On bimanual examination no adnexal tenderness or cervical motion tenderness  Neurological: She is alert.  Skin: No rash noted.  Psychiatric: She has a normal mood and affect.  Nursing note and vitals reviewed.    ED Treatments / Results  Labs (all labs ordered are listed, but only abnormal results are displayed) Labs Reviewed  WET PREP, GENITAL - Abnormal; Notable for the following:       Result Value   Clue Cells Wet Prep HPF POC PRESENT (*)    All other components within normal limits  CBC WITH DIFFERENTIAL/PLATELET - Abnormal; Notable for the following:    Hemoglobin 11.6 (*)    MCV 76.1 (*)    MCH 24.1 (*)    RDW 16.9 (*)    All other components within normal limits  RAPID HIV SCREEN (HIV 1/2 AB+AG)  RPR  I-STAT CHEM 8, ED  POC URINE PREG, ED  GC/CHLAMYDIA PROBE AMP ([place]) NOT AT [facility]    EKG  EKG  Interpretation None       Radiology No results found.  Procedures Procedures (including critical care time)  Medications Ordered in ED Medications - No data to display   Initial Impression / Assessment and Plan / ED Course  I have reviewed the triage vital signs and the nursing notes.  Pertinent labs & imaging results that were available during my care of the patient were reviewed by me and considered in my medical decision making (see chart for details).     BP (!) 147/96 (BP Location: Left Arm)   Pulse 85   Temp 97.9 F (36.6 C) (Oral)   Resp 18   Ht 4\' 11"  (1.499 m)   Wt 75.8 kg   LMP 10/31/2016 (Exact Date)   SpO2 100%   BMI 33.73 kg/m    Final Clinical Impressions(s) / ED Diagnoses   Final diagnoses:  Menorrhagia with irregular cycle    New Prescriptions  New Prescriptions   No medications on file   7:05 AM Patient here with complaints of menorrhagia. She is well-appearing, vital signs stable, no tachycardia. We'll perform screening exam including pelvic exam, And we'll check H&H, as well as pregnancy test.   8:11 AM No significant discomfort with pelvic examination. Small amount of blood noted in vaginal vault.  9:39 AM Although wet prep has some clue cells, I do not think patient has BV as she does not have any other symptoms corresponding with BV. Her hemoglobin is 11.6. She is asymptomatic. At this time, I encouraged patient to follow-up outpatient for further evaluation of her menorrhagia. Return precaution discussed. Patient is stable for discharge.   Domenic Moras, PA-C 11/10/16 0940    Margette Fast, MD 11/11/16 [phone]

## 2016-11-11 LAB — GC/CHLAMYDIA PROBE AMP (~~LOC~~) NOT AT ARMC
CHLAMYDIA, DNA PROBE: NEGATIVE
Neisseria Gonorrhea: NEGATIVE

## 2016-11-14 ENCOUNTER — Ambulatory Visit (INDEPENDENT_AMBULATORY_CARE_PROVIDER_SITE_OTHER): Payer: Medicaid Other | Admitting: Physician Assistant

## 2016-11-14 ENCOUNTER — Encounter (INDEPENDENT_AMBULATORY_CARE_PROVIDER_SITE_OTHER): Payer: Self-pay | Admitting: Physician Assistant

## 2016-11-14 VITALS — BP 143/88 | HR 91 | Temp 97.8°F | Wt 168.2 lb

## 2016-11-14 DIAGNOSIS — B9689 Other specified bacterial agents as the cause of diseases classified elsewhere: Secondary | ICD-10-CM

## 2016-11-14 DIAGNOSIS — R6889 Other general symptoms and signs: Secondary | ICD-10-CM | POA: Diagnosis not present

## 2016-11-14 DIAGNOSIS — N926 Irregular menstruation, unspecified: Secondary | ICD-10-CM

## 2016-11-14 DIAGNOSIS — R5383 Other fatigue: Secondary | ICD-10-CM

## 2016-11-14 DIAGNOSIS — R011 Cardiac murmur, unspecified: Secondary | ICD-10-CM

## 2016-11-14 DIAGNOSIS — N760 Acute vaginitis: Secondary | ICD-10-CM | POA: Diagnosis not present

## 2016-11-14 DIAGNOSIS — Z23 Encounter for immunization: Secondary | ICD-10-CM

## 2016-11-14 DIAGNOSIS — E119 Type 2 diabetes mellitus without complications: Secondary | ICD-10-CM | POA: Diagnosis not present

## 2016-11-14 LAB — POCT URINALYSIS DIPSTICK
Glucose, UA: 100
KETONES UA: NEGATIVE
Leukocytes, UA: NEGATIVE
Nitrite, UA: NEGATIVE
PH UA: 5.5 (ref 5.0–8.0)
Urobilinogen, UA: 0.2 E.U./dL

## 2016-11-14 LAB — POCT GLYCOSYLATED HEMOGLOBIN (HGB A1C): Hemoglobin A1C: 5.8

## 2016-11-14 LAB — POCT URINE PREGNANCY: PREG TEST UR: NEGATIVE

## 2016-11-14 MED ORDER — METRONIDAZOLE 500 MG PO TABS: 500.0000 mg | ORAL_TABLET | Freq: Two times a day (BID) | ORAL | 0 refills | Status: AC

## 2016-11-14 MED ORDER — ALOGLIPTIN-METFORMIN HCL 12.5-1000 MG PO TABS: 1.0000 | ORAL_TABLET | Freq: Every day | ORAL | 3 refills | Status: DC

## 2016-11-14 NOTE — Progress Notes (Signed)
Subjective:  [name], female    DOB: 14-Sep-1970  Age: 46 y.o. MRN: [id number]  CC: menstrual irregularities  HPI Mackenzie Patterson is a 46 y.o. female with a PMH of asthma, DM2, pulmonary hypertension, VSD, and tricuspid regurgitation presents with complaint of menometrorrhagia. Onset of heavy and prolonged menstrual flow since this month. Never had experienced this type of menstrual period. Saw some blood clots on the tampon. Not associated with pain. Still some spotting now.     Last laboratory result from ED revealed BV but has not heard of this result. Does not endorse vaginal complaints besides the menometrorrhagia.    Lastly, she would like to have a refill of her antiglycemic medicine. Takes combo alogliptin and metformin.  Intolerance to metformin/metformin XR alone and to glimepiride. A1c 5.8 in clinic today. Does not endorse any other symptoms.      Outpatient Medications Prior to Visit  Medication Sig Dispense Refill  . amLODipine (NORVASC) 5 MG tablet Take 5 mg by mouth daily.   4  . aspirin 81 MG chewable tablet Chew by mouth daily.    Marland Kitchen aspirin-acetaminophen-caffeine (EXCEDRIN MIGRAINE) 250-250-65 MG tablet Take 1 tablet by mouth every 6 (six) hours as needed (for pain/headaches.).    Marland Kitchen Diphenhydramine-APAP, sleep, (EXCEDRIN PM) 38-500 MG TABS Take 1-2 tablets by mouth at bedtime as needed (for pain/sleep.).    Marland Kitchen Alogliptin-Metformin HCl (KAZANO) 12.10-998 MG TABS Take 1 tablet by mouth daily.      No facility-administered medications prior to visit.      ROS Review of Systems  Constitutional: Positive for malaise/fatigue. Negative for chills and fever.  Eyes: Negative for blurred vision.  Respiratory: Negative for shortness of breath.   Cardiovascular: Negative for chest pain and palpitations.  Gastrointestinal: Negative for abdominal pain and nausea.  Genitourinary: Negative for dysuria and hematuria.       Menometrorrhagia  Musculoskeletal: Negative for  joint pain and myalgias.  Skin: Negative for rash.  Neurological: Negative for tingling and headaches.  Endo/Heme/Allergies:       Heat intolerance  Psychiatric/Behavioral: Negative for depression. The patient is not nervous/anxious.     Objective:  BP (!) 143/88 (BP Location: Left Arm, Patient Position: Sitting, Cuff Size: Normal)   Pulse 91   Temp 97.8 F (36.6 C) (Oral)   Wt 168 lb 3.2 oz (76.3 kg)   LMP 10/31/2016 (Exact Date)   SpO2 96%   BMI 33.97 kg/m   BP/Weight 11/14/2016 11/10/2016 10/13/3788  Systolic BP 240 973 532  Diastolic BP 88 96 78  Wt. (Lbs) 168.2 167 167  BMI 33.97 33.73 33.73      Physical Exam  Constitutional: She is oriented to person, place, and time.  Well developed, obese, NAD, polite  HENT:  Head: Normocephalic and atraumatic.  Eyes: No scleral icterus.  Neck: Normal range of motion. Neck supple. No thyromegaly present.  Cardiovascular: Normal rate and regular rhythm.   3/6 holosystolic murmur  Pulmonary/Chest: Effort normal and breath sounds normal.  Abdominal: Soft. Bowel sounds are normal. There is tenderness (Mild TTP in the LLQ and left adnexal region).  Musculoskeletal: She exhibits no edema.  Lymphadenopathy:    She has no cervical adenopathy.  Neurological: She is alert and oriented to person, place, and time. No cranial nerve deficit. Coordination normal.  Skin: Skin is warm and dry. No rash noted. No erythema. No pallor.  Psychiatric: She has a normal mood and affect. Her behavior is normal. Thought content normal.  Vitals  reviewed.    Assessment & Plan:   1. Irregular menses - POCT urine pregnancy negative in clinic today - US Pelvis Complete; Future - US Transvaginal Non-OB; Future  2. Need for Tdap vaccination - Tdap vaccine greater than or equal to 7yo IM  3. BV (bacterial vaginosis) - Begin metroNIDAZOLE (FLAGYL) 500 MG tablet; Take 1 tablet (500 mg total) by mouth 2 (two) times daily.  Dispense: 14 tablet; Refill:  0  4. Heat intolerance - TSH  5. Other fatigue - TSH - Urinalysis Dipstick  6. Type 2 diabetes mellitus without complication, without long-term current use of insulin (HCC) - HgB A1c - Refill Alogliptin-Metformin HCl (KAZANO) 12.10-998 MG TABS; Take 1 tablet by mouth daily.  Dispense: 90 tablet; Refill: 3  7. Murmur - Ambulatory referral to Cardiology   Meds ordered this encounter  Medications  . metroNIDAZOLE (FLAGYL) 500 MG tablet    Sig: Take 1 tablet (500 mg total) by mouth 2 (two) times daily.    Dispense:  14 tablet    Refill:  0    Order Specific Question:   Supervising Provider    Answer:   Tresa Garter [id number]  . Alogliptin-Metformin HCl (KAZANO) 12.10-998 MG TABS    Sig: Take 1 tablet by mouth daily.    Dispense:  90 tablet    Refill:  3    Order Specific Question:   Supervising Provider    Answer:   Tresa Garter [id number]    Follow-up: Return in about 4 weeks (around 12/12/2016) for irregular menses.   Mackenzie Demark PA

## 2016-11-14 NOTE — Patient Instructions (Signed)
Bacterial Vaginosis Bacterial vaginosis is a vaginal infection that occurs when the normal balance of bacteria in the vagina is disrupted. It results from an overgrowth of certain bacteria. This is the most common vaginal infection among women ages 15-44. Because bacterial vaginosis increases your risk for STIs (sexually transmitted infections), getting treated can help reduce your risk for chlamydia, gonorrhea, herpes, and HIV (human immunodeficiency virus). Treatment is also important for preventing complications in pregnant women, because this condition can cause an early (premature) delivery. What are the causes? This condition is caused by an increase in harmful bacteria that are normally present in small amounts in the vagina. However, the reason that the condition develops is not fully understood. What increases the risk? The following factors may make you more likely to develop this condition:  Having a new sexual partner or multiple sexual partners.  Having unprotected sex.  Douching.  Having an intrauterine device (IUD).  Smoking.  Drug and alcohol abuse.  Taking certain antibiotic medicines.  Being pregnant.  You cannot get bacterial vaginosis from toilet seats, bedding, swimming pools, or contact with objects around you. What are the signs or symptoms? Symptoms of this condition include:  Grey or white vaginal discharge. The discharge can also be watery or foamy.  A fish-like odor with discharge, especially after sexual intercourse or during menstruation.  Itching in and around the vagina.  Burning or pain with urination.  Some women with bacterial vaginosis have no signs or symptoms. How is this diagnosed? This condition is diagnosed based on:  Your medical history.  A physical exam of the vagina.  Testing a sample of vaginal fluid under a microscope to look for a large amount of bad bacteria or abnormal cells. Your health care provider may use a cotton swab  or a small wooden spatula to collect the sample.  How is this treated? This condition is treated with antibiotics. These may be given as a pill, a vaginal cream, or a medicine that is put into the vagina (suppository). If the condition comes back after treatment, a second round of antibiotics may be needed. Follow these instructions at home: Medicines  Take over-the-counter and prescription medicines only as told by your health care provider.  Take or use your antibiotic as told by your health care provider. Do not stop taking or using the antibiotic even if you start to feel better. General instructions  If you have a female sexual partner, tell her that you have a vaginal infection. She should see her health care provider and be treated if she has symptoms. If you have a female sexual partner, he does not need treatment.  During treatment: ? Avoid sexual activity until you finish treatment. ? Do not douche. ? Avoid alcohol as directed by your health care provider. ? Avoid breastfeeding as directed by your health care provider.  Drink enough water and fluids to keep your urine clear or pale yellow.  Keep the area around your vagina and rectum clean. ? Wash the area daily with warm water. ? Wipe yourself from front to back after using the toilet.  Keep all follow-up visits as told by your health care provider. This is important. How is this prevented?  Do not douche.  Wash the outside of your vagina with warm water only.  Use protection when having sex. This includes latex condoms and dental dams.  Limit how many sexual partners you have. To help prevent bacterial vaginosis, it is best to have sex with just   one partner (monogamous).  Make sure you and your sexual partner are tested for STIs.  Wear cotton or cotton-lined underwear.  Avoid wearing tight pants and pantyhose, especially during summer.  Limit the amount of alcohol that you drink.  Do not use any products that  contain nicotine or tobacco, such as cigarettes and e-cigarettes. If you need help quitting, ask your health care provider.  Do not use illegal drugs. Where to find more information:  Centers for Disease Control and Prevention: www.cdc.gov/std  American Sexual Health Association (ASHA): www.ashastd.org  [place] Department of Health and Human Services, Office on Women's Health: www.womenshealth.gov/ or https://www.womenshealth.gov/a-z-topics/bacterial-vaginosis Contact a health care provider if:  Your symptoms do not improve, even after treatment.  You have more discharge or pain when urinating.  You have a fever.  You have pain in your abdomen.  You have pain during sex.  You have vaginal bleeding between periods. Summary  Bacterial vaginosis is a vaginal infection that occurs when the normal balance of bacteria in the vagina is disrupted.  Because bacterial vaginosis increases your risk for STIs (sexually transmitted infections), getting treated can help reduce your risk for chlamydia, gonorrhea, herpes, and HIV (human immunodeficiency virus). Treatment is also important for preventing complications in pregnant women, because the condition can cause an early (premature) delivery.  This condition is treated with antibiotic medicines. These may be given as a pill, a vaginal cream, or a medicine that is put into the vagina (suppository). This information is not intended to replace advice given to you by your health care provider. Make sure you discuss any questions you have with your health care provider. Document Released: 06/13/2005 Document Revised: 02/27/2016 Document Reviewed: 02/27/2016 Elsevier Interactive Patient Education  2017 [employer]vier Inc.  

## 2016-11-15 ENCOUNTER — Telehealth (INDEPENDENT_AMBULATORY_CARE_PROVIDER_SITE_OTHER): Payer: Self-pay | Admitting: Physician Assistant

## 2016-11-15 LAB — MICROALBUMIN / CREATININE URINE RATIO
Creatinine, Urine: 335.5 mg/dL
MICROALBUM., U, RANDOM: 15 ug/mL
Microalb/Creat Ratio: 4.5 mg/g creat (ref 0.0–30.0)

## 2016-11-15 LAB — TSH: TSH: 2.18 u[IU]/mL (ref 0.450–4.500)

## 2016-11-15 NOTE — Telephone Encounter (Signed)
Mackenzie Patterson with [facility] called stated needs Precertification for [name].   Please call back

## 2016-11-16 ENCOUNTER — Telehealth (INDEPENDENT_AMBULATORY_CARE_PROVIDER_SITE_OTHER): Payer: Self-pay | Admitting: Physician Assistant

## 2016-11-16 NOTE — Telephone Encounter (Signed)
Spoke with melanie and gave her the authorization number [id number]

## 2016-11-16 NOTE — Telephone Encounter (Signed)
Melanie with [facility] called stated needs precertification for [name] order,  Patient has appt tomorrow .  Please call her back

## 2016-11-17 ENCOUNTER — Ambulatory Visit (HOSPITAL_COMMUNITY)
Admission: RE | Admit: 2016-11-17 | Discharge: 2016-11-17 | Disposition: A | Discharge status: Home / Self Care | Payer: Medicaid Other | Source: Ambulatory Visit | Attending: Physician Assistant | Admitting: Physician Assistant

## 2016-11-17 DIAGNOSIS — R938 Abnormal findings on diagnostic imaging of other specified body structures: Secondary | ICD-10-CM | POA: Diagnosis not present

## 2016-11-17 DIAGNOSIS — N926 Irregular menstruation, unspecified: Secondary | ICD-10-CM | POA: Insufficient documentation

## 2016-11-22 ENCOUNTER — Telehealth (INDEPENDENT_AMBULATORY_CARE_PROVIDER_SITE_OTHER): Payer: Self-pay | Admitting: Physician Assistant

## 2016-11-22 NOTE — Telephone Encounter (Signed)
Prior authorization needed for patients medication

## 2016-11-22 NOTE — Telephone Encounter (Signed)
Patient called stated need Alogliptin-Metformin HCl (KAZANO) 12.10-998 MG TABS Need Prior authorization.  Please follow up with patient.

## 2016-11-23 ENCOUNTER — Other Ambulatory Visit (INDEPENDENT_AMBULATORY_CARE_PROVIDER_SITE_OTHER): Payer: Self-pay | Admitting: Physician Assistant

## 2016-11-23 DIAGNOSIS — R9389 Abnormal findings on diagnostic imaging of other specified body structures: Secondary | ICD-10-CM

## 2016-11-23 DIAGNOSIS — R935 Abnormal findings on diagnostic imaging of other abdominal regions, including retroperitoneum: Principal | ICD-10-CM

## 2016-11-23 NOTE — Progress Notes (Signed)
Abnormal transvaginal US.

## 2016-11-24 NOTE — Telephone Encounter (Signed)
I have already done this prior authorization last week. Tell patient to call pharmacy and/or call [facility]. Thank you.

## 2016-11-24 NOTE — Telephone Encounter (Signed)
FWD to PCP. Tempestt S Roberts, CMA  

## 2016-11-24 NOTE — Telephone Encounter (Signed)
Patient informed. Tempestt S Roberts, CMA  

## 2016-11-30 ENCOUNTER — Ambulatory Visit (INDEPENDENT_AMBULATORY_CARE_PROVIDER_SITE_OTHER): Payer: Medicaid Other | Admitting: Physician Assistant

## 2016-11-30 ENCOUNTER — Encounter (INDEPENDENT_AMBULATORY_CARE_PROVIDER_SITE_OTHER): Payer: Self-pay | Admitting: Physician Assistant

## 2016-11-30 VITALS — BP 123/82 | HR 93 | Temp 98.3°F | Wt 166.0 lb

## 2016-11-30 DIAGNOSIS — M7662 Achilles tendinitis, left leg: Secondary | ICD-10-CM | POA: Diagnosis not present

## 2016-11-30 DIAGNOSIS — N6452 Nipple discharge: Secondary | ICD-10-CM | POA: Diagnosis not present

## 2016-11-30 DIAGNOSIS — E119 Type 2 diabetes mellitus without complications: Secondary | ICD-10-CM | POA: Diagnosis not present

## 2016-11-30 MED ORDER — NAPROXEN 500 MG PO TABS: 500.0000 mg | ORAL_TABLET | Freq: Two times a day (BID) | ORAL | 0 refills | Status: DC

## 2016-11-30 MED ORDER — SITAGLIPTIN PHOS-METFORMIN HCL 50-1000 MG PO TABS: 1.0000 | ORAL_TABLET | Freq: Two times a day (BID) | ORAL | 11 refills | Status: DC

## 2016-11-30 NOTE — Progress Notes (Signed)
Subjective:  [name], female    DOB: 28-Aug-1970  Age: 46 y.o. MRN: [id number]  CC: f/u dm  HPI Mackenzie Patterson is a 46 y.o. female with a PMH of DM2, VSD, and asthma presents to f/u on DM2. She has been out of [place] for approximately two weeks. Has noted that her blood sugars are steadily increasing. Visual blurring over the last 3 days. Received a letter from [facility] denying her request for Colvin Caroli since this medication is on the non preferred list. A1c 5.8% on 11/14/16.    Patient also complains of continue menstrual spotting. The pelvic/transvaginal US revealed suspicion for endometrial hyperplasia with recommendation for further imaging. Recent labs showed normal TSH. Patient also states that she has left sided nipple discharge. Fluid is clear. Has not had a mammogram in a few years. No pain or redness.    Lastly, complains of left sided achilles pain with prolonged walking. Has not taken anything for relief. Denies trauma,  increased activities, swelling, redness, or tingling/numbness.       Outpatient Medications Prior to Visit  Medication Sig Dispense Refill  . Alogliptin-Metformin HCl (KAZANO) 12.10-998 MG TABS Take 1 tablet by mouth daily. 90 tablet 3  . amLODipine (NORVASC) 5 MG tablet Take 5 mg by mouth daily.   4  . aspirin 81 MG chewable tablet Chew by mouth daily.    Marland Kitchen aspirin-acetaminophen-caffeine (EXCEDRIN MIGRAINE) 250-250-65 MG tablet Take 1 tablet by mouth every 6 (six) hours as needed (for pain/headaches.).    Marland Kitchen Diphenhydramine-APAP, sleep, (EXCEDRIN PM) 38-500 MG TABS Take 1-2 tablets by mouth at bedtime as needed (for pain/sleep.).     No facility-administered medications prior to visit.      ROS Review of Systems  Constitutional: Negative for chills, fever and malaise/fatigue.  Eyes: Positive for blurred vision.  Respiratory: Negative for shortness of breath.   Cardiovascular: Negative for chest pain and palpitations.  Gastrointestinal: Negative for  abdominal pain and nausea.  Genitourinary: Negative for dysuria and hematuria.       Menstrual spotting  Musculoskeletal: Negative for joint pain and myalgias.       Left sided achilles pain  Skin: Negative for rash.  Neurological: Negative for tingling and headaches.  Endo/Heme/Allergies:       Nipple discharge  Psychiatric/Behavioral: Negative for depression. The patient is not nervous/anxious.     Objective:  BP 123/82 (BP Location: Left Arm, Patient Position: Sitting, Cuff Size: Normal)   Pulse 93   Temp 98.3 F (36.8 C) (Oral)   Wt 166 lb (75.3 kg)   LMP 11/02/2016 Comment: still spotting from cycle   PF 96 L/min   BMI 33.53 kg/m   BP/Weight 11/30/2016 11/14/2016 12/16/2977  Systolic BP 892 119 417  Diastolic BP 82 88 96  Wt. (Lbs) 166 168.2 167  BMI 33.53 33.97 33.73      Physical Exam  Constitutional: She is oriented to person, place, and time.  Well developed, overweight, NAD, reserved  HENT:  Head: Normocephalic and atraumatic.  Eyes: No scleral icterus.  Cardiovascular: Normal rate, regular rhythm and normal heart sounds.   Pulmonary/Chest: Effort normal and breath sounds normal.  Musculoskeletal: She exhibits no edema.  Left achilles without erythema, edema, ecchymosis, or nodule. Mildly tender to palpation.  Neurological: She is alert and oriented to person, place, and time. No cranial nerve deficit. Coordination normal.  Skin: Skin is warm and dry. No rash noted. No erythema. No pallor.  Psychiatric: She has a normal mood  and affect. Her behavior is normal. Thought content normal.  Vitals reviewed.    Assessment & Plan:   1. Type 2 diabetes mellitus without complication, without long-term current use of insulin (HCC) - Begin sitaGLIPtin-metformin (JANUMET) 50-1000 MG tablet; Take 1 tablet by mouth 2 (two) times daily with a meal.  Dispense: 60 tablet; Refill: 11 - FSH/LH; Future  2. Nipple discharge - MM Digital Diagnostic Unilat L; Future -  Prolactin; Future  3. Achilles tendinitis of left lower extremity - Begin naproxen (NAPROSYN) 500 MG tablet; Take 1 tablet (500 mg total) by mouth 2 (two) times daily with a meal.  Dispense: 30 tablet; Refill: 0   Meds ordered this encounter  Medications  . sitaGLIPtin-metformin (JANUMET) 50-1000 MG tablet    Sig: Take 1 tablet by mouth 2 (two) times daily with a meal.    Dispense:  60 tablet    Refill:  11    Order Specific Question:   Supervising Provider    Answer:   Tresa Garter [[id number]]  . naproxen (NAPROSYN) 500 MG tablet    Sig: Take 1 tablet (500 mg total) by mouth 2 (two) times daily with a meal.    Dispense:  30 tablet    Refill:  0    Order Specific Question:   Supervising Provider    Answer:   Tresa Garter [[id number]]    Follow-up: Return in about 3 months (around 03/02/2017) for DM.   Clent Demark PA

## 2016-11-30 NOTE — Patient Instructions (Signed)
Achilles Tendinitis  Achilles tendinitis is inflammation of the tough, cord-like band that attaches the lower leg muscles to the heel bone (Achilles tendon). This is usually caused by overusing the tendon and the ankle joint.  Achilles tendinitis usually gets better over time with treatment and caring for yourself at home. It can take weeks or months to heal completely.  What are the causes?  This condition may be caused by:  · A sudden increase in exercise or activity, such as running.  · Doing the same exercises or activities (such as jumping) over and over.  · Not warming up calf muscles before exercising.  · Exercising in shoes that are worn out or not made for exercise.  · Having arthritis or a bone growth (spur) on the back of the heel bone. This can rub against the tendon and hurt it.  · Age-related wear and tear. Tendons become less flexible with age and more likely to be injured.    What are the signs or symptoms?  Common symptoms of this condition include:  · Pain in the Achilles tendon or in the back of the leg, just above the heel. The pain usually gets worse with exercise.  · Stiffness or soreness in the back of the leg, especially in the morning.  · Swelling of the skin over the Achilles tendon.  · Thickening of the tendon.  · Bone spurs at the bottom of the Achilles tendon, near the heel.  · Trouble standing on tiptoe.    How is this diagnosed?  This condition is diagnosed based on your symptoms and a physical exam. You may have tests, including:  · X-rays.  · MRI.    How is this treated?  The goal of treatment is to relieve symptoms and help your injury heal. Treatment may include:  · Decreasing or stopping activities that caused the tendinitis. This may mean switching to low-impact exercises like biking or swimming.  · Icing the injured area.  · Doing physical therapy, including strengthening and stretching exercises.  · NSAIDs to help relieve pain and swelling.  · Using supportive shoes, wraps,  heel lifts, or a walking boot (air cast).  · Surgery. This may be done if your symptoms do not improve after 6 months.  · Using high-energy shock wave impulses to stimulate the healing process (extracorporeal shock wave therapy). This is rare.  · Injection of medicines to help relieve inflammation (corticosteroids). This is rare.    Follow these instructions at home:  If you have an air cast:   · Wear the cast as told by your health care provider. Remove it only as told by your health care provider.  · Loosen the cast if your toes tingle, become numb, or turn cold and blue.  Activity   · Gradually return to your normal activities once your health care provider approves. Do not do activities that cause pain.  ? Consider doing low-impact exercises, like cycling or swimming.  · If you have an air cast, ask your health care provider when it is safe for you to drive.  · If physical therapy was prescribed, do exercises as told by your health care provider or physical therapist.  Managing pain, stiffness, and swelling   · Raise (elevate) your foot above the level of your heart while you are sitting or lying down.  · Move your toes often to avoid stiffness and to lessen swelling.  · If directed, put ice on the injured area:  ?   Put ice in a plastic bag.  ? Place a towel between your skin and the bag.  ? Leave the ice on for 20 minutes, 2-3 times a day  General instructions   · If directed, wrap your foot with an elastic bandage or other wrap. This can help keep your tendon from moving too much while it heals. Your health care provider will show you how to wrap your foot correctly.  · Wear supportive shoes or heel lifts only as told by your health care provider.  · Take over-the-counter and prescription medicines only as told by your health care provider.  · Keep all follow-up visits as told by your health care provider. This is important.  Contact a health care provider if:  · You have symptoms that gets worse.  · You have  pain that does not get better with medicine.  · You develop new, unexplained symptoms.  · You develop warmth and swelling in your foot.  · You have a fever.  Get help right away if:  · You have a sudden popping sound or sensation in your Achilles tendon followed by severe pain.  · You cannot move your toes or foot.  · You cannot put any weight on your foot.  Summary  · Achilles tendinitis is inflammation of the tough, cord-like band that attaches the lower leg muscles to the heel bone (Achilles tendon).  · This condition is usually caused by overusing the tendon and the ankle joint. It can also be caused by arthritis or normal aging.  · The most common symptoms of this condition include pain, swelling, or stiffness in the Achilles tendon or in the back of the leg.  · This condition is usually treated with rest, NSAIDs, and physical therapy.  This information is not intended to replace advice given to you by your health care provider. Make sure you discuss any questions you have with your health care provider.  Document Released: 03/23/2005 Document Revised: 05/02/2016 Document Reviewed: 05/02/2016  Elsevier Interactive Patient Education © 2017 Elsevier Inc.

## 2016-12-12 ENCOUNTER — Encounter: Payer: Self-pay | Admitting: Obstetrics & Gynecology

## 2016-12-12 ENCOUNTER — Ambulatory Visit (INDEPENDENT_AMBULATORY_CARE_PROVIDER_SITE_OTHER): Payer: Medicaid Other | Admitting: Obstetrics & Gynecology

## 2016-12-12 DIAGNOSIS — N920 Excessive and frequent menstruation with regular cycle: Secondary | ICD-10-CM | POA: Diagnosis present

## 2016-12-12 NOTE — Patient Instructions (Signed)
Sonohysterogram A sonohysterogram is a procedure to examine the inside of the uterus. This exam uses sound waves that are sent to a computer to make images of the lining of the uterus (endometrium). To get the best images, a germ-free, salt-water solution (sterile saline) is put into the uterus through the vagina. You may have this procedure if you have certain reproductive problems, such as abnormal bleeding, infertility, or miscarriage. This procedure can show what may be causing these problems. Possible causes include scarring or abnormal growths such as fibroids inside your uterus. It can also show if your uterus is an abnormal shape or if the lining of the uterus is too thin. Tell a health care provider about:  All medicines you are taking, including vitamins, herbs, eye drops, creams, and over-the-counter medicines.  Any allergies you have.  Any blood disorders you have.  Any surgeries you have had.  Any medical conditions you have.  Whether you are pregnant or may be pregnant.  The date of the first day of your last period.  Any signs of infection, such as fever, pain in your lower abdomen, or abnormal discharge from your vagina. What are the risks? Generally, this is a safe procedure. However, problems may occur, including:  Abdominal pain or cramping.  Light bleeding (spotting).  Increased vaginal discharge.  Infection. What happens before the procedure?  Your health care provider may have you take an over-the-counter pain medicine.  You may be given medicine to stop any abnormal bleeding.  You may be given antibiotic medicine to help prevent infection.  You may be asked to take a pregnancy test. This is usually in the form of a urine test.  You may have a pelvic exam.  You will be asked to empty your bladder. What happens during the procedure?  You will lie down on the exam table with your feet in stirrups or with your knees bent and your feet flat on the  table.  A slender, handheld device (transducer) will be lubricated and placed into your vagina.  The transducer will be positioned to send sound waves to your uterus. The sound waves are sent to a computer and are turned into images, which your health care provider sees during the procedure.  The transducer will be removed from your vagina.  An instrument will be inserted to widen the opening of your vagina (speculum).  A swab with germ-killing solution (antiseptic) will be used to clean the opening to your uterus (cervix).  A long, thin tube (catheter) will be placed through your cervix into your uterus.  The speculum will be removed.  The transducer will be placed back into your vagina to take more images.  Your uterus will be filled with a germ-free, salt-water solution (sterile saline) through the catheter. You may feel some cramping.  A fluid that contains air bubbles may be sent through the catheter to make it easier to see the fallopian tubes.  The transducer and catheter will be removed. The procedure may vary among health care providers and hospitals. What happens after the procedure?  It is up to you to get the results of your procedure. Ask your health care provider, or the department that is doing the procedure, when your results will be ready. Summary  A sonohysterogram is a procedure that creates images of the inside of the uterus.  The risks of this procedure are very low. Most women experience cramping and spotting after the procedure.  You may need to have a   pelvic exam and take a pregnancy test before this procedure. This procedure will not be done if you are pregnant or have an infection. This information is not intended to replace advice given to you by your health care provider. Make sure you discuss any questions you have with your health care provider. Document Released: 10/28/2013 Document Revised: 05/09/2016 Document Reviewed: 05/09/2016 Elsevier  Interactive Patient Education  2017 [employer] Inc.  

## 2016-12-12 NOTE — Progress Notes (Signed)
[name], female   DOB: 1971-02-10, 46 y.o.   MRN: [id number] Cc: heavy period  HPI Mackenzie Patterson is a 46 y.o. female.  Patient's last menstrual period was 11/25/2016. [id number] Patient has heavy menses and US showed abnormal endometrium possible mass HPI  Past Medical History:  Diagnosis Date  . Asthma   . Congenital ventricular septal defect   . Diabetes mellitus without complication ([place])   . Heart murmur   . Obesity   . Pulmonary hypertension (HCC)    mild   . Tricuspid regurgitation   . VSD (ventricular septal defect), perimembranous    Qp:Qs 1.7:1 by catheterization     Past Surgical History:  Procedure Laterality Date  . CARDIAC CATHETERIZATION [name] 10/30/2014   Procedure: Right Heart Cath;  Surgeon: [name], MD;  Location: [facility] INVASIVE CV LAB CUPID;  Service: Cardiovascular;  Laterality: [name];  . CHOLECYSTECTOMY    . CYST EXCISION    . LEFT AND RIGHT HEART CATHETERIZATION WITH CORONARY ANGIOGRAM [name] 09/11/2013   Procedure: LEFT AND RIGHT HEART CATHETERIZATION WITH CORONARY ANGIOGRAM;  Surgeon: [name], MD;  Location: [facility] CATH LAB;  Service: Cardiovascular;  Laterality: [name];  . RIGHT HEART CATH [name] 09/02/2016   Procedure: Right Heart Cath;  Surgeon: [name], MD;  Location: [place] CV LAB;  Service: Cardiovascular;  Laterality: [name];  . TUBAL LIGATION      Family History  Problem Relation Age of Onset  . Cancer Other   . Hyperlipidemia Other   . Hypertension Other   . Asthma Other   . Diabetes Mother   . Diabetes Father     Social History Social History  Substance Use Topics  . Smoking status: Never Smoker  . Smokeless tobacco: Never Used  . Alcohol use No    Allergies  Allergen Reactions  . Penicillins Anaphylaxis    Has patient had a PCN reaction causing immediate rash, facial/tongue/throat swelling, SOB or lightheadedness with hypotension: Yes Has patient had a PCN reaction causing severe rash involving mucus membranes or  skin necrosis: Yes Has patient had a PCN reaction that required hospitalization Yes- went to ED, was not admitted Has patient had a PCN reaction occurring within the last 10 years: No- longer then 10 years If all of the above answers are "NO", then may proceed with Cephalosporin use.   . Tape Rash    Other reaction(s): Other (See Comments) Burns skin  . Other Rash    Other reaction(s): Other (See Comments) **EKG GEL PADS**  "Burns my skin" Pt states she is allergic to a blood pressure medication, unsure.  **EKG GEL PADS**  "Burns my skin"  . Latex Rash    Current Outpatient Prescriptions  Medication Sig Dispense Refill  . Alogliptin-Metformin HCl (KAZANO) 12.10-998 MG TABS Take 1 tablet by mouth daily. 90 tablet 3  . amLODipine (NORVASC) 5 MG tablet Take 5 mg by mouth daily.   4  . aspirin 81 MG chewable tablet Chew by mouth daily.    Marland Kitchen aspirin-acetaminophen-caffeine (EXCEDRIN MIGRAINE) 250-250-65 MG tablet Take 1 tablet by mouth every 6 (six) hours as needed (for pain/headaches.).    Marland Kitchen Diphenhydramine-APAP, sleep, (EXCEDRIN PM) 38-500 MG TABS Take 1-2 tablets by mouth at bedtime as needed (for pain/sleep.).    Marland Kitchen naproxen (NAPROSYN) 500 MG tablet Take 1 tablet (500 mg total) by mouth 2 (two) times daily with a meal. 30 tablet 0  . sitaGLIPtin-metformin (JANUMET) 50-1000 MG tablet Take 1 tablet by  mouth 2 (two) times daily with a meal. 60 tablet 11   No current facility-administered medications for this visit.     Review of Systems Review of Systems  Genitourinary: Positive for menstrual problem. Negative for vaginal bleeding and vaginal discharge.    Blood pressure 128/72, pulse 81, height 4\' 11"  (1.499 m), weight 167 lb 6.4 oz (75.9 kg), last menstrual period 11/25/2016.  Physical Exam Physical Exam  Constitutional: She is oriented to person, place, and time. She appears well-developed. She appears distressed.  Pulmonary/Chest: Effort normal.  Genitourinary: Vagina normal.  No vaginal discharge found.  Neurological: She is alert and oriented to person, place, and time.  Psychiatric: She has a normal mood and affect. Her behavior is normal.    Data Reviewed CLINICAL DATA:  Irregular menses.  LMP 10/25/2016.  EXAM: TRANSABDOMINAL AND TRANSVAGINAL ULTRASOUND OF PELVIS  TECHNIQUE: Both transabdominal and transvaginal ultrasound examinations of the pelvis were performed. Transabdominal technique was performed for global imaging of the pelvis including uterus, ovaries, adnexal regions, and pelvic cul-de-sac. It was necessary to proceed with endovaginal exam following the transabdominal exam to visualize the uterus, endometrium, ovaries, adnexal regions.  COMPARISON:  None  FINDINGS: Uterus  Measurements: 10.2 x 5.6 x 5.9 cm. No fibroids or other mass visualized.  Endometrium  Thickness: 13.9. Cystic areas are identified within the endometrium.  Right ovary  Measurements: The ovary is not visualized, either absent or obscured . No adnexal mass.  Left ovary  Measurements: The ovary is not visualized, either absent or obscured . No adnexal mass.  Other findings  No abnormal free fluid.  IMPRESSION: 1. Cystic changes within the endometrium. Consider follow-up sonohysterogram and/or hysteroscopy to evaluate for possible endometrial hyperplasia and/or polyps. 2. Nonvisualized ovaries.  No adnexal mass.   Electronically Signed   By: Nolon Nations M.D.   On: 11/17/2016 16:36   Assessment    Patient Active Problem List   Diagnosis Date Noted  . Menorrhagia 12/12/2016  . Ventricular septal defect (VSD), membranous 09/02/2016  . Pulmonary hypertension, primary ([place]) 08/23/2016  . Chest tightness 08/03/2016  . Cough 08/03/2016  . Dyspnea and respiratory abnormality 04/16/2014  . Tricuspid regurgitation   . Asthma   . VSD (ventricular septal defect), perimembranous 09/03/2013  . Pulmonary hypertension ([place])  09/03/2013  . Essential hypertension 08/06/2013  . Diabetes mellitus ([place]) 08/06/2013  . Obesity 08/06/2013  . Hypertension 07/27/2012  . Awareness of heartbeats 07/27/2012  . 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 Perimembranous ventricular septal defect 07/27/2012     Sonohysterogram  Plan    SHG ordered and f/u after study done       Emeterio Reeve 12/12/2016, 2:45 PM

## 2016-12-19 ENCOUNTER — Encounter (INDEPENDENT_AMBULATORY_CARE_PROVIDER_SITE_OTHER): Payer: Self-pay | Admitting: Physician Assistant

## 2016-12-19 ENCOUNTER — Ambulatory Visit (INDEPENDENT_AMBULATORY_CARE_PROVIDER_SITE_OTHER): Payer: Medicaid Other | Admitting: Physician Assistant

## 2016-12-19 ENCOUNTER — Other Ambulatory Visit (INDEPENDENT_AMBULATORY_CARE_PROVIDER_SITE_OTHER): Payer: Self-pay | Admitting: Physician Assistant

## 2016-12-19 VITALS — BP 130/80 | HR 75 | Temp 98.1°F | Wt 167.6 lb

## 2016-12-19 DIAGNOSIS — R5383 Other fatigue: Secondary | ICD-10-CM | POA: Diagnosis not present

## 2016-12-19 DIAGNOSIS — N6452 Nipple discharge: Secondary | ICD-10-CM

## 2016-12-19 DIAGNOSIS — E119 Type 2 diabetes mellitus without complications: Secondary | ICD-10-CM

## 2016-12-19 LAB — GLUCOSE, POCT (MANUAL RESULT ENTRY): POC Glucose: 97 mg/dl (ref 70–99)

## 2016-12-19 MED ORDER — ACCU-CHEK AVIVA PLUS W/DEVICE KIT: 1.0000 | PACK | Freq: Every day | 0 refills | Status: DC

## 2016-12-19 MED ORDER — ALOGLIPTIN-METFORMIN HCL 12.5-1000 MG PO TABS: 1.0000 | ORAL_TABLET | Freq: Two times a day (BID) | ORAL | 11 refills | Status: DC

## 2016-12-19 NOTE — Progress Notes (Signed)
Subjective:  [name], female    DOB: Dec 08, 1970  Age: 46 y.o. MRN: [id number]  CC: Janumet concern  HPI Mackenzie Patterson is a 46 y.o. female with a PMH of DM2, HTn, Asthma, VSD presents with concern of Janumet being ineffective. Taking Janumet BID as directed. Reports that her blood glucose level is up to 270s.  Associated fatigue, polydipsia, polyuria, visual blurring.  Says Mackenzie Patterson brought her down to 90-115. Also states that her Aviva Plus  glucometer strips and lancets were denied at [facility]. Says the pharmacy told her Aviva Plus supplies are no longer covered by Medicaid.     Outpatient Medications Prior to Visit  Medication Sig Dispense Refill  . amLODipine (NORVASC) 5 MG tablet Take 5 mg by mouth daily.   4  . aspirin 81 MG chewable tablet Chew by mouth daily.    Marland Kitchen aspirin-acetaminophen-caffeine (EXCEDRIN MIGRAINE) 250-250-65 MG tablet Take 1 tablet by mouth every 6 (six) hours as needed (for pain/headaches.).    Marland Kitchen Diphenhydramine-APAP, sleep, (EXCEDRIN PM) 38-500 MG TABS Take 1-2 tablets by mouth at bedtime as needed (for pain/sleep.).    Marland Kitchen naproxen (NAPROSYN) 500 MG tablet Take 1 tablet (500 mg total) by mouth 2 (two) times daily with a meal. 30 tablet 0  . sitaGLIPtin-metformin (JANUMET) 50-1000 MG tablet Take 1 tablet by mouth 2 (two) times daily with a meal. 60 tablet 11  . Alogliptin-Metformin HCl (KAZANO) 12.10-998 MG TABS Take 1 tablet by mouth daily. (Patient not taking: Reported on 12/12/2016) 90 tablet 3   No facility-administered medications prior to visit.      ROS Review of Systems  Constitutional: Positive for malaise/fatigue. Negative for chills and fever.  Eyes: Negative for blurred vision.  Respiratory: Negative for shortness of breath.   Cardiovascular: Negative for chest pain and palpitations.  Gastrointestinal: Negative for abdominal pain and nausea.  Genitourinary: Negative for dysuria and hematuria.  Musculoskeletal: Negative for joint pain and  myalgias.  Skin: Negative for rash.  Neurological: Negative for tingling and headaches.  Endo/Heme/Allergies: Positive for polydipsia.  Psychiatric/Behavioral: Negative for depression. The patient is not nervous/anxious.     Objective:  BP 130/80 (BP Location: Left Arm, Patient Position: Sitting, Cuff Size: Normal)   Pulse 75   Temp 98.1 F (36.7 C) (Oral)   Wt 167 lb 9.6 oz (76 kg)   LMP 11/25/2016 (Exact Date)   SpO2 98%   BMI 33.85 kg/m   BP/Weight 12/19/2016 8/67/6720 03/01/7095  Systolic BP 283 662 947  Diastolic BP 80 72 82  Wt. (Lbs) 167.6 167.4 166  BMI 33.85 33.81 33.53      Physical Exam  Constitutional: She is oriented to person, place, and time.  Well developed, overweight, NAD, reserved  HENT:  Head: Normocephalic and atraumatic.  Eyes: No scleral icterus.  Neck: No thyromegaly present.  Musculoskeletal: She exhibits no edema.  Neurological: She is alert and oriented to person, place, and time. No cranial nerve deficit. Coordination normal.  Skin: Skin is warm and dry. No rash noted. No erythema. No pallor.  Psychiatric: She has a normal mood and affect. Her behavior is normal. Thought content normal.  Vitals reviewed.    Assessment & Plan:   1. Fatigue, unspecified type - Glucose (CBG) 97 in clinic today. - CBC with Differential. Anemia suspected 2/2 heavy menstrual period. She is having f/u for cysts found on [name]. An order for sonohypsterogram has been placed by GYN but patient does not know date. I advised that she  call to clarify the date of her appointment.   2. Type 2 diabetes mellitus without complication, without long-term current use of insulin (HCC) - Glucose (CBG) 97 in clinic today. - I personally called [facility] and found that Medicaid does cover Aviva Plus supplies. I have also sent a new Accucheck Aviva Plus meter to [facility] as patient questions whether her old glucometer is accurate.   Meds ordered this encounter  Medications  .  Alogliptin-Metformin HCl 12.10-998 MG TABS    Sig: Take 1 tablet by mouth 2 (two) times daily.    Dispense:  60 tablet    Refill:  11    Order Specific Question:   Supervising Provider    Answer:   Tresa Garter [[id number]]    Follow-up: Return in about 2 months (around 02/18/2017) for DM2 and A1c.   Clent Demark PA

## 2016-12-19 NOTE — Patient Instructions (Signed)

## 2016-12-20 ENCOUNTER — Other Ambulatory Visit (INDEPENDENT_AMBULATORY_CARE_PROVIDER_SITE_OTHER): Payer: Self-pay | Admitting: Physician Assistant

## 2016-12-20 DIAGNOSIS — D649 Anemia, unspecified: Secondary | ICD-10-CM

## 2016-12-20 LAB — CBC WITH DIFFERENTIAL/PLATELET
BASOS ABS: 0 10*3/uL (ref 0.0–0.2)
Basos: 0 %
EOS (ABSOLUTE): 0.1 10*3/uL (ref 0.0–0.4)
EOS: 2 %
Hematocrit: 33.7 % — ABNORMAL LOW (ref 34.0–46.6)
Hemoglobin: 10.6 g/dL — ABNORMAL LOW (ref 11.1–15.9)
IMMATURE GRANULOCYTES: 0 %
Immature Grans (Abs): 0 10*3/uL (ref 0.0–0.1)
LYMPHS ABS: 1.8 10*3/uL (ref 0.7–3.1)
Lymphs: 32 %
MCH: 24.1 pg — ABNORMAL LOW (ref 26.6–33.0)
MCHC: 31.5 g/dL (ref 31.5–35.7)
MCV: 77 fL — ABNORMAL LOW (ref 79–97)
MONOS ABS: 0.5 10*3/uL (ref 0.1–0.9)
Monocytes: 9 %
NEUTROS PCT: 57 %
Neutrophils Absolute: 3.1 10*3/uL (ref 1.4–7.0)
PLATELETS: 209 10*3/uL (ref 150–379)
RBC: 4.39 x10E6/uL (ref 3.77–5.28)
RDW: 18.3 % — AB (ref 12.3–15.4)
WBC: 5.5 10*3/uL (ref 3.4–10.8)

## 2016-12-20 MED ORDER — FERROUS SULFATE 325 (65 FE) MG PO TBEC: 325.0000 mg | DELAYED_RELEASE_TABLET | Freq: Three times a day (TID) | ORAL | 0 refills | Status: DC

## 2016-12-21 ENCOUNTER — Telehealth (INDEPENDENT_AMBULATORY_CARE_PROVIDER_SITE_OTHER): Payer: Self-pay

## 2016-12-21 NOTE — Telephone Encounter (Signed)
Patient made aware that she is anemic and iron pills were sent to her pharmacy. She expressed understanding. Nat Christen, CMA

## 2016-12-27 ENCOUNTER — Other Ambulatory Visit: Payer: Medicaid Other

## 2017-01-10 ENCOUNTER — Ambulatory Visit
Admission: RE | Admit: 2017-01-10 | Discharge: 2017-01-10 | Disposition: A | Discharge status: Home / Self Care | Payer: Medicaid Other | Source: Ambulatory Visit | Attending: Physician Assistant | Admitting: Physician Assistant

## 2017-01-10 DIAGNOSIS — N6452 Nipple discharge: Secondary | ICD-10-CM

## 2017-01-10 HISTORY — DX: Nipple discharge: N64.52

## 2017-01-11 ENCOUNTER — Ambulatory Visit (INDEPENDENT_AMBULATORY_CARE_PROVIDER_SITE_OTHER): Payer: Medicaid Other | Admitting: Physician Assistant

## 2017-01-11 ENCOUNTER — Encounter (INDEPENDENT_AMBULATORY_CARE_PROVIDER_SITE_OTHER): Payer: Self-pay | Admitting: Physician Assistant

## 2017-01-11 VITALS — BP 115/76 | HR 85 | Temp 98.1°F | Wt 164.0 lb

## 2017-01-11 DIAGNOSIS — M7662 Achilles tendinitis, left leg: Secondary | ICD-10-CM | POA: Diagnosis not present

## 2017-01-11 MED ORDER — ACETAMINOPHEN-CODEINE #3 300-30 MG PO TABS: 1.0000 | ORAL_TABLET | Freq: Three times a day (TID) | ORAL | 0 refills | Status: DC | PRN

## 2017-01-11 MED ORDER — MELOXICAM 15 MG PO TABS: 15.0000 mg | ORAL_TABLET | Freq: Every day | ORAL | 0 refills | Status: DC

## 2017-01-11 NOTE — Patient Instructions (Signed)
Achilles Tendinitis  Achilles tendinitis is inflammation of the tough, cord-like band that attaches the lower leg muscles to the heel bone (Achilles tendon). This is usually caused by overusing the tendon and the ankle joint.  Achilles tendinitis usually gets better over time with treatment and caring for yourself at home. It can take weeks or months to heal completely.  What are the causes?  This condition may be caused by:  · A sudden increase in exercise or activity, such as running.  · Doing the same exercises or activities (such as jumping) over and over.  · Not warming up calf muscles before exercising.  · Exercising in shoes that are worn out or not made for exercise.  · Having arthritis or a bone growth (spur) on the back of the heel bone. This can rub against the tendon and hurt it.  · Age-related wear and tear. Tendons become less flexible with age and more likely to be injured.    What are the signs or symptoms?  Common symptoms of this condition include:  · Pain in the Achilles tendon or in the back of the leg, just above the heel. The pain usually gets worse with exercise.  · Stiffness or soreness in the back of the leg, especially in the morning.  · Swelling of the skin over the Achilles tendon.  · Thickening of the tendon.  · Bone spurs at the bottom of the Achilles tendon, near the heel.  · Trouble standing on tiptoe.    How is this diagnosed?  This condition is diagnosed based on your symptoms and a physical exam. You may have tests, including:  · X-rays.  · MRI.    How is this treated?  The goal of treatment is to relieve symptoms and help your injury heal. Treatment may include:  · Decreasing or stopping activities that caused the tendinitis. This may mean switching to low-impact exercises like biking or swimming.  · Icing the injured area.  · Doing physical therapy, including strengthening and stretching exercises.  · NSAIDs to help relieve pain and swelling.  · Using supportive shoes, wraps,  heel lifts, or a walking boot (air cast).  · Surgery. This may be done if your symptoms do not improve after 6 months.  · Using high-energy shock wave impulses to stimulate the healing process (extracorporeal shock wave therapy). This is rare.  · Injection of medicines to help relieve inflammation (corticosteroids). This is rare.    Follow these instructions at home:  If you have an air cast:   · Wear the cast as told by your health care provider. Remove it only as told by your health care provider.  · Loosen the cast if your toes tingle, become numb, or turn cold and blue.  Activity   · Gradually return to your normal activities once your health care provider approves. Do not do activities that cause pain.  ? Consider doing low-impact exercises, like cycling or swimming.  · If you have an air cast, ask your health care provider when it is safe for you to drive.  · If physical therapy was prescribed, do exercises as told by your health care provider or physical therapist.  Managing pain, stiffness, and swelling   · Raise (elevate) your foot above the level of your heart while you are sitting or lying down.  · Move your toes often to avoid stiffness and to lessen swelling.  · If directed, put ice on the injured area:  ?   Put ice in a plastic bag.  ? Place a towel between your skin and the bag.  ? Leave the ice on for 20 minutes, 2-3 times a day  General instructions   · If directed, wrap your foot with an elastic bandage or other wrap. This can help keep your tendon from moving too much while it heals. Your health care provider will show you how to wrap your foot correctly.  · Wear supportive shoes or heel lifts only as told by your health care provider.  · Take over-the-counter and prescription medicines only as told by your health care provider.  · Keep all follow-up visits as told by your health care provider. This is important.  Contact a health care provider if:  · You have symptoms that gets worse.  · You have  pain that does not get better with medicine.  · You develop new, unexplained symptoms.  · You develop warmth and swelling in your foot.  · You have a fever.  Get help right away if:  · You have a sudden popping sound or sensation in your Achilles tendon followed by severe pain.  · You cannot move your toes or foot.  · You cannot put any weight on your foot.  Summary  · Achilles tendinitis is inflammation of the tough, cord-like band that attaches the lower leg muscles to the heel bone (Achilles tendon).  · This condition is usually caused by overusing the tendon and the ankle joint. It can also be caused by arthritis or normal aging.  · The most common symptoms of this condition include pain, swelling, or stiffness in the Achilles tendon or in the back of the leg.  · This condition is usually treated with rest, NSAIDs, and physical therapy.  This information is not intended to replace advice given to you by your health care provider. Make sure you discuss any questions you have with your health care provider.  Document Released: 03/23/2005 Document Revised: 05/02/2016 Document Reviewed: 05/02/2016  Elsevier Interactive Patient Education © 2017 Elsevier Inc.

## 2017-01-11 NOTE — Progress Notes (Signed)
Subjective:  [name], female    DOB: [birth year]  Age: 46 y.o. MRN: [id number]  CC: left ankle pain  HPI Mackenzie Patterson is a 46 y.o. female with a PMH of DM2, HTN, Asthma, VSD presents with left ankle pain. 3 month onset of aching left achilles pain at the mid and distal aspect of the achilles. Worse with the use of tennis shoes. Pain aggravated when when first getting up from a seated or supine position. Pain seems to dissipate as she walks further. Exercises with brisk walking nearly everyday.     Outpatient Medications Prior to Visit  Medication Sig Dispense Refill  . amLODipine (NORVASC) 5 MG tablet Take 5 mg by mouth daily.   4  . aspirin 81 MG chewable tablet Chew by mouth daily.    Marland Kitchen aspirin-acetaminophen-caffeine (EXCEDRIN MIGRAINE) 250-250-65 MG tablet Take 1 tablet by mouth every 6 (six) hours as needed (for pain/headaches.).    Marland Kitchen Blood Glucose Monitoring Suppl (ACCU-CHEK AVIVA PLUS) w/Device KIT 1 kit by Does not apply route daily. 1 kit 0  . Diphenhydramine-APAP, sleep, (EXCEDRIN PM) 38-500 MG TABS Take 1-2 tablets by mouth at bedtime as needed (for pain/sleep.).    Marland Kitchen ferrous sulfate 325 (65 FE) MG EC tablet Take 1 tablet (325 mg total) by mouth 3 (three) times daily with meals. 90 tablet 0  . sitaGLIPtin-metformin (JANUMET) 50-1000 MG tablet Take 1 tablet by mouth 2 (two) times daily with a meal. 60 tablet 11  . Alogliptin-Metformin HCl 12.10-998 MG TABS Take 1 tablet by mouth 2 (two) times daily. 60 tablet 11  . naproxen (NAPROSYN) 500 MG tablet Take 1 tablet (500 mg total) by mouth 2 (two) times daily with a meal. 30 tablet 0   No facility-administered medications prior to visit.      ROS Review of Systems  Constitutional: Negative for chills, fever and malaise/fatigue.  Eyes: Negative for blurred vision.  Respiratory: Negative for shortness of breath.   Cardiovascular: Negative for chest pain and palpitations.  Gastrointestinal: Negative for abdominal pain  and nausea.  Genitourinary: Negative for dysuria and hematuria.  Musculoskeletal: Negative for joint pain and myalgias.       Left achilles pain  Skin: Negative for rash.  Neurological: Negative for tingling and headaches.  Psychiatric/Behavioral: Negative for depression. The patient is not nervous/anxious.     Objective:  BP 115/76 (BP Location: Left Arm, Patient Position: Sitting, Cuff Size: Normal)   Pulse 85   Temp 98.1 F (36.7 C) (Oral)   Wt 164 lb (74.4 kg)   SpO2 98%   BMI 33.12 kg/m   BP/Weight 01/11/2017 12/19/2016 01/09/9677  Systolic BP 938 101 751  Diastolic BP 76 80 72  Wt. (Lbs) 164 167.6 167.4  BMI 33.12 33.85 33.81      Physical Exam  Constitutional: She is oriented to person, place, and time.  Well developed, overweight, NAD, polite  HENT:  Head: Normocephalic and atraumatic.  Eyes: No scleral icterus.  Cardiovascular: Normal rate, regular rhythm and normal heart sounds.   Pulmonary/Chest: Effort normal and breath sounds normal.  Musculoskeletal:  Left mid and distal achilles with TTP. No nodules, ecchymosis, edema, erythema present. Thompson's test negative.  Neurological: She is alert and oriented to person, place, and time. No cranial nerve deficit. Coordination normal.  Gait normal  Skin: Skin is warm and dry. No rash noted. No erythema. No pallor.  Psychiatric: She has a normal mood and affect. Her behavior is normal. Thought content normal.  Vitals reviewed.    Assessment & Plan:   1. Achilles tendinitis of left lower extremity - Begin acetaminophen-codeine (TYLENOL #3) 300-30 MG tablet; Take 1 tablet by mouth every 8 (eight) hours as needed for moderate pain.  Dispense: 60 tablet; Refill: 0 - Begin meloxicam (MOBIC) 15 MG tablet; Take 1 tablet (15 mg total) by mouth daily.  Dispense: 30 tablet; Refill: 0   Meds ordered this encounter  Medications  . acetaminophen-codeine (TYLENOL #3) 300-30 MG tablet    Sig: Take 1 tablet by mouth every 8  (eight) hours as needed for moderate pain.    Dispense:  60 tablet    Refill:  0    Order Specific Question:   Supervising Provider    Answer:   Tresa Garter [id number]  . meloxicam (MOBIC) 15 MG tablet    Sig: Take 1 tablet (15 mg total) by mouth daily.    Dispense:  30 tablet    Refill:  0    Order Specific Question:   Supervising Provider    Answer:   Tresa Garter [id number]    Follow-up: Return in about 8 weeks (around 03/08/2017) for a1c.   Clent Demark PA

## 2017-01-12 ENCOUNTER — Ambulatory Visit: Payer: Medicaid Other | Admitting: Obstetrics & Gynecology

## 2017-01-30 ENCOUNTER — Encounter (INDEPENDENT_AMBULATORY_CARE_PROVIDER_SITE_OTHER): Payer: Self-pay | Admitting: Physician Assistant

## 2017-01-30 ENCOUNTER — Ambulatory Visit (INDEPENDENT_AMBULATORY_CARE_PROVIDER_SITE_OTHER): Payer: Medicaid Other | Admitting: Physician Assistant

## 2017-01-30 VITALS — BP 137/80 | HR 97 | Temp 98.5°F | Wt 165.8 lb

## 2017-01-30 DIAGNOSIS — R21 Rash and other nonspecific skin eruption: Secondary | ICD-10-CM | POA: Diagnosis not present

## 2017-01-30 DIAGNOSIS — M7662 Achilles tendinitis, left leg: Secondary | ICD-10-CM | POA: Diagnosis not present

## 2017-01-30 MED ORDER — NAPROXEN 500 MG PO TABS: 500.0000 mg | ORAL_TABLET | Freq: Two times a day (BID) | ORAL | 0 refills | Status: DC

## 2017-01-30 MED ORDER — HYDROXYZINE HCL 25 MG PO TABS: 25.0000 mg | ORAL_TABLET | Freq: Two times a day (BID) | ORAL | 0 refills | Status: DC | PRN

## 2017-01-30 MED ORDER — TRIAMCINOLONE ACETONIDE 0.5 % EX CREA: 1.0000 "application " | TOPICAL_CREAM | Freq: Two times a day (BID) | CUTANEOUS | 0 refills | Status: AC

## 2017-01-30 NOTE — Patient Instructions (Signed)
Achilles Tendinitis  Achilles tendinitis is inflammation of the tough, cord-like band that attaches the lower leg muscles to the heel bone (Achilles tendon). This is usually caused by overusing the tendon and the ankle joint.  Achilles tendinitis usually gets better over time with treatment and caring for yourself at home. It can take weeks or months to heal completely.  What are the causes?  This condition may be caused by:  · A sudden increase in exercise or activity, such as running.  · Doing the same exercises or activities (such as jumping) over and over.  · Not warming up calf muscles before exercising.  · Exercising in shoes that are worn out or not made for exercise.  · Having arthritis or a bone growth (spur) on the back of the heel bone. This can rub against the tendon and hurt it.  · Age-related wear and tear. Tendons become less flexible with age and more likely to be injured.    What are the signs or symptoms?  Common symptoms of this condition include:  · Pain in the Achilles tendon or in the back of the leg, just above the heel. The pain usually gets worse with exercise.  · Stiffness or soreness in the back of the leg, especially in the morning.  · Swelling of the skin over the Achilles tendon.  · Thickening of the tendon.  · Bone spurs at the bottom of the Achilles tendon, near the heel.  · Trouble standing on tiptoe.    How is this diagnosed?  This condition is diagnosed based on your symptoms and a physical exam. You may have tests, including:  · X-rays.  · MRI.    How is this treated?  The goal of treatment is to relieve symptoms and help your injury heal. Treatment may include:  · Decreasing or stopping activities that caused the tendinitis. This may mean switching to low-impact exercises like biking or swimming.  · Icing the injured area.  · Doing physical therapy, including strengthening and stretching exercises.  · NSAIDs to help relieve pain and swelling.  · Using supportive shoes, wraps,  heel lifts, or a walking boot (air cast).  · Surgery. This may be done if your symptoms do not improve after 6 months.  · Using high-energy shock wave impulses to stimulate the healing process (extracorporeal shock wave therapy). This is rare.  · Injection of medicines to help relieve inflammation (corticosteroids). This is rare.    Follow these instructions at home:  If you have an air cast:   · Wear the cast as told by your health care provider. Remove it only as told by your health care provider.  · Loosen the cast if your toes tingle, become numb, or turn cold and blue.  Activity   · Gradually return to your normal activities once your health care provider approves. Do not do activities that cause pain.  ? Consider doing low-impact exercises, like cycling or swimming.  · If you have an air cast, ask your health care provider when it is safe for you to drive.  · If physical therapy was prescribed, do exercises as told by your health care provider or physical therapist.  Managing pain, stiffness, and swelling   · Raise (elevate) your foot above the level of your heart while you are sitting or lying down.  · Move your toes often to avoid stiffness and to lessen swelling.  · If directed, put ice on the injured area:  ?   Put ice in a plastic bag.  ? Place a towel between your skin and the bag.  ? Leave the ice on for 20 minutes, 2-3 times a day  General instructions   · If directed, wrap your foot with an elastic bandage or other wrap. This can help keep your tendon from moving too much while it heals. Your health care provider will show you how to wrap your foot correctly.  · Wear supportive shoes or heel lifts only as told by your health care provider.  · Take over-the-counter and prescription medicines only as told by your health care provider.  · Keep all follow-up visits as told by your health care provider. This is important.  Contact a health care provider if:  · You have symptoms that gets worse.  · You have  pain that does not get better with medicine.  · You develop new, unexplained symptoms.  · You develop warmth and swelling in your foot.  · You have a fever.  Get help right away if:  · You have a sudden popping sound or sensation in your Achilles tendon followed by severe pain.  · You cannot move your toes or foot.  · You cannot put any weight on your foot.  Summary  · Achilles tendinitis is inflammation of the tough, cord-like band that attaches the lower leg muscles to the heel bone (Achilles tendon).  · This condition is usually caused by overusing the tendon and the ankle joint. It can also be caused by arthritis or normal aging.  · The most common symptoms of this condition include pain, swelling, or stiffness in the Achilles tendon or in the back of the leg.  · This condition is usually treated with rest, NSAIDs, and physical therapy.  This information is not intended to replace advice given to you by your health care provider. Make sure you discuss any questions you have with your health care provider.  Document Released: 03/23/2005 Document Revised: 05/02/2016 Document Reviewed: 05/02/2016  Elsevier Interactive Patient Education © 2017 Elsevier Inc.

## 2017-01-30 NOTE — Progress Notes (Addendum)
Subjective:  [name], female    DOB: [birth year]  Age: 46 y.o. MRN: [id number]  CC: ankle pain  HPI   Mackenzie Patterson a 46 y.o.femalewith a PMH of DM2, HTN, Asthma, VSD presents with left ankle pain. 4 month onset of aching left achilles pain at the mid and distal aspect of the achilles. Worse with the use of tennis shoes. Pain aggravated when first standing from a seated or supine position. Pain seems to dissipate as she walks further. Has abstained from her regular exercise. Feels that the swelling is getting worse. 2 days ago went to a cruise and woke up with mild swelling around the left achilles tendon. Had taken her prescribed Tylenol #3 with relief of pain and mild reduction in swelling Left knee and hip without any swelling or pain.    Also complains of pruritic mid chest rash x3 days during the start of her cruise to [facility]. Used hydrocortisone and Benadryl with no relief. Rash not found elsewhere.    Outpatient Medications Prior to Visit  Medication Sig Dispense Refill  . acetaminophen-codeine (TYLENOL #3) 300-30 MG tablet Take 1 tablet by mouth every 8 (eight) hours as needed for moderate pain. 60 tablet 0  . amLODipine (NORVASC) 5 MG tablet Take 5 mg by mouth daily.   4  . aspirin 81 MG chewable tablet Chew by mouth daily.    Marland Kitchen aspirin-acetaminophen-caffeine (EXCEDRIN MIGRAINE) 250-250-65 MG tablet Take 1 tablet by mouth every 6 (six) hours as needed (for pain/headaches.).    Marland Kitchen Blood Glucose Monitoring Suppl (ACCU-CHEK AVIVA PLUS) w/Device KIT 1 kit by Does not apply route daily. 1 kit 0  . Diphenhydramine-APAP, sleep, (EXCEDRIN PM) 38-500 MG TABS Take 1-2 tablets by mouth at bedtime as needed (for pain/sleep.).    Marland Kitchen ferrous sulfate 325 (65 FE) MG EC tablet Take 1 tablet (325 mg total) by mouth 3 (three) times daily with meals. 90 tablet 0  . meloxicam (MOBIC) 15 MG tablet Take 1 tablet (15 mg total) by mouth daily. 30 tablet 0  . sitaGLIPtin-metformin (JANUMET)  50-1000 MG tablet Take 1 tablet by mouth 2 (two) times daily with a meal. 60 tablet 11   No facility-administered medications prior to visit.      ROS Review of Systems  Constitutional: Negative for chills, fever and malaise/fatigue.  Eyes: Negative for blurred vision.  Respiratory: Negative for shortness of breath.   Cardiovascular: Negative for chest pain and palpitations.  Gastrointestinal: Negative for abdominal pain and nausea.  Genitourinary: Negative for dysuria and hematuria.  Musculoskeletal: Positive for joint pain. Negative for myalgias.  Skin: Negative for rash.  Neurological: Negative for tingling and headaches.  Psychiatric/Behavioral: Negative for depression. The patient is not nervous/anxious.     Objective:  BP 137/80 (BP Location: Left Arm, Patient Position: Sitting, Cuff Size: Normal)   Pulse 97   Temp 98.5 F (36.9 C) (Oral)   Wt 165 lb 12.8 oz (75.2 kg)   LMP 01/25/2017 (Approximate)   SpO2 100%   BMI 33.49 kg/m   BP/Weight 01/30/2017 01/11/2017 2/58/5277  Systolic BP 824 235 361  Diastolic BP 80 76 80  Wt. (Lbs) 165.8 164 167.6  BMI 33.49 33.12 33.85      Physical Exam  Constitutional: She is oriented to person, place, and time.  Well developed, overweight, NAD, reserved  HENT:  Head: Normocephalic and atraumatic.  Eyes: No scleral icterus.  Cardiovascular: Normal rate, regular rhythm and normal heart sounds.   Pulmonary/Chest: Effort  normal and breath sounds normal.  Abdominal: Soft. Bowel sounds are normal. There is no tenderness.  Musculoskeletal: She exhibits no edema.  Left ankle with mild limitation on plantar and dorsiflexion 2/2 pain. Mid and distal aspect of left achilles tendon with exquisite tenderness to palpation. Left ankle not edematous, erythematous, or ecchymotic. No cyanosis or increased warmth.   Neurological: She is alert and oriented to person, place, and time. No cranial nerve deficit. Coordination normal.  Mild limp  favoring left side, more pronounced when first stand/walking from a seated position.   Skin: Skin is warm and dry. No rash noted. No erythema. No pallor.  Psychiatric: She has a normal mood and affect. Her behavior is normal. Thought content normal.  Vitals reviewed.    Assessment & Plan:   1. Achilles tendinitis of left lower extremity - Ambulatory referral to Physical Therapy - [name] Misc Soft Tissue; Future - naproxen (NAPROSYN) 500 MG tablet; Take 1 tablet (500 mg total) by mouth 2 (two) times daily with a meal.  Dispense: 30 tablet; Refill: 0  2. Rash and nonspecific skin eruption - hydrOXYzine (ATARAX/VISTARIL) 25 MG tablet; Take 1 tablet (25 mg total) by mouth 2 (two) times daily as needed.  Dispense: 30 tablet; Refill: 0 - triamcinolone cream (KENALOG) 0.5 %; Apply 1 application topically 2 (two) times daily. Do not use for more than 7 consecutive days without medical approval.  Dispense: 30 g; Refill: 0   Meds ordered this encounter  Medications  . hydrOXYzine (ATARAX/VISTARIL) 25 MG tablet    Sig: Take 1 tablet (25 mg total) by mouth 2 (two) times daily as needed.    Dispense:  30 tablet    Refill:  0    Order Specific Question:   Supervising Provider    Answer:   Tresa Garter [id number]  . triamcinolone cream (KENALOG) 0.5 %    Sig: Apply 1 application topically 2 (two) times daily. Do not use for more than 7 consecutive days without medical approval.    Dispense:  30 g    Refill:  0    Order Specific Question:   Supervising Provider    Answer:   Tresa Garter [id number]  . naproxen (NAPROSYN) 500 MG tablet    Sig: Take 1 tablet (500 mg total) by mouth 2 (two) times daily with a meal.    Dispense:  30 tablet    Refill:  0    Order Specific Question:   Supervising Provider    Answer:   Tresa Garter [id number]    Follow-up: Return in about 6 weeks (around 03/13/2017) for achilles tendonitis.   Clent Demark PA

## 2017-02-02 ENCOUNTER — Encounter (HOSPITAL_COMMUNITY): Payer: Self-pay | Admitting: Emergency Medicine

## 2017-02-02 ENCOUNTER — Emergency Department (HOSPITAL_COMMUNITY)
Admission: EM | Admit: 2017-02-02 | Discharge: 2017-02-02 | Disposition: A | Discharge status: Home / Self Care | Payer: Medicaid Other | Attending: Emergency Medicine | Admitting: Emergency Medicine

## 2017-02-02 ENCOUNTER — Emergency Department (HOSPITAL_COMMUNITY): Payer: Medicaid Other

## 2017-02-02 DIAGNOSIS — E119 Type 2 diabetes mellitus without complications: Secondary | ICD-10-CM | POA: Diagnosis not present

## 2017-02-02 DIAGNOSIS — Z7982 Long term (current) use of aspirin: Secondary | ICD-10-CM | POA: Diagnosis not present

## 2017-02-02 DIAGNOSIS — M7662 Achilles tendinitis, left leg: Secondary | ICD-10-CM | POA: Diagnosis not present

## 2017-02-02 DIAGNOSIS — Z79899 Other long term (current) drug therapy: Secondary | ICD-10-CM | POA: Diagnosis not present

## 2017-02-02 DIAGNOSIS — J45909 Unspecified asthma, uncomplicated: Secondary | ICD-10-CM | POA: Insufficient documentation

## 2017-02-02 DIAGNOSIS — I10 Essential (primary) hypertension: Secondary | ICD-10-CM | POA: Diagnosis not present

## 2017-02-02 DIAGNOSIS — M79672 Pain in left foot: Secondary | ICD-10-CM | POA: Diagnosis present

## 2017-02-02 NOTE — ED Provider Notes (Signed)
[facility] DEPT Provider Note   CSN: [id number] Arrival date & time: 02/02/17  1884     History   Chief Complaint Chief Complaint  Patient presents with  . Foot Injury    HPI Mackenzie Patterson is a 46 y.o. female with a past medical history of ventricular septal defect, DM, HTN, asthma who presents to the ED today complaining of left ankle and left pain. Patient states that for the last month she has had severe pain over her Achilles tendon mostly with palpation and with walking. She was seen at her primary care office for this and told she had tendinitis. 2 days ago she noticed that her the outside of her left foot began to hurt as well primarily with walking and with pressure. She's been taking naproxen and Tylenol 3 for the last 2 days with some relief in her symptoms but states that the pain continues. She is scheduled for a physical therapy appointment on August 15 but the pain was too severe so she came to the ER for further evaluation. She denies any calf pain or swelling, injury, bruising or deformity. No history of similar symptoms.  HPI  Past Medical History:  Diagnosis Date  . Asthma   . Breast discharge 01/10/2017  . Congenital ventricular septal defect   . Diabetes mellitus without complication ([place])   . Heart murmur   . Hypertension   . Obesity   . Pulmonary hypertension (HCC)    mild   . Tricuspid regurgitation   . VSD (ventricular septal defect), perimembranous    Qp:Qs 1.7:1 by catheterization     Patient Active Problem List   Diagnosis Date Noted  . Menorrhagia 12/12/2016  . Ventricular septal defect (VSD), membranous 09/02/2016  . Pulmonary hypertension, primary ([place]) 08/23/2016  . Chest tightness 08/03/2016  . Cough 08/03/2016  . Dyspnea and respiratory abnormality 04/16/2014  . Tricuspid regurgitation   . Asthma   . VSD (ventricular septal defect), perimembranous 09/03/2013  . Pulmonary hypertension ([place]) 09/03/2013  . Essential hypertension  08/06/2013  . Diabetes mellitus ([place]) 08/06/2013  . Obesity 08/06/2013  . Hypertension 07/27/2012  . Awareness of heartbeats 07/27/2012  . 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 Perimembranous ventricular septal defect 07/27/2012    Past Surgical History:  Procedure Laterality Date  . CARDIAC CATHETERIZATION [name] 10/30/2014   Procedure: Right Heart Cath;  Surgeon: [name], MD;  Location: [facility] INVASIVE CV LAB CUPID;  Service: Cardiovascular;  Laterality: [name];  . CHOLECYSTECTOMY    . CYST EXCISION    . LEFT AND RIGHT HEART CATHETERIZATION WITH CORONARY ANGIOGRAM [name] 09/11/2013   Procedure: LEFT AND RIGHT HEART CATHETERIZATION WITH CORONARY ANGIOGRAM;  Surgeon: [name], MD;  Location: [facility] CATH LAB;  Service: Cardiovascular;  Laterality: [name];  . RIGHT HEART CATH [name] 09/02/2016   Procedure: Right Heart Cath;  Surgeon: [name], MD;  Location: [facility] CV LAB;  Service: Cardiovascular;  Laterality: [name];  . TUBAL LIGATION      OB History    Gravida Para Term Preterm AB Living   _0 0 3   SAB TAB Ectopic Multiple Live Births   0 0 0 1 3       Home Medications    Prior to Admission medications   Medication Sig Start Date End Date Taking? Authorizing Provider  amLODipine (NORVASC) 5 MG tablet Take 5 mg by mouth daily.  08/19/14  Yes [provider]  aspirin 81 MG chewable tablet  Chew by mouth daily.   Yes [provider]  sitaGLIPtin-metformin (JANUMET) 50-1000 MG tablet Take 1 tablet by mouth 2 (two) times daily with a meal. 11/30/16  Yes Clent Demark, PA-C  acetaminophen-codeine (TYLENOL #3) 300-30 MG tablet Take 1 tablet by mouth every 8 (eight) hours as needed for moderate pain. Patient not taking: Reported on 02/02/2017 01/11/17   Clent Demark, PA-C  Blood Glucose Monitoring Suppl (ACCU-CHEK AVIVA PLUS) w/Device KIT 1 kit by Does not apply route daily. 12/19/16   Clent Demark, PA-C  ferrous sulfate 325 (65 FE) MG EC tablet  Take 1 tablet (325 mg total) by mouth 3 (three) times daily with meals. Patient not taking: Reported on 02/02/2017 12/20/16   Clent Demark, PA-C  hydrOXYzine (ATARAX/VISTARIL) 25 MG tablet Take 1 tablet (25 mg total) by mouth 2 (two) times daily as needed. 01/30/17   Clent Demark, PA-C  meloxicam (MOBIC) 15 MG tablet Take 1 tablet (15 mg total) by mouth daily. Patient not taking: Reported on 02/02/2017 01/11/17   Clent Demark, PA-C  naproxen (NAPROSYN) 500 MG tablet Take 1 tablet (500 mg total) by mouth 2 (two) times daily with a meal. 01/30/17   Clent Demark, PA-C  triamcinolone cream (KENALOG) 0.5 % Apply 1 application topically 2 (two) times daily. Do not use for more than 7 consecutive days without medical approval. 01/30/17 02/06/17  Clent Demark, PA-C    Family History Family History  Problem Relation Age of Onset  . Cancer Other   . Hyperlipidemia Other   . Hypertension Other   . Asthma Other   . Diabetes Mother   . Diabetes Father     Social History Social History  Substance Use Topics  . Smoking status: Never Smoker  . Smokeless tobacco: Never Used  . Alcohol use No     Allergies   Penicillins; Tape; Other; and Latex   Review of Systems Review of Systems  All other systems reviewed and are negative.    Physical Exam Updated Vital Signs BP 111/74 (BP Location: Left Arm)   Pulse 93   Temp 97.8 F (36.6 C) (Oral)   Resp 18   Ht _0  (1.499 m)   Wt 74.8 kg (165 lb)   LMP 01/25/2017 (Approximate)   SpO2 100%   BMI 33.33 kg/m   Physical Exam  Constitutional: She is oriented to person, place, and time. She appears well-developed and well-nourished. No distress.  HENT:  Head: Normocephalic and atraumatic.  Eyes: Conjunctivae are normal. Right eye exhibits no discharge. Left eye exhibits no discharge. No scleral icterus.  Cardiovascular: Normal rate.   Pulmonary/Chest: Effort normal.  Musculoskeletal: Normal range of motion. She exhibits  no edema or deformity.  TTP over Achilles tendon, pain in same place with flexion and extension. TTP of lateral/plantar aspect of left foot without any obvious bony deformity, swelling or bruising. No decreased range of motion.  Neurological: She is alert and oriented to person, place, and time. Coordination normal.  Skin: Skin is warm and dry. No rash noted. She is not diaphoretic. No erythema. No pallor.  Psychiatric: She has a normal mood and affect. Her behavior is normal.  Nursing note and vitals reviewed.    ED Treatments / Results  Labs (all labs ordered are listed, but only abnormal results are displayed) Labs Reviewed - No data to display  EKG  EKG Interpretation None       Radiology Dg Foot Complete Left  Result Date: 02/02/2017 CLINICAL DATA:  Acute onset of left plantar and lateral foot pain. Initial encounter. EXAM: LEFT FOOT - COMPLETE 3+ VIEW COMPARISON:  None. FINDINGS: There is no evidence of fracture or dislocation. The joint spaces are preserved. There is no evidence of talar subluxation; the subtalar joint is unremarkable in appearance. A small posterior calcaneal spur is noted. No significant soft tissue abnormalities are seen. IMPRESSION: No evidence of fracture or dislocation. Electronically Signed   By: Garald Balding M.D.   On: 02/02/2017 06:00    Procedures Procedures (including critical care time)  Medications Ordered in ED Medications - No data to display   Initial Impression / Assessment and Plan / ED Course  I have reviewed the triage vital signs and the nursing notes.  Pertinent labs & imaging results that were available during my care of the patient were reviewed by me and considered in my medical decision making (see chart for details).     46 year old female presents with one month of left Achilles tendon pain in 2 day history of left foot pain. She has tenderness to palpation over her Achilles tendon as well as with flexion and extension. I  suspect she is Achilles tendinitis. Her x-ray is negative for fracture or dislocation. She has been ambulating abnormally due to her pain in her Achilles tendon I suspect this is why the outside of her left foot is now hurting. I provided her with a brace and some crutches. She was prescribed naproxen and Tylenol 3, 2 days ago which she states she has plenty at home. I instructed her to take the NSAIDs as scheduled and follow up with her orthopedist in go to her physical therapy appointment. Return precautions outlined in patient discharge instructions.  Final Clinical Impressions(s) / ED Diagnoses   Final diagnoses:  None    New Prescriptions New Prescriptions   No medications on file     Carlos Levering, PA-C 02/02/17 [id number]    Veryl Speak, MD 02/06/17 [phone]

## 2017-02-02 NOTE — Discharge Instructions (Signed)
Keep foot and brace and avoid pressure with walking to allow this to heal. Take your naproxen as scheduled and Tylenol 3 for breakthrough pain. Follow up with her primary care doctor for symptoms do not improve. Please keep your scheduled physical therapy appointment. Return to the ER. Experience severe worsening of her symptoms, fever, chills, swelling of your extremity.

## 2017-02-02 NOTE — ED Triage Notes (Signed)
Patient is complaining of left foot pain that started two days ago. Patient states that she can not walk on it and it is very painful.

## 2017-02-08 ENCOUNTER — Ambulatory Visit: Payer: Medicaid Other | Attending: Physician Assistant | Admitting: Physical Therapy

## 2017-02-08 ENCOUNTER — Encounter: Payer: Self-pay | Admitting: Physical Therapy

## 2017-02-08 DIAGNOSIS — R262 Difficulty in walking, not elsewhere classified: Secondary | ICD-10-CM | POA: Diagnosis present

## 2017-02-08 DIAGNOSIS — M25572 Pain in left ankle and joints of left foot: Secondary | ICD-10-CM | POA: Diagnosis present

## 2017-02-08 DIAGNOSIS — M25672 Stiffness of left ankle, not elsewhere classified: Secondary | ICD-10-CM | POA: Diagnosis present

## 2017-02-08 NOTE — Therapy (Signed)
[place], [place], [address] Phone: [phone]   Fax:  [phone]  Physical Therapy Evaluation  Patient Details  Name: Mackenzie Patterson MRN: [id number] Date of Birth: Aug 11, 1970 Referring Provider: Clent Demark   Encounter Date: 02/08/2017      PT End of Session - 02/08/17 1400    Visit Number 1   Number of Visits 16   Date for PT Re-Evaluation 04/05/17   Authorization Type MCD    PT Start Time 5009   PT Stop Time 1101   PT Time Calculation (min) 46 min   Activity Tolerance Patient tolerated treatment well   Behavior During Therapy [facility] for tasks assessed/performed      Past Medical History:  Diagnosis Date  . Asthma   . Breast discharge 01/10/2017  . Congenital ventricular septal defect   . Diabetes mellitus without complication ([place])   . Heart murmur   . Hypertension   . Obesity   . Pulmonary hypertension (HCC)    mild   . Tricuspid regurgitation   . VSD (ventricular septal defect), perimembranous    Qp:Qs 1.7:1 by catheterization     Past Surgical History:  Procedure Laterality Date  . CARDIAC CATHETERIZATION [name] 10/30/2014   Procedure: Right Heart Cath;  Surgeon: [name], MD;  Location: [facility] INVASIVE CV LAB CUPID;  Service: Cardiovascular;  Laterality: [name];  . CHOLECYSTECTOMY    . CYST EXCISION    . LEFT AND RIGHT HEART CATHETERIZATION WITH CORONARY ANGIOGRAM [name] 09/11/2013   Procedure: LEFT AND RIGHT HEART CATHETERIZATION WITH CORONARY ANGIOGRAM;  Surgeon: [name], MD;  Location: [facility] CATH LAB;  Service: Cardiovascular;  Laterality: [name];  . RIGHT HEART CATH [name] 09/02/2016   Procedure: Right Heart Cath;  Surgeon: [name], MD;  Location: [facility] CV LAB;  Service: Cardiovascular;  Laterality: [name];  . TUBAL LIGATION      There were no vitals filed for this visit.       Subjective Assessment - 02/08/17 1024    Subjective Patient began having pain in her left heel  about 2 weeks ago. Since that point the pain has gone down into the bottom of her foot bnd her lateral foot. She is currently in asurigcal shoe and walking on crutches.    Limitations Standing;Walking   How long can you stand comfortably? [place] as soon as she stands    How long can you walk comfortably? Needs crutches for limited community distance    Diagnostic tests X-ray: small calcaneal bone spur    Patient Stated Goals To have less pain when she walks    Currently in Pain? Yes   Pain Score 7    Pain Location Foot   Pain Orientation Left   Pain Descriptors / Indicators Aching   Pain Type Acute pain   Pain Onset 1 to 4 weeks ago   Pain Frequency Intermittent   Aggravating Factors  standing, walking, sitting    Pain Relieving Factors nothing   Effect of Pain on Daily Activities difficulty perfroming ADL's and ambulating             [facility] PT Assessment - 02/08/17 0001      Assessment   Medical Diagnosis Left heel/ foot pain    Referring Provider Clent Demark    Onset Date/Surgical Date 01/25/17   Hand Dominance Right   Next MD Visit 02/18/2017   Prior Therapy None      Precautions   Precautions  None     Restrictions   Weight Bearing Restrictions Yes   LLE Weight Bearing Weight bearing as tolerated     Balance Screen   Has the patient fallen in the past 6 months No   Has the patient had a decrease in activity level because of a fear of falling?  No   Is the patient reluctant to leave their home because of a fear of falling?  No     Home Environment   Additional Comments Stpes going into her house.      Prior Function   Level of Independence Independent   Vocation On disability   Leisure Going to the gym      Cognition   Overall Cognitive Status Within Functional Limits for tasks assessed   Attention Focused   Focused Attention Appears intact   Memory Appears intact   Awareness Appears intact   Problem Solving Appears intact     Observation/Other  Assessments   Focus on Therapeutic Outcomes (FOTO)  Medicaid 1x visit      Sensation   Light Touch Appears Intact     Coordination   Gross Motor Movements are Fluid and Coordinated Yes   Fine Motor Movements are Fluid and Coordinated Yes     ROM / Strength   AROM / PROM / Strength AROM;PROM;Strength     AROM   AROM Assessment Site Ankle   Right/Left Ankle Right;Left   Right Ankle Dorsiflexion -15   Right Ankle Plantar Flexion 28   Right Ankle Inversion 25   Right Ankle Eversion 12     PROM   PROM Assessment Site Ankle   Right/Left Ankle Right;Left   Left Ankle Dorsiflexion -5   Left Ankle Plantar Flexion 28   Left Ankle Inversion 27   Left Ankle Eversion 14     Strength   Strength Assessment Site Ankle   Right/Left Ankle Right;Left     Ambulation/Gait   Gait Comments hesitanbt to hgeel strike; Patient is walking on her left toe. It is likley contributing to her lateral foot pain. She was advised to ambulate with her heel despite her pain. reviewed use of crutches and heeel strike.             Objective measurements completed on examination: See above findings.                  PT Education - 02/08/17 1359    Education provided Yes   Education Details reviewed HEP with progressions of exercises. She was adsvised to start with just ankle pumps and then progress into other exercises when tolerated.    Person(s) Educated Patient   Methods Explanation;Demonstration;Tactile cues;Verbal cues   Comprehension Verbalized understanding;Returned demonstration;Verbal cues required;Tactile cues required          PT Short Term Goals - 02/08/17 1516      PT SHORT TERM GOAL #1   Title Patient will be independent with basic HEP for ankle strength and mobility.    Time 4   Period Weeks   Status Achieved     PT SHORT TERM GOAL #2   Title Patient will vocalize the improtance of symptom mangement and avtivity progression.    Time 4   Period Weeks   Status  Achieved                   Plan - 02/08/17 1405    Clinical Impression Statement Patient is a 46 year old female with left heel  and foot pain. Signs and symptoms are consistent with diagnosis of left achillies tendinitis. She has limited dorsflexion and pain with all ankle movements. At this time she is hesitant to weight bear on the left lower extremity. She is using crut crutces for primary mobility. She had an incidious onset of pain. She would benefit from skilled therapy but will have only 1 visit per Mediciad restrictions. She was given a program that will work on progression of range and strength. She was given extensive eduction on symptom management including RICE for edema and pain control.    Clinical Presentation Evolving   Clinical Presentation due to: fluctuating pain levels    Clinical Decision Making Moderate   Rehab Potential Fair   Clinical Impairments Affecting Rehab Potential limited visits    PT Treatment/Interventions ADLs/Self Care Home Management;Neuromuscular re-education;Stair training;Gait training   PT Next Visit Plan 1x visit per medicaid    PT Home Exercise Plan See patient insturctions    Consulted and Agree with Plan of Care Patient      Patient will benefit from skilled therapeutic intervention in order to improve the following deficits and impairments:  Abnormal gait, Pain, Difficulty walking, Decreased strength, Decreased activity tolerance, Decreased endurance  Visit Diagnosis: Pain in left ankle and joints of left foot  Stiffness of left ankle, not elsewhere classified  Difficulty in walking, not elsewhere classified     Problem List Patient Active Problem List   Diagnosis Date Noted  . Menorrhagia 12/12/2016  . Ventricular septal defect (VSD), membranous 09/02/2016  . Pulmonary hypertension, primary ([place]) 08/23/2016  . Chest tightness 08/03/2016  . Cough 08/03/2016  . Dyspnea and respiratory abnormality 04/16/2014  . Tricuspid  regurgitation   . Asthma   . VSD (ventricular septal defect), perimembranous 09/03/2013  . Pulmonary hypertension ([place]) 09/03/2013  . Essential hypertension 08/06/2013  . Diabetes mellitus ([place]) 08/06/2013  . Obesity 08/06/2013  . Hypertension 07/27/2012  . Awareness of heartbeats 07/27/2012  . 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 Perimembranous ventricular septal defect 07/27/2012    Carney Living PT DPT  02/08/2017, 3:23 PM  [facility] [address] [place], [place], [address] Phone: [phone]   Fax:  [phone]  Name: Mackenzie Patterson MRN: [id number] Date of Birth: 16-Apr-1971

## 2017-02-17 ENCOUNTER — Encounter (INDEPENDENT_AMBULATORY_CARE_PROVIDER_SITE_OTHER): Payer: Self-pay | Admitting: Physician Assistant

## 2017-02-17 ENCOUNTER — Ambulatory Visit (INDEPENDENT_AMBULATORY_CARE_PROVIDER_SITE_OTHER): Payer: Medicaid Other | Admitting: Physician Assistant

## 2017-02-17 VITALS — BP 124/81 | HR 90 | Temp 98.2°F | Wt 165.4 lb

## 2017-02-17 DIAGNOSIS — Z309 Encounter for contraceptive management, unspecified: Secondary | ICD-10-CM

## 2017-02-17 DIAGNOSIS — E119 Type 2 diabetes mellitus without complications: Secondary | ICD-10-CM

## 2017-02-17 DIAGNOSIS — H538 Other visual disturbances: Secondary | ICD-10-CM | POA: Diagnosis not present

## 2017-02-17 DIAGNOSIS — M7662 Achilles tendinitis, left leg: Secondary | ICD-10-CM

## 2017-02-17 LAB — POCT GLYCOSYLATED HEMOGLOBIN (HGB A1C): HEMOGLOBIN A1C: 5.5

## 2017-02-17 MED ORDER — ACETAMINOPHEN-CODEINE #3 300-30 MG PO TABS: 1.0000 | ORAL_TABLET | Freq: Every day | ORAL | 0 refills | Status: DC

## 2017-02-17 MED ORDER — ACETAMINOPHEN 500 MG PO TABS: 1000.0000 mg | ORAL_TABLET | Freq: Three times a day (TID) | ORAL | 0 refills | Status: DC | PRN

## 2017-02-17 MED ORDER — MELOXICAM 15 MG PO TABS: 15.0000 mg | ORAL_TABLET | Freq: Every day | ORAL | 0 refills | Status: DC

## 2017-02-17 NOTE — Patient Instructions (Signed)

## 2017-02-17 NOTE — Progress Notes (Signed)
Subjective:  [name], female    DOB: 1971/06/13  Age: 46 y.o. MRN: [id number]  CC: No chief complaint on file.   HPI Mackenzie Patterson is a 46 y.o. female with a medical history of asthma, congenital VSD, DM2, heart murmur, HTN, obesity, pulmonary HTN, and tricuspid regurgitation presents to f/u on left ankle pain. Approximately 5 month onset of aching left achilles pain at the mid and distal aspect of the achilles. Worse with the use of tennis shoes. Pain aggravated when first standing from a seated or supine position. Pain seems to dissipate as she walks further. Was prescribed Naproxen 500 mg BID but has stopped taking medication because "it doesn't help".  Tylenol #3 had previously helped. US soft tissue was ordered at previous visit but did not get done because Medicaid denied ultrasound. Went to physical therapy and had ROM testing. No other treatment at PT. Pt reports Medicaid authorizing only one session. Pt has recently went to the emergency room for achilles pain and was diagnosed with achilles tendonitis.      Also wants to f/u on DM2. Last A1c 5.8% on 11/14/16. A1c 5.5% in clinic today. Changed diet to include much less sweets.      Outpatient Medications Prior to Visit  Medication Sig Dispense Refill  . acetaminophen-codeine (TYLENOL #3) 300-30 MG tablet Take 1 tablet by mouth every 8 (eight) hours as needed for moderate pain. 60 tablet 0  . amLODipine (NORVASC) 5 MG tablet Take 5 mg by mouth daily.   4  . aspirin 81 MG chewable tablet Chew by mouth daily.    . Blood Glucose Monitoring Suppl (ACCU-CHEK AVIVA PLUS) w/Device KIT 1 kit by Does not apply route daily. 1 kit 0  . ferrous sulfate 325 (65 FE) MG EC tablet Take 1 tablet (325 mg total) by mouth 3 (three) times daily with meals. 90 tablet 0  . hydrOXYzine (ATARAX/VISTARIL) 25 MG tablet Take 1 tablet (25 mg total) by mouth 2 (two) times daily as needed. 30 tablet 0  . meloxicam (MOBIC) 15 MG tablet Take 1 tablet (15 mg  total) by mouth daily. (Patient not taking: Reported on 02/02/2017) 30 tablet 0  . naproxen (NAPROSYN) 500 MG tablet Take 1 tablet (500 mg total) by mouth 2 (two) times daily with a meal. 30 tablet 0  . sitaGLIPtin-metformin (JANUMET) 50-1000 MG tablet Take 1 tablet by mouth 2 (two) times daily with a meal. 60 tablet 11   No facility-administered medications prior to visit.      ROS Review of Systems  Constitutional: Negative for chills, fever and malaise/fatigue.  Eyes: Positive for blurred vision.  Respiratory: Negative for shortness of breath.   Cardiovascular: Negative for chest pain and palpitations.  Gastrointestinal: Negative for abdominal pain and nausea.  Genitourinary: Negative for dysuria and hematuria.  Musculoskeletal: Negative for joint pain and myalgias.  Skin: Negative for rash.  Neurological: Negative for tingling and headaches.  Psychiatric/Behavioral: Negative for depression. The patient is not nervous/anxious.     Objective:  LMP 01/25/2017 (Approximate)   BP/Weight 02/02/2017 01/30/2017 4/35/6861  Systolic BP 683 729 021  Diastolic BP 79 80 76  Wt. (Lbs) 165 165.8 164  BMI 33.33 33.49 33.12      Physical Exam  Constitutional: She is oriented to person, place, and time.  Well developed, overweight, NAD, reserved  HENT:  Head: Normocephalic and atraumatic.  Eyes: No scleral icterus.  Neck: Normal range of motion. Neck supple. No thyromegaly present.  Cardiovascular: Normal rate, regular rhythm and normal heart sounds.   Pulmonary/Chest: Effort normal and breath sounds normal.  Musculoskeletal: She exhibits no edema.  Wearing ankle brace. Pain elicited with aROM of the left ankle.   Neurological: She is alert and oriented to person, place, and time. No cranial nerve deficit. Coordination normal.  Moderate limp favoring the left side  Skin: Skin is warm and dry. No rash noted. No erythema. No pallor.  Psychiatric: She has a normal mood and affect. Her  behavior is normal. Thought content normal.  Vitals reviewed.    Assessment & Plan:   1. Type 2 diabetes mellitus without complication, without long-term current use of insulin (HCC) - HgB A1c 5.5% in clinic today - Comprehensive metabolic panel  2. Achilles tendinitis of left lower extremity - AMB referral to orthopedics - meloxicam (MOBIC) 15 MG tablet; Take 1 tablet (15 mg total) by mouth daily.  Dispense: 30 tablet; Refill: 0 - acetaminophen-codeine (TYLENOL #3) 300-30 MG tablet; Take 1 tablet by mouth at bedtime.  Dispense: 30 tablet; Refill: 0 - acetaminophen (TYLENOL) 500 MG tablet; Take 2 tablets (1,000 mg total) by mouth every 8 (eight) hours as needed.  Dispense: 90 tablet; Refill: 0  3. Blurring of visual image - Ambulatory referral to Ophthalmology  4. Encounter for contraceptive management, unspecified type - Ambulatory referral to Gynecology   Meds ordered this encounter  Medications  . meloxicam (MOBIC) 15 MG tablet    Sig: Take 1 tablet (15 mg total) by mouth daily.    Dispense:  30 tablet    Refill:  0    Order Specific Question:   Supervising Provider    Answer:   Tresa Garter [id number]  . acetaminophen-codeine (TYLENOL #3) 300-30 MG tablet    Sig: Take 1 tablet by mouth at bedtime.    Dispense:  30 tablet    Refill:  0    Order Specific Question:   Supervising Provider    Answer:   Tresa Garter [id number]  . acetaminophen (TYLENOL) 500 MG tablet    Sig: Take 2 tablets (1,000 mg total) by mouth every 8 (eight) hours as needed.    Dispense:  90 tablet    Refill:  0    Order Specific Question:   Supervising Provider    Answer:   Tresa Garter [[id number]]    Follow-up: Return in about 6 months (around 08/20/2017).   Clent Demark PA

## 2017-02-21 ENCOUNTER — Other Ambulatory Visit (INDEPENDENT_AMBULATORY_CARE_PROVIDER_SITE_OTHER): Payer: Self-pay | Admitting: Physician Assistant

## 2017-02-22 LAB — COMPREHENSIVE METABOLIC PANEL
ALK PHOS: 61 U/L (ref 33–115)
ALT: 13 U/L (ref 6–29)
AST: 19 U/L (ref 10–35)
Albumin: 4 g/dL (ref 3.6–5.1)
BILIRUBIN TOTAL: 0.4 mg/dL (ref 0.2–1.2)
BUN: 6 mg/dL — AB (ref 7–25)
CO2: 19 mmol/L — ABNORMAL LOW (ref 20–32)
CREATININE: 0.73 mg/dL (ref 0.50–1.10)
Calcium: 9 mg/dL (ref 8.6–10.2)
Chloride: 105 mmol/L (ref 98–110)
GLUCOSE: 103 mg/dL — AB (ref 65–99)
Potassium: 4.2 mmol/L (ref 3.5–5.3)
Sodium: 138 mmol/L (ref 135–146)
Total Protein: 6.9 g/dL (ref 6.1–8.1)

## 2017-02-28 ENCOUNTER — Encounter: Payer: Self-pay | Admitting: Obstetrics and Gynecology

## 2017-02-28 ENCOUNTER — Ambulatory Visit (INDEPENDENT_AMBULATORY_CARE_PROVIDER_SITE_OTHER): Payer: Medicaid Other | Admitting: Orthopaedic Surgery

## 2017-02-28 ENCOUNTER — Telehealth (INDEPENDENT_AMBULATORY_CARE_PROVIDER_SITE_OTHER): Payer: Self-pay

## 2017-02-28 ENCOUNTER — Encounter (INDEPENDENT_AMBULATORY_CARE_PROVIDER_SITE_OTHER): Payer: Self-pay | Admitting: Orthopaedic Surgery

## 2017-02-28 DIAGNOSIS — M25572 Pain in left ankle and joints of left foot: Secondary | ICD-10-CM | POA: Diagnosis not present

## 2017-02-28 NOTE — Addendum Note (Signed)
Addended by: Precious Bard on: 02/28/2017 10:38 AM   Modules accepted: Orders

## 2017-02-28 NOTE — Progress Notes (Signed)
Office Visit Note   Patient: Mackenzie Patterson           Date of Birth: [birth year]           MRN: [id number] Visit Date: 02/28/2017              Requested by: Clent Demark, PA-C [place], [place] [address] PCP: Clent Demark, PA-C   Assessment & Plan: Visit Diagnoses:  1. Pain in left ankle and joints of left foot     Plan: Patient has failed conservative treatment. MRI of the left ankle to rule out structural abnormalities. Cam walker provided today. Follow-up after the MRI.  Follow-Up Instructions: Return in about 2 weeks (around 03/14/2017).   Orders:  No orders of the defined types were placed in this encounter.  No orders of the defined types were placed in this encounter.     Procedures: No procedures performed   Clinical Data: No additional findings.   Subjective: Chief Complaint  Patient presents with  . Left Foot - Pain    Patient is a 46 year old female who has had 1 month history of left ankle and heel pain. She's had a cortisone injection without any significant relief. She has done physical therapy which showed no improvement. She is having significant pain with weightbearing. Her pain is the worst over the peroneal tendons. She has taken Tylenol 3 and naproxen without any significant relief. Denies any numbness and tingling.    Review of Systems  Constitutional: Negative.   HENT: Negative.   Eyes: Negative.   Respiratory: Negative.   Cardiovascular: Negative.   Endocrine: Negative.   Musculoskeletal: Negative.   Neurological: Negative.   Hematological: Negative.   Psychiatric/Behavioral: Negative.   All other systems reviewed and are negative.    Objective: Vital Signs: There were no vitals taken for this visit.  Physical Exam  Constitutional: She is oriented to person, place, and time. She appears well-developed and well-nourished.  HENT:  Head: Normocephalic and atraumatic.  Eyes: EOM are normal.  Neck: Neck  supple.  Pulmonary/Chest: Effort normal.  Abdominal: Soft.  Neurological: She is alert and oriented to person, place, and time.  Skin: Skin is warm. Capillary refill takes less than 2 seconds.  Psychiatric: She has a normal mood and affect. Her behavior is normal. Judgment and thought content normal.  Nursing note and vitals reviewed.   Ortho Exam Left ankle and foot exam shows tenderness over the peroneal tendons which do not sublux. Distal Achilles is tender. Posterior tibial tendon is nontender. Insertion of plantar fascia is tender. No skin changes or numbness and tingling. Specialty Comments:  No specialty comments available.  Imaging: No results found.   PMFS History: Patient Active Problem List   Diagnosis Date Noted  . Menorrhagia 12/12/2016  . Ventricular septal defect (VSD), membranous 09/02/2016  . Pulmonary hypertension, primary ([place]) 08/23/2016  . Chest tightness 08/03/2016  . Cough 08/03/2016  . Dyspnea and respiratory abnormality 04/16/2014  . Tricuspid regurgitation   . Asthma   . VSD (ventricular septal defect), perimembranous 09/03/2013  . Pulmonary hypertension ([place]) 09/03/2013  . Essential hypertension 08/06/2013  . Diabetes mellitus ([place]) 08/06/2013  . Obesity 08/06/2013  . Hypertension 07/27/2012  . Awareness of heartbeats 07/27/2012  . 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 Perimembranous ventricular septal defect 07/27/2012   Past Medical History:  Diagnosis Date  . Asthma   . Breast discharge 01/10/2017  . Congenital ventricular septal defect   . Diabetes  mellitus without complication ([place])   . Heart murmur   . Hypertension   . Obesity   . Pulmonary hypertension ([facility])    mild   . Tricuspid regurgitation   . VSD (ventricular septal defect), perimembranous    Qp:Qs 1.7:1 by catheterization     Family History  Problem Relation Age of Onset  . Cancer Other   . Hyperlipidemia Other   . Hypertension Other   . Asthma Other   .  Diabetes Mother   . Diabetes Father     Past Surgical History:  Procedure Laterality Date  . CARDIAC CATHETERIZATION [name] 10/30/2014   Procedure: Right Heart Cath;  Surgeon: [name], MD;  Location: [facility] INVASIVE CV LAB CUPID;  Service: Cardiovascular;  Laterality: [name];  . CHOLECYSTECTOMY    . CYST EXCISION    . LEFT AND RIGHT HEART CATHETERIZATION WITH CORONARY ANGIOGRAM [name] 09/11/2013   Procedure: LEFT AND RIGHT HEART CATHETERIZATION WITH CORONARY ANGIOGRAM;  Surgeon: [name], MD;  Location: [facility] CATH LAB;  Service: Cardiovascular;  Laterality: [name];  . RIGHT HEART CATH [name] 09/02/2016   Procedure: Right Heart Cath;  Surgeon: [name], MD;  Location: [facility] CV LAB;  Service: Cardiovascular;  Laterality: [name];  . TUBAL LIGATION     Social History   Occupational History  . Not on file.   Social History Main Topics  . Smoking status: Never Smoker  . Smokeless tobacco: Never Used  . Alcohol use No  . Drug use: No  . Sexual activity: Yes

## 2017-02-28 NOTE — Telephone Encounter (Signed)
Patient was referred to gynecology, She GPS the address which led her to a home in [place], after the calling gyn office was told they are located in [place]. Patient states she can not go to [place], needs gyn here in [place]. Nat Christen, CMA

## 2017-02-28 NOTE — Telephone Encounter (Signed)
Patient was referred to gynecology, She GPS the address which led her to a home in [place], after the calling gyn office was told they are located in [place]. Patient states she can not go to [place], needs gyn here in [place]. Mackenzie Patterson, CMA  Alinda Sierras, do you need a new referral order?

## 2017-03-02 NOTE — Telephone Encounter (Signed)
I refer patient to the [facility] here   Thanks

## 2017-03-15 ENCOUNTER — Other Ambulatory Visit: Payer: Medicaid Other

## 2017-03-21 ENCOUNTER — Other Ambulatory Visit: Payer: Medicaid Other

## 2017-03-22 ENCOUNTER — Encounter: Payer: Self-pay | Admitting: Obstetrics & Gynecology

## 2017-03-22 ENCOUNTER — Ambulatory Visit (INDEPENDENT_AMBULATORY_CARE_PROVIDER_SITE_OTHER): Payer: Medicaid Other | Admitting: Obstetrics & Gynecology

## 2017-03-22 VITALS — BP 132/76 | HR 92 | Ht 59.0 in | Wt 164.0 lb

## 2017-03-22 DIAGNOSIS — N920 Excessive and frequent menstruation with regular cycle: Secondary | ICD-10-CM | POA: Diagnosis not present

## 2017-03-22 DIAGNOSIS — M7662 Achilles tendinitis, left leg: Secondary | ICD-10-CM

## 2017-03-22 MED ORDER — NAPROXEN 500 MG PO TABS: 500.0000 mg | ORAL_TABLET | Freq: Two times a day (BID) | ORAL | 0 refills | Status: DC

## 2017-03-22 NOTE — Progress Notes (Signed)
Pt requests consultation for hysterectomy d/t menorrhagia and dysmenorrhea.

## 2017-03-22 NOTE — Patient Instructions (Signed)
Sonohysterogram A sonohysterogram is a procedure to examine the inside of the uterus. This exam uses sound waves that are sent to a computer to make images of the lining of the uterus (endometrium). To get the best images, a germ-free, salt-water solution (sterile saline) is put into the uterus through the vagina. You may have this procedure if you have certain reproductive problems, such as abnormal bleeding, infertility, or miscarriage. This procedure can show what may be causing these problems. Possible causes include scarring or abnormal growths such as fibroids inside your uterus. It can also show if your uterus is an abnormal shape or if the lining of the uterus is too thin. Tell a health care provider about:  All medicines you are taking, including vitamins, herbs, eye drops, creams, and over-the-counter medicines.  Any allergies you have.  Any blood disorders you have.  Any surgeries you have had.  Any medical conditions you have.  Whether you are pregnant or may be pregnant.  The date of the first day of your last period.  Any signs of infection, such as fever, pain in your lower abdomen, or abnormal discharge from your vagina. What are the risks? Generally, this is a safe procedure. However, problems may occur, including:  Abdominal pain or cramping.  Light bleeding (spotting).  Increased vaginal discharge.  Infection. What happens before the procedure?  Your health care provider may have you take an over-the-counter pain medicine.  You may be given medicine to stop any abnormal bleeding.  You may be given antibiotic medicine to help prevent infection.  You may be asked to take a pregnancy test. This is usually in the form of a urine test.  You may have a pelvic exam.  You will be asked to empty your bladder. What happens during the procedure?  You will lie down on the exam table with your feet in stirrups or with your knees bent and your feet flat on the  table.  A slender, handheld device (transducer) will be lubricated and placed into your vagina.  The transducer will be positioned to send sound waves to your uterus. The sound waves are sent to a computer and are turned into images, which your health care provider sees during the procedure.  The transducer will be removed from your vagina.  An instrument will be inserted to widen the opening of your vagina (speculum).  A swab with germ-killing solution (antiseptic) will be used to clean the opening to your uterus (cervix).  A long, thin tube (catheter) will be placed through your cervix into your uterus.  The speculum will be removed.  The transducer will be placed back into your vagina to take more images.  Your uterus will be filled with a germ-free, salt-water solution (sterile saline) through the catheter. You may feel some cramping.  A fluid that contains air bubbles may be sent through the catheter to make it easier to see the fallopian tubes.  The transducer and catheter will be removed. The procedure may vary among health care providers and hospitals. What happens after the procedure?  It is up to you to get the results of your procedure. Ask your health care provider, or the department that is doing the procedure, when your results will be ready. Summary  A sonohysterogram is a procedure that creates images of the inside of the uterus.  The risks of this procedure are very low. Most women experience cramping and spotting after the procedure.  You may need to have a   pelvic exam and take a pregnancy test before this procedure. This procedure will not be done if you are pregnant or have an infection. This information is not intended to replace advice given to you by your health care provider. Make sure you discuss any questions you have with your health care provider. Document Released: 10/28/2013 Document Revised: 05/09/2016 Document Reviewed: 05/09/2016 Elsevier  Interactive Patient Education  2017 [employer] Inc.  

## 2017-03-22 NOTE — Progress Notes (Signed)
Subjective:     [name], female   DOB: [birth year], 46 y.o.   MRN: [id number] Cc: heavy prolonged menses and pain [id number] No LMP recorded. LMP 3 weeks ago and lasted 2 weeks. She was to have a HSG but states it was not scheduled. Her office note from 11/2016 was reviewed and the [name] 10/2016.  Past Medical History:  Diagnosis Date  . Asthma   . Breast discharge 01/10/2017  . Congenital ventricular septal defect   . Diabetes mellitus without complication ([place])   . Heart murmur   . Hypertension   . Obesity   . Pulmonary hypertension (HCC)    mild   . Tricuspid regurgitation   . VSD (ventricular septal defect), perimembranous    Qp:Qs 1.7:1 by catheterization    Current Outpatient Prescriptions on File Prior to Visit  Medication Sig Dispense Refill  . amLODipine (NORVASC) 5 MG tablet Take 5 mg by mouth daily.   4  . aspirin 81 MG chewable tablet Chew by mouth daily.    . sitaGLIPtin-metformin (JANUMET) 50-1000 MG tablet Take 1 tablet by mouth 2 (two) times daily with a meal. 60 tablet 11  . acetaminophen (TYLENOL) 500 MG tablet Take 2 tablets (1,000 mg total) by mouth every 8 (eight) hours as needed. (Patient not taking: Reported on 03/22/2017) 90 tablet 0  . acetaminophen-codeine (TYLENOL #3) 300-30 MG tablet Take 1 tablet by mouth at bedtime. (Patient not taking: Reported on 03/22/2017) 30 tablet 0  . Blood Glucose Monitoring Suppl (ACCU-CHEK AVIVA PLUS) w/Device KIT 1 kit by Does not apply route daily. (Patient not taking: Reported on 03/22/2017) 1 kit 0  . ferrous sulfate 325 (65 FE) MG EC tablet Take 1 tablet (325 mg total) by mouth 3 (three) times daily with meals. (Patient not taking: Reported on 03/22/2017) 90 tablet 0  . hydrOXYzine (ATARAX/VISTARIL) 25 MG tablet Take 1 tablet (25 mg total) by mouth 2 (two) times daily as needed. (Patient not taking: Reported on 03/22/2017) 30 tablet 0  . meloxicam (MOBIC) 15 MG tablet Take 1 tablet (15 mg total) by mouth daily. (Patient  not taking: Reported on 03/22/2017) 30 tablet 0  . [DISCONTINUED] furosemide (LASIX) 20 MG tablet Take 1 tablet (20 mg total) by mouth daily. (Patient not taking: Reported on 09/14/2014) 90 tablet 6   No current facility-administered medications on file prior to visit.    Allergies  Allergen Reactions  . Penicillins Anaphylaxis    Has patient had a PCN reaction causing immediate rash, facial/tongue/throat swelling, SOB or lightheadedness with hypotension: Yes Has patient had a PCN reaction causing severe rash involving mucus membranes or skin necrosis: Yes Has patient had a PCN reaction that required hospitalization Yes- went to ED, was not admitted Has patient had a PCN reaction occurring within the last 10 years: No- longer then 10 years If all of the above answers are "NO", then may proceed with Cephalosporin use.   . Tape Rash    Other reaction(s): Other (See Comments) Burns skin  . Other Rash    Other reaction(s): Other (See Comments) **EKG GEL PADS**  "Burns my skin" Pt states she is allergic to a blood pressure medication, unsure.  **EKG GEL PADS**  "Burns my skin"  . Latex Rash    Review of Systems  Constitutional: Negative.   Respiratory: Negative.   Genitourinary: Positive for menstrual problem and pelvic pain (cramps). Negative for vaginal bleeding and vaginal discharge.       Objective:  Physical Exam  Constitutional: She is oriented to person, place, and time. She appears well-developed. She appears distressed.  Neurological: She is alert and oriented to person, place, and time.  Skin: Skin is warm and dry.  Psychiatric: She has a normal mood and affect. Her behavior is normal.  Vitals reviewed.      Assessment:     Menorrhagia and dysmenorrhea with possible endometrial mass    Plan:     Reorder [facility] and RTC for f/u  Woodroe Mode, MD 03/22/2017

## 2017-03-23 ENCOUNTER — Other Ambulatory Visit: Payer: Self-pay | Admitting: Obstetrics & Gynecology

## 2017-04-24 ENCOUNTER — Ambulatory Visit (INDEPENDENT_AMBULATORY_CARE_PROVIDER_SITE_OTHER): Payer: Medicaid Other | Admitting: Physician Assistant

## 2017-05-24 ENCOUNTER — Ambulatory Visit (INDEPENDENT_AMBULATORY_CARE_PROVIDER_SITE_OTHER): Payer: Medicaid Other | Admitting: Physician Assistant

## 2017-05-25 ENCOUNTER — Encounter (INDEPENDENT_AMBULATORY_CARE_PROVIDER_SITE_OTHER): Payer: Self-pay | Admitting: Physician Assistant

## 2017-05-25 ENCOUNTER — Other Ambulatory Visit: Payer: Self-pay

## 2017-05-25 ENCOUNTER — Ambulatory Visit (INDEPENDENT_AMBULATORY_CARE_PROVIDER_SITE_OTHER): Payer: Medicaid Other | Admitting: Physician Assistant

## 2017-05-25 VITALS — BP 130/81 | HR 89 | Temp 98.5°F | Wt 164.6 lb

## 2017-05-25 DIAGNOSIS — J069 Acute upper respiratory infection, unspecified: Secondary | ICD-10-CM | POA: Diagnosis not present

## 2017-05-25 MED ORDER — AZITHROMYCIN 250 MG PO TABS: ORAL_TABLET | ORAL | 0 refills | Status: DC

## 2017-05-25 MED ORDER — PHENYLEPHRINE-DM-GG-APAP 5-10-200-325 MG/10ML PO LIQD: 20.0000 mL | ORAL | 0 refills | Status: AC

## 2017-05-25 NOTE — Progress Notes (Signed)
Subjective:  [name], female    DOB: 02-20-1971  Age: 46 y.o. MRN: [id number]  CC: epistaxis, headache, URI  HPI  Mackenzie Patterson is a 46 y.o. female with a medical history of asthma, congenital VSD, DM2, heart murmur, HTN, obesity, pulmonary HTN, and tricuspid regurgitation presents with epistaxis and cold. Symptoms of malaise, chills, and nosebleed began two weeks ago.  Epistaxis seen only when blowing her nose and is described small amount tinging the mucus. Has some "soreness" at the lower bridge area of the nose. Has left over Tussionex for cough suppression that was previously prescribed two years ago which has helped with her symptoms. Has not taken anything else for relief. Headaches is occipital and began last Monday. Associated with visual blurring but unsure if blurring is from diabetes. Says blood sugar has been elevated recently. Does not endorse any other symptoms or complaints.      Outpatient Medications Prior to Visit  Medication Sig Dispense Refill  . acetaminophen (TYLENOL) 500 MG tablet Take 2 tablets (1,000 mg total) by mouth every 8 (eight) hours as needed. 90 tablet 0  . amLODipine (NORVASC) 5 MG tablet Take 5 mg by mouth daily.   4  . Blood Glucose Monitoring Suppl (ACCU-CHEK AVIVA PLUS) w/Device KIT 1 kit by Does not apply route daily. 1 kit 0  . sitaGLIPtin-metformin (JANUMET) 50-1000 MG tablet Take 1 tablet by mouth 2 (two) times daily with a meal. 60 tablet 11  . acetaminophen-codeine (TYLENOL #3) 300-30 MG tablet Take 1 tablet by mouth at bedtime. (Patient not taking: Reported on 03/22/2017) 30 tablet 0  . aspirin 81 MG chewable tablet Chew by mouth daily.    . ferrous sulfate 325 (65 FE) MG EC tablet Take 1 tablet (325 mg total) by mouth 3 (three) times daily with meals. (Patient not taking: Reported on 05/25/2017) 90 tablet 0  . hydrOXYzine (ATARAX/VISTARIL) 25 MG tablet Take 1 tablet (25 mg total) by mouth 2 (two) times daily as needed. (Patient not  taking: Reported on 03/22/2017) 30 tablet 0  . meloxicam (MOBIC) 15 MG tablet Take 1 tablet (15 mg total) by mouth daily. (Patient not taking: Reported on 03/22/2017) 30 tablet 0  . naproxen (NAPROSYN) 500 MG tablet Take 1 tablet (500 mg total) by mouth 2 (two) times daily with a meal. For menstrual cramps (Patient not taking: Reported on 05/25/2017) 30 tablet 0   No facility-administered medications prior to visit.      ROS Review of Systems  Constitutional: Positive for malaise/fatigue. Negative for chills and fever.  HENT: Positive for nosebleeds. Negative for ear pain, sinus pain and sore throat.   Eyes: Negative for blurred vision and pain.  Respiratory: Positive for cough (occasional). Negative for shortness of breath.   Cardiovascular: Negative for chest pain and palpitations.  Gastrointestinal: Negative for abdominal pain and nausea.  Genitourinary: Negative for dysuria and hematuria.  Musculoskeletal: Negative for joint pain and myalgias.  Skin: Negative for rash.  Neurological: Negative for tingling and headaches.  Psychiatric/Behavioral: Negative for depression. The patient is not nervous/anxious.     Objective:  BP 130/81 (BP Location: Left Arm, Patient Position: Sitting, Cuff Size: Normal)   Pulse 89   Temp 98.5 F (36.9 C) (Oral)   Wt 164 lb 9.6 oz (74.7 kg)   LMP 05/24/2017 (Exact Date)   SpO2 99%   BMI 33.25 kg/m   BP/Weight 05/25/2017 03/22/2017 5/00/3704  Systolic BP 888 916 945  Diastolic BP 81 76 81  Wt. (Lbs) 164.6 164 165.4  BMI 33.25 33.12 33.41      Physical Exam  Constitutional: She is oriented to person, place, and time.  Well developed, well nourished, NAD, polite  HENT:  Head: Normocephalic and atraumatic.  No active bleeding seen in the nose or pharyngeal wall. Turbinates moderately hypertrophic.   Eyes: No scleral icterus.  Neck: Normal range of motion. Neck supple. No thyromegaly present.  Cardiovascular: Normal rate, regular rhythm and  normal heart sounds.  Pulmonary/Chest: Effort normal and breath sounds normal. No respiratory distress. She has no wheezes.  Musculoskeletal: She exhibits no edema.  Lymphadenopathy:    She has no cervical adenopathy.  Neurological: She is alert and oriented to person, place, and time. No cranial nerve deficit. Coordination normal.  Skin: Skin is warm and dry. No rash noted. No erythema. No pallor.  No erythema or edema of the nose  Psychiatric: She has a normal mood and affect. Her behavior is normal. Thought content normal.  Vitals reviewed.    Assessment & Plan:    1. Acute upper respiratory infection - Begin azithromycin (ZITHROMAX) 250 MG tablet; Take two tablets on day one then one tablet daily thereafter.  Dispense: 6 tablet; Refill: 0. For "soreness" at the lower bridge of nose. - Begin Phenylephrine-DM-GG-APAP (MUCINEX FAST-MAX COLD FLU) 5-10-200-325 MG/10ML LIQD; Take 20 mLs by mouth every 4 (four) hours for 5 days.  Dispense: 1 Bottle; Refill: 0   Meds ordered this encounter  Medications  . azithromycin (ZITHROMAX) 250 MG tablet    Sig: Take two tablets on day one then one tablet daily thereafter.    Dispense:  6 tablet    Refill:  0    Order Specific Question:   Supervising Provider    Answer:   Tresa Garter [id number]  . Phenylephrine-DM-GG-APAP (MUCINEX FAST-MAX COLD FLU) 5-10-200-325 MG/10ML LIQD    Sig: Take 20 mLs by mouth every 4 (four) hours for 5 days.    Dispense:  1 Bottle    Refill:  0    Order Specific Question:   Supervising Provider    Answer:   Tresa Garter [id number]    Follow-up: Return if symptoms worsen or fail to improve.   Clent Demark PA

## 2017-05-25 NOTE — Patient Instructions (Signed)
Upper Respiratory Infection, Adult Most upper respiratory infections (URIs) are caused by a virus. A URI affects the nose, throat, and upper air passages. The most common type of URI is often called "the common cold." Follow these instructions at home:  Take medicines only as told by your doctor.  Gargle warm saltwater or take cough drops to comfort your throat as told by your doctor.  Use a warm mist humidifier or inhale steam from a shower to increase air moisture. This may make it easier to breathe.  Drink enough fluid to keep your pee (urine) clear or pale yellow.  Eat soups and other clear broths.  Have a healthy diet.  Rest as needed.  Go back to work when your fever is gone or your doctor says it is okay. ? You may need to stay home longer to avoid giving your URI to others. ? You can also wear a face mask and wash your hands often to prevent spread of the virus.  Use your inhaler more if you have asthma.  Do not use any tobacco products, including cigarettes, chewing tobacco, or electronic cigarettes. If you need help quitting, ask your doctor. Contact a doctor if:  You are getting worse, not better.  Your symptoms are not helped by medicine.  You have chills.  You are getting more short of breath.  You have brown or red mucus.  You have yellow or brown discharge from your nose.  You have pain in your face, especially when you bend forward.  You have a fever.  You have puffy (swollen) neck glands.  You have pain while swallowing.  You have white areas in the back of your throat. Get help right away if:  You have very bad or constant: ? Headache. ? Ear pain. ? Pain in your forehead, behind your eyes, and over your cheekbones (sinus pain). ? Chest pain.  You have long-lasting (chronic) lung disease and any of the following: ? Wheezing. ? Long-lasting cough. ? Coughing up blood. ? A change in your usual mucus.  You have a stiff neck.  You have  changes in your: ? Vision. ? Hearing. ? Thinking. ? Mood. This information is not intended to replace advice given to you by your health care provider. Make sure you discuss any questions you have with your health care provider. Document Released: 11/30/2007 Document Revised: 02/14/2016 Document Reviewed: 09/18/2013 Elsevier Interactive Patient Education  2018 Elsevier Inc.  

## 2017-05-29 ENCOUNTER — Telehealth (INDEPENDENT_AMBULATORY_CARE_PROVIDER_SITE_OTHER): Payer: Self-pay | Admitting: Physician Assistant

## 2017-05-29 NOTE — Telephone Encounter (Signed)
Patient called because she saw today that her Blood pressure medicine Amlidipine 5mg  is a recall she took her these morning . Please, call her thank you

## 2017-05-29 NOTE — Telephone Encounter (Signed)
Called and asked patient for LOT number for her amlodipine, patient was not at home, she will call when she gets home to provide LOT number in order to determine if the amlodipine she takes is on the recall list. Nat Christen, CMA

## 2017-05-31 ENCOUNTER — Telehealth (INDEPENDENT_AMBULATORY_CARE_PROVIDER_SITE_OTHER): Payer: Self-pay | Admitting: Physician Assistant

## 2017-05-31 NOTE — Telephone Encounter (Signed)
Patient returning your call.

## 2017-07-18 ENCOUNTER — Ambulatory Visit (INDEPENDENT_AMBULATORY_CARE_PROVIDER_SITE_OTHER): Payer: Medicaid Other | Admitting: Physician Assistant

## 2017-07-18 ENCOUNTER — Telehealth (INDEPENDENT_AMBULATORY_CARE_PROVIDER_SITE_OTHER): Payer: Self-pay | Admitting: Physician Assistant

## 2017-07-18 ENCOUNTER — Other Ambulatory Visit: Payer: Self-pay

## 2017-07-18 ENCOUNTER — Telehealth (INDEPENDENT_AMBULATORY_CARE_PROVIDER_SITE_OTHER): Payer: Self-pay

## 2017-07-18 ENCOUNTER — Encounter (INDEPENDENT_AMBULATORY_CARE_PROVIDER_SITE_OTHER): Payer: Self-pay | Admitting: Physician Assistant

## 2017-07-18 VITALS — BP 148/89 | HR 89 | Temp 97.9°F | Wt 169.0 lb

## 2017-07-18 DIAGNOSIS — R002 Palpitations: Secondary | ICD-10-CM | POA: Diagnosis not present

## 2017-07-18 DIAGNOSIS — I10 Essential (primary) hypertension: Secondary | ICD-10-CM | POA: Diagnosis not present

## 2017-07-18 DIAGNOSIS — R5383 Other fatigue: Secondary | ICD-10-CM

## 2017-07-18 DIAGNOSIS — E119 Type 2 diabetes mellitus without complications: Secondary | ICD-10-CM | POA: Diagnosis not present

## 2017-07-18 LAB — POCT GLYCOSYLATED HEMOGLOBIN (HGB A1C): HEMOGLOBIN A1C: 6

## 2017-07-18 MED ORDER — VITAMIN D-3 125 MCG (5000 UT) PO TABS: 1.0000 | ORAL_TABLET | Freq: Every day | ORAL | 0 refills | Status: DC

## 2017-07-18 MED ORDER — CARVEDILOL 12.5 MG PO TABS: 12.5000 mg | ORAL_TABLET | Freq: Two times a day (BID) | ORAL | 1 refills | Status: DC

## 2017-07-18 NOTE — Telephone Encounter (Signed)
Pt called to request to speak with the nurse about her medication, she did not want to said which one, please follow up

## 2017-07-18 NOTE — Patient Instructions (Signed)
Palpitations A palpitation is the feeling that your heart:  Has an uneven (irregular) heartbeat.  Is beating faster than normal.  Is fluttering.  Is skipping a beat.  This is usually not a serious problem. In some cases, you may need more medical tests. Follow these instructions at home:  Avoid: ? Caffeine in coffee, tea, soft drinks, diet pills, and energy drinks. ? Chocolate. ? Alcohol.  Do not use any tobacco products. These include cigarettes, chewing tobacco, and e-cigarettes. If you need help quitting, ask your doctor.  Try to reduce your stress. These things may help: ? Yoga. ? Meditation. ? Physical activity. Swimming, jogging, and walking are good choices. ? A method that helps you use your mind to control things in your body, like heartbeats (biofeedback).  Get plenty of rest and sleep.  Take over-the-counter and prescription medicines only as told by your doctor.  Keep all follow-up visits as told by your doctor. This is important. Contact a doctor if:  Your heartbeat is still fast or uneven after 24 hours.  Your palpitations occur more often. Get help right away if:  You have chest pain.  You feel short of breath.  You have a very bad headache.  You feel dizzy.  You pass out (faint). This information is not intended to replace advice given to you by your health care provider. Make sure you discuss any questions you have with your health care provider. Document Released: 03/22/2008 Document Revised: 11/19/2015 Document Reviewed: 02/26/2015 Elsevier Interactive Patient Education  2018 Elsevier Inc.  

## 2017-07-18 NOTE — Progress Notes (Signed)
Subjective:  [name], female    DOB: April 19, 1971  Age: 47 y.o. MRN: [id number]  CC:   HPI  Mackenzie Patterson is a 47 y.o. female with a medical history of asthma, congenital VSD, DM2, heart murmur, HTN, obesity, pulmonary HTN, and tricuspid regurgitation presents for f/u of DM2. Taking Janumet as directed. Does not specifically diet or exercise but would like a referral to diabetes and nutrition counseling. Last A1c 5.5% on 02/17/17. A1c 6.0% in clinic today. Does not endorse any symptoms or complaints aside from palpitations. She is currently managed by [facility] for HTN and perimembranous ventricular septal defect. Says palpitations are frequent. Next visit to cardiology reportedly next month.    Outpatient Medications Prior to Visit  Medication Sig Dispense Refill  . acetaminophen (TYLENOL) 500 MG tablet Take 2 tablets (1,000 mg total) by mouth every 8 (eight) hours as needed. 90 tablet 0  . aspirin 81 MG chewable tablet Chew by mouth daily.    . Blood Glucose Monitoring Suppl (ACCU-CHEK AVIVA PLUS) w/Device KIT 1 kit by Does not apply route daily. 1 kit 0  . sitaGLIPtin-metformin (JANUMET) 50-1000 MG tablet Take 1 tablet by mouth 2 (two) times daily with a meal. 60 tablet 11  . amLODipine (NORVASC) 5 MG tablet Take 5 mg by mouth daily.   4  . azithromycin (ZITHROMAX) 250 MG tablet Take two tablets on day one then one tablet daily thereafter. 6 tablet 0   No facility-administered medications prior to visit.      ROS Review of Systems  Constitutional: Negative for chills, fever and malaise/fatigue.  Eyes: Negative for blurred vision.  Respiratory: Negative for shortness of breath.   Cardiovascular: Positive for palpitations. Negative for chest pain.  Gastrointestinal: Negative for abdominal pain and nausea.  Genitourinary: Negative for dysuria and hematuria.  Musculoskeletal: Negative for joint pain and myalgias.  Skin: Negative for rash.  Neurological: Negative  for tingling and headaches.  Psychiatric/Behavioral: Negative for depression. The patient is not nervous/anxious.     Objective:  BP (!) 148/89 (BP Location: Right Arm, Patient Position: Sitting, Cuff Size: Normal)   Pulse 89   Temp 97.9 F (36.6 C) (Oral)   Wt 169 lb (76.7 kg)   LMP 07/12/2017 (Exact Date)   SpO2 93%   BMI 34.13 kg/m   BP/Weight 07/18/2017 05/25/2017 7/89/3810  Systolic BP 175 102 585  Diastolic BP 89 81 76  Wt. (Lbs) 169 164.6 164  BMI 34.13 33.25 33.12      Physical Exam  Constitutional: She is oriented to person, place, and time.  Well developed, well nourished, NAD, polite  HENT:  Head: Normocephalic and atraumatic.  Eyes: No scleral icterus.  Neck: Normal range of motion. Neck supple. No thyromegaly present.  Cardiovascular: Normal rate, regular rhythm and normal heart sounds.  Pulmonary/Chest: Effort normal and breath sounds normal.  Musculoskeletal: She exhibits no edema.  Neurological: She is alert and oriented to person, place, and time. No cranial nerve deficit. Coordination normal.  Skin: Skin is warm and dry. No rash noted. No erythema. No pallor.  Psychiatric: She has a normal mood and affect. Her behavior is normal. Thought content normal.  Vitals reviewed.    Assessment & Plan:    1. Type 2 diabetes mellitus without complication, without long-term current use of insulin (HCC) - HgB A1c 6.0% increased over 5.5% - Referral to Nutrition and Diabetes Services  2. Other fatigue - Begin Cholecalciferol (VITAMIN D-3) 5000 units TABS; Take 1  tablet by mouth daily.  Dispense: 30 tablet; Refill: 0  3. Hypertension, unspecified type - Begin carvedilol (COREG) 12.5 MG tablet; Take 1 tablet (12.5 mg total) by mouth 2 (two) times daily with a meal.  Dispense: 90 tablet; Refill: 1 - Continue on Amlodipine 37m  4. Palpitations -Begin carvedilol (COREG) 12.5 MG tablet; Take 1 tablet (12.5 mg total) by mouth 2 (two) times daily with a meal.   Dispense: 90 tablet; Refill: 1   Meds ordered this encounter  Medications  . Cholecalciferol (VITAMIN D-3) 5000 units TABS    Sig: Take 1 tablet by mouth daily.    Dispense:  30 tablet    Refill:  0    Order Specific Question:   Supervising Provider    Answer:   JTresa Garter[[id number] . carvedilol (COREG) 12.5 MG tablet    Sig: Take 1 tablet (12.5 mg total) by mouth 2 (two) times daily with a meal.    Dispense:  90 tablet    Refill:  1    Order Specific Question:   Supervising Provider    Answer:   JTresa Garter[[id number]   Follow-up: Return in about 4 weeks (around 08/15/2017) for palpitations.   RClent DemarkPA

## 2017-07-18 NOTE — Telephone Encounter (Signed)
Patient needs referral to diabetic nutrition classes. Nat Christen, CMA

## 2017-07-19 NOTE — Telephone Encounter (Signed)
Done in clinic today.

## 2017-07-27 ENCOUNTER — Ambulatory Visit: Payer: Medicaid Other

## 2017-08-03 ENCOUNTER — Ambulatory Visit: Payer: Medicaid Other

## 2017-08-10 ENCOUNTER — Ambulatory Visit: Payer: Medicaid Other

## 2017-08-17 ENCOUNTER — Encounter: Payer: Self-pay | Admitting: Dietician

## 2017-08-17 ENCOUNTER — Ambulatory Visit (INDEPENDENT_AMBULATORY_CARE_PROVIDER_SITE_OTHER): Payer: Medicaid Other | Admitting: Physician Assistant

## 2017-08-17 ENCOUNTER — Encounter: Payer: Medicaid Other | Attending: Physician Assistant | Admitting: Dietician

## 2017-08-17 ENCOUNTER — Telehealth (INDEPENDENT_AMBULATORY_CARE_PROVIDER_SITE_OTHER): Payer: Self-pay | Admitting: Physician Assistant

## 2017-08-17 DIAGNOSIS — E119 Type 2 diabetes mellitus without complications: Secondary | ICD-10-CM | POA: Insufficient documentation

## 2017-08-17 DIAGNOSIS — Z713 Dietary counseling and surveillance: Secondary | ICD-10-CM | POA: Diagnosis not present

## 2017-08-17 DIAGNOSIS — I10 Essential (primary) hypertension: Secondary | ICD-10-CM

## 2017-08-17 NOTE — Telephone Encounter (Signed)
Pt called to speak with the nurse, she did not want to say for what , she just said is Personal, please follow up

## 2017-08-17 NOTE — Progress Notes (Signed)
Diabetes Self-Management Education  Visit Type: First/Initial  Appt. Start Time: 1530 Appt. End Time: 1700  08/17/2017  [name], identified by name and date of birth, is a 47 y.o. female with a diagnosis of Diabetes: Type 2.  She is also concerned about HTN and weight management.  She has good nutrition questions for me today.  ASSESSMENT   Diabetes Self-Management Education - 08/17/17 1712      Visit Information   Visit Type  First/Initial      Initial Visit   Diabetes Type  Type 2    Are you currently following a meal plan?  No    Are you taking your medications as prescribed?  No sometimes forgets, also concerned about side effects    Date Diagnosed  2016      Health Coping   How would you rate your overall health?  Poor      Psychosocial Assessment   Patient Belief/Attitude about Diabetes  Afraid    Other persons present  Family Member daughter    Patient Concerns  Nutrition/Meal planning;Glycemic Control;Weight Control    Special Needs  None    Learning Readiness  Ready    How often do you need to have someone help you when you read instructions, pamphlets, or other written materials from your doctor or pharmacy?  1 - Never    What is the last grade level you completed in school?  35TD      Complications   Last HgB A1C per patient/outside source  6 %    How often do you check your blood sugar?  1-2 times/day    Fasting Blood glucose range (mg/dL)  70-129    Number of hypoglycemic episodes per month  0    Number of hyperglycemic episodes per week  0    Have you had a dilated eye exam in the past 12 months?  No    Have you had a dental exam in the past 12 months?  No    Are you checking your feet?  Yes    How many days per week are you checking your feet?  4      Dietary Intake   Breakfast  2 pieces [place] bacon, shredded potatoes with bell pepper, peach tea Snapple    Lunch  trail mix with peanuts, sunflower seeds, dried cranberries and raisins Or fruit  like pear Or 3x/week fast-food like McDonalds, Taco Bell, or Popeyes    Snack (afternoon)  canned peaches    Dinner  crab cake, fish, coleslaw OR thin crust pizza with pepperoni and jalepenos OR skips    Beverage(s)  punch, sweet tea, ICE flavored water, 3 bottles water per day (48 oz)      Exercise   Exercise Type  Light (walking / raking leaves)    How many days per week to you exercise?  7    How many minutes per day do you exercise?  30    Total minutes per week of exercise  210      Patient Education   Previous Diabetes Education  No    Nutrition management   Carbohydrate counting;Food label reading, portion sizes and measuring food.;Meal options for control of blood glucose level and chronic complications.    Medications  Other (comment) Addressed her beliefs about and challenges with taking medications      Individualized Goals (developed by patient)   Nutrition  Follow meal plan discussed;General guidelines for healthy choices and portions discussed  Outcomes   Expected Outcomes  Demonstrated interest in learning. Expect positive outcomes    Future DMSE  4-6 wks    Program Status  Not Completed       Individualized Plan for Diabetes Self-Management Training:   Learning Objective:  Patient will have a greater understanding of diabetes self-management. Patient education plan is to attend individual and/or group sessions per assessed needs and concerns.   Plan:   Follow Diabetes Meal Plan as instructed  Eat within 1-2 hours of waking and then every 4-5 hrs throughout the day  Limit carbohydrate intake to 30-45 grams carbohydrate/meal  Limit carbohydrate intake to 15-20 grams carbohydrate/snack   Limit sodium intake to 2,300 mg per day  Read Nutrition Labels as discussed  Add lean protein foods to meals/snacks  Monitor glucose levels as instructed by your doctor   Expected Outcomes:  Demonstrated interest in learning. Expect positive outcomes  Education  material provided: Food label handouts, Meal plan card, My Plate and Snack sheet, Quick & Easy Meal Ideas  If problems or questions, patient to contact team via:  Phone and Email  Future DSME appointment: 4-6 wks

## 2017-08-18 ENCOUNTER — Ambulatory Visit (INDEPENDENT_AMBULATORY_CARE_PROVIDER_SITE_OTHER): Payer: Medicaid Other | Admitting: Physician Assistant

## 2017-08-24 ENCOUNTER — Ambulatory Visit: Payer: Medicaid Other

## 2017-08-29 NOTE — Telephone Encounter (Signed)
Patient verified DOB Patient states she reported to the ED for the "personal" concern. (though there is no record). Patient advised to follow up with PCP just to ensure all concerns have been met. MA contacted front office to schedule the patient and provide patient with time and date. No further questions.

## 2017-08-31 ENCOUNTER — Ambulatory Visit: Payer: Medicaid Other

## 2017-09-14 ENCOUNTER — Ambulatory Visit: Payer: Medicaid Other | Admitting: Dietician

## 2017-09-21 ENCOUNTER — Other Ambulatory Visit: Payer: Self-pay

## 2017-09-21 ENCOUNTER — Ambulatory Visit (INDEPENDENT_AMBULATORY_CARE_PROVIDER_SITE_OTHER): Payer: Self-pay | Admitting: Physician Assistant

## 2017-09-21 ENCOUNTER — Encounter (INDEPENDENT_AMBULATORY_CARE_PROVIDER_SITE_OTHER): Payer: Self-pay | Admitting: Physician Assistant

## 2017-09-21 VITALS — BP 114/75 | HR 79 | Temp 97.9°F | Ht 59.0 in | Wt 166.4 lb

## 2017-09-21 DIAGNOSIS — N921 Excessive and frequent menstruation with irregular cycle: Secondary | ICD-10-CM

## 2017-09-21 DIAGNOSIS — R002 Palpitations: Secondary | ICD-10-CM

## 2017-09-21 DIAGNOSIS — F5089 Other specified eating disorder: Secondary | ICD-10-CM

## 2017-09-21 DIAGNOSIS — R9389 Abnormal findings on diagnostic imaging of other specified body structures: Secondary | ICD-10-CM

## 2017-09-21 DIAGNOSIS — H538 Other visual disturbances: Secondary | ICD-10-CM

## 2017-09-21 DIAGNOSIS — G4700 Insomnia, unspecified: Secondary | ICD-10-CM

## 2017-09-21 DIAGNOSIS — R454 Irritability and anger: Secondary | ICD-10-CM

## 2017-09-21 MED ORDER — CLONAZEPAM 0.5 MG PO TABS: 0.5000 mg | ORAL_TABLET | Freq: Every day | ORAL | 0 refills | Status: DC

## 2017-09-21 MED ORDER — ESCITALOPRAM OXALATE 10 MG PO TABS: 10.0000 mg | ORAL_TABLET | Freq: Every day | ORAL | 2 refills | Status: DC

## 2017-09-21 MED ORDER — NAPROXEN 500 MG PO TABS: 500.0000 mg | ORAL_TABLET | Freq: Two times a day (BID) | ORAL | 0 refills | Status: DC

## 2017-09-21 NOTE — Patient Instructions (Signed)
Living With Depression Everyone experiences occasional disappointment, sadness, and loss in their lives. When you are feeling down, blue, or sad for at least 2 weeks in a row, it may mean that you have depression. Depression can affect your thoughts and feelings, relationships, daily activities, and physical health. It is caused by changes in the way your brain functions. If you receive a diagnosis of depression, your health care provider will tell you which type of depression you have and what treatment options are available to you. If you are living with depression, there are ways to help you recover from it and also ways to prevent it from coming back. How to cope with lifestyle changes Coping with stress Stress is your body's reaction to life changes and events, both good and bad. Stressful situations may include:  Getting married.  The death of a spouse.  Losing a job.  Retiring.  Having a baby.  Stress can last just a few hours or it can be ongoing. Stress can play a major role in depression, so it is important to learn both how to cope with stress and how to think about it differently. Talk with your health care provider or a counselor if you would like to learn more about stress reduction. He or she may suggest some stress reduction techniques, such as:  Music therapy. This can include creating music or listening to music. Choose music that you enjoy and that inspires you.  Mindfulness-based meditation. This kind of meditation can be done while sitting or walking. It involves being aware of your normal breaths, rather than trying to control your breathing.  Centering prayer. This is a kind of meditation that involves focusing on a spiritual word or phrase. Choose a word, phrase, or sacred image that is meaningful to you and that brings you peace.  Deep breathing. To do this, expand your stomach and inhale slowly through your nose. Hold your breath for 3-5 seconds, then exhale  slowly, allowing your stomach muscles to relax.  Muscle relaxation. This involves intentionally tensing muscles then relaxing them.  Choose a stress reduction technique that fits your lifestyle and personality. Stress reduction techniques take time and practice to develop. Set aside 5-15 minutes a day to do them. Therapists can offer training in these techniques. The training may be covered by some insurance plans. Other things you can do to manage stress include:  Keeping a stress diary. This can help you learn what triggers your stress and ways to control your response.  Understanding what your limits are and saying no to requests or events that lead to a schedule that is too full.  Thinking about how you respond to certain situations. You may not be able to control everything, but you can control how you react.  Adding humor to your life by watching funny films or TV shows.  Making time for activities that help you relax and not feeling guilty about spending your time this way.  Medicines Your health care provider may suggest certain medicines if he or she feels that they will help improve your condition. Avoid using alcohol and other substances that may prevent your medicines from working properly (may interact). It is also important to:  Talk with your pharmacist or health care provider about all the medicines that you take, their possible side effects, and what medicines are safe to take together.  Make it your goal to take part in all treatment decisions (shared decision-making). This includes giving input on the side   effects of medicines. It is best if shared decision-making with your health care provider is part of your total treatment plan.  If your health care provider prescribes a medicine, you may not notice the full benefits of it for 4-8 weeks. Most people who are treated for depression need to be on medicine for at least 6-12 months after they feel better. If you are taking  medicines as part of your treatment, do not stop taking medicines without first talking to your health care provider. You may need to have the medicine slowly decreased (tapered) over time to decrease the risk of harmful side effects. Relationships Your health care provider may suggest family therapy along with individual therapy and drug therapy. While there may not be family problems that are causing you to feel depressed, it is still important to make sure your family learns as much as they can about your mental health. Having your family's support can help make your treatment successful. How to recognize changes in your condition Everyone has a different response to treatment for depression. Recovery from major depression happens when you have not had signs of major depression for two months. This may mean that you will start to:  Have more interest in doing activities.  Feel less hopeless than you did 2 months ago.  Have more energy.  Overeat less often, or have better or improving appetite.  Have better concentration.  Your health care provider will work with you to decide the next steps in your recovery. It is also important to recognize when your condition is getting worse. Watch for these signs:  Having fatigue or low energy.  Eating too much or too little.  Sleeping too much or too little.  Feeling restless, agitated, or hopeless.  Having trouble concentrating or making decisions.  Having unexplained physical complaints.  Feeling irritable, angry, or aggressive.  Get help as soon as you or your family members notice these symptoms coming back. How to get support and help from others How to talk with friends and family members about your condition Talking to friends and family members about your condition can provide you with one way to get support and guidance. Reach out to trusted friends or family members, explain your symptoms to them, and let them know that you are  working with a health care provider to treat your depression. Financial resources Not all insurance plans cover mental health care, so it is important to check with your insurance carrier. If paying for co-pays or counseling services is a problem, search for a local or county mental health care center. They may be able to offer public mental health care services at low or no cost when you are not able to see a private health care provider. If you are taking medicine for depression, you may be able to get the generic form, which may be less expensive. Some makers of prescription medicines also offer help to patients who cannot afford the medicines they need. Follow these instructions at home:  Get the right amount and quality of sleep.  Cut down on using caffeine, tobacco, alcohol, and other potentially harmful substances.  Try to exercise, such as walking or lifting small weights.  Take over-the-counter and prescription medicines only as told by your health care provider.  Eat a healthy diet that includes plenty of vegetables, fruits, whole grains, low-fat dairy products, and lean protein. Do not eat a lot of foods that are high in solid fats, added sugars, or salt.    Keep all follow-up visits as told by your health care provider. This is important. Contact a health care provider if:  You stop taking your antidepressant medicines, and you have any of these symptoms: ? Nausea. ? Headache. ? Feeling lightheaded. ? Chills and body aches. ? Not being able to sleep (insomnia).  You or your friends and family think your depression is getting worse. Get help right away if:  You have thoughts of hurting yourself or others. If you ever feel like you may hurt yourself or others, or have thoughts about taking your own life, get help right away. You can go to your nearest emergency department or call:  Your local emergency services (911 in the [place]  A suicide crisis helpline, such as the  National Suicide Prevention Lifeline at 1-800-273-8255. This is open 24-hours a day.  Summary  If you are living with depression, there are ways to help you recover from it and also ways to prevent it from coming back.  Work with your health care team to create a management plan that includes counseling, stress management techniques, and healthy lifestyle habits. This information is not intended to replace advice given to you by your health care provider. Make sure you discuss any questions you have with your health care provider. Document Released: 05/16/2016 Document Revised: 05/16/2016 Document Reviewed: 05/16/2016 Elsevier Interactive Patient Education  2018 [employer]  

## 2017-09-21 NOTE — Progress Notes (Signed)
Subjective:  [name], female    DOB: 22-Oct-1970  Age: 47 y.o. MRN: [id number]  CC: f/u DM  HPI Mackenzie Patterson a 47 y.o.femalewith a medical history of asthma, congenital VSD, DM2, heart murmur, HTN, obesity, pulmonary HTN, and tricuspid regurgitation presents for f/u of DM2. Last A1c 6.0% on 07/18/17. Went to Diabetic Nutrition class last month.    Outpatient Medications Prior to Visit  Medication Sig Dispense Refill  . amLODipine (NORVASC) 5 MG tablet Take 5 mg by mouth daily.   4  . aspirin 81 MG chewable tablet Chew by mouth daily.    . Blood Glucose Monitoring Suppl (ACCU-CHEK AVIVA PLUS) w/Device KIT 1 kit by Does not apply route daily. 1 kit 0  . carvedilol (COREG) 12.5 MG tablet Take 1 tablet (12.5 mg total) by mouth 2 (two) times daily with a meal. 90 tablet 1  . sitaGLIPtin-metformin (JANUMET) 50-1000 MG tablet Take 1 tablet by mouth 2 (two) times daily with a meal. 60 tablet 11  . acetaminophen (TYLENOL) 500 MG tablet Take 2 tablets (1,000 mg total) by mouth every 8 (eight) hours as needed. (Patient not taking: Reported on 09/21/2017) 90 tablet 0  . Cholecalciferol (VITAMIN D-3) 5000 units TABS Take 1 tablet by mouth daily. (Patient not taking: Reported on 09/21/2017) 30 tablet 0   No facility-administered medications prior to visit.      ROS Review of Systems  Constitutional: Negative for chills, fever and malaise/fatigue.  Eyes: Negative for blurred vision.  Respiratory: Negative for shortness of breath.   Cardiovascular: Negative for chest pain and palpitations.  Gastrointestinal: Negative for abdominal pain and nausea.  Genitourinary: Negative for dysuria and hematuria.  Musculoskeletal: Negative for joint pain and myalgias.  Skin: Negative for rash.  Neurological: Negative for tingling and headaches.  Psychiatric/Behavioral: Negative for depression. The patient is not nervous/anxious.     Objective:  BP 114/75 (BP Location: Left Arm, Patient  Position: Sitting, Cuff Size: Normal)   Pulse 79   Temp 97.9 F (36.6 C) (Oral)   Ht 4' 11" (1.499 m)   Wt 166 lb 6.4 oz (75.5 kg)   LMP 08/29/2017 (Approximate)   SpO2 100%   BMI 33.61 kg/m   BP/Weight 09/21/2017 07/18/2017 51/07/5850  Systolic BP 778 242 353  Diastolic BP 75 89 81  Wt. (Lbs) 166.4 169 164.6  BMI 33.61 34.13 33.25      Physical Exam  Constitutional: She is oriented to person, place, and time.  Well developed, well nourished, NAD, polite  HENT:  Head: Normocephalic and atraumatic.  Eyes: No scleral icterus.  Pale conjunctiva  Neck: Normal range of motion. Neck supple. No thyromegaly present.  Cardiovascular: Normal rate, regular rhythm and normal heart sounds.  Pulmonary/Chest: Effort normal and breath sounds normal. No respiratory distress.  Musculoskeletal: She exhibits no edema.  Neurological: She is alert and oriented to person, place, and time. No cranial nerve deficit. Coordination normal.  Skin: Skin is warm and dry. No rash noted. No erythema. No pallor.  Psychiatric: Her behavior is normal. Thought content normal.  Mood baseline since I have met pt.  Vitals reviewed.    Assessment & Plan:    1. Palpitations - Continue on Carvedilol.  2. Pica - CBC with Differential  3. Menometrorrhagia - CBC with Differential - naproxen (NAPROSYN) 500 MG tablet; Take 1 tablet (500 mg total) by mouth 2 (two) times daily with a meal.  Dispense: 30 tablet; Refill: 0  4. Abnormal pelvic ultrasound -  Pt asked to speak to her GYN Dr. Roselie Awkward.  5. Irritability - escitalopram (LEXAPRO) 10 MG tablet; Take 1 tablet (10 mg total) by mouth daily.  Dispense: 30 tablet; Refill: 2  6. Insomnia, unspecified type - clonazePAM (KLONOPIN) 0.5 MG tablet; Take 1 tablet (0.5 mg total) by mouth at bedtime.  Dispense: 30 tablet; Refill: 0  7. Blurring of visual image - Ambulatory referral to Ophthalmology   Meds ordered this encounter  Medications  . naproxen  (NAPROSYN) 500 MG tablet    Sig: Take 1 tablet (500 mg total) by mouth 2 (two) times daily with a meal.    Dispense:  30 tablet    Refill:  0    Order Specific Question:   Supervising Provider    Answer:   Tresa Garter [id number]  . escitalopram (LEXAPRO) 10 MG tablet    Sig: Take 1 tablet (10 mg total) by mouth daily.    Dispense:  30 tablet    Refill:  2    Order Specific Question:   Supervising Provider    Answer:   Tresa Garter [id number]  . clonazePAM (KLONOPIN) 0.5 MG tablet    Sig: Take 1 tablet (0.5 mg total) by mouth at bedtime.    Dispense:  30 tablet    Refill:  0    Order Specific Question:   Supervising Provider    Answer:   Tresa Garter [id number]    Follow-up: Return in about 6 weeks (around 11/02/2017).   Clent Demark PA

## 2017-10-02 ENCOUNTER — Encounter (INDEPENDENT_AMBULATORY_CARE_PROVIDER_SITE_OTHER): Payer: Self-pay | Admitting: Physician Assistant

## 2017-10-16 ENCOUNTER — Other Ambulatory Visit (INDEPENDENT_AMBULATORY_CARE_PROVIDER_SITE_OTHER): Payer: Self-pay | Admitting: Physician Assistant

## 2017-10-16 ENCOUNTER — Telehealth (INDEPENDENT_AMBULATORY_CARE_PROVIDER_SITE_OTHER): Payer: Self-pay | Admitting: Physician Assistant

## 2017-10-16 MED ORDER — AMLODIPINE BESYLATE 5 MG PO TABS: 5.0000 mg | ORAL_TABLET | Freq: Every day | ORAL | 3 refills | Status: DC

## 2017-10-16 NOTE — Telephone Encounter (Signed)
Patient called requesting a refill of  amLODipine (NORVASC) 5 MG tablet   She use [facility] [address]

## 2017-10-16 NOTE — Telephone Encounter (Signed)
Sent refill

## 2017-10-16 NOTE — Telephone Encounter (Signed)
FWD to PCP. Tempestt S Roberts, CMA  

## 2017-10-23 ENCOUNTER — Observation Stay (HOSPITAL_COMMUNITY)
Admission: EM | Admit: 2017-10-23 | Discharge: 2017-10-26 | Disposition: A | Discharge status: Home / Self Care | Payer: Medicaid Other | Attending: Internal Medicine | Admitting: Internal Medicine

## 2017-10-23 ENCOUNTER — Emergency Department (HOSPITAL_COMMUNITY): Payer: Medicaid Other

## 2017-10-23 DIAGNOSIS — Q210 Ventricular septal defect: Secondary | ICD-10-CM | POA: Diagnosis not present

## 2017-10-23 DIAGNOSIS — Z7982 Long term (current) use of aspirin: Secondary | ICD-10-CM | POA: Insufficient documentation

## 2017-10-23 DIAGNOSIS — G459 Transient cerebral ischemic attack, unspecified: Principal | ICD-10-CM | POA: Insufficient documentation

## 2017-10-23 DIAGNOSIS — J45909 Unspecified asthma, uncomplicated: Secondary | ICD-10-CM | POA: Diagnosis not present

## 2017-10-23 DIAGNOSIS — I270 Primary pulmonary hypertension: Secondary | ICD-10-CM | POA: Diagnosis not present

## 2017-10-23 DIAGNOSIS — Z79899 Other long term (current) drug therapy: Secondary | ICD-10-CM | POA: Diagnosis not present

## 2017-10-23 DIAGNOSIS — I10 Essential (primary) hypertension: Secondary | ICD-10-CM | POA: Diagnosis present

## 2017-10-23 DIAGNOSIS — E119 Type 2 diabetes mellitus without complications: Secondary | ICD-10-CM | POA: Diagnosis not present

## 2017-10-23 DIAGNOSIS — R29810 Facial weakness: Secondary | ICD-10-CM | POA: Diagnosis present

## 2017-10-23 LAB — CBG MONITORING, ED: GLUCOSE-CAPILLARY: 132 mg/dL — AB (ref 65–99)

## 2017-10-23 NOTE — ED Notes (Signed)
[facility] called to activate code stroke @2350 

## 2017-10-23 NOTE — ED Provider Notes (Signed)
TIME SEEN: 11:57 PM  CHIEF COMPLAINT: Code Stroke  HPI: Patient is a 47 year old female with history of hypertension, diabetes, VSD, TIA who presents to the emergency department with left upper and lower extremity weakness that started at 11:15 PM tonight.  Denies headache or head injury.  Denies being on blood thinners.  Denies numbness or weakness.  Family also reports that they noticed left-sided facial droop and some slurred speech that have improved.  No chest pain or shortness of breath.  Family reports she had a TIA approximately 1 month ago and was seen at a different emergency department.  She was not admitted at that time.  They report she had right-sided symptoms at that time.  Significant family history of CAD and strokes.  ROS: See HPI Constitutional: no fever  Eyes: no drainage  ENT: no runny nose   Cardiovascular:  no chest pain  Resp: no SOB  GI: no vomiting GU: no dysuria Integumentary: no rash  Allergy: no hives  Musculoskeletal: no leg swelling  Neurological: no slurred speech ROS otherwise negative  PAST MEDICAL HISTORY/PAST SURGICAL HISTORY:  Past Medical History:  Diagnosis Date  . Asthma   . Breast discharge 01/10/2017  . Congenital ventricular septal defect   . Diabetes mellitus without complication ([place])   . Heart murmur   . Hypertension   . Obesity   . Pulmonary hypertension ([facility])    mild   . Tricuspid regurgitation   . VSD (ventricular septal defect), perimembranous    Qp:Qs 1.7:1 by catheterization     MEDICATIONS:  Prior to Admission medications   Medication Sig Start Date End Date Taking? Authorizing Provider  amLODipine (NORVASC) 5 MG tablet Take 1 tablet (5 mg total) by mouth daily. 10/16/17   Clent Demark, PA-C  aspirin 81 MG chewable tablet Chew by mouth daily.    [provider]  Blood Glucose Monitoring Suppl (ACCU-CHEK AVIVA PLUS) w/Device KIT 1 kit by Does not apply route daily. 12/19/16   Clent Demark, PA-C   carvedilol (COREG) 12.5 MG tablet Take 1 tablet (12.5 mg total) by mouth 2 (two) times daily with a meal. 07/18/17   Clent Demark, PA-C  clonazePAM (KLONOPIN) 0.5 MG tablet Take 1 tablet (0.5 mg total) by mouth at bedtime. 09/21/17   Clent Demark, PA-C  escitalopram (LEXAPRO) 10 MG tablet Take 1 tablet (10 mg total) by mouth daily. 09/21/17   Clent Demark, PA-C  naproxen (NAPROSYN) 500 MG tablet Take 1 tablet (500 mg total) by mouth 2 (two) times daily with a meal. 09/21/17   Clent Demark, PA-C  sitaGLIPtin-metformin (JANUMET) 50-1000 MG tablet Take 1 tablet by mouth 2 (two) times daily with a meal. 11/30/16   Clent Demark, PA-C  furosemide (LASIX) 20 MG tablet Take 1 tablet (20 mg total) by mouth daily. Patient not taking: Reported on 09/14/2014 03/25/14 09/14/14  [name], MD    ALLERGIES:  Allergies  Allergen Reactions  . Penicillins Anaphylaxis    Has patient had a PCN reaction causing immediate rash, facial/tongue/throat swelling, SOB or lightheadedness with hypotension: Yes Has patient had a PCN reaction causing severe rash involving mucus membranes or skin necrosis: Yes Has patient had a PCN reaction that required hospitalization Yes- went to ED, was not admitted Has patient had a PCN reaction occurring within the last 10 years: No- longer then 10 years If all of the above answers are "NO", then may proceed with Cephalosporin use.   Marland Kitchen  Tape Rash    Other reaction(s): Other (See Comments) Burns skin  . Other Rash    Other reaction(s): Other (See Comments) **EKG GEL PADS**  "Burns my skin" Pt states she is allergic to a blood pressure medication, unsure.  **EKG GEL PADS**  "Burns my skin"  . Latex Rash    SOCIAL HISTORY:  Social History   Tobacco Use  . Smoking status: Never Smoker  . Smokeless tobacco: Never Used  Substance Use Topics  . Alcohol use: No    FAMILY HISTORY: Family History  Problem Relation Age of Onset  . Cancer Other    . Hyperlipidemia Other   . Hypertension Other   . Asthma Other   . Diabetes Mother   . Diabetes Father     EXAM: BP (!) 154/96 (BP Location: Right Arm)   Pulse 96   Temp 99.1 F (37.3 C) (Oral)   Resp 20   SpO2 99%  CONSTITUTIONAL: Alert and oriented and responds appropriately to questions. Well-appearing; well-nourished, slow to answer questions HEAD: Normocephalic EYES: Conjunctivae clear, pupils appear equal, EOMI ENT: normal nose; moist mucous membranes NECK: Supple, no meningismus, no nuchal rigidity, no LAD  CARD: RRR; S1 and S2 appreciated; + systolic murmur, no clicks, no rubs, no gallops RESP: Normal chest excursion without splinting or tachypnea; breath sounds clear and equal bilaterally; no wheezes, no rhonchi, no rales, no hypoxia or respiratory distress, speaking full sentences ABD/GI: Normal bowel sounds; non-distended; soft, non-tender, no rebound, no guarding, no peritoneal signs, no hepatosplenomegaly BACK:  The back appears normal and is non-tender to palpation, there is no CVA tenderness EXT: Normal ROM in all joints; non-tender to palpation; no edema; normal capillary refill; no cyanosis, no calf tenderness or swelling    SKIN: Normal color for age and race; warm; no rash NEURO: Moves all extremities equally, mild drift of the left upper extremity, no significant weakness noted in any of her extremities however, normal sensation diffusely, cranial nerves II through XII intact, normal speech PSYCH: The patient's mood and manner are appropriate. Grooming and personal hygiene are appropriate.  MEDICAL DECISION MAKING: Patient here as a code stroke.  Initially had significant left upper and lower extremity drift per nursing staff but this does seem to be improving but not completely resolved.  Blood sugar normal.  Blood pressure slightly elevated.  ED PROGRESS: Head CT shows no acute abnormality.  Labs unremarkable.  Dr. Rory Percy with neurology at bedside.  He recommends  medicine admission for TIA work-up.  12:25 AM Discussed patient's case with hospitalist, Dr. Hal Hope.  I have recommended admission and patient (and family if present) agree with this plan. Admitting physician will place admission orders.   I reviewed all nursing notes, vitals, pertinent previous records, EKGs, lab and urine results, imaging (as available).     EKG Interpretation  Date/Time:  Tuesday October 24 2017 00:18:10 EDT Ventricular Rate:  99 PR Interval:    QRS Duration: 84 QT Interval:  351 QTC Calculation: 451 R Axis:   92 Text Interpretation:  Sinus rhythm Borderline right axis deviation Consider anterior infarct Baseline wander in lead(s) II III aVF V2 V3 V4 V5 V6 No significant change since last tracing Confirmed by Ward, Cyril Mourning [phone]) on 10/24/2017 12:35:05 AM         Ward, Delice Bison, DO 10/24/17 [id number]

## 2017-10-24 ENCOUNTER — Observation Stay (HOSPITAL_COMMUNITY): Payer: Medicaid Other

## 2017-10-24 ENCOUNTER — Other Ambulatory Visit: Payer: Self-pay

## 2017-10-24 ENCOUNTER — Encounter (HOSPITAL_COMMUNITY): Payer: Self-pay | Admitting: *Deleted

## 2017-10-24 DIAGNOSIS — I270 Primary pulmonary hypertension: Secondary | ICD-10-CM | POA: Diagnosis not present

## 2017-10-24 DIAGNOSIS — G459 Transient cerebral ischemic attack, unspecified: Secondary | ICD-10-CM

## 2017-10-24 DIAGNOSIS — I10 Essential (primary) hypertension: Secondary | ICD-10-CM

## 2017-10-24 DIAGNOSIS — Q210 Ventricular septal defect: Secondary | ICD-10-CM

## 2017-10-24 LAB — DIFFERENTIAL
BASOS ABS: 0 10*3/uL (ref 0.0–0.1)
Basophils Relative: 0 %
Eosinophils Absolute: 0.1 10*3/uL (ref 0.0–0.7)
Eosinophils Relative: 2 %
LYMPHS ABS: 1.8 10*3/uL (ref 0.7–4.0)
LYMPHS PCT: 27 %
Monocytes Absolute: 0.6 10*3/uL (ref 0.1–1.0)
Monocytes Relative: 9 %
NEUTROS PCT: 62 %
Neutro Abs: 4 10*3/uL (ref 1.7–7.7)

## 2017-10-24 LAB — HEMOGLOBIN A1C
HEMOGLOBIN A1C: 5.5 % (ref 4.8–5.6)
MEAN PLASMA GLUCOSE: 111.15 mg/dL

## 2017-10-24 LAB — COMPREHENSIVE METABOLIC PANEL
ALT: 23 U/L (ref 14–54)
AST: 25 U/L (ref 15–41)
Albumin: 3.9 g/dL (ref 3.5–5.0)
Alkaline Phosphatase: 54 U/L (ref 38–126)
Anion gap: 5 (ref 5–15)
BILIRUBIN TOTAL: 0.6 mg/dL (ref 0.3–1.2)
BUN: 5 mg/dL — AB (ref 6–20)
CALCIUM: 8.9 mg/dL (ref 8.9–10.3)
CO2: 25 mmol/L (ref 22–32)
CREATININE: 0.73 mg/dL (ref 0.44–1.00)
Chloride: 107 mmol/L (ref 101–111)
Glucose, Bld: 130 mg/dL — ABNORMAL HIGH (ref 65–99)
Potassium: 3.7 mmol/L (ref 3.5–5.1)
Sodium: 137 mmol/L (ref 135–145)
TOTAL PROTEIN: 7.1 g/dL (ref 6.5–8.1)

## 2017-10-24 LAB — APTT: aPTT: 32 seconds (ref 24–36)

## 2017-10-24 LAB — GLUCOSE, CAPILLARY
GLUCOSE-CAPILLARY: 93 mg/dL (ref 65–99)
Glucose-Capillary: 98 mg/dL (ref 65–99)

## 2017-10-24 LAB — CBG MONITORING, ED
GLUCOSE-CAPILLARY: 99 mg/dL (ref 65–99)
GLUCOSE-CAPILLARY: 134 mg/dL — AB (ref 65–99)

## 2017-10-24 LAB — CBC
HCT: 35.1 % — ABNORMAL LOW (ref 36.0–46.0)
HEMATOCRIT: 34.8 % — AB (ref 36.0–46.0)
HEMOGLOBIN: 10.9 g/dL — AB (ref 12.0–15.0)
HEMOGLOBIN: 11 g/dL — AB (ref 12.0–15.0)
MCH: 23.6 pg — AB (ref 26.0–34.0)
MCH: 23.6 pg — ABNORMAL LOW (ref 26.0–34.0)
MCHC: 31.3 g/dL (ref 30.0–36.0)
MCHC: 31.3 g/dL (ref 30.0–36.0)
MCV: 75.2 fL — ABNORMAL LOW (ref 78.0–100.0)
MCV: 75.5 fL — ABNORMAL LOW (ref 78.0–100.0)
Platelets: 205 10*3/uL (ref 150–400)
Platelets: 212 10*3/uL (ref 150–400)
RBC: 4.61 MIL/uL (ref 3.87–5.11)
RBC: 4.67 MIL/uL (ref 3.87–5.11)
RDW: 17.6 % — ABNORMAL HIGH (ref 11.5–15.5)
RDW: 17.7 % — ABNORMAL HIGH (ref 11.5–15.5)
WBC: 6.1 10*3/uL (ref 4.0–10.5)
WBC: 6.4 10*3/uL (ref 4.0–10.5)

## 2017-10-24 LAB — CREATININE, SERUM: Creatinine, Ser: 0.74 mg/dL (ref 0.44–1.00)

## 2017-10-24 LAB — LIPID PANEL
Cholesterol: 145 mg/dL (ref 0–200)
HDL: 45 mg/dL (ref >40)
LDL CALC: 83 mg/dL (ref 0–99)
Total CHOL/HDL Ratio: 3.2 RATIO
Triglycerides: 87 mg/dL (ref <150)
VLDL: 17 mg/dL (ref 0–40)

## 2017-10-24 LAB — I-STAT CHEM 8, ED
BUN: 4 mg/dL — ABNORMAL LOW (ref 6–20)
Calcium, Ion: 1.18 mmol/L (ref 1.15–1.40)
Chloride: 104 mmol/L (ref 101–111)
Creatinine, Ser: 0.7 mg/dL (ref 0.44–1.00)
Glucose, Bld: 126 mg/dL — ABNORMAL HIGH (ref 65–99)
HEMATOCRIT: 35 % — AB (ref 36.0–46.0)
HEMOGLOBIN: 11.9 g/dL — AB (ref 12.0–15.0)
POTASSIUM: 3.7 mmol/L (ref 3.5–5.1)
Sodium: 140 mmol/L (ref 135–145)
TCO2: 24 mmol/L (ref 22–32)

## 2017-10-24 LAB — I-STAT BETA HCG BLOOD, ED (MC, WL, AP ONLY)

## 2017-10-24 LAB — I-STAT TROPONIN, ED: Troponin i, poc: 0 ng/mL (ref 0.00–0.08)

## 2017-10-24 LAB — PROTIME-INR
INR: 0.99
Prothrombin Time: 13 seconds (ref 11.4–15.2)

## 2017-10-24 MED ORDER — ACETAMINOPHEN 650 MG RE SUPP: 650.0000 mg | RECTAL | Status: DC | PRN

## 2017-10-24 MED ORDER — ACETAMINOPHEN 160 MG/5ML PO SOLN: 650.0000 mg | ORAL | Status: DC | PRN

## 2017-10-24 MED ORDER — ACETAMINOPHEN 325 MG PO TABS: 650.0000 mg | ORAL_TABLET | ORAL | Status: DC | PRN

## 2017-10-24 MED ORDER — STROKE: EARLY STAGES OF RECOVERY BOOK
Freq: Once | Status: DC
  Filled 2017-10-24: qty 1

## 2017-10-24 MED ORDER — ATORVASTATIN CALCIUM 80 MG PO TABS
80.0000 mg | ORAL_TABLET | Freq: Every day | ORAL | Status: DC
  Administered 2017-10-24 – 2017-10-25 (×2): 80 mg via ORAL
  Filled 2017-10-24 (×3): qty 1

## 2017-10-24 MED ORDER — ENOXAPARIN SODIUM 40 MG/0.4ML ~~LOC~~ SOLN
40.0000 mg | SUBCUTANEOUS | Status: DC
  Administered 2017-10-25 – 2017-10-26 (×2): 40 mg via SUBCUTANEOUS
  Filled 2017-10-24 (×3): qty 0.4

## 2017-10-24 MED ORDER — SODIUM CHLORIDE 0.9 % IV SOLN
INTRAVENOUS | Status: DC
  Administered 2017-10-24: 11:00:00 via INTRAVENOUS

## 2017-10-24 MED ORDER — CARVEDILOL 12.5 MG PO TABS
12.5000 mg | ORAL_TABLET | Freq: Two times a day (BID) | ORAL | Status: DC
  Administered 2017-10-24 – 2017-10-26 (×5): 12.5 mg via ORAL
  Filled 2017-10-24 (×5): qty 1

## 2017-10-24 MED ORDER — AMLODIPINE BESYLATE 5 MG PO TABS
5.0000 mg | ORAL_TABLET | Freq: Every day | ORAL | Status: DC
  Administered 2017-10-24 – 2017-10-26 (×3): 5 mg via ORAL
  Filled 2017-10-24 (×3): qty 1

## 2017-10-24 MED ORDER — LORAZEPAM 1 MG PO TABS
1.0000 mg | ORAL_TABLET | Freq: Once | ORAL | Status: AC
  Administered 2017-10-24: 1 mg via ORAL
  Filled 2017-10-24: qty 1

## 2017-10-24 MED ORDER — ASPIRIN EC 325 MG PO TBEC
325.0000 mg | DELAYED_RELEASE_TABLET | Freq: Every day | ORAL | Status: DC
  Administered 2017-10-24 – 2017-10-26 (×3): 325 mg via ORAL
  Filled 2017-10-24 (×3): qty 1

## 2017-10-24 MED ORDER — INSULIN ASPART 100 UNIT/ML ~~LOC~~ SOLN
0.0000 [IU] | Freq: Three times a day (TID) | SUBCUTANEOUS | Status: DC
  Administered 2017-10-24 – 2017-10-25 (×2): 1 [IU] via SUBCUTANEOUS
  Filled 2017-10-24: qty 1

## 2017-10-24 MED ORDER — LORAZEPAM 2 MG/ML IJ SOLN
0.5000 mg | Freq: Once | INTRAMUSCULAR | Status: AC
  Administered 2017-10-24: 0.5 mg via INTRAVENOUS
  Filled 2017-10-24: qty 1

## 2017-10-24 MED ORDER — GADOBENATE DIMEGLUMINE 529 MG/ML IV SOLN
20.0000 mL | Freq: Once | INTRAVENOUS | Status: AC
  Administered 2017-10-24: 20 mL via INTRAVENOUS

## 2017-10-24 NOTE — ED Notes (Signed)
Pt did not like what was ordered for breakfast. This RN gave pt a menu to order from, new breakfast tray ordered at this time

## 2017-10-24 NOTE — ED Notes (Signed)
MD Kakrakandy at bedside. 

## 2017-10-24 NOTE — Evaluation (Signed)
Occupational Therapy Evaluation Patient Details Name: Mackenzie Patterson MRN: [id number] DOB: 05-01-1971 Today's Date: 10/24/2017    History of Present Illness Pt is a 47 y/o female admitted secondary to LUE weakness. MRI and CT negative for acute abnormality. PMH includes HTN, DM, pulmonary HTN, VSD, and asthma.    Clinical Impression   Pt is currently modified independent for simulated selfcare tasks and transfers in and out of the tub and toilet.  She moves slower than normal but exhibited no LOB with any functional tasks and only one LOB with standing on the right foot only.  She was able to complete stepping strategy as appropriate to keep from falling.  UE coordination WFLs as well as strength. No follow-up OT needs at this time.      Follow Up Recommendations  No OT follow up    Equipment Recommendations  None recommended by OT       Precautions / Restrictions Precautions Precautions: None Restrictions Weight Bearing Restrictions: No      Mobility Bed Mobility Overal bed mobility: Modified Independent       Supine to sit: Modified independent (Device/Increase time) Sit to supine: Modified independent (Device/Increase time)      Transfers Overall transfer level: Modified independent Equipment used: None Transfers: Sit to/from Stand Sit to Stand: Modified independent (Device/Increase time)              Balance Overall balance assessment: Modified Independent   Sitting balance-Leahy Scale: Normal       Standing balance-Leahy Scale: Good Standing balance comment: Pt able to maintain standing balance statically with feet together and eyes closed.  She was able to position feet in tandem and maintain for greater than 10 seconds.  LOB to the left with standing on the right foot after 5-6 seconds but exhibited balance reaction appropriately to correct.                            ADL either performed or assessed with clinical judgement   ADL Overall ADL's  : Modified independent                                       General ADL Comments: Pt moving slightly slower than normal but able to complete all simulated selfcare tasks at modified independent level.  No further needs or DME at this time.     Vision Baseline Vision/History: Wears glasses(Pt with distant visual deficits but does not have glasses at this time. ) Patient Visual Report: No change from baseline Vision Assessment?: Yes Eye Alignment: Within Functional Limits Ocular Range of Motion: Within Functional Limits Alignment/Gaze Preference: Within Defined Limits Tracking/Visual Pursuits: Able to track stimulus in all quads without difficulty Saccades: Within functional limits Convergence: Within functional limits Visual Fields: No apparent deficits            Pertinent Vitals/Pain Pain Assessment: No/denies pain     Hand Dominance Right   Extremity/Trunk Assessment Upper Extremity Assessment Upper Extremity Assessment: Overall WFL for tasks assessed   Lower Extremity Assessment Lower Extremity Assessment: Defer to PT evaluation   Cervical / Trunk Assessment Cervical / Trunk Assessment: Normal   Communication Communication Communication: No difficulties   Cognition Arousal/Alertness: Awake/alert Behavior During Therapy: Flat affect Overall Cognitive Status: Within Functional Limits for tasks assessed  Home Living Family/patient expects to be discharged to:: Private residence Living Arrangements: Spouse/significant other Available Help at Discharge: Family;Available PRN/intermittently Type of Home: House Home Access: Stairs to enter [employer] of Steps: 3 Entrance Stairs-Rails: Right;Left;Can reach both Home Layout: One level     Bathroom Shower/Tub: Walk-in shower;Tub/shower unit   Bathroom Toilet: Standard     Home Equipment: None          Prior  Functioning/Environment Level of Independence: Independent                                           AM-PAC PT "6 Clicks" Daily Activity     Outcome Measure Help from another person eating meals?: None Help from another person taking care of personal grooming?: None Help from another person toileting, which includes using toliet, bedpan, or urinal?: None Help from another person bathing (including washing, rinsing, drying)?: None Help from another person to put on and taking off regular upper body clothing?: None Help from another person to put on and taking off regular lower body clothing?: None 6 Click Score: 24   End of Session Equipment Utilized During Treatment: Gait belt Nurse Communication: Mobility status  Activity Tolerance: Patient tolerated treatment well Patient left: in bed;with call bell/phone within reach;with bed alarm set;with family/visitor present                   Time: 4401-0272 OT Time Calculation (min): 19 min Charges:  OT General Charges $OT Visit: 1 Visit OT Evaluation $OT Eval Low Complexity: 1 Low G-Codes: OT G-codes **NOT FOR INPATIENT CLASS** Functional Limitation: Self care Self Care Current Status (Z3664): At least 1 percent but less than 20 percent impaired, limited or restricted Self Care Goal Status (Q0347): At least 1 percent but less than 20 percent impaired, limited or restricted Self Care Discharge Status [phone]): At least 1 percent but less than 20 percent impaired, limited or restricted     Mackenzie Patterson OTR/L 10/24/2017, 4:13 PM

## 2017-10-24 NOTE — Evaluation (Signed)
Physical Therapy Evaluation Patient Details Name: Mackenzie Patterson MRN: [id number] DOB: 1970-08-16 Today's Date: 10/24/2017   History of Present Illness  Pt is a 47 y/o female admitted secondary to LUE weakness. MRI and CT negative for acute abnormality. PMH includes HTN, DM, pulmonary HTN, VSD, and asthma.   Clinical Impression  Pt admitted secondary to problem above with deficits below. Pt presenting with mild unsteadiness requiring min guard to occasional min A for steadying. Pt asking about use of AD at home, so educated about use of cane to increase stability. Feel pt will progress well and will not require follow up PT. Will continue to follow acutely to maximize functional mobility independence and safety.     Follow Up Recommendations No PT follow up;Supervision - Intermittent    Equipment Recommendations  Cane    Recommendations for Other Services       Precautions / Restrictions Precautions Precautions: Fall Restrictions Weight Bearing Restrictions: No      Mobility  Bed Mobility Overal bed mobility: Needs Assistance Bed Mobility: Supine to Sit;Sit to Supine     Supine to sit: Supervision Sit to supine: Supervision   General bed mobility comments: Supervision for safety. Increased time required.   Transfers Overall transfer level: Needs assistance Equipment used: None Transfers: Sit to/from Stand Sit to Stand: Min guard         General transfer comment: Min guard for safety.   Ambulation/Gait Ambulation/Gait assistance: Min guard;Min assist Ambulation Distance (Feet): 100 Feet Assistive device: None Gait Pattern/deviations: Step-through pattern;Decreased stride length Gait velocity: Decreased  Gait velocity interpretation: <1.31 ft/sec, indicative of household ambulator General Gait Details: Slight unsteadiness noted requiring occasional min A. Otherwise requiring min guard A for steadying assist. Pt very guarded throughout gait. Pt requesting AD for home  to increase balance. Educated about use of cane at home.   Stairs            Wheelchair Mobility    Modified Rankin (Stroke Patients Only)       Balance Overall balance assessment: Needs assistance Sitting-balance support: No upper extremity supported;Feet supported Sitting balance-Leahy Scale: Good     Standing balance support: No upper extremity supported;During functional activity Standing balance-Leahy Scale: Fair                               Pertinent Vitals/Pain Pain Assessment: No/denies pain    Home Living Family/patient expects to be discharged to:: Private residence Living Arrangements: Spouse/significant other Available Help at Discharge: Family;Available PRN/intermittently Type of Home: House Home Access: Stairs to enter Entrance Stairs-Rails: Right;Left;Can reach both Entrance Stairs-Number of Steps: 3 Home Layout: One level Home Equipment: None      Prior Function Level of Independence: Independent               Hand Dominance   Dominant Hand: Right    Extremity/Trunk Assessment   Upper Extremity Assessment Upper Extremity Assessment: LUE deficits/detail LUE Deficits / Details: Pt reports some LUE weakness, however, did not note significant difference in LUE from RUE.     Lower Extremity Assessment Lower Extremity Assessment: Overall WFL for tasks assessed    Cervical / Trunk Assessment Cervical / Trunk Assessment: Normal  Communication   Communication: No difficulties  Cognition Arousal/Alertness: Awake/alert Behavior During Therapy: WFL for tasks assessed/performed Overall Cognitive Status: Within Functional Limits for tasks assessed  General Comments General comments (skin integrity, edema, etc.): Educated about signs and symptoms of CVA and importance of getting back to hospital ASAP if pt experiences.     Exercises     Assessment/Plan    PT  Assessment Patient needs continued PT services  PT Problem List Decreased balance;Decreased mobility;Decreased knowledge of use of DME       PT Treatment Interventions DME instruction;Gait training;Stair training;Functional mobility training;Therapeutic activities;Therapeutic exercise;Balance training;Patient/family education    PT Goals (Current goals can be found in the Care Plan section)  Acute Rehab PT Goals Patient Stated Goal: to go home  PT Goal Formulation: With patient Time For Goal Achievement: 11/07/17 Potential to Achieve Goals: Good    Frequency Min 3X/week   Barriers to discharge Decreased caregiver support      Co-evaluation               AM-PAC PT "6 Clicks" Daily Activity  Outcome Measure Difficulty turning over in bed (including adjusting bedclothes, sheets and blankets)?: None Difficulty moving from lying on back to sitting on the side of the bed? : A Little Difficulty sitting down on and standing up from a chair with arms (e.g., wheelchair, bedside commode, etc,.)?: Unable Help needed moving to and from a bed to chair (including a wheelchair)?: A Little Help needed walking in hospital room?: A Little Help needed climbing 3-5 steps with a railing? : A Little 6 Click Score: 17    End of Session Equipment Utilized During Treatment: Gait belt Activity Tolerance: Patient tolerated treatment well Patient left: in bed;with call bell/phone within reach Nurse Communication: Mobility status PT Visit Diagnosis: Unsteadiness on feet (R26.81)    Time: 1044-1100 PT Time Calculation (min) (ACUTE ONLY): 16 min   Charges:   PT Evaluation $PT Eval Low Complexity: 1 Low     PT G Codes:        [name], PT, DPT  Acute Rehabilitation Services  Pager: [phone]   [name] 10/24/2017, 11:31 AM

## 2017-10-24 NOTE — ED Notes (Signed)
Meal Tray delivered and at bedside.  

## 2017-10-24 NOTE — ED Notes (Signed)
Pharmacy notified of need for lovenox

## 2017-10-24 NOTE — ED Notes (Signed)
Family at bedside. 

## 2017-10-24 NOTE — Progress Notes (Signed)
PROGRESS NOTE    Mackenzie Patterson  NWG:[id number] DOB: [birth year] DOA: 10/23/2017 PCP: Clent Demark, PA-C  Outpatient Specialists:   Brief Narrative: Patient is a 47 year old African-American female with past medical history significant for VSD being followed at [facility], hypertension, pulmonary hypertension, diabetes mellitus type 2.  Patient presented with left upper extremity weakness, left facial droop, and slurred speech.  Patient is currently being worked up for TIA/acute CVA.  Patient is on EC aspirin 325 mg p.o. once daily.  Echo is pending.  Due to VSD, Doppler ultrasound of the lower extremities has been ordered as well.  Fasting lipid profile and A1c are noted.  Neurology team input is highly appreciated.  Assessment & Plan:   Principal Problem:   TIA (transient ischemic attack) Active Problems:   Essential hypertension   Diabetes mellitus ([place])   VSD (ventricular septal defect), perimembranous   Pulmonary hypertension, primary ([place])   Transient ischemic attack:  -Continue aspirin. -Neurology input is appreciated.  Complete work-up was recommended by the neurology team. -MRI/MRA of the brain and MRA of the neck have not revealed any significant finding. -Foley echocardiogram. -LDL is in the 80s. -Follow results of Doppler ultrasound of lower extremities. -Further management will depend on hospital course.    Hypertension: - Allow for permissive hypertension.  Diabetes mellitus type 2: -A1c is 5.5%. -Continue to optimize.  Microcytic hypochromic anemia: -Suspect related to iron deficiency. -Further work-up by patient's PCP.  History of VSD and pulmonary hypertension -Followed up by cardiologist at [facility].   DVT prophylaxis: Lovenox. Code Status: Full code. Family Communication: Discussed with patient. Disposition Plan: Home. Consults called: Neurologist. Admission status: Observation   Procedures:    None  Antimicrobials:   None   Subjective: Neurological symptoms have resolved.  Left upper extremity weakness has resolved.  Patient is a poor historian.  This may be related to patient's current speech problems.  Objective: Vitals:   10/24/17 1311 10/24/17 1312 10/24/17 1400 10/24/17 1642  BP: 112/72  115/76 127/80  Pulse:  92 87 83  Resp:  12  14  Temp:    97.7 F (36.5 C)  TempSrc:    Oral  SpO2:  99% 98% 99%  Weight:      Height:       No intake or output data in the 24 hours ending 10/24/17 2038 Filed Weights   10/24/17 0200  Weight: 75.5 kg (166 lb 7.2 oz)    Examination:  General exam: Appears calm and comfortable.   Respiratory system: Clear to auscultation. Cardiovascular system: S1 & S2, heart murmur (machinery)  Gastrointestinal system: Abdomen is nondistended, soft and nontender. No organomegaly or masses felt. Normal bowel sounds heard. Central nervous system: Alert and oriented. No focal neurological deficits.   Extremities: Symmetric 5 x 5 power.  Data Reviewed: I have personally reviewed following labs and imaging studies  CBC: Recent Labs  Lab 10/23/17 2352 10/23/17 2358 10/24/17 0305  WBC 6.4  --  6.1  NEUTROABS 4.0  --   --   HGB 11.0* 11.9* 10.9*  HCT 35.1* 35.0* 34.8*  MCV 75.2*  --  75.5*  PLT 212  --  578   Basic Metabolic Panel: Recent Labs  Lab 10/23/17 2352 10/23/17 2358 10/24/17 0305  NA 137 140  --   K 3.7 3.7  --   CL 107 104  --   CO2 25  --   --   GLUCOSE 130* 126*  --  BUN 5* 4*  --   CREATININE 0.73 0.70 0.74  CALCIUM 8.9  --   --    GFR: Estimated Creatinine Clearance: 78.6 mL/min (by C-G formula based on SCr of 0.74 mg/dL). Liver Function Tests: Recent Labs  Lab 10/23/17 2352  AST 25  ALT 23  ALKPHOS 54  BILITOT 0.6  PROT 7.1  ALBUMIN 3.9   No results for input(s): LIPASE, AMYLASE in the last 168 hours. No results for input(s): AMMONIA in the last 168 hours. Coagulation Profile: Recent Labs   Lab 10/23/17 2352  INR 0.99   Cardiac Enzymes: No results for input(s): CKTOTAL, CKMB, CKMBINDEX, TROPONINI in the last 168 hours. BNP (last 3 results) No results for input(s): PROBNP in the last 8760 hours. HbA1C: Recent Labs    10/24/17 0305  HGBA1C 5.5   CBG: Recent Labs  Lab 10/23/17 2354 10/24/17 1037 10/24/17 1217 10/24/17 1629  GLUCAP 132* 99 134* 93   Lipid Profile: Recent Labs    10/24/17 0305  CHOL 145  HDL 45  LDLCALC 83  TRIG 87  CHOLHDL 3.2   Thyroid Function Tests: No results for input(s): TSH, T4TOTAL, FREET4, T3FREE, THYROIDAB in the last 72 hours. Anemia Panel: No results for input(s): VITAMINB12, FOLATE, FERRITIN, TIBC, IRON, RETICCTPCT in the last 72 hours. Urine analysis:    Component Value Date/Time   COLORURINE YELLOW 03/31/2016 1433   APPEARANCEUR CLOUDY (A) 03/31/2016 1433   LABSPEC 1.031 (H) 03/31/2016 1433   PHURINE 5.5 03/31/2016 1433   GLUCOSEU NEGATIVE 03/31/2016 1433   HGBUR NEGATIVE 03/31/2016 1433   BILIRUBINUR small 11/14/2016 1536   KETONESUR NEGATIVE 03/31/2016 1433   PROTEINUR 30 mg/dl 11/14/2016 1536   PROTEINUR NEGATIVE 03/31/2016 1433   UROBILINOGEN 0.2 11/14/2016 1536   UROBILINOGEN 0.2 09/14/2014 1024   NITRITE negative 11/14/2016 1536   NITRITE NEGATIVE 03/31/2016 1433   LEUKOCYTESUR Negative 11/14/2016 1536   Sepsis Labs: @LABRCNTIP (procalcitonin:4,lacticidven:4)  )No results found for this or any previous visit (from the past 240 hour(s)).       Radiology Studies: Mr Angiogram Neck W Or Wo Contrast  Result Date: 10/24/2017 CLINICAL DATA:  Stroke follow-up. History of VSD. Left upper extremity weakness and slurred speech with left facial droop. Symptoms subsequently resolved EXAM: MR HEAD WITHOUT CONTRAST MR CIRCLE OF WILLIS WITHOUT CONTRAST MRA OF THE NECK WITHOUT AND WITH CONTRAST TECHNIQUE: Multiplanar, multiecho pulse sequences of the brain, circle of willis and surrounding structures were obtained  without intravenous contrast. Angiographic images of the neck were obtained using MRA technique without and with intravenous contrast. CONTRAST:  10mL MULTIHANCE GADOBENATE DIMEGLUMINE 529 MG/ML IV SOLN COMPARISON:  Head CT from earlier today FINDINGS: MR HEAD FINDINGS Brain: No acute infarction, hemorrhage, hydrocephalus, extra-axial collection or mass lesion. Vascular: Arterial findings below. 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 Codominant vertebral arteries. Dominant left PICA and right AICA. No branch occlusion, beading, stenosis, or aneurysm. MRA NECK FINDINGS Time-of-flight neck MRA shows antegrade flow in the bilateral carotid and vertebral circulation. There is no beading, stenosis, or ulceration. 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 There is apparent narrowing at the proximal left vertebral artery, favored artifactual based on source images. IMPRESSION: Brain MRI: Negative.  No infarct or explanation for symptoms. Intracranial MRA: Variant circle-of-Willis anatomy.  No pathologic finding. Neck MRA: Narrow appearance of the proximal left vertebral artery is favored artifactual. Otherwise negative. Electronically Signed   By: Monte Fantasia M.D.   On: 10/24/2017 09:16   Mr Brain Wo Contrast  Result Date: 10/24/2017 CLINICAL DATA:  Stroke follow-up. History of VSD. Left upper extremity weakness and slurred speech with left facial droop. Symptoms subsequently resolved EXAM: MR HEAD WITHOUT CONTRAST MR CIRCLE OF WILLIS WITHOUT CONTRAST MRA OF THE NECK WITHOUT AND WITH CONTRAST TECHNIQUE: Multiplanar, multiecho pulse sequences of the brain, circle of willis and surrounding structures were obtained without intravenous contrast. Angiographic  images of the neck were obtained using MRA technique without and with intravenous contrast. CONTRAST:  19mL MULTIHANCE GADOBENATE DIMEGLUMINE 529 MG/ML IV SOLN COMPARISON:  Head CT from earlier today FINDINGS: MR HEAD FINDINGS Brain: No acute infarction, hemorrhage, hydrocephalus, extra-axial collection or mass lesion. Vascular: Arterial findings below. 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 Codominant vertebral arteries. Dominant left PICA and right AICA. No branch occlusion, beading, stenosis, or aneurysm. MRA NECK FINDINGS Time-of-flight neck MRA shows antegrade flow in the bilateral carotid and vertebral circulation. There is no beading, stenosis, or ulceration. 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 There is apparent narrowing at the proximal left vertebral artery, favored artifactual based on source images. IMPRESSION: Brain MRI: Negative.  No infarct or explanation for symptoms. Intracranial MRA: Variant circle-of-Willis anatomy.  No pathologic finding. Neck MRA: Narrow appearance of the proximal left vertebral artery is favored artifactual. Otherwise negative. Electronically Signed   By: Monte Fantasia M.D.   On: 10/24/2017 09:16   Mr Jodene Nam Head Wo Contrast  Result Date: 10/24/2017 CLINICAL DATA:  Stroke follow-up. History of VSD. Left upper extremity weakness and slurred speech with left facial droop. Symptoms subsequently resolved EXAM: MR HEAD WITHOUT CONTRAST MR CIRCLE OF WILLIS WITHOUT CONTRAST MRA OF THE NECK WITHOUT AND WITH CONTRAST TECHNIQUE: Multiplanar, multiecho pulse sequences of the brain, circle of willis and surrounding structures were obtained without intravenous contrast. Angiographic images of the neck were obtained using  MRA technique without and with intravenous contrast. CONTRAST:  95mL MULTIHANCE GADOBENATE DIMEGLUMINE 529 MG/ML IV SOLN COMPARISON:  Head CT from earlier today FINDINGS: MR HEAD FINDINGS Brain: No acute infarction, hemorrhage, hydrocephalus, extra-axial collection or mass lesion. Vascular: Arterial findings below. 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 Codominant vertebral arteries. Dominant left PICA and right AICA. No branch occlusion, beading, stenosis, or aneurysm. MRA NECK FINDINGS Time-of-flight neck MRA shows antegrade flow in the bilateral carotid and vertebral circulation. There is no beading, stenosis, or ulceration. 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 There is apparent narrowing at the proximal left vertebral artery, favored artifactual based on source images. IMPRESSION: Brain MRI: Negative.  No infarct or explanation for symptoms. Intracranial MRA: Variant circle-of-Willis anatomy.  No pathologic finding. Neck MRA: Narrow appearance of the proximal left vertebral artery is favored artifactual. Otherwise negative. Electronically Signed   By: Monte Fantasia M.D.   On: 10/24/2017 09:16   Ct Head Code Stroke Wo Contrast  Result Date: 10/24/2017 CLINICAL DATA:  Code stroke. Initial evaluation for left leg and arm droop. EXAM: CT HEAD WITHOUT CONTRAST TECHNIQUE: Contiguous axial images were obtained from the base of the skull through the vertex without intravenous contrast. COMPARISON:  Prior MRI from 11/21/2011. FINDINGS: Brain: Age-appropriate cerebral atrophy. Mild chronic small vessel ischemic disease. Probable small remote lacunar infarct noted within the right thalamus. No acute large vessel territory infarct. No acute intracranial  hemorrhage. No mass lesion, midline shift or mass effect. No hydrocephalus. No extra-axial fluid collection. Vascular: No hyperdense vessel. Skull: Scalp soft tissues and calvarium within normal limits. Sinuses/Orbits: Globes and orbital soft tissues within normal limits. Paranasal sinuses mastoid air cells are clear. Other: None. ASPECTS [facility] Stroke Program Early CT Score) - Ganglionic level infarction (caudate, lentiform nuclei, internal capsule, insula, M1-M3 cortex): 7 - Supraganglionic infarction (M4-M6 cortex): 3 Total score (0-10 with 10 being normal): 10 IMPRESSION: 1. No acute intracranial infarct or other process identified. 2. ASPECTS is 10. 3. 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 These results were communicated to Rory Percy at 12:25 amon 4/30/2019by text page via the [facility] messaging system. Electronically Signed   By: Jeannine Boga M.D.   On: 10/24/2017 00:29        Scheduled Meds: .  stroke: mapping our early stages of recovery book   Does not apply Once  . amLODipine  5 mg Oral Daily  . aspirin EC  325 mg Oral Daily  . atorvastatin  80 mg Oral q1800  . carvedilol  12.5 mg Oral BID WC  . enoxaparin (LOVENOX) injection  40 mg Subcutaneous Q24H  . insulin aspart  0-9 Units Subcutaneous TID WC   Continuous Infusions: . sodium chloride 10 mL/hr at 10/24/17 1037     LOS: 0 days    Time spent: 35 minutes    Dana Allan, MD  [facility] Pager #: [phone] 7PM-7AM contact night coverage as above

## 2017-10-24 NOTE — ED Notes (Signed)
Patient transported to MRI 

## 2017-10-24 NOTE — Consult Note (Addendum)
Neurology Consultation  Reason for Consult: Code stroke Referring Physician: Dr. Leonides Schanz  CC: Left-sided weakness, left facial droop, slurred speech  History is obtained from: Patient, family bedside, chart  HPI: Mackenzie Patterson is a 47 y.o. female past medical history of diabetes, heart murmur from ventricular septal defect, hypertension, obesity, pulmonary hypertension, migraines as a young adult and daily headaches now for many months, presents for evaluation the emergency room for sudden onset of left arm numbness and weakness.  She said that she was in her usual state of health until about 11:15 PM on 10/23/2017, when she noticed that her left arm suddenly became numb and she was not able to lift it. She said that this was very concerning for her and she asked for for help.  Her family was at home, also noted that she had left lower facial weakness and her speech was slurred at that time. Her symptoms lasted about 30 minutes before nearly resolving at this time. She had had similar symptoms but involving the right side a few weeks ago and was seen in the hospital in [place], where she was told that she had a TIA.  She did not get an MRI of the brain at that time. At this time, she denies any chest pain or palpitations.  She does have occasional palpitations though. Denies any fevers or chills. Reports ongoing nausea for months and says she is unable to keep her food down. Reports mild headache.  Reports daily headaches when she wakes up.  Denies any snoring.  At this time, I am unable to review any records from her admission in [place].  LKW: 11:15 PM on 10/23/2017 tpa given?: no, NIH 1, symptoms rapidly improving. Premorbid modified Rankin scale (mRS): 0  ROS: A 14 point ROS was performed and is negative except as noted in the HPI.   Past Medical History:  Diagnosis Date  . Asthma   . Breast discharge 01/10/2017  . Congenital ventricular septal defect   . Diabetes mellitus  without complication ([place])   . Heart murmur   . Hypertension   . Obesity   . Pulmonary hypertension ([facility])    mild   . Tricuspid regurgitation   . VSD (ventricular septal defect), perimembranous    Qp:Qs 1.7:1 by catheterization     Family History  Problem Relation Age of Onset  . Cancer Other   . Hyperlipidemia Other   . Hypertension Other   . Asthma Other   . Diabetes Mother   . Diabetes Father   Multiple family members including father, mother and grandfather have had strokes. Paternal grandfather also had a massive MI. Father has a history of coronary artery disease. Members with hypertension and hyperlipidemia. Multiple family members with diabetes including parents.  Social History:   reports that she has never smoked. She has never used smokeless tobacco. She reports that she does not drink alcohol or use drugs. Denies alcohol, tobacco or illicit drug use.  Medications No current facility-administered medications for this encounter.   Current Outpatient Medications:  .  amLODipine (NORVASC) 5 MG tablet, Take 1 tablet (5 mg total) by mouth daily., Disp: 90 tablet, Rfl: 3 .  aspirin 81 MG chewable tablet, Chew by mouth daily., Disp: , Rfl:  .  Blood Glucose Monitoring Suppl (ACCU-CHEK AVIVA PLUS) w/Device KIT, 1 kit by Does not apply route daily., Disp: 1 kit, Rfl: 0 .  carvedilol (COREG) 12.5 MG tablet, Take 1 tablet (12.5 mg total) by mouth 2 (two) times  daily with a meal., Disp: 90 tablet, Rfl: 1 .  clonazePAM (KLONOPIN) 0.5 MG tablet, Take 1 tablet (0.5 mg total) by mouth at bedtime., Disp: 30 tablet, Rfl: 0 .  escitalopram (LEXAPRO) 10 MG tablet, Take 1 tablet (10 mg total) by mouth daily., Disp: 30 tablet, Rfl: 2 .  naproxen (NAPROSYN) 500 MG tablet, Take 1 tablet (500 mg total) by mouth 2 (two) times daily with a meal., Disp: 30 tablet, Rfl: 0 .  sitaGLIPtin-metformin (JANUMET) 50-1000 MG tablet, Take 1 tablet by mouth 2 (two) times daily with a meal., Disp: 60  tablet, Rfl: 11  Exam: Current vital signs: BP (!) 154/96 (BP Location: Right Arm)   Pulse 96   Temp 99.1 F (37.3 C) (Oral)   Resp 20   SpO2 99%  Vital signs in last 24 hours: Temp:  [99.1 F (37.3 C)] 99.1 F (37.3 C) (04/29 2349) Pulse Rate:  [96] 96 (04/29 2349) Resp:  [20] 20 (04/29 2349) BP: (154)/(96) 154/96 (04/29 2349) SpO2:  [99 %] 99 % (04/29 2349)  GENERAL: Awake, alert in NAD HEENT: - Normocephalic and atraumatic, dry mm, no LN++, no Thyromegally LUNGS - Clear to auscultation bilaterally with no wheezes CV - S1S2 RRR, no m/r/g, equal pulses bilaterally. ABDOMEN - Soft, nontender, nondistended with normoactive BS Ext: warm, well perfused, intact peripheral pulses, no edema NEURO:  Mental Status: AA&Ox3  Language: speech is clear.  Naming, repetition, fluency, and comprehension intact. Cranial Nerves: PERRL EOMI, visual fields full, no facial asymmetry, facial sensation intact, hearing intact, tongue/uvula/soft palate midline, normal sternocleidomastoid and trapezius muscle strength. No evidence of tongue atrophy or fibrillations Motor: Essentially 5/5 all over but on assessment of vertical drift, there was very mild vertical drift on the left arm. Tone: is normal and bulk is normal Sensation- Intact to light touch bilaterally Coordination: FTN intact bilaterally, no ataxia in BLE. Gait-no ataxia.  NIHSS-1 for left arm drift.   Labs I have reviewed labs in epic and the results pertinent to this consultation are:  CBC    Component Value Date/Time   WBC 6.4 10/23/2017 2352   RBC 4.67 10/23/2017 2352   HGB 11.9 (L) 10/23/2017 2358   HGB 10.6 (L) 12/19/2016 1104   HCT 35.0 (L) 10/23/2017 2358   HCT 33.7 (L) 12/19/2016 1104   PLT 212 10/23/2017 2352   PLT 209 12/19/2016 1104   MCV 75.2 (L) 10/23/2017 2352   MCV 77 (L) 12/19/2016 1104   MCH 23.6 (L) 10/23/2017 2352   MCHC 31.3 10/23/2017 2352   RDW 17.6 (H) 10/23/2017 2352   RDW 18.3 (H) 12/19/2016 1104    LYMPHSABS 1.8 10/23/2017 2352   LYMPHSABS 1.8 12/19/2016 1104   MONOABS 0.6 10/23/2017 2352   EOSABS 0.1 10/23/2017 2352   EOSABS 0.1 12/19/2016 1104   BASOSABS 0.0 10/23/2017 2352   BASOSABS 0.0 12/19/2016 1104    CMP     Component Value Date/Time   NA 140 10/23/2017 2358   NA 138 08/31/2016 1157   K 3.7 10/23/2017 2358   CL 104 10/23/2017 2358   CO2 19 (L) 02/21/2017 1017   GLUCOSE 126 (H) 10/23/2017 2358   BUN 4 (L) 10/23/2017 2358   BUN 10 08/31/2016 1157   CREATININE 0.70 10/23/2017 2358   CREATININE 0.73 02/21/2017 1017   CALCIUM 9.0 02/21/2017 1017   PROT 6.9 02/21/2017 1017   ALBUMIN 4.0 02/21/2017 1017   AST 19 02/21/2017 1017   ALT 13 02/21/2017 1017   ALKPHOS 61  02/21/2017 1017   BILITOT 0.4 02/21/2017 1017   GFRNONAA 87 08/31/2016 1157   GFRAA 101 08/31/2016 1157    Imaging I have reviewed the images obtained:  CT-scan of the brain noncontrast-aspect stent.  No acute changes.  No bleed.  No signs of evolving ischemia.  Assessment:  47 year old with past history of diabetes, ventricular septal defect, hypertension, obesity, migraines as a young adult and persistent daily headaches presenting for evaluation of left-sided numbness and weakness along with dysarthria, that lasted for about 30 minutes. In the past few weeks, she has had similar symptoms involving the right side.  At one point she was seen in [place] for the right-sided symptoms and told that she has a TIA. Details of that work-up are not available at this time. Although she is young in terms of age, for stroke to be high on differentials, her comorbidities and recurrent presentation with focal neurological deficits make stroke/TIA high on the differential. Other differentials to consider could be neurological symptoms related to migraines-complex migraine or obstructive sleep apnea causing persistent daily headaches. In either case, I think she would benefit from a full stroke  work-up  Impression: Evaluate for stroke/TIA Evaluate for complex migraine Outpatient evaluation for sleep apnea  Recommendations: -Continue frequent neuro checks in the ER.  She is still within the window for IV TPA till 0345 hrs, should her neurological exam worsen. Please call neurology stat should her exam worsen. -Admit to hospitalist or observation -Telemetry monitoring -Allow for permissive hypertension for the first 24-48h - only treat PRN if SBP >220 mmHg. Blood pressures can be gradually normalized to SBP<140 upon discharge. -MRI brain without contrast -MRA head without contrast, MRA neck with contrast -Echocardiogram -HgbA1c, fasting lipid panel -Frequent neuro checks -Prophylactic therapy-Antiplatelet med: Aspirin - dose 326m PO or 3053mPR -Atorvastatin 80 mg PO daily -Risk factor modification -PT consult, OT consult, Speech consult -Outpatient sleep study. -Outpatient neurology follow-up after the inpatient work-up is completed. -Final recommendations per stroke team after imaging and other test results are available.  Please page stroke NP/PA/MD (listed on AMION)  from 8am-4 pm as this patient will be followed by the stroke team at this point.  -- AsAmie PortlandMD [facility] Pager: [phone] 7pm to 7am, please call on call as listed on AMION.

## 2017-10-24 NOTE — ED Triage Notes (Signed)
Pt was lying in bed at 2315 noticed she was having difficulty raising her L arm; weakness noted to L grip, weakness and drift noted to L arm and leg, l sided facial droop, and slurred speech at times. Denies hx of CVA; has family hx of cva

## 2017-10-24 NOTE — H&P (Signed)
History and Physical    Mackenzie Patterson YFV:[id number] DOB: [birth year] DOA: 10/23/2017  PCP: Clent Demark, PA-C  Patient coming from: Home.  Chief Complaint: Left upper extremity weakness.  HPI: Mackenzie Patterson is a 47 y.o. female with history of VSD being followed at [facility], hypertension, pulmonary hypertension, diabetes mellitus type 2 presents to the ER after patient started developing left upper extremity weakness around 11 PM.  At that time patient also had some slurred speech and left facial droop.  Left facial droop was noticed by patient's boyfriend.  Whole symptoms lasted for about half hour following which it resolved completely.  Patient had a similar episode 2 weeks ago involving the right side.  ED Course: In the ER CT head is unremarkable and neurologist on-call was consulted.  Neurologist has recommended admission for TIA work-up.  On my exam patient is nonfocal.  Denies any chest pain or shortness of breath or headache.   Review of Systems: As per HPI, rest all negative.   Past Medical History:  Diagnosis Date  . Asthma   . Breast discharge 01/10/2017  . Congenital ventricular septal defect   . Diabetes mellitus without complication ([place])   . Heart murmur   . Hypertension   . Obesity   . Pulmonary hypertension (HCC)    mild   . Tricuspid regurgitation   . VSD (ventricular septal defect), perimembranous    Qp:Qs 1.7:1 by catheterization     Past Surgical History:  Procedure Laterality Date  . CARDIAC CATHETERIZATION [name] 10/30/2014   Procedure: Right Heart Cath;  Surgeon: [name], MD;  Location: [facility] INVASIVE CV LAB CUPID;  Service: Cardiovascular;  Laterality: [name];  . CHOLECYSTECTOMY  2018  . CYST EXCISION    . LEFT AND RIGHT HEART CATHETERIZATION WITH CORONARY ANGIOGRAM [name] 09/11/2013   Procedure: LEFT AND RIGHT HEART CATHETERIZATION WITH CORONARY ANGIOGRAM;  Surgeon: [name], MD;  Location: [facility] CATH LAB;  Service: Cardiovascular;  Laterality:  [name];  . RIGHT HEART CATH [name] 09/02/2016   Procedure: Right Heart Cath;  Surgeon: [name], MD;  Location: [place] CV LAB;  Service: Cardiovascular;  Laterality: [name];  . TUBAL LIGATION       reports that she has never smoked. She has never used smokeless tobacco. She reports that she does not drink alcohol or use drugs.  Allergies  Allergen Reactions  . Penicillins Anaphylaxis    Has patient had a PCN reaction causing immediate rash, facial/tongue/throat swelling, SOB or lightheadedness with hypotension: Yes Has patient had a PCN reaction causing severe rash involving mucus membranes or skin necrosis: Yes Has patient had a PCN reaction that required hospitalization Yes- went to ED, was not admitted Has patient had a PCN reaction occurring within the last 10 years: No- longer then 10 years If all of the above answers are "NO", then may proceed with Cephalosporin use.   . Tape Rash    Other reaction(s): Other (See Comments) Burns skin  . Other Rash    Other reaction(s): Other (See Comments) **EKG GEL PADS**  "Burns my skin" Pt states she is allergic to a blood pressure medication, unsure.  **EKG GEL PADS**  "Burns my skin"  . Aspirin Other (See Comments)    Burns stomach   . Latex Rash    Family History  Problem Relation Age of Onset  . Cancer Other   . Hyperlipidemia Other   . Hypertension Other   . Asthma Other   . Diabetes  Mother   . Diabetes Father     Prior to Admission medications   Medication Sig Start Date End Date Taking? Authorizing Provider  acetaminophen (TYLENOL) 500 MG tablet Take 1,000 mg by mouth every 6 (six) hours as needed for mild pain.   Yes [provider]  amLODipine (NORVASC) 5 MG tablet Take 1 tablet (5 mg total) by mouth daily. 10/16/17  Yes Clent Demark, PA-C  Blood Glucose Monitoring Suppl (ACCU-CHEK AVIVA PLUS) w/Device KIT 1 kit by Does not apply route daily. 12/19/16  Yes Clent Demark, PA-C  carvedilol (COREG) 12.5  MG tablet Take 1 tablet (12.5 mg total) by mouth 2 (two) times daily with a meal. 07/18/17  Yes Clent Demark, PA-C  diphenhydrAMINE-APAP, sleep, (EXCEDRIN PM PO) Take 1-2 tablets by mouth daily as needed (headache).   Yes [provider]  sitaGLIPtin-metformin (JANUMET) 50-1000 MG tablet Take 1 tablet by mouth 2 (two) times daily with a meal. 11/30/16  Yes Clent Demark, PA-C  furosemide (LASIX) 20 MG tablet Take 1 tablet (20 mg total) by mouth daily. Patient not taking: Reported on 09/14/2014 03/25/14 09/14/14  [name], MD    Physical Exam: Vitals:   10/24/17 0115 10/24/17 0130 10/24/17 0145 10/24/17 0200  BP: 139/82 124/70 132/86 132/66  Pulse: 96 88 89 90  Resp: (!) 23 (!) 22 (!) 21 (!) 22  Temp:      TempSrc:      SpO2: 100% 100% 100% 100%      Constitutional: Moderately built and nourished. Vitals:   10/24/17 0115 10/24/17 0130 10/24/17 0145 10/24/17 0200  BP: 139/82 124/70 132/86 132/66  Pulse: 96 88 89 90  Resp: (!) 23 (!) 22 (!) 21 (!) 22  Temp:      TempSrc:      SpO2: 100% 100% 100% 100%   Eyes: Anicteric no pallor. ENMT: No discharge from the ears eyes nose or mouth. Neck: No JVD appreciated no mass felt. Respiratory: No rhonchi or crepitations. Cardiovascular: S1-S2 heard.  Systolic murmur appreciated. Abdomen: Soft nontender bowel sounds present. Musculoskeletal: No edema.  No joint effusion. Skin: No rash.  Skin appears warm. Neurologic: Alert awake oriented to time place and person.  Moves all extremities 5 x 5.  No facial asymmetry tongue is midline.  Pupils are equal and reacting to light. Psychiatric: Appears normal.  Normal affect.   Labs on Admission: I have personally reviewed following labs and imaging studies  CBC: Recent Labs  Lab 10/23/17 2352 10/23/17 2358  WBC 6.4  --   NEUTROABS 4.0  --   HGB 11.0* 11.9*  HCT 35.1* 35.0*  MCV 75.2*  --   PLT 212  --    Basic Metabolic Panel: Recent Labs  Lab 10/23/17 2352  10/23/17 2358  NA 137 140  K 3.7 3.7  CL 107 104  CO2 25  --   GLUCOSE 130* 126*  BUN 5* 4*  CREATININE 0.73 0.70  CALCIUM 8.9  --    GFR: CrCl cannot be calculated (Unknown ideal weight.). Liver Function Tests: Recent Labs  Lab 10/23/17 2352  AST 25  ALT 23  ALKPHOS 54  BILITOT 0.6  PROT 7.1  ALBUMIN 3.9   No results for input(s): LIPASE, AMYLASE in the last 168 hours. No results for input(s): AMMONIA in the last 168 hours. Coagulation Profile: Recent Labs  Lab 10/23/17 2352  INR 0.99   Cardiac Enzymes: No results for input(s): CKTOTAL, CKMB, CKMBINDEX, TROPONINI in  the last 168 hours. BNP (last 3 results) No results for input(s): PROBNP in the last 8760 hours. HbA1C: No results for input(s): HGBA1C in the last 72 hours. CBG: Recent Labs  Lab 10/23/17 2354  GLUCAP 132*   Lipid Profile: No results for input(s): CHOL, HDL, LDLCALC, TRIG, CHOLHDL, LDLDIRECT in the last 72 hours. Thyroid Function Tests: No results for input(s): TSH, T4TOTAL, FREET4, T3FREE, THYROIDAB in the last 72 hours. Anemia Panel: No results for input(s): VITAMINB12, FOLATE, FERRITIN, TIBC, IRON, RETICCTPCT in the last 72 hours. Urine analysis:    Component Value Date/Time   COLORURINE YELLOW 03/31/2016 1433   APPEARANCEUR CLOUDY (A) 03/31/2016 1433   LABSPEC 1.031 (H) 03/31/2016 1433   PHURINE 5.5 03/31/2016 1433   GLUCOSEU NEGATIVE 03/31/2016 1433   HGBUR NEGATIVE 03/31/2016 1433   BILIRUBINUR small 11/14/2016 1536   KETONESUR NEGATIVE 03/31/2016 1433   PROTEINUR 30 mg/dl 11/14/2016 1536   PROTEINUR NEGATIVE 03/31/2016 1433   UROBILINOGEN 0.2 11/14/2016 1536   UROBILINOGEN 0.2 09/14/2014 1024   NITRITE negative 11/14/2016 1536   NITRITE NEGATIVE 03/31/2016 1433   LEUKOCYTESUR Negative 11/14/2016 1536   Sepsis Labs: '@LABRCNTIP' (procalcitonin:4,lacticidven:4) )No results found for this or any previous visit (from the past 240 hour(s)).   Radiological Exams on Admission: Ct  Head Code Stroke Wo Contrast  Result Date: 10/24/2017 CLINICAL DATA:  Code stroke. Initial evaluation for left leg and arm droop. EXAM: CT HEAD WITHOUT CONTRAST TECHNIQUE: Contiguous axial images were obtained from the base of the skull through the vertex without intravenous contrast. COMPARISON:  Prior MRI from 11/21/2011. FINDINGS: Brain: Age-appropriate cerebral atrophy. Mild chronic small vessel ischemic disease. Probable small remote lacunar infarct noted within the right thalamus. No acute large vessel territory infarct. No acute intracranial hemorrhage. No mass lesion, midline shift or mass effect. No hydrocephalus. No extra-axial fluid collection. Vascular: No hyperdense vessel. Skull: Scalp soft tissues and calvarium within normal limits. Sinuses/Orbits: Globes and orbital soft tissues within normal limits. Paranasal sinuses mastoid air cells are clear. Other: None. ASPECTS [facility] Stroke Program Early CT Score) - Ganglionic level infarction (caudate, lentiform nuclei, internal capsule, insula, M1-M3 cortex): 7 - Supraganglionic infarction (M4-M6 cortex): 3 Total score (0-10 with 10 being normal): 10 IMPRESSION: 1. No acute intracranial infarct or other process identified. 2. ASPECTS is 10. 3. 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 These results were communicated to Rory Percy at 12:25 amon 4/30/2019by text page via the [facility] messaging system. Electronically Signed   By: Jeannine Boga M.D.   On: 10/24/2017 00:29    EKG: Independently reviewed.  Normal sinus rhythm with nonspecific ST-T changes.  Assessment/Plan Principal Problem:   TIA (transient ischemic attack) Active Problems:   Essential hypertension   Diabetes mellitus ([place])   VSD (ventricular septal defect), perimembranous   Pulmonary hypertension, primary ([place])    1. TIA -appreciate neurology consult and recommendations.  MRI/MRA brain, MRA of the neck with contrast, 2D  echo has been ordered.  Patient is on aspirin.  Patient has passed swallow.  Neurochecks.  Check hemoglobin A1c and lipid panel.  Physical therapy consult.  Allow for permissive hypertension. 2. Hypertension on amlodipine and beta-blockers.  Allow for permissive hypertension. 3. Diabetes mellitus type 2 -follow hemoglobin A1c.  While inpatient will hold oral hypoglycemics.  Patient on sliding scale coverage. 4. Microcytic hypochromic anemia -follow CBC trend if no further decline, then further work-up as outpatient. 5. History of VSD and pulmonary hypertension being followed by  cardiologist at [facility]   DVT prophylaxis: Lovenox. Code Status: Full code. Family Communication: Discussed with patient. Disposition Plan: Home. Consults called: Neurologist. Admission status: Observation.   Rise Patience MD [facility] Pager [phone].  If 7PM-7AM, please contact night-coverage [email or web address] Password [facility]  10/24/2017, 2:47 AM

## 2017-10-24 NOTE — ED Notes (Signed)
PT at bedside.

## 2017-10-24 NOTE — Progress Notes (Signed)
Patient arrived on unit 1509, no pain , A&O, No distress

## 2017-10-24 NOTE — Progress Notes (Signed)
Stroke Team Progress Note  Mackenzie Patterson is a 47 y.o. female past medical history of diabetes, heart murmur from ventricular septal defect, hypertension, obesity, pulmonary hypertension, migraines as a young adult and daily headaches now for many months, presents for evaluation the emergency room for sudden onset of left arm numbness and weakness.  She said that she was in her usual state of health until about 11:15 PM on 10/23/2017, when she noticed that her left arm suddenly became numb and she was not able to lift it. She said that this was very concerning for her and she asked for for help.  Her family was at home, also noted that she had left lower facial weakness and her speech was slurred at that time. Her symptoms lasted about 30 minutes before nearly resolving at this time. She had had similar symptoms but involving the right side a few weeks ago and was seen in the hospital in [place], where she was told that she had a TIA.  She did not get an MRI of the brain at that time. At this time, she denies any chest pain or palpitations.  She does have occasional palpitations though. Denies any fevers or chills. Reports ongoing nausea for months and says she is unable to keep her food down. Reports mild headache.  Reports daily headaches when she wakes up.  Denies any snoring.  At this time, I am unable to review any records from her admission in [place].  LKW: 11:15 PM on 10/23/2017 tpa given?: no, NIH 1, symptoms rapidly improving. Premorbid modified Rankin scale (mRS   SUBJECTIVE  I reviewed patient's history of present illness in details with her. She developed sudden onset of left   Facial droop, slurred speech and left upper extremityweakness last night which lasted a couple of hours and improved completely. She had similar episode affecting the right upper extremity month ago but did not seek medical help at that time. She does have risk factors of obesity, diabetes and  hypertension. She has history of ventricle septal defect with pulmonary hypertension and is followed at [facility] for that. She denies family history of strokes or heart attacks at a young age. She denies history of DVT or pulmonary embolism.  OBJECTIVE Most recent Vital Signs: Temp: 98.4 F (36.9 C) (04/30 1030) BP: 112/72 (04/30 1311) Pulse Rate: 92 (04/30 1312) Respiratory Rate: 12 O2 Saturdation: 99%  CBG (last 3)  Recent Labs    10/23/17 2354 10/24/17 1037 10/24/17 1217  GLUCAP 132* 99 134*    Diet:  Diet Order    None       Activity: Up with assistance   VTE Prophylaxis:  Not needed  Studies: Results for orders placed or performed during the hospital encounter of 10/23/17 (from the past 24 hour(s))  Protime-INR     Status: None   Collection Time: 10/23/17 11:52 PM  Result Value Ref Range   Prothrombin Time 13.0 11.4 - 15.2 seconds   INR 0.99   APTT     Status: None   Collection Time: 10/23/17 11:52 PM  Result Value Ref Range   aPTT 32 24 - 36 seconds  CBC     Status: Abnormal   Collection Time: 10/23/17 11:52 PM  Result Value Ref Range   WBC 6.4 4.0 - 10.5 K/uL   RBC 4.67 3.87 - 5.11 MIL/uL   Hemoglobin 11.0 (L) 12.0 - 15.0 g/dL   HCT 35.1 (L) 36.0 - 46.0 %   MCV 75.2 (L) 78.0 -  100.0 fL   MCH 23.6 (L) 26.0 - 34.0 pg   MCHC 31.3 30.0 - 36.0 g/dL   RDW 17.6 (H) 11.5 - 15.5 %   Platelets 212 150 - 400 K/uL  Differential     Status: None   Collection Time: 10/23/17 11:52 PM  Result Value Ref Range   Neutrophils Relative % 62 %   Neutro Abs 4.0 1.7 - 7.7 K/uL   Lymphocytes Relative 27 %   Lymphs Abs 1.8 0.7 - 4.0 K/uL   Monocytes Relative 9 %   Monocytes Absolute 0.6 0.1 - 1.0 K/uL   Eosinophils Relative 2 %   Eosinophils Absolute 0.1 0.0 - 0.7 K/uL   Basophils Relative 0 %   Basophils Absolute 0.0 0.0 - 0.1 K/uL  Comprehensive metabolic panel     Status: Abnormal   Collection Time: 10/23/17 11:52 PM  Result Value Ref Range   Sodium 137 135 - 145  mmol/L   Potassium 3.7 3.5 - 5.1 mmol/L   Chloride 107 101 - 111 mmol/L   CO2 25 22 - 32 mmol/L   Glucose, Bld 130 (H) 65 - 99 mg/dL   BUN 5 (L) 6 - 20 mg/dL   Creatinine, Ser 0.73 0.44 - 1.00 mg/dL   Calcium 8.9 8.9 - 10.3 mg/dL   Total Protein 7.1 6.5 - 8.1 g/dL   Albumin 3.9 3.5 - 5.0 g/dL   AST 25 15 - 41 U/L   ALT 23 14 - 54 U/L   Alkaline Phosphatase 54 38 - 126 U/L   Total Bilirubin 0.6 0.3 - 1.2 mg/dL   GFR calc non Af Amer >60 >60 mL/min   GFR calc Af Amer >60 >60 mL/min   Anion gap 5 5 - 15  CBG monitoring, ED     Status: Abnormal   Collection Time: 10/23/17 11:54 PM  Result Value Ref Range   Glucose-Capillary 132 (H) 65 - 99 mg/dL  I-stat troponin, ED     Status: None   Collection Time: 10/23/17 11:56 PM  Result Value Ref Range   Troponin i, poc 0.00 0.00 - 0.08 ng/mL   Comment 3          I-Stat Chem 8, ED     Status: Abnormal   Collection Time: 10/23/17 11:58 PM  Result Value Ref Range   Sodium 140 135 - 145 mmol/L   Potassium 3.7 3.5 - 5.1 mmol/L   Chloride 104 101 - 111 mmol/L   BUN 4 (L) 6 - 20 mg/dL   Creatinine, Ser 0.70 0.44 - 1.00 mg/dL   Glucose, Bld 126 (H) 65 - 99 mg/dL   Calcium, Ion 1.18 1.15 - 1.40 mmol/L   TCO2 24 22 - 32 mmol/L   Hemoglobin 11.9 (L) 12.0 - 15.0 g/dL   HCT 35.0 (L) 36.0 - 46.0 %  I-Stat beta hCG blood, ED     Status: None   Collection Time: 10/24/17 12:01 AM  Result Value Ref Range   I-stat hCG, quantitative <5.0 <5 mIU/mL   Comment 3          Hemoglobin A1c     Status: None   Collection Time: 10/24/17  3:05 AM  Result Value Ref Range   Hgb A1c MFr Bld 5.5 4.8 - 5.6 %   Mean Plasma Glucose 111.15 mg/dL  Lipid panel     Status: None   Collection Time: 10/24/17  3:05 AM  Result Value Ref Range   Cholesterol 145 0 - 200 mg/dL  Triglycerides 87 <150 mg/dL   HDL 45 >40 mg/dL   Total CHOL/HDL Ratio 3.2 RATIO   VLDL 17 0 - 40 mg/dL   LDL Cholesterol 83 0 - 99 mg/dL  CBC     Status: Abnormal   Collection Time: 10/24/17   3:05 AM  Result Value Ref Range   WBC 6.1 4.0 - 10.5 K/uL   RBC 4.61 3.87 - 5.11 MIL/uL   Hemoglobin 10.9 (L) 12.0 - 15.0 g/dL   HCT 34.8 (L) 36.0 - 46.0 %   MCV 75.5 (L) 78.0 - 100.0 fL   MCH 23.6 (L) 26.0 - 34.0 pg   MCHC 31.3 30.0 - 36.0 g/dL   RDW 17.7 (H) 11.5 - 15.5 %   Platelets 205 150 - 400 K/uL  Creatinine, serum     Status: None   Collection Time: 10/24/17  3:05 AM  Result Value Ref Range   Creatinine, Ser 0.74 0.44 - 1.00 mg/dL   GFR calc non Af Amer >60 >60 mL/min   GFR calc Af Amer >60 >60 mL/min  CBG monitoring, ED     Status: None   Collection Time: 10/24/17 10:37 AM  Result Value Ref Range   Glucose-Capillary 99 65 - 99 mg/dL  CBG monitoring, ED     Status: Abnormal   Collection Time: 10/24/17 12:17 PM  Result Value Ref Range   Glucose-Capillary 134 (H) 65 - 99 mg/dL     Mr Angiogram Neck W Or Wo Contrast  Result Date: 10/24/2017 CLINICAL DATA:  Stroke follow-up. History of VSD. Left upper extremity weakness and slurred speech with left facial droop. Symptoms subsequently resolved EXAM: MR HEAD WITHOUT CONTRAST MR CIRCLE OF WILLIS WITHOUT CONTRAST MRA OF THE NECK WITHOUT AND WITH CONTRAST TECHNIQUE: Multiplanar, multiecho pulse sequences of the brain, circle of willis and surrounding structures were obtained without intravenous contrast. Angiographic images of the neck were obtained using MRA technique without and with intravenous contrast. CONTRAST:  58mL MULTIHANCE GADOBENATE DIMEGLUMINE 529 MG/ML IV SOLN COMPARISON:  Head CT from earlier today FINDINGS: MR HEAD FINDINGS Brain: No acute infarction, hemorrhage, hydrocephalus, extra-axial collection or mass lesion. Vascular: Arterial findings below. Normal dural venous sinus flow voids Skull and upper cervical spine: Negative for marrow lesion Sinuses/Orbits: Negative MR CIRCLE OF WILLIS FINDINGS The right ICA is larger than the left in the setting of large right posterior communicating artery and aplastic left A1  segment. Codominant vertebral arteries. Dominant left PICA and right AICA. No branch occlusion, beading, stenosis, or aneurysm. MRA NECK FINDINGS Time-of-flight neck MRA shows antegrade flow in the bilateral carotid and vertebral circulation. There is no beading, stenosis, or ulceration. 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 There is apparent narrowing at the proximal left vertebral artery, favored artifactual based on source images. IMPRESSION: Brain MRI: Negative.  No infarct or explanation for symptoms. Intracranial MRA: Variant circle-of-Willis anatomy.  No pathologic finding. Neck MRA: Narrow appearance of the proximal left vertebral artery is favored artifactual. Otherwise negative. Electronically Signed   By: Monte Fantasia M.D.   On: 10/24/2017 09:16   Mr Brain Wo Contrast  Result Date: 10/24/2017 CLINICAL DATA:  Stroke follow-up. History of VSD. Left upper extremity weakness and slurred speech with left facial droop. Symptoms subsequently resolved EXAM: MR HEAD WITHOUT CONTRAST MR CIRCLE OF WILLIS WITHOUT CONTRAST MRA OF THE NECK WITHOUT AND WITH CONTRAST TECHNIQUE: Multiplanar, multiecho pulse sequences of the brain, circle of willis and surrounding structures were obtained without intravenous contrast. Angiographic images  of the neck were obtained using MRA technique without and with intravenous contrast. CONTRAST:  83mL MULTIHANCE GADOBENATE DIMEGLUMINE 529 MG/ML IV SOLN COMPARISON:  Head CT from earlier today FINDINGS: MR HEAD FINDINGS Brain: No acute infarction, hemorrhage, hydrocephalus, extra-axial collection or mass lesion. Vascular: Arterial findings below. 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 Codominant vertebral arteries.  Dominant left PICA and right AICA. No branch occlusion, beading, stenosis, or aneurysm. MRA NECK FINDINGS Time-of-flight neck MRA shows antegrade flow in the bilateral carotid and vertebral circulation. There is no beading, stenosis, or ulceration. 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 There is apparent narrowing at the proximal left vertebral artery, favored artifactual based on source images. IMPRESSION: Brain MRI: Negative.  No infarct or explanation for symptoms. Intracranial MRA: Variant circle-of-Willis anatomy.  No pathologic finding. Neck MRA: Narrow appearance of the proximal left vertebral artery is favored artifactual. Otherwise negative. Electronically Signed   By: Monte Fantasia M.D.   On: 10/24/2017 09:16   Mr Jodene Nam Head Wo Contrast  Result Date: 10/24/2017 CLINICAL DATA:  Stroke follow-up. History of VSD. Left upper extremity weakness and slurred speech with left facial droop. Symptoms subsequently resolved EXAM: MR HEAD WITHOUT CONTRAST MR CIRCLE OF WILLIS WITHOUT CONTRAST MRA OF THE NECK WITHOUT AND WITH CONTRAST TECHNIQUE: Multiplanar, multiecho pulse sequences of the brain, circle of willis and surrounding structures were obtained without intravenous contrast. Angiographic images of the neck were obtained using MRA technique without and with intravenous contrast. CONTRAST:  52mL MULTIHANCE GADOBENATE DIMEGLUMINE 529 MG/ML IV SOLN COMPARISON:  Head CT from earlier today FINDINGS: MR HEAD FINDINGS Brain: No acute infarction, hemorrhage, hydrocephalus, extra-axial collection or mass lesion. Vascular: Arterial findings below. 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 Codominant vertebral arteries. Dominant left PICA and right AICA. No  branch occlusion, beading, stenosis, or aneurysm. MRA NECK FINDINGS Time-of-flight neck MRA shows antegrade flow in the bilateral carotid and vertebral circulation. There is no beading, stenosis, or ulceration. 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 There is apparent narrowing at the proximal left vertebral artery, favored artifactual based on source images. IMPRESSION: Brain MRI: Negative.  No infarct or explanation for symptoms. Intracranial MRA: Variant circle-of-Willis anatomy.  No pathologic finding. Neck MRA: Narrow appearance of the proximal left vertebral artery is favored artifactual. Otherwise negative. Electronically Signed   By: Monte Fantasia M.D.   On: 10/24/2017 09:16   Ct Head Code Stroke Wo Contrast  Result Date: 10/24/2017 CLINICAL DATA:  Code stroke. Initial evaluation for left leg and arm droop. EXAM: CT HEAD WITHOUT CONTRAST TECHNIQUE: Contiguous axial images were obtained from the base of the skull through the vertex without intravenous contrast. COMPARISON:  Prior MRI from 11/21/2011. FINDINGS: Brain: Age-appropriate cerebral atrophy. Mild chronic small vessel ischemic disease. Probable small remote lacunar infarct noted within the right thalamus. No acute large vessel territory infarct. No acute intracranial hemorrhage. No mass lesion, midline shift or mass effect. No hydrocephalus. No extra-axial fluid collection. Vascular: No hyperdense vessel. Skull: Scalp soft tissues and calvarium within normal limits. Sinuses/Orbits:  Globes and orbital soft tissues within normal limits. Paranasal sinuses mastoid air cells are clear. Other: None. ASPECTS [facility] Stroke Program Early CT Score) - Ganglionic level infarction (caudate, lentiform nuclei, internal capsule, insula, M1-M3 cortex): 7 - Supraganglionic infarction (M4-M6 cortex): 3 Total score (0-10 with 10 being normal): 10 IMPRESSION: 1. No acute intracranial infarct or other process identified. 2. ASPECTS is 10.  3. 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 These results were communicated to Rory Percy at 12:25 amon 4/30/2019by text page via the [facility] messaging system. Electronically Signed   By: Jeannine Boga M.D.   On: 10/24/2017 00:29    Physical Exam:    Obese middle-aged [place] American lady currently not in distress. . Afebrile. Head is nontraumatic. Neck is supple without bruit.    Cardiac exam harsh holosystolic murmur heard all over the precordium with loud second heart sound or gallop. Lungs are clear to auscultation. Distal pulses are well felt. Neurological Exam ;  Awake  Alert oriented x 3. Normal speech and language.eye movements full without nystagmus.fundi were not visualized. Vision acuity and fields appear normal. Hearing is normal. Palatal movements are normal. Face symmetric. Tongue midline. Normal strength, tone, reflexes and coordination. Normal sensation. Gait deferred.  ASSESSMENT Ms. Kahlen Boyde is a 47 y.o. female with transient episode of left upper is a weakness likely due to right brain TIA. History of similar episode affecting the right upper extremity a month ago. Etiology probably small vessel disease but given her history of ventriculoseptal defect paradoxical embolism is also a possibility. Vascular risk factors of diabetes, hypertension, obesity Hospital day # 0  TREATMENT/PLAN  Recommend dual antiplatelet therapy of aspirin and Plavix for 3 weeks followed by aspirin alone. Check lower extremity venous Doppler for DVT. Check echocardiogram and carotid Dopplers. Check lipid profile and hemoglobin A1c. Physical occupational therapy consults. Long discussion of the bedside with the patient and answered questions about her plan of care and treatment. Greater than 50% time during this 35 minute visit was spent on counseling and coordination of care about TIA and discussion about stroke risk factors and treatment and  answered questions  Antony Contras, MD [facility] Pager: [phone] 10/24/2017 2:28 PM

## 2017-10-24 NOTE — ED Notes (Signed)
MRI called. Pt unable to tolerate MRI. MD [facility] aware.

## 2017-10-24 NOTE — ED Notes (Signed)
MD Rory Percy at bedside.

## 2017-10-24 NOTE — ED Notes (Signed)
Pt transported to CT ?

## 2017-10-24 NOTE — ED Notes (Signed)
Pt at bedside eating lunch

## 2017-10-25 ENCOUNTER — Other Ambulatory Visit: Payer: Self-pay

## 2017-10-25 ENCOUNTER — Observation Stay (HOSPITAL_BASED_OUTPATIENT_CLINIC_OR_DEPARTMENT_OTHER): Payer: Medicaid Other

## 2017-10-25 ENCOUNTER — Ambulatory Visit (HOSPITAL_BASED_OUTPATIENT_CLINIC_OR_DEPARTMENT_OTHER): Payer: Medicaid Other

## 2017-10-25 DIAGNOSIS — G459 Transient cerebral ischemic attack, unspecified: Secondary | ICD-10-CM | POA: Diagnosis not present

## 2017-10-25 DIAGNOSIS — I639 Cerebral infarction, unspecified: Secondary | ICD-10-CM | POA: Diagnosis not present

## 2017-10-25 DIAGNOSIS — I6789 Other cerebrovascular disease: Secondary | ICD-10-CM | POA: Diagnosis not present

## 2017-10-25 LAB — ECHOCARDIOGRAM COMPLETE
Height: 59 in
Weight: 2663.16 oz

## 2017-10-25 LAB — GLUCOSE, CAPILLARY
GLUCOSE-CAPILLARY: 125 mg/dL — AB (ref 65–99)
Glucose-Capillary: 116 mg/dL — ABNORMAL HIGH (ref 65–99)
Glucose-Capillary: 119 mg/dL — ABNORMAL HIGH (ref 65–99)
Glucose-Capillary: 137 mg/dL — ABNORMAL HIGH (ref 65–99)

## 2017-10-25 MED ORDER — CLOPIDOGREL BISULFATE 75 MG PO TABS
75.0000 mg | ORAL_TABLET | Freq: Every day | ORAL | Status: DC
  Administered 2017-10-25 – 2017-10-26 (×2): 75 mg via ORAL
  Filled 2017-10-25 (×2): qty 1

## 2017-10-25 NOTE — Progress Notes (Signed)
LE venous duplex prelim: negative for DVT. Lannie Yusuf Eunice, RDMS, RVT  

## 2017-10-25 NOTE — Care Management Note (Signed)
Case Management Note  Patient Details  Name: Mackenzie Patterson MRN: [id number] Date of Birth: [birth year]  Subjective/Objective:       Pt in with TIA. She is from home with her spouse.             Action/Plan: No f/u per PT/OT. Pt with orders for a cane. James with [facility] DME notified and will deliver to the room.  Pt has supervision at home with transportation home.   Expected Discharge Date:  10/26/17               Expected Discharge Plan:  Home/Self Care  In-House Referral:     Discharge planning Services  CM Consult  Post Acute Care Choice:  Durable Medical Equipment Choice offered to:  Patient  DME Arranged:  Kasandra Knudsen DME Agency:  [place]:    [facility] Agency:     Status of Service:  Completed, signed off  If discussed at [place] of Stay Meetings, dates discussed:    Additional Comments:  Pollie Friar, RN 10/25/2017, 12:24 PM

## 2017-10-25 NOTE — Evaluation (Signed)
Speech Language Pathology Evaluation Patient Details Name: Mackenzie Patterson MRN: [id number] DOB: Oct 19, 1970 Today's Date: 10/25/2017 Time: 0932-3557 SLP Time Calculation (min) (ACUTE ONLY): 13 min  Problem List:  Patient Active Problem List   Diagnosis Date Noted  . TIA (transient ischemic attack) 10/24/2017  . Menorrhagia 12/12/2016  . Ventricular septal defect (VSD), membranous 09/02/2016  . Pulmonary hypertension, primary ([place]) 08/23/2016  . Chest tightness 08/03/2016  . Cough 08/03/2016  . Dyspnea and respiratory abnormality 04/16/2014  . Tricuspid regurgitation   . Asthma   . VSD (ventricular septal defect), perimembranous 09/03/2013  . Pulmonary hypertension ([place]) 09/03/2013  . Essential hypertension 08/06/2013  . Diabetes mellitus ([place]) 08/06/2013  . Obesity 08/06/2013  . Hypertension 07/27/2012  . Awareness of heartbeats 07/27/2012  . 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 Perimembranous ventricular septal defect 07/27/2012   Past Medical History:  Past Medical History:  Diagnosis Date  . Asthma   . Breast discharge 01/10/2017  . Congenital ventricular septal defect   . Diabetes mellitus without complication ([place])   . Heart murmur   . Hypertension   . Obesity   . Pulmonary hypertension ([facility])    mild   . Tricuspid regurgitation   . VSD (ventricular septal defect), perimembranous    Qp:Qs 1.7:1 by catheterization    Past Surgical History:  Past Surgical History:  Procedure Laterality Date  . CARDIAC CATHETERIZATION [name] 10/30/2014   Procedure: Right Heart Cath;  Surgeon: [name], MD;  Location: [facility] INVASIVE CV LAB CUPID;  Service: Cardiovascular;  Laterality: [name];  . CHOLECYSTECTOMY  2018  . CYST EXCISION    . LEFT AND RIGHT HEART CATHETERIZATION WITH CORONARY ANGIOGRAM [name] 09/11/2013   Procedure: LEFT AND RIGHT HEART CATHETERIZATION WITH CORONARY ANGIOGRAM;  Surgeon: [name], MD;  Location: [facility] CATH LAB;  Service: Cardiovascular;  Laterality:  [name];  . RIGHT HEART CATH [name] 09/02/2016   Procedure: Right Heart Cath;  Surgeon: [name], MD;  Location: [place] CV LAB;  Service: Cardiovascular;  Laterality: [name];  . TUBAL LIGATION     HPI:  Patient is a 47 year old African-American female with past medical history significant forVSD being [facility] hypertension, pulmonary hypertension, diabetes mellitus type 2.Patient presented with left upper extremity weakness, left facial droop, and slurred speech. Patient is currently being worked up for TIA/acute CVA with MRI being negative.    Assessment / Plan / Recommendation Clinical Impression  Cognitive-linguistic evaluation complete. Cognitive linguistic skills have returned to baseline. No SLP f/u indicated.     SLP Assessment  SLP Recommendation/Assessment: Patient does not need any further Speech Lanaguage Pathology Services    Follow Up Recommendations  None          SLP Evaluation Cognition  Overall Cognitive Status: Within Functional Limits for tasks assessed       Comprehension  Auditory Comprehension Overall Auditory Comprehension: Appears within functional limits for tasks assessed Reading Comprehension Reading Status: Not tested    Expression Expression Primary Mode of Expression: Verbal Verbal Expression Overall Verbal Expression: Appears within functional limits for tasks assessed   Oral / Motor  Oral Motor/Sensory Function Overall Oral Motor/Sensory Function: Within functional limits Motor Speech Overall Motor Speech: Appears within functional limits for tasks assessed   Gabriel Rainwater MA, CCC-SLP [phone]                     Mackenzie Patterson 10/25/2017, 8:29 AM

## 2017-10-25 NOTE — Progress Notes (Signed)
Stroke Team Progress Note  Mackenzie Patterson is a 47 y.o. female past medical history of diabetes, heart murmur from ventricular septal defect, hypertension, obesity, pulmonary hypertension, migraines as a young adult and daily headaches now for many months, presents for evaluation the emergency room for sudden onset of left arm numbness and weakness.  She said that she was in her usual state of health until about 11:15 PM on 10/23/2017, when she noticed that her left arm suddenly became numb and she was not able to lift it. She said that this was very concerning for her and she asked for for help.  Her family was at home, also noted that she had left lower facial weakness and her speech was slurred at that time.     SUBJECTIVE There are multiple family members at the bedside. Patient is doing well without recurrent stroke or TIA symptoms. Lower extremity venous Dopplers yet pending OBJECTIVE Most recent Vital Signs: Temp: 97.9 F (36.6 C) (05/01 0818) Temp Source: Oral (05/01 0818) BP: 122/72 (05/01 1009) Pulse Rate: 84 (05/01 1009) Respiratory Rate: 18 O2 Saturdation: 100%  CBG (last 3)  Recent Labs    10/24/17 2152 10/25/17 0614 10/25/17 1039  GLUCAP 98 116* 125*    Diet:  Diet Order           Diet Carb Modified Fluid consistency: Thin; Room service appropriate? Yes  Diet effective now           Activity: Up with assistance   VTE Prophylaxis:  Not needed  Studies: Results for orders placed or performed during the hospital encounter of 10/23/17 (from the past 24 hour(s))  Glucose, capillary     Status: None   Collection Time: 10/24/17  4:29 PM  Result Value Ref Range   Glucose-Capillary 93 65 - 99 mg/dL   Comment 1 Notify RN    Comment 2 Document in Chart   Glucose, capillary     Status: None   Collection Time: 10/24/17  9:52 PM  Result Value Ref Range   Glucose-Capillary 98 65 - 99 mg/dL   Comment 1 Notify RN    Comment 2 Document in Chart   Glucose, capillary      Status: Abnormal   Collection Time: 10/25/17  6:14 AM  Result Value Ref Range   Glucose-Capillary 116 (H) 65 - 99 mg/dL   Comment 1 Notify RN    Comment 2 Document in Chart   Glucose, capillary     Status: Abnormal   Collection Time: 10/25/17 10:39 AM  Result Value Ref Range   Glucose-Capillary 125 (H) 65 - 99 mg/dL     Mr Angiogram Neck W Or Wo Contrast  Result Date: 10/24/2017 CLINICAL DATA:  Stroke follow-up. History of VSD. Left upper extremity weakness and slurred speech with left facial droop. Symptoms subsequently resolved EXAM: MR HEAD WITHOUT CONTRAST MR CIRCLE OF WILLIS WITHOUT CONTRAST MRA OF THE NECK WITHOUT AND WITH CONTRAST TECHNIQUE: Multiplanar, multiecho pulse sequences of the brain, circle of willis and surrounding structures were obtained without intravenous contrast. Angiographic images of the neck were obtained using MRA technique without and with intravenous contrast. CONTRAST:  3mL MULTIHANCE GADOBENATE DIMEGLUMINE 529 MG/ML IV SOLN COMPARISON:  Head CT from earlier today FINDINGS: MR HEAD FINDINGS Brain: No acute infarction, hemorrhage, hydrocephalus, extra-axial collection or mass lesion. Vascular: Arterial findings below. 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 Codominant vertebral arteries. Dominant left PICA and right AICA. No branch occlusion, beading, stenosis, or aneurysm. MRA NECK FINDINGS Time-of-flight neck MRA shows antegrade flow in the bilateral carotid and vertebral circulation. There is no beading, stenosis, or ulceration. 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 There is apparent narrowing at the proximal left vertebral artery, favored artifactual based on source images. IMPRESSION: Brain MRI: Negative.   No infarct or explanation for symptoms. Intracranial MRA: Variant circle-of-Willis anatomy.  No pathologic finding. Neck MRA: Narrow appearance of the proximal left vertebral artery is favored artifactual. Otherwise negative. Electronically Signed   By: Monte Fantasia M.D.   On: 10/24/2017 09:16   Mr Brain Wo Contrast  Result Date: 10/24/2017 CLINICAL DATA:  Stroke follow-up. History of VSD. Left upper extremity weakness and slurred speech with left facial droop. Symptoms subsequently resolved EXAM: MR HEAD WITHOUT CONTRAST MR CIRCLE OF WILLIS WITHOUT CONTRAST MRA OF THE NECK WITHOUT AND WITH CONTRAST TECHNIQUE: Multiplanar, multiecho pulse sequences of the brain, circle of willis and surrounding structures were obtained without intravenous contrast. Angiographic images of the neck were obtained using MRA technique without and with intravenous contrast. CONTRAST:  38mL MULTIHANCE GADOBENATE DIMEGLUMINE 529 MG/ML IV SOLN COMPARISON:  Head CT from earlier today FINDINGS: MR HEAD FINDINGS Brain: No acute infarction, hemorrhage, hydrocephalus, extra-axial collection or mass lesion. Vascular: Arterial findings below. 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 Codominant vertebral arteries. Dominant left PICA and right AICA. No branch occlusion, beading, stenosis, or aneurysm. MRA NECK FINDINGS Time-of-flight neck MRA shows antegrade flow in the bilateral carotid and vertebral circulation. There is no beading, stenosis, or ulceration. 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 There is apparent narrowing at the proximal left vertebral artery, favored artifactual based on source images. IMPRESSION: Brain MRI: Negative.  No infarct or explanation for symptoms.  Intracranial MRA: Variant circle-of-Willis anatomy.  No pathologic finding. Neck MRA: Narrow appearance of the proximal left vertebral artery is favored artifactual. Otherwise negative. Electronically Signed   By: Monte Fantasia M.D.   On: 10/24/2017 09:16   Mr Jodene Nam Head Wo Contrast  Result Date: 10/24/2017 CLINICAL DATA:  Stroke follow-up. History of VSD. Left upper extremity weakness and slurred speech with left facial droop. Symptoms subsequently resolved EXAM: MR HEAD WITHOUT CONTRAST MR CIRCLE OF WILLIS WITHOUT CONTRAST MRA OF THE NECK WITHOUT AND WITH CONTRAST TECHNIQUE: Multiplanar, multiecho pulse sequences of the brain, circle of willis and surrounding structures were obtained without intravenous contrast. Angiographic images of the neck were obtained using MRA technique without and with intravenous contrast. CONTRAST:  86mL MULTIHANCE GADOBENATE DIMEGLUMINE 529 MG/ML IV SOLN COMPARISON:  Head CT from earlier today FINDINGS: MR HEAD FINDINGS Brain: No acute infarction, hemorrhage, hydrocephalus, extra-axial collection or mass lesion. Vascular: Arterial findings below. 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 Codominant vertebral arteries. Dominant left PICA and right AICA. No branch occlusion, beading, stenosis, or aneurysm. MRA NECK FINDINGS Time-of-flight neck MRA shows  antegrade flow in the bilateral carotid and vertebral circulation. There is no beading, stenosis, or ulceration. 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 There is apparent narrowing at the proximal left vertebral artery, favored artifactual based on source images. IMPRESSION: Brain MRI: Negative.  No infarct or explanation for symptoms. Intracranial MRA: Variant  circle-of-Willis anatomy.  No pathologic finding. Neck MRA: Narrow appearance of the proximal left vertebral artery is favored artifactual. Otherwise negative. Electronically Signed   By: Monte Fantasia M.D.   On: 10/24/2017 09:16   Ct Head Code Stroke Wo Contrast  Result Date: 10/24/2017 CLINICAL DATA:  Code stroke. Initial evaluation for left leg and arm droop. EXAM: CT HEAD WITHOUT CONTRAST TECHNIQUE: Contiguous axial images were obtained from the base of the skull through the vertex without intravenous contrast. COMPARISON:  Prior MRI from 11/21/2011. FINDINGS: Brain: Age-appropriate cerebral atrophy. Mild chronic small vessel ischemic disease. Probable small remote lacunar infarct noted within the right thalamus. No acute large vessel territory infarct. No acute intracranial hemorrhage. No mass lesion, midline shift or mass effect. No hydrocephalus. No extra-axial fluid collection. Vascular: No hyperdense vessel. Skull: Scalp soft tissues and calvarium within normal limits. Sinuses/Orbits: Globes and orbital soft tissues within normal limits. Paranasal sinuses mastoid air cells are clear. Other: None. ASPECTS [facility] Stroke Program Early CT Score) - Ganglionic level infarction (caudate, lentiform nuclei, internal capsule, insula, M1-M3 cortex): 7 - Supraganglionic infarction (M4-M6 cortex): 3 Total score (0-10 with 10 being normal): 10 IMPRESSION: 1. No acute intracranial infarct or other process identified. 2. ASPECTS is 10. 3. 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 These results were communicated to Rory Percy at 12:25 amon 4/30/2019by text page via the [facility] messaging system. Electronically Signed   By: Jeannine Boga M.D.   On: 10/24/2017 00:29    Physical Exam:    Obese middle-aged [place] American lady currently not in distress. . Afebrile. Head is nontraumatic. Neck is supple without bruit.    Cardiac exam harsh holosystolic murmur  heard all over the precordium with loud second heart sound or gallop. Lungs are clear to auscultation. Distal pulses are well felt. Neurological Exam ;  Awake  Alert oriented x 3. Normal speech and language.eye movements full without nystagmus.fundi were not visualized. Vision acuity and fields appear normal. Hearing is normal. Palatal movements are normal. Face symmetric. Tongue midline. Normal strength, tone, reflexes and coordination. Normal sensation. Gait deferred.  ASSESSMENT Ms. Abuk Selleck is a 47 y.o. female with transient episode of left upper is a weakness likely due to right brain TIA. History of similar episode affecting the right upper extremity a month ago. Etiology probably small vessel disease but given her history of ventriculoseptal defect with pulmonary hypertension possibility of right-to-left shunt andparadoxical embolism is also a possibility. Vascular risk factors of diabetes, hypertension, obesity Hospital day # 0  TREATMENT/PLAN continuedual antiplatelet therapy of aspirin and Plavix for 3 weeks followed by aspirin alone. Check lower extremity venous Doppler for DVT. Check echocardiogram and carotid Dopplers. Add statin for mildly elevated lipids. Physical occupational therapy consults. Long discussion of the bedside with the patient and family membersand answered questions about her plan of care and treatment. Greater than 50% time during this 25 minute visit was spent on counseling and coordination of care about TIA and discussion about stroke risk factors and treatment and answered questions.discussed with Dr. Marthenia Rolling.  Antony Contras, MD [facility] Pager: [phone] 10/25/2017 1:51 PM

## 2017-10-25 NOTE — Progress Notes (Signed)
Echocardiogram 2D Echocardiogram has been performed.  10/25/2017 2:35 PM Maudry Mayhew, BS, RVT, RDCS, RDMS

## 2017-10-25 NOTE — Progress Notes (Signed)
PROGRESS NOTE    Cyniah Gossard  VPX:[id number] DOB: Nov 05, 1970 DOA: 10/23/2017 PCP: Clent Demark, PA-C  Outpatient Specialists:   Brief Narrative: Patient is a 47 year old African-American female with past medical history significant for VSD being followed at [facility], hypertension, pulmonary hypertension, diabetes mellitus type 2.  Patient presented with left upper extremity weakness, left facial droop, and slurred speech.  Patient is currently being worked up for TIA/acute CVA.  Patient is on EC aspirin 325 mg p.o. once daily.  Echo is pending.  Due to VSD, Doppler ultrasound of the lower extremities has been ordered as well.  Fasting lipid profile and A1c are noted.  Neurology team input is highly appreciated.  10/25/2017: TIA/CVA work-up is still in progress.  Echocardiogram, Doppler ultrasound of lower extremity and carotid Doppler ultrasound results are still pending.  Left-sided hemiparesis persists.  No worsening of symptoms.  Patient is more communicative today.  Assessment & Plan:   Principal Problem:   TIA (transient ischemic attack) Active Problems:   Essential hypertension   Diabetes mellitus ([place])   VSD (ventricular septal defect), perimembranous   Pulmonary hypertension, primary ([place])   Transient ischemic attack/CVA:  -Continue aspirin. -Neurology input is appreciated.  Complete work-up was recommended by the neurology team. -MRI/MRA of the brain and MRA of the neck have not revealed any significant finding. -Foley echocardiogram. -LDL is in the 80s. -Follow results of Doppler ultrasound of lower extremities. -Further management will depend on hospital course. -10/25/2017: Complete stroke work-up.  Continue aspirin and Plavix (for 3 weeks) as per neurology team recommendation.  Likely discharge in the morning.  Physical therapy input is appreciated.  Hypertension: - Allow for permissive hypertension.  Diabetes mellitus type 2: -A1c is 5.5%. -Continue to  optimize.  Microcytic hypochromic anemia: -Suspect related to iron deficiency. -Further work-up by patient's PCP.  History of VSD and pulmonary hypertension -Followed up cardiologist at [facility] on discharge. -Discussed with local cardiology team, patient's VSD can be followed up on outpatient basis.   DVT prophylaxis: Lovenox. Code Status: Full code. Family Communication: Discussed with patient. Disposition Plan: Home. Consults called: Neurologist. Admission status: Observation   Procedures:   None  Antimicrobials:   None   Subjective: Patient still has left-sided weakness.  Patient is more communicative today.  Objective: Vitals:   10/25/17 0000 10/25/17 0500 10/25/17 0818 10/25/17 1009  BP: 108/64 101/69 124/66 122/72  Pulse: 75 78 75 84  Resp: 18 18 18    Temp: 98.3 F (36.8 C) 97.8 F (36.6 C) 97.9 F (36.6 C)   TempSrc: Oral Oral Oral   SpO2: 99% 100% 100%   Weight:      Height:        Intake/Output Summary (Last 24 hours) at 10/25/2017 1609 Last data filed at 10/25/2017 1500 Gross per 24 hour  Intake 654 ml  Output -  Net 654 ml   Filed Weights   10/24/17 0200  Weight: 75.5 kg (166 lb 7.2 oz)    Examination:  General exam: Appears calm and comfortable.   Respiratory system: Clear to auscultation. Cardiovascular system: S1 & S2, heart murmur (machinery)  Gastrointestinal system: Abdomen is nondistended, soft and nontender. No organomegaly or masses felt. Normal bowel sounds heard. Central nervous system: Alert and oriented.  Left-sided weakness.  Power in left upper and left lower extremities is 4 out of 5.   Extremities: No leg edema.  Data Reviewed: I have personally reviewed following labs and imaging studies  CBC: Recent  Labs  Lab 10/23/17 2352 10/23/17 2358 10/24/17 0305  WBC 6.4  --  6.1  NEUTROABS 4.0  --   --   HGB 11.0* 11.9* 10.9*  HCT 35.1* 35.0* 34.8*  MCV 75.2*  --  75.5*  PLT 212  --  454   Basic  Metabolic Panel: Recent Labs  Lab 10/23/17 2352 10/23/17 2358 10/24/17 0305  NA 137 140  --   K 3.7 3.7  --   CL 107 104  --   CO2 25  --   --   GLUCOSE 130* 126*  --   BUN 5* 4*  --   CREATININE 0.73 0.70 0.74  CALCIUM 8.9  --   --    GFR: Estimated Creatinine Clearance: 78.6 mL/min (by C-G formula based on SCr of 0.74 mg/dL). Liver Function Tests: Recent Labs  Lab 10/23/17 2352  AST 25  ALT 23  ALKPHOS 54  BILITOT 0.6  PROT 7.1  ALBUMIN 3.9   No results for input(s): LIPASE, AMYLASE in the last 168 hours. No results for input(s): AMMONIA in the last 168 hours. Coagulation Profile: Recent Labs  Lab 10/23/17 2352  INR 0.99   Cardiac Enzymes: No results for input(s): CKTOTAL, CKMB, CKMBINDEX, TROPONINI in the last 168 hours. BNP (last 3 results) No results for input(s): PROBNP in the last 8760 hours. HbA1C: Recent Labs    10/24/17 0305  HGBA1C 5.5   CBG: Recent Labs  Lab 10/24/17 1217 10/24/17 1629 10/24/17 2152 10/25/17 0614 10/25/17 1039  GLUCAP 134* 93 98 116* 125*   Lipid Profile: Recent Labs    10/24/17 0305  CHOL 145  HDL 45  LDLCALC 83  TRIG 87  CHOLHDL 3.2   Thyroid Function Tests: No results for input(s): TSH, T4TOTAL, FREET4, T3FREE, THYROIDAB in the last 72 hours. Anemia Panel: No results for input(s): VITAMINB12, FOLATE, FERRITIN, TIBC, IRON, RETICCTPCT in the last 72 hours. Urine analysis:    Component Value Date/Time   COLORURINE YELLOW 03/31/2016 1433   APPEARANCEUR CLOUDY (A) 03/31/2016 1433   LABSPEC 1.031 (H) 03/31/2016 1433   PHURINE 5.5 03/31/2016 1433   GLUCOSEU NEGATIVE 03/31/2016 1433   HGBUR NEGATIVE 03/31/2016 1433   BILIRUBINUR small 11/14/2016 1536   KETONESUR NEGATIVE 03/31/2016 1433   PROTEINUR 30 mg/dl 11/14/2016 1536   PROTEINUR NEGATIVE 03/31/2016 1433   UROBILINOGEN 0.2 11/14/2016 1536   UROBILINOGEN 0.2 09/14/2014 1024   NITRITE negative 11/14/2016 1536   NITRITE NEGATIVE 03/31/2016 1433    LEUKOCYTESUR Negative 11/14/2016 1536   Sepsis Labs: @LABRCNTIP (procalcitonin:4,lacticidven:4)  )No results found for this or any previous visit (from the past 240 hour(s)).       Radiology Studies: Mr Angiogram Neck W Or Wo Contrast  Result Date: 10/24/2017 CLINICAL DATA:  Stroke follow-up. History of VSD. Left upper extremity weakness and slurred speech with left facial droop. Symptoms subsequently resolved EXAM: MR HEAD WITHOUT CONTRAST MR CIRCLE OF WILLIS WITHOUT CONTRAST MRA OF THE NECK WITHOUT AND WITH CONTRAST TECHNIQUE: Multiplanar, multiecho pulse sequences of the brain, circle of willis and surrounding structures were obtained without intravenous contrast. Angiographic images of the neck were obtained using MRA technique without and with intravenous contrast. CONTRAST:  63mL MULTIHANCE GADOBENATE DIMEGLUMINE 529 MG/ML IV SOLN COMPARISON:  Head CT from earlier today FINDINGS: MR HEAD FINDINGS Brain: No acute infarction, hemorrhage, hydrocephalus, extra-axial collection or mass lesion. Vascular: Arterial findings below. Normal dural venous sinus flow voids Skull and upper cervical spine: Negative for marrow lesion Sinuses/Orbits: Negative MR CIRCLE  OF WILLIS FINDINGS The right ICA is larger than the left in the setting of large right posterior communicating artery and aplastic left A1 segment. Codominant vertebral arteries. Dominant left PICA and right AICA. No branch occlusion, beading, stenosis, or aneurysm. MRA NECK FINDINGS Time-of-flight neck MRA shows antegrade flow in the bilateral carotid and vertebral circulation. There is no beading, stenosis, or ulceration. 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 There is apparent narrowing at the proximal left vertebral artery, favored artifactual based on source images. IMPRESSION: Brain MRI: Negative.  No infarct or explanation for symptoms. Intracranial MRA: Variant circle-of-Willis anatomy.  No pathologic finding. Neck  MRA: Narrow appearance of the proximal left vertebral artery is favored artifactual. Otherwise negative. Electronically Signed   By: Monte Fantasia M.D.   On: 10/24/2017 09:16   Mr Brain Wo Contrast  Result Date: 10/24/2017 CLINICAL DATA:  Stroke follow-up. History of VSD. Left upper extremity weakness and slurred speech with left facial droop. Symptoms subsequently resolved EXAM: MR HEAD WITHOUT CONTRAST MR CIRCLE OF WILLIS WITHOUT CONTRAST MRA OF THE NECK WITHOUT AND WITH CONTRAST TECHNIQUE: Multiplanar, multiecho pulse sequences of the brain, circle of willis and surrounding structures were obtained without intravenous contrast. Angiographic images of the neck were obtained using MRA technique without and with intravenous contrast. CONTRAST:  77mL MULTIHANCE GADOBENATE DIMEGLUMINE 529 MG/ML IV SOLN COMPARISON:  Head CT from earlier today FINDINGS: MR HEAD FINDINGS Brain: No acute infarction, hemorrhage, hydrocephalus, extra-axial collection or mass lesion. Vascular: Arterial findings below. 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 Codominant vertebral arteries. Dominant left PICA and right AICA. No branch occlusion, beading, stenosis, or aneurysm. MRA NECK FINDINGS Time-of-flight neck MRA shows antegrade flow in the bilateral carotid and vertebral circulation. There is no beading, stenosis, or ulceration. 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 There is apparent narrowing at the proximal left vertebral artery, favored artifactual based on source images. IMPRESSION: Brain MRI: Negative.  No infarct or explanation for symptoms. Intracranial MRA: Variant circle-of-Willis anatomy.  No pathologic finding. Neck MRA: Narrow appearance of the proximal left  vertebral artery is favored artifactual. Otherwise negative. Electronically Signed   By: Monte Fantasia M.D.   On: 10/24/2017 09:16   Mr Jodene Nam Head Wo Contrast  Result Date: 10/24/2017 CLINICAL DATA:  Stroke follow-up. History of VSD. Left upper extremity weakness and slurred speech with left facial droop. Symptoms subsequently resolved EXAM: MR HEAD WITHOUT CONTRAST MR CIRCLE OF WILLIS WITHOUT CONTRAST MRA OF THE NECK WITHOUT AND WITH CONTRAST TECHNIQUE: Multiplanar, multiecho pulse sequences of the brain, circle of willis and surrounding structures were obtained without intravenous contrast. Angiographic images of the neck were obtained using MRA technique without and with intravenous contrast. CONTRAST:  65mL MULTIHANCE GADOBENATE DIMEGLUMINE 529 MG/ML IV SOLN COMPARISON:  Head CT from earlier today FINDINGS: MR HEAD FINDINGS Brain: No acute infarction, hemorrhage, hydrocephalus, extra-axial collection or mass lesion. Vascular: Arterial findings below. 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 Codominant vertebral arteries. Dominant left PICA and right AICA. No branch occlusion, beading, stenosis, or aneurysm.  MRA NECK FINDINGS Time-of-flight neck MRA shows antegrade flow in the bilateral carotid and vertebral circulation. There is no beading, stenosis, or ulceration. 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 There is apparent narrowing at the proximal left vertebral artery, favored artifactual based on source images. IMPRESSION: Brain MRI: Negative.  No infarct or explanation for symptoms. Intracranial MRA: Variant circle-of-Willis anatomy.  No pathologic finding. Neck MRA: Narrow appearance of the proximal left vertebral artery is favored artifactual.  Otherwise negative. Electronically Signed   By: Monte Fantasia M.D.   On: 10/24/2017 09:16   Ct Head Code Stroke Wo Contrast  Result Date: 10/24/2017 CLINICAL DATA:  Code stroke. Initial evaluation for left leg and arm droop. EXAM: CT HEAD WITHOUT CONTRAST TECHNIQUE: Contiguous axial images were obtained from the base of the skull through the vertex without intravenous contrast. COMPARISON:  Prior MRI from 11/21/2011. FINDINGS: Brain: Age-appropriate cerebral atrophy. Mild chronic small vessel ischemic disease. Probable small remote lacunar infarct noted within the right thalamus. No acute large vessel territory infarct. No acute intracranial hemorrhage. No mass lesion, midline shift or mass effect. No hydrocephalus. No extra-axial fluid collection. Vascular: No hyperdense vessel. Skull: Scalp soft tissues and calvarium within normal limits. Sinuses/Orbits: Globes and orbital soft tissues within normal limits. Paranasal sinuses mastoid air cells are clear. Other: None. ASPECTS [facility] Stroke Program Early CT Score) - Ganglionic level infarction (caudate, lentiform nuclei, internal capsule, insula, M1-M3 cortex): 7 - Supraganglionic infarction (M4-M6 cortex): 3 Total score (0-10 with 10 being normal): 10 IMPRESSION: 1. No acute intracranial infarct or other process identified. 2. ASPECTS is 10. 3. 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 These results were communicated to Rory Percy at 12:25 amon 4/30/2019by text page via the [facility] messaging system. Electronically Signed   By: Jeannine Boga M.D.   On: 10/24/2017 00:29        Scheduled Meds: .  stroke: mapping our early stages of recovery book   Does not apply Once  . amLODipine  5 mg Oral Daily  . aspirin EC  325 mg Oral Daily  . atorvastatin  80 mg Oral q1800  . carvedilol  12.5 mg Oral BID WC  . clopidogrel  75 mg Oral Daily  . enoxaparin (LOVENOX) injection  40 mg Subcutaneous Q24H    . insulin aspart  0-9 Units Subcutaneous TID WC   Continuous Infusions: . sodium chloride 10 mL/hr at 10/24/17 1037     LOS: 0 days    Time spent: 25 minutes    Dana Allan, MD  [facility] Pager #: [phone] 7PM-7AM contact night coverage as above

## 2017-10-25 NOTE — Progress Notes (Signed)
Physical Therapy Treatment Patient Details Name: Mackenzie Patterson MRN: [id number] DOB: 1970/11/26 Today's Date: 10/25/2017    History of Present Illness Pt is a 47 y/o female admitted secondary to LUE weakness. MRI and CT negative for acute abnormality. PMH includes HTN, DM, pulmonary HTN, VSD, and asthma.     PT Comments    Patient is making progress toward PT goals and tolerated session well. Pt overall mod I/Supervision for OOB mobility including stair negotiation. Continue to progress as tolerated. Current plan remains appropriate.    Follow Up Recommendations  No PT follow up;Supervision - Intermittent     Equipment Recommendations  Cane    Recommendations for Other Services       Precautions / Restrictions Precautions Precautions: None Restrictions Weight Bearing Restrictions: No    Mobility  Bed Mobility Overal bed mobility: Independent                Transfers Overall transfer level: Modified independent Equipment used: None Transfers: Sit to/from Stand           General transfer comment: use of SPC upon standing  Ambulation/Gait Ambulation/Gait assistance: Supervision Ambulation Distance (Feet): 250 Feet Assistive device: Straight cane Gait Pattern/deviations: Step-through pattern;Decreased stride length Gait velocity: Decreased    General Gait Details: LOB with turning but no assistance required to recover; mild gait deviations with horizontal head turns but no LOB; cues for sequencing of gait with use of AD   Stairs Stairs: Yes Stairs assistance: Supervision Stair Management: One rail Left;Alternating pattern;Forwards Number of Stairs: 10 General stair comments: supervision for safety   Wheelchair Mobility    Modified Rankin (Stroke Patients Only)       Balance Overall balance assessment: Modified Independent Sitting-balance support: No upper extremity supported;Feet supported Sitting balance-Leahy Scale: Normal     Standing  balance support: No upper extremity supported;During functional activity Standing balance-Leahy Scale: Good                              Cognition Arousal/Alertness: Awake/alert Behavior During Therapy: Flat affect Overall Cognitive Status: Within Functional Limits for tasks assessed                                        Exercises      General Comments        Pertinent Vitals/Pain Pain Assessment: No/denies pain    Home Living     Available Help at Discharge: Family;Available PRN/intermittently Type of Home: House              Prior Function            PT Goals (current goals can now be found in the care plan section) Acute Rehab PT Goals Patient Stated Goal: to go home  PT Goal Formulation: With patient Time For Goal Achievement: 11/07/17 Potential to Achieve Goals: Good Progress towards PT goals: Progressing toward goals    Frequency    Min 3X/week      PT Plan Current plan remains appropriate    Co-evaluation              AM-PAC PT "6 Clicks" Daily Activity  Outcome Measure  Difficulty turning over in bed (including adjusting bedclothes, sheets and blankets)?: None Difficulty moving from lying on back to sitting on the side of the bed? : A Little Difficulty sitting down  on and standing up from a chair with arms (e.g., wheelchair, bedside commode, etc,.)?: A Little Help needed moving to and from a bed to chair (including a wheelchair)?: A Little Help needed walking in hospital room?: A Little Help needed climbing 3-5 steps with a railing? : A Little 6 Click Score: 19    End of Session Equipment Utilized During Treatment: Gait belt Activity Tolerance: Patient tolerated treatment well Patient left: in bed;with call bell/phone within reach Nurse Communication: Mobility status PT Visit Diagnosis: Unsteadiness on feet (R26.81)     Time: 7948-0165 PT Time Calculation (min) (ACUTE ONLY): 16 min  Charges:   $Gait Training: 8-22 mins                    G Codes:       Mackenzie Patterson, PTA Pager: [phone]     Darliss Cheney 10/25/2017, 9:25 AM

## 2017-10-26 DIAGNOSIS — G459 Transient cerebral ischemic attack, unspecified: Secondary | ICD-10-CM | POA: Diagnosis not present

## 2017-10-26 LAB — GLUCOSE, CAPILLARY
GLUCOSE-CAPILLARY: 105 mg/dL — AB (ref 65–99)
Glucose-Capillary: 116 mg/dL — ABNORMAL HIGH (ref 65–99)

## 2017-10-26 MED ORDER — ATORVASTATIN CALCIUM 80 MG PO TABS: 80.0000 mg | ORAL_TABLET | Freq: Every day | ORAL | 0 refills | Status: DC

## 2017-10-26 MED ORDER — CLOPIDOGREL BISULFATE 75 MG PO TABS: 75.0000 mg | ORAL_TABLET | Freq: Every day | ORAL | 0 refills | Status: AC

## 2017-10-26 MED ORDER — ASPIRIN 325 MG PO TBEC: 325.0000 mg | DELAYED_RELEASE_TABLET | Freq: Every day | ORAL | 0 refills | Status: DC

## 2017-10-26 NOTE — Progress Notes (Signed)
Stroke Team Progress Note        SUBJECTIVE There are multiple family members at the bedside. Patient is doing well without recurrent stroke or TIA symptoms. Lower extremity venous Dopplers are negative for DVT and echocardiogram confirmed ventricular septal defect with pulmonary hypertension OBJECTIVE Most recent Vital Signs: Temp: 98.6 F (37 C) (05/02 1200) Temp Source: Oral (05/02 1200) BP: 122/68 (05/02 1200) Pulse Rate: 78 (05/02 1200) Respiratory Rate: 18 O2 Saturdation: 100%  CBG (last 3)  Recent Labs    10/25/17 2145 10/26/17 0613 10/26/17 1200  GLUCAP 137* 116* 105*    Diet:  Diet Order           Diet - low sodium heart healthy        Diet Carb Modified Fluid consistency: Thin; Room service appropriate? Yes  Diet effective now           Activity: Up with assistance   VTE Prophylaxis:  Not needed  Studies: Results for orders placed or performed during the hospital encounter of 10/23/17 (from the past 24 hour(s))  Glucose, capillary     Status: Abnormal   Collection Time: 10/25/17  4:34 PM  Result Value Ref Range   Glucose-Capillary 119 (H) 65 - 99 mg/dL  Glucose, capillary     Status: Abnormal   Collection Time: 10/25/17  9:45 PM  Result Value Ref Range   Glucose-Capillary 137 (H) 65 - 99 mg/dL   Comment 1 Notify RN    Comment 2 Document in Chart   Glucose, capillary     Status: Abnormal   Collection Time: 10/26/17  6:13 AM  Result Value Ref Range   Glucose-Capillary 116 (H) 65 - 99 mg/dL   Comment 1 Notify RN    Comment 2 Document in Chart   Glucose, capillary     Status: Abnormal   Collection Time: 10/26/17 12:00 PM  Result Value Ref Range   Glucose-Capillary 105 (H) 65 - 99 mg/dL     No results found.  Physical Exam:    Obese middle-aged [place] American lady currently not in distress. . Afebrile. Head is nontraumatic. Neck is supple without bruit.    Cardiac exam harsh holosystolic murmur heard all over the precordium with loud second  heart sound or gallop. Lungs are clear to auscultation. Distal pulses are well felt. Neurological Exam ;  Awake  Alert oriented x 3. Normal speech and language.eye movements full without nystagmus.fundi were not visualized. Vision acuity and fields appear normal. Hearing is normal. Palatal movements are normal. Face symmetric. Tongue midline. Normal strength, tone, reflexes and coordination. Normal sensation. Gait deferred.  ASSESSMENT Mackenzie Patterson is a 47 y.o. female with transient episode of left upper is a weakness likely due to right brain TIA. History of similar episode affecting the right upper extremity a month ago. Etiology probably small vessel disease .She has history of ventriculoseptal defect with pulmonary hypertension  Vascular risk factors of diabetes, hypertension, obesity Hospital day # 0  TREATMENT/PLAN Continue dual antiplatelet therapy of aspirin and Plavix for 3 weeks followed by aspirin alone.  Continue statin for mildly elevated lipids. Physical occupational therapy consults. Long discussion of the bedside with the patient and family membersand answered questions about her plan of care and treatment. Greater than 50% time during this 25 minute visit was spent on counseling and coordination of care about TIA and discussion about stroke risk factors and treatment and answered questions.follow-up as an outpatient in the stroke clinic in 6 weeks. Follow-up  with her cardiologist Dr. Daneen Schick for ventricular septal defect and pulmonary hypertension.Stroke team will sign off. Kindly call for questions.discussed with Dr. Marthenia Rolling.  Antony Contras, MD [facility] Pager: [phone] 10/26/2017 4:23 PM

## 2017-10-26 NOTE — Discharge Summary (Signed)
Physician Discharge Summary  [name] MRN: [id number] DOB/AGE: September 07, 1970 47 y.o.  Admit date: 10/23/2017 Discharge date: 10/26/2017  Admission Diagnoses:  Discharge Diagnoses:  Principal Problem:   TIA (transient ischemic attack) Active Problems:   Essential hypertension   Diabetes mellitus ([place])   VSD (ventricular septal defect), perimembranous   Pulmonary hypertension, primary ([place])   Discharged Condition: stable  Hospital Course: Patient is a 47 year old African-American female with past medical history significant for VSD being followed at [facility], hypertension, pulmonary hypertension, diabetes mellitus type 2.  Patient presented with left sided weakness, left facial droop, and slurred speech.  Patient was admitted for further assessment and management of acute TIA.  Neurology team was consulted.  Essentially, the neurology team directed patient's care.   MRI of the brain did not reveal any acute infarct, MRA of the brain did not reveal any pathological findings, and the MRA of the neck did not reveal any significant finding except narrow appearance of the proximal left vertebral artery that is favored to be artifactual.  Echocardiogram findings as documented below.  Patient was managed with aspirin and Plavix.  The plan is to discontinue Plavix after 3 weeks.  Patient will follow-up with PCP, neurology and the cardiology team at [facility] on discharge.  Patient has been optimized.  Transient ischemic attack/CVA:This was worked up as above.  LDL was in the 80s.  Patient will be discharged back home on aspirin and Plavix.  Patient will only take Plavix for 3 weeks.  Patient will follow with neurology team and cardiology team on discharge.  Patient's cardiology teamDr. Jarrett Soho.  Hypertension: -Allowed for permissive hypertension.  Diabetes mellitus type 2: -A1c is 5.5%. -Blood sugar was optimized during the hospital stay.    Microcytic  hypochromic anemia: -Suspect related to iron deficiency. -Further work-up by patient's PCP.  History of VSD and pulmonary hypertension -Followed up cardiologist at [facility] on discharge. -Discussed with local cardiology team, patient's VSD can be followed up on outpatient basis.  Consults: neurology  Significant Diagnostic Studies: radiology:  MRI brain: No significant finding.  MRA brain: No significant finding.  MRA neck: No significant pathology (kindly see above)  Echo: "There are conflicting findings regarding the severity of the VSD   shunt. Shunt velocities are very high, suggesting a small   restrictive defect. Calculated shunt fraction is 2:1 and the RV   systolic pressure appears to be markedly elevated, consistent   with a large defect. Further evaluation is indiacted (consider   TEE and right and left heart catheterization, as clinically   appropriate)".  Doppler ultrasound of bilateral lower extremities: No DVTs.    Discharge Exam: Blood pressure 122/68, pulse 78, temperature 98.6 F (37 C), temperature source Oral, resp. rate 18, height '4\' 11"'  (1.499 m), weight 75.5 kg (166 lb 7.2 oz), SpO2 100 %.   Disposition: Discharge disposition: 01-Home or Self Care       Discharge Instructions    Call MD for:   Complete by:  As directed    Call MD with worsening of symptoms   Diet - low sodium heart healthy   Complete by:  As directed    Increase activity slowly   Complete by:  As directed      Allergies as of 10/26/2017      Reactions   Penicillins Anaphylaxis   Has patient had a PCN reaction causing immediate rash, facial/tongue/throat swelling, SOB or lightheadedness with hypotension: Yes Has patient had  a PCN reaction causing severe rash involving mucus membranes or skin necrosis: Yes Has patient had a PCN reaction that required hospitalization Yes- went to ED, was not admitted Has patient had a PCN reaction occurring within  the last 10 years: No- longer then 10 years If all of the above answers are "NO", then may proceed with Cephalosporin use.   Tape Rash   Other reaction(s): Other (See Comments) Burns skin   Other Rash   Other reaction(s): Other (See Comments) **EKG GEL PADS**  "Burns my skin" Pt states she is allergic to a blood pressure medication, unsure.  **EKG GEL PADS**  "Burns my skin"   Aspirin Other (See Comments)   Burns stomach    Latex Rash      Medication List    STOP taking these medications   acetaminophen 500 MG tablet Commonly known as:  TYLENOL   amLODipine 5 MG tablet Commonly known as:  NORVASC   carvedilol 12.5 MG tablet Commonly known as:  COREG   EXCEDRIN PM PO     TAKE these medications   ACCU-CHEK AVIVA PLUS w/Device Kit 1 kit by Does not apply route daily.   aspirin 325 MG EC tablet Take 1 tablet (325 mg total) by mouth daily. Start taking on:  10/27/2017   atorvastatin 80 MG tablet Commonly known as:  LIPITOR Take 1 tablet (80 mg total) by mouth daily at 6 PM.   clopidogrel 75 MG tablet Commonly known as:  PLAVIX Take 1 tablet (75 mg total) by mouth daily for 21 days. Start taking on:  10/27/2017   sitaGLIPtin-metformin 50-1000 MG tablet Commonly known as:  JANUMET Take 1 tablet by mouth 2 (two) times daily with a meal.            Durable Medical Equipment  (From admission, onward)        Start     Ordered   10/25/17 1222  For home use only DME Cane  Once     10/25/17 1222       Signed: Bonnell Public 10/26/2017, 2:21 PM

## 2017-11-02 ENCOUNTER — Inpatient Hospital Stay (INDEPENDENT_AMBULATORY_CARE_PROVIDER_SITE_OTHER): Payer: Medicaid Other | Admitting: Nurse Practitioner

## 2017-12-05 ENCOUNTER — Ambulatory Visit (INDEPENDENT_AMBULATORY_CARE_PROVIDER_SITE_OTHER): Payer: Medicaid Other | Admitting: Physician Assistant

## 2017-12-05 ENCOUNTER — Other Ambulatory Visit: Payer: Self-pay

## 2017-12-05 ENCOUNTER — Encounter (INDEPENDENT_AMBULATORY_CARE_PROVIDER_SITE_OTHER): Payer: Self-pay | Admitting: Physician Assistant

## 2017-12-05 VITALS — BP 128/82 | HR 88 | Temp 98.1°F | Ht 59.0 in | Wt 163.8 lb

## 2017-12-05 DIAGNOSIS — J069 Acute upper respiratory infection, unspecified: Secondary | ICD-10-CM

## 2017-12-05 DIAGNOSIS — Z09 Encounter for follow-up examination after completed treatment for conditions other than malignant neoplasm: Secondary | ICD-10-CM | POA: Diagnosis not present

## 2017-12-05 MED ORDER — DM-APAP-CPM 15-500-2 MG PO TABS
2.0000 | ORAL_TABLET | Freq: Four times a day (QID) | ORAL | 0 refills | Status: AC
Start: 2017-12-05 — End: 2017-12-10

## 2017-12-05 NOTE — Progress Notes (Signed)
Subjective:  [name], female    DOB: April 25, 1971  Age: 47 y.o. MRN: [id number]  CC: TIA  HPI Mackenzie Patterson a 47 y.o.femalewith a medical history of asthma, congenital VSD (managed by [facility]), DM2, heart murmur, HTN, obesity, pulmonary HTN, and tricuspid regurgitation presentsfor ED f/u.  ED visit on 10/23/17 for left sided weakness, left facial droop, and slurred speech. MRI of the brain did not reveal any acute infarct, MRA of the brain did not reveal any pathological findings, and the MRA of the neck did not reveal any significant finding except narrow appearance of the proximal left vertebral artery that is favored to be artifactual. Patient was advised to follow-up with PCP, neurology and the cardiology team at [facility] on discharge. Took Plavix for three weeks. Currently on aspirin 325 mg. Says the symptoms of the TIA are resolved.     Main complaint today is of a cold. Onset since four days after standing in the rain at her niece's graduation. No close contacts with the same. Sore throat, cough mostly at night, sweats, SOB, bitemporal headache, chest pressure, nasal congestion, fatigue, and hoarse voice. Denies fever, chills, nausea, vomiting, body aches, rash, abdominal pain, or GI/GU sxs. Took some cough syrup twice and felt no relief of symptoms.     Outpatient Medications Prior to Visit  Medication Sig Dispense Refill  . aspirin EC 325 MG EC tablet Take 1 tablet (325 mg total) by mouth daily. 30 tablet 0  . atorvastatin (LIPITOR) 80 MG tablet Take 1 tablet (80 mg total) by mouth daily at 6 PM. 30 tablet 0  . Blood Glucose Monitoring Suppl (ACCU-CHEK AVIVA PLUS) w/Device KIT 1 kit by Does not apply route daily. 1 kit 0  . sitaGLIPtin-metformin (JANUMET) 50-1000 MG tablet Take 1 tablet by mouth 2 (two) times daily with a meal. 60 tablet 11   No facility-administered medications prior to visit.      ROS Review of Systems  Constitutional: Negative  for chills, fever and malaise/fatigue.  Eyes: Negative for blurred vision.  Respiratory: Negative for shortness of breath.   Cardiovascular: Negative for chest pain and palpitations.  Gastrointestinal: Negative for abdominal pain and nausea.  Genitourinary: Negative for dysuria and hematuria.  Musculoskeletal: Negative for joint pain and myalgias.  Skin: Negative for rash.  Neurological: Negative for tingling and headaches.  Psychiatric/Behavioral: Negative for depression. The patient is not nervous/anxious.     Objective:  BP 128/82 (BP Location: Left Arm, Patient Position: Sitting, Cuff Size: Normal)   Pulse 88   Temp 98.1 F (36.7 C) (Oral)   Ht _0  (1.499 m)   Wt 163 lb 12.8 oz (74.3 kg)   LMP 11/21/2017 (Exact Date)   SpO2 98%   BMI 33.08 kg/m   BP/Weight 12/05/2017 10/26/2017 07/02/2692  Systolic BP 854 627 -  Diastolic BP 82 68 -  Wt. (Lbs) 163.8 - 166.45  BMI 33.08 - 33.62      Physical Exam  Constitutional: She is oriented to person, place, and time.  Well developed, well nourished, NAD, polite  HENT:  Head: Normocephalic and atraumatic.  Mild to moderate erythema of the pharyngeal wall. Turbinates mildly hypertrophic.  Eyes: No scleral icterus.  Neck: Normal range of motion. Neck supple. No thyromegaly present.  Cardiovascular: Normal rate, regular rhythm and intact distal pulses. Exam reveals no gallop and no friction rub.  Murmur heard. No LE edema bilaterally  Pulmonary/Chest: Effort normal and breath sounds normal. No stridor. No  respiratory distress. She has no wheezes. She has no rales.  Musculoskeletal: She exhibits no edema.  Lymphadenopathy:    She has cervical adenopathy (anterior cervical chain).  Neurological: She is alert and oriented to person, place, and time.  Skin: Skin is warm and dry. No rash noted. No erythema. No pallor.  Psychiatric: She has a normal mood and affect. Her behavior is normal. Thought content normal.  Vitals  reviewed.    Assessment & Plan:   1. Acute upper respiratory infection - DM-APAP-CPM (CORICIDIN HBP FLU) 15-500-2 MG TABS; Take 2 tablets by mouth every 6 (six) hours for 5 days.  Dispense: 1 each; Refill: 0  2. Hospital discharge follow-up - TIA. Advised to continue on Aspirin. Keep appointment with neurology and cardiology.     Meds ordered this encounter  Medications  . DM-APAP-CPM (CORICIDIN HBP FLU) 15-500-2 MG TABS    Sig: Take 2 tablets by mouth every 6 (six) hours for 5 days.    Dispense:  1 each    Refill:  0    Order Specific Question:   Supervising Provider    Answer:   Charlott Rakes [[id number]]    Follow-up: Return in about 8 weeks (around 01/30/2018) for DM.   Clent Demark PA

## 2017-12-05 NOTE — Patient Instructions (Signed)
Upper Respiratory Infection, Adult Most upper respiratory infections (URIs) are caused by a virus. A URI affects the nose, throat, and upper air passages. The most common type of URI is often called "the common cold." Follow these instructions at home:  Take medicines only as told by your doctor.  Gargle warm saltwater or take cough drops to comfort your throat as told by your doctor.  Use a warm mist humidifier or inhale steam from a shower to increase air moisture. This may make it easier to breathe.  Drink enough fluid to keep your pee (urine) clear or pale yellow.  Eat soups and other clear broths.  Have a healthy diet.  Rest as needed.  Go back to work when your fever is gone or your doctor says it is okay. ? You may need to stay home longer to avoid giving your URI to others. ? You can also wear a face mask and wash your hands often to prevent spread of the virus.  Use your inhaler more if you have asthma.  Do not use any tobacco products, including cigarettes, chewing tobacco, or electronic cigarettes. If you need help quitting, ask your doctor. Contact a doctor if:  You are getting worse, not better.  Your symptoms are not helped by medicine.  You have chills.  You are getting more short of breath.  You have brown or red mucus.  You have yellow or brown discharge from your nose.  You have pain in your face, especially when you bend forward.  You have a fever.  You have puffy (swollen) neck glands.  You have pain while swallowing.  You have white areas in the back of your throat. Get help right away if:  You have very bad or constant: ? Headache. ? Ear pain. ? Pain in your forehead, behind your eyes, and over your cheekbones (sinus pain). ? Chest pain.  You have long-lasting (chronic) lung disease and any of the following: ? Wheezing. ? Long-lasting cough. ? Coughing up blood. ? A change in your usual mucus.  You have a stiff neck.  You have  changes in your: ? Vision. ? Hearing. ? Thinking. ? Mood. This information is not intended to replace advice given to you by your health care provider. Make sure you discuss any questions you have with your health care provider. Document Released: 11/30/2007 Document Revised: 02/14/2016 Document Reviewed: 09/18/2013 Elsevier Interactive Patient Education  2018 Elsevier Inc.  

## 2017-12-11 ENCOUNTER — Telehealth (INDEPENDENT_AMBULATORY_CARE_PROVIDER_SITE_OTHER): Payer: Self-pay | Admitting: Physician Assistant

## 2017-12-11 NOTE — Telephone Encounter (Signed)
Spoke with patient and explained cardiology put her on carvedilol. She would need to speak with them for the exact reasoning. Nat Christen, CMA     Patient wants to know if she would be ok to drink boost supplements, since she does not eat often. Please advise?

## 2017-12-11 NOTE — Telephone Encounter (Signed)
Pt called to request a nurse call about her BP medication, she wants to know why she has to take 2 pills. Please follow up

## 2017-12-11 NOTE — Telephone Encounter (Signed)
She may drink boost supplements.

## 2017-12-12 NOTE — Telephone Encounter (Signed)
Patient notified that she may drink boost supplements. Nat Christen, CMA

## 2018-01-16 ENCOUNTER — Other Ambulatory Visit: Payer: Self-pay

## 2018-01-16 ENCOUNTER — Ambulatory Visit (INDEPENDENT_AMBULATORY_CARE_PROVIDER_SITE_OTHER): Payer: Medicaid Other | Admitting: Physician Assistant

## 2018-01-16 ENCOUNTER — Encounter (INDEPENDENT_AMBULATORY_CARE_PROVIDER_SITE_OTHER): Payer: Self-pay | Admitting: Physician Assistant

## 2018-01-16 VITALS — BP 145/90 | HR 84 | Temp 98.5°F | Ht 59.0 in | Wt 166.8 lb

## 2018-01-16 DIAGNOSIS — E119 Type 2 diabetes mellitus without complications: Secondary | ICD-10-CM | POA: Diagnosis not present

## 2018-01-16 DIAGNOSIS — F418 Other specified anxiety disorders: Secondary | ICD-10-CM

## 2018-01-16 DIAGNOSIS — M25512 Pain in left shoulder: Secondary | ICD-10-CM

## 2018-01-16 MED ORDER — ACETAMINOPHEN 500 MG PO TABS: 1000.0000 mg | ORAL_TABLET | Freq: Three times a day (TID) | ORAL | 0 refills | Status: AC | PRN

## 2018-01-16 MED ORDER — HYDROXYZINE HCL 25 MG PO TABS: 25.0000 mg | ORAL_TABLET | Freq: Every day | ORAL | 0 refills | Status: DC

## 2018-01-16 MED ORDER — METHOCARBAMOL 500 MG PO TABS: 500.0000 mg | ORAL_TABLET | Freq: Two times a day (BID) | ORAL | 0 refills | Status: DC

## 2018-01-16 MED ORDER — ESCITALOPRAM OXALATE 10 MG PO TABS: 10.0000 mg | ORAL_TABLET | Freq: Every day | ORAL | 2 refills | Status: DC

## 2018-01-16 NOTE — Progress Notes (Signed)
Subjective:  [name], female    DOB: 02-08-1971  Age: 47 y.o. MRN: [id number]  CC: No appetite, insomnia, left shoulder pain,   HPI Mackenzie Patterson a 47 y.o.femalewith a medical history of asthma, congenital VSD (managed by [facility]), DM2, heart murmur, HTN, TIA, obesity, pulmonary HTN, and tricuspid regurgitation presents with complaint of anorexia, insomnia, and left shoulder pain along the mid clavicle. Symptoms began since two weeks ago. Pt recently had a TIA and says she constantly worries about having another stroke. The worry keeps her up at night. Says anorexia began around age 66. Simply does not have an appetite to eat all the food in front of her. She also worries about her weight. She has noticed that she is staying at home almost all the time and does not go out to visit family or friends. Pt describes anhedonia and irritability. Says she does not want to stay at home as she recognizes she is becoming more depressed. She is willing to work part time but is fearful that her disability check will be cut off.     BP noted to be elevated today. Says she did not take her antihypertensives this morning. Does    Outpatient Medications Prior to Visit  Medication Sig Dispense Refill  . ACCU-CHEK FASTCLIX LANCETS MISC USE 2 TIMES DAILY AS DIRECTED  10  . ACCU-CHEK SOFTCLIX LANCETS lancets 2 (two) times daily. As directed    . amLODipine (NORVASC) 5 MG tablet Take 5 mg by mouth daily.    Marland Kitchen aspirin EC 325 MG EC tablet Take 1 tablet (325 mg total) by mouth daily. 30 tablet 0  . atorvastatin (LIPITOR) 80 MG tablet Take 1 tablet (80 mg total) by mouth daily at 6 PM. 30 tablet 0  . Blood Glucose Monitoring Suppl (ACCU-CHEK AVIVA PLUS) w/Device KIT 1 kit by Does not apply route daily. 1 kit 0  . carvedilol (COREG) 3.125 MG tablet Take 3.125 mg by mouth 2 (two) times daily.    . sitaGLIPtin-metformin (JANUMET) 50-1000 MG tablet Take 1 tablet by mouth 2 (two) times daily with a  meal. 60 tablet 11   No facility-administered medications prior to visit.      ROS Review of Systems  Constitutional: Negative for chills, fever and malaise/fatigue.  Eyes: Negative for blurred vision.  Respiratory: Negative for shortness of breath.   Cardiovascular: Negative for chest pain and palpitations.  Gastrointestinal: Negative for abdominal pain and nausea.  Genitourinary: Negative for dysuria and hematuria.  Musculoskeletal: Negative for joint pain and myalgias.  Skin: Negative for rash.  Neurological: Negative for tingling and headaches.  Psychiatric/Behavioral: Positive for depression. The patient is nervous/anxious and has insomnia.     Objective:  Ht _0  (1.499 m)   Wt 166 lb 12.8 oz (75.7 kg)   LMP 01/12/2018 (Exact Date)   BMI 33.69 kg/m   Vitals:   01/16/18 0836  BP: (!) 145/90  Pulse: 84  Temp: 98.5 F (36.9 C)  TempSrc: Oral  SpO2: 98%  Weight: 166 lb 12.8 oz (75.7 kg)  Height: _1  (1.499 m)     Physical Exam  Constitutional: She is oriented to person, place, and time.  Well developed, well nourished, NAD, polite  HENT:  Head: Normocephalic and atraumatic.  Eyes: No scleral icterus.  Neck: Normal range of motion. Neck supple. No thyromegaly present.  Cardiovascular: Normal rate and regular rhythm. Exam reveals no gallop and no friction rub.  Murmur (3/6 holosystolic murmur)  heard. Pulmonary/Chest: Effort normal and breath sounds normal.  Musculoskeletal: She exhibits no edema.  Neurological: She is alert and oriented to person, place, and time.  Skin: Skin is warm and dry. No rash noted. No erythema. No pallor.  Psychiatric: Her behavior is normal. Thought content normal.  Depressed, constricted affect.   Vitals reviewed.    Assessment & Plan:   1. Depression with anxiety - escitalopram (LEXAPRO) 10 MG tablet; Take 1 tablet (10 mg total) by mouth daily.  Dispense: 30 tablet; Refill: 2 - hydrOXYzine (ATARAX/VISTARIL) 25 MG tablet;  Take 1 tablet (25 mg total) by mouth at bedtime.  Dispense: 30 tablet; Refill: 0  2. Acute pain of left shoulder - acetaminophen (TYLENOL) 500 MG tablet; Take 2 tablets (1,000 mg total) by mouth every 8 (eight) hours as needed for up to 7 days.  Dispense: 21 tablet; Refill: 0 - methocarbamol (ROBAXIN) 500 MG tablet; Take 1 tablet (500 mg total) by mouth 2 (two) times daily.  Dispense: 30 tablet; Refill: 0  3. Type 2 diabetes mellitus without complication, without long-term current use of insulin (HCC) - Microalbumin / creatinine urine ratio   Meds ordered this encounter  Medications  . escitalopram (LEXAPRO) 10 MG tablet    Sig: Take 1 tablet (10 mg total) by mouth daily.    Dispense:  30 tablet    Refill:  2    Order Specific Question:   Supervising Provider    Answer:   Charlott Rakes [[id number]]  . hydrOXYzine (ATARAX/VISTARIL) 25 MG tablet    Sig: Take 1 tablet (25 mg total) by mouth at bedtime.    Dispense:  30 tablet    Refill:  0    Order Specific Question:   Supervising Provider    Answer:   Charlott Rakes [[id number]]  . acetaminophen (TYLENOL) 500 MG tablet    Sig: Take 2 tablets (1,000 mg total) by mouth every 8 (eight) hours as needed for up to 7 days.    Dispense:  21 tablet    Refill:  0    Order Specific Question:   Supervising Provider    Answer:   Charlott Rakes [[id number]]  . methocarbamol (ROBAXIN) 500 MG tablet    Sig: Take 1 tablet (500 mg total) by mouth 2 (two) times daily.    Dispense:  30 tablet    Refill:  0    Order Specific Question:   Supervising Provider    Answer:   Charlott Rakes [[id number]]    Follow-up: Return in about 6 weeks (around 02/27/2018) for depression with anxiety.   Clent Demark PA

## 2018-01-16 NOTE — Patient Instructions (Signed)
Living With Depression Everyone experiences occasional disappointment, sadness, and loss in their lives. When you are feeling down, blue, or sad for at least 2 weeks in a row, it may mean that you have depression. Depression can affect your thoughts and feelings, relationships, daily activities, and physical health. It is caused by changes in the way your brain functions. If you receive a diagnosis of depression, your health care provider will tell you which type of depression you have and what treatment options are available to you. If you are living with depression, there are ways to help you recover from it and also ways to prevent it from coming back. How to cope with lifestyle changes Coping with stress Stress is your body's reaction to life changes and events, both good and bad. Stressful situations may include:  Getting married.  The death of a spouse.  Losing a job.  Retiring.  Having a baby.  Stress can last just a few hours or it can be ongoing. Stress can play a major role in depression, so it is important to learn both how to cope with stress and how to think about it differently. Talk with your health care provider or a counselor if you would like to learn more about stress reduction. He or she may suggest some stress reduction techniques, such as:  Music therapy. This can include creating music or listening to music. Choose music that you enjoy and that inspires you.  Mindfulness-based meditation. This kind of meditation can be done while sitting or walking. It involves being aware of your normal breaths, rather than trying to control your breathing.  Centering prayer. This is a kind of meditation that involves focusing on a spiritual word or phrase. Choose a word, phrase, or sacred image that is meaningful to you and that brings you peace.  Deep breathing. To do this, expand your stomach and inhale slowly through your nose. Hold your breath for 3-5 seconds, then exhale  slowly, allowing your stomach muscles to relax.  Muscle relaxation. This involves intentionally tensing muscles then relaxing them.  Choose a stress reduction technique that fits your lifestyle and personality. Stress reduction techniques take time and practice to develop. Set aside 5-15 minutes a day to do them. Therapists can offer training in these techniques. The training may be covered by some insurance plans. Other things you can do to manage stress include:  Keeping a stress diary. This can help you learn what triggers your stress and ways to control your response.  Understanding what your limits are and saying no to requests or events that lead to a schedule that is too full.  Thinking about how you respond to certain situations. You may not be able to control everything, but you can control how you react.  Adding humor to your life by watching funny films or TV shows.  Making time for activities that help you relax and not feeling guilty about spending your time this way.  Medicines Your health care provider may suggest certain medicines if he or she feels that they will help improve your condition. Avoid using alcohol and other substances that may prevent your medicines from working properly (may interact). It is also important to:  Talk with your pharmacist or health care provider about all the medicines that you take, their possible side effects, and what medicines are safe to take together.  Make it your goal to take part in all treatment decisions (shared decision-making). This includes giving input on the side   effects of medicines. It is best if shared decision-making with your health care provider is part of your total treatment plan.  If your health care provider prescribes a medicine, you may not notice the full benefits of it for 4-8 weeks. Most people who are treated for depression need to be on medicine for at least 6-12 months after they feel better. If you are taking  medicines as part of your treatment, do not stop taking medicines without first talking to your health care provider. You may need to have the medicine slowly decreased (tapered) over time to decrease the risk of harmful side effects. Relationships Your health care provider may suggest family therapy along with individual therapy and drug therapy. While there may not be family problems that are causing you to feel depressed, it is still important to make sure your family learns as much as they can about your mental health. Having your family's support can help make your treatment successful. How to recognize changes in your condition Everyone has a different response to treatment for depression. Recovery from major depression happens when you have not had signs of major depression for two months. This may mean that you will start to:  Have more interest in doing activities.  Feel less hopeless than you did 2 months ago.  Have more energy.  Overeat less often, or have better or improving appetite.  Have better concentration.  Your health care provider will work with you to decide the next steps in your recovery. It is also important to recognize when your condition is getting worse. Watch for these signs:  Having fatigue or low energy.  Eating too much or too little.  Sleeping too much or too little.  Feeling restless, agitated, or hopeless.  Having trouble concentrating or making decisions.  Having unexplained physical complaints.  Feeling irritable, angry, or aggressive.  Get help as soon as you or your family members notice these symptoms coming back. How to get support and help from others How to talk with friends and family members about your condition Talking to friends and family members about your condition can provide you with one way to get support and guidance. Reach out to trusted friends or family members, explain your symptoms to them, and let them know that you are  working with a health care provider to treat your depression. Financial resources Not all insurance plans cover mental health care, so it is important to check with your insurance carrier. If paying for co-pays or counseling services is a problem, search for a local or county mental health care center. They may be able to offer public mental health care services at low or no cost when you are not able to see a private health care provider. If you are taking medicine for depression, you may be able to get the generic form, which may be less expensive. Some makers of prescription medicines also offer help to patients who cannot afford the medicines they need. Follow these instructions at home:  Get the right amount and quality of sleep.  Cut down on using caffeine, tobacco, alcohol, and other potentially harmful substances.  Try to exercise, such as walking or lifting small weights.  Take over-the-counter and prescription medicines only as told by your health care provider.  Eat a healthy diet that includes plenty of vegetables, fruits, whole grains, low-fat dairy products, and lean protein. Do not eat a lot of foods that are high in solid fats, added sugars, or salt.    Keep all follow-up visits as told by your health care provider. This is important. Contact a health care provider if:  You stop taking your antidepressant medicines, and you have any of these symptoms: ? Nausea. ? Headache. ? Feeling lightheaded. ? Chills and body aches. ? Not being able to sleep (insomnia).  You or your friends and family think your depression is getting worse. Get help right away if:  You have thoughts of hurting yourself or others. If you ever feel like you may hurt yourself or others, or have thoughts about taking your own life, get help right away. You can go to your nearest emergency department or call:  Your local emergency services (911 in the [place]  A suicide crisis helpline, such as the  National Suicide Prevention Lifeline at 1-800-273-8255. This is open 24-hours a day.  Summary  If you are living with depression, there are ways to help you recover from it and also ways to prevent it from coming back.  Work with your health care team to create a management plan that includes counseling, stress management techniques, and healthy lifestyle habits. This information is not intended to replace advice given to you by your health care provider. Make sure you discuss any questions you have with your health care provider. Document Released: 05/16/2016 Document Revised: 05/16/2016 Document Reviewed: 05/16/2016 Elsevier Interactive Patient Education  2018 [employer]  

## 2018-01-17 ENCOUNTER — Telehealth (INDEPENDENT_AMBULATORY_CARE_PROVIDER_SITE_OTHER): Payer: Self-pay

## 2018-01-17 LAB — MICROALBUMIN / CREATININE URINE RATIO
CREATININE, UR: 186.8 mg/dL
MICROALB/CREAT RATIO: 5.1 mg/g{creat} (ref 0.0–30.0)
MICROALBUM., U, RANDOM: 9.5 ug/mL

## 2018-01-17 NOTE — Telephone Encounter (Signed)
Patient is aware of normal urinary proteins. Nat Christen, CMA

## 2018-01-17 NOTE — Telephone Encounter (Signed)
-----   Message from Clent Demark, PA-C sent at 01/17/2018  1:42 PM EDT ----- Normal urinary proteins.

## 2018-01-28 DIAGNOSIS — Z79899 Other long term (current) drug therapy: Secondary | ICD-10-CM | POA: Diagnosis not present

## 2018-01-28 DIAGNOSIS — Z91048 Other nonmedicinal substance allergy status: Secondary | ICD-10-CM | POA: Diagnosis not present

## 2018-01-28 DIAGNOSIS — Z7984 Long term (current) use of oral hypoglycemic drugs: Secondary | ICD-10-CM | POA: Diagnosis not present

## 2018-01-28 DIAGNOSIS — Z982 Presence of cerebrospinal fluid drainage device: Secondary | ICD-10-CM | POA: Diagnosis not present

## 2018-01-28 DIAGNOSIS — E119 Type 2 diabetes mellitus without complications: Secondary | ICD-10-CM | POA: Diagnosis not present

## 2018-01-28 DIAGNOSIS — I10 Essential (primary) hypertension: Secondary | ICD-10-CM | POA: Diagnosis not present

## 2018-01-28 DIAGNOSIS — Z91040 Latex allergy status: Secondary | ICD-10-CM | POA: Diagnosis not present

## 2018-01-28 DIAGNOSIS — J45909 Unspecified asthma, uncomplicated: Secondary | ICD-10-CM | POA: Diagnosis not present

## 2018-01-28 DIAGNOSIS — M25561 Pain in right knee: Secondary | ICD-10-CM | POA: Diagnosis not present

## 2018-01-28 DIAGNOSIS — Z880 Allergy status to penicillin: Secondary | ICD-10-CM | POA: Diagnosis not present

## 2018-01-28 DIAGNOSIS — G8929 Other chronic pain: Secondary | ICD-10-CM | POA: Diagnosis not present

## 2018-02-13 ENCOUNTER — Other Ambulatory Visit: Payer: Self-pay | Admitting: Optometry

## 2018-02-13 DIAGNOSIS — Z1231 Encounter for screening mammogram for malignant neoplasm of breast: Secondary | ICD-10-CM

## 2018-02-18 ENCOUNTER — Other Ambulatory Visit (INDEPENDENT_AMBULATORY_CARE_PROVIDER_SITE_OTHER): Payer: Self-pay | Admitting: Physician Assistant

## 2018-02-18 DIAGNOSIS — M25512 Pain in left shoulder: Secondary | ICD-10-CM

## 2018-02-19 NOTE — Telephone Encounter (Signed)
FWD to PCP. Tempestt S Roberts, CMA  

## 2018-02-20 ENCOUNTER — Other Ambulatory Visit (INDEPENDENT_AMBULATORY_CARE_PROVIDER_SITE_OTHER): Payer: Self-pay | Admitting: Physician Assistant

## 2018-02-20 ENCOUNTER — Telehealth (INDEPENDENT_AMBULATORY_CARE_PROVIDER_SITE_OTHER): Payer: Self-pay | Admitting: Physician Assistant

## 2018-02-20 DIAGNOSIS — E119 Type 2 diabetes mellitus without complications: Secondary | ICD-10-CM

## 2018-02-20 MED ORDER — SITAGLIPTIN PHOS-METFORMIN HCL 50-1000 MG PO TABS: 1.0000 | ORAL_TABLET | Freq: Two times a day (BID) | ORAL | 11 refills | Status: DC

## 2018-02-20 NOTE — Telephone Encounter (Signed)
Patient called requesting medication refill for   sitaGLIPtin-metformin (JANUMET) 50-1000 MG tablet   Patient uses  [id number] - [place], [place] - [address]  Please advice [phone]  Thank you Emmit Pomfret

## 2018-02-20 NOTE — Telephone Encounter (Signed)
Refill sent. Please notify patient and have [facility] pick [facility] at [facility].

## 2018-02-21 NOTE — Telephone Encounter (Signed)
Patient aware.   Thank you Mackenzie Patterson

## 2018-02-27 ENCOUNTER — Encounter (INDEPENDENT_AMBULATORY_CARE_PROVIDER_SITE_OTHER): Payer: Self-pay | Admitting: Physician Assistant

## 2018-02-27 ENCOUNTER — Other Ambulatory Visit: Payer: Self-pay

## 2018-02-27 ENCOUNTER — Ambulatory Visit (INDEPENDENT_AMBULATORY_CARE_PROVIDER_SITE_OTHER): Payer: Medicaid Other | Admitting: Physician Assistant

## 2018-02-27 VITALS — BP 144/87 | HR 78 | Temp 97.6°F | Ht 59.0 in | Wt 167.0 lb

## 2018-02-27 DIAGNOSIS — R300 Dysuria: Secondary | ICD-10-CM

## 2018-02-27 DIAGNOSIS — Z9119 Patient's noncompliance with other medical treatment and regimen: Secondary | ICD-10-CM | POA: Diagnosis not present

## 2018-02-27 DIAGNOSIS — M25561 Pain in right knee: Secondary | ICD-10-CM | POA: Diagnosis not present

## 2018-02-27 DIAGNOSIS — G8929 Other chronic pain: Secondary | ICD-10-CM

## 2018-02-27 DIAGNOSIS — Z91199 Patient's noncompliance with other medical treatment and regimen due to unspecified reason: Secondary | ICD-10-CM

## 2018-02-27 DIAGNOSIS — F418 Other specified anxiety disorders: Secondary | ICD-10-CM

## 2018-02-27 LAB — POCT URINALYSIS DIPSTICK
GLUCOSE UA: NEGATIVE
LEUKOCYTES UA: NEGATIVE
Nitrite, UA: NEGATIVE
PROTEIN UA: NEGATIVE
SPEC GRAV UA: 1.025 (ref 1.010–1.025)
Urobilinogen, UA: 0.2 E.U./dL
pH, UA: 5.5 (ref 5.0–8.0)

## 2018-02-27 MED ORDER — ACETAMINOPHEN 500 MG PO TABS: 1000.0000 mg | ORAL_TABLET | Freq: Three times a day (TID) | ORAL | 0 refills | Status: AC | PRN

## 2018-02-27 NOTE — Patient Instructions (Signed)
Major Depressive Disorder, Adult Major depressive disorder (MDD) is a mental health condition. It may also be called clinical depression or unipolar depression. MDD usually causes feelings of sadness, hopelessness, or helplessness. MDD can also cause physical symptoms. It can interfere with work, school, relationships, and other everyday activities. MDD may be mild, moderate, or severe. It may occur once (single episode major depressive disorder) or it may occur multiple times (recurrent major depressive disorder). What are the causes? The exact cause of this condition is not known. MDD is most likely caused by a combination of things, which may include:  Genetic factors. These are traits that are passed along from parent to child.  Individual factors. Your personality, your behavior, and the way you handle your thoughts and feelings may contribute to MDD. This includes personality traits and behaviors learned from others.  Physical factors, such as: ? Differences in the part of your brain that controls emotion. This part of your brain may be different than it is in people who do not have MDD. ? Long-term (chronic) medical or psychiatric illnesses.  Social factors. Traumatic experiences or major life changes may play a role in the development of MDD.  What increases the risk? This condition is more likely to develop in women. The following factors may also make you more likely to develop MDD:  A family history of depression.  Troubled family relationships.  Abnormally low levels of certain brain chemicals.  Traumatic events in childhood, especially abuse or the loss of a parent.  Being under a lot of stress, or long-term stress, especially from upsetting life experiences or losses.  A history of: ? Chronic physical illness. ? Other mental health disorders. ? Substance abuse.  Poor living conditions.  Experiencing social exclusion or discrimination on a regular basis.  What are  the signs or symptoms? The main symptoms of MDD typically include:  Constant depressed or irritable mood.  Loss of interest in things and activities.  MDD symptoms may also include:  Sleeping or eating too much or too little.  Unexplained weight change.  Fatigue or low energy.  Feelings of worthlessness or guilt.  Difficulty thinking clearly or making decisions.  Thoughts of suicide or of harming others.  Physical agitation or weakness.  Isolation.  Severe cases of MDD may also occur with other symptoms, such as:  Delusions or hallucinations, in which you imagine things that are not real (psychotic depression).  Low-level depression that lasts at least a year (chronic depression or persistent depressive disorder).  Extreme sadness and hopelessness (melancholic depression).  Trouble speaking and moving (catatonic depression).  How is this diagnosed? This condition may be diagnosed based on:  Your symptoms.  Your medical history, including your mental health history. This may involve tests to evaluate your mental health. You may be asked questions about your lifestyle, including any drug and alcohol use, and how long you have had symptoms of MDD.  A physical exam.  Blood tests to rule out other conditions.  You must have a depressed mood and at least four other MDD symptoms most of the day, nearly every day in the same 2-week timeframe before your health care provider can confirm a diagnosis of MDD. How is this treated? This condition is usually treated by mental health professionals, such as psychologists, psychiatrists, and clinical social workers. You may need more than one type of treatment. Treatment may include:  Psychotherapy. This is also called talk therapy or counseling. Types of psychotherapy include: ? Cognitive behavioral   therapy (CBT). This type of therapy teaches you to recognize unhealthy feelings, thoughts, and behaviors, and replace them with  positive thoughts and actions. ? Interpersonal therapy (IPT). This helps you to improve the way you relate to and communicate with others. ? Family therapy. This treatment includes members of your family.  Medicine to treat anxiety and depression, or to help you control certain emotions and behaviors.  Lifestyle changes, such as: ? Limiting alcohol and drug use. ? Exercising regularly. ? Getting plenty of sleep. ? Making healthy eating choices. ? Spending more time outdoors.  Treatments involving stimulation of the brain can be used in situations with extremely severe symptoms, or when medicine or other therapies do not work over time. These treatments include electroconvulsive therapy, transcranial magnetic stimulation, and vagal nerve stimulation. Follow these instructions at home: Activity  Return to your normal activities as told by your health care provider.  Exercise regularly and spend time outdoors as told by your health care provider. General instructions  Take over-the-counter and prescription medicines only as told by your health care provider.  Do not drink alcohol. If you drink alcohol, limit your alcohol intake to no more than 1 drink a day for nonpregnant women and 2 drinks a day for men. One drink equals 12 oz of beer, 5 oz of wine, or 1 oz of hard liquor. Alcohol can affect any antidepressant medicines you are taking. Talk to your health care provider about your alcohol use.  Eat a healthy diet and get plenty of sleep.  Find activities that you enjoy doing, and make time to do them.  Consider joining a support group. Your health care provider may be able to recommend a support group.  Keep all follow-up visits as told by your health care provider. This is important. Where to find more information: National Alliance on Mental Illness  www.nami.org  U.S. National Institute of Mental Health  www.nimh.nih.gov  National Suicide Prevention  Lifeline  1-800-273-TALK (8255). This is free, 24-hour help.  Contact a health care provider if:  Your symptoms get worse.  You develop new symptoms. Get help right away if:  You self-harm.  You have serious thoughts about hurting yourself or others.  You see, hear, taste, smell, or feel things that are not present (hallucinate). This information is not intended to replace advice given to you by your health care provider. Make sure you discuss any questions you have with your health care provider. Document Released: 10/08/2012 Document Revised: 02/18/2016 Document Reviewed: 12/23/2015 Elsevier Interactive Patient Education  2018 [employer].  

## 2018-02-27 NOTE — Progress Notes (Signed)
Subjective:  [name], female    DOB: 11-Sep-1970  Age: 47 y.o. MRN: [id number]  CC: f/u depression with anxiety. Knee pain, back pain  HPI Mackenzie Patterson a 47 y.o.femalewith a medical history of asthma, congenital VSD(managed by [facility]), DM2, heart murmur, HTN, TIA, obesity, pulmonary HTN, and tricuspid regurgitation presents for f/u of depression with anxiety. Symptoms included anorexia, insomnia, anhedonia, isolation, irritability, and fear of losing disability benefits. Says she is still feeling the same symptoms but she has mustered enough interest to go out and help a family member with activities for the community. Prescribed escitalopram 10 mg and hydroxyzine 25 mg but only taking escitalopram PRN. Takes Hydroxyzine 25 mg qhs which helps with initiating sleep but not in maintaining sleep. PHQ9 12 and GAD7 12 one month ago. PHQ9 9 and GAD7 12 today.    Right knee pain since two months ago. Attributed to constantly banging her knee on part of her car's dash upon entering her vehicle. Says she keeps the steering wheel and the car seat close to each other. Does not move her seat back upon exiting but she is thinking about doing so. There is a "gritty" feeling behind her patella and states her right knee has "given out" while she was walking. Has taken ibuprofen 400 mg q6hrs daily since August 15th. Alternates with Tylenol. Went to ED for her knee pain but says they did nothing for her.    Says patient thinks she has a UTI due to dysuria. Dysuria only happens when she comes off her cycle, "maybe four days after I come off". She also thinks her sugar may be up. Feels as though the dysuria might be self resolving. There is right mid back pain since four days ago. Does not know if associated with dysuria.     Outpatient Medications Prior to Visit  Medication Sig Dispense Refill  . ACCU-CHEK FASTCLIX LANCETS MISC USE 2 TIMES DAILY AS DIRECTED  10  . ACCU-CHEK SOFTCLIX  LANCETS lancets 2 (two) times daily. As directed    . amLODipine (NORVASC) 5 MG tablet Take 5 mg by mouth daily.    Marland Kitchen aspirin EC 325 MG EC tablet Take 1 tablet (325 mg total) by mouth daily. 30 tablet 0  . atorvastatin (LIPITOR) 80 MG tablet Take 1 tablet (80 mg total) by mouth daily at 6 PM. 30 tablet 0  . Blood Glucose Monitoring Suppl (ACCU-CHEK AVIVA PLUS) w/Device KIT 1 kit by Does not apply route daily. 1 kit 0  . carvedilol (COREG) 3.125 MG tablet Take 3.125 mg by mouth 2 (two) times daily.    Marland Kitchen escitalopram (LEXAPRO) 10 MG tablet Take 1 tablet (10 mg total) by mouth daily. 30 tablet 2  . hydrOXYzine (ATARAX/VISTARIL) 25 MG tablet Take 1 tablet (25 mg total) by mouth at bedtime. 30 tablet 0  . methocarbamol (ROBAXIN) 500 MG tablet Take 1 tablet (500 mg total) by mouth 2 (two) times daily. 30 tablet 0  . sitaGLIPtin-metformin (JANUMET) 50-1000 MG tablet Take 1 tablet by mouth 2 (two) times daily with a meal. 60 tablet 11   No facility-administered medications prior to visit.      ROS Review of Systems  Constitutional: Negative for chills, fever and malaise/fatigue.  Eyes: Negative for blurred vision.  Respiratory: Negative for shortness of breath.   Cardiovascular: Negative for chest pain and palpitations.  Gastrointestinal: Negative for abdominal pain and nausea.  Genitourinary: Negative for dysuria and hematuria.  Musculoskeletal: Negative for  joint pain and myalgias.  Skin: Negative for rash.  Neurological: Negative for tingling and headaches.  Psychiatric/Behavioral: Negative for depression. The patient is not nervous/anxious.     Objective:  Ht _0  (1.499 m)   Wt 167 lb (75.8 kg)   LMP 02/06/2018 (Approximate)   BMI 33.73 kg/m   Vitals:   02/27/18 0835  BP: (!) 144/87  Pulse: 78  Temp: 97.6 F (36.4 C)  TempSrc: Oral  SpO2: 100%  Weight: 167 lb (75.8 kg)  Height: _1  (1.499 m)      Physical Exam  Constitutional: She is oriented to person, place, and  time.  Well developed, well nourished, NAD, reserved, polite, well groomed and hair is made up.  HENT:  Head: Normocephalic and atraumatic.  Eyes: No scleral icterus.  Neck: Normal range of motion. Neck supple. No thyromegaly present.  Cardiovascular: Normal rate, regular rhythm and normal heart sounds.  Pulmonary/Chest: Effort normal and breath sounds normal.  Musculoskeletal: She exhibits no edema.  Right knee with mildly positive patellar grind. Negative anterior/posterior drawer and varus/valgus stress testing. Normal aROM of the right knee, no effusion, no edema, no deformity.  Neurological: She is alert and oriented to person, place, and time.  Skin: Skin is warm and dry. No rash noted. No erythema. No pallor.  Psychiatric: Her behavior is normal. Thought content normal.  Depressed mood, constricted affect.  Vitals reviewed.    Assessment & Plan:   1. Depression with anxiety - Noncompliance with escitalopram. Advised to take daily and not PRN to have appreciable effect on depression with anxiety.   2. Dysuria - Urinalysis Dipstick unremarkable  3. Chronic pain of right knee - DG Knee Complete 4 Views Right; Future  4. Non-compliance - Noncompliance with escitalopram. Advised to take as directed.     Follow-up: Return in about 6 weeks (around 04/10/2018).   Clent Demark PA

## 2018-03-13 ENCOUNTER — Other Ambulatory Visit (INDEPENDENT_AMBULATORY_CARE_PROVIDER_SITE_OTHER): Payer: Self-pay | Admitting: Physician Assistant

## 2018-03-13 ENCOUNTER — Ambulatory Visit (INDEPENDENT_AMBULATORY_CARE_PROVIDER_SITE_OTHER): Payer: Medicaid Other

## 2018-03-13 ENCOUNTER — Telehealth (INDEPENDENT_AMBULATORY_CARE_PROVIDER_SITE_OTHER): Payer: Self-pay

## 2018-03-13 ENCOUNTER — Ambulatory Visit (INDEPENDENT_AMBULATORY_CARE_PROVIDER_SITE_OTHER): Payer: Medicaid Other | Admitting: Orthopaedic Surgery

## 2018-03-13 DIAGNOSIS — M25561 Pain in right knee: Secondary | ICD-10-CM

## 2018-03-13 MED ORDER — MELOXICAM 7.5 MG PO TABS: 7.5000 mg | ORAL_TABLET | Freq: Two times a day (BID) | ORAL | 2 refills | Status: DC | PRN

## 2018-03-13 MED ORDER — DICLOFENAC SODIUM 1 % TD GEL: 2.0000 g | Freq: Four times a day (QID) | TRANSDERMAL | 5 refills | Status: DC

## 2018-03-13 NOTE — Progress Notes (Signed)
Office Visit Note   Patient: Mackenzie Patterson           Date of Birth: 1970/07/01           MRN: [id number] Visit Date: 03/13/2018              Requested by: Clent Demark, PA-C [place], [place] [address] PCP: Clent Demark, PA-C   Assessment & Plan: Visit Diagnoses:  1. Acute pain of right knee     Plan: Impression is right knee chondromalacia patella.  Recommend quadricep strengthening and supportive treatment.  Questions encouraged and answered.  Prescription for Voltaren gel and meloxicam.  Hinged knee brace was provided today.  Follow-up as needed.  Follow-Up Instructions: Return if symptoms worsen or fail to improve.   Orders:  Orders Placed This Encounter  Procedures  . XR KNEE 3 VIEW RIGHT   Meds ordered this encounter  Medications  . diclofenac sodium (VOLTAREN) 1 % GEL    Sig: Apply 2 g topically 4 (four) times daily.    Dispense:  1 Tube    Refill:  5  . meloxicam (MOBIC) 7.5 MG tablet    Sig: Take 1 tablet (7.5 mg total) by mouth 2 (two) times daily as needed for pain.    Dispense:  30 tablet    Refill:  2      Procedures: No procedures performed   Clinical Data: No additional findings.   Subjective: Chief Complaint  Patient presents with  . Right Knee - Pain    Mackenzie Patterson is a 47 year old female who comes in with approximately of right knee pain around the patella with radiation of pain into the popliteal fossa.  Denies any mechanical symptoms.  She does endorse some giving way due to the pain.  She is diabetic and she has tried over-the-counter medicines without any.  Denies any numbness and tingling and denies any back pain.  Denies any injuries or previous surgeries.   Review of Systems  Constitutional: Negative.   HENT: Negative.   Eyes: Negative.   Respiratory: Negative.   Cardiovascular: Negative.   Endocrine: Negative.   Musculoskeletal: Negative.   Neurological: Negative.   Hematological: Negative.     Psychiatric/Behavioral: Negative.   All other systems reviewed and are negative.    Objective: Vital Signs: There were no vitals taken for this visit.  Physical Exam  Constitutional: She is oriented to person, place, and time. She appears well-developed and well-nourished.  HENT:  Head: Normocephalic and atraumatic.  Eyes: EOM are normal.  Neck: Neck supple.  Pulmonary/Chest: Effort normal.  Abdominal: Soft.  Neurological: She is alert and oriented to person, place, and time.  Skin: Skin is warm. Capillary refill takes less than 2 seconds.  Psychiatric: She has a normal mood and affect. Her behavior is normal. Judgment and thought content normal.  Nursing note and vitals reviewed.   Ortho Exam Right knee exam shows no joint effusion.  Collaterals and cruciates are stable.  She is somewhat jumpy with just light palpation around the knee.  Normal range of motion. Specialty Comments:  No specialty comments available.  Imaging: Xr Knee 3 View Right  Result Date: 03/13/2018 No acute or structural abnormalities    PMFS History: Patient Active Problem List   Diagnosis Date Noted  . TIA (transient ischemic attack) 10/24/2017  . Menorrhagia 12/12/2016  . Ventricular septal defect (VSD), membranous 09/02/2016  . Pulmonary hypertension, primary ([place]) 08/23/2016  . Chest tightness 08/03/2016  .  Cough 08/03/2016  . Dyspnea and respiratory abnormality 04/16/2014  . Tricuspid regurgitation   . Asthma   . VSD (ventricular septal defect), perimembranous 09/03/2013  . Pulmonary hypertension ([place]) 09/03/2013  . Essential hypertension 08/06/2013  . Diabetes mellitus ([place]) 08/06/2013  . Obesity 08/06/2013  . Hypertension 07/27/2012  . Awareness of heartbeats 07/27/2012  . 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 Perimembranous ventricular septal defect 07/27/2012   Past Medical History:  Diagnosis Date  . Asthma   . Breast discharge 01/10/2017  . Congenital  ventricular septal defect   . Diabetes mellitus without complication ([place])   . Heart murmur   . Hypertension   . Obesity   . Pulmonary hypertension (HCC)    mild   . Tricuspid regurgitation   . VSD (ventricular septal defect), perimembranous    Qp:Qs 1.7:1 by catheterization     Family History  Problem Relation Age of Onset  . Cancer Other   . Hyperlipidemia Other   . Hypertension Other   . Asthma Other   . Diabetes Mother   . Diabetes Father     Past Surgical History:  Procedure Laterality Date  . CARDIAC CATHETERIZATION [name] 10/30/2014   Procedure: Right Heart Cath;  Surgeon: [name], MD;  Location: [facility] INVASIVE CV LAB CUPID;  Service: Cardiovascular;  Laterality: [name];  . CHOLECYSTECTOMY  2018  . CYST EXCISION    . LEFT AND RIGHT HEART CATHETERIZATION WITH CORONARY ANGIOGRAM [name] 09/11/2013   Procedure: LEFT AND RIGHT HEART CATHETERIZATION WITH CORONARY ANGIOGRAM;  Surgeon: [name], MD;  Location: [facility] CATH LAB;  Service: Cardiovascular;  Laterality: [name];  . RIGHT HEART CATH [name] 09/02/2016   Procedure: Right Heart Cath;  Surgeon: [name], MD;  Location: [facility] CV LAB;  Service: Cardiovascular;  Laterality: [name];  . TUBAL LIGATION     Social History   Occupational History  . Not on file  Tobacco Use  . Smoking status: Never Smoker  . Smokeless tobacco: Never Used  Substance and Sexual Activity  . Alcohol use: No  . Drug use: No  . Sexual activity: Yes    Birth control/protection: Surgical

## 2018-03-13 NOTE — Telephone Encounter (Signed)
FWD to PCP. Jaynia Fendley S Christepher Melchior, CMA  

## 2018-03-13 NOTE — Telephone Encounter (Signed)
Patient needs referral to Breast center to have diagnostic mammogram due to nipple discharge. She was told she needed this referral by [facility] staff. Nat Christen, CMA

## 2018-03-14 ENCOUNTER — Other Ambulatory Visit (INDEPENDENT_AMBULATORY_CARE_PROVIDER_SITE_OTHER): Payer: Self-pay | Admitting: Physician Assistant

## 2018-03-14 DIAGNOSIS — N6452 Nipple discharge: Secondary | ICD-10-CM

## 2018-03-14 NOTE — Telephone Encounter (Signed)
Mammogram has been scheduled and patient is aware of appointment location. Date and time; and all specific instructions related to appointment. Nat Christen, CMA

## 2018-03-14 NOTE — Telephone Encounter (Signed)
I have ordered a diagnostic mammogram bilateral for nipple discharge.

## 2018-03-20 ENCOUNTER — Ambulatory Visit: Payer: Medicaid Other | Attending: Orthopaedic Surgery | Admitting: Physical Therapy

## 2018-03-20 ENCOUNTER — Encounter: Payer: Self-pay | Admitting: Physical Therapy

## 2018-03-20 DIAGNOSIS — R262 Difficulty in walking, not elsewhere classified: Secondary | ICD-10-CM | POA: Diagnosis not present

## 2018-03-20 DIAGNOSIS — M6281 Muscle weakness (generalized): Secondary | ICD-10-CM

## 2018-03-20 DIAGNOSIS — M25561 Pain in right knee: Secondary | ICD-10-CM | POA: Insufficient documentation

## 2018-03-20 NOTE — Therapy (Signed)
[place], [place], [address] Phone: [phone]   Fax:  [phone]  Physical Therapy Evaluation  Patient Details  Name: Mackenzie Patterson MRN: [id number] Date of Birth: 03-Oct-1970 Referring Provider: [name]   Encounter Date: 03/20/2018  PT End of Session - 03/20/18 0927    Visit Number  1    Number of Visits  4    Date for PT Re-Evaluation  04/17/18    Authorization Type  M[employer] request for auth sent,     Authorization Time Period  through 10/22 asked for 3 treatments,     PT Start Time  0927    PT Stop Time  1013    PT Time Calculation (min)  46 min       Past Medical History:  Diagnosis Date  . Asthma   . Breast discharge 01/10/2017  . Congenital ventricular septal defect   . Diabetes mellitus without complication ([place])   . Heart murmur   . Hypertension   . Obesity   . Pulmonary hypertension (HCC)    mild   . Tricuspid regurgitation   . VSD (ventricular septal defect), perimembranous    Qp:Qs 1.7:1 by catheterization     Past Surgical History:  Procedure Laterality Date  . CARDIAC CATHETERIZATION [name] 10/30/2014   Procedure: Right Heart Cath;  Surgeon: [name], MD;  Location: [facility] INVASIVE CV LAB CUPID;  Service: Cardiovascular;  Laterality: [name];  . CHOLECYSTECTOMY  2018  . CYST EXCISION    . LEFT AND RIGHT HEART CATHETERIZATION WITH CORONARY ANGIOGRAM [name] 09/11/2013   Procedure: LEFT AND RIGHT HEART CATHETERIZATION WITH CORONARY ANGIOGRAM;  Surgeon: [name], MD;  Location: [facility] CATH LAB;  Service: Cardiovascular;  Laterality: [name];  . RIGHT HEART CATH [name] 09/02/2016   Procedure: Right Heart Cath;  Surgeon: [name], MD;  Location: [place] CV LAB;  Service: Cardiovascular;  Laterality: [name];  . TUBAL LIGATION      There were no vitals filed for this visit.   Subjective Assessment - 03/20/18 0929    Subjective  Pt reports she has had Rt knee pain for about 3 months  insideous onset.  pain increased and she went to the md    How long can you sit comfortably?  has pain if the knee is bent for about 5-10'     How long can you walk comfortably?  tolerates about 7'     Diagnostic tests  x-rays arthritis    Patient Stated Goals  get back walking like she was, was walking about and hour a day - has a cruise next year and wants to use her leg.     Currently in Pain?  Yes    Pain Score  8     Pain Location  Knee    Pain Orientation  Right;Posterior    Pain Descriptors / Indicators  Numbness;Throbbing;Aching    Pain Type  Acute pain    Pain Onset  More than a month ago    Pain Frequency  Constant    Aggravating Factors   walking, being still for prolonged periods    Pain Relieving Factors  ice and heat alternating, OTC meds         [facility] PT Assessment - 03/20/18 0001      Assessment   Medical Diagnosis  Rt knee chondromalacia    Referring Provider  [name]    Onset Date/Surgical Date  12/18/17  Hand Dominance  Right    Next MD Visit  after PT    Prior Therapy  only for her ankle, non for the knee      Precautions   Precautions  None    Required Braces or Orthoses  --   knee stability brace Rt for when up     Balance Screen   Has the patient fallen in the past 6 months  No      [place] residence    Home Access  Stairs to enter   has to take her time and use rails   Home Layout  One level      Prior Function   Level of Independence  Independent    Vocation  Unemployed    Leisure  shop       Observation/Other Assessments   Other Surveys   Other Surveys    Lower Extremity Functional Scale   34      Functional Tests   Functional tests  Squat;Single leg stance      Squat   Comments  decreased WB Rt LE, use hands on thighs to assist      Single Leg Stance   Comments  SLS Rt 3 sec with pain, Lt > 15 sec      Posture/Postural Control   Posture Comments  hypertrophy in rt quad compared to  Lt       ROM / Strength   AROM / PROM / Strength  AROM;Strength      AROM   Overall AROM Comments  severe lateral tracking bilat patella    AROM Assessment Site  Knee    Right/Left Knee  Right;Left    Right Knee Extension  0    Right Knee Flexion  108   Limited d/t pain   Left Knee Extension  0    Left Knee Flexion  143      Strength   Strength Assessment Site  Hip;Knee;Ankle    Right/Left Hip  Right    Right Hip Flexion  4/5   pain around knee jt   Right Hip Extension  --   5-/5   Right Hip ABduction  4/5    Right/Left Knee  Right   Lt WNL   Right Knee Flexion  4/5   pain at HS insertion area   Right Knee Extension  4+/5    Right/Left Ankle  --   WNL     Flexibility   Soft Tissue Assessment /Muscle Length  yes    Hamstrings  bilat WNL    Quadriceps  prone knee flex Rt 85 - limited d/t pain, Lt  heel to buttocks.     ITB  tight      Palpation   Patella mobility  lateral tracking, patella alta    Palpation comment  hypersensitive to palpation all around Rt knee                 Objective measurements completed on examination: See above findings.      [place] Adult PT Treatment/Exercise - 03/20/18 0001      Exercises   Exercises  Knee/Hip      Knee/Hip Exercises: Stretches   Quad Stretch  Right;30 seconds   prone with strap     Knee/Hip Exercises: Seated   Other Seated Knee/Hip Exercises  10 reps SLR with ER Rt LE       Knee/Hip Exercises: Supine   Quad Sets  Strengthening;Right;10 reps      Knee/Hip Exercises: Prone   Other Prone Exercises  quad sets bilat              PT Education - 03/20/18 0939    Education Details  HEP    Person(s) Educated  Patient    Methods  Explanation;Demonstration;Handout    Comprehension  Returned demonstration;Verbalized understanding       PT Short Term Goals - 03/20/18 0936      PT SHORT TERM GOAL #1   Title  Patient will be independent with basic HEP for lower body .     Baseline  has not been  able to perform any exercise d/t pain    Time  4    Period  Weeks    Status  New    Target Date  04/17/18      PT SHORT TERM GOAL #2   Title  improve LEFS score =/> 9 point improvement,     Baseline  34 score    Time  4    Period  Weeks    Status  New    Target Date  04/17/18      PT SHORT TERM GOAL #3   Title  increase Rt prone quad flexibility =/> 115 degrees    Baseline  current 80 degrees    Time  4    Period  Weeks    Status  New      PT SHORT TERM GOAL #4   Title  improve Rt knee and hip strength to allow her to progress to SLS exercises    Baseline  grossly 4- to 4+/5 unable to stand more than 3 sec on Rt LE     Time  4    Period  Weeks    Status  New    Target Date  04/17/18        PT Long Term Goals - 03/20/18 1030      PT LONG TERM GOAL #1   Title  to be established at reassessment    Time  4    Period  Weeks    Status  New      PT LONG TERM GOAL #2   Title  -------      PT LONG TERM GOAL #3   Title  --------      PT LONG TERM GOAL #4   Title  -------      PT LONG TERM GOAL #5   Title  ------             Plan - 03/20/18 2725    Clinical Impression Statement  50 to female presents with Rt knee pain of insideous onset about 3 months ago.  She has since developed Rt knee and hip weakness, generalized pain around the Rt knee, limited weighbearing and significant lateral tracking Rt patella.     Clinical Presentation  Stable    Clinical Decision Making  Low    Rehab Potential  Good    PT Frequency  1x / week    PT Duration  4 weeks    PT Treatment/Interventions  Iontophoresis 4mg /ml Dexamethasone;Gait training;Stair training;Neuromuscular re-education;Dry needling;Manual techniques;Moist Heat;Therapeutic activities;Patient/family education;Taping;Vasopneumatic Device;Therapeutic exercise;Cryotherapy;Electrical Stimulation;Balance training    PT Next Visit Plan  attempt taping for lateral patellar taping, quad strengthening along with hip      Consulted and Agree with Plan of Care  Patient       Patient will benefit from skilled therapeutic intervention  in order to improve the following deficits and impairments:  Pain, Decreased mobility, Decreased range of motion, Decreased strength, Impaired flexibility  Visit Diagnosis: Acute pain of right knee - Plan: PT plan of care cert/re-cert  Muscle weakness (generalized) - Plan: PT plan of care cert/re-cert  Difficulty in walking, not elsewhere classified - Plan: PT plan of care cert/re-cert     Problem List Patient Active Problem List   Diagnosis Date Noted  . TIA (transient ischemic attack) 10/24/2017  . Menorrhagia 12/12/2016  . Ventricular septal defect (VSD), membranous 09/02/2016  . Pulmonary hypertension, primary ([place]) 08/23/2016  . Chest tightness 08/03/2016  . Cough 08/03/2016  . Dyspnea and respiratory abnormality 04/16/2014  . Tricuspid regurgitation   . Asthma   . VSD (ventricular septal defect), perimembranous 09/03/2013  . Pulmonary hypertension ([place]) 09/03/2013  . Essential hypertension 08/06/2013  . Diabetes mellitus ([place]) 08/06/2013  . Obesity 08/06/2013  . Hypertension 07/27/2012  . Awareness of heartbeats 07/27/2012  . 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 Perimembranous ventricular septal defect 07/27/2012    Jeral Pinch PT  03/20/2018, 10:32 AM  [facility] [address] [place], [place], [address] Phone: [phone]   Fax:  [phone]  Name: Ingri Diemer MRN: [id number] Date of Birth: May 10, 1971

## 2018-03-21 ENCOUNTER — Other Ambulatory Visit (INDEPENDENT_AMBULATORY_CARE_PROVIDER_SITE_OTHER): Payer: Self-pay | Admitting: Physician Assistant

## 2018-03-21 ENCOUNTER — Ambulatory Visit: Payer: Medicaid Other

## 2018-03-21 ENCOUNTER — Ambulatory Visit
Admission: RE | Admit: 2018-03-21 | Discharge: 2018-03-21 | Disposition: A | Discharge status: Home / Self Care | Payer: Medicaid Other | Source: Ambulatory Visit | Attending: Physician Assistant | Admitting: Physician Assistant

## 2018-03-21 DIAGNOSIS — Z1231 Encounter for screening mammogram for malignant neoplasm of breast: Secondary | ICD-10-CM

## 2018-03-21 DIAGNOSIS — N6452 Nipple discharge: Secondary | ICD-10-CM

## 2018-03-27 ENCOUNTER — Ambulatory Visit: Payer: Medicaid Other | Attending: Orthopaedic Surgery | Admitting: Physical Therapy

## 2018-03-27 ENCOUNTER — Encounter: Payer: Self-pay | Admitting: Physical Therapy

## 2018-03-27 DIAGNOSIS — M25672 Stiffness of left ankle, not elsewhere classified: Secondary | ICD-10-CM | POA: Diagnosis not present

## 2018-03-27 DIAGNOSIS — M25572 Pain in left ankle and joints of left foot: Secondary | ICD-10-CM

## 2018-03-27 DIAGNOSIS — R262 Difficulty in walking, not elsewhere classified: Secondary | ICD-10-CM

## 2018-03-27 DIAGNOSIS — M6281 Muscle weakness (generalized): Secondary | ICD-10-CM | POA: Diagnosis not present

## 2018-03-27 DIAGNOSIS — M25561 Pain in right knee: Secondary | ICD-10-CM | POA: Insufficient documentation

## 2018-03-27 NOTE — Therapy (Signed)
[place], [place], [address] Phone: [phone]   Fax:  [phone]  Physical Therapy Treatment  Patient Details  Name: Mackenzie Patterson MRN: [id number] Date of Birth: April 23, 1971 Referring Provider (PT): [name]   Encounter Date: 03/27/2018  PT End of Session - 03/27/18 0937    Visit Number  2    Number of Visits  4    Date for PT Re-Evaluation  04/17/18    Authorization Type  MCD request for auth sent,     Authorization Time Period  3 visits through 10/20 approved    PT Start Time  0937   7 min late   PT Stop Time  1015    PT Time Calculation (min)  38 min    Activity Tolerance  Patient tolerated treatment well       Past Medical History:  Diagnosis Date  . Asthma   . Breast discharge 01/10/2017  . Congenital ventricular septal defect   . Diabetes mellitus without complication ([place])   . Heart murmur   . Hypertension   . Obesity   . Pulmonary hypertension (HCC)    mild   . Tricuspid regurgitation   . VSD (ventricular septal defect), perimembranous    Qp:Qs 1.7:1 by catheterization     Past Surgical History:  Procedure Laterality Date  . CARDIAC CATHETERIZATION [name] 10/30/2014   Procedure: Right Heart Cath;  Surgeon: [name], MD;  Location:  [facility] INVASIVE CV LAB CUPID;  Service: Cardiovascular;  Laterality: [name];  . CHOLECYSTECTOMY  2018  . CYST EXCISION    . LEFT AND RIGHT HEART CATHETERIZATION WITH CORONARY ANGIOGRAM [name] 09/11/2013   Procedure: LEFT AND RIGHT HEART CATHETERIZATION WITH CORONARY ANGIOGRAM;  Surgeon: [name], MD;  Location: [facility] CATH LAB;  Service: Cardiovascular;  Laterality: [name];  . RIGHT HEART CATH [name] 09/02/2016   Procedure: Right Heart Cath;  Surgeon: [name], MD;  Location: [place] CV LAB;  Service: Cardiovascular;  Laterality: [name];  . TUBAL LIGATION      There were no vitals filed for this visit.  Subjective Assessment - 03/27/18 0937     Subjective  pt reports no change, she is doing her HEP    Patient Stated Goals  get back walking like she was, was walking about and hour a day - has a cruise next year and wants to use her leg.     Currently in Pain?  Yes    Pain Score  9     Pain Location  Knee    Pain Orientation  Right;Posterior    Pain Descriptors / Indicators  Aching;Throbbing    Pain Type  Acute pain    Pain Onset  More than a month ago    Pain Frequency  Constant    Aggravating Factors   walking and standing    Pain Relieving Factors  ice/heat and OTC meds         [facility] PT Assessment - 03/27/18 0001      Assessment   Medical Diagnosis  Rt knee chondromalacia      AROM   Right Knee Flexion  102   was not pushing her self this was comfortable position for p                  [facility] Adult PT Treatment/Exercise - 03/27/18 0001      Exercises   Exercises  Knee/Hip      Knee/Hip Exercises: Stretches  Pain  in left ankle and joints of left foot  Difficulty in walking, not elsewhere classified     Problem List Patient Active Problem List   Diagnosis Date Noted  . TIA (transient ischemic attack) 10/24/2017  . Menorrhagia 12/12/2016  . Ventricular septal defect (VSD), membranous 09/02/2016  . Pulmonary hypertension, primary ([place]) 08/23/2016  . Chest tightness 08/03/2016  . Cough 08/03/2016  . Dyspnea and respiratory abnormality 04/16/2014  . Tricuspid regurgitation   . Asthma   . VSD (ventricular septal defect), perimembranous 09/03/2013  . Pulmonary hypertension ([place]) 09/03/2013  . Essential hypertension 08/06/2013  . Diabetes mellitus ([place]) 08/06/2013  . Obesity 08/06/2013  . Hypertension 07/27/2012  . Awareness of heartbeats 07/27/2012  . 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 Perimembranous ventricular septal defect 07/27/2012    Boneta Lucks rPT  03/27/2018, 10:13 AM  [facility] [address] [place], [place], [address] Phone: [phone]   Fax:  [phone]  Name: Mackenzie Patterson MRN: [id number] Date of Birth: 05-31-1971  [place], [place], [address] Phone: [phone]   Fax:  [phone]  Physical Therapy Treatment  Patient Details  Name: Mackenzie Patterson MRN: [id number] Date of Birth: April 23, 1971 Referring Provider (PT): [name]   Encounter Date: 03/27/2018  PT End of Session - 03/27/18 0937    Visit Number  2    Number of Visits  4    Date for PT Re-Evaluation  04/17/18    Authorization Type  MCD request for auth sent,     Authorization Time Period  3 visits through 10/20 approved    PT Start Time  0937   7 min late   PT Stop Time  1015    PT Time Calculation (min)  38 min    Activity Tolerance  Patient tolerated treatment well       Past Medical History:  Diagnosis Date  . Asthma   . Breast discharge 01/10/2017  . Congenital ventricular septal defect   . Diabetes mellitus without complication ([place])   . Heart murmur   . Hypertension   . Obesity   . Pulmonary hypertension (HCC)    mild   . Tricuspid regurgitation   . VSD (ventricular septal defect), perimembranous    Qp:Qs 1.7:1 by catheterization     Past Surgical History:  Procedure Laterality Date  . CARDIAC CATHETERIZATION [name] 10/30/2014   Procedure: Right Heart Cath;  Surgeon: [name], MD;  Location:  [facility] INVASIVE CV LAB CUPID;  Service: Cardiovascular;  Laterality: [name];  . CHOLECYSTECTOMY  2018  . CYST EXCISION    . LEFT AND RIGHT HEART CATHETERIZATION WITH CORONARY ANGIOGRAM [name] 09/11/2013   Procedure: LEFT AND RIGHT HEART CATHETERIZATION WITH CORONARY ANGIOGRAM;  Surgeon: [name], MD;  Location: [facility] CATH LAB;  Service: Cardiovascular;  Laterality: [name];  . RIGHT HEART CATH [name] 09/02/2016   Procedure: Right Heart Cath;  Surgeon: [name], MD;  Location: [place] CV LAB;  Service: Cardiovascular;  Laterality: [name];  . TUBAL LIGATION      There were no vitals filed for this visit.  Subjective Assessment - 03/27/18 0937     Subjective  pt reports no change, she is doing her HEP    Patient Stated Goals  get back walking like she was, was walking about and hour a day - has a cruise next year and wants to use her leg.     Currently in Pain?  Yes    Pain Score  9     Pain Location  Knee    Pain Orientation  Right;Posterior    Pain Descriptors / Indicators  Aching;Throbbing    Pain Type  Acute pain    Pain Onset  More than a month ago    Pain Frequency  Constant    Aggravating Factors   walking and standing    Pain Relieving Factors  ice/heat and OTC meds         [facility] PT Assessment - 03/27/18 0001      Assessment   Medical Diagnosis  Rt knee chondromalacia      AROM   Right Knee Flexion  102   was not pushing her self this was comfortable position for p                  [facility] Adult PT Treatment/Exercise - 03/27/18 0001      Exercises   Exercises  Knee/Hip      Knee/Hip Exercises: Stretches

## 2018-03-30 DIAGNOSIS — Q210 Ventricular septal defect: Secondary | ICD-10-CM | POA: Diagnosis not present

## 2018-03-30 DIAGNOSIS — I10 Essential (primary) hypertension: Secondary | ICD-10-CM | POA: Diagnosis not present

## 2018-04-03 ENCOUNTER — Ambulatory Visit: Payer: Medicaid Other | Admitting: Physical Therapy

## 2018-04-03 ENCOUNTER — Encounter: Payer: Self-pay | Admitting: Physical Therapy

## 2018-04-03 DIAGNOSIS — R262 Difficulty in walking, not elsewhere classified: Secondary | ICD-10-CM | POA: Diagnosis not present

## 2018-04-03 DIAGNOSIS — M25561 Pain in right knee: Secondary | ICD-10-CM | POA: Diagnosis not present

## 2018-04-03 DIAGNOSIS — M25572 Pain in left ankle and joints of left foot: Secondary | ICD-10-CM | POA: Diagnosis not present

## 2018-04-03 DIAGNOSIS — M25672 Stiffness of left ankle, not elsewhere classified: Secondary | ICD-10-CM | POA: Diagnosis not present

## 2018-04-03 DIAGNOSIS — M6281 Muscle weakness (generalized): Secondary | ICD-10-CM

## 2018-04-03 NOTE — Therapy (Signed)
[facility] [address] [place], [place], [address] Phone: [phone]   Fax:  [phone]  Physical Therapy Treatment  Patient Details  Name: Mackenzie Patterson MRN: [id number] Date of Birth: [birth year] Referring Provider (PT): Dr Gershon Mussel   Encounter Date: 04/03/2018  PT End of Session - 04/03/18 1436    Visit Number  3    Number of Visits  4    Date for PT Re-Evaluation  04/17/18    Authorization Time Period  3 visits 9/30 through 10/20 approved    Authorization - Visit Number  2    Authorization - Number of Visits  3    PT Start Time  1019    PT Stop Time  1102    PT Time Calculation (min)  43 min    Activity Tolerance  Patient tolerated treatment well    Behavior During Therapy  [facility] for tasks assessed/performed       Past Medical History:  Diagnosis Date  . Asthma   . Breast discharge 01/10/2017  . Congenital ventricular septal defect   . Diabetes mellitus without complication (HCC)   . Heart murmur   . Hypertension   . Obesity   . Pulmonary hypertension (HCC)    mild   . Tricuspid regurgitation   . VSD (ventricular septal defect), perimembranous    Qp:Qs 1.7:1 by catheterization     Past Surgical History:  Procedure Laterality Date  . CARDIAC CATHETERIZATION [name] 10/30/2014   Procedure: Right Heart Cath;  Surgeon: Peter M Swaziland, MD;  Location: [facility] INVASIVE CV LAB CUPID;  Service: Cardiovascular;  Laterality: [name];  . CHOLECYSTECTOMY  2018  . CYST EXCISION    . LEFT AND RIGHT HEART CATHETERIZATION WITH CORONARY ANGIOGRAM [name] 09/11/2013   Procedure: LEFT AND RIGHT HEART CATHETERIZATION WITH CORONARY ANGIOGRAM;  Surgeon: Micheline Chapman, MD;  Location: [facility] CATH LAB;  Service: Cardiovascular;  Laterality: [name];  . RIGHT HEART CATH [name] 09/02/2016   Procedure: Right Heart Cath;  Surgeon: Lyn Records, MD;  Location: [facility] INVASIVE CV LAB;  Service: Cardiovascular;  Laterality: [name];  . TUBAL LIGATION      There were no  vitals filed for this visit.  Subjective Assessment - 04/03/18 1026    Subjective  Pain changed from anterior knee to posterior knee. i have been doing the exercises.    Currently in Pain?  --   Does not physically demonstrate 9/10 levels of pain,     Pain Score  9     Pain Location  Knee    Pain Orientation  Right;Posterior    Pain Descriptors / Indicators  Throbbing;Sharp   sharp  down shin  3 X a week  after walking  around awhile   Pain Type  Acute pain    Pain Frequency  Constant    Aggravating Factors   sitting in car,  walking longer,  standing    Pain Relieving Factors  ice/  hot/  elevation                       [facility] Adult PT Treatment/Exercise - 04/03/18 0001      Knee/Hip Exercises: Standing   Gait Training  cued for weight shifting, trunk rotations for improved gait,  less limping noted.     Other Standing Knee Exercises  terminal knee extension , green band,  2 positions 10 x each    Other Standing Knee Exercises  wall slides facing wall  10 x each leg moderate cues initially      Knee/Hip Exercises: Seated   Heel Slides  10 reps    Heel Slides Limitations  pillow case      Knee/Hip Exercises: Supine   Quad Sets  5 reps   painful limited motion   Heel Slides  5 reps    Heel Slides Limitations  ROM limited,  slow  guarded      Knee/Hip Exercises: Sidelying   Hip ABduction  10 reps    Other Sidelying Knee/Hip Exercises  10 reps each rt LT, FWD/BWD kicks, CW/CCW cirlces, FWD/BWD toe taps      Knee/Hip Exercises: Prone   Hamstring Curl  10 reps    Hip Extension  10 reps    Other Prone Exercises  terminal knee   / quad sets      Manual Therapy   Manual Therapy  Taping    Manual therapy comments  manual retrograde foam roller and instrument assist hamstrings  tissue softened .  pain decreased,  trigger point released lateral thigh      McConnell  to improve tracking             PT Education - 04/03/18 1435    Education Details  gait,   how to do soft tissue work to ease pain    Person(s) Educated  Patient    Methods  Explanation;Demonstration;Tactile cues;Verbal cues    Comprehension  Verbalized understanding;Returned demonstration       PT Short Term Goals - 04/03/18 1441      PT SHORT TERM GOAL #1   Title  Patient will be independent with basic HEP for lower body .     Baseline  She has been doing some of the exercises    Time  4    Period  Weeks    Status  On-going      PT SHORT TERM GOAL #2   Title  improve LEFS score =/> 9 point improvement,     Time  4    Period  Weeks    Status  Unable to assess      PT SHORT TERM GOAL #3   Title  increase Rt prone quad flexibility =/> 115 degrees    Baseline  at least 90 degrees,  not formally measured.    Time  4    Period  Weeks    Status  On-going      PT SHORT TERM GOAL #4   Title  improve Rt knee and hip strength to allow her to progress to SLS exercises    Time  4    Period  Weeks    Status  Unable to assess        PT Long Term Goals - 03/27/18 0950      PT LONG TERM GOAL #1   Title  to be established at reassessment    Status  On-going            Plan - 04/03/18 1438    Clinical Impression Statement  Patient continues with pain however pain is concentrated posteriorly.  pain was addressed with manual pT and exercise.  Tissue medial distal hamstrings softened .  Gait less antalgic post session.     PT Next Visit Plan  retape and cont strengthening, progress HEP,  She is ready  for request for additional visits.     PT Home Exercise Plan  Patient not issues new exercises today 04/03/2018.  She  has other exercises issued at previous sessions.    Consulted and Agree with Plan of Care  Patient       Patient will benefit from skilled therapeutic intervention in order to improve the following deficits and impairments:     Visit Diagnosis: Acute pain of right knee  Muscle weakness (generalized)     Problem List Patient Active Problem List    Diagnosis Date Noted  . TIA (transient ischemic attack) 10/24/2017  . Menorrhagia 12/12/2016  . Ventricular septal defect (VSD), membranous 09/02/2016  . Pulmonary hypertension, primary (HCC) 08/23/2016  . Chest tightness 08/03/2016  . Cough 08/03/2016  . Dyspnea and respiratory abnormality 04/16/2014  . Tricuspid regurgitation   . Asthma   . VSD (ventricular septal defect), perimembranous 09/03/2013  . Pulmonary hypertension (HCC) 09/03/2013  . Essential hypertension 08/06/2013  . Diabetes mellitus (HCC) 08/06/2013  . Obesity 08/06/2013  . Hypertension 07/27/2012  . Awareness of heartbeats 07/27/2012  . 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 Perimembranous ventricular septal defect 07/27/2012    Macarius Ruark PTA 04/03/2018, 2:44 PM  [facility] [address] [place], [place], [address] Phone: [phone]   Fax:  [phone]  Name: Mackenzie Patterson MRN: [id number] Date of Birth: [birth year]

## 2018-04-06 ENCOUNTER — Ambulatory Visit: Payer: Medicaid Other | Admitting: Obstetrics and Gynecology

## 2018-04-10 ENCOUNTER — Ambulatory Visit: Payer: Medicaid Other | Admitting: Physical Therapy

## 2018-04-10 ENCOUNTER — Encounter: Payer: Self-pay | Admitting: General Practice

## 2018-04-10 DIAGNOSIS — M25572 Pain in left ankle and joints of left foot: Secondary | ICD-10-CM | POA: Diagnosis not present

## 2018-04-10 DIAGNOSIS — M6281 Muscle weakness (generalized): Secondary | ICD-10-CM | POA: Diagnosis not present

## 2018-04-10 DIAGNOSIS — R262 Difficulty in walking, not elsewhere classified: Secondary | ICD-10-CM | POA: Diagnosis not present

## 2018-04-10 DIAGNOSIS — M25672 Stiffness of left ankle, not elsewhere classified: Secondary | ICD-10-CM | POA: Diagnosis not present

## 2018-04-10 DIAGNOSIS — M25561 Pain in right knee: Secondary | ICD-10-CM

## 2018-04-10 NOTE — Therapy (Signed)
[place], [place], [address] Phone: [phone]   Fax:  [phone]  Physical Therapy Treatment/ERO  Patient Details  Name: Mackenzie Patterson MRN: [id number] Date of Birth: [birth year] Referring Provider (PT): [name]   Encounter Date: 04/10/2018  PT End of Session - 04/10/18 0941    Visit Number  4    Number of Visits  12   added 8 to complete 12 total   Date for PT Re-Evaluation  05/22/18    Authorization Type  MCD 2nd reathorization sent 04-10-18    Authorization Time Period  4 visits and asking for 8 addtional visits    Authorization - Visit Number  4    Authorization - Number of Visits  4    PT Start Time  0930    PT Stop Time  1015    PT Time Calculation (min)  45 min    Activity Tolerance  Patient tolerated treatment well    Behavior During Therapy  [facility] for tasks assessed/performed       Past Medical History:  Diagnosis Date  . Asthma   . Breast discharge 01/10/2017  . Congenital ventricular septal defect   . Diabetes mellitus without complication ([place])   . Heart murmur   . Hypertension   . Obesity   . Pulmonary hypertension (HCC)    mild   . Tricuspid regurgitation   . VSD (ventricular septal defect), perimembranous    Qp:Qs 1.7:1 by catheterization     Past Surgical History:  Procedure Laterality Date  . CARDIAC CATHETERIZATION [name] 10/30/2014   Procedure: Right Heart Cath;  Surgeon: [name], MD;  Location: [facility] INVASIVE CV LAB CUPID;  Service: Cardiovascular;  Laterality: [name];  . CHOLECYSTECTOMY  2018  . CYST EXCISION    . LEFT AND RIGHT HEART CATHETERIZATION WITH CORONARY ANGIOGRAM [name] 09/11/2013   Procedure: LEFT AND RIGHT HEART CATHETERIZATION WITH CORONARY ANGIOGRAM;  Surgeon: [name], MD;  Location: [facility] CATH LAB;  Service: Cardiovascular;  Laterality: [name];  . RIGHT HEART CATH [name] 09/02/2016   Procedure: Right Heart Cath;  Surgeon: [name], MD;  Location: [place] CV LAB;  Service: Cardiovascular;  Laterality: [name];  . TUBAL LIGATION      There were no vitals filed for this visit.  Subjective Assessment - 04/10/18 0937    Subjective  I dont' have pain on the front of my knee, I just have it on my lateral right back of knee, It is very tender    How long can you sit comfortably?  has pain if the knee is bent for about 5-10'     How long can you walk comfortably?  I can walk about 60 to 90 minutes    Diagnostic tests  x-rays arthritis    Patient Stated Goals  get back walking like she was, was walking about and hour a day - has a cruise next year and wants to use her leg.     Currently in Pain?  Yes    Pain Score  8     Pain Location  Knee    Pain Orientation  Posterior;Right         [facility] PT Assessment - 04/10/18 0951      Assessment   Medical Diagnosis  Rt knee chondromalacia    Referring Provider (PT)  [name]      Observation/Other Assessments   Other Surveys   Other Surveys  Lower Extremity Functional Scale   38      AROM   Right Knee Extension  0    Right Knee Flexion  130   110 prone then pain verbalized by patient pain in AROM/PROM    Left Knee Extension  0    Left Knee Flexion  145      Strength   Right Hip Flexion  4+/5   pain around knee jt   Right Hip Extension  --   5-/5   Right Hip ABduction  4/5    Right Knee Flexion  4/5   pain at HS insertion area not tolerating resistance   Right Knee Extension  5/5      Palpation   Palpation comment  tenderness to palpation                   [facility] Adult PT Treatment/Exercise - 04/10/18 1000      Knee/Hip Exercises: Standing   Lateral Step Up  Right;10 reps;2 sets;Step Height: 6";Hand Hold: 1    Forward Step Up  Right;3 sets;10 reps;Step Height: 6";Hand Hold: 1    Step Down  Right;3 sets;15 reps;Step Height: 2"   Lt heel taps    SLS  right SLS for 20 sec x 3 with visible co contraction but able to stand    Other Standing Knee Exercises  TKE  green t band       Knee/Hip Exercises: Seated   Other Seated Knee/Hip Exercises  knee ext 2 x 15, flex 2 x 15 right  with red t band resistance      Modalities   Modalities  Iontophoresis      Iontophoresis   Type of Iontophoresis  Dexamethasone    Location  knee right posterior lateral    Dose  1cc    Time  4-6 hours patch to be removed at 4:00 PM today             PT Education - 04/10/18 1005    Education Details  added to HEP, ionotophoresis education and application to right knee    Person(s) Educated  Patient    Methods  Explanation;Demonstration;Tactile cues;Verbal cues;Handout    Comprehension  Verbalized understanding;Returned demonstration       PT Short Term Goals - 04/10/18 0936      PT SHORT TERM GOAL #1   Title  Patient will be independent with basic HEP for lower body .     Baseline  Pt has been doing exercises and has no anterior pain but has 8/10 posterior lateral right knee pain    Time  4    Period  Weeks    Status  Achieved    Target Date  04/17/18      PT SHORT TERM GOAL #2   Title  improve LEFS score =/> 9 point improvement,     Baseline  34 eval.    38    on re eval    Time  4    Period  Weeks    Status  Not Met    Target Date  04/17/18      PT SHORT TERM GOAL #3   Title  increase Rt prone quad flexibility =/> 115 degrees    Baseline  110 partially achieved on left but pain at 110    Time  4    Period  Weeks    Status  Partially Met      PT SHORT TERM GOAL #4  Title  improve Rt knee and hip strength to allow her to progress to SLS exercises    Baseline  grossly 4 to 4+/5 unable to stand more than 20 sec on Rt LE     Time  4    Period  Weeks    Status  Partially Met        PT Long Term Goals - 04/10/18 0941      PT LONG TERM GOAL #1   Title  Pt will be independent with advanced HEP    Baseline  Pt will be able to tolerate standing and advanced HEP    Time  6    Period  Weeks    Status  New    Target Date  05/22/18       PT LONG TERM GOAL #2   Title  Pt will be able to flex right knee in prone to 130 degrees     Baseline  Pt was 110 today on right . Left is 130    Time  6    Period  Weeks    Status  New      PT LONG TERM GOAL #3   Title  improve LEFS score =/> 9 point improvement,     Baseline  LEF on 04-10-18 38 today    Time  6    Period  Weeks    Status  New    Target Date  05/22/18      PT LONG TERM GOAL #4   Title  Pt will be able to tape her own knee to correct lateral tracking to be able to stand and exercise as needed    Time  [place] - 04/10/18 9476    Clinical Impression Statement  Pt returns to PT for 4 th clinic visit before re authorization by MCD.  Ms Middlebrooks does not complain of any anterior pain today but 7/10 pain in right lateral hamstring insertion.  Pt was ERO for reauthorization. and has achieved or partially achieved all goals except for LEFS outcome measure which is 38 from 34 . ( 9 points is clinically signiificant.  Pt howeve has made gains in AROM and MMT sincce evaluation . Pt is able to flex R knee to 110 in prone from 90 and increased MMT. Pt is now able to perform standing exercises and would benefit from 8 addtional visits in the next 6 weeks to reinforce and an advanced HEP    Rehab Potential  Good    PT Frequency  2x / week   for total of 12 visits (4 completed and needs 8 addtionals)   PT Duration  6 weeks    PT Treatment/Interventions  Iontophoresis 93m/ml Dexamethasone;Gait training;Stair training;Neuromuscular re-education;Dry needling;Manual techniques;Moist Heat;Therapeutic activities;Patient/family education;Taping;Vasopneumatic Device;Therapeutic exercise;Cryotherapy;Electrical Stimulation;Balance training    PT Next Visit Plan  RE eval done on 04-10-18  assess ionto benefit and then progress in standing HEP    PT Home Exercise Plan  Patient not issues new exercises today 04/03/2018.  She has other exercises issued  at previous sessions.       Patient will benefit from skilled therapeutic intervention in order to improve the following deficits and impairments:  Pain, Decreased mobility, Decreased range of motion, Decreased strength, Impaired flexibility  Visit Diagnosis: Acute pain of right knee  Muscle weakness (generalized)  Stiffness of left ankle,  not elsewhere classified  Pain in left ankle and joints of left foot     Problem List Patient Active Problem List   Diagnosis Date Noted  . TIA (transient ischemic attack) 10/24/2017  . Menorrhagia 12/12/2016  . Ventricular septal defect (VSD), membranous 09/02/2016  . Pulmonary hypertension, primary ([place]) 08/23/2016  . Chest tightness 08/03/2016  . Cough 08/03/2016  . Dyspnea and respiratory abnormality 04/16/2014  . Tricuspid regurgitation   . Asthma   . VSD (ventricular septal defect), perimembranous 09/03/2013  . Pulmonary hypertension ([place]) 09/03/2013  . Essential hypertension 08/06/2013  . Diabetes mellitus ([place]) 08/06/2013  . Obesity 08/06/2013  . Hypertension 07/27/2012  . Awareness of heartbeats 07/27/2012  . 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 Perimembranous ventricular septal defect 07/27/2012   Voncille Lo, PT Certified Exercise Expert for the Aging Adult  04/10/18 1:40 PM Phone: [phone] Fax: [facility] [address] [place], [place], [address] Phone: [phone]   Fax:  [phone]  Name: Mackenzie Patterson MRN: [id number] Date of Birth: 29-Dec-1970

## 2018-04-10 NOTE — Patient Instructions (Signed)
.  Copyright  VHI. All rights reserved.  Step-Up: Lateral   Step up to side with right leg. Bring other foot up onto _6___ inch step. Return to floor position with left leg. Repeat _10-2___ times per session. Do _1-2___ sessions per day. Repeat in dimly lit room. Repeat with eyes closed.  Copyright  VHI. All rights reserved.  Forward   Facing step, place one leg on step, flexed at hip. Step up slowly, bringing hips in line with knee and shoulder. Bring other foot onto step. Reverse process to step back down. Repeat with other leg. Do _10___ repetitions, _2-3___ sets.  Knee Flexion: Resisted (Sitting)    Sit with band under left foot and looped around ankle of supported leg. Pull unsupported leg back. Repeat _15___ times per set. Do ___2_ sets per session. Do __1-2__ sessions per day.  http://orth.exer.us/695   Copyright  VHI. All rights reserved.  Knee Extension: Resisted (Sitting)    With band looped around right ankle and under other foot, straighten leg with ankle loop. Keep other leg bent to increase resistance. Repeat __15__ times per set. Do _2___ sets per session. Do _1-2___ sessions per day.  http://orth.exer.us/691   Copyright  VHI. All rights reserved.    IONTOPHORESIS PATIENT PRECAUTIONS & CONTRAINDICATIONS:  . Redness under one or both electrodes can occur.  This characterized by a uniform redness that usually disappears within 12 hours of treatment. . Small pinhead size blisters may result in response to the drug.  Contact your physician if the problem persists more than 24 hours. . On rare occasions, iontophoresis therapy can result in temporary skin reactions such as rash, inflammation, irritation or burns.  The skin reactions may be the result of individual sensitivity to the ionic solution used, the condition of the skin at the start of treatment, reaction to the materials in the electrodes, allergies or sensitivity to dexamethasone, or a poor  connection between the patch and your skin.  Discontinue using iontophoresis if you have any of these reactions and report to your therapist. . Remove the Patch or electrodes if you have any undue sensation of pain or burning during the treatment and report discomfort to your therapist. . Tell your Therapist if you have had known adverse reactions to the application of electrical current. . If using the Patch, the LED light will turn off when treatment is complete and the patch can be removed.  Approximate treatment time is 1-3 hours.  Remove the patch when light goes off or after 6 hours. . The Patch can be worn during normal activity, however excessive motion where the electrodes have been placed can cause poor contact between the skin and the electrode or uneven electrical current resulting in greater risk of skin irritation. Marland Kitchen Keep out of the reach of children.   . DO NOT use if you have a cardiac pacemaker or any other electrically sensitive implanted device. . DO NOT use if you have a known sensitivity to dexamethasone. . DO NOT use during Magnetic Resonance Imaging (MRI). . DO NOT use over broken or compromised skin (e.g. sunburn, cuts, or acne) due to the increased risk of skin reaction. . DO NOT SHAVE over the area to be treated:  To establish good contact between the Patch and the skin, excessive hair may be clipped. . DO NOT place the Patch or electrodes on or over your eyes, directly over your heart, or brain. . DO NOT reuse the Patch or electrodes as this may cause  burns to occur.  Voncille Lo, PT Certified Exercise Expert for the Aging Adult  04/10/18 10:04 AM Phone: [phone] Fax: [phone] Terminal Knee Extension (Standing)    Facing anchor with right knee slightly bent and tubing just above knee, gently pull knee back straight. Do not overextend knee. Repeat __15__ times per set. Do _2___ sets per session. Do ___1-2_ sessions per  day.  http://orth.exer.us/667   Copyright  VHI. All rights reserved.  Voncille Lo, PT Certified Exercise Expert for the Aging Adult  04/10/18 10:05 AM Phone: [phone] Fax: [phone]

## 2018-04-11 ENCOUNTER — Ambulatory Visit: Payer: Medicaid Other | Admitting: Physical Therapy

## 2018-04-12 ENCOUNTER — Ambulatory Visit (INDEPENDENT_AMBULATORY_CARE_PROVIDER_SITE_OTHER): Payer: Medicaid Other | Admitting: Physician Assistant

## 2018-04-12 ENCOUNTER — Other Ambulatory Visit: Payer: Self-pay

## 2018-04-12 ENCOUNTER — Encounter (INDEPENDENT_AMBULATORY_CARE_PROVIDER_SITE_OTHER): Payer: Self-pay | Admitting: Physician Assistant

## 2018-04-12 VITALS — BP 167/97 | HR 98 | Temp 97.8°F | Ht 59.0 in | Wt 165.0 lb

## 2018-04-12 DIAGNOSIS — G4700 Insomnia, unspecified: Secondary | ICD-10-CM

## 2018-04-12 DIAGNOSIS — F418 Other specified anxiety disorders: Secondary | ICD-10-CM

## 2018-04-12 DIAGNOSIS — E119 Type 2 diabetes mellitus without complications: Secondary | ICD-10-CM

## 2018-04-12 DIAGNOSIS — I10 Essential (primary) hypertension: Secondary | ICD-10-CM

## 2018-04-12 DIAGNOSIS — Z91199 Patient's noncompliance with other medical treatment and regimen due to unspecified reason: Secondary | ICD-10-CM

## 2018-04-12 DIAGNOSIS — F5089 Other specified eating disorder: Secondary | ICD-10-CM | POA: Diagnosis not present

## 2018-04-12 DIAGNOSIS — Z9119 Patient's noncompliance with other medical treatment and regimen: Secondary | ICD-10-CM

## 2018-04-12 LAB — POCT GLYCOSYLATED HEMOGLOBIN (HGB A1C): Hemoglobin A1C: 5.7 % — AB (ref 4.0–5.6)

## 2018-04-12 MED ORDER — LISINOPRIL 20 MG PO TABS: 20.0000 mg | ORAL_TABLET | Freq: Every day | ORAL | 3 refills | Status: DC

## 2018-04-12 MED ORDER — METFORMIN HCL ER 500 MG PO TB24: 500.0000 mg | ORAL_TABLET | Freq: Every day | ORAL | 1 refills | Status: DC

## 2018-04-12 MED ORDER — TRAZODONE HCL 50 MG PO TABS: 100.0000 mg | ORAL_TABLET | Freq: Every day | ORAL | 3 refills | Status: DC

## 2018-04-12 MED ORDER — GLUCOSE BLOOD VI STRP: ORAL_STRIP | 5 refills | Status: DC

## 2018-04-12 MED ORDER — ASPIRIN EC 81 MG PO TBEC: 81.0000 mg | DELAYED_RELEASE_TABLET | Freq: Every day | ORAL | 3 refills | Status: DC

## 2018-04-12 MED ORDER — AMLODIPINE BESYLATE 5 MG PO TABS: 5.0000 mg | ORAL_TABLET | Freq: Every day | ORAL | 1 refills | Status: DC

## 2018-04-12 MED ORDER — ACCU-CHEK FASTCLIX LANCETS MISC: 2 refills | Status: DC

## 2018-04-12 NOTE — Progress Notes (Signed)
Subjective:  [name], female    DOB: Feb 01, 1971  Age: 47 y.o. MRN: [id number]  CC: f/u depression with anxiety  HPI  Mackenzie Patterson a 47 y.o.femalewith a medical history of asthma, congenital VSD(managed by [facility]), DM2, heart murmur, HTN,TIA,obesity, pulmonary HTN, and tricuspid regurgitation presents to f/u on depression with anxiety. Symptoms included anorexia, insomnia, anhedonia, isolation, irritability, and fear of losing disability benefits. All these symptoms persist. PHQ9 9 and GAD7 12 six weeks ago. PHQ9 9 and GAD7 5 today. Stopped escitalopram on her own accord 4-6 weeks ago due to ineffectiveness.     Has not been taking her anti-glycemics and antihypertensives because she says her BP and glucose are fine. Has seen blood sugar levels reach 47 and 60s while on Janumet. She reports having changed her diet to a low carb diet and says the change in diet has caused her blood sugar to drop. Feels current diabetic treatment regimen is now too strong. Does not endorse any other symptoms or complaints.      Outpatient Medications Prior to Visit  Medication Sig Dispense Refill  . ACCU-CHEK FASTCLIX LANCETS MISC USE 2 TIMES DAILY AS DIRECTED  10  . ACCU-CHEK FASTCLIX LANCETS MISC USE 2 TIMES DAILY AS DIRECTED (Patient not taking: Reported on 04/12/2018) 100 each 5  . ACCU-CHEK GUIDE test strip USE 2 TIMES DAILY AS DIRECFTED (Patient not taking: Reported on 04/12/2018) 100 each 5  . amLODipine (NORVASC) 5 MG tablet Take 5 mg by mouth daily.    Marland Kitchen aspirin EC 325 MG EC tablet Take 1 tablet (325 mg total) by mouth daily. (Patient not taking: Reported on 03/20/2018) 30 tablet 0  . atorvastatin (LIPITOR) 80 MG tablet Take 1 tablet (80 mg total) by mouth daily at 6 PM. (Patient not taking: Reported on 04/12/2018) 30 tablet 0  . Blood Glucose Monitoring Suppl (ACCU-CHEK AVIVA PLUS) w/Device KIT 1 kit by Does not apply route daily. (Patient not taking: Reported on 04/12/2018)  1 kit 0  . carvedilol (COREG) 3.125 MG tablet Take 3.125 mg by mouth 2 (two) times daily.    . diclofenac sodium (VOLTAREN) 1 % GEL Apply 2 g topically 4 (four) times daily. (Patient not taking: Reported on 03/20/2018) 1 Tube 5  . escitalopram (LEXAPRO) 10 MG tablet Take 1 tablet (10 mg total) by mouth daily. (Patient not taking: Reported on 04/12/2018) 30 tablet 2  . hydrOXYzine (ATARAX/VISTARIL) 25 MG tablet Take 1 tablet (25 mg total) by mouth at bedtime. (Patient not taking: Reported on 03/20/2018) 30 tablet 0  . meloxicam (MOBIC) 7.5 MG tablet Take 1 tablet (7.5 mg total) by mouth 2 (two) times daily as needed for pain. (Patient not taking: Reported on 04/12/2018) 30 tablet 2  . methocarbamol (ROBAXIN) 500 MG tablet Take 1 tablet (500 mg total) by mouth 2 (two) times daily. (Patient not taking: Reported on 04/12/2018) 30 tablet 0  . sitaGLIPtin-metformin (JANUMET) 50-1000 MG tablet Take 1 tablet by mouth 2 (two) times daily with a meal. (Patient not taking: Reported on 04/12/2018) 60 tablet 11   No facility-administered medications prior to visit.      ROS Review of Systems  Constitutional: Positive for malaise/fatigue. Negative for chills and fever.  Eyes: Negative for blurred vision.  Respiratory: Negative for shortness of breath.   Cardiovascular: Negative for chest pain and palpitations.  Gastrointestinal: Negative for abdominal pain and nausea.  Genitourinary: Negative for dysuria and hematuria.  Musculoskeletal: Negative for joint pain and myalgias.  Skin: Negative for rash.  Neurological: Negative for tingling and headaches.  Psychiatric/Behavioral: Positive for depression. The patient is nervous/anxious.     Objective:  BP (!) 167/97 (BP Location: Left Arm, Patient Position: Sitting, Cuff Size: Normal)   Pulse 98   Temp 97.8 F (36.6 C) (Oral)   Ht '4\' 11"'  (1.499 m)   Wt 165 lb (74.8 kg)   LMP 04/08/2018 (Approximate)   SpO2 97%   BMI 33.33 kg/m   BP/Weight 04/12/2018  02/27/2018 1/60/1093  Systolic BP 235 573 220  Diastolic BP 97 87 90  Wt. (Lbs) 165 167 166.8  BMI 33.33 33.73 33.69      Physical Exam  Constitutional: She is oriented to person, place, and time. She appears well-developed and well-nourished. No distress.   eating ice cubes  HENT:  Right Ear: Hearing normal. No tenderness. Tympanic membrane is injected and erythematous. Tympanic membrane is not retracted and not bulging.  Left Ear: Hearing normal. No tenderness. Tympanic membrane is injected and erythematous. Tympanic membrane is not retracted and not bulging.  Nose: Rhinorrhea present. No sinus tenderness. No epistaxis. Right sinus exhibits no maxillary sinus tenderness and no frontal sinus tenderness. Left sinus exhibits no maxillary sinus tenderness and no frontal sinus tenderness.  Mouth/Throat: Uvula is midline. Posterior oropharyngeal erythema present.  Eyes: Pupils are equal, round, and reactive to light. EOM are normal. Right conjunctiva is not injected. Left conjunctiva is not injected. No scleral icterus.  Neck: Normal range of motion. Neck supple. No tracheal deviation, no edema and no erythema present. No Kernig's sign noted. No thyromegaly present.  Cardiovascular: Normal rate and regular rhythm.  Murmur (3/6 holosystolic murmur) heard. Pulmonary/Chest: Effort normal and breath sounds normal.  Lymphadenopathy:    She has no cervical adenopathy.  Neurological: She is alert and oriented to person, place, and time.  Skin: Skin is warm, dry and intact. She is not diaphoretic.  Psychiatric:  Reserved, somewhat depressed, flat affect     Assessment & Plan:   1. Type 2 diabetes mellitus without complication, without long-term current use of insulin (HCC) - HgB A1c 5.7% today - Lipid panel - Comprehensive metabolic panel - Begin metFORMIN (GLUCOPHAGE XR) 500 MG 24 hr tablet; Take 1 tablet (500 mg total) by mouth daily with breakfast.  Dispense: 90 tablet; Refill: 1 -  ACCU-CHEK FASTCLIX LANCETS MISC; USE 2 TIMES DAILY AS DIRECTED  Dispense: 102 each; Refill: 2  2. Pica - CBC with Differential  3. Depression with anxiety - Begin traZODone (DESYREL) 50 MG tablet; Take 2 tablets (100 mg total) by mouth at bedtime.  Dispense: 60 tablet; Refill: 3 - glucose blood (ACCU-CHEK GUIDE) test strip; USE 2 TIMES DAILY AS DIRECFTED  Dispense: 70 each; Refill: 5  4. Insomnia, unspecified type - Begin traZODone (DESYREL) 50 MG tablet; Take 2 tablets (100 mg total) by mouth at bedtime.  Dispense: 60 tablet; Refill: 3 - glucose blood (ACCU-CHEK GUIDE) test strip; USE 2 TIMES DAILY AS DIRECFTED  Dispense: 70 each; Refill: 5  5. Hypertension, unspecified type - Refill amLODipine (NORVASC) 5 MG tablet; Take 1 tablet (5 mg total) by mouth daily.  Dispense: 90 tablet; Refill: 1 - Decrease aspirin EC 81 MG tablet; Take 1 tablet (81 mg total) by mouth daily.  Dispense: 90 tablet; Refill: 3 - Begin lisinopril (PRINIVIL,ZESTRIL) 20 MG tablet; Take 1 tablet (20 mg total) by mouth daily.  Dispense: 90 tablet; Refill: 3  6. Noncompliance - Pt counseled to take medications as prescribed. She  is to call if there is any change/side effects while taking medications.    Meds ordered this encounter  Medications  . metFORMIN (GLUCOPHAGE XR) 500 MG 24 hr tablet    Sig: Take 1 tablet (500 mg total) by mouth daily with breakfast.    Dispense:  90 tablet    Refill:  1    Order Specific Question:   Supervising Provider    Answer:   Charlott Rakes [[id number]]  . traZODone (DESYREL) 50 MG tablet    Sig: Take 2 tablets (100 mg total) by mouth at bedtime.    Dispense:  60 tablet    Refill:  3    Order Specific Question:   Supervising Provider    Answer:   Charlott Rakes [[id number]]  . glucose blood (ACCU-CHEK GUIDE) test strip    Sig: USE 2 TIMES DAILY AS DIRECFTED    Dispense:  70 each    Refill:  5    E11.10    Order Specific Question:   Supervising Provider    Answer:   Charlott Rakes  [[id number]]  . amLODipine (NORVASC) 5 MG tablet    Sig: Take 1 tablet (5 mg total) by mouth daily.    Dispense:  90 tablet    Refill:  1    Order Specific Question:   Supervising Provider    Answer:   Charlott Rakes [[id number]]  . aspirin EC 81 MG tablet    Sig: Take 1 tablet (81 mg total) by mouth daily.    Dispense:  90 tablet    Refill:  3    Order Specific Question:   Supervising Provider    Answer:   Charlott Rakes [[id number]]  . ACCU-CHEK FASTCLIX LANCETS MISC    Sig: USE 2 TIMES DAILY AS DIRECTED    Dispense:  102 each    Refill:  2    ICD10 - E11.9    Order Specific Question:   Supervising Provider    Answer:   Charlott Rakes [[id number]]  . lisinopril (PRINIVIL,ZESTRIL) 20 MG tablet    Sig: Take 1 tablet (20 mg total) by mouth daily.    Dispense:  90 tablet    Refill:  3    Order Specific Question:   Supervising Provider    Answer:   Charlott Rakes [[id number]]    Follow-up: Return in about 4 weeks (around 05/10/2018) for HTN and Pica.   Clent Demark PA

## 2018-04-12 NOTE — Patient Instructions (Signed)

## 2018-04-13 ENCOUNTER — Telehealth (INDEPENDENT_AMBULATORY_CARE_PROVIDER_SITE_OTHER): Payer: Self-pay

## 2018-04-13 ENCOUNTER — Other Ambulatory Visit (INDEPENDENT_AMBULATORY_CARE_PROVIDER_SITE_OTHER): Payer: Self-pay | Admitting: Physician Assistant

## 2018-04-13 ENCOUNTER — Encounter (INDEPENDENT_AMBULATORY_CARE_PROVIDER_SITE_OTHER): Payer: Self-pay

## 2018-04-13 DIAGNOSIS — E781 Pure hyperglyceridemia: Secondary | ICD-10-CM

## 2018-04-13 DIAGNOSIS — D509 Iron deficiency anemia, unspecified: Secondary | ICD-10-CM

## 2018-04-13 LAB — COMPREHENSIVE METABOLIC PANEL
A/G RATIO: 1.5 (ref 1.2–2.2)
ALBUMIN: 4.3 g/dL (ref 3.5–5.5)
ALT: 12 IU/L (ref 0–32)
AST: 19 IU/L (ref 0–40)
Alkaline Phosphatase: 81 IU/L (ref 39–117)
BUN / CREAT RATIO: 13 (ref 9–23)
BUN: 11 mg/dL (ref 6–24)
Bilirubin Total: 0.4 mg/dL (ref 0.0–1.2)
CALCIUM: 9.2 mg/dL (ref 8.7–10.2)
CO2: 22 mmol/L (ref 20–29)
Chloride: 103 mmol/L (ref 96–106)
Creatinine, Ser: 0.84 mg/dL (ref 0.57–1.00)
GFR, EST AFRICAN AMERICAN: 96 mL/min/{1.73_m2} (ref >59)
GFR, EST NON AFRICAN AMERICAN: 84 mL/min/{1.73_m2} (ref >59)
Globulin, Total: 2.9 g/dL (ref 1.5–4.5)
Glucose: 228 mg/dL — ABNORMAL HIGH (ref 65–99)
POTASSIUM: 4.3 mmol/L (ref 3.5–5.2)
Sodium: 140 mmol/L (ref 134–144)
Total Protein: 7.2 g/dL (ref 6.0–8.5)

## 2018-04-13 LAB — CBC WITH DIFFERENTIAL/PLATELET
BASOS ABS: 0 10*3/uL (ref 0.0–0.2)
Basos: 0 %
EOS (ABSOLUTE): 0.1 10*3/uL (ref 0.0–0.4)
Eos: 2 %
Hematocrit: 32.3 % — ABNORMAL LOW (ref 34.0–46.6)
Hemoglobin: 9.4 g/dL — ABNORMAL LOW (ref 11.1–15.9)
IMMATURE GRANULOCYTES: 0 %
Immature Grans (Abs): 0 10*3/uL (ref 0.0–0.1)
LYMPHS: 23 %
Lymphocytes Absolute: 1.1 10*3/uL (ref 0.7–3.1)
MCH: 21.3 pg — ABNORMAL LOW (ref 26.6–33.0)
MCHC: 29.1 g/dL — ABNORMAL LOW (ref 31.5–35.7)
MCV: 73 fL — AB (ref 79–97)
MONOS ABS: 0.4 10*3/uL (ref 0.1–0.9)
Monocytes: 9 %
NEUTROS PCT: 66 %
Neutrophils Absolute: 3.1 10*3/uL (ref 1.4–7.0)
PLATELETS: 289 10*3/uL (ref 150–450)
RBC: 4.42 x10E6/uL (ref 3.77–5.28)
RDW: 17.1 % — AB (ref 12.3–15.4)
WBC: 4.7 10*3/uL (ref 3.4–10.8)

## 2018-04-13 LAB — LIPID PANEL
CHOL/HDL RATIO: 3.5 ratio (ref 0.0–4.4)
CHOLESTEROL TOTAL: 151 mg/dL (ref 100–199)
HDL: 43 mg/dL (ref >39)
LDL Calculated: 77 mg/dL (ref 0–99)
TRIGLYCERIDES: 156 mg/dL — AB (ref 0–149)
VLDL Cholesterol Cal: 31 mg/dL (ref 5–40)

## 2018-04-13 MED ORDER — ATORVASTATIN CALCIUM 20 MG PO TABS: 20.0000 mg | ORAL_TABLET | Freq: Every day | ORAL | 3 refills | Status: DC

## 2018-04-13 MED ORDER — FERROUS SULFATE 325 (65 FE) MG PO TBEC: 325.0000 mg | DELAYED_RELEASE_TABLET | Freq: Three times a day (TID) | ORAL | 1 refills | Status: DC

## 2018-04-13 NOTE — Telephone Encounter (Signed)
Patient is aware that she is anemic and that her triglycerides are mildly elevated. Atorvastatin and iron pills have been sent to [facility]. Nat Christen, [place]

## 2018-04-13 NOTE — Telephone Encounter (Signed)
-----   Message from Clent Demark, PA-C sent at 04/13/2018 12:19 PM EDT ----- Anemic and Triglycerides are mildly elevated. Atorvastatin and Iron pills sent to [facility].

## 2018-04-16 ENCOUNTER — Encounter: Payer: Self-pay | Admitting: Physical Therapy

## 2018-04-16 ENCOUNTER — Ambulatory Visit: Payer: Medicaid Other | Admitting: Physical Therapy

## 2018-04-16 DIAGNOSIS — M25561 Pain in right knee: Secondary | ICD-10-CM

## 2018-04-16 DIAGNOSIS — M6281 Muscle weakness (generalized): Secondary | ICD-10-CM

## 2018-04-16 DIAGNOSIS — R262 Difficulty in walking, not elsewhere classified: Secondary | ICD-10-CM | POA: Diagnosis not present

## 2018-04-16 DIAGNOSIS — M25672 Stiffness of left ankle, not elsewhere classified: Secondary | ICD-10-CM | POA: Diagnosis not present

## 2018-04-16 DIAGNOSIS — M25572 Pain in left ankle and joints of left foot: Secondary | ICD-10-CM | POA: Diagnosis not present

## 2018-04-16 NOTE — Therapy (Signed)
[facility] [address] [place], [place], [address] Phone: [phone]   Fax:  [phone]  Physical Therapy Treatment  Patient Details  Name: Mackenzie Patterson MRN: [id number] Date of Birth: 1970/09/13 Referring Provider (PT): Dr Gershon Mussel   Encounter Date: 04/16/2018  PT End of Session - 04/16/18 1758    Visit Number  5    Number of Visits  12    Date for PT Re-Evaluation  05/22/18    Authorization Type  MCD 2nd-  04/16/2018 through  05/27/2018  12 visits    Authorization Time Period  04/16/2018 through 05/27/2018  12 visits(  used 4 visits,  asked for 8 more need to clarify if 8 more were issued or if 12 more were issued)     Authorization - Visit Number  1    Authorization - Number of Visits  12    PT Start Time  1103    PT Stop Time  1145    PT Time Calculation (min)  42 min    Activity Tolerance  Patient tolerated treatment well    Behavior During Therapy  [facility] for tasks assessed/performed       Past Medical History:  Diagnosis Date  . Asthma   . Breast discharge 01/10/2017  . Congenital ventricular septal defect   . Diabetes mellitus without complication (HCC)   . Heart murmur   . Hypertension   . Obesity   . Pulmonary hypertension (HCC)    mild   . Tricuspid regurgitation   . VSD (ventricular septal defect), perimembranous    Qp:Qs 1.7:1 by catheterization     Past Surgical History:  Procedure Laterality Date  . CARDIAC CATHETERIZATION [name] 10/30/2014   Procedure: Right Heart Cath;  Surgeon: Peter M Swaziland, MD;  Location: [facility] INVASIVE CV LAB CUPID;  Service: Cardiovascular;  Laterality: [name];  . CHOLECYSTECTOMY  2018  . CYST EXCISION    . LEFT AND RIGHT HEART CATHETERIZATION WITH CORONARY ANGIOGRAM [name] 09/11/2013   Procedure: LEFT AND RIGHT HEART CATHETERIZATION WITH CORONARY ANGIOGRAM;  Surgeon: Micheline Chapman, MD;  Location: [place] CATH LAB;  Service: Cardiovascular;  Laterality: [name];  . RIGHT HEART CATH [name]  09/02/2016   Procedure: Right Heart Cath;  Surgeon: Lyn Records, MD;  Location: [facility] INVASIVE CV LAB;  Service: Cardiovascular;  Laterality: [name];  . TUBAL LIGATION      There were no vitals filed for this visit.  Subjective Assessment - 04/16/18 1110    Subjective  I do OK walking  it hurts when i stop.  I am stiff in am.    I am doing the exercises.  Able to walk  now with less pain.  Patch fell off in the parking lot.      Currently in Pain?  Yes    Pain Score  7    up to 10   Pain Location  Knee    Pain Orientation  Right;Posterior;Anterior    Pain Descriptors / Indicators  Sharp;Throbbing    Pain Type  Acute pain    Pain Frequency  Intermittent    Aggravating Factors   sitting in car    Pain Relieving Factors  ice,  heat,  elevate,  exercise    Multiple Pain Sites  No                       [facility] Adult PT Treatment/Exercise - 04/16/18 0001      Knee/Hip Exercises: Standing  Heel Raises  20 reps;Both    Forward Step Up  1 set;10 reps;Step Height: 6"    Forward Step Up Limitations  cued to avoid knee lock into hyperextension    Wall Squat  1 set;10 reps    Wall Squat Limitations  small motions,  ball squeeze and cued to press heels    Other Standing Knee Exercises  terminal knee extension ball against wall.  min cues initially    Other Standing Knee Exercises  Monster walking without band added to HEP after practice.  good technique      Knee/Hip Exercises: Seated   Sit to Sand  10 reps      Modalities   Modalities  Iontophoresis      Iontophoresis   Type of Iontophoresis  Dexamethasone    Location  knee right posterior lateral    Dose  1cc    Time  4-6 hour patch      Manual Therapy   Manual therapy comments  retrograde soft tissue worl,  foam roller and manual,  distal hamstrings.  tight tissue softened,  verbal instructions on how she can do if needed at home.              PT Education - 04/16/18 1757    Education Details  HEP    Person(s)  Educated  Patient    Methods  Explanation;Demonstration;Tactile cues;Verbal cues;Handout    Comprehension  Returned demonstration;Verbalized understanding       PT Short Term Goals - 04/16/18 1805      PT SHORT TERM GOAL #1   Title  Patient will be independent with basic HEP for lower body .     Time  4    Period  Weeks    Status  Achieved      PT SHORT TERM GOAL #2   Title  improve LEFS score =/> 9 point improvement,     Time  4    Period  Months    Status  Unable to assess      PT SHORT TERM GOAL #3   Title  increase Rt prone quad flexibility =/> 115 degrees    Period  Weeks    Status  Unable to assess      PT SHORT TERM GOAL #4   Title  improve Rt knee and hip strength to allow her to progress to SLS exercises    Time  4    Period  Weeks    Status  Unable to assess        PT Long Term Goals - 04/10/18 0941      PT LONG TERM GOAL #1   Title  Pt will be independent with advanced HEP    Baseline  Pt will be able to tolerate standing and advanced HEP    Time  6    Period  Weeks    Status  New    Target Date  05/22/18      PT LONG TERM GOAL #2   Title  Pt will be able to flex right knee in prone to 130 degrees     Baseline  Pt was 110 today on right . Left is 130    Time  6    Period  Weeks    Status  New      PT LONG TERM GOAL #3   Title  improve LEFS score =/> 9 point improvement,     Baseline  LEF on 04-10-18 38 today  Time  6    Period  Weeks    Status  New    Target Date  05/22/18      PT LONG TERM GOAL #4   Title  Pt will be able to tape her own knee to correct lateral tracking to be able to stand and exercise as needed    Time  6    Period  Weeks    Status  New            Plan - 04/16/18 1803    Clinical Impression Statement  Ionto applied horizontal vs vertical to see if it lasted longer.  manual and exercises are helpful and she was able to exercise with mild increases of pain. progress toward HEP goal.  transistioned to more closed  chain exercises today    PT Next Visit Plan  assess ionto benifit.  Continue standing HEP    PT Home Exercise Plan   She has other exercises issued at previous sessions.  Monster walk no band.     Consulted and Agree with Plan of Care  Patient       Patient will benefit from skilled therapeutic intervention in order to improve the following deficits and impairments:     Visit Diagnosis: Acute pain of right knee  Muscle weakness (generalized)     Problem List Patient Active Problem List   Diagnosis Date Noted  . TIA (transient ischemic attack) 10/24/2017  . Menorrhagia 12/12/2016  . Ventricular septal defect (VSD), membranous 09/02/2016  . Pulmonary hypertension, primary (HCC) 08/23/2016  . Chest tightness 08/03/2016  . Cough 08/03/2016  . Dyspnea and respiratory abnormality 04/16/2014  . Tricuspid regurgitation   . Asthma   . VSD (ventricular septal defect), perimembranous 09/03/2013  . Pulmonary hypertension (HCC) 09/03/2013  . Essential hypertension 08/06/2013  . Diabetes mellitus (HCC) 08/06/2013  . Obesity 08/06/2013  . Hypertension 07/27/2012  . Awareness of heartbeats 07/27/2012  . 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 Perimembranous ventricular septal defect 07/27/2012    Zelma Snead PTA 04/16/2018, 6:08 PM  [facility] [address] [place], [place], [address] Phone: [phone]   Fax:  [phone]  Name: Quinnie Salvage MRN: [id number] Date of Birth: 05/04/1971

## 2018-04-16 NOTE — Patient Instructions (Signed)
Monster walking:   Stand with knees slightly bent.  Step forward , then to side  Before placing on ground,  Keeping knee slightly bent,, repeat with the other leg.     Walk  10 to 15 feet.     Building to 3-4 X Every other day.    This should not hurt.

## 2018-04-20 ENCOUNTER — Encounter

## 2018-04-24 ENCOUNTER — Ambulatory Visit: Payer: Medicaid Other | Admitting: Physical Therapy

## 2018-04-24 DIAGNOSIS — M6281 Muscle weakness (generalized): Secondary | ICD-10-CM

## 2018-04-24 DIAGNOSIS — M25561 Pain in right knee: Secondary | ICD-10-CM

## 2018-04-24 DIAGNOSIS — M25672 Stiffness of left ankle, not elsewhere classified: Secondary | ICD-10-CM | POA: Diagnosis not present

## 2018-04-24 DIAGNOSIS — R262 Difficulty in walking, not elsewhere classified: Secondary | ICD-10-CM | POA: Diagnosis not present

## 2018-04-24 DIAGNOSIS — M25572 Pain in left ankle and joints of left foot: Secondary | ICD-10-CM | POA: Diagnosis not present

## 2018-04-24 NOTE — Therapy (Signed)
[place], [place], [address] Phone: [phone]   Fax:  [phone]  Physical Therapy Treatment  Patient Details  Name: Mackenzie Patterson MRN: [id number] Date of Birth: June 24, 1971 Referring Provider (PT): [name]   Encounter Date: 04/24/2018  PT End of Session - 04/24/18 1108    Visit Number  6    Number of Visits  12    Date for PT Re-Evaluation  05/22/18    Authorization Type  MCD 2nd-  04/16/2018 through  05/27/2018  12 visits    Authorization Time Period  04/16/2018 through 05/27/2018  12 visits(  used 4 visits,  asked for 8 more need to clarify if 8 more were issued or if 12 more were issued)     Authorization - Visit Number  6    Authorization - Number of Visits  12    PT Start Time  1103    PT Stop Time  1142    PT Time Calculation (min)  39 min    Activity Tolerance  Patient tolerated treatment well    Behavior During Therapy  [facility] for tasks assessed/performed       Past Medical History:  Diagnosis Date  . Asthma   . Breast discharge 01/10/2017  . Congenital ventricular septal defect   . Diabetes mellitus without complication ([place])   . Heart murmur   . Hypertension   . Obesity   . Pulmonary hypertension (HCC)    mild   . Tricuspid regurgitation   . VSD (ventricular septal defect), perimembranous    Qp:Qs 1.7:1 by catheterization     Past Surgical History:  Procedure Laterality Date  . CARDIAC CATHETERIZATION [name] 10/30/2014   Procedure: Right Heart Cath;  Surgeon: [name], MD;  Location: [facility] INVASIVE CV LAB CUPID;  Service: Cardiovascular;  Laterality: [name];  . CHOLECYSTECTOMY  2018  . CYST EXCISION    . LEFT AND RIGHT HEART CATHETERIZATION WITH CORONARY ANGIOGRAM [name] 09/11/2013   Procedure: LEFT AND RIGHT HEART CATHETERIZATION WITH CORONARY ANGIOGRAM;  Surgeon: [name], MD;  Location: [facility] CATH LAB;  Service: Cardiovascular;  Laterality: [name];  . RIGHT HEART CATH [name]  09/02/2016   Procedure: Right Heart Cath;  Surgeon: [name], MD;  Location: [place] CV LAB;  Service: Cardiovascular;  Laterality: [name];  . TUBAL LIGATION      There were no vitals filed for this visit.  Subjective Assessment - 04/24/18 1105    Subjective  I am stiff in the morning.  I want to try the iontophoresis but it keeps falling off.  I am a 9/10 today when I press on my back of my lateral knee on the right. I used my mothers scooter to get around because of the back knee pain    How long can you sit comfortably?  has pain if the knee is bent for about 5-10'     How long can you stand comfortably?  I stood about 2 to 3 hours and I am in so much pain    How long can you walk comfortably?  I can walk about 2 hours    Diagnostic tests  x-rays arthritis    Patient Stated Goals  get back walking like she was, was walking about and hour a day - has a cruise next year and wants to use her leg.     Pain Score  9     Pain Location  Knee  Pain Orientation  Right;Posterior    Pain Descriptors / Indicators  Sharp;Throbbing    Pain Type  Acute pain    Pain Onset  More than a month ago    Pain Frequency  Intermittent                       [facility] Adult PT Treatment/Exercise - 04/24/18 0001      Knee/Hip Exercises: Standing   Heel Raises  1 set;5 reps   pain in back of knee   Other Standing Knee Exercises  Monster walking without band   difficult to perform due to pain in back of knee     Knee/Hip Exercises: Seated   Sit to Sand  10 reps      Knee/Hip Exercises: Prone   Hamstring Curl  10 reps   must hold hamstring while pt performs. Painful without suppo   Hip Extension  10 reps    Other Prone Exercises  terminal knee   / quad sets   30 x      Modalities   Modalities  Iontophoresis      Iontophoresis   Type of Iontophoresis  Dexamethasone    Location  knee right posterior lateral    Dose  1cc    Time  4-6 hour patch      Manual Therapy   Manual  Therapy  Soft tissue mobilization    Manual therapy comments  retrograde soft tissue but pt TTP crease of right knee and right lateral hamstring distal belly of mx               PT Short Term Goals - 04/16/18 1805      PT SHORT TERM GOAL #1   Title  Patient will be independent with basic HEP for lower body .     Time  4    Period  Weeks    Status  Achieved      PT SHORT TERM GOAL #2   Title  improve LEFS score =/> 9 point improvement,     Time  4    Period  Months    Status  Unable to assess      PT SHORT TERM GOAL #3   Title  increase Rt prone quad flexibility =/> 115 degrees    Period  Weeks    Status  Unable to assess      PT SHORT TERM GOAL #4   Title  improve Rt knee and hip strength to allow her to progress to SLS exercises    Time  4    Period  Weeks    Status  Unable to assess        PT Long Term Goals - 04/10/18 0941      PT LONG TERM GOAL #1   Title  Pt will be independent with advanced HEP    Baseline  Pt will be able to tolerate standing and advanced HEP    Time  6    Period  Weeks    Status  New    Target Date  05/22/18      PT LONG TERM GOAL #2   Title  Pt will be able to flex right knee in prone to 130 degrees     Baseline  Pt was 110 today on right . Left is 130    Time  6    Period  Weeks    Status  New  PT LONG TERM GOAL #3   Title  improve LEFS score =/> 9 point improvement,     Baseline  LEF on 04-10-18 38 today    Time  6    Period  Weeks    Status  New    Target Date  05/22/18      PT LONG TERM GOAL #4   Title  Pt will be able to tape her own knee to correct lateral tracking to be able to stand and exercise as needed    Time  6    Period  Weeks    Status  New            Plan - 04/24/18 1124    Clinical Impression Statement  Pt at 9/10 today with lateral right hamstring pain  with marked pain at lateral crease of knee and TTP lateral belly of lateral hamstring.  Pt is not able to stand  for greater than 2-3 hours  before becoming 9/10 pain.  Pt was not able to perform resistivive exercises with hamstring during PT today.  Pt had anteror knee pain which has resolved with exericise.  Pt will re examined by Dr xu/ Theresia Majors PA this thursday 10/31 at 9:15 to make sure she is able to continue loading / exercising knee without harm.  Pt is concerned it is a tear/ Bakers cyst. MD to rule out dx in order to complete PT scheduled throughout November.       Rehab Potential  Good    PT Frequency  2x / week    PT Duration  6 weeks    PT Treatment/Interventions  Iontophoresis 4mg /ml Dexamethasone;Gait training;Stair training;Neuromuscular re-education;Dry needling;Manual techniques;Moist Heat;Therapeutic activities;Patient/family education;Taping;Vasopneumatic Device;Therapeutic exercise;Cryotherapy;Electrical Stimulation;Balance training    PT Next Visit Plan  assess ionto benifit.  Continue standing HEP, hamstring loading.  Pt to MD 10/31 and then to PT    PT Home Exercise Plan   She has other exercises issued at previous sessions.  Monster walk no band.     Consulted and Agree with Plan of Care  Patient       Patient will benefit from skilled therapeutic intervention in order to improve the following deficits and impairments:  Pain, Decreased mobility, Decreased range of motion, Decreased strength, Impaired flexibility  Visit Diagnosis: Acute pain of right knee  Muscle weakness (generalized)  Stiffness of left ankle, not elsewhere classified  Pain in left ankle and joints of left foot  Difficulty in walking, not elsewhere classified     Problem List Patient Active Problem List   Diagnosis Date Noted  . TIA (transient ischemic attack) 10/24/2017  . Menorrhagia 12/12/2016  . Ventricular septal defect (VSD), membranous 09/02/2016  . Pulmonary hypertension, primary ([place]) 08/23/2016  . Chest tightness 08/03/2016  . Cough 08/03/2016  . Dyspnea and respiratory abnormality 04/16/2014  . Tricuspid  regurgitation   . Asthma   . VSD (ventricular septal defect), perimembranous 09/03/2013  . Pulmonary hypertension ([place]) 09/03/2013  . Essential hypertension 08/06/2013  . Diabetes mellitus ([place]) 08/06/2013  . Obesity 08/06/2013  . Hypertension 07/27/2012  . Awareness of heartbeats 07/27/2012  . 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 Perimembranous ventricular septal defect 07/27/2012   Voncille Lo, PT Certified Exercise Expert for the Aging Adult  04/24/18 2:54 PM Phone: [phone] Fax: [facility] [address] [place], [place], [address] Phone: [phone]   Fax:  [phone]  Name: Mackenzie Patterson MRN: [id number] Date of Birth:  11/24/1970   

## 2018-04-26 ENCOUNTER — Ambulatory Visit: Payer: Medicaid Other | Admitting: Physical Therapy

## 2018-04-26 ENCOUNTER — Ambulatory Visit (INDEPENDENT_AMBULATORY_CARE_PROVIDER_SITE_OTHER): Payer: Medicaid Other | Admitting: Physician Assistant

## 2018-04-26 ENCOUNTER — Telehealth (INDEPENDENT_AMBULATORY_CARE_PROVIDER_SITE_OTHER): Payer: Self-pay

## 2018-04-26 ENCOUNTER — Encounter (INDEPENDENT_AMBULATORY_CARE_PROVIDER_SITE_OTHER): Payer: Self-pay | Admitting: Physician Assistant

## 2018-04-26 ENCOUNTER — Encounter: Payer: Self-pay | Admitting: Physical Therapy

## 2018-04-26 DIAGNOSIS — M25672 Stiffness of left ankle, not elsewhere classified: Secondary | ICD-10-CM | POA: Diagnosis not present

## 2018-04-26 DIAGNOSIS — M25561 Pain in right knee: Secondary | ICD-10-CM | POA: Diagnosis not present

## 2018-04-26 DIAGNOSIS — M6281 Muscle weakness (generalized): Secondary | ICD-10-CM | POA: Diagnosis not present

## 2018-04-26 DIAGNOSIS — M2241 Chondromalacia patellae, right knee: Secondary | ICD-10-CM | POA: Insufficient documentation

## 2018-04-26 DIAGNOSIS — R262 Difficulty in walking, not elsewhere classified: Secondary | ICD-10-CM | POA: Diagnosis not present

## 2018-04-26 DIAGNOSIS — M25572 Pain in left ankle and joints of left foot: Secondary | ICD-10-CM

## 2018-04-26 NOTE — Progress Notes (Signed)
Office Visit Note   Patient: Mackenzie Patterson           Date of Birth: 1971-01-21           MRN: [id number] Visit Date: 04/26/2018              Requested by: Clent Demark, PA-C [place], [place] [address] PCP: Clent Demark, PA-C   Assessment & Plan: Visit Diagnoses:  1. Chondromalacia patellae, right knee     Plan: Impression is right knee chondral malacia patella.  At this point, the patient needs to continue working on strengthening exercises for the quadriceps.  I have told her that the cortisone injection that she had a week ago could take another week before it starts to help.  In the meantime, we will send for approval for Visco supplementation injections.  After also told her to stop using her mother scooter as I do not want her quad to weaken any further.  Follow-up with [name] in 6 weeks time for recheck.  Follow-Up Instructions: Return in about 6 weeks (around 06/07/2018).   Orders:  No orders of the defined types were placed in this encounter.  No orders of the defined types were placed in this encounter.     Procedures: No procedures performed   Clinical Data: No additional findings.   Subjective: Chief Complaint  Patient presents with  . Right Knee - Pain, Follow-up    HPI patient is a pleasant 47 year old female who presents to our clinic today with continued right knee pain.  History of chondromalacia patella right knee.  No known injury.  She was seen in our office approximately 6 weeks ago where she was given a hinged knee brace and sent to physical therapy for quadriceps strengthening.  She has been in physical therapy but notes no improvement of symptoms or strength.  She has not been wearing her knee brace as she does not think this helps.  She in fact has been using her mother scooter to help get around.  All of her pain is to the retropatellar area as well as the popliteal fossa.  No medial or lateral pain.  Pain is worse sitting or  standing too long with time as well as going up and down stairs.  She does note that she was given a cortisone injection to the right knee at a [facility] facility about 1 week ago.  She has not noticed any improvement of symptoms since then.  No previous viscosupplementation injection.  Review of Systems as detailed in HPI.  All others reviewed and are negative.   Objective: Vital Signs: LMP 04/08/2018 (Approximate)   Physical Exam well-developed well-nourished female no acute distress.  Alert and oriented x3.  Ortho Exam examination of the right knee shows no effusion.  Range of motion 0 to 120 degrees.  Very minimal patella crepitus.  No joint line tenderness.  4 out of 5 strength with resisted straight leg raise.  Specialty Comments:  No specialty comments available.  Imaging: No new imaging   PMFS History: Patient Active Problem List   Diagnosis Date Noted  . 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 TIA (transient ischemic attack) 10/24/2017  . Menorrhagia 12/12/2016  . Ventricular septal defect (VSD), membranous 09/02/2016  . Pulmonary hypertension, primary ([place]) 08/23/2016  . Chest tightness 08/03/2016  . Cough 08/03/2016  . Dyspnea and respiratory abnormality 04/16/2014  . Tricuspid regurgitation   . Asthma   . VSD (ventricular septal  defect), perimembranous 09/03/2013  . Pulmonary hypertension ([place]) 09/03/2013  . Essential hypertension 08/06/2013  . Diabetes mellitus ([place]) 08/06/2013  . Obesity 08/06/2013  . Hypertension 07/27/2012  . Awareness of heartbeats 07/27/2012  . 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 Perimembranous ventricular septal defect 07/27/2012   Past Medical History:  Diagnosis Date  . Asthma   . Breast discharge 01/10/2017  . Congenital ventricular septal defect   . Diabetes mellitus without complication ([place])   . Heart murmur   . Hypertension   . Obesity   . Pulmonary hypertension (HCC)    mild   . Tricuspid  regurgitation   . VSD (ventricular septal defect), perimembranous    Qp:Qs 1.7:1 by catheterization     Family History  Problem Relation Age of Onset  . Cancer Other   . Hyperlipidemia Other   . Hypertension Other   . Asthma Other   . Diabetes Mother   . Diabetes Father     Past Surgical History:  Procedure Laterality Date  . CARDIAC CATHETERIZATION [name] 10/30/2014   Procedure: Right Heart Cath;  Surgeon: [name], MD;  Location[facility] INVASIVE CV LAB CUPID;  Service: Cardiovascular;  Laterality: [name];  . CHOLECYSTECTOMY  2018  . CYST EXCISION    . LEFT AND RIGHT HEART CATHETERIZATION WITH CORONARY ANGIOGRAM [name] 09/11/2013   Procedure: LEFT AND RIGHT HEART CATHETERIZATION WITH CORONARY ANGIOGRAM;  Surgeon: [name], MD;  Location: [facility] CATH LAB;  Service: Cardiovascular;  Laterality: [name];  . RIGHT HEART CATH [name] 09/02/2016   Procedure: Right Heart Cath;  Surgeon: [name], MD;  Location: [place] CV LAB;  Service: Cardiovascular;  Laterality: [name];  . TUBAL LIGATION     Social History   Occupational History  . Not on file  Tobacco Use  . Smoking status: Never Smoker  . Smokeless tobacco: Never Used  Substance and Sexual Activity  . Alcohol use: No  . Drug use: No  . Sexual activity: Yes    Birth control/protection: Surgical

## 2018-04-26 NOTE — Therapy (Signed)
[place], [place], [address] Phone: [phone]   Fax:  [phone]  Physical Therapy Treatment  Patient Details  Name: Mackenzie Patterson MRN: [id number] Date of Birth: [birth year] Referring Provider (PT): [name]   Encounter Date: 04/26/2018  PT End of Session - 04/26/18 1026    Visit Number  7    Number of Visits  12    Date for PT Re-Evaluation  05/22/18    Authorization Type  MCD 2nd-  04/16/2018 through  05/27/2018  12 visits    Authorization Time Period  04/16/2018 through 05/27/2018  12 visits(  used 4 visits,  asked for 8 more need to clarify if 8 more were issued or if 12 more were issued)     Authorization - Visit Number  7    Authorization - Number of Visits  12    PT Start Time  1015    PT Stop Time  1055    PT Time Calculation (min)  40 min       Past Medical History:  Diagnosis Date  . Asthma   . Breast discharge 01/10/2017  . Congenital ventricular septal defect   . Diabetes mellitus without complication ([place])   . Heart murmur   . Hypertension   . Obesity   . Pulmonary hypertension (HCC)    mild   . Tricuspid regurgitation   . VSD (ventricular septal defect), perimembranous    Qp:Qs 1.7:1 by catheterization     Past Surgical History:  Procedure Laterality Date  . CARDIAC CATHETERIZATION [name] 10/30/2014   Procedure: Right Heart Cath;  Surgeon: [name], MD;  Location: [facility] INVASIVE CV LAB CUPID;  Service: Cardiovascular;  Laterality: [name];  . CHOLECYSTECTOMY  2018  . CYST EXCISION    . LEFT AND RIGHT HEART CATHETERIZATION WITH CORONARY ANGIOGRAM [name] 09/11/2013   Procedure: LEFT AND RIGHT HEART CATHETERIZATION WITH CORONARY ANGIOGRAM;  Surgeon: [name], MD;  Location: [facility] CATH LAB;  Service: Cardiovascular;  Laterality: [name];  . RIGHT HEART CATH [name] 09/02/2016   Procedure: Right Heart Cath;  Surgeon: [name], MD;  Location: [place] CV LAB;  Service:  Cardiovascular;  Laterality: [name];  . TUBAL LIGATION      There were no vitals filed for this visit.  Subjective Assessment - 04/26/18 1021    Subjective  I had my injection last Thursday 10/24 and My PA said it would take a while before it worked. At least 2 weeks to see if injection from [facility] works. She sent a request for visco supplemention.          [facility] PT Assessment - 04/26/18 0001      Assessment   Referring Provider (PT)  [name]      AROM   Right Knee Extension  0    Right Knee Flexion  131    Left Knee Extension  0    Left Knee Flexion  145      Strength   Right Hip Flexion  4+/5   pain around knee jt   Right Hip Extension  --   5-/5   Right Hip ABduction  4/5    Right Knee Flexion  4/5   pain at HS insertion area not tolerating resistance   Right Knee Extension  5/5                   [facility] Adult PT Treatment/Exercise - 04/26/18 1036  Self-Care   Self-Care  Other Self-Care Comments    Other Self-Care Comments   discussed aquatics for more comfortable knee exerrcise      Knee/Hip Exercises: Standing   Wall Squat  15 reps   wit ball squeeze 1/2 motion to reduce pain   SLS  working on ther ex pad with perturbation. and clock 12, 3 and 6    Other Standing Knee Exercises  4 way SLR right and left 3 x 15 each     Other Standing Knee Exercises  Monster walking without band   20 feet x 3     Knee/Hip Exercises: Seated   Other Seated Knee/Hip Exercises  knee ext 2 x 15, flex 2 x 15 right  with red t band resistance    Sit to Sand  10 reps      Knee/Hip Exercises: Supine   Quad Sets  Right;2 sets;10 reps    Bridges  2 sets;10 reps      Knee/Hip Exercises: Prone   Hamstring Curl  5 reps;3 sets   3 lb              PT Education - 04/26/18 1034    Education Details  added hip to HEP with weights in standing, discusssed  aquatics exercise    Person(s) Educated  Patient    Methods  Explanation;Demonstration;Tactile  cues;Handout    Comprehension  Verbalized understanding;Returned demonstration       PT Short Term Goals - 04/16/18 1805      PT SHORT TERM GOAL #1   Title  Patient will be independent with basic HEP for lower body .     Time  4    Period  Weeks    Status  Achieved      PT SHORT TERM GOAL #2   Title  improve LEFS score =/> 9 point improvement,     Time  4    Period  Months    Status  Unable to assess      PT SHORT TERM GOAL #3   Title  increase Rt prone quad flexibility =/> 115 degrees    Period  Weeks    Status  Unable to assess      PT SHORT TERM GOAL #4   Title  improve Rt knee and hip strength to allow her to progress to SLS exercises    Time  4    Period  Weeks    Status  Unable to assess        PT Long Term Goals - 04/10/18 0941      PT LONG TERM GOAL #1   Title  Pt will be independent with advanced HEP    Baseline  Pt will be able to tolerate standing and advanced HEP    Time  6    Period  Weeks    Status  New    Target Date  05/22/18      PT LONG TERM GOAL #2   Title  Pt will be able to flex right knee in prone to 130 degrees     Baseline  Pt was 110 today on right . Left is 130    Time  6    Period  Weeks    Status  New      PT LONG TERM GOAL #3   Title  improve LEFS score =/> 9 point improvement,     Baseline  LEF on 04-10-18 38 today    Time  6    Period  Weeks    Status  New    Target Date  05/22/18      PT LONG TERM GOAL #4   Title  Pt will be able to tape her own knee to correct lateral tracking to be able to stand and exercise as needed    Time  6    Period  Weeks    Status  New            Plan - 04/26/18 1042    Clinical Impression Statement  Pt is 8/10 today but does not visibly represent that high of pain number.  Pt states that PA told her to avoid stairs and  bending of knee. but pt is able to perform some bending with exericise in PT with modification and no pain increase while in PT.  but maintain 8/10 pain post lateral  knee pain     Rehab Potential  Good    PT Frequency  2x / week    PT Duration  6 weeks    PT Treatment/Interventions  Iontophoresis 4mg /ml Dexamethasone;Gait training;Stair training;Neuromuscular re-education;Dry needling;Manual techniques;Moist Heat;Therapeutic activities;Patient/family education;Taping;Vasopneumatic Device;Therapeutic exercise;Cryotherapy;Electrical Stimulation;Balance training    PT Next Visit Plan    Continue standing HEP, hamstring loading.  MD avoid stairs.  encourage aquatics hip 4 way SLR review   Assess goals    PT Home Exercise Plan   She has other exercises issued at previous sessions.  Monster walk no band.     Consulted and Agree with Plan of Care  Patient       Patient will benefit from skilled therapeutic intervention in order to improve the following deficits and impairments:  Pain, Decreased mobility, Decreased range of motion, Decreased strength, Impaired flexibility  Visit Diagnosis: Acute pain of right knee  Muscle weakness (generalized)  Stiffness of left ankle, not elsewhere classified  Pain in left ankle and joints of left foot  Difficulty in walking, not elsewhere classified     Problem List Patient Active Problem List   Diagnosis Date Noted  . 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 TIA (transient ischemic attack) 10/24/2017  . Menorrhagia 12/12/2016  . Ventricular septal defect (VSD), membranous 09/02/2016  . Pulmonary hypertension, primary ([place]) 08/23/2016  . Chest tightness 08/03/2016  . Cough 08/03/2016  . Dyspnea and respiratory abnormality 04/16/2014  . Tricuspid regurgitation   . Asthma   . VSD (ventricular septal defect), perimembranous 09/03/2013  . Pulmonary hypertension ([place]) 09/03/2013  . Essential hypertension 08/06/2013  . Diabetes mellitus ([place]) 08/06/2013  . Obesity 08/06/2013  . Hypertension 07/27/2012  . Awareness of heartbeats 07/27/2012  . 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  Perimembranous ventricular septal defect 07/27/2012   Voncille Lo, PT Certified Exercise Expert for the Aging Adult  04/26/18 11:05 AM Phone: [phone] Fax: [facility] [address] [place], [place], [address] Phone: [phone]   Fax:  [phone]  Name: Mackenzie Patterson MRN: [id number] Date of Birth: 11/23/1970

## 2018-04-26 NOTE — Telephone Encounter (Signed)
Patient completed [employer] Patient Assistance application while in the office.   Faxed [employer] application to [phone] for Monovisc, right knee.

## 2018-04-26 NOTE — Patient Instructions (Signed)
When your knee is painful you can still do hip exercises with knee straight.    Knee High   Holding stable object, raise knee to hip level, then lower knee. OK [place]  REMEMBER TO KEEP KNEE STRAIGHT LIKE IN CLINIC. Repeat with other knee. Complete __15 X 3_ repetitions. Do __1__ sessions per day.  ABDUCTION: Standing (Active)   Stand, feet flat. Lift right leg out to side. Use _3_ lbs. Complete __15_ X 3 repetitions. Perform __1_ sessions per day.  ADDUCTION: Standing - Stable (Active)   Stand, right leg out to side as far as possible. Draw leg in across midline. Use _3__ lbs. Complete 15 X 3_ repetitions. Perform _1__ sessions per day.       EXTENSION: Standing (Active)  Stand, both feet flat. Draw right leg behind body as far as possible. Use 3___ lbs. Complete 15 X 3repetitions. Perform __1_ sessions per day.  Copyright  VHI. All rights reserved.   Voncille Lo, PT Certified Exercise Expert for the Aging Adult  04/26/18 10:33 AM Phone: [phone] Fax: [phone]

## 2018-04-30 ENCOUNTER — Ambulatory Visit: Payer: Medicaid Other | Admitting: Physical Therapy

## 2018-05-03 ENCOUNTER — Ambulatory Visit: Payer: Medicaid Other | Admitting: Physical Therapy

## 2018-05-07 ENCOUNTER — Ambulatory Visit: Payer: Medicaid Other | Attending: Orthopaedic Surgery | Admitting: Physical Therapy

## 2018-05-07 ENCOUNTER — Encounter: Payer: Self-pay | Admitting: Physical Therapy

## 2018-05-07 DIAGNOSIS — M25672 Stiffness of left ankle, not elsewhere classified: Secondary | ICD-10-CM | POA: Insufficient documentation

## 2018-05-07 DIAGNOSIS — M25561 Pain in right knee: Secondary | ICD-10-CM | POA: Diagnosis not present

## 2018-05-07 DIAGNOSIS — M25572 Pain in left ankle and joints of left foot: Secondary | ICD-10-CM | POA: Diagnosis not present

## 2018-05-07 DIAGNOSIS — M6281 Muscle weakness (generalized): Secondary | ICD-10-CM | POA: Insufficient documentation

## 2018-05-07 DIAGNOSIS — R262 Difficulty in walking, not elsewhere classified: Secondary | ICD-10-CM

## 2018-05-07 NOTE — Therapy (Signed)
[place], [place], [address] Phone: [phone]   Fax:  [phone]  Physical Therapy Treatment  Patient Details  Name: Mackenzie Patterson MRN: [id number] Date of Birth: Apr 04, 1971 Referring Provider (PT): [name]   Encounter Date: 05/07/2018  PT End of Session - 05/07/18 0932    Visit Number  8    Number of Visits  12    Date for PT Re-Evaluation  05/22/18    Authorization Type  MCD 2nd-  04/16/2018 through  05/27/2018  12 visits    PT Start Time  0932    PT Stop Time  1017    PT Time Calculation (min)  45 min    Activity Tolerance  Patient tolerated treatment well       Past Medical History:  Diagnosis Date  . Asthma   . Breast discharge 01/10/2017  . Congenital ventricular septal defect   . Diabetes mellitus without complication ([place])   . Heart murmur   . Hypertension   . Obesity   . Pulmonary hypertension (HCC)    mild   . Tricuspid regurgitation   . VSD (ventricular septal defect), perimembranous    Qp:Qs 1.7:1 by catheterization     Past Surgical History:  Procedure Laterality Date  . CARDIAC CATHETERIZATION [name] 10/30/2014   Procedure: Right Heart Cath;  Surgeon: [name], MD;  Location: [facility] INVASIVE CV LAB CUPID;  Service: Cardiovascular;  Laterality: [name];  . CHOLECYSTECTOMY  2018  . CYST EXCISION    . LEFT AND RIGHT HEART CATHETERIZATION WITH CORONARY ANGIOGRAM [name] 09/11/2013   Procedure: LEFT AND RIGHT HEART CATHETERIZATION WITH CORONARY ANGIOGRAM;  Surgeon: [name], MD;  Location: [facility] CATH LAB;  Service: Cardiovascular;  Laterality: [name];  . RIGHT HEART CATH [name] 09/02/2016   Procedure: Right Heart Cath;  Surgeon: [name], MD;  Location: [place] CV LAB;  Service: Cardiovascular;  Laterality: [name];  . TUBAL LIGATION      There were no vitals filed for this visit.  Subjective Assessment - 05/07/18 0936    Subjective  Pt reports she is doing ok, no pain this  morning, only has it after sitting or standing for long periods of time.  Her HEP is still challenging for her. She reports not difference with ionto -  patch also doesnt stick well     Patient Stated Goals  get back walking like she was, was walking about and hour a day - has a cruise next year and wants to use her leg.     Currently in Pain?  No/denies   when she does have pain it is in the posterior knee        [facility] PT Assessment - 05/07/18 0001      Assessment   Medical Diagnosis  Rt knee chondromalacia    Referring Provider (PT)  [name]      AROM   Right Knee Extension  0    Right Knee Flexion  135      Flexibility   Quadriceps  prone with strap 130 dgrees Rt                    [facility] Adult PT Treatment/Exercise - 05/07/18 0001      Exercises   Exercises  Knee/Hip      Knee/Hip Exercises: Stretches   Active Hamstring Stretch  Right   30 knee presses with leg up in stertch   Passive  Hamstring Stretch  Right;20 seconds   seated FWD leans   Quad Stretch  Right;2 reps;30 seconds   prone with strap   Hip Flexor Stretch  Right;2 reps;30 seconds   on EOB   Other Knee/Hip Stretches  BKTC, LTR each side, butterfly stretch in hooklying      Knee/Hip Exercises: Aerobic   Stationary Bike  L3x5'      Knee/Hip Exercises: Standing   SLS  Rt with FWD leans, 2x10 VC for form      Knee/Hip Exercises: Seated   Other Seated Knee/Hip Exercises  3x10,long sit, ER SLR with hip abduction    Sit to Sand  without UE support;10 reps   each up bilat/down Rt, the single leg Rt up/down     Knee/Hip Exercises: Supine   Quad Sets  Strengthening;Both;10 reps   each  exercise, regular, single,    Bridges  10 reps   each    Bridges Limitations  regular, figure 4, full up-half down-ful up-all down, heels together toes out      Knee/Hip Exercises: Sidelying   Hip ADduction  Strengthening;Right;10 reps   each with Lt LE on chair   Hip ADduction Limitations  regular, with  FWD/BWD kicks, CW/CCW cirlce      Modalities   Modalities  Ultrasound      Ultrasound   Ultrasound Location  posterior Rt knee    Ultrasound Parameters  50% 1.71mZ, 1.0w/cm2    Ultrasound Goals  Pain               PT Short Term Goals - 05/07/18 07035     PT SHORT TERM GOAL #1   Title  Patient will be independent with basic HEP for lower body .     Status  Achieved      PT SHORT TERM GOAL #2   Title  improve LEFS score =/> 9 point improvement,     Status  On-going      PT SHORT TERM GOAL #3   Title  increase Rt prone quad flexibility =/> 115 degrees    Status  Achieved   130 prone wih strap     PT SHORT TERM GOAL #4   Title  improve Rt knee and hip strength to allow her to progress to SLS exercises    Status  On-going        PT Long Term Goals - 05/07/18 00093     PT LONG TERM GOAL #1   Title  Pt will be independent with advanced HEP    Status  On-going      PT LONG TERM GOAL #2   Title  Pt will be able to flex right knee in prone to 130 degrees     Status  Achieved   130 prone with strap     PT LONG TERM GOAL #3   Title  improve LEFS score =/> 9 point improvement,     Status  On-going      PT LONG TERM GOAL #4   Title  Pt will be able to tape her own knee to correct lateral tracking to be able to stand and exercise as needed    Status  On-going            Plan - 05/07/18 1Black Forestreports some feelings of increased motion in the back of her Rt knee after [place]  She was able to perform higher level strengthening with  good form. She has met her quad flexibility stretch goal and making . Would benefit from continued tx to improve Rt LE functional strength and proprioception    Rehab Potential  Good    PT Frequency  2x / week    PT Duration  6 weeks    PT Treatment/Interventions  Iontophoresis 32m/ml Dexamethasone;Gait training;Stair training;Neuromuscular re-education;Dry needling;Manual techniques;Moist  Heat;Therapeutic activities;Patient/family education;Taping;Vasopneumatic Device;Therapeutic exercise;Cryotherapy;Electrical Stimulation;Balance training    PT Next Visit Plan  higher level strenthenign LE and core, proprioception work assess respone to ther [place]   Consulted and Agree with Plan of Care  Patient       Patient will benefit from skilled therapeutic intervention in order to improve the following deficits and impairments:  Pain, Decreased mobility, Decreased range of motion, Decreased strength, Impaired flexibility  Visit Diagnosis: Acute pain of right knee  Muscle weakness (generalized)  Stiffness of left ankle, not elsewhere classified  Pain in left ankle and joints of left foot  Difficulty in walking, not elsewhere classified     Problem List Patient Active Problem List   Diagnosis Date Noted  . 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 TIA (transient ischemic attack) 10/24/2017  . Menorrhagia 12/12/2016  . Ventricular septal defect (VSD), membranous 09/02/2016  . Pulmonary hypertension, primary ([place] 08/23/2016  . Chest tightness 08/03/2016  . Cough 08/03/2016  . Dyspnea and respiratory abnormality 04/16/2014  . Tricuspid regurgitation   . Asthma   . VSD (ventricular septal defect), perimembranous 09/03/2013  . Pulmonary hypertension ([place] 09/03/2013  . Essential hypertension 08/06/2013  . Diabetes mellitus ([place] 08/06/2013  . Obesity 08/06/2013  . Hypertension 07/27/2012  . Awareness of heartbeats 07/27/2012  . 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 Perimembranous ventricular septal defect 07/27/2012   SJeral PinchPT  05/07/2018, 10:18 AM  [facility] [address]Phone: [phone]  Fax:  [phone] Name: Mackenzie SchuffMRN: [id number] of Birth: [birth year]

## 2018-05-10 ENCOUNTER — Encounter: Payer: Self-pay | Admitting: Physical Therapy

## 2018-05-10 ENCOUNTER — Other Ambulatory Visit (HOSPITAL_COMMUNITY)
Admission: RE | Admit: 2018-05-10 | Discharge: 2018-05-10 | Disposition: A | Discharge status: Home / Self Care | Payer: Medicaid Other | Source: Ambulatory Visit | Attending: Obstetrics and Gynecology | Admitting: Obstetrics and Gynecology

## 2018-05-10 ENCOUNTER — Ambulatory Visit: Payer: Medicaid Other | Admitting: Physical Therapy

## 2018-05-10 ENCOUNTER — Ambulatory Visit: Payer: Medicaid Other | Admitting: Obstetrics and Gynecology

## 2018-05-10 ENCOUNTER — Ambulatory Visit (INDEPENDENT_AMBULATORY_CARE_PROVIDER_SITE_OTHER): Payer: Medicaid Other | Admitting: Physician Assistant

## 2018-05-10 ENCOUNTER — Encounter: Payer: Self-pay | Admitting: Obstetrics and Gynecology

## 2018-05-10 ENCOUNTER — Encounter (INDEPENDENT_AMBULATORY_CARE_PROVIDER_SITE_OTHER): Payer: Self-pay | Admitting: Physician Assistant

## 2018-05-10 VITALS — BP 135/63 | HR 87 | Ht 59.0 in | Wt 167.8 lb

## 2018-05-10 VITALS — BP 156/75 | HR 101 | Resp 20 | Ht 59.0 in | Wt 166.4 lb

## 2018-05-10 DIAGNOSIS — N852 Hypertrophy of uterus: Secondary | ICD-10-CM

## 2018-05-10 DIAGNOSIS — M25561 Pain in right knee: Secondary | ICD-10-CM | POA: Diagnosis not present

## 2018-05-10 DIAGNOSIS — Z0000 Encounter for general adult medical examination without abnormal findings: Secondary | ICD-10-CM

## 2018-05-10 DIAGNOSIS — J4540 Moderate persistent asthma, uncomplicated: Secondary | ICD-10-CM

## 2018-05-10 DIAGNOSIS — M25572 Pain in left ankle and joints of left foot: Secondary | ICD-10-CM

## 2018-05-10 DIAGNOSIS — M25672 Stiffness of left ankle, not elsewhere classified: Secondary | ICD-10-CM | POA: Diagnosis not present

## 2018-05-10 DIAGNOSIS — D649 Anemia, unspecified: Secondary | ICD-10-CM

## 2018-05-10 DIAGNOSIS — R262 Difficulty in walking, not elsewhere classified: Secondary | ICD-10-CM | POA: Diagnosis not present

## 2018-05-10 DIAGNOSIS — N898 Other specified noninflammatory disorders of vagina: Secondary | ICD-10-CM

## 2018-05-10 DIAGNOSIS — D219 Benign neoplasm of connective and other soft tissue, unspecified: Secondary | ICD-10-CM | POA: Insufficient documentation

## 2018-05-10 DIAGNOSIS — Z01419 Encounter for gynecological examination (general) (routine) without abnormal findings: Secondary | ICD-10-CM | POA: Insufficient documentation

## 2018-05-10 DIAGNOSIS — K089 Disorder of teeth and supporting structures, unspecified: Secondary | ICD-10-CM | POA: Diagnosis not present

## 2018-05-10 DIAGNOSIS — M6281 Muscle weakness (generalized): Secondary | ICD-10-CM

## 2018-05-10 DIAGNOSIS — I10 Essential (primary) hypertension: Secondary | ICD-10-CM | POA: Diagnosis not present

## 2018-05-10 MED ORDER — ALBUTEROL SULFATE HFA 108 (90 BASE) MCG/ACT IN AERS: 2.0000 | INHALATION_SPRAY | Freq: Four times a day (QID) | RESPIRATORY_TRACT | 5 refills | Status: DC | PRN

## 2018-05-10 MED ORDER — BUDESONIDE-FORMOTEROL FUMARATE 160-4.5 MCG/ACT IN AERO: 2.0000 | INHALATION_SPRAY | Freq: Two times a day (BID) | RESPIRATORY_TRACT | 5 refills | Status: DC

## 2018-05-10 NOTE — Therapy (Signed)
[place], [place], [address] Phone: [phone]   Fax:  [phone]  Physical Therapy Treatment  Patient Details  Name: Mackenzie Patterson MRN: [id number] Date of Birth: [birth year] Referring Provider (PT): [name]   Encounter Date: 05/10/2018  PT End of Session - 05/10/18 0933    Visit Number  9    Number of Visits  12    Date for PT Re-Evaluation  05/22/18    Authorization Type  MCD 2nd-  04/16/2018 through  05/27/2018  12 visits    Authorization Time Period  04/16/2018 through 05/27/2018  12 visits(  used 4 visits,  asked for 8 more need to clarify if 8 more were issued or if 12 more were issued)     Authorization - Visit Number  9    Authorization - Number of Visits  12    PT Start Time  0932    Activity Tolerance  Patient tolerated treatment well    Behavior During Therapy  [facility] for tasks assessed/performed       Past Medical History:  Diagnosis Date  . Asthma   . Breast discharge 01/10/2017  . Congenital ventricular septal defect   . Diabetes mellitus without complication ([place])   . Heart murmur   . Hypertension   . Obesity   . Pulmonary hypertension (HCC)    mild   . Tricuspid regurgitation   . VSD (ventricular septal defect), perimembranous    Qp:Qs 1.7:1 by catheterization     Past Surgical History:  Procedure Laterality Date  . CARDIAC CATHETERIZATION [name] 10/30/2014   Procedure: Right Heart Cath;  Surgeon: [name], MD;  Location: [facility] INVASIVE CV LAB CUPID;  Service: Cardiovascular;  Laterality: [name];  . CHOLECYSTECTOMY  2018  . CYST EXCISION    . LEFT AND RIGHT HEART CATHETERIZATION WITH CORONARY ANGIOGRAM [name] 09/11/2013   Procedure: LEFT AND RIGHT HEART CATHETERIZATION WITH CORONARY ANGIOGRAM;  Surgeon: [name], MD;  Location: [facility] CATH LAB;  Service: Cardiovascular;  Laterality: [name];  . RIGHT HEART CATH [name] 09/02/2016   Procedure: Right Heart Cath;  Surgeon: [name],  MD;  Location: [place] CV LAB;  Service: Cardiovascular;  Laterality: [name];  . TUBAL LIGATION      There were no vitals filed for this visit.  Subjective Assessment - 05/10/18 0934    Subjective  Pt had to take a tylenol. All of the suddent my knee.  Pt wearing high fashion boots and not ready to do PT     Patient Stated Goals  get back walking like she was, was walking about and hour a day - has a cruise next year and wants to use her leg.     Currently in Pain?  Yes    Pain Score  8     Pain Location  Knee    Pain Orientation  Right;Posterior;Anterior    Pain Descriptors / Indicators  Sharp;Throbbing   sharp in ant. throbbing in back   Pain Type  Chronic pain    Pain Onset  More than a month ago    Pain Frequency  Intermittent                       [facility] Adult PT Treatment/Exercise - 05/10/18 0957      Exercises   Exercises  Knee/Hip      Knee/Hip Exercises: Stretches   Active Hamstring Stretch  Right   15  knee presses with leg up straight   Quad Stretch  Right;2 reps;30 seconds   prone with strap   Hip Flexor Stretch  Right;2 reps;30 seconds   on EOB     Knee/Hip Exercises: Standing   Lateral Step Up  Hand Hold: 2;15 reps    Lateral Step Up Limitations  x2    Forward Step Up  Hand Hold: 2;15 reps   x 2   SLS  Rt with FWD leans, 2x10 VC for form    Walking with Sports Cord  mini squat with green t band on medial knee x 15 1/2 squat    Other Standing Knee Exercises  4 way SLR right and left 3 x 15 each     5# weight on right     Knee/Hip Exercises: Supine   Bridges  10 reps   each    Bridges Limitations  regular, figure 4, full up-half down-ful up-all down, heels together toes out    Knee Extension Limitations  single limb sit to stand 15 x 2 and 1 x 15 on ther ex pad for added height initially    Other Supine Knee/Hip Exercises  3 way gastroc stretch on step x 15,  medicine ball toss on level SLS Right x 15 and then on foam 15 x with visible coc  ontraction    Other Supine Knee/Hip Exercises  single limb stance on right with  clocks  for 5 minutes      Ultrasound   Ultrasound Location  Posterior R knee    Ultrasound Parameters  50 MHZ  , 1.0 w/xm2    Ultrasound Goals  Pain             PT Education - 05/10/18 0949    Education Details  added proprioception knee exercise HEP and reviewed HEP    Person(s) Educated  Patient    Methods  Explanation;Demonstration;Tactile cues;Verbal cues;Handout    Comprehension  Verbalized understanding;Returned demonstration       PT Short Term Goals - 05/07/18 0936      PT SHORT TERM GOAL #1   Title  Patient will be independent with basic HEP for lower body .     Status  Achieved      PT SHORT TERM GOAL #2   Title  improve LEFS score =/> 9 point improvement,     Status  On-going      PT SHORT TERM GOAL #3   Title  increase Rt prone quad flexibility =/> 115 degrees    Status  Achieved   130 prone wih strap     PT SHORT TERM GOAL #4   Title  improve Rt knee and hip strength to allow her to progress to SLS exercises    Status  On-going        PT Long Term Goals - 05/07/18 0630      PT LONG TERM GOAL #1   Title  Pt will be independent with advanced HEP    Status  On-going      PT LONG TERM GOAL #2   Title  Pt will be able to flex right knee in prone to 130 degrees     Status  Achieved   130 prone with strap     PT LONG TERM GOAL #3   Title  improve LEFS score =/> 9 point improvement,     Status  On-going      PT LONG TERM GOAL #4   Title  Pt  will be able to tape her own knee to correct lateral tracking to be able to stand and exercise as needed    Status  On-going            Plan - 05/10/18 1134    Clinical Impression Statement  Pt reports increased pain in ant and posterior knee ( due to weather as per pt report) but reduced to 5/10 after exercise activity.  Pt given proprioception HEP and will continue with strenghtening.  Pt would benefit from pain  education in remaining visits    Rehab Potential  Good    PT Frequency  2x / week    PT Duration  6 weeks    PT Treatment/Interventions  Iontophoresis 4mg /ml Dexamethasone;Gait training;Stair training;Neuromuscular re-education;Dry needling;Manual techniques;Moist Heat;Therapeutic activities;Patient/family education;Taping;Vasopneumatic Device;Therapeutic exercise;Cryotherapy;Electrical Stimulation;Balance training    PT Next Visit Plan  higher level strenthenign LE and core, proprioception work assess respone to ther [name]    Consulted and Agree with Plan of Care  Patient       Patient will benefit from skilled therapeutic intervention in order to improve the following deficits and impairments:  Pain, Decreased mobility, Decreased range of motion, Decreased strength, Impaired flexibility  Visit Diagnosis: Acute pain of right knee  Muscle weakness (generalized)  Stiffness of left ankle, not elsewhere classified  Pain in left ankle and joints of left foot  Difficulty in walking, not elsewhere classified     Problem List Patient Active Problem List   Diagnosis Date Noted  . 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 TIA (transient ischemic attack) 10/24/2017  . Menorrhagia 12/12/2016  . Ventricular septal defect (VSD), membranous 09/02/2016  . Pulmonary hypertension, primary ([place]) 08/23/2016  . Chest tightness 08/03/2016  . Cough 08/03/2016  . Dyspnea and respiratory abnormality 04/16/2014  . Tricuspid regurgitation   . Asthma   . VSD (ventricular septal defect), perimembranous 09/03/2013  . Pulmonary hypertension ([place]) 09/03/2013  . Essential hypertension 08/06/2013  . Diabetes mellitus ([place]) 08/06/2013  . Obesity 08/06/2013  . Hypertension 07/27/2012  . Awareness of heartbeats 07/27/2012  . 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 Perimembranous ventricular septal defect 07/27/2012   Voncille Lo, PT Certified Exercise Expert for the Aging Adult   05/10/18 11:37 AM Phone: [phone] Fax: [place] [facility] [address] [place], [place], [address] Phone: [phone]   Fax:  [phone]  Name: Mackenzie Patterson MRN: [id number] Date of Birth: October 10, 1970

## 2018-05-10 NOTE — Patient Instructions (Addendum)
    Voncille Lo, PT Certified Exercise Expert for the Aging Adult  05/10/18 9:49 AM Phone: [phone] Fax: [phone]

## 2018-05-10 NOTE — Progress Notes (Signed)
Subjective:  [name], female    DOB: 03/24/1971  Age: 47 y.o. MRN: [id number]  CC: f/u HTN  HPI  Mackenzie Patterson a 47 y.o.femalewith a medical history of asthma, congenital VSD(managed by [facility]), DM2, heart murmur, HTN,TIA,obesity, pulmonary HTN, and tricuspid regurgitation presents to f/u on HTN. Last BP 167/97 mmHg last month. Amlodipine 5 mg refilled and lisinopril 20 mg prescribed. Pt taking anti-hypertensives as directed. BP 135/63 mmHg today in clinic.     Pt had been eating ice cubes at last visit. CBC revealed Hgb 9.4 g/dL four weeks ago. Pt notified and ferrous sulfate 325 mg TID was sent to pharmacy. Reports taking iron pills as indicated.     Requests asthma inhaler refill. States she has been using 2 puffs twice daily everyday. Says original prescriber told her to use albuterol in this manner. Reportedly never prescribed any maintainer therapies or seen pulmonologist.       Outpatient Medications Prior to Visit  Medication Sig Dispense Refill  . ACCU-CHEK FASTCLIX LANCETS MISC USE 2 TIMES DAILY AS DIRECTED 102 each 2  . amLODipine (NORVASC) 5 MG tablet Take 1 tablet (5 mg total) by mouth daily. 90 tablet 1  . aspirin EC 81 MG tablet Take 1 tablet (81 mg total) by mouth daily. 90 tablet 3  . atorvastatin (LIPITOR) 20 MG tablet Take 1 tablet (20 mg total) by mouth daily. 90 tablet 3  . Blood Glucose Monitoring Suppl (ACCU-CHEK AVIVA PLUS) w/Device KIT 1 kit by Does not apply route daily. 1 kit 0  . carvedilol (COREG) 6.25 MG tablet Take 6.25 mg by mouth 2 (two) times daily with a meal.  11  . Cholecalciferol (VITAMIN D3) 125 MCG (5000 UT) TABS Take 1 tablet by mouth daily.    . ferrous sulfate 325 (65 FE) MG EC tablet Take 1 tablet (325 mg total) by mouth 3 (three) times daily with meals. 90 tablet 1  . glucose blood (ACCU-CHEK GUIDE) test strip USE 2 TIMES DAILY AS DIRECFTED 70 each 5  . lisinopril (PRINIVIL,ZESTRIL) 20 MG tablet Take 1 tablet (20  mg total) by mouth daily. 90 tablet 3  . metFORMIN (GLUCOPHAGE XR) 500 MG 24 hr tablet Take 1 tablet (500 mg total) by mouth daily with breakfast. 90 tablet 1  . traZODone (DESYREL) 50 MG tablet Take 2 tablets (100 mg total) by mouth at bedtime. 60 tablet 3   No facility-administered medications prior to visit.      ROS Review of Systems  Constitutional: Negative for chills, fever and malaise/fatigue.  Eyes: Negative for blurred vision.  Respiratory: Negative for shortness of breath.   Cardiovascular: Negative for chest pain and palpitations.  Gastrointestinal: Negative for abdominal pain and nausea.  Genitourinary: Negative for dysuria and hematuria.  Musculoskeletal: Negative for joint pain and myalgias.  Skin: Negative for rash.  Neurological: Negative for tingling and headaches.  Psychiatric/Behavioral: Negative for depression. The patient is not nervous/anxious.     Objective:  BP 135/63 (BP Location: Left Arm, Patient Position: Sitting, Cuff Size: Normal)   Pulse 87   Ht '4\' 11"'  (1.499 m)   Wt 167 lb 12.8 oz (76.1 kg)   LMP 05/01/2018 (Exact Date)   SpO2 99%   BMI 33.89 kg/m   BP/Weight 05/10/2018 05/10/2018 37/90/2409  Systolic BP 735 329 924  Diastolic BP 63 75 97  Wt. (Lbs) 167.8 166.4 165  BMI 33.89 33.61 33.33      Physical Exam  Constitutional: She is  oriented to person, place, and time.  Well developed, well nourished, NAD, polite  HENT:  Head: Normocephalic and atraumatic.  Eyes: No scleral icterus.  Neck: Normal range of motion. Neck supple. No thyromegaly present.  Cardiovascular: Normal rate, regular rhythm and normal heart sounds.  Pulmonary/Chest: Effort normal and breath sounds normal.  Abdominal: Soft. Bowel sounds are normal. There is no tenderness.  Musculoskeletal: She exhibits no edema.  Neurological: She is alert and oriented to person, place, and time.  Skin: Skin is warm and dry. No rash noted. No erythema. No pallor.  Psychiatric: She  has a normal mood and affect. Her behavior is normal. Thought content normal.  Vitals reviewed.    Assessment & Plan:    1. Hypertension, unspecified type - Fair control with Amlodipine 5 mg, Carvedilol 6.25 mg BID, and Lisinopril 20 mg.  2. Poor dentition - Ambulatory referral to Dentistry  3. Anemia, unspecified type - CBC, Future. Pt advised to continue taking iron pills and to work with GYN for treatment and evaluation of uterine cysts and possible endometrial hyperplasia. Has another [name] ordered and scheduled for 05/17/18.   4. Moderate persistent asthma without complication - albuterol (PROVENTIL HFA;VENTOLIN HFA) 108 (90 Base) MCG/ACT inhaler; Inhale 2 puffs into the lungs every 6 (six) hours as needed for wheezing or shortness of breath.  Dispense: 1 Inhaler; Refill: 5 - budesonide-formoterol (SYMBICORT) 160-4.5 MCG/ACT inhaler; Inhale 2 puffs into the lungs 2 (two) times daily.  Dispense: 1 Inhaler; Refill: 5   Meds ordered this encounter  Medications  . albuterol (PROVENTIL HFA;VENTOLIN HFA) 108 (90 Base) MCG/ACT inhaler    Sig: Inhale 2 puffs into the lungs every 6 (six) hours as needed for wheezing or shortness of breath.    Dispense:  1 Inhaler    Refill:  5    Order Specific Question:   Supervising Provider    Answer:   Charlott Rakes [[id number]]  . budesonide-formoterol (SYMBICORT) 160-4.5 MCG/ACT inhaler    Sig: Inhale 2 puffs into the lungs 2 (two) times daily.    Dispense:  1 Inhaler    Refill:  5    Order Specific Question:   Supervising Provider    Answer:   Charlott Rakes [[id number]]    Follow-up: 8 weeks for asthma  Clent Demark PA

## 2018-05-10 NOTE — Patient Instructions (Addendum)
[employer]    Asthma, Adult Asthma is a condition of the lungs in which the airways tighten and narrow. Asthma can make it hard to breathe. Asthma cannot be cured, but medicine and lifestyle changes can help control it. Asthma may be started (triggered) by:  Animal skin flakes (dander).  Dust.  Cockroaches.  Pollen.  Mold.  Smoke.  Cleaning products.  Hair sprays or aerosol sprays.  Paint fumes or strong smells.  Cold air, weather changes, and winds.  Crying or laughing hard.  Stress.  Certain medicines or drugs.  Foods, such as dried fruit, potato chips, and sparkling grape juice.  Infections or conditions (colds, flu).  Exercise.  Certain medical conditions or diseases.  Exercise or tiring activities.  Follow these instructions at home:  Take medicine as told by your doctor.  Use a peak flow meter as told by your doctor. A peak flow meter is a tool that measures how well the lungs are working.  Record and keep track of the peak flow meter's readings.  Understand and use the asthma action plan. An asthma action plan is a written plan for taking care of your asthma and treating your attacks.  To help prevent asthma attacks: ? Do not smoke. Stay away from secondhand smoke. ? Change your heating and air conditioning filter often. ? Limit your use of fireplaces and wood stoves. ? Get rid of pests (such as roaches and mice) and their droppings. ? Throw away plants if you see mold on them. ? Clean your floors. Dust regularly. Use cleaning products that do not smell. ? Have someone vacuum when you are not home. Use a vacuum cleaner with a HEPA filter if possible. ? Replace carpet with wood, tile, or vinyl flooring. Carpet can trap animal skin flakes and dust. ? Use allergy-proof pillows, mattress covers, and box spring covers. ? Wash bed sheets and blankets every week in hot water and dry them in a  dryer. ? Use blankets that are made of polyester or cotton. ? Clean bathrooms and kitchens with bleach. If possible, have someone repaint the walls in these rooms with mold-resistant paint. Keep out of the rooms that are being cleaned and painted. ? Wash hands often. Contact a doctor if:  You have make a whistling sound when breaking (wheeze), have shortness of breath, or have a cough even if taking medicine to prevent attacks.  The colored mucus you cough up (sputum) is thicker than usual.  The colored mucus you cough up changes from clear or white to yellow, green, gray, or bloody.  You have problems from the medicine you are taking such as: ? A rash. ? Itching. ? Swelling. ? Trouble breathing.  You need reliever medicines more than 2-3 times a week.  Your peak flow measurement is still at 50-79% of your personal best after following the action plan for 1 hour.  You have a fever. Get help right away if:  You seem to be worse and are not responding to medicine during an asthma attack.  You are short of breath even at rest.  You get short of breath when doing very little activity.  You have trouble eating, drinking, or talking.  You have chest pain.  You have a fast heartbeat.  Your lips or fingernails start to turn blue.  You are light-headed, dizzy, or faint.  Your peak flow is less than 50% of your personal best. This information is not intended to replace advice given to you by your  health care provider. Make sure you discuss any questions you have with your health care provider. Document Released: 11/30/2007 Document Revised: 11/19/2015 Document Reviewed: 01/10/2013 Elsevier Interactive Patient Education  2017 [employer].      Hypertension Hypertension, commonly called high blood pressure, is when the force of blood pumping through the arteries is too strong. The arteries are the blood vessels that carry blood from the heart throughout the body. Hypertension  forces the heart to work harder to pump blood and may cause arteries to become narrow or stiff. Having untreated or uncontrolled hypertension can cause heart attacks, strokes, kidney disease, and other problems. A blood pressure reading consists of a higher number over a lower number. Ideally, your blood pressure should be below 120/80. The first ("top") number is called the systolic pressure. It is a measure of the pressure in your arteries as your heart beats. The second ("bottom") number is called the diastolic pressure. It is a measure of the pressure in your arteries as the heart relaxes. What are the causes? The cause of this condition is not known. What increases the risk? Some risk factors for high blood pressure are under your control. Others are not. Factors you can change  Smoking.  Having type 2 diabetes mellitus, high cholesterol, or both.  Not getting enough exercise or physical activity.  Being overweight.  Having too much fat, sugar, calories, or salt (sodium) in your diet.  Drinking too much alcohol. Factors that are difficult or impossible to change  Having chronic kidney disease.  Having a family history of high blood pressure.  Age. Risk increases with age.  Race. You may be at higher risk if you are African-American.  Gender. Men are at higher risk than women before age 40. After age 67, women are at higher risk than men.  Having obstructive sleep apnea.  Stress. What are the signs or symptoms? Extremely high blood pressure (hypertensive crisis) may cause:  Headache.  Anxiety.  Shortness of breath.  Nosebleed.  Nausea and vomiting.  Severe chest pain.  Jerky movements you cannot control (seizures).  How is this diagnosed? This condition is diagnosed by measuring your blood pressure while you are seated, with your arm resting on a surface. The cuff of the blood pressure monitor will be placed directly against the skin of your upper arm at the  level of your heart. It should be measured at least twice using the same arm. Certain conditions can cause a difference in blood pressure between your right and left arms. Certain factors can cause blood pressure readings to be lower or higher than normal (elevated) for a short period of time:  When your blood pressure is higher when you are in a health care provider's office than when you are at home, this is called white coat hypertension. Most people with this condition do not need medicines.  When your blood pressure is higher at home than when you are in a health care provider's office, this is called masked hypertension. Most people with this condition may need medicines to control blood pressure.  If you have a high blood pressure reading during one visit or you have normal blood pressure with other risk factors:  You may be asked to return on a different day to have your blood pressure checked again.  You may be asked to monitor your blood pressure at home for 1 week or longer.  If you are diagnosed with hypertension, you may have other blood or imaging tests  to help your health care provider understand your overall risk for other conditions. How is this treated? This condition is treated by making healthy lifestyle changes, such as eating healthy foods, exercising more, and reducing your alcohol intake. Your health care provider may prescribe medicine if lifestyle changes are not enough to get your blood pressure under control, and if:  Your systolic blood pressure is above 130.  Your diastolic blood pressure is above 80.  Your personal target blood pressure may vary depending on your medical conditions, your age, and other factors. Follow these instructions at home: Eating and drinking  Eat a diet that is high in fiber and potassium, and low in sodium, added sugar, and fat. An example eating plan is called the DASH (Dietary Approaches to Stop Hypertension) diet. To eat this  way: ? Eat plenty of fresh fruits and vegetables. Try to fill half of your plate at each meal with fruits and vegetables. ? Eat whole grains, such as whole wheat pasta, brown rice, or whole grain bread. Fill about one quarter of your plate with whole grains. ? Eat or drink low-fat dairy products, such as skim milk or low-fat yogurt. ? Avoid fatty cuts of meat, processed or cured meats, and poultry with skin. Fill about one quarter of your plate with lean proteins, such as fish, chicken without skin, beans, eggs, and tofu. ? Avoid premade and processed foods. These tend to be higher in sodium, added sugar, and fat.  Reduce your daily sodium intake. Most people with hypertension should eat less than 1,500 mg of sodium a day.  Limit alcohol intake to no more than 1 drink a day for nonpregnant women and 2 drinks a day for men. One drink equals 12 oz of beer, 5 oz of wine, or 1 oz of hard liquor. Lifestyle  Work with your health care provider to maintain a healthy body weight or to lose weight. Ask what an ideal weight is for you.  Get at least 30 minutes of exercise that causes your heart to beat faster (aerobic exercise) most days of the week. Activities may include walking, swimming, or biking.  Include exercise to strengthen your muscles (resistance exercise), such as pilates or lifting weights, as part of your weekly exercise routine. Try to do these types of exercises for 30 minutes at least 3 days a week.  Do not use any products that contain nicotine or tobacco, such as cigarettes and e-cigarettes. If you need help quitting, ask your health care provider.  Monitor your blood pressure at home as told by your health care provider.  Keep all follow-up visits as told by your health care provider. This is important. Medicines  Take over-the-counter and prescription medicines only as told by your health care provider. Follow directions carefully. Blood pressure medicines must be taken as  prescribed.  Do not skip doses of blood pressure medicine. Doing this puts you at risk for problems and can make the medicine less effective.  Ask your health care provider about side effects or reactions to medicines that you should watch for. Contact a health care provider if:  You think you are having a reaction to a medicine you are taking.  You have headaches that keep coming back (recurring).  You feel dizzy.  You have swelling in your ankles.  You have trouble with your vision. Get help right away if:  You develop a severe headache or confusion.  You have unusual weakness or numbness.  You feel faint.  You have severe pain in your chest or abdomen.  You vomit repeatedly.  You have trouble breathing. Summary  Hypertension is when the force of blood pumping through your arteries is too strong. If this condition is not controlled, it may put you at risk for serious complications.  Your personal target blood pressure may vary depending on your medical conditions, your age, and other factors. For most people, a normal blood pressure is less than 120/80.  Hypertension is treated with lifestyle changes, medicines, or a combination of both. Lifestyle changes include weight loss, eating a healthy, low-sodium diet, exercising more, and limiting alcohol. This information is not intended to replace advice given to you by your health care provider. Make sure you discuss any questions you have with your health care provider. Document Released: 06/13/2005 Document Revised: 05/11/2016 Document Reviewed: 05/11/2016 Elsevier Interactive Patient Education  [employer].

## 2018-05-10 NOTE — Progress Notes (Signed)
GYNECOLOGY CLINIC ANNUAL PREVENTATIVE CARE ENCOUNTER NOTE  Subjective:   Mackenzie Patterson is a 47 y.o. [phone] female here for a routine annual gynecologic exam.  Current complaints: none. Denies abnormal vaginal bleeding, discharge, pelvic pain, problems with intercourse or other gynecologic concerns.    Gynecologic History Patient's last menstrual period was 05/01/2018 (exact date). Contraception: tubal ligation 23 yrs ago Last Pap: 04/22/2015. Results were: normal Last mammogram: 03/21/2018. Results were: normal  Obstetric History OB History  Gravida Para Term Preterm AB Living  _0 SAB TAB Ectopic Multiple Live Births  0 1 0 1 3    # Outcome Date GA Lbr Len/2nd Weight Sex Delivery Anes PTL Lv  3 TAB 2017          2 Term         LIV  1A Preterm         LIV  1B Preterm         LIV    Past Medical History:  Diagnosis Date  . Asthma   . Breast discharge 01/10/2017  . Congenital ventricular septal defect   . Diabetes mellitus without complication ([place])   . Heart murmur   . Hypertension   . Obesity   . Pulmonary hypertension (H[facility])    mild   . Tricuspid regurgitation   . VSD (ventricular septal defect), perimembranous    Qp:Qs 1.7:1 by catheterization     Past Surgical History:  Procedure Laterality Date  . CARDIAC CATHETERIZATION [name] 10/30/2014   Procedure: Right Heart Cath;  Surgeon: [name], MD;  Location: [facility] INVASIVE CV LAB CUPID;  Service: Cardiovascular;  Laterality: [name];  . CHOLECYSTECTOMY  2018  . CYST EXCISION    . LEFT AND RIGHT HEART CATHETERIZATION WITH CORONARY ANGIOGRAM [name] 09/11/2013   Procedure: LEFT AND RIGHT HEART CATHETERIZATION WITH CORONARY ANGIOGRAM;  Surgeon: [name], MD;  Location: [facility] CATH LAB;  Service: Cardiovascular;  Laterality: [name];  . RIGHT HEART CATH [name] 09/02/2016   Procedure: Right Heart Cath;  Surgeon: [name], MD;  Location: [place] CV LAB;  Service: Cardiovascular;  Laterality: [name];  . TUBAL LIGATION       Current Outpatient Medications on File Prior to Visit  Medication Sig Dispense Refill  . ACCU-CHEK FASTCLIX LANCETS MISC USE 2 TIMES DAILY AS DIRECTED 102 each 2  . amLODipine (NORVASC) 5 MG tablet Take 1 tablet (5 mg total) by mouth daily. 90 tablet 1  . aspirin EC 81 MG tablet Take 1 tablet (81 mg total) by mouth daily. 90 tablet 3  . atorvastatin (LIPITOR) 20 MG tablet Take 1 tablet (20 mg total) by mouth daily. 90 tablet 3  . Blood Glucose Monitoring Suppl (ACCU-CHEK AVIVA PLUS) w/Device KIT 1 kit by Does not apply route daily. 1 kit 0  . carvedilol (COREG) 6.25 MG tablet Take 6.25 mg by mouth 2 (two) times daily with a meal.  11  . Cholecalciferol (VITAMIN D3) 125 MCG (5000 UT) TABS Take 1 tablet by mouth daily.    . ferrous sulfate 325 (65 FE) MG EC tablet Take 1 tablet (325 mg total) by mouth 3 (three) times daily with meals. 90 tablet 1  . glucose blood (ACCU-CHEK GUIDE) test strip USE 2 TIMES DAILY AS DIRECFTED 70 each 5  . lisinopril (PRINIVIL,ZESTRIL) 20 MG tablet Take 1 tablet (20 mg total) by mouth daily. 90 tablet 3  . metFORMIN (GLUCOPHAGE XR) 500 MG 24 hr tablet Take 1  tablet (500 mg total) by mouth daily with breakfast. 90 tablet 1  . traZODone (DESYREL) 50 MG tablet Take 2 tablets (100 mg total) by mouth at bedtime. 60 tablet 3   No current facility-administered medications on file prior to visit.     Allergies  Allergen Reactions  . Penicillins Anaphylaxis    Has patient had a PCN reaction causing immediate rash, facial/tongue/throat swelling, SOB or lightheadedness with hypotension: Yes Has patient had a PCN reaction causing severe rash involving mucus membranes or skin necrosis: Yes Has patient had a PCN reaction that required hospitalization Yes- went to ED, was not admitted Has patient had a PCN reaction occurring within the last 10 years: No- longer then 10 years If all of the above answers are "NO", then may proceed with Cephalosporin use.   . Tape Rash     Other reaction(s): Other (See Comments) Burns skin  . Other Rash    Other reaction(s): Other (See Comments) **EKG GEL PADS**  "Burns my skin" Pt states she is allergic to a blood pressure medication, unsure.  **EKG GEL PADS**  "Burns my skin"  . Latex Rash    Social History   Socioeconomic History  . Marital status: Single    Spouse name: Not on file  . Number of children: Not on file  . Years of education: Not on file  . Highest education level: Not on file  Occupational History  . Not on file  Social Needs  . Financial resource strain: Not on file  . Food insecurity:    Worry: Not on file    Inability: Not on file  . Transportation needs:    Medical: Not on file    Non-medical: Not on file  Tobacco Use  . Smoking status: Never Smoker  . Smokeless tobacco: Never Used  Substance and Sexual Activity  . Alcohol use: No  . Drug use: No  . Sexual activity: Yes    Birth control/protection: Surgical  Lifestyle  . Physical activity:    Days per week: Not on file    Minutes per session: Not on file  . Stress: Not on file  Relationships  . Social connections:    Talks on phone: Not on file    Gets together: Not on file    Attends religious service: Not on file    Active member of club or organization: Not on file    Attends meetings of clubs or organizations: Not on file    Relationship status: Not on file  . Intimate partner violence:    Fear of current or ex partner: Not on file    Emotionally abused: Not on file    Physically abused: Not on file    Forced sexual activity: Not on file  Other Topics Concern  . Not on file  Social History Narrative   Lives with boyfriend. On disability.     Family History  Problem Relation Age of Onset  . Cancer Other   . Hyperlipidemia Other   . Hypertension Other   . Asthma Other   . Diabetes Mother   . Diabetes Father     The following portions of the patient's history were reviewed and updated as appropriate:  allergies, current medications, past family history, past medical history, past social history, past surgical history and problem list.  Review of Systems Constitutional: negative Eyes: negative Ears, nose, mouth, throat, and face: negative Respiratory: negative Cardiovascular: negative Gastrointestinal: negative Genitourinary:negative Integument/breast: negative Hematologic/lymphatic: negative Musculoskeletal:positive for knee  pain Neurological: negative Behavioral/Psych: negative Endocrine: negative Allergic/Immunologic: negative   Objective:  BP (!) 156/75 (BP Location: Right Arm, Patient Position: Sitting, Cuff Size: Normal)   Pulse (!) 101   Resp 20   Ht _0  (1.499 m)   Wt 166 lb 6.4 oz (75.5 kg)   LMP 05/01/2018 (Exact Date)   BMI 33.61 kg/m  CONSTITUTIONAL: Well-developed, well-nourished female in no acute distress.  HENT:  Normocephalic, atraumatic, External right and left ear normal. Oropharynx is clear and moist EYES: Conjunctivae and EOM are normal. Pupils are equal, round, and reactive to light. No scleral icterus.  NECK: Normal range of motion, supple, no masses.  Normal thyroid.  SKIN: Skin is warm and dry. No rash noted. Not diaphoretic. No erythema. No pallor. May: Alert and oriented to person, place, and time. Normal reflexes, muscle tone coordination. No cranial nerve deficit noted. PSYCHIATRIC: Normal mood and affect. Normal behavior. Normal judgment and thought content. CARDIOVASCULAR: Normal heart rate noted, regular rhythm RESPIRATORY: Clear to auscultation bilaterally. Effort and breath sounds normal, no problems with respiration noted. BREASTS: Symmetric in size. No masses, skin changes, nipple drainage, or lymphadenopathy. ABDOMEN: Soft, normal bowel sounds, no distention noted.  Mild lower mid-abdomen tenderness, No rebound tenderness or guarding.  PELVIC: Normal appearing external genitalia; normal appearing vaginal mucosa and cervix.  No  abnormal discharge noted.  Pap smear obtained.  Enlarged uterine size of ~ 16 weeks size, no other palpable masses, no uterine or adnexal tenderness. MUSCULOSKELETAL: Normal range of motion. No tenderness.  No cyanosis, clubbing, or edema.  2+ distal pulses.   Assessment:  Annual gynecologic examination with pap smear   Plan:   Will follow up results of pap smear and manage accordingly. Mammogram in 02/2019. Outpatient pelvic U/S for uterine size discrepancy. Routine preventative health maintenance measures emphasized. Please refer to After Visit Summary for other counseling recommendations.   Laury Deep, CNM  05/10/2018 2:00 PM

## 2018-05-11 ENCOUNTER — Telehealth: Payer: Self-pay | Admitting: General Practice

## 2018-05-11 ENCOUNTER — Encounter: Payer: Self-pay | Admitting: Obstetrics and Gynecology

## 2018-05-11 LAB — CYTOLOGY - PAP
CHLAMYDIA, DNA PROBE: NEGATIVE
DIAGNOSIS: NEGATIVE
HPV: NOT DETECTED
Neisseria Gonorrhea: NEGATIVE

## 2018-05-11 LAB — CERVICOVAGINAL ANCILLARY ONLY
BACTERIAL VAGINITIS: POSITIVE — AB
CANDIDA VAGINITIS: POSITIVE — AB
TRICH (WINDOWPATH): NEGATIVE

## 2018-05-11 NOTE — Telephone Encounter (Signed)
Unable to reach patient via phone to notify of follow up appt with Laury Deep, CNM to discuss [name] results.  Message sent via [place] in regards to appt on 06/07/18 at 3:10pm.

## 2018-05-13 ENCOUNTER — Encounter (HOSPITAL_COMMUNITY): Payer: Self-pay | Admitting: *Deleted

## 2018-05-13 ENCOUNTER — Emergency Department (HOSPITAL_COMMUNITY): Payer: Medicaid Other

## 2018-05-13 ENCOUNTER — Observation Stay (HOSPITAL_COMMUNITY)
Admission: EM | Admit: 2018-05-13 | Discharge: 2018-05-14 | Disposition: A | Discharge status: Home / Self Care | Payer: Medicaid Other | Attending: Internal Medicine | Admitting: Internal Medicine

## 2018-05-13 ENCOUNTER — Other Ambulatory Visit: Payer: Self-pay

## 2018-05-13 DIAGNOSIS — B3731 Acute candidiasis of vulva and vagina: Secondary | ICD-10-CM

## 2018-05-13 DIAGNOSIS — N760 Acute vaginitis: Principal | ICD-10-CM

## 2018-05-13 DIAGNOSIS — Z6834 Body mass index (BMI) 34.0-34.9, adult: Secondary | ICD-10-CM | POA: Diagnosis not present

## 2018-05-13 DIAGNOSIS — Z880 Allergy status to penicillin: Secondary | ICD-10-CM | POA: Diagnosis not present

## 2018-05-13 DIAGNOSIS — Z7984 Long term (current) use of oral hypoglycemic drugs: Secondary | ICD-10-CM | POA: Insufficient documentation

## 2018-05-13 DIAGNOSIS — J45909 Unspecified asthma, uncomplicated: Secondary | ICD-10-CM | POA: Insufficient documentation

## 2018-05-13 DIAGNOSIS — I071 Rheumatic tricuspid insufficiency: Secondary | ICD-10-CM | POA: Diagnosis not present

## 2018-05-13 DIAGNOSIS — R29810 Facial weakness: Secondary | ICD-10-CM | POA: Diagnosis not present

## 2018-05-13 DIAGNOSIS — R000 Tachycardia, unspecified: Secondary | ICD-10-CM | POA: Diagnosis not present

## 2018-05-13 DIAGNOSIS — Z8249 Family history of ischemic heart disease and other diseases of the circulatory system: Secondary | ICD-10-CM | POA: Diagnosis not present

## 2018-05-13 DIAGNOSIS — I6523 Occlusion and stenosis of bilateral carotid arteries: Secondary | ICD-10-CM | POA: Diagnosis not present

## 2018-05-13 DIAGNOSIS — B373 Candidiasis of vulva and vagina: Secondary | ICD-10-CM

## 2018-05-13 DIAGNOSIS — Z888 Allergy status to other drugs, medicaments and biological substances status: Secondary | ICD-10-CM | POA: Insufficient documentation

## 2018-05-13 DIAGNOSIS — B9689 Other specified bacterial agents as the cause of diseases classified elsewhere: Secondary | ICD-10-CM

## 2018-05-13 DIAGNOSIS — G459 Transient cerebral ischemic attack, unspecified: Principal | ICD-10-CM | POA: Diagnosis present

## 2018-05-13 DIAGNOSIS — E119 Type 2 diabetes mellitus without complications: Secondary | ICD-10-CM | POA: Diagnosis not present

## 2018-05-13 DIAGNOSIS — I10 Essential (primary) hypertension: Secondary | ICD-10-CM | POA: Diagnosis not present

## 2018-05-13 DIAGNOSIS — E785 Hyperlipidemia, unspecified: Secondary | ICD-10-CM | POA: Diagnosis not present

## 2018-05-13 DIAGNOSIS — Q204 Double inlet ventricle: Secondary | ICD-10-CM | POA: Insufficient documentation

## 2018-05-13 DIAGNOSIS — R531 Weakness: Secondary | ICD-10-CM | POA: Diagnosis not present

## 2018-05-13 DIAGNOSIS — I270 Primary pulmonary hypertension: Secondary | ICD-10-CM | POA: Insufficient documentation

## 2018-05-13 DIAGNOSIS — Q210 Ventricular septal defect: Secondary | ICD-10-CM | POA: Insufficient documentation

## 2018-05-13 DIAGNOSIS — Z79899 Other long term (current) drug therapy: Secondary | ICD-10-CM | POA: Insufficient documentation

## 2018-05-13 DIAGNOSIS — R29818 Other symptoms and signs involving the nervous system: Secondary | ICD-10-CM | POA: Diagnosis not present

## 2018-05-13 DIAGNOSIS — Z7982 Long term (current) use of aspirin: Secondary | ICD-10-CM | POA: Insufficient documentation

## 2018-05-13 DIAGNOSIS — R4781 Slurred speech: Secondary | ICD-10-CM | POA: Diagnosis not present

## 2018-05-13 LAB — COMPREHENSIVE METABOLIC PANEL
ALT: 23 U/L (ref 0–44)
AST: 31 U/L (ref 15–41)
Albumin: 4 g/dL (ref 3.5–5.0)
Alkaline Phosphatase: 67 U/L (ref 38–126)
Anion gap: 7 (ref 5–15)
BUN: 12 mg/dL (ref 6–20)
CHLORIDE: 104 mmol/L (ref 98–111)
CO2: 20 mmol/L — ABNORMAL LOW (ref 22–32)
CREATININE: 0.78 mg/dL (ref 0.44–1.00)
Calcium: 8.6 mg/dL — ABNORMAL LOW (ref 8.9–10.3)
Glucose, Bld: 148 mg/dL — ABNORMAL HIGH (ref 70–99)
POTASSIUM: 3.4 mmol/L — AB (ref 3.5–5.1)
Sodium: 131 mmol/L — ABNORMAL LOW (ref 135–145)
TOTAL PROTEIN: 7.4 g/dL (ref 6.5–8.1)
Total Bilirubin: 0.7 mg/dL (ref 0.3–1.2)

## 2018-05-13 LAB — CBC
HCT: 36.3 % (ref 36.0–46.0)
Hemoglobin: 10.2 g/dL — ABNORMAL LOW (ref 12.0–15.0)
MCH: 21.2 pg — ABNORMAL LOW (ref 26.0–34.0)
MCHC: 28.1 g/dL — ABNORMAL LOW (ref 30.0–36.0)
MCV: 75.3 fL — ABNORMAL LOW (ref 80.0–100.0)
NRBC: 0 % (ref 0.0–0.2)
Platelets: 261 10*3/uL (ref 150–400)
RBC: 4.82 MIL/uL (ref 3.87–5.11)
RDW: 17.5 % — AB (ref 11.5–15.5)
WBC: 5.9 10*3/uL (ref 4.0–10.5)

## 2018-05-13 LAB — DIFFERENTIAL
Abs Immature Granulocytes: 0.01 10*3/uL (ref 0.00–0.07)
BASOS ABS: 0 10*3/uL (ref 0.0–0.1)
BASOS PCT: 0 %
EOS ABS: 0.1 10*3/uL (ref 0.0–0.5)
Eosinophils Relative: 2 %
IMMATURE GRANULOCYTES: 0 %
Lymphocytes Relative: 36 %
Lymphs Abs: 2.2 10*3/uL (ref 0.7–4.0)
MONO ABS: 0.7 10*3/uL (ref 0.1–1.0)
MONOS PCT: 13 %
NEUTROS PCT: 49 %
Neutro Abs: 2.9 10*3/uL (ref 1.7–7.7)

## 2018-05-13 LAB — CBG MONITORING, ED: Glucose-Capillary: 131 mg/dL — ABNORMAL HIGH (ref 70–99)

## 2018-05-13 LAB — I-STAT CHEM 8, ED
BUN: 13 mg/dL (ref 6–20)
CALCIUM ION: 1.1 mmol/L — AB (ref 1.15–1.40)
Chloride: 106 mmol/L (ref 98–111)
Creatinine, Ser: 0.8 mg/dL (ref 0.44–1.00)
Glucose, Bld: 144 mg/dL — ABNORMAL HIGH (ref 70–99)
HEMATOCRIT: 36 % (ref 36.0–46.0)
HEMOGLOBIN: 12.2 g/dL (ref 12.0–15.0)
Potassium: 3.6 mmol/L (ref 3.5–5.1)
Sodium: 138 mmol/L (ref 135–145)
TCO2: 23 mmol/L (ref 22–32)

## 2018-05-13 LAB — PROTIME-INR
INR: 1.03
PROTHROMBIN TIME: 13.4 s (ref 11.4–15.2)

## 2018-05-13 LAB — I-STAT TROPONIN, ED: TROPONIN I, POC: 0.01 ng/mL (ref 0.00–0.08)

## 2018-05-13 LAB — APTT: aPTT: 27 seconds (ref 24–36)

## 2018-05-13 LAB — I-STAT BETA HCG BLOOD, ED (MC, WL, AP ONLY): I-stat hCG, quantitative: <5 m[IU]/mL (ref <5)

## 2018-05-13 MED ORDER — IOPAMIDOL (ISOVUE-370) INJECTION 76%
75.0000 mL | Freq: Once | INTRAVENOUS | Status: AC | PRN
  Administered 2018-05-13: 75 mL via INTRAVENOUS

## 2018-05-13 MED ORDER — STROKE: EARLY STAGES OF RECOVERY BOOK
Freq: Once | Status: AC
  Administered 2018-05-14: 08:00:00
  Filled 2018-05-13: qty 1

## 2018-05-13 MED ORDER — ENOXAPARIN SODIUM 40 MG/0.4ML ~~LOC~~ SOLN
40.0000 mg | SUBCUTANEOUS | Status: DC
  Administered 2018-05-14: 40 mg via SUBCUTANEOUS
  Filled 2018-05-13: qty 0.4

## 2018-05-13 MED ORDER — FERROUS SULFATE 325 (65 FE) MG PO TABS
325.0000 mg | ORAL_TABLET | Freq: Three times a day (TID) | ORAL | Status: DC
  Administered 2018-05-14 (×2): 325 mg via ORAL
  Filled 2018-05-13 (×2): qty 1

## 2018-05-13 MED ORDER — ASPIRIN 325 MG PO TABS: 325.0000 mg | ORAL_TABLET | Freq: Every day | ORAL | Status: DC

## 2018-05-13 MED ORDER — ASPIRIN 300 MG RE SUPP: 300.0000 mg | Freq: Every day | RECTAL | Status: DC

## 2018-05-13 MED ORDER — ALBUTEROL SULFATE (2.5 MG/3ML) 0.083% IN NEBU: 3.0000 mL | INHALATION_SOLUTION | Freq: Four times a day (QID) | RESPIRATORY_TRACT | Status: DC | PRN

## 2018-05-13 MED ORDER — MOMETASONE FURO-FORMOTEROL FUM 200-5 MCG/ACT IN AERO
2.0000 | INHALATION_SPRAY | Freq: Two times a day (BID) | RESPIRATORY_TRACT | Status: DC
  Administered 2018-05-14: 2 via RESPIRATORY_TRACT
  Filled 2018-05-13: qty 8.8

## 2018-05-13 MED ORDER — ATORVASTATIN CALCIUM 20 MG PO TABS
20.0000 mg | ORAL_TABLET | Freq: Every day | ORAL | Status: DC
  Administered 2018-05-14: 20 mg via ORAL
  Filled 2018-05-13: qty 1

## 2018-05-13 MED ORDER — ACETAMINOPHEN 650 MG RE SUPP: 650.0000 mg | RECTAL | Status: DC | PRN

## 2018-05-13 MED ORDER — ACETAMINOPHEN 325 MG PO TABS
650.0000 mg | ORAL_TABLET | ORAL | Status: DC | PRN
  Administered 2018-05-14: 650 mg via ORAL
  Filled 2018-05-13: qty 2

## 2018-05-13 MED ORDER — INSULIN ASPART 100 UNIT/ML ~~LOC~~ SOLN
0.0000 [IU] | Freq: Three times a day (TID) | SUBCUTANEOUS | Status: DC
  Administered 2018-05-14: 3 [IU] via SUBCUTANEOUS
  Filled 2018-05-13: qty 1

## 2018-05-13 MED ORDER — ACETAMINOPHEN 160 MG/5ML PO SOLN: 650.0000 mg | ORAL | Status: DC | PRN

## 2018-05-13 MED ORDER — LORAZEPAM 1 MG PO TABS
0.5000 mg | ORAL_TABLET | Freq: Once | ORAL | Status: AC | PRN
  Administered 2018-05-14: 1 mg via ORAL
  Filled 2018-05-13: qty 1

## 2018-05-13 NOTE — ED Notes (Addendum)
Pt arrived via POV with complaints of slurred speech and L sided weakness since 8pm.  Reports history of stroke 8 months ago.  L arm drift.  Code Stroke activated on arrival and pt to bridge for EDP assessment/airway clearance.  Neuro Hospitalist at bridge on pt arrival.  Hand-off to [place], [place].

## 2018-05-13 NOTE — ED Notes (Signed)
Blood sent to main lab via tube station.

## 2018-05-13 NOTE — ED Notes (Signed)
Called lab to add on hiv blood test.

## 2018-05-13 NOTE — ED Triage Notes (Signed)
Pt was using the bathroom tonight, noticed weakness in her L arm then noticed slurred speech. Hx of "small stroke" 8 months ago. Pt also reports centralized, nonradiating chest pain that started at the same time other symptoms did

## 2018-05-13 NOTE — H&P (Signed)
History and Physical    Mackenzie Patterson EHO:[id number] DOB: 24-Jul-1970 DOA: 05/13/2018  PCP: Clent Demark, PA-C  Patient coming from: Home  I have personally briefly reviewed patient's old medical records in [place]  Chief Complaint: R facial droop, L arm weakness  HPI: Mackenzie Patterson is a 47 y.o. female with medical history significant of VSD, mild PAH, DM2, HTN.  Patient presents to ED with onset of slurred speech, R facial droop, L arm weakness at 8pm.  Reportedly had symptoms 8 months ago, told she had a TIA or stroke at that time.  No episodes in between then.  Not on anticoagulation.   ED Course: Seen by neurology as code stroke, recommends MRI and TIA admission.  Mild deficits and improving deficits, not TPA candidate.   Review of Systems: As per HPI otherwise 10 point review of systems negative.   Past Medical History:  Diagnosis Date  . Asthma   . Breast discharge 01/10/2017  . Congenital ventricular septal defect   . Diabetes mellitus without complication ([place])   . Heart murmur   . Hypertension   . Obesity   . Pulmonary hypertension (HCC)    mild   . Tricuspid regurgitation   . VSD (ventricular septal defect), perimembranous    Qp:Qs 1.7:1 by catheterization     Past Surgical History:  Procedure Laterality Date  . CARDIAC CATHETERIZATION [name] 10/30/2014   Procedure: Right Heart Cath;  Surgeon: [name], MD;  Location: [facility] INVASIVE CV LAB CUPID;  Service: Cardiovascular;  Laterality: [name];  . CHOLECYSTECTOMY  2018  . CYST EXCISION    . LEFT AND RIGHT HEART CATHETERIZATION WITH CORONARY ANGIOGRAM [name] 09/11/2013   Procedure: LEFT AND RIGHT HEART CATHETERIZATION WITH CORONARY ANGIOGRAM;  Surgeon: [name], MD;  Location: [facility];  Service: Cardiovascular;  Laterality: [name];  . RIGHT HEART CATH [name] 09/02/2016   Procedure: Right Heart Cath;  Surgeon: [name], MD;  Location: [place] CV LAB;  Service: Cardiovascular;  Laterality: [name];  .  TUBAL LIGATION       reports that she has never smoked. She has never used smokeless tobacco. She reports that she does not drink alcohol or use drugs.  Allergies  Allergen Reactions  . Penicillins Anaphylaxis    Has patient had a PCN reaction causing immediate rash, facial/tongue/throat swelling, SOB or lightheadedness with hypotension: Yes Has patient had a PCN reaction causing severe rash involving mucus membranes or skin necrosis: Yes Has patient had a PCN reaction that required hospitalization Yes- went to ED, was not admitted Has patient had a PCN reaction occurring within the last 10 years: No- longer then 10 years If all of the above answers are "NO", then may proceed with Cephalosporin use.   . Tape Rash    Other reaction(s): Other (See Comments) Burns skin  . Other Rash    Other reaction(s): Other (See Comments) **EKG GEL PADS**  "Burns my skin" Pt states she is allergic to a blood pressure medication, unsure.  **EKG GEL PADS**  "Burns my skin"  . Latex Rash    Family History  Problem Relation Age of Onset  . Cancer Other   . Hyperlipidemia Other   . Hypertension Other   . Asthma Other   . Diabetes Mother   . Diabetes Father      Prior to Admission medications   Medication Sig Start Date End Date Taking? Authorizing Provider  albuterol (PROVENTIL HFA;VENTOLIN HFA) 108 (90 Base) MCG/ACT  inhaler Inhale 2 puffs into the lungs every 6 (six) hours as needed for wheezing or shortness of breath. 05/10/18  Yes Clent Demark, PA-C  amLODipine (NORVASC) 5 MG tablet Take 1 tablet (5 mg total) by mouth daily. 04/12/18  Yes Clent Demark, PA-C  aspirin EC 81 MG tablet Take 1 tablet (81 mg total) by mouth daily. 04/12/18  Yes Clent Demark, PA-C  atorvastatin (LIPITOR) 20 MG tablet Take 1 tablet (20 mg total) by mouth daily. 04/13/18  Yes Clent Demark, PA-C  budesonide-formoterol [facility]) 160-4.5 MCG/ACT inhaler Inhale 2 puffs into the lungs 2 (two)  times daily. 05/10/18  Yes Clent Demark, PA-C  carvedilol (COREG) 6.25 MG tablet Take 6.25 mg by mouth 2 (two) times daily with a meal. 03/30/18  Yes [provider]  ferrous sulfate 325 (65 FE) MG EC tablet Take 1 tablet (325 mg total) by mouth 3 (three) times daily with meals. 04/13/18  Yes Clent Demark, PA-C  lisinopril (PRINIVIL,ZESTRIL) 20 MG tablet Take 1 tablet (20 mg total) by mouth daily. 04/12/18  Yes Clent Demark, PA-C  metFORMIN (GLUCOPHAGE XR) 500 MG 24 hr tablet Take 1 tablet (500 mg total) by mouth daily with breakfast. 04/12/18  Yes Clent Demark, PA-C  ACCU-CHEK FASTCLIX LANCETS MISC USE 2 TIMES DAILY AS DIRECTED 04/12/18   Clent Demark, PA-C  Blood Glucose Monitoring Suppl (ACCU-CHEK AVIVA PLUS) w/Device KIT 1 kit by Does not apply route daily. 12/19/16   Clent Demark, PA-C  glucose blood (ACCU-CHEK GUIDE) test strip USE 2 TIMES DAILY AS DIRECFTED 04/12/18   Clent Demark, PA-C    Physical Exam: Vitals:   05/13/18 2100 05/13/18 2130 05/13/18 2145 05/13/18 2215  BP: (!) 172/104 (!) 160/96 (!) 156/98   Pulse: 100 98 96 98  Resp: (!) 21 (!) 25 (!) 23 19  Temp:      TempSrc:      SpO2: 99% 100% 100% 100%  Weight:      Height:        Constitutional: NAD, calm, comfortable Eyes: PERRL, lids and conjunctivae normal ENMT: Mucous membranes are moist. Posterior pharynx clear of any exudate or lesions.Normal dentition. R facial droop, somewhat slurred speech Neck: normal, supple, no masses, no thyromegaly Respiratory: clear to auscultation bilaterally, no wheezing, no crackles. Normal respiratory effort. No accessory muscle use.  Cardiovascular: Loud murmur Abdomen: no tenderness, no masses palpated. No hepatosplenomegaly. Bowel sounds positive.  Musculoskeletal: no clubbing / cyanosis. No joint deformity upper and lower extremities. Good ROM, no contractures. Normal muscle tone.  Skin: no rashes, lesions, ulcers. No  induration Neurologic: Right-sided facial droop.  Slurred speech.  With both arms held out right the left one will lower but there is no pronation with it.  Good strength bilateral lower extremities. Psychiatric: Normal judgment and insight. Alert and oriented x 3. Normal mood.    Labs on Admission: I have personally reviewed following labs and imaging studies  CBC: Recent Labs  Lab 05/13/18 2033 05/13/18 2038  WBC 5.9  --   NEUTROABS 2.9  --   HGB 10.2* 12.2  HCT 36.3 36.0  MCV 75.3*  --   PLT 261  --    Basic Metabolic Panel: Recent Labs  Lab 05/13/18 2033 05/13/18 2038  NA 131* 138  K 3.4* 3.6  CL 104 106  CO2 20*  --   GLUCOSE 148* 144*  BUN 12 13  CREATININE 0.78 0.80  CALCIUM 8.6*  --  GFR: Estimated Creatinine Clearance: 78.4 mL/min (by C-G formula based on SCr of 0.8 mg/dL). Liver Function Tests: Recent Labs  Lab 05/13/18 2033  AST 31  ALT 23  ALKPHOS 67  BILITOT 0.7  PROT 7.4  ALBUMIN 4.0   No results for input(s): LIPASE, AMYLASE in the last 168 hours. No results for input(s): AMMONIA in the last 168 hours. Coagulation Profile: Recent Labs  Lab 05/13/18 2033  INR 1.03   Cardiac Enzymes: No results for input(s): CKTOTAL, CKMB, CKMBINDEX, TROPONINI in the last 168 hours. BNP (last 3 results) No results for input(s): PROBNP in the last 8760 hours. HbA1C: No results for input(s): HGBA1C in the last 72 hours. CBG: Recent Labs  Lab 05/13/18 2051  GLUCAP 131*   Lipid Profile: No results for input(s): CHOL, HDL, LDLCALC, TRIG, CHOLHDL, LDLDIRECT in the last 72 hours. Thyroid Function Tests: No results for input(s): TSH, T4TOTAL, FREET4, T3FREE, THYROIDAB in the last 72 hours. Anemia Panel: No results for input(s): VITAMINB12, FOLATE, FERRITIN, TIBC, IRON, RETICCTPCT in the last 72 hours. Urine analysis:    Component Value Date/Time   COLORURINE YELLOW 03/31/2016 1433   APPEARANCEUR CLOUDY (A) 03/31/2016 1433   LABSPEC 1.031 (H)  03/31/2016 1433   PHURINE 5.5 03/31/2016 1433   GLUCOSEU NEGATIVE 03/31/2016 1433   HGBUR NEGATIVE 03/31/2016 1433   BILIRUBINUR small 02/27/2018 0910   KETONESUR NEGATIVE 03/31/2016 1433   PROTEINUR Negative 02/27/2018 0910   PROTEINUR NEGATIVE 03/31/2016 1433   UROBILINOGEN 0.2 02/27/2018 0910   UROBILINOGEN 0.2 09/14/2014 1024   NITRITE negative 02/27/2018 0910   NITRITE NEGATIVE 03/31/2016 1433   LEUKOCYTESUR Negative 02/27/2018 0910    Radiological Exams on Admission: Ct Angio Head W Or Wo Contrast  Result Date: 05/13/2018 CLINICAL DATA:  47 year old female with crossed neurologic symptoms; ipsilateral face and contralateral body deficits. EXAM: CT ANGIOGRAPHY HEAD AND NECK TECHNIQUE: Multidetector CT imaging of the head and neck was performed using the standard protocol during bolus administration of intravenous contrast. Multiplanar CT image reconstructions and MIPs were obtained to evaluate the vascular anatomy. Carotid stenosis measurements (when applicable) are obtained utilizing NASCET criteria, using the distal internal carotid diameter as the denominator. CONTRAST:  77m ISOVUE-370 IOPAMIDOL (ISOVUE-370) INJECTION 76% COMPARISON:  Head CT without contrast 2034 hours today. Brain MRI, MRA head and neck 10/24/2017. FINDINGS: CTA NECK Skeleton: No acute osseous abnormality identified. Lower cervical disc and endplate degeneration. Upper chest: Negative. Other neck: Negative. Aortic arch: 3 vessel arch configuration.  No arch atherosclerosis. Right carotid system: Possible mild soft plaque at the right ICA bulb but no stenosis. Left carotid system: Negative. Vertebral arteries: No proximal right subclavian or right vertebral artery origin plaque or stenosis. The right vertebral artery is patent to the skull base without stenosis. No proximal left subclavian artery or left vertebral artery origin plaque or stenosis. The left vertebral artery is non dominant and patent to the skull base  without stenosis. CTA HEAD Posterior circulation: Both distal vertebral arteries are patent. The left V4 segment distal to PICA appears mildly irregular, but the MRA earlier this year suggests the vessel merely tapers beyond the PICA. No distal right vertebral stenosis. Patent but diminutive basilar artery. AICA origins remain patent. No focal basilar stenosis. SCA and left PCA origins are stable and within normal limits. Fetal type right PCA. Left Posterior communicating artery is diminutive or absent. Bilateral PCA branches are within normal limits. Anterior circulation: Both ICA siphons are patent, the right is mildly dominant owing to the  right PCA and the ACA anatomy. No siphon plaque or stenosis. Ophthalmic and right posterior communicating artery origins are normal. Patent carotid termini. The right A1 is dominant and the left is diminutive or absent. Anterior communicating artery and bilateral ACA branches are within normal limits. Left MCA M1 segment and left MCA bifurcation are patent without stenosis. The left MCA branches may be mildly irregular but no branch occlusion is identified. The right M1 and right MCA bifurcation are patent without stenosis. Distal right MCA branches appear mildly irregular but no branch occlusion is identified. Venous sinuses: Patent. Anatomic variants: Dominant right A1. Dominant right vertebral artery. Fetal right PCA origin. Review of the MIP images confirms the above findings IMPRESSION: 1. Negative for large vessel occlusion. This was discussed by telephone with Dr. Roland Rack on 05/13/2018 at 2056 hours. 2. Minimal if any large vessel atherosclerosis in the head and neck. Questionable intracranial atherosclerosis affecting bilateral distal branches, but no proximal intracranial stenosis identified. Electronically Signed   By: Genevie Ann M.D.   On: 05/13/2018 21:18   Ct Angio Neck W Or Wo Contrast  Result Date: 05/13/2018 CLINICAL DATA:  47 year old female with  crossed neurologic symptoms; ipsilateral face and contralateral body deficits. EXAM: CT ANGIOGRAPHY HEAD AND NECK TECHNIQUE: Multidetector CT imaging of the head and neck was performed using the standard protocol during bolus administration of intravenous contrast. Multiplanar CT image reconstructions and MIPs were obtained to evaluate the vascular anatomy. Carotid stenosis measurements (when applicable) are obtained utilizing NASCET criteria, using the distal internal carotid diameter as the denominator. CONTRAST:  38m ISOVUE-370 IOPAMIDOL (ISOVUE-370) INJECTION 76% COMPARISON:  Head CT without contrast 2034 hours today. Brain MRI, MRA head and neck 10/24/2017. FINDINGS: CTA NECK Skeleton: No acute osseous abnormality identified. Lower cervical disc and endplate degeneration. Upper chest: Negative. Other neck: Negative. Aortic arch: 3 vessel arch configuration.  No arch atherosclerosis. Right carotid system: Possible mild soft plaque at the right ICA bulb but no stenosis. Left carotid system: Negative. Vertebral arteries: No proximal right subclavian or right vertebral artery origin plaque or stenosis. The right vertebral artery is patent to the skull base without stenosis. No proximal left subclavian artery or left vertebral artery origin plaque or stenosis. The left vertebral artery is non dominant and patent to the skull base without stenosis. CTA HEAD Posterior circulation: Both distal vertebral arteries are patent. The left V4 segment distal to PICA appears mildly irregular, but the MRA earlier this year suggests the vessel merely tapers beyond the PICA. No distal right vertebral stenosis. Patent but diminutive basilar artery. AICA origins remain patent. No focal basilar stenosis. SCA and left PCA origins are stable and within normal limits. Fetal type right PCA. Left Posterior communicating artery is diminutive or absent. Bilateral PCA branches are within normal limits. Anterior circulation: Both ICA siphons  are patent, the right is mildly dominant owing to the right PCA and the ACA anatomy. No siphon plaque or stenosis. Ophthalmic and right posterior communicating artery origins are normal. Patent carotid termini. The right A1 is dominant and the left is diminutive or absent. Anterior communicating artery and bilateral ACA branches are within normal limits. Left MCA M1 segment and left MCA bifurcation are patent without stenosis. The left MCA branches may be mildly irregular but no branch occlusion is identified. The right M1 and right MCA bifurcation are patent without stenosis. Distal right MCA branches appear mildly irregular but no branch occlusion is identified. Venous sinuses: Patent. Anatomic variants: Dominant right A1. Dominant  right vertebral artery. Fetal right PCA origin. Review of the MIP images confirms the above findings IMPRESSION: 1. Negative for large vessel occlusion. This was discussed by telephone with Dr. Roland Rack on 05/13/2018 at 2056 hours. 2. Minimal if any large vessel atherosclerosis in the head and neck. Questionable intracranial atherosclerosis affecting bilateral distal branches, but no proximal intracranial stenosis identified. Electronically Signed   By: Genevie Ann M.D.   On: 05/13/2018 21:18   Ct Head Code Stroke Wo Contrast  Result Date: 05/13/2018 CLINICAL DATA:  Code stroke.  47 year old female with facial droop. EXAM: CT HEAD WITHOUT CONTRAST TECHNIQUE: Contiguous axial images were obtained from the base of the skull through the vertex without intravenous contrast. COMPARISON:  Head CT and brain MRI with head and neck MRA 10/24/2017. FINDINGS: Brain: Stable cerebral volume. Mild dystrophic calcifications in both globus pallidus. There is a small chronic infarct in the left superior cerebellum on series 3, image 10. Otherwise stable and negative gray-white matter differentiation throughout the brain. No midline shift, ventriculomegaly, mass effect, evidence of mass  lesion, intracranial hemorrhage or evidence of cortically based acute infarction. Vascular: No suspicious intracranial vascular hyperdensity. Skull: Negative. Sinuses/Orbits: Visualized paranasal sinuses and mastoids are stable and well pneumatized. Other: Visualized orbits and scalp soft tissues are within normal limits. ASPECTS ([place] Stroke Program Early CT Score) - Ganglionic level infarction (caudate, lentiform nuclei, internal capsule, insula, M1-M3 cortex): 7 - Supraganglionic infarction (M4-M6 cortex): 3 Total score (0-10 with 10 being normal): 10 IMPRESSION: 1. Stable noncontrast CT appearance of the brain since April, negative aside from a small chronic left cerebellar infarct. 2. ASPECTS is 10. 3. These results were communicated to Dr. Leonel Ramsay at 8:42 pmon 11/17/2019by text page via the [facility] messaging system. Electronically Signed   By: Genevie Ann M.D.   On: 05/13/2018 20:43    EKG: Independently reviewed.  Assessment/Plan Principal Problem:   TIA (transient ischemic attack) Active Problems:   Essential hypertension   Diabetes mellitus ([place])    1. TIA vs stroke - 1. Stroke pathway 2. MRI brain 3. 2d echo 4. ASA 325 5. Neuro saw patient, will put consult in chart 6. Tele monitor 7. A1C FLP 2. HTN - 1. Holding home BP meds and allow permissive HTN 3. DM2 - 1. Hold metformin 2. Mod scale SSI AC  DVT prophylaxis: Lovenox Code Status: Full Family Communication: Family at bedside Disposition Plan: Home after admit Consults called: Neuro Admission status: Place in obs   GARDNER, JARED M. DO [facility] Pager [phone] Only works nights!  If 7AM-7PM, please contact the primary day team physician taking care of patient  [email or web address] Password [email or web address]  05/13/2018, 10:59 PM

## 2018-05-13 NOTE — ED Provider Notes (Signed)
Mackenzie Patterson Provider Note   CSN: [id number] Arrival date & time: 05/13/18  2019   An emergency Patterson physician performed an initial assessment on this suspected stroke patient at 2021.  History   Chief Complaint Chief Complaint  Patient presents with  . Code Stroke    HPI Mackenzie Patterson is a 47 y.o. female.  HPI Patient came in as a code stroke.  Met by myself and Dr. Leonel Ramsay at the bridge.  Came through triage called as a code stroke by them.  Went to the bathroom and at 8:00 developed slurred speech and right-sided facial droop with left-sided arm weakness.  Reportedly had symptoms 8 months ago and was told she had a TIA or stroke at that time.  Has not had episodes in between.  No headache.  She is not on anticoagulation.  Does have a known VSD. Past Medical History:  Diagnosis Date  . Asthma   . Breast discharge 01/10/2017  . Congenital ventricular septal defect   . Diabetes mellitus without complication ([place])   . Heart murmur   . Hypertension   . Obesity   . Pulmonary hypertension (HCC)    mild   . Tricuspid regurgitation   . VSD (ventricular septal defect), perimembranous    Qp:Qs 1.7:1 by catheterization     Patient Active Problem List   Diagnosis Date Noted  . Enlarged uterus 05/10/2018  . 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 TIA (transient ischemic attack) 10/24/2017  . Menorrhagia 12/12/2016  . Ventricular septal defect (VSD), membranous 09/02/2016  . Pulmonary hypertension, primary ([place]) 08/23/2016  . Chest tightness 08/03/2016  . Cough 08/03/2016  . Dyspnea and respiratory abnormality 04/16/2014  . Tricuspid regurgitation   . Asthma   . VSD (ventricular septal defect), perimembranous 09/03/2013  . Pulmonary hypertension ([place]) 09/03/2013  . Essential hypertension 08/06/2013  . Diabetes mellitus ([place]) 08/06/2013  . Obesity 08/06/2013  . Hypertension 07/27/2012  . Awareness of heartbeats  07/27/2012  . 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 Perimembranous ventricular septal defect 07/27/2012    Past Surgical History:  Procedure Laterality Date  . CARDIAC CATHETERIZATION [name] 10/30/2014   Procedure: Right Heart Cath;  Surgeon: [name], MD;  Location: [facility] INVASIVE CV LAB CUPID;  Service: Cardiovascular;  Laterality: [name];  . CHOLECYSTECTOMY  2018  . CYST EXCISION    . LEFT AND RIGHT HEART CATHETERIZATION WITH CORONARY ANGIOGRAM [name] 09/11/2013   Procedure: LEFT AND RIGHT HEART CATHETERIZATION WITH CORONARY ANGIOGRAM;  Surgeon: [name], MD;  Location: [facility] CATH LAB;  Service: Cardiovascular;  Laterality: [name];  . RIGHT HEART CATH [name] 09/02/2016   Procedure: Right Heart Cath;  Surgeon: [name], MD;  Location: [place] CV LAB;  Service: Cardiovascular;  Laterality: [name];  . TUBAL LIGATION       OB History    Gravida  3   Para  2   Term  1   Preterm  1   AB  1   Living  3     SAB  0   TAB  1   Ectopic  0   Multiple  1   Live Births  3            Home Medications    Prior to Admission medications   Medication Sig Start Date End Date Taking? Authorizing Provider  albuterol (PROVENTIL HFA;VENTOLIN HFA) 108 (90 Base) MCG/ACT inhaler Inhale 2 puffs into the lungs every 6 (six) hours  as needed for wheezing or shortness of breath. 05/10/18  Yes Clent Demark, PA-C  amLODipine (NORVASC) 5 MG tablet Take 1 tablet (5 mg total) by mouth daily. 04/12/18  Yes Clent Demark, PA-C  aspirin EC 81 MG tablet Take 1 tablet (81 mg total) by mouth daily. 04/12/18  Yes Clent Demark, PA-C  atorvastatin (LIPITOR) 20 MG tablet Take 1 tablet (20 mg total) by mouth daily. 04/13/18  Yes Clent Demark, PA-C  budesonide-formoterol [facility]) 160-4.5 MCG/ACT inhaler Inhale 2 puffs into the lungs 2 (two) times daily. 05/10/18  Yes Clent Demark, PA-C  carvedilol (COREG) 6.25 MG tablet Take 6.25 mg by mouth 2 (two) times daily with a  meal. 03/30/18  Yes [provider]  ferrous sulfate 325 (65 FE) MG EC tablet Take 1 tablet (325 mg total) by mouth 3 (three) times daily with meals. 04/13/18  Yes Clent Demark, PA-C  lisinopril (PRINIVIL,ZESTRIL) 20 MG tablet Take 1 tablet (20 mg total) by mouth daily. 04/12/18  Yes Clent Demark, PA-C  metFORMIN (GLUCOPHAGE XR) 500 MG 24 hr tablet Take 1 tablet (500 mg total) by mouth daily with breakfast. 04/12/18  Yes Clent Demark, PA-C  ACCU-CHEK FASTCLIX LANCETS MISC USE 2 TIMES DAILY AS DIRECTED 04/12/18   Clent Demark, PA-C  Blood Glucose Monitoring Suppl (ACCU-CHEK AVIVA PLUS) w/Device KIT 1 kit by Does not apply route daily. 12/19/16   Clent Demark, PA-C  glucose blood (ACCU-CHEK GUIDE) test strip USE 2 TIMES DAILY AS DIRECFTED 04/12/18   Clent Demark, PA-C  traZODone (DESYREL) 50 MG tablet Take 2 tablets (100 mg total) by mouth at bedtime. Patient not taking: Reported on 05/13/2018 04/12/18   Clent Demark, PA-C    Family History Family History  Problem Relation Age of Onset  . Cancer Other   . Hyperlipidemia Other   . Hypertension Other   . Asthma Other   . Diabetes Mother   . Diabetes Father     Social History Social History   Tobacco Use  . Smoking status: Never Smoker  . Smokeless tobacco: Never Used  Substance Use Topics  . Alcohol use: No  . Drug use: No     Allergies   Penicillins; Tape; Other; and Latex   Review of Systems Review of Systems  Constitutional: Negative for appetite change.  HENT: Negative for congestion.   Respiratory: Negative for shortness of breath.   Cardiovascular: Negative for chest pain.  Gastrointestinal: Negative for abdominal pain.  Genitourinary: Negative for flank pain.  Musculoskeletal: Negative for back pain.  Skin: Negative for rash.  Neurological: Positive for speech difficulty and weakness. Negative for numbness.  Psychiatric/Behavioral: Negative for confusion.      Physical Exam Updated Vital Signs BP (!) 156/98   Pulse 98   Temp 98.7 F (37.1 C) (Oral)   Resp 19   Ht '4\' 11"'  (1.499 m)   Wt 76.4 kg   LMP 05/01/2018 (Exact Date)   SpO2 100%   BMI 34.02 kg/m   Physical Exam  Constitutional: She is oriented to person, place, and time. She appears well-developed.  HENT:  Right-sided facial droop.  Somewhat slurred speech.  Eyes: Pupils are equal, round, and reactive to light. EOM are normal.  Neck: Neck supple.  Cardiovascular: Normal rate.  Murmur heard. Loud systolic murmur  Pulmonary/Chest: Effort normal.  Abdominal: There is no tenderness.  Musculoskeletal: She exhibits no edema.  Neurological: She is alert and oriented to person,  place, and time.  Right-sided facial droop.  Slurred speech.  With both arms held out right the left one will lower but there is no pronation with it.  Good strength bilateral lower extremities.  Complete NIH scoring done by neurology.  Skin: Skin is warm. Capillary refill takes less than 2 seconds.     ED Treatments / Results  Labs (all labs ordered are listed, but only abnormal results are displayed) Labs Reviewed  CBC - Abnormal; Notable for the following components:      Result Value   Hemoglobin 10.2 (*)    MCV 75.3 (*)    MCH 21.2 (*)    MCHC 28.1 (*)    RDW 17.5 (*)    All other components within normal limits  COMPREHENSIVE METABOLIC PANEL - Abnormal; Notable for the following components:   Sodium 131 (*)    Potassium 3.4 (*)    CO2 20 (*)    Glucose, Bld 148 (*)    Calcium 8.6 (*)    All other components within normal limits  CBG MONITORING, ED - Abnormal; Notable for the following components:   Glucose-Capillary 131 (*)    All other components within normal limits  I-STAT CHEM 8, ED - Abnormal; Notable for the following components:   Glucose, Bld 144 (*)    Calcium, Ion 1.10 (*)    All other components within normal limits  PROTIME-INR  APTT  DIFFERENTIAL  I-STAT TROPONIN,  ED  I-STAT BETA HCG BLOOD, ED ([facility], [facility], [facility] ONLY)    EKG EKG Interpretation  Date/Time:  Sunday May 13 2018 20:51:08 EST Ventricular Rate:  108 PR Interval:    QRS Duration: 83 QT Interval:  339 QTC Calculation: 455 R Axis:   100 Text Interpretation:  Sinus tachycardia Right axis deviation Confirmed by Davonna Belling [phone]) on 05/13/2018 8:56:43 PM   Radiology Ct Angio Head W Or Wo Contrast  Result Date: 05/13/2018 CLINICAL DATA:  47 year old female with crossed neurologic symptoms; ipsilateral face and contralateral body deficits. EXAM: CT ANGIOGRAPHY HEAD AND NECK TECHNIQUE: Multidetector CT imaging of the head and neck was performed using the standard protocol during bolus administration of intravenous contrast. Multiplanar CT image reconstructions and MIPs were obtained to evaluate the vascular anatomy. Carotid stenosis measurements (when applicable) are obtained utilizing NASCET criteria, using the distal internal carotid diameter as the denominator. CONTRAST:  17m ISOVUE-370 IOPAMIDOL (ISOVUE-370) INJECTION 76% COMPARISON:  Head CT without contrast 2034 hours today. Brain MRI, MRA head and neck 10/24/2017. FINDINGS: CTA NECK Skeleton: No acute osseous abnormality identified. Lower cervical disc and endplate degeneration. Upper chest: Negative. Other neck: Negative. Aortic arch: 3 vessel arch configuration.  No arch atherosclerosis. Right carotid system: Possible mild soft plaque at the right ICA bulb but no stenosis. Left carotid system: Negative. Vertebral arteries: No proximal right subclavian or right vertebral artery origin plaque or stenosis. The right vertebral artery is patent to the skull base without stenosis. No proximal left subclavian artery or left vertebral artery origin plaque or stenosis. The left vertebral artery is non dominant and patent to the skull base without stenosis. CTA HEAD Posterior circulation: Both distal vertebral arteries are patent. The left V4  segment distal to PICA appears mildly irregular, but the MRA earlier this year suggests the vessel merely tapers beyond the PICA. No distal right vertebral stenosis. Patent but diminutive basilar artery. AICA origins remain patent. No focal basilar stenosis. SCA and left PCA origins are stable and within normal limits. Fetal type right  PCA. Left Posterior communicating artery is diminutive or absent. Bilateral PCA branches are within normal limits. Anterior circulation: Both ICA siphons are patent, the right is mildly dominant owing to the right PCA and the ACA anatomy. No siphon plaque or stenosis. Ophthalmic and right posterior communicating artery origins are normal. Patent carotid termini. The right A1 is dominant and the left is diminutive or absent. Anterior communicating artery and bilateral ACA branches are within normal limits. Left MCA M1 segment and left MCA bifurcation are patent without stenosis. The left MCA branches may be mildly irregular but no branch occlusion is identified. The right M1 and right MCA bifurcation are patent without stenosis. Distal right MCA branches appear mildly irregular but no branch occlusion is identified. Venous sinuses: Patent. Anatomic variants: Dominant right A1. Dominant right vertebral artery. Fetal right PCA origin. Review of the MIP images confirms the above findings IMPRESSION: 1. Negative for large vessel occlusion. This was discussed by telephone with Dr. Roland Rack on 05/13/2018 at 2056 hours. 2. Minimal if any large vessel atherosclerosis in the head and neck. Questionable intracranial atherosclerosis affecting bilateral distal branches, but no proximal intracranial stenosis identified. Electronically Signed   By: Genevie Ann M.D.   On: 05/13/2018 21:18   Ct Angio Neck W Or Wo Contrast  Result Date: 05/13/2018 CLINICAL DATA:  47 year old female with crossed neurologic symptoms; ipsilateral face and contralateral body deficits. EXAM: CT ANGIOGRAPHY HEAD  AND NECK TECHNIQUE: Multidetector CT imaging of the head and neck was performed using the standard protocol during bolus administration of intravenous contrast. Multiplanar CT image reconstructions and MIPs were obtained to evaluate the vascular anatomy. Carotid stenosis measurements (when applicable) are obtained utilizing NASCET criteria, using the distal internal carotid diameter as the denominator. CONTRAST:  76m ISOVUE-370 IOPAMIDOL (ISOVUE-370) INJECTION 76% COMPARISON:  Head CT without contrast 2034 hours today. Brain MRI, MRA head and neck 10/24/2017. FINDINGS: CTA NECK Skeleton: No acute osseous abnormality identified. Lower cervical disc and endplate degeneration. Upper chest: Negative. Other neck: Negative. Aortic arch: 3 vessel arch configuration.  No arch atherosclerosis. Right carotid system: Possible mild soft plaque at the right ICA bulb but no stenosis. Left carotid system: Negative. Vertebral arteries: No proximal right subclavian or right vertebral artery origin plaque or stenosis. The right vertebral artery is patent to the skull base without stenosis. No proximal left subclavian artery or left vertebral artery origin plaque or stenosis. The left vertebral artery is non dominant and patent to the skull base without stenosis. CTA HEAD Posterior circulation: Both distal vertebral arteries are patent. The left V4 segment distal to PICA appears mildly irregular, but the MRA earlier this year suggests the vessel merely tapers beyond the PICA. No distal right vertebral stenosis. Patent but diminutive basilar artery. AICA origins remain patent. No focal basilar stenosis. SCA and left PCA origins are stable and within normal limits. Fetal type right PCA. Left Posterior communicating artery is diminutive or absent. Bilateral PCA branches are within normal limits. Anterior circulation: Both ICA siphons are patent, the right is mildly dominant owing to the right PCA and the ACA anatomy. No siphon plaque or  stenosis. Ophthalmic and right posterior communicating artery origins are normal. Patent carotid termini. The right A1 is dominant and the left is diminutive or absent. Anterior communicating artery and bilateral ACA branches are within normal limits. Left MCA M1 segment and left MCA bifurcation are patent without stenosis. The left MCA branches may be mildly irregular but no branch occlusion is identified. The right  M1 and right MCA bifurcation are patent without stenosis. Distal right MCA branches appear mildly irregular but no branch occlusion is identified. Venous sinuses: Patent. Anatomic variants: Dominant right A1. Dominant right vertebral artery. Fetal right PCA origin. Review of the MIP images confirms the above findings IMPRESSION: 1. Negative for large vessel occlusion. This was discussed by telephone with Dr. Roland Rack on 05/13/2018 at 2056 hours. 2. Minimal if any large vessel atherosclerosis in the head and neck. Questionable intracranial atherosclerosis affecting bilateral distal branches, but no proximal intracranial stenosis identified. Electronically Signed   By: Genevie Ann M.D.   On: 05/13/2018 21:18   Ct Head Code Stroke Wo Contrast  Result Date: 05/13/2018 CLINICAL DATA:  Code stroke.  47 year old female with facial droop. EXAM: CT HEAD WITHOUT CONTRAST TECHNIQUE: Contiguous axial images were obtained from the base of the skull through the vertex without intravenous contrast. COMPARISON:  Head CT and brain MRI with head and neck MRA 10/24/2017. FINDINGS: Brain: Stable cerebral volume. Mild dystrophic calcifications in both globus pallidus. There is a small chronic infarct in the left superior cerebellum on series 3, image 10. Otherwise stable and negative gray-white matter differentiation throughout the brain. No midline shift, ventriculomegaly, mass effect, evidence of mass lesion, intracranial hemorrhage or evidence of cortically based acute infarction. Vascular: No suspicious  intracranial vascular hyperdensity. Skull: Negative. Sinuses/Orbits: Visualized paranasal sinuses and mastoids are stable and well pneumatized. Other: Visualized orbits and scalp soft tissues are within normal limits. ASPECTS ([place] Stroke Program Early CT Score) - Ganglionic level infarction (caudate, lentiform nuclei, internal capsule, insula, M1-M3 cortex): 7 - Supraganglionic infarction (M4-M6 cortex): 3 Total score (0-10 with 10 being normal): 10 IMPRESSION: 1. Stable noncontrast CT appearance of the brain since April, negative aside from a small chronic left cerebellar infarct. 2. ASPECTS is 10. 3. These results were communicated to Dr. Leonel Ramsay at 8:42 pmon 11/17/2019by text page via the [facility] messaging system. Electronically Signed   By: Genevie Ann M.D.   On: 05/13/2018 20:43    Procedures Procedures (including critical care time)  Medications Ordered in ED Medications  iopamidol (ISOVUE-370) 76 % injection 75 mL (75 mLs Intravenous Contrast Given 05/13/18 2039)     Initial Impression / Assessment and Plan / ED Course  I have reviewed the triage vital signs and the nursing notes.  Pertinent labs & imaging results that were available during my care of the patient were reviewed by me and considered in my medical decision making (see chart for details).     Patient with difficulty speaking and possible left-sided weakness.  Right-sided facial droop.  Head CT and CTA overall reassuring.  No clear stroke or large vessel occlusion but does have some mild vascular irregularities.  Neurology is seen patient.  Recommends MRI and TIA admission.  May need some more work-up depending on results of MRI but has had a recent TIA work-up also. Not a TPA candidate due to both mild deficits and improving of deficits. Final Clinical Impressions(s) / ED Diagnoses   Final diagnoses:  TIA (transient ischemic attack)    ED Discharge Orders    None       Davonna Belling, MD 05/13/18 2225

## 2018-05-14 ENCOUNTER — Observation Stay (HOSPITAL_COMMUNITY): Payer: Medicaid Other

## 2018-05-14 ENCOUNTER — Observation Stay (HOSPITAL_BASED_OUTPATIENT_CLINIC_OR_DEPARTMENT_OTHER): Payer: Medicaid Other

## 2018-05-14 DIAGNOSIS — I370 Nonrheumatic pulmonary valve stenosis: Secondary | ICD-10-CM

## 2018-05-14 DIAGNOSIS — I361 Nonrheumatic tricuspid (valve) insufficiency: Secondary | ICD-10-CM

## 2018-05-14 DIAGNOSIS — R29810 Facial weakness: Secondary | ICD-10-CM | POA: Diagnosis not present

## 2018-05-14 DIAGNOSIS — R4781 Slurred speech: Secondary | ICD-10-CM | POA: Diagnosis not present

## 2018-05-14 DIAGNOSIS — I10 Essential (primary) hypertension: Secondary | ICD-10-CM

## 2018-05-14 DIAGNOSIS — E119 Type 2 diabetes mellitus without complications: Secondary | ICD-10-CM | POA: Diagnosis not present

## 2018-05-14 DIAGNOSIS — G459 Transient cerebral ischemic attack, unspecified: Secondary | ICD-10-CM | POA: Diagnosis not present

## 2018-05-14 LAB — LIPID PANEL
CHOL/HDL RATIO: 3.4 ratio
CHOLESTEROL: 159 mg/dL (ref 0–200)
HDL: 47 mg/dL (ref >40)
LDL CALC: 92 mg/dL (ref 0–99)
Triglycerides: 102 mg/dL (ref <150)
VLDL: 20 mg/dL (ref 0–40)

## 2018-05-14 LAB — GLUCOSE, CAPILLARY
GLUCOSE-CAPILLARY: 131 mg/dL — AB (ref 70–99)
Glucose-Capillary: 175 mg/dL — ABNORMAL HIGH (ref 70–99)
Glucose-Capillary: 135 mg/dL — ABNORMAL HIGH (ref 70–99)

## 2018-05-14 LAB — RAPID URINE DRUG SCREEN, HOSP PERFORMED
Amphetamines: NOT DETECTED
BARBITURATES: NOT DETECTED
Benzodiazepines: NOT DETECTED
Cocaine: NOT DETECTED
Opiates: NOT DETECTED
Tetrahydrocannabinol: NOT DETECTED

## 2018-05-14 LAB — HIV ANTIBODY (ROUTINE TESTING W REFLEX): HIV SCREEN 4TH GENERATION: NONREACTIVE

## 2018-05-14 LAB — HEMOGLOBIN A1C
Hgb A1c MFr Bld: 6.1 % — ABNORMAL HIGH (ref 4.8–5.6)
Mean Plasma Glucose: 128.37 mg/dL

## 2018-05-14 LAB — ECHOCARDIOGRAM COMPLETE
HEIGHTINCHES: 59 in
WEIGHTICAEL: 2694.9 [oz_av]

## 2018-05-14 MED ORDER — ASPIRIN EC 81 MG PO TBEC
81.0000 mg | DELAYED_RELEASE_TABLET | Freq: Every day | ORAL | Status: DC
  Administered 2018-05-14: 81 mg via ORAL
  Filled 2018-05-14: qty 1

## 2018-05-14 MED ORDER — CLOPIDOGREL BISULFATE 75 MG PO TABS: 75.0000 mg | ORAL_TABLET | Freq: Every day | ORAL | Status: DC

## 2018-05-14 MED ORDER — CLOPIDOGREL BISULFATE 300 MG PO TABS
300.0000 mg | ORAL_TABLET | Freq: Once | ORAL | Status: AC
  Administered 2018-05-14: 300 mg via ORAL
  Filled 2018-05-14: qty 1

## 2018-05-14 MED ORDER — METRONIDAZOLE 500 MG PO TABS: 500.0000 mg | ORAL_TABLET | Freq: Two times a day (BID) | ORAL | 0 refills | Status: DC

## 2018-05-14 MED ORDER — CLOPIDOGREL BISULFATE 75 MG PO TABS: 75.0000 mg | ORAL_TABLET | Freq: Every day | ORAL | 0 refills | Status: AC

## 2018-05-14 MED ORDER — FLUCONAZOLE 150 MG PO TABS: 150.0000 mg | ORAL_TABLET | Freq: Once | ORAL | 0 refills | Status: AC

## 2018-05-14 NOTE — Telephone Encounter (Signed)
-----   Message from [place], [place] sent at 05/13/2018 12:35 PM EST ----- Send Rx's for Flagyl 500 mg po BID x 7 days then have patient take  Diflucan 150 mg x 1 after completing Flagyl course.  Thanks!

## 2018-05-14 NOTE — Evaluation (Signed)
Occupational Therapy Evaluation Patient Details Name: Mackenzie Patterson MRN: [id number] DOB: 1970-11-28 Today's Date: 05/14/2018    History of Present Illness Pt is a 47 y/o R HD female admitted w/ Dx: TIA with right facial droop and L sided weakness & slurred speech. Pt spouse states that L sided weakness was from previous admission. 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 PMH includes HTN, DM, pulmonary HTN, VSD, Asthma.    Clinical Impression   Pt admitted as above currently at or near prior level of function per pt and significant other report. Pt speech was clear & at normal rate and then varied at times becoming more or less slurred during assessment, pt MD is aware of this as well. Pt is overall Mod I - min guard assist for ADL's/shower transfers. Will sign off acute OT at this time.    Follow Up Recommendations  No OT follow up;Supervision - Intermittent    Equipment Recommendations  None recommended by OT    Recommendations for Other Services       Precautions / Restrictions        Mobility Bed Mobility Overal bed mobility: Independent                Transfers Overall transfer level: Modified independent               General transfer comment: Increaesd time, no physical assist for sit to stand or SPT. Supervision-Min guard A for shower transfer for safety & pt reports of RLE weakness    Balance Overall balance assessment: Mild deficits observed, not formally tested Sitting-balance support: No upper extremity supported;Feet supported Sitting balance-Leahy Scale: Normal     Standing balance support: No upper extremity supported;During functional activity Standing balance-Leahy Scale: Fair Standing balance comment: Static stand WFL's, pt with h/o LLE and reports of RLE wekaness per pt report.                           ADL either performed or assessed with clinical judgement   ADL Overall ADL's : At  baseline Eating/Feeding: Independent   Grooming: Wash/dry hands;Modified independent;Sitting   Upper Body Bathing: Modified independent;Sitting   Lower Body Bathing: Modified independent;Sit to/from stand;Sitting/lateral leans   Upper Body Dressing : Independent;Sitting   Lower Body Dressing: Modified independent;Sitting/lateral leans;Sit to/from stand Lower Body Dressing Details (indicate cue type and reason): Pt donned/doffed socks, slippers. Toilet Transfer: Supervision/safety;Ambulation;Regular Glass blower/designer Details (indicate cue type and reason): Increased time, moves slowly. No hands on assist Toileting- Clothing Manipulation and Hygiene: Modified independent;Sitting/lateral lean;Sit to/from stand   Tub/ Shower Transfer: Walk-in shower;Supervision/safety;Min guard;Ambulation Tub/Shower Transfer Details (indicate cue type and reason): Pt performed shower transfer w/ supervision-Min guard Assist. Significant other was educated to assist PRN at d/c and verbalized this after instruction today. Pt significant other indicated that this was near or at baseline level. Functional mobility during ADLs: Modified independent General ADL Comments: Pt reports generalized weakness and that she was going to out-pt PT for RLE knee weakness (note chart review indicates h/o LLE weakness). Pt appears at or close to overall PLOF per pt and significant other reports. OT recommends supervision-min guard assist for shower transfers at d/c for safety and pt significant other verbalizes understanding of this. Recommend PT assessment for possible AD needs. No further acute OT needs at this time, will sign off.     Vision Patient Visual Report: No change from baseline  Vision Assessment?: No apparent visual deficits     Perception     Praxis      Pertinent Vitals/Pain Pain Assessment: No/denies pain     Hand Dominance Right   Extremity/Trunk Assessment Upper Extremity Assessment Upper  Extremity Assessment: Overall WFL for tasks assessed;Generalized weakness;LUE deficits/detail LUE Deficits / Details: LUE generalized weakness from previous admission per pt and boyfriend reports (LUE grossly 4/5 vs RUE 5/5) LUE: (A/ROM WNL's) LUE Sensation: WNL LUE Coordination: WNL   Lower Extremity Assessment Lower Extremity Assessment: Defer to PT evaluation   Cervical / Trunk Assessment Cervical / Trunk Assessment: Normal   Communication Communication Communication: Other (comment)(Pt speech was clear & at normal rate and other times was more or less slurred. MD is aware of these fluctuations.)   Cognition Arousal/Alertness: Awake/alert Behavior During Therapy: WFL for tasks assessed/performed Overall Cognitive Status: Within Functional Limits for tasks assessed                                 General Comments: Pt significant other/boyfriend present throughout session   General Comments  Pt appears at or near baseline level at this time with LUE weakness, requires increased time for tasks.    Exercises     Shoulder Instructions      Home Living Family/patient expects to be discharged to:: Private residence Living Arrangements: Spouse/significant other Available Help at Discharge: Family;Available PRN/intermittently Type of Home: House Home Access: Stairs to enter [employer] of Steps: 3 Entrance Stairs-Rails: Right;Left;Can reach both Home Layout: One level     Bathroom Shower/Tub: Walk-in shower;Tub/shower unit   Bathroom Toilet: Standard     Home Equipment: None          Prior Functioning/Environment Level of Independence: Independent                 OT Problem List:        OT Treatment/Interventions:      OT Goals(Current goals can be found in the care plan section) Acute Rehab OT Goals Patient Stated Goal: Go home later today, continue with out patient PT for LE strengthening OT Goal Formulation: All assessment and  education complete, DC therapy Time For Goal Achievement: 05/14/18  OT Frequency:     Barriers to D/C:            Co-evaluation              AM-PAC PT "6 Clicks" Daily Activity     Outcome Measure Help from another person eating meals?: None Help from another person taking care of personal grooming?: None Help from another person toileting, which includes using toliet, bedpan, or urinal?: None Help from another person bathing (including washing, rinsing, drying)?: A Little Help from another person to put on and taking off regular upper body clothing?: None Help from another person to put on and taking off regular lower body clothing?: None 6 Click Score: 23   End of Session Equipment Utilized During Treatment: Gait belt  Activity Tolerance: Patient tolerated treatment well Patient left: in bed;with family/visitor present                   Time: 1610-9604 OT Time Calculation (min): 14 min Charges:  OT General Charges $OT Visit: 1 Visit OT Evaluation $OT Eval Moderate Complexity: 1 Mod   ,  Beth Dixon, OTR/L 05/14/2018, 10:07 AM

## 2018-05-14 NOTE — Evaluation (Signed)
Physical Therapy Evaluation Patient Details Name: Mackenzie Patterson MRN: [id number] DOB: 1970-12-24 Today's Date: 05/14/2018   History of Present Illness  Pt is a 47 y/o female admitted w/ Dx: TIA with right facial droop and L sided weakness & slurred speech. 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 PMH includes HTN, DM, pulmonary HTN, VSD, Asthma.   Clinical Impression  Pt admitted with above diagnosis. Pt currently with functional limitations due to the deficits listed below (see PT Problem List). At the time of PT eval pt was able to perform transfers and ambulation with modified independence up to min assist for balance support and safety. Deficits varied throughout session and were overall inconsistent. See below for details. Pt states she would like to continue outpatient PT, even though she doesn't think it is helping. Agree with continued outpatient PT to improve overall functional mobility and safety. Acutely, pt will benefit from skilled PT to increase their independence and safety with mobility to allow discharge to the venue listed below.       Follow Up Recommendations Outpatient PT;Supervision for mobility/OOB    Equipment Recommendations  Rolling walker with 5" wheels    Recommendations for Other Services       Precautions / Restrictions Precautions Precautions: Fall Precaution Comments: Very shaky when up on her feet Restrictions Weight Bearing Restrictions: No      Mobility  Bed Mobility Overal bed mobility: Independent                Transfers Overall transfer level: Modified independent               General transfer comment: Increased time, no physical assist for sit to stand.   Ambulation/Gait Ambulation/Gait assistance: Min guard;Min assist Gait Distance (Feet): 150 Feet Assistive device: Rolling walker (2 wheeled);1 person hand held assist Gait Pattern/deviations: Step-through pattern;Decreased stride  length;Narrow base of support;Antalgic Gait velocity: Decreased Gait velocity interpretation: <1.31 ft/sec, indicative of household ambulator General Gait Details: Pt appears to demonstrate knee buckling bilaterally. Slow and guarded. With RW, initially appears to improve however when therapist mentions the improvement, pt began deviating more dramatically.   Stairs            Wheelchair Mobility    Modified Rankin (Stroke Patients Only) Modified Rankin (Stroke Patients Only) Pre-Morbid Rankin Score: Moderate disability Modified Rankin: Moderately severe disability     Balance Overall balance assessment: Mild deficits observed, not formally tested Sitting-balance support: No upper extremity supported;Feet supported Sitting balance-Leahy Scale: Normal     Standing balance support: No upper extremity supported;During functional activity Standing balance-Leahy Scale: Poor Standing balance comment: Reliant on UE support during dynamic activity                             Pertinent Vitals/Pain Pain Assessment: No/denies pain    Home Living Family/patient expects to be discharged to:: Private residence Living Arrangements: Spouse/significant other Available Help at Discharge: Family;Available PRN/intermittently Type of Home: House Home Access: Stairs to enter Entrance Stairs-Rails: Right;Left;Can reach both Entrance Stairs-Number of Steps: 3 Home Layout: One level Home Equipment: None      Prior Function Level of Independence: Independent         Comments: Pt reports she has been going to outpatient PT for RLE weakness as well.      Hand Dominance   Dominant Hand: Right    Extremity/Trunk Assessment   Upper Extremity  Assessment LUE Deficits / Details: Pt earlier reported to OT that LUE weakness came on during a prior admission however during PT session states it is new. During MMT today pt inconsistent with deficits and did not appear to be giving  her full effort. LUE Sensation: WNL LUE Coordination: decreased gross motor    Lower Extremity Assessment Lower Extremity Assessment: RLE deficits/detail;LLE deficits/detail RLE Deficits / Details: Pt appeared to have RLE deficits when ambulating but not with MMT. R knee buckling and shaky with weight bearing. RLE Coordination: WNL LLE Deficits / Details: Inconsistent during MMT and did not appear to be giving full effort.  LLE Coordination: decreased gross motor    Cervical / Trunk Assessment Cervical / Trunk Assessment: Normal  Communication   Communication: Other (comment)(Pt speech was clear & at normal rate and other times was more or less slurred. MD is aware of these fluctuations.)  Cognition Arousal/Alertness: Awake/alert Behavior During Therapy: Flat affect Overall Cognitive Status: Within Functional Limits for tasks assessed                                 General Comments: Pt significant other/boyfriend present throughout session      General Comments      Exercises     Assessment/Plan    PT Assessment Patient needs continued PT services  PT Problem List Decreased strength;Decreased range of motion;Decreased activity tolerance;Decreased balance;Decreased mobility;Decreased knowledge of use of DME;Decreased safety awareness;Decreased knowledge of precautions;Pain       PT Treatment Interventions DME instruction;Gait training;Stair training;Functional mobility training;Therapeutic activities;Therapeutic exercise;Neuromuscular re-education;Patient/family education    PT Goals (Current goals can be found in the Care Plan section)  Acute Rehab PT Goals Patient Stated Goal: Go home later today, continue with outpatient PT for LE strengthening PT Goal Formulation: With patient/family Time For Goal Achievement: 05/21/18 Potential to Achieve Goals: Good    Frequency Min 3X/week   Barriers to discharge        Co-evaluation                AM-PAC PT "6 Clicks" Daily Activity  Outcome Measure Difficulty turning over in bed (including adjusting bedclothes, sheets and blankets)?: None Difficulty moving from lying on back to sitting on the side of the bed? : None Difficulty sitting down on and standing up from a chair with arms (e.g., wheelchair, bedside commode, etc,.)?: A Little Help needed moving to and from a bed to chair (including a wheelchair)?: A Little Help needed walking in hospital room?: A Little Help needed climbing 3-5 steps with a railing? : A Little 6 Click Score: 20    End of Session Equipment Utilized During Treatment: Gait belt Activity Tolerance: Patient tolerated treatment well Patient left: in bed;with call bell/phone within reach;with family/visitor present Nurse Communication: Mobility status PT Visit Diagnosis: Unsteadiness on feet (R26.81);Other symptoms and signs involving the nervous system (R29.898)    Time: 1400-1430 PT Time Calculation (min) (ACUTE ONLY): 30 min   Charges:   PT Evaluation $PT Eval Moderate Complexity: 1 Mod PT Treatments $Gait Training: 8-22 mins        Rolinda Roan, PT, DPT Acute Rehabilitation Services Pager: [phone] Office: [phone]   Thelma Comp 05/14/2018, 3:27 PM

## 2018-05-14 NOTE — Progress Notes (Signed)
EEG completed, results pending. 

## 2018-05-14 NOTE — Discharge Summary (Signed)
Physician Discharge Summary  Mackenzie Patterson NFA:[id number] DOB: 1970/09/14 DOA: 05/13/2018  PCP: Loletta Specter, PA-C  Admit date: 05/13/2018 Discharge date: 05/14/2018  Admitted From: Home Disposition:   Home  Recommendations for Outpatient Follow-up:  1. Follow up with PCP in 1-2 weeks 2. Please obtain BMP/CBC in one week your next doctors visit.  3. Aspirin and Plavix for 3 weeks followed by aspirin alone.  Follow-up outpatient with neurology in 3-4 weeks  Home Health: Resume outpatient PT Equipment/Devices: None for Discharge Condition: Stable CODE STATUS: Full Diet recommendation: Diabetic  Brief/Interim Summary:  47 year old with a history of VSD followed at [facility], pulmonary hypertension, pulmonary arterial hypertension, diabetes mellitus type 2, essential hypertension came to the hospital with complains of slurred speech, right-sided facial droop and left upper extremity weakness.  Apparently she has been having similar symptoms and had them about 8 months ago as well.  Neurology was called and patient was admitted to the hospital for further medical management.  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  Hemoglobin A1c was 6.1, LDL was 92.  EEG performed which was negative for any acute seizure-like activities.  Neurology recommended 3 weeks of aspirin and Plavix followed by aspirin alone.  It appears patient was also diagnosed recently outpatient with bacterial vaginosis and Candida which is been treated by her outpatient GYN.  I will defer this to their service.  Patient has reached maximum benefit from an hospital stay and stable for discharge.  Discharge Diagnoses:  Principal Problem:   TIA (transient ischemic attack) Active Problems:   Essential hypertension   Diabetes mellitus (HCC)  Slurred speech, improved - Unsure if this is TIA or stroke related.  Very inconsistent when she speaks.  Concern this is other psychological or her being little  drowsy and fatigue.  MRI of the brain is negative.  Echocardiogram showed ejection fraction 55 to 60%.  EEG does not show any seizure-like activity.  LDL is 92, hemoglobin A1c 6.1. -At this point would recommend aspirin and Plavix for 3 weeks followed by aspirin alone.  Appreciate neurology recommendations  Essential hypertension -Resume home blood pressure medications.  Hyperlipidemia -Continue Lipitor daily.  Diabetes mellitus type 2, controlled -Hemoglobin A1c 6.1  Morbid obesity with BMI greater than 30 -Recommend diet, weight loss and exercise.  History of VSD with pulmonary hypertension -Follows at [facility].  Outpatient diagnosed with bacterial vaginosis and Candida vaginitis -Plan to treat her with Flagyl for 7 days followed by Diflucan per her GYN.  Discharged today in stable condition Follow-up outpatient with neurology. Family was at bedside.  Discharge Instructions   Allergies as of 05/14/2018      Reactions   Penicillins Anaphylaxis   Has patient had a PCN reaction causing immediate rash, facial/tongue/throat swelling, SOB or lightheadedness with hypotension: Yes Has patient had a PCN reaction causing severe rash involving mucus membranes or skin necrosis: Yes Has patient had a PCN reaction that required hospitalization Yes- went to ED, was not admitted Has patient had a PCN reaction occurring within the last 10 years: No- longer then 10 years If all of the above answers are "NO", then may proceed with Cephalosporin use.   Tape Rash   Other reaction(s): Other (See Comments) Burns skin   Other Rash   Other reaction(s): Other (See Comments) **EKG GEL PADS**  "Burns my skin" Pt states she is allergic to a blood pressure medication, unsure.  **EKG GEL PADS**  "Burns my skin"   Latex  Rash      Medication List    TAKE these medications   ACCU-CHEK AVIVA PLUS w/Device Kit 1 kit by Does not apply route daily.   ACCU-CHEK FASTCLIX LANCETS Misc USE 2  TIMES DAILY AS DIRECTED   albuterol 108 (90 Base) MCG/ACT inhaler Commonly known as:  PROVENTIL HFA;VENTOLIN HFA Inhale 2 puffs into the lungs every 6 (six) hours as needed for wheezing or shortness of breath.   amLODipine 5 MG tablet Commonly known as:  NORVASC Take 1 tablet (5 mg total) by mouth daily.   aspirin EC 81 MG tablet Take 1 tablet (81 mg total) by mouth daily.   atorvastatin 20 MG tablet Commonly known as:  LIPITOR Take 1 tablet (20 mg total) by mouth daily.   budesonide-formoterol 160-4.5 MCG/ACT inhaler Commonly known as:  SYMBICORT Inhale 2 puffs into the lungs 2 (two) times daily.   carvedilol 6.25 MG tablet Commonly known as:  COREG Take 6.25 mg by mouth 2 (two) times daily with a meal.   clopidogrel 75 MG tablet Commonly known as:  PLAVIX Take 1 tablet (75 mg total) by mouth daily for 18 days. Start taking on:  05/15/2018   ferrous sulfate 325 (65 FE) MG EC tablet Take 1 tablet (325 mg total) by mouth 3 (three) times daily with meals.   fluconazole 150 MG tablet Commonly known as:  DIFLUCAN Take 1 tablet (150 mg total) by mouth once for 1 dose.   glucose blood test strip USE 2 TIMES DAILY AS DIRECFTED   lisinopril 20 MG tablet Commonly known as:  PRINIVIL,ZESTRIL Take 1 tablet (20 mg total) by mouth daily.   metFORMIN 500 MG 24 hr tablet Commonly known as:  GLUCOPHAGE-XR Take 1 tablet (500 mg total) by mouth daily with breakfast.   metroNIDAZOLE 500 MG tablet Commonly known as:  FLAGYL Take 1 tablet (500 mg total) by mouth 2 (two) times daily.      Follow-up Information    Loletta Specter, PA-C. Schedule an appointment as soon as possible for a visit in 1 week(s).   Specialty:  Physician Assistant Contact information: [address] [place] [address] [phone]        [facility]. Schedule an appointment as soon as possible for a visit in 3 week(s).   Contact information: [address]     [address] [place] [address] [phone]         Allergies  Allergen Reactions  . Penicillins Anaphylaxis    Has patient had a PCN reaction causing immediate rash, facial/tongue/throat swelling, SOB or lightheadedness with hypotension: Yes Has patient had a PCN reaction causing severe rash involving mucus membranes or skin necrosis: Yes Has patient had a PCN reaction that required hospitalization Yes- went to ED, was not admitted Has patient had a PCN reaction occurring within the last 10 years: No- longer then 10 years If all of the above answers are "NO", then may proceed with Cephalosporin use.   . Tape Rash    Other reaction(s): Other (See Comments) Burns skin  . Other Rash    Other reaction(s): Other (See Comments) **EKG GEL PADS**  "Burns my skin" Pt states she is allergic to a blood pressure medication, unsure.  **EKG GEL PADS**  "Burns my skin"  . Latex Rash    You were cared for by a hospitalist during your hospital stay. If you have any questions about your discharge medications or the care you received while you  were in the hospital after you are discharged, you can call the unit and asked to speak with the hospitalist on call if the hospitalist that took care of you is not available. Once you are discharged, your primary care physician will handle any further medical issues. Please note that no refills for any discharge medications will be authorized once you are discharged, as it is imperative that you return to your primary care physician (or establish a relationship with a primary care physician if you do not have one) for your aftercare needs so that they can reassess your need for medications and monitor your lab values.  Consultations:  Neurology   Procedures/Studies: Ct Angio Head W Or Wo Contrast  Result Date: 05/13/2018 CLINICAL DATA:  47 year old female with crossed neurologic symptoms; ipsilateral face and contralateral body deficits. EXAM:  CT ANGIOGRAPHY HEAD AND NECK TECHNIQUE: Multidetector CT imaging of the head and neck was performed using the standard protocol during bolus administration of intravenous contrast. Multiplanar CT image reconstructions and MIPs were obtained to evaluate the vascular anatomy. Carotid stenosis measurements (when applicable) are obtained utilizing NASCET criteria, using the distal internal carotid diameter as the denominator. CONTRAST:  75mL ISOVUE-370 IOPAMIDOL (ISOVUE-370) INJECTION 76% COMPARISON:  Head CT without contrast 2034 hours today. Brain MRI, MRA head and neck 10/24/2017. FINDINGS: CTA NECK Skeleton: No acute osseous abnormality identified. Lower cervical disc and endplate degeneration. Upper chest: Negative. Other neck: Negative. Aortic arch: 3 vessel arch configuration.  No arch atherosclerosis. Right carotid system: Possible mild soft plaque at the right ICA bulb but no stenosis. Left carotid system: Negative. Vertebral arteries: No proximal right subclavian or right vertebral artery origin plaque or stenosis. The right vertebral artery is patent to the skull base without stenosis. No proximal left subclavian artery or left vertebral artery origin plaque or stenosis. The left vertebral artery is non dominant and patent to the skull base without stenosis. CTA HEAD Posterior circulation: Both distal vertebral arteries are patent. The left V4 segment distal to PICA appears mildly irregular, but the MRA earlier this year suggests the vessel merely tapers beyond the PICA. No distal right vertebral stenosis. Patent but diminutive basilar artery. AICA origins remain patent. No focal basilar stenosis. SCA and left PCA origins are stable and within normal limits. Fetal type right PCA. Left Posterior communicating artery is diminutive or absent. Bilateral PCA branches are within normal limits. Anterior circulation: Both ICA siphons are patent, the right is mildly dominant owing to the right PCA and the ACA anatomy.  No siphon plaque or stenosis. Ophthalmic and right posterior communicating artery origins are normal. Patent carotid termini. The right A1 is dominant and the left is diminutive or absent. Anterior communicating artery and bilateral ACA branches are within normal limits. Left MCA M1 segment and left MCA bifurcation are patent without stenosis. The left MCA branches may be mildly irregular but no branch occlusion is identified. The right M1 and right MCA bifurcation are patent without stenosis. Distal right MCA branches appear mildly irregular but no branch occlusion is identified. Venous sinuses: Patent. Anatomic variants: Dominant right A1. Dominant right vertebral artery. Fetal right PCA origin. Review of the MIP images confirms the above findings IMPRESSION: 1. Negative for large vessel occlusion. This was discussed by telephone with Dr. Ritta Slot on 05/13/2018 at 2056 hours. 2. Minimal if any large vessel atherosclerosis in the head and neck. Questionable intracranial atherosclerosis affecting bilateral distal branches, but no proximal intracranial stenosis identified. Electronically Signed   By: Rexene Edison  Margo Aye M.D.   On: 05/13/2018 21:18   Dg Chest 2 View  Result Date: 05/14/2018 CLINICAL DATA:  TIA yesterday. History of hypertension and diabetes. EXAM: CHEST - 2 VIEW COMPARISON:  03/31/2016 FINDINGS: Cardiac enlargement. No vascular congestion, edema, or consolidation. No blunting of costophrenic angles. No pneumothorax. Mediastinal contours appear intact. No change since previous study. IMPRESSION: Cardiac enlargement. No evidence of active pulmonary disease. Electronically Signed   By: Burman Nieves M.D.   On: 05/14/2018 02:40   Ct Angio Neck W Or Wo Contrast  Result Date: 05/13/2018 CLINICAL DATA:  47 year old female with crossed neurologic symptoms; ipsilateral face and contralateral body deficits. EXAM: CT ANGIOGRAPHY HEAD AND NECK TECHNIQUE: Multidetector CT imaging of the head and neck  was performed using the standard protocol during bolus administration of intravenous contrast. Multiplanar CT image reconstructions and MIPs were obtained to evaluate the vascular anatomy. Carotid stenosis measurements (when applicable) are obtained utilizing NASCET criteria, using the distal internal carotid diameter as the denominator. CONTRAST:  75mL ISOVUE-370 IOPAMIDOL (ISOVUE-370) INJECTION 76% COMPARISON:  Head CT without contrast 2034 hours today. Brain MRI, MRA head and neck 10/24/2017. FINDINGS: CTA NECK Skeleton: No acute osseous abnormality identified. Lower cervical disc and endplate degeneration. Upper chest: Negative. Other neck: Negative. Aortic arch: 3 vessel arch configuration.  No arch atherosclerosis. Right carotid system: Possible mild soft plaque at the right ICA bulb but no stenosis. Left carotid system: Negative. Vertebral arteries: No proximal right subclavian or right vertebral artery origin plaque or stenosis. The right vertebral artery is patent to the skull base without stenosis. No proximal left subclavian artery or left vertebral artery origin plaque or stenosis. The left vertebral artery is non dominant and patent to the skull base without stenosis. CTA HEAD Posterior circulation: Both distal vertebral arteries are patent. The left V4 segment distal to PICA appears mildly irregular, but the MRA earlier this year suggests the vessel merely tapers beyond the PICA. No distal right vertebral stenosis. Patent but diminutive basilar artery. AICA origins remain patent. No focal basilar stenosis. SCA and left PCA origins are stable and within normal limits. Fetal type right PCA. Left Posterior communicating artery is diminutive or absent. Bilateral PCA branches are within normal limits. Anterior circulation: Both ICA siphons are patent, the right is mildly dominant owing to the right PCA and the ACA anatomy. No siphon plaque or stenosis. Ophthalmic and right posterior communicating artery  origins are normal. Patent carotid termini. The right A1 is dominant and the left is diminutive or absent. Anterior communicating artery and bilateral ACA branches are within normal limits. Left MCA M1 segment and left MCA bifurcation are patent without stenosis. The left MCA branches may be mildly irregular but no branch occlusion is identified. The right M1 and right MCA bifurcation are patent without stenosis. Distal right MCA branches appear mildly irregular but no branch occlusion is identified. Venous sinuses: Patent. Anatomic variants: Dominant right A1. Dominant right vertebral artery. Fetal right PCA origin. Review of the MIP images confirms the above findings IMPRESSION: 1. Negative for large vessel occlusion. This was discussed by telephone with Dr. Ritta Slot on 05/13/2018 at 2056 hours. 2. Minimal if any large vessel atherosclerosis in the head and neck. Questionable intracranial atherosclerosis affecting bilateral distal branches, but no proximal intracranial stenosis identified. Electronically Signed   By: Odessa Fleming M.D.   On: 05/13/2018 21:18   Mr Brain Wo Contrast  Result Date: 05/14/2018 CLINICAL DATA:  Slurred speech and right facial droop. Left-sided arm weakness.  EXAM: MRI HEAD WITHOUT CONTRAST TECHNIQUE: Multiplanar, multiecho pulse sequences of the brain and surrounding structures were obtained without intravenous contrast. COMPARISON:  CTA head neck 05/13/2018 FINDINGS: BRAIN: There is no acute infarct, acute hemorrhage, hydrocephalus or extra-axial collection. The midline structures are normal. No midline shift or other mass effect. There is an old left cerebellar infarct. The white matter signal is normal for the patient's age. The cerebral and cerebellar volume are age-appropriate. Susceptibility-sensitive sequences show no chronic microhemorrhage or superficial siderosis. VASCULAR: Major intracranial arterial and venous sinus flow voids are normal. SKULL AND UPPER CERVICAL  SPINE: Calvarial bone marrow signal is normal. There is no skull base mass. Visualized upper cervical spine and soft tissues are normal. SINUSES/ORBITS: No fluid levels or advanced mucosal thickening. No mastoid or middle ear effusion. The orbits are normal. IMPRESSION: Old left cerebellar infarct.  Otherwise normal MRI of the brain. Electronically Signed   By: Deatra Robinson M.D.   On: 05/14/2018 02:41   Ct Head Code Stroke Wo Contrast  Result Date: 05/13/2018 CLINICAL DATA:  Code stroke.  47 year old female with facial droop. EXAM: CT HEAD WITHOUT CONTRAST TECHNIQUE: Contiguous axial images were obtained from the base of the skull through the vertex without intravenous contrast. COMPARISON:  Head CT and brain MRI with head and neck MRA 10/24/2017. FINDINGS: Brain: Stable cerebral volume. Mild dystrophic calcifications in both globus pallidus. There is a small chronic infarct in the left superior cerebellum on series 3, image 10. Otherwise stable and negative gray-white matter differentiation throughout the brain. No midline shift, ventriculomegaly, mass effect, evidence of mass lesion, intracranial hemorrhage or evidence of cortically based acute infarction. Vascular: No suspicious intracranial vascular hyperdensity. Skull: Negative. Sinuses/Orbits: Visualized paranasal sinuses and mastoids are stable and well pneumatized. Other: Visualized orbits and scalp soft tissues are within normal limits. ASPECTS (Alberta Stroke Program Early CT Score) - Ganglionic level infarction (caudate, lentiform nuclei, internal capsule, insula, M1-M3 cortex): 7 - Supraganglionic infarction (M4-M6 cortex): 3 Total score (0-10 with 10 being normal): 10 IMPRESSION: 1. Stable noncontrast CT appearance of the brain since April, negative aside from a small chronic left cerebellar infarct. 2. ASPECTS is 10. 3. These results were communicated to Dr. Amada Jupiter at 8:42 pmon 11/17/2019by text page via the [facility] messaging system.  Electronically Signed   By: Odessa Fleming M.D.   On: 05/13/2018 20:43      Subjective: Feels better, no complaints.  Slightly started speech but mostly inconsistent unable to carry on a full conversation.  General = no fevers, chills, dizziness, malaise, fatigue HEENT/EYES = negative for pain, redness, loss of vision, double vision, blurred vision, loss of hearing, sore throat, hoarseness, dysphagia Cardiovascular= negative for chest pain, palpitation, murmurs, lower extremity swelling Respiratory/lungs= negative for shortness of breath, cough, hemoptysis, wheezing, mucus production Gastrointestinal= negative for nausea, vomiting,, abdominal pain, melena, hematemesis Genitourinary= negative for Dysuria, Hematuria, Change in Urinary Frequency MSK = Negative for arthralgia, myalgias, Back Pain, Joint swelling  Neurology= Negative for headache, seizures, numbness, tingling  Psychiatry= Negative for anxiety, depression, suicidal and homocidal ideation Allergy/Immunology= Medication/Food allergy as listed  Skin= Negative for Rash, lesions, ulcers, itching    Discharge Exam: Vitals:   05/14/18 0740 05/14/18 0942  BP: (!) 143/82 (!) 142/91  Pulse: 90 80  Resp: 16 16  Temp: 98.7 F (37.1 C) 98 F (36.7 C)  SpO2: 98% 96%   Vitals:   05/14/18 0308 05/14/18 0600 05/14/18 0740 05/14/18 0942  BP: 131/76 138/79 (!) 143/82 (!) 142/91  Pulse: 86 85 90 80  Resp:  17 16 16   Temp:  98.6 F (37 C) 98.7 F (37.1 C) 98 F (36.7 C)  TempSrc:  Oral Oral Oral  SpO2: 97% 98% 98% 96%  Weight:      Height:        General: Pt is alert, awake, not in acute distress Cardiovascular: RRR, S1/S2 +, no rubs, no gallops Respiratory: CTA bilaterally, no wheezing, no rhonchi Abdominal: Soft, NT, ND, bowel sounds + Extremities: no edema, no cyanosis    The results of significant diagnostics from this hospitalization (including imaging, microbiology, ancillary and laboratory) are listed below for reference.      Microbiology: No results found for this or any previous visit (from the past 240 hour(s)).   Labs: BNP (last 3 results) No results for input(s): BNP in the last 8760 hours. Basic Metabolic Panel: Recent Labs  Lab 05/13/18 2033 05/13/18 2038  NA 131* 138  K 3.4* 3.6  CL 104 106  CO2 20*  --   GLUCOSE 148* 144*  BUN 12 13  CREATININE 0.78 0.80  CALCIUM 8.6*  --    Liver Function Tests: Recent Labs  Lab 05/13/18 2033  AST 31  ALT 23  ALKPHOS 67  BILITOT 0.7  PROT 7.4  ALBUMIN 4.0   No results for input(s): LIPASE, AMYLASE in the last 168 hours. No results for input(s): AMMONIA in the last 168 hours. CBC: Recent Labs  Lab 05/13/18 2033 05/13/18 2038  WBC 5.9  --   NEUTROABS 2.9  --   HGB 10.2* 12.2  HCT 36.3 36.0  MCV 75.3*  --   PLT 261  --    Cardiac Enzymes: No results for input(s): CKTOTAL, CKMB, CKMBINDEX, TROPONINI in the last 168 hours. BNP: Invalid input(s): POCBNP CBG: Recent Labs  Lab 05/13/18 2051 05/14/18 0759 05/14/18 1211  GLUCAP 131* 175* 131*   D-Dimer No results for input(s): DDIMER in the last 72 hours. Hgb A1c Recent Labs    05/14/18 0530  HGBA1C 6.1*   Lipid Profile Recent Labs    05/14/18 0530  CHOL 159  HDL 47  LDLCALC 92  TRIG 102  CHOLHDL 3.4   Thyroid function studies No results for input(s): TSH, T4TOTAL, T3FREE, THYROIDAB in the last 72 hours.  Invalid input(s): FREET3 Anemia work up No results for input(s): VITAMINB12, FOLATE, FERRITIN, TIBC, IRON, RETICCTPCT in the last 72 hours. Urinalysis    Component Value Date/Time   COLORURINE YELLOW 03/31/2016 1433   APPEARANCEUR CLOUDY (A) 03/31/2016 1433   LABSPEC 1.031 (H) 03/31/2016 1433   PHURINE 5.5 03/31/2016 1433   GLUCOSEU NEGATIVE 03/31/2016 1433   HGBUR NEGATIVE 03/31/2016 1433   BILIRUBINUR small 02/27/2018 0910   KETONESUR NEGATIVE 03/31/2016 1433   PROTEINUR Negative 02/27/2018 0910   PROTEINUR NEGATIVE 03/31/2016 1433   UROBILINOGEN 0.2  02/27/2018 0910   UROBILINOGEN 0.2 09/14/2014 1024   NITRITE negative 02/27/2018 0910   NITRITE NEGATIVE 03/31/2016 1433   LEUKOCYTESUR Negative 02/27/2018 0910   Sepsis Labs Invalid input(s): PROCALCITONIN,  WBC,  LACTICIDVEN Microbiology No results found for this or any previous visit (from the past 240 hour(s)).   Time coordinating discharge:  I have spent 35 minutes face to face with the patient and on the ward discussing the patients care, assessment, plan and disposition with other care givers. >50% of the time was devoted counseling the patient about the risks and benefits of treatment/Discharge disposition and coordinating care.   SIGNED:  Denys Salinger Joline Maxcy, MD  [facility] 05/14/2018, 3:17 PM Pager   If 7PM-7AM, please contact night-coverage [email or web address] Password [email or web address]

## 2018-05-14 NOTE — Progress Notes (Addendum)
STROKE TEAM PROGRESS NOTE   INTERVAL HISTORY Her boyfriend and ? mother is at the bedside.  She has been excited about Christmas, stayed up most of the night decorating. She also has recently had diarrhea. She walked into the bathroom to get tissue when she first noted the symptoms and called for her boyfriend. She denies any stress, no seizure - like activity, no HA. She does not work (did not work prior to her stroke)  Vitals:   05/14/18 0000 05/14/18 0308 05/14/18 0600 05/14/18 0740  BP: (!) 145/81 131/76 138/79 (!) 143/82  Pulse: 86 86 85 90  Resp: 17  17 16   Temp: 97.6 F (36.4 C)  98.6 F (37 C) 98.7 F (37.1 C)  TempSrc: Axillary  Oral Oral  SpO2: 99% 97% 98% 98%  Weight:      Height:        CBC:  Recent Labs  Lab 05/13/18 2033 05/13/18 2038  WBC 5.9  --   NEUTROABS 2.9  --   HGB 10.2* 12.2  HCT 36.3 36.0  MCV 75.3*  --   PLT 261  --     Basic Metabolic Panel:  Recent Labs  Lab 05/13/18 2033 05/13/18 2038  NA 131* 138  K 3.4* 3.6  CL 104 106  CO2 20*  --   GLUCOSE 148* 144*  BUN 12 13  CREATININE 0.78 0.80  CALCIUM 8.6*  --    Lipid Panel:     Component Value Date/Time   CHOL 159 05/14/2018 0530   CHOL 151 04/12/2018 0921   TRIG 102 05/14/2018 0530   HDL 47 05/14/2018 0530   HDL 43 04/12/2018 0921   CHOLHDL 3.4 05/14/2018 0530   VLDL 20 05/14/2018 0530   LDLCALC 92 05/14/2018 0530   LDLCALC 77 04/12/2018 0921   HgbA1c:  Lab Results  Component Value Date   HGBA1C 6.1 (H) 05/14/2018   Urine Drug Screen:     Component Value Date/Time   LABOPIA NONE DETECTED 05/14/2018 0812   COCAINSCRNUR NONE DETECTED 05/14/2018 0812   LABBENZ NONE DETECTED 05/14/2018 0812   AMPHETMU NONE DETECTED 05/14/2018 0812   THCU NONE DETECTED 05/14/2018 0812   LABBARB NONE DETECTED 05/14/2018 0812    Alcohol Level     Component Value Date/Time   ETH <11 01/05/2014 1252    IMAGING Ct Angio Head W Or Wo Contrast  Result Date: 05/13/2018 CLINICAL DATA:   47 year old female with crossed neurologic symptoms; ipsilateral face and contralateral body deficits. EXAM: CT ANGIOGRAPHY HEAD AND NECK TECHNIQUE: Multidetector CT imaging of the head and neck was performed using the standard protocol during bolus administration of intravenous contrast. Multiplanar CT image reconstructions and MIPs were obtained to evaluate the vascular anatomy. Carotid stenosis measurements (when applicable) are obtained utilizing NASCET criteria, using the distal internal carotid diameter as the denominator. CONTRAST:  21mL ISOVUE-370 IOPAMIDOL (ISOVUE-370) INJECTION 76% COMPARISON:  Head CT without contrast 2034 hours today. Brain MRI, MRA head and neck 10/24/2017. FINDINGS: CTA NECK Skeleton: No acute osseous abnormality identified. Lower cervical disc and endplate degeneration. Upper chest: Negative. Other neck: Negative. Aortic arch: 3 vessel arch configuration.  No arch atherosclerosis. Right carotid system: Possible mild soft plaque at the right ICA bulb but no stenosis. Left carotid system: Negative. Vertebral arteries: No proximal right subclavian or right vertebral artery origin plaque or stenosis. The right vertebral artery is patent to the skull base without stenosis. No proximal left subclavian artery or left vertebral artery origin plaque or  stenosis. The left vertebral artery is non dominant and patent to the skull base without stenosis. CTA HEAD Posterior circulation: Both distal vertebral arteries are patent. The left V4 segment distal to PICA appears mildly irregular, but the MRA earlier this year suggests the vessel merely tapers beyond the PICA. No distal right vertebral stenosis. Patent but diminutive basilar artery. AICA origins remain patent. No focal basilar stenosis. SCA and left PCA origins are stable and within normal limits. Fetal type right PCA. Left Posterior communicating artery is diminutive or absent. Bilateral PCA branches are within normal limits. Anterior  circulation: Both ICA siphons are patent, the right is mildly dominant owing to the right PCA and the ACA anatomy. No siphon plaque or stenosis. Ophthalmic and right posterior communicating artery origins are normal. Patent carotid termini. The right A1 is dominant and the left is diminutive or absent. Anterior communicating artery and bilateral ACA branches are within normal limits. Left MCA M1 segment and left MCA bifurcation are patent without stenosis. The left MCA branches may be mildly irregular but no branch occlusion is identified. The right M1 and right MCA bifurcation are patent without stenosis. Distal right MCA branches appear mildly irregular but no branch occlusion is identified. Venous sinuses: Patent. Anatomic variants: Dominant right A1. Dominant right vertebral artery. Fetal right PCA origin. Review of the MIP images confirms the above findings IMPRESSION: 1. Negative for large vessel occlusion. This was discussed by telephone with Dr. Roland Rack on 05/13/2018 at 2056 hours. 2. Minimal if any large vessel atherosclerosis in the head and neck. Questionable intracranial atherosclerosis affecting bilateral distal branches, but no proximal intracranial stenosis identified. Electronically Signed   By: Genevie Ann M.D.   On: 05/13/2018 21:18   Dg Chest 2 View  Result Date: 05/14/2018 CLINICAL DATA:  TIA yesterday. History of hypertension and diabetes. EXAM: CHEST - 2 VIEW COMPARISON:  03/31/2016 FINDINGS: Cardiac enlargement. No vascular congestion, edema, or consolidation. No blunting of costophrenic angles. No pneumothorax. Mediastinal contours appear intact. No change since previous study. IMPRESSION: Cardiac enlargement. No evidence of active pulmonary disease. Electronically Signed   By: Lucienne Capers M.D.   On: 05/14/2018 02:40   Ct Angio Neck W Or Wo Contrast  Result Date: 05/13/2018 CLINICAL DATA:  47 year old female with crossed neurologic symptoms; ipsilateral face and  contralateral body deficits. EXAM: CT ANGIOGRAPHY HEAD AND NECK TECHNIQUE: Multidetector CT imaging of the head and neck was performed using the standard protocol during bolus administration of intravenous contrast. Multiplanar CT image reconstructions and MIPs were obtained to evaluate the vascular anatomy. Carotid stenosis measurements (when applicable) are obtained utilizing NASCET criteria, using the distal internal carotid diameter as the denominator. CONTRAST:  79mL ISOVUE-370 IOPAMIDOL (ISOVUE-370) INJECTION 76% COMPARISON:  Head CT without contrast 2034 hours today. Brain MRI, MRA head and neck 10/24/2017. FINDINGS: CTA NECK Skeleton: No acute osseous abnormality identified. Lower cervical disc and endplate degeneration. Upper chest: Negative. Other neck: Negative. Aortic arch: 3 vessel arch configuration.  No arch atherosclerosis. Right carotid system: Possible mild soft plaque at the right ICA bulb but no stenosis. Left carotid system: Negative. Vertebral arteries: No proximal right subclavian or right vertebral artery origin plaque or stenosis. The right vertebral artery is patent to the skull base without stenosis. No proximal left subclavian artery or left vertebral artery origin plaque or stenosis. The left vertebral artery is non dominant and patent to the skull base without stenosis. CTA HEAD Posterior circulation: Both distal vertebral arteries are patent. The left V4  segment distal to PICA appears mildly irregular, but the MRA earlier this year suggests the vessel merely tapers beyond the PICA. No distal right vertebral stenosis. Patent but diminutive basilar artery. AICA origins remain patent. No focal basilar stenosis. SCA and left PCA origins are stable and within normal limits. Fetal type right PCA. Left Posterior communicating artery is diminutive or absent. Bilateral PCA branches are within normal limits. Anterior circulation: Both ICA siphons are patent, the right is mildly dominant owing to  the right PCA and the ACA anatomy. No siphon plaque or stenosis. Ophthalmic and right posterior communicating artery origins are normal. Patent carotid termini. The right A1 is dominant and the left is diminutive or absent. Anterior communicating artery and bilateral ACA branches are within normal limits. Left MCA M1 segment and left MCA bifurcation are patent without stenosis. The left MCA branches may be mildly irregular but no branch occlusion is identified. The right M1 and right MCA bifurcation are patent without stenosis. Distal right MCA branches appear mildly irregular but no branch occlusion is identified. Venous sinuses: Patent. Anatomic variants: Dominant right A1. Dominant right vertebral artery. Fetal right PCA origin. Review of the MIP images confirms the above findings IMPRESSION: 1. Negative for large vessel occlusion. This was discussed by telephone with Dr. Roland Rack on 05/13/2018 at 2056 hours. 2. Minimal if any large vessel atherosclerosis in the head and neck. Questionable intracranial atherosclerosis affecting bilateral distal branches, but no proximal intracranial stenosis identified. Electronically Signed   By: Genevie Ann M.D.   On: 05/13/2018 21:18   Mr Brain Wo Contrast  Result Date: 05/14/2018 CLINICAL DATA:  Slurred speech and right facial droop. Left-sided arm weakness. EXAM: MRI HEAD WITHOUT CONTRAST TECHNIQUE: Multiplanar, multiecho pulse sequences of the brain and surrounding structures were obtained without intravenous contrast. COMPARISON:  CTA head neck 05/13/2018 FINDINGS: BRAIN: There is no acute infarct, acute hemorrhage, hydrocephalus or extra-axial collection. The midline structures are normal. No midline shift or other mass effect. There is an old left cerebellar infarct. The white matter signal is normal for the patient's age. The cerebral and cerebellar volume are age-appropriate. Susceptibility-sensitive sequences show no chronic microhemorrhage or superficial  siderosis. VASCULAR: Major intracranial arterial and venous sinus flow voids are normal. SKULL AND UPPER CERVICAL SPINE: Calvarial bone marrow signal is normal. There is no skull base mass. Visualized upper cervical spine and soft tissues are normal. SINUSES/ORBITS: No fluid levels or advanced mucosal thickening. No mastoid or middle ear effusion. The orbits are normal. IMPRESSION: Old left cerebellar infarct.  Otherwise normal MRI of the brain. Electronically Signed   By: Ulyses Jarred M.D.   On: 05/14/2018 02:41   Ct Head Code Stroke Wo Contrast  Result Date: 05/13/2018 CLINICAL DATA:  Code stroke.  47 year old female with facial droop. EXAM: CT HEAD WITHOUT CONTRAST TECHNIQUE: Contiguous axial images were obtained from the base of the skull through the vertex without intravenous contrast. COMPARISON:  Head CT and brain MRI with head and neck MRA 10/24/2017. FINDINGS: Brain: Stable cerebral volume. Mild dystrophic calcifications in both globus pallidus. There is a small chronic infarct in the left superior cerebellum on series 3, image 10. Otherwise stable and negative gray-white matter differentiation throughout the brain. No midline shift, ventriculomegaly, mass effect, evidence of mass lesion, intracranial hemorrhage or evidence of cortically based acute infarction. Vascular: No suspicious intracranial vascular hyperdensity. Skull: Negative. Sinuses/Orbits: Visualized paranasal sinuses and mastoids are stable and well pneumatized. Other: Visualized orbits and scalp soft tissues are within normal  limits. ASPECTS ([place] Stroke Program Early CT Score) - Ganglionic level infarction (caudate, lentiform nuclei, internal capsule, insula, M1-M3 cortex): 7 - Supraganglionic infarction (M4-M6 cortex): 3 Total score (0-10 with 10 being normal): 10 IMPRESSION: 1. Stable noncontrast CT appearance of the brain since April, negative aside from a small chronic left cerebellar infarct. 2. ASPECTS is 10. 3. These results  were communicated to Dr. Leonel Ramsay at 8:42 pmon 11/17/2019by text page via the [facility] messaging system. Electronically Signed   By: Genevie Ann M.D.   On: 05/13/2018 20:43   2D Echocardiogram  - Left ventricle: The cavity size was normal. Wall thickness was increased in a pattern of moderate LVH. Systolic function was normal. The estimated ejection fraction was in the range of 55% to 60%. Wall motion was normal; there were no regional wall motion abnormalities. Doppler parameters are consistent with abnormal left ventricular relaxation (grade 1 diastolic dysfunction). The E/e&' ratio is between 8-15, suggesting indeterminate LV filling pressure. - Ventricular septum: Restrictive perimembranous VSD noted. - Mitral valve: Mildly thickened leaflets . There was trivial regurgitation. - Left atrium: The atrium was normal in size. - Right ventricle: The cavity size was normal. Wall thickness was normal. The moderator band was prominent. Systolic function was normal. - Tricuspid valve: There was moderate regurgitation. - Pulmonary arteries: PA peak pressure: 53 mm Hg (S). - Inferior vena cava: The vessel was normal in size. The respirophasic diameter changes were in the normal range (>= 50%), consistent with normal central venous pressure. - Pericardium, extracardiac: Features were not consistent with tamponade physiology. Impressions:  Compared to a prior study in 10/2017, there are no significant changes.  EEG This EEG is characterized by slowing which is consistent with normal drowse and sleep.  Can not rule out the possibility of slowing related to general cerebral disturbance such as a metabolic encephalopathy.  Clinical correlation recommended.  No epileptiform activity is noted.   PHYSICAL EXAM Constitutional: Appears well-developed and well-nourished.  Psych: Affect appropriate to situation Eyes: No scleral injection HENT: No OP obstrucion Head: Normocephalic.  Respiratory: Effort normal,  non-labored breathing Skin: WDI  Neuro: Mental Status: Patient is awake, alert, oriented to person, place, month, year, and situation. Patient is able to give a clear and coherent history. No signs of aphasia or neglect. Speech sounds forced and deliberate, paucity at times. No volunteered language. Cranial Nerves: II: Visual Fields are full. Pupils are equal, round, and reactive to light.   III,IV, VI: EOMI without ptosis or diploplia.  V: Facial sensation is symmetric to temperature VII: Facial movement with decreased nasolabial fold on the right as well as right facial weakness VIII: hearing is intact to voice X: Uvula elevates symmetrically XI: Shoulder shrug is symmetric. XII: tongue is midline without atrophy or fasciculations.  Motor: Tone is normal. Bulk is normal. 5/5 strength was present on the right side and 5/5 on the left. Sensory: Sensation is symmetric to light touch and temperature in the arms and legs. Cerebellar: No clear ataxia  ASSESSMENT/PLAN Ms. Chinenye Katzenberger is a 47 y.o. female with history of previous stroke, VSD, HTN, DM, mild PAH presenting with rapidly improving L sided weakness and facial droop/slurred speech.   Possible TIA  Code Stroke CT head No acute stroke. Small old L cerebellar infarct.  ASPECTS 10.     CTA head & neck no LVO. Minimal if any large vessel atherosclerosis  MRI  No acute stroke  2D Echo  EF 55-60%. No source of embolus  No  changes since May.  EEG no seizure activity  LDL 92  HgbA1c 6.1  Lovenox 40 mg sq daily for VTE prophylaxis  aspirin 81 mg daily prior to admission, now on aspirin 81 mg daily and clopidogrel 75 mg daily following aspirin and plavix load. Will continue DAPT x 3 weeks then aspirin alone.  Therapy recommendations:  No OT  Disposition:  pending   Hypertension  Stable . BP goal normotensive  Hyperlipidemia  Home meds:  lipitor 20, resumed in hospital  LDL 92, goal < 70  Continue statin at  discharge  Diabetes type II  HgbA1c 6.1, at goal < 7.0  Other Stroke Risk Factors  Obesity, Body mass index is 34.02 kg/m., recommend weight loss, diet and exercise as appropriate   Hx possible TIA  10/2017 transient episode of left upper is a weakness likely due to right brain TIA.   Other Active Problems  Pulmonary arterial HTN  Congenital vetricular septal defect (VSD)  Hospital day # 0  Burnetta Sabin, MSN, APRN, ANVP-BC, AGPCNP-BC Advanced Practice Stroke Nurse [place] for Schedule & Pager information 05/14/2018 10:46 AM   ATTENDING NOTE: I reviewed above note and agree with the assessment and plan. Pt was seen and examined.   47 year old female with history of VSD, DM, HTN, obesity admitted for transient left-sided weakness facial droop and slurred speech.  She had similar episode in 10/2017 with left-sided weakness for 45 minutes and resolved.  Concerning for right brain TIA, MRI, MRA head and neck all negative.  She was put on aspirin Plavix for 3 weeks and then aspirin alone as well as statin medication.  On this admission, patient had a similar symptoms with left-sided weakness left facial droop and slurred speech for 45 minutes and resolved.  CT head and neck negative.  MRI showed no acute stroke but interval developing small left cerebellum infarct.  EEG negative for seizure.  EF 55 to 60%.  LDL 92 and A1c 6.1.  UDS negative.  Patient has extensive cardiac history in the past.  She has been following with Dr. Leonides Schanz in [facility] for VSD and pulmonary hypertension since 2014.  She had 2D echo and cardiac MRI in the past but confirmed a VSD, but no PFO or ASD mentioned in the test.  She also had frequent heart palpitation and fluttering with shortness of breath.  Had 30-day cardio event monitoring in the past with Dr. Leonides Schanz, seems no clear arrhythmia found.  She had next appointment with Dr. Leonides Schanz in 09/2018.  Although there is possibility that  her symptoms are related to her lack of sleep and exertion for the last 2 days, however given the recurrent episode of left-sided weakness lasting 45 minutes, and interval development of left cerebellum small infarct, as well as cardiac history with heart palpitation, concerning for cardiac source of TIA.  Will ask her to follow-up with Dr. Leonie Man in stroke clinic at [facility] to consider loop recorder placement.  Recommend to continue aspirin and Plavix DAPT for 3 weeks and then Plavix alone.  Continue Lipitor 20 for stroke prevention.  We will set up appointment with Dr. Leonie Man ASAP.   Neurology will sign off. Please call with questions. Thanks for the consult.  Rosalin Hawking, MD PhD Stroke Neurology 05/14/2018 4:51 PM  I spent  35 minutes in total face-to-face time with the patient, more than 50% of which was spent in counseling and coordination of care, reviewing test results, images and medication, and  discussing the diagnosis of TIA, hx of TIA, extensive hx of VSD and heart palpitation, treatment plan and potential prognosis. This patient's care requiresreview of multiple databases, neurological assessment, discussion with family, other specialists and medical decision making of high complexity. I have reviewed extensively her previous cardiac charts and had long discussion with pt regarding further monitoring.         To contact Stroke Continuity provider, please refer to [email or web address] After hours, contact General Neurology

## 2018-05-14 NOTE — Consult Note (Signed)
Neurology Consultation Reason for Consult: Left-sided weakness Referring Physician: Alvino Chapel, N  CC: Left-sided weakness  History is obtained from: Patient  HPI: Mackenzie Patterson is a 47 y.o. female with a history of previous stroke, VSD who presents with left-sided weakness and facial droop/slurred speech.  On arrival, she had right facial droop which she appeared unaware as well as significant slurred speech and left-sided weakness.  Code stroke was activated and she was taken for emergent CT scan, however she began improving in the scanner.  By then the scan she had minimal right facial droop and no significant weakness.   LKW: 8 PM tpa given?: no, mild, improving deficits   ROS: A 14 point ROS was performed and is negative except as noted in the HPI.   Past Medical History:  Diagnosis Date  . Asthma   . Breast discharge 01/10/2017  . Congenital ventricular septal defect   . Diabetes mellitus without complication ([place])   . Heart murmur   . Hypertension   . Obesity   . Pulmonary hypertension (HCC)    mild   . Tricuspid regurgitation   . VSD (ventricular septal defect), perimembranous    Qp:Qs 1.7:1 by catheterization      Family History  Problem Relation Age of Onset  . Cancer Other   . Hyperlipidemia Other   . Hypertension Other   . Asthma Other   . Diabetes Mother   . Diabetes Father      Social History:  reports that she has never smoked. She has never used smokeless tobacco. She reports that she does not drink alcohol or use drugs.   Exam: Current vital signs: BP 131/76 (BP Location: Right Arm)   Pulse 86   Temp 97.6 F (36.4 C) (Axillary)   Resp 17   Ht 4\' 11"  (1.499 m)   Wt 76.4 kg   LMP 05/01/2018 (Exact Date)   SpO2 97%   BMI 34.02 kg/m  Vital signs in last 24 hours: Temp:  [97.6 F (36.4 C)-98.7 F (37.1 C)] 97.6 F (36.4 C) (11/18 0000) Pulse Rate:  [86-113] 86 (11/18 0308) Resp:  [15-25] 17 (11/18 0000) BP: (131-192)/(76-118) 131/76 (11/18  0308) SpO2:  [97 %-100 %] 97 % (11/18 0308) Weight:  [76.4 kg] 76.4 kg (11/17 2029)   Physical Exam  Constitutional: Appears well-developed and well-nourished.  Psych: Affect appropriate to situation Eyes: No scleral injection HENT: No OP obstrucion Head: Normocephalic.  Cardiovascular: Normal rate and regular rhythm.  Respiratory: Effort normal, non-labored breathing GI: Soft.  No distension. There is no tenderness.  Skin: WDI  Neuro: Mental Status: Patient is awake, alert, oriented to person, place, month, year, and situation. Patient is able to give a clear and coherent history. No signs of aphasia or neglect Cranial Nerves: II: Visual Fields are full. Pupils are equal, round, and reactive to light.   III,IV, VI: EOMI without ptosis or diploplia.  V: Facial sensation is symmetric to temperature VII: Facial movement with decreased nasolabial fold on the right as well as right facial weakness VIII: hearing is intact to voice X: Uvula elevates symmetrically XI: Shoulder shrug is symmetric. XII: tongue is midline without atrophy or fasciculations.  Motor: Tone is normal. Bulk is normal. 5/5 strength was present on the right side and 4/5 in the left arm and leg initially, improving to 5-/5 Sensory: Sensation is symmetric to light touch and temperature in the arms and legs. Cerebellar: No clear ataxia    I have reviewed labs in epic  and the results pertinent to this consultation are: CMP-mild hyponatremia  I have reviewed the images obtained: CT/CTA-no LVO or other acute findings  Impression: 47 year old female with a history of stroke who presents with symptoms concerning for small brainstem TIA.  With a rapidly improving symptoms, she is not a candidate for IV TPA but I would favor admission for secondary risk factor modification.  Recommendations: - HgbA1c, fasting lipid panel - MRI of the brain without contrast - Frequent neuro checks - Echocardiogram -  Prophylactic therapy-Antiplatelet med: Aspirin - dose 325mg  PO or 300mg  PR, Plavix 300 mill grams load, followed by 75 mg daily for 3 weeks - Risk factor modification - Telemetry monitoring - Stroke team to follow  Roland Rack, MD [facility] [phone]  If 7pm- 7am, please page neurology on call as listed in [place].

## 2018-05-14 NOTE — Care Management Note (Signed)
Case Management Note  Patient Details  Name: Mackenzie Patterson MRN: [id number] Date of Birth: 07-23-1970  Subjective/Objective:  47yo female presented with right facial droop and Left arm weakness.                Action/Plan: CM met with patient to discuss dispositional needs. Patient lives at home, independent with ADLs PTA. PCP verified as: Domenica Fail. PT recommended outpatient PT/RW; Outpatient PT has been arranged; DME preference provided with [facility] selected. RW referral given to Jeneen Rinks, [facility] liaison; AVS updated. No further needs from CM.   Expected Discharge Date:  05/14/18               Expected Discharge Plan:  Home/Self Care  In-House Referral:  NA  Discharge planning Services  CM Consult  Post Acute Care Choice:  Durable Medical Equipment Choice offered to:  Patient  DME Arranged:  Gilford Rile rolling DME Agency:  [place]:  NA [place] Agency:  NA  Status of Service:  Completed, signed off  If discussed at [place] of Stay Meetings, dates discussed:    Additional Comments:  Midge Minium RN, BSN, NCM-BC, ACM-RN [phone] 05/14/2018, 4:12 PM

## 2018-05-14 NOTE — Procedures (Signed)
ELECTROENCEPHALOGRAM REPORT   Patient: Mackenzie Patterson       Room #: 0K59X EEG No. ID: [id number] Age: 47 y.o.        Sex: female Referring Physician: Reesa Chew Report Date:  05/14/2018        Interpreting Physician: Alexis Goodell  History: [name] is an 47 y.o. female with right facial droop and left arm weakness  Medications:  ASA, Lipitor, Plavix, Ferrous sulfate, Insulin, Dulera  Conditions of Recording:  This is a 21 channel routine scalp EEG performed with bipolar and monopolar montages arranged in accordance to the international 10/20 system of electrode placement. One channel was dedicated to EKG recording.  The patient is in the drowsy and asleep states.  Description:  Artifact is prominent during the recording often obscuring the background rhythm. When able to be visualized the background is slow and poorly organized.   It consists of a low voltage, polymorphic delta rhythm that is diffusely distributed and continuous with some intermixed theta activity noted as well.  The patient appears to achieve stage II sleep as well with symmetrical sleep spindles, vertex central sharp transients and irregular slow activity.  No epileptiform activity is noted.   Hyperventilation and intermittent photic stimulation were not performed.   IMPRESSION: This EEG is characterized by slowing which is consistent with normal drowse and sleep.  Can not rule out the possibility of slowing related to general cerebral disturbance such as a metabolic encephalopathy.  Clinical correlation recommended.  No epileptiform activity is noted.     Alexis Goodell, MD Neurology [phone] 05/14/2018, 1:39 PM

## 2018-05-14 NOTE — Progress Notes (Signed)
  Echocardiogram 2D Echocardiogram has been performed.  Mackenzie Patterson 05/14/2018, 11:54 AM

## 2018-05-15 ENCOUNTER — Ambulatory Visit: Payer: Medicaid Other | Admitting: Physical Therapy

## 2018-05-17 ENCOUNTER — Ambulatory Visit (HOSPITAL_COMMUNITY): Payer: Medicaid Other

## 2018-05-17 ENCOUNTER — Ambulatory Visit: Payer: Medicaid Other | Admitting: Physical Therapy

## 2018-05-22 ENCOUNTER — Ambulatory Visit: Payer: Medicaid Other | Admitting: Physical Therapy

## 2018-05-22 ENCOUNTER — Encounter: Payer: Self-pay | Admitting: Physical Therapy

## 2018-05-22 ENCOUNTER — Telehealth (INDEPENDENT_AMBULATORY_CARE_PROVIDER_SITE_OTHER): Payer: Self-pay

## 2018-05-22 DIAGNOSIS — M6281 Muscle weakness (generalized): Secondary | ICD-10-CM | POA: Diagnosis not present

## 2018-05-22 DIAGNOSIS — M25672 Stiffness of left ankle, not elsewhere classified: Secondary | ICD-10-CM | POA: Diagnosis not present

## 2018-05-22 DIAGNOSIS — M25572 Pain in left ankle and joints of left foot: Secondary | ICD-10-CM | POA: Diagnosis not present

## 2018-05-22 DIAGNOSIS — M25561 Pain in right knee: Secondary | ICD-10-CM | POA: Diagnosis not present

## 2018-05-22 DIAGNOSIS — R262 Difficulty in walking, not elsewhere classified: Secondary | ICD-10-CM | POA: Diagnosis not present

## 2018-05-22 NOTE — Telephone Encounter (Signed)
Received PRF form from [facility] patient assistance to complete to have Monovisc injection delivered.   Completed PRF form and faxed to [employer] at [phone].

## 2018-05-22 NOTE — Therapy (Signed)
[place], [place], [address] Phone: [phone]   Fax:  [phone]  Physical Therapy Treatment/Discharge Note  Patient Details  Name: Mackenzie Patterson MRN: [id number] Date of Birth: October 14, 1970 Referring Provider (PT): [name]   Encounter Date: 05/22/2018  PT End of Session - 05/22/18 1024    Visit Number  10    Number of Visits  12    Date for PT Re-Evaluation  05/22/18    Authorization Type  MCD 2nd-  04/16/2018 through  05/27/2018  12 visits    Authorization Time Period  04/16/2018 through 05/27/2018  12 visits(  used 4 visits,  asked for 8 more need to clarify if 8 more were issued or if 12 more were issued)     Authorization - Visit Number  9    Authorization - Number of Visits  12    PT Start Time  1017    PT Stop Time  1059    PT Time Calculation (min)  42 min    Activity Tolerance  Patient tolerated treatment well    Behavior During Therapy  Flat affect       Past Medical History:  Diagnosis Date  . Asthma   . Breast discharge 01/10/2017  . Congenital ventricular septal defect   . Diabetes mellitus without complication ([place])   . Heart murmur   . Hypertension   . Obesity   . Pulmonary hypertension (HCC)    mild   . Tricuspid regurgitation   . VSD (ventricular septal defect), perimembranous    Qp:Qs 1.7:1 by catheterization     Past Surgical History:  Procedure Laterality Date  . CARDIAC CATHETERIZATION [name] 10/30/2014   Procedure: Right Heart Cath;  Surgeon: [name], MD;  Location: [facility] INVASIVE CV LAB CUPID;  Service: Cardiovascular;  Laterality: [name];  . CHOLECYSTECTOMY  2018  . CYST EXCISION    . LEFT AND RIGHT HEART CATHETERIZATION WITH CORONARY ANGIOGRAM [name] 09/11/2013   Procedure: LEFT AND RIGHT HEART CATHETERIZATION WITH CORONARY ANGIOGRAM;  Surgeon: [name], MD;  Location: [facility] CATH LAB;  Service: Cardiovascular;  Laterality: [name];  . RIGHT HEART CATH [name] 09/02/2016    Procedure: Right Heart Cath;  Surgeon: [name], MD;  Location: [place] CV LAB;  Service: Cardiovascular;  Laterality: [name];  . TUBAL LIGATION      There were no vitals filed for this visit.  Subjective Assessment - 05/22/18 1026    Subjective  I had a TIA on 05-15-18 and went to the ER and they admitted me into the hospital.  I stayed for 2 days and got a rolling walker. I dont use my walker any more.  I  will see [name]  after I finish my knee rehab and then I can start my TIA rehab.      How long can you stand comfortably?  I stood about 2 to 3 hours and I am in so much pain 5/10    How long can you walk comfortably?  I can walk about 2 hours    Diagnostic tests  x-rays arthritis    Currently in Pain?  Yes    Pain Score  0-No pain   has increased pain when standing longer than 3 hours.  can be as high at 5/10   Pain Location  Knee    Pain Orientation  Right         [facility] PT Assessment - 05/22/18 0001  Assessment   Medical Diagnosis  Rt knee chondromalacia    Referring Provider (PT)  [name]      Observation/Other Assessments   Other Surveys   Other Surveys    Lower Extremity Functional Scale   [id number]     Functional Tests   Functional tests  Squat;Single leg stance;Sit to Stand      Squat   Comments  Pt able to perform  sink squat      Single Leg Stance   Comments  SLS Rt  30 sec and left with visible cocontraction since TIA 17 sec.      Sit to Stand   Comments  12.56 5 x STS      AROM   Right Knee Extension  0    Right Knee Flexion  138    Left Knee Extension  0    Left Knee Flexion  145      Strength   Right Hip Flexion  5/5    Right Hip Extension  --   5-/5   Right Hip ABduction  4+/5    Left Hip Flexion  4/5    Left Hip Extension  4-/5    Left Hip ABduction  4-/5    Right Knee Flexion  5/5    Right Knee Extension  5/5    Left Knee Flexion  4/5    Left Knee Extension  4/5    Right Ankle Dorsiflexion  5/5    Left Ankle  Dorsiflexion  4/5                   [facility] Adult PT Treatment/Exercise - 05/22/18 0001      Self-Care   Self-Care  Other Self-Care Comments    Other Self-Care Comments   discussed community wellness and aquatics      Exercises   Exercises  Knee/Hip      Knee/Hip Exercises: Stretches   Active Hamstring Stretch  Right   15 knee presses with leg up straight   Quad Stretch  Right;2 reps;30 seconds   prone with strap   Hip Flexor Stretch  Right;2 reps;30 seconds   on EOB     Knee/Hip Exercises: Standing   Lateral Step Up  Hand Hold: 2;15 reps    Lateral Step Up Limitations  x 1    Forward Step Up  Hand Hold: 2;15 reps   x 1   Wall Squat  15 reps    Wall Squat Limitations  10 sec hold    SLS  Rt with FWD leans, 2x10 VC for form    Walking with Sports Cord  mini squat with blue t band on medial knee x 15 1/2 squat    Other Standing Knee Exercises  4 way SLR right and left 3 x 15 each     5# weight on right left leg with visible cocontraction     Knee/Hip Exercises: Supine   Quad Sets  Strengthening;Both;10 reps    Bridges  10 reps   each    Bridges Limitations  regular, figure 4, full up-half down-ful up-all down, heels together toes out    Single Leg Bridge  2 sets;Left;10 reps;Right    Knee Extension Limitations  single limb sit to stand 15 x 2 and 1 x 15 on ther ex pad for added height initially   right knee only   Other Supine Knee/Hip Exercises  single limb stance on right with  clocks  for  5 minutes   right only     Knee/Hip Exercises: Prone   Hamstring Curl  5 reps;3 sets   3 lb   right side            PT Education - 05/22/18 1137    Education Details  reviewed HEP for right knee tendonopathy and also educated on importance of proprioception for both LE post TIA to be addressed by Dr. Altamease Oiler at next MD visit    Person(s) Educated  Patient    Methods  Explanation;Demonstration    Comprehension  Verbalized understanding;Returned demonstration        PT Short Term Goals - 05/22/18 1143      PT SHORT TERM GOAL #1   Title  Patient will be independent with basic HEP for lower body .     Time  4    Period  Weeks    Status  Achieved      PT SHORT TERM GOAL #2   Title  improve LEFS score =/> 9 point improvement,     Baseline  64 today on DC    Time  4    Period  Weeks    Status  Achieved      PT SHORT TERM GOAL #3   Title  increase Rt prone quad flexibility =/> 115 degrees    Time  4    Period  Weeks    Status  Achieved      PT SHORT TERM GOAL #4   Title  improve Rt knee and hip strength to allow her to progress to SLS exercises    Baseline  Right knee and hip  4 to 5-/5    Time  4    Period  Weeks    Status  Achieved        PT Long Term Goals - 05/22/18 1054      PT LONG TERM GOAL #1   Title  Pt will be independent with advanced HEP    Baseline  Pt will be able to tolerate standing and advanced HEP    Time  6    Period  Weeks    Status  Achieved      PT LONG TERM GOAL #2   Title  Pt will be able to flex right knee in prone to 130 degrees     Baseline  Pt was 138 on today on right . Left is 145    Time  6    Period  Weeks    Status  Achieved      PT LONG TERM GOAL #3   Title  improve LEFS score =/> 9 point improvement,     Baseline  LEF on 05-22-18 64 today    Time  6    Period  Weeks    Status  Achieved      PT LONG TERM GOAL #4   Title  Pt will be able to tape her own knee to correct lateral tracking to be able to stand and exercise as needed    Baseline  Pt able to demo on knee taping    Time  6    Period  Weeks    Status  Achieved            Plan - 05/22/18 1102    Clinical Impression Statement  Pt presents to clinic today with no assistive device for last visit before DC.  Pt reports having a TIA on 05-15-18 affecting Left side. And  was using rolling walker for unsteadiness.  But is not using today.  Pt has increased weakness in left LE today. Right knee is now her stronger knee. And pt  feels like she knows all her exercises and plans to continue with aquatics at the gym.   Pt will see [name] for further work up and was told she might continue PT at neuro if needed after she finishes PT here for  right knee tendonopathy. Pt reports no knee pain today in clinic.  She does say she has increased pain after standing for 2-3 hours.  Ms Howden will need a new order for any neurological deficits unresolved. .  Pt explained importance of loading knee for tendonopathy but that TIA symptoms still need exercise and she may need further PT in the future for left side weakness noted in re assessment today.  Pt may benefit from neuro PT.  Pt is independent with HEP for Right knee and agrees with DC so she can concentrate on her other health issues.      PT Frequency  2x / week    PT Duration  6 weeks    PT Treatment/Interventions  Iontophoresis 39m/ml Dexamethasone;Gait training;Stair training;Neuromuscular re-education;Dry needling;Manual techniques;Moist Heat;Therapeutic activities;Patient/family education;Taping;Vasopneumatic Device;Therapeutic exercise;Cryotherapy;Electrical Stimulation;Balance training    PT Next Visit Plan  DC    PT Home Exercise Plan  KNee standing, steps and proprioception issued    Consulted and Agree with Plan of Care  Patient       Patient will benefit from skilled therapeutic intervention in order to improve the following deficits and impairments:  Pain, Decreased mobility, Decreased range of motion, Decreased strength, Impaired flexibility  Visit Diagnosis: Acute pain of right knee  Muscle weakness (generalized)  Stiffness of left ankle, not elsewhere classified  Pain in left ankle and joints of left foot  Difficulty in walking, not elsewhere classified     Problem List Patient Active Problem List   Diagnosis Date Noted  . Enlarged uterus 05/10/2018  . 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 TIA (transient ischemic attack) 10/24/2017  .  Menorrhagia 12/12/2016  . Ventricular septal defect (VSD), membranous 09/02/2016  . Pulmonary hypertension, primary ([place] 08/23/2016  . Chest tightness 08/03/2016  . Cough 08/03/2016  . Dyspnea and respiratory abnormality 04/16/2014  . Tricuspid regurgitation   . Asthma   . VSD (ventricular septal defect), perimembranous 09/03/2013  . Pulmonary hypertension ([place] 09/03/2013  . Essential hypertension 08/06/2013  . Diabetes mellitus ([place] 08/06/2013  . Obesity 08/06/2013  . Hypertension 07/27/2012  . Awareness of heartbeats 07/27/2012  . 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 Perimembranous ventricular septal defect 07/27/2012    LVoncille Lo PT Certified Exercise Expert for the Aging Adult  05/22/18 11:46 AM Phone: [phone]: [facility] [address]Phone: [phone]  Fax:  [phone]  PHYSICAL THERAPY DISCHARGE SUMMARY  Visits from Start of Care: 10  Current functional level related to goals / functional outcomes: Good for right knee.     Remaining deficits: New problems with TIA on 05-15-18 and will have evaluation by Dr. GAltamease Oilerand be advised and receive order for PT as needed. Pt does demonstrate L LE weakness compared to Right. Pt with unsteadiness on feet on left LE.    Education / Equipment: HEP  Plan: Patient agrees to discharge.  Patient goals were met. Patient is being discharged due to meeting the stated rehab goals.  ?????  Pt is pleased with right knee progress.  Pt will continue work up for recent TIA and pursue further PT as advised by Dr. Altamease Oiler.   Name: Hannalee Castor MRN: [id number] Date of Birth: 1970/08/28   Voncille Lo, PT Certified Exercise Expert for the Aging Adult  05/22/18 11:56 AM Phone: [phone] Fax: [phone]

## 2018-06-05 ENCOUNTER — Telehealth: Payer: Self-pay | Admitting: General Practice

## 2018-06-05 NOTE — Telephone Encounter (Signed)
-----   Message from [place], [place] sent at 06/05/2018  7:32 AM EST ----- Regarding: Needs U/S This patient did not keep her U/S appt due to being hospitalized for stroke. Therefore, I have no reason to see her on Thursday 06/07/18. Please contact the patient to get her U/S rescheduled.  Thanks, [employer]

## 2018-06-05 NOTE — Telephone Encounter (Signed)
Called and notified patient of appt being cancelled to see Mackenzie Patterson, CNM for [name] f/u.  Patient stated that she will call to reschedule [name] and will give our office a call to reschedule with provider.

## 2018-06-06 ENCOUNTER — Ambulatory Visit (INDEPENDENT_AMBULATORY_CARE_PROVIDER_SITE_OTHER): Payer: Medicaid Other | Admitting: Physician Assistant

## 2018-06-07 ENCOUNTER — Ambulatory Visit: Payer: Medicaid Other | Admitting: Obstetrics and Gynecology

## 2018-06-13 ENCOUNTER — Ambulatory Visit: Payer: Medicaid Other | Admitting: Neurology

## 2018-07-03 ENCOUNTER — Telehealth (INDEPENDENT_AMBULATORY_CARE_PROVIDER_SITE_OTHER): Payer: Self-pay | Admitting: Physician Assistant

## 2018-07-03 ENCOUNTER — Telehealth (INDEPENDENT_AMBULATORY_CARE_PROVIDER_SITE_OTHER): Payer: Self-pay

## 2018-07-03 NOTE — Telephone Encounter (Signed)
Fwd to pcp. Nat Christen, CMA

## 2018-07-03 NOTE — Telephone Encounter (Signed)
Talked with patient and advised her that I had received her Monovisc injection from [facility] for right knee.  Patient stated that she would like to hold off on getting injection.  Stated that therapy has helped with right knee.

## 2018-07-03 NOTE — Telephone Encounter (Signed)
Patient called to confirm appointment time and date. Front desk inform patient that the appointment was cancelled on Nov 21 due to the provider not being in clinic. Patient is concern of her blood sugar being to high since starting metformin. Patient states the the readings are in the 500 range. Patient states she is wanting to go back to sitaGLIPtin-metformin (JANUMET) 50-1000 MG tablet  Stating that her blood sugars were at 80-112 while taking janumet.  Please advice [place]

## 2018-07-03 NOTE — Telephone Encounter (Signed)
Pt seen on 04/12/18 and reported Januvia was causing her blood sugars to drop to "47 and 60s". She is to return to see her new provider and should be seen soon. She should go to urgent care in the mean time.

## 2018-07-05 ENCOUNTER — Ambulatory Visit (INDEPENDENT_AMBULATORY_CARE_PROVIDER_SITE_OTHER): Payer: Medicaid Other | Admitting: Physician Assistant

## 2018-07-05 NOTE — Telephone Encounter (Signed)
Patient is aware. States she has been to urgent care and they refused to give her mediations since she had a PCP. Advised patient that until her appointment on 1/28 she may need to return to urgent care or ED. She may also call our office to see if an open time slot is available before the 28th. Nat Christen, CMA

## 2018-07-24 ENCOUNTER — Ambulatory Visit (INDEPENDENT_AMBULATORY_CARE_PROVIDER_SITE_OTHER): Payer: Medicaid Other | Admitting: Family Medicine

## 2018-07-24 ENCOUNTER — Encounter (INDEPENDENT_AMBULATORY_CARE_PROVIDER_SITE_OTHER): Payer: Self-pay | Admitting: Family Medicine

## 2018-07-24 ENCOUNTER — Other Ambulatory Visit: Payer: Self-pay

## 2018-07-24 VITALS — BP 130/80 | HR 89 | Temp 97.5°F | Ht 59.0 in | Wt 164.2 lb

## 2018-07-24 DIAGNOSIS — R110 Nausea: Secondary | ICD-10-CM | POA: Diagnosis not present

## 2018-07-24 DIAGNOSIS — Z7982 Long term (current) use of aspirin: Secondary | ICD-10-CM | POA: Diagnosis not present

## 2018-07-24 DIAGNOSIS — Q210 Ventricular septal defect: Secondary | ICD-10-CM | POA: Diagnosis not present

## 2018-07-24 DIAGNOSIS — Z79899 Other long term (current) drug therapy: Secondary | ICD-10-CM

## 2018-07-24 DIAGNOSIS — I10 Essential (primary) hypertension: Secondary | ICD-10-CM | POA: Diagnosis not present

## 2018-07-24 DIAGNOSIS — L309 Dermatitis, unspecified: Secondary | ICD-10-CM

## 2018-07-24 DIAGNOSIS — J309 Allergic rhinitis, unspecified: Secondary | ICD-10-CM

## 2018-07-24 DIAGNOSIS — J4520 Mild intermittent asthma, uncomplicated: Secondary | ICD-10-CM | POA: Diagnosis not present

## 2018-07-24 DIAGNOSIS — D509 Iron deficiency anemia, unspecified: Secondary | ICD-10-CM | POA: Diagnosis not present

## 2018-07-24 DIAGNOSIS — Z8673 Personal history of transient ischemic attack (TIA), and cerebral infarction without residual deficits: Secondary | ICD-10-CM | POA: Diagnosis not present

## 2018-07-24 DIAGNOSIS — E119 Type 2 diabetes mellitus without complications: Secondary | ICD-10-CM | POA: Diagnosis not present

## 2018-07-24 DIAGNOSIS — E781 Pure hyperglyceridemia: Secondary | ICD-10-CM

## 2018-07-24 MED ORDER — PANTOPRAZOLE SODIUM 20 MG PO TBEC: 20.0000 mg | DELAYED_RELEASE_TABLET | Freq: Every day | ORAL | 11 refills | Status: DC

## 2018-07-24 MED ORDER — AMLODIPINE BESYLATE 5 MG PO TABS: 5.0000 mg | ORAL_TABLET | Freq: Every day | ORAL | 1 refills | Status: DC

## 2018-07-24 MED ORDER — ALBUTEROL SULFATE HFA 108 (90 BASE) MCG/ACT IN AERS: 2.0000 | INHALATION_SPRAY | Freq: Four times a day (QID) | RESPIRATORY_TRACT | 5 refills | Status: DC | PRN

## 2018-07-24 MED ORDER — ATORVASTATIN CALCIUM 20 MG PO TABS: 20.0000 mg | ORAL_TABLET | Freq: Every day | ORAL | 3 refills | Status: DC

## 2018-07-24 MED ORDER — BUDESONIDE-FORMOTEROL FUMARATE 160-4.5 MCG/ACT IN AERO: 2.0000 | INHALATION_SPRAY | Freq: Two times a day (BID) | RESPIRATORY_TRACT | 5 refills | Status: DC

## 2018-07-24 MED ORDER — LISINOPRIL 20 MG PO TABS: 20.0000 mg | ORAL_TABLET | Freq: Every day | ORAL | 3 refills | Status: DC

## 2018-07-24 MED ORDER — CARVEDILOL 6.25 MG PO TABS: 6.2500 mg | ORAL_TABLET | Freq: Two times a day (BID) | ORAL | 11 refills | Status: DC

## 2018-07-24 MED ORDER — ASPIRIN EC 81 MG PO TBEC: 81.0000 mg | DELAYED_RELEASE_TABLET | Freq: Every day | ORAL | 3 refills | Status: DC

## 2018-07-24 MED ORDER — CETIRIZINE HCL 10 MG PO TABS: 10.0000 mg | ORAL_TABLET | Freq: Every day | ORAL | 11 refills | Status: DC

## 2018-07-24 MED ORDER — JANUMET 50-1000 MG PO TABS: 1.0000 | ORAL_TABLET | Freq: Two times a day (BID) | ORAL | 5 refills | Status: DC

## 2018-07-24 MED ORDER — CLOTRIMAZOLE-BETAMETHASONE 1-0.05 % EX CREA: 1.0000 "application " | TOPICAL_CREAM | Freq: Two times a day (BID) | CUTANEOUS | 5 refills | Status: DC

## 2018-07-24 NOTE — Progress Notes (Deleted)
Subjective:    [name], female    DOB: 09/26/1970, 48 y.o.   MRN: [id number]  HPI      48 yo female who is seen in follow-up of chronic medical issues.  Patient with history of type 2 diabetes, essential hypertension, hyperlipidemia, morbid obesity and patient with a history of ventral septal defect followed at [facility], pulmonary hypertension/pulmonary arterial hypertension who is status post hospital admission 05/13/2017 through 05/14/2018 secondary to presenting to the emergency department with complaint of slurred speech, right-sided facial droop and left upper extremity weakness which had also occurred approximately 8 months prior.  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  Patient had EEG which was negative for any acute seizure activity.  Patient was diagnosed with TIA and was started on aspirin and Plavix for 3 weeks and then Plavix could be discontinued and continue aspirin.  Past Medical History:  Diagnosis Date  . Asthma   . Breast discharge 01/10/2017  . Congenital ventricular septal defect   . Diabetes mellitus without complication ([place])   . Heart murmur   . Hypertension   . Obesity   . Pulmonary hypertension (HCC)    mild   . Tricuspid regurgitation   . VSD (ventricular septal defect), perimembranous    Qp:Qs 1.7:1 by catheterization    Past Surgical History:  Procedure Laterality Date  . CARDIAC CATHETERIZATION [name] 10/30/2014   Procedure: Right Heart Cath;  Surgeon: [name], MD;  Location: [facility] INVASIVE CV LAB CUPID;  Service: Cardiovascular;  Laterality: [name];  . CHOLECYSTECTOMY  2018  . CYST EXCISION    . LEFT AND RIGHT HEART CATHETERIZATION WITH CORONARY ANGIOGRAM [name] 09/11/2013   Procedure: LEFT AND RIGHT HEART CATHETERIZATION WITH CORONARY ANGIOGRAM;  Surgeon: [name], MD;  Location: [facility] CATH LAB;  Service: Cardiovascular;  Laterality: [name];  . RIGHT HEART CATH [name] 09/02/2016   Procedure: Right  Heart Cath;  Surgeon: [name], MD;  Location: [place] CV LAB;  Service: Cardiovascular;  Laterality: [name];  . TUBAL LIGATION     Family History  Problem Relation Age of Onset  . Cancer Other   . Hyperlipidemia Other   . Hypertension Other   . Asthma Other   . Diabetes Mother   . Diabetes Father    Social History   Tobacco Use  . Smoking status: Never Smoker  . Smokeless tobacco: Never Used  Substance Use Topics  . Alcohol use: No  . Drug use: No   Allergies  Allergen Reactions  . Penicillins Anaphylaxis    Has patient had a PCN reaction causing immediate rash, facial/tongue/throat swelling, SOB or lightheadedness with hypotension: Yes Has patient had a PCN reaction causing severe rash involving mucus membranes or skin necrosis: Yes Has patient had a PCN reaction that required hospitalization Yes- went to ED, was not admitted Has patient had a PCN reaction occurring within the last 10 years: No- longer then 10 years If all of the above answers are "NO", then may proceed with Cephalosporin use.   . Tape Rash    Other reaction(s): Other (See Comments) Burns skin  . Other Rash    Other reaction(s): Other (See Comments) **EKG GEL PADS**  "Burns my skin" Pt states she is allergic to a blood pressure medication, unsure.  **EKG GEL PADS**  "Burns my skin"  . Latex Rash    Review of Systems     Objective:   Physical  Exam BP 130/80 (BP Location: Left Arm, Patient Position: Sitting, Cuff Size: Normal)   Pulse 89   Temp (!) 97.5 F (36.4 C) (Oral)   Ht 4\' 11"  (1.499 m)   Wt 164 lb 3.2 oz (74.5 kg)   LMP 07/17/2018 (Exact Date)   SpO2 98%   BMI 33.16 kg/m         Assessment & Plan:  1. Controlled type 2 diabetes mellitus without complication, without long-term current use of insulin (HCC) *** - Comprehensive metabolic panel  2. Essential hypertension *** - lisinopril (PRINIVIL,ZESTRIL) 20 MG tablet; Take 1 tablet (20 mg total) by mouth daily.   Dispense: 90 tablet; Refill: 3 - carvedilol (COREG) 6.25 MG tablet; Take 1 tablet (6.25 mg total) by mouth 2 (two) times daily with a meal.  Dispense: 60 tablet; Refill: 11  3. Mild intermittent asthma without complication *** - budesonide-formoterol (SYMBICORT) 160-4.5 MCG/ACT inhaler; Inhale 2 puffs into the lungs 2 (two) times daily.  Dispense: 1 Inhaler; Refill: 5 - albuterol (PROVENTIL HFA;VENTOLIN HFA) 108 (90 Base) MCG/ACT inhaler; Inhale 2 puffs into the lungs every 6 (six) hours as needed for wheezing or shortness of breath.  Dispense: 1 Inhaler; Refill: 5  4. Hypertension, unspecified type ***  5. Hypertriglyceridemia *** - atorvastatin (LIPITOR) 20 MG tablet; Take 1 tablet (20 mg total) by mouth daily.  Dispense: 90 tablet; Refill: 3  6. History of TIA (transient ischemic attack) *** - aspirin EC 81 MG tablet; Take 1 tablet (81 mg total) by mouth daily.  Dispense: 90 tablet; Refill: 3  7. Nausea *** - Comprehensive metabolic panel - pantoprazole (PROTONIX) 20 MG tablet; Take 1 tablet (20 mg total) by mouth daily. For stomach protection  Dispense: 30 tablet; Refill: 11  8. Encounter for long-term (current) use of medications ***  9. Long term (current) use of aspirin *** - CBC with Differential - pantoprazole (PROTONIX) 20 MG tablet; Take 1 tablet (20 mg total) by mouth daily. For stomach protection  Dispense: 30 tablet; Refill: 11  10. Iron deficiency anemia, unspecified iron deficiency anemia type *** - CBC with Differential  11. Ventricular septal defect (VSD), membranous ***  12. Allergic rhinitis, unspecified seasonality, unspecified trigger *** - cetirizine (ZYRTEC) 10 MG tablet; Take 1 tablet (10 mg total) by mouth daily. At bedtime as needed  Dispense: 30 tablet; Refill: 11  13. Dermatitis ***

## 2018-07-25 LAB — COMPREHENSIVE METABOLIC PANEL WITH GFR
ALT: 20 IU/L (ref 0–32)
AST: 22 IU/L (ref 0–40)
Albumin/Globulin Ratio: 1.5 (ref 1.2–2.2)
Albumin: 4.5 g/dL (ref 3.8–4.8)
Alkaline Phosphatase: 83 IU/L (ref 39–117)
BUN/Creatinine Ratio: 11 (ref 9–23)
BUN: 10 mg/dL (ref 6–24)
Bilirubin Total: 0.3 mg/dL (ref 0.0–1.2)
CO2: 21 mmol/L (ref 20–29)
Calcium: 9.1 mg/dL (ref 8.7–10.2)
Chloride: 103 mmol/L (ref 96–106)
Creatinine, Ser: 0.89 mg/dL (ref 0.57–1.00)
GFR calc Af Amer: 90 mL/min/1.73
GFR calc non Af Amer: 78 mL/min/1.73
Globulin, Total: 3.1 g/dL (ref 1.5–4.5)
Glucose: 96 mg/dL (ref 65–99)
Potassium: 4 mmol/L (ref 3.5–5.2)
Sodium: 138 mmol/L (ref 134–144)
Total Protein: 7.6 g/dL (ref 6.0–8.5)

## 2018-07-25 LAB — CBC WITH DIFFERENTIAL/PLATELET
Basophils Absolute: 0 x10E3/uL (ref 0.0–0.2)
Basos: 0 %
EOS (ABSOLUTE): 0.1 x10E3/uL (ref 0.0–0.4)
Eos: 3 %
Hematocrit: 30.5 % — ABNORMAL LOW (ref 34.0–46.6)
Hemoglobin: 9.7 g/dL — ABNORMAL LOW (ref 11.1–15.9)
Immature Grans (Abs): 0 x10E3/uL (ref 0.0–0.1)
Immature Granulocytes: 1 %
Lymphocytes Absolute: 1.9 x10E3/uL (ref 0.7–3.1)
Lymphs: 35 %
MCH: 22.9 pg — ABNORMAL LOW (ref 26.6–33.0)
MCHC: 31.8 g/dL (ref 31.5–35.7)
MCV: 72 fL — ABNORMAL LOW (ref 79–97)
Monocytes Absolute: 0.5 x10E3/uL (ref 0.1–0.9)
Monocytes: 10 %
Neutrophils Absolute: 2.8 x10E3/uL (ref 1.4–7.0)
Neutrophils: 51 %
Platelets: 265 x10E3/uL (ref 150–450)
RBC: 4.23 x10E6/uL (ref 3.77–5.28)
RDW: 17.2 % — ABNORMAL HIGH (ref 11.7–15.4)
WBC: 5.4 x10E3/uL (ref 3.4–10.8)

## 2018-07-29 ENCOUNTER — Other Ambulatory Visit: Payer: Self-pay | Admitting: Family Medicine

## 2018-07-29 DIAGNOSIS — D509 Iron deficiency anemia, unspecified: Secondary | ICD-10-CM

## 2018-07-29 MED ORDER — FERROUS SULFATE 325 (65 FE) MG PO TABS: 325.0000 mg | ORAL_TABLET | Freq: Two times a day (BID) | ORAL | 2 refills | Status: DC

## 2018-07-29 NOTE — Progress Notes (Signed)
[name], female   DOB: 1971-06-07, 47 y.o.   MRN: [id number]   Patient with recent labs with Hgb of 9.7 and a low MCV consistent with iron deficiency anemia and patient will be notified of the results by the [place] as well as the need for return for a lab visit to check CBC in 2-3 weeks and that a prescription has been sent into patient's pharmacy for ferrous sulfate to take 2 pills twice daily after a meal.

## 2018-07-31 ENCOUNTER — Telehealth: Payer: Self-pay | Admitting: General Practice

## 2018-07-31 ENCOUNTER — Telehealth (INDEPENDENT_AMBULATORY_CARE_PROVIDER_SITE_OTHER): Payer: Self-pay

## 2018-07-31 NOTE — Telephone Encounter (Signed)
Patient aware of [name] appt scheduled for 08/06/18 at 9:30am at [facility].  Also patient was informed of follow up appt here at the office on 08/16/18 at 9:50am to review U/S results.  Patient verbalized understanding.

## 2018-07-31 NOTE — Telephone Encounter (Signed)
Patient is aware that she iron deficiency anemia as her Hgb is 9.7 with normal being 11.1-15.9. she is aware that a prescription for iron supplement has been sent to the pharmacy for her to take twice daily after meals. She is aware that she will need a lab visit in 2-3 weeks to recheck blood count. CMP normal. Patient requesting a stool softener be sent to her pharmacy. Nat Christen, CMA

## 2018-07-31 NOTE — Telephone Encounter (Signed)
-----   Message from Antony Blackbird, MD sent at 07/29/2018  9:47 PM EST ----- Please notify patient that it appears that she has iron deficiency anemia as her Hgb is 9.7 with normal being 11.1-15.9. Patient needs to be on an iron supplement and a prescription for ferrous sulfate will be sent into her pharmacy to take twice daily after a meal and she should have lab visit in about 2-3 weeks to recheck her blood counts. Her complete metabolic panel was normal

## 2018-08-01 DIAGNOSIS — I10 Essential (primary) hypertension: Secondary | ICD-10-CM | POA: Diagnosis not present

## 2018-08-01 DIAGNOSIS — Q210 Ventricular septal defect: Secondary | ICD-10-CM | POA: Diagnosis not present

## 2018-08-06 ENCOUNTER — Telehealth: Payer: Self-pay | Admitting: General Practice

## 2018-08-06 ENCOUNTER — Ambulatory Visit (HOSPITAL_COMMUNITY)
Admission: RE | Admit: 2018-08-06 | Discharge: 2018-08-06 | Disposition: A | Discharge status: Home / Self Care | Payer: Medicaid Other | Source: Ambulatory Visit | Attending: Obstetrics and Gynecology | Admitting: Obstetrics and Gynecology

## 2018-08-06 DIAGNOSIS — N852 Hypertrophy of uterus: Secondary | ICD-10-CM | POA: Diagnosis not present

## 2018-08-06 DIAGNOSIS — D251 Intramural leiomyoma of uterus: Secondary | ICD-10-CM | POA: Diagnosis not present

## 2018-08-06 NOTE — Telephone Encounter (Signed)
Patient notified of appointment scheduled at [facility] on 08/14/18 at 11:00am to review U/S results and start plan of care.  Patient verbalized understanding.

## 2018-08-07 ENCOUNTER — Other Ambulatory Visit: Payer: Self-pay | Admitting: Family Medicine

## 2018-08-07 DIAGNOSIS — D509 Iron deficiency anemia, unspecified: Secondary | ICD-10-CM

## 2018-08-07 MED ORDER — DOCUSATE SODIUM 100 MG PO CAPS: 100.0000 mg | ORAL_CAPSULE | Freq: Every evening | ORAL | 3 refills | Status: DC | PRN

## 2018-08-07 NOTE — Telephone Encounter (Signed)
RX sent to patient's pharmacy for colace 100 mg at bedtime as needed or she may wish to take this daily while on iron therapy

## 2018-08-07 NOTE — Telephone Encounter (Signed)
Patient aware that colace has been sent. She is aware that she can take at bedtime as needed or daily while on iron therapy. Nat Christen, CMA

## 2018-08-07 NOTE — Progress Notes (Signed)
[name], female   DOB: 1970/10/04, 48 y.o.   MRN: [id number]   RX sent to patient's pharmacy for iron due to iron deficiency and patient requests that RX for stool softener be sent into her pharmacy. RX will be sent in for Colace 100 mg at bedtime.

## 2018-08-14 ENCOUNTER — Ambulatory Visit (INDEPENDENT_AMBULATORY_CARE_PROVIDER_SITE_OTHER): Payer: Medicaid Other | Admitting: Obstetrics & Gynecology

## 2018-08-14 ENCOUNTER — Encounter: Payer: Self-pay | Admitting: Obstetrics & Gynecology

## 2018-08-14 VITALS — BP 125/69 | HR 72 | Wt 165.0 lb

## 2018-08-14 DIAGNOSIS — N8003 Adenomyosis of the uterus: Secondary | ICD-10-CM | POA: Insufficient documentation

## 2018-08-14 DIAGNOSIS — N809 Endometriosis, unspecified: Secondary | ICD-10-CM

## 2018-08-14 DIAGNOSIS — D219 Benign neoplasm of connective and other soft tissue, unspecified: Secondary | ICD-10-CM | POA: Diagnosis not present

## 2018-08-14 DIAGNOSIS — N939 Abnormal uterine and vaginal bleeding, unspecified: Secondary | ICD-10-CM | POA: Insufficient documentation

## 2018-08-14 MED ORDER — MEGESTROL ACETATE 40 MG PO TABS: 80.0000 mg | ORAL_TABLET | Freq: Every day | ORAL | 5 refills | Status: DC

## 2018-08-14 MED ORDER — NAPROXEN 500 MG PO TABS: 500.0000 mg | ORAL_TABLET | Freq: Two times a day (BID) | ORAL | 2 refills | Status: DC

## 2018-08-14 NOTE — Progress Notes (Signed)
GYNECOLOGY OFFICE VISIT NOTE  History:   Mackenzie Patterson [id number] here today for discussion of ultrasound results; obtained after enlarged uterus was noted during her exam at [facility].  Reports long history of AUB and pain; interested in definitive surgical management.  Has tried OCPs in past, but is hesistant to take any hormones due to her complicated cerebrovascular and cardiovascular history (see problem list below). Her cardiologist is at [facility].  She  Reports constant moderate pain 2/2 uterus, currently 5/10, helped by Tylenol.  Denies any current abnormal vaginal discharge, bleeding or other concerns. Accompanied by her mother  Patient Active Problem List   Diagnosis Date Noted  . Adenomyosis 08/14/2018  . Abnormal uterine bleeding (AUB) 08/14/2018  . Fibroids 05/10/2018  . 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 TIA (transient ischemic attack) 10/24/2017  . Tricuspid regurgitation   . Asthma   . VSD (ventricular septal defect), perimembranous 09/03/2013  . Pulmonary hypertension ([place]) 09/03/2013  . Essential hypertension 08/06/2013  . Diabetes mellitus ([place]) 08/06/2013  . Obesity 08/06/2013  . Hypertension 07/27/2012  . 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 Perimembranous ventricular septal defect 07/27/2012    Past Medical History:  Diagnosis Date  . Asthma   . Breast discharge 01/10/2017  . Congenital ventricular septal defect   . Diabetes mellitus without complication ([place])   . Heart murmur   . Hypertension   . Obesity   . Pulmonary hypertension (HCC)    mild   . Tricuspid regurgitation   . VSD (ventricular septal defect), perimembranous    Qp:Qs 1.7:1 by catheterization     Past Surgical History:  Procedure Laterality Date  . CARDIAC CATHETERIZATION [name] 10/30/2014   Procedure: Right Heart Cath;  Surgeon: [name], MD;  Location: [facility] INVASIVE CV LAB CUPID;  Service: Cardiovascular;  Laterality: [name];  . CHOLECYSTECTOMY  2018  . CYST  EXCISION    . LEFT AND RIGHT HEART CATHETERIZATION WITH CORONARY ANGIOGRAM [name] 09/11/2013   Procedure: LEFT AND RIGHT HEART CATHETERIZATION WITH CORONARY ANGIOGRAM;  Surgeon: [name], MD;  Location: [facility] CATH LAB;  Service: Cardiovascular;  Laterality: [name];  . RIGHT HEART CATH [name] 09/02/2016   Procedure: Right Heart Cath;  Surgeon: [name], MD;  Location: [place] CV LAB;  Service: Cardiovascular;  Laterality: [name];  . TUBAL LIGATION     The following portions of the patient's history were reviewed and updated as appropriate: allergies, current medications, past family history, past medical history, past social history, past surgical history and problem list.   Health Maintenance:  Normal pap and negative HRHPV on11/14/2019.  Normal mammogram on 03/21/2018.   Review of Systems:  Pertinent items noted in HPI and remainder of comprehensive ROS otherwise negative.    Objective:    Physical Exam BP 125/69   Pulse 72   Wt 165 lb (74.8 kg)   LMP 07/17/2018 (Exact Date)   BMI 33.33 kg/m  CONSTITUTIONAL: Well-developed, well-nourished female in no acute distress.  HEENT:  Normocephalic, atraumatic. External right and left ear normal. No scleral icterus.  NECK: Normal range of motion, supple, no masses noted on observation SKIN: No rash noted. Not diaphoretic. No erythema. No pallor. MUSCULOSKELETAL: Normal range of motion. No edema noted. NEUROLOGIC: Alert and oriented to person, place, and time. Normal muscle tone coordination. No cranial nerve deficit noted. PSYCHIATRIC: Normal mood and affect. Normal behavior. Normal judgment and thought content. CARDIOVASCULAR: Normal heart rate noted RESPIRATORY: Effort and breath sounds normal, no problems  with respiration noted ABDOMEN: Enlarged uterus palpated, mild tenderness on examination. No other overt distention noted.   PELVIC: Deferred  Labs and Imaging CBC Latest Ref Rng & Units 07/24/2018 05/13/2018 05/13/2018  WBC 3.4 - 10.8  x10E3/uL 5.4 - 5.9  Hemoglobin 11.1 - 15.9 g/dL 9.7(L) 12.2 10.2(L)  Hematocrit 34.0 - 46.6 % 30.5(L) 36.0 36.3  Platelets 150 - 450 x10E3/uL 265 - 261   US Pelvic Complete With Transvaginal  Result Date: 08/06/2018 CLINICAL DATA:  Enlarged uterus on exam.  LMP 07/17/2018. EXAM: TRANSABDOMINAL AND TRANSVAGINAL ULTRASOUND OF PELVIS TECHNIQUE: Both transabdominal and transvaginal ultrasound examinations of the pelvis were performed. Transabdominal technique was performed for global imaging of the pelvis including uterus, ovaries, adnexal regions, and pelvic cul-de-sac. It was necessary to proceed with endovaginal exam following the transabdominal exam to visualize the ovaries and adnexa. COMPARISON:  None FINDINGS: Uterus Measurements: 12.9 x 5.8 x 6.0 cm = volume: 231 mL. Diffusely heterogeneous echotexture of myometrium noted. Several small intramural fibroids are seen measuring up to 2.3 cm. Endometrium Thickness: 8 mm. Tri layered appearance. No focal abnormality visualized. Right ovary Measurements: 2.1 x 2.4 x 2.1 cm = volume: 5.5 mL. Normal appearance/no adnexal mass. Left ovary Measurements: 2.8 x 1.1 x 1.7 cm = volume: 2.6 mL. Normal appearance/no adnexal mass. Other findings No abnormal free fluid. IMPRESSION: Several small uterine fibroids, largest measuring 2.3 cm. Normal appearance of both ovaries.  No adnexal mass identified. Electronically Signed   By: Earle Gell M.D.   On: 08/06/2018 09:57     Assessment & Plan:      1. Fibroids 2. Adenomyosis 3. Abnormal uterine bleeding (AUB) Ultrasound showed heterogenous myometrial echotexture concerning for adenomyosis and fibroids; both conditions can result in AUB and pain. Discussed specific management modalities of fibroids, adenomyosis and AUB while keeping her co-morbidities in mind with patient including NSAIDs (Naproxen), oral progesterone, Depo Provera, Levonogestrel IUD, endometrial ablation or hysterectomy for surgical management.   Discussed risks and benefits of each method; emphasized that patient is not a good surgical candidate given her complex cardiac and neurologic history.  She insisted she wants a hysterectomy.  I recommended that she needs to let her cardiologist at [facility] refer her to the GYN service there; it is best that she get this done at a tertiary institution given her medical complexity; if the GYN service there agrees to do her procedure.  In the meantime, suggested Megace (oral progesterone therapy); emphasized that estrogen increases her risk for cerebrovascular/cardiovascular sequalae and progesterone does not have such an increased risk with the recommended dosages.  She wants to discuss this with her cardiologist prior to starting this therapy; has an appointment later this week.  Megace and Naproxen prescribed as needed for now for her symptoms,  Pain bleeding precautions reviewed.  Of note, patient was offered an endometrial biopsy today for further workup of her symptoms, she declined this due to pain.  Endometrial thickeness on ultrasound is 8 mm and homogenous, no overt concern for possible hyperplasia or neoplasia currently but that can never truly be ruled out without pathologic evaluation. She was told if AUB continues/worsens, this will continue to be strongly recommended. - naproxen (NAPROSYN) 500 MG tablet; Take 1 tablet (500 mg total) by mouth 2 (two) times daily with a meal. As needed for pain  Dispense: 60 tablet; Refill: 2 - megestrol (MEGACE) 40 MG tablet; Take 2 tablets (80 mg total) by mouth daily. Can increase to two tablets twice a day in  the event of heavy bleeding  Dispense: 60 tablet; Refill: 5   Routine preventative health maintenance measures emphasized. Please refer to After Visit Summary for other counseling recommendations.   Return in about 1 month (around 09/12/2018) for Followup.     Total face-to-face time with patient: 30 minutes.  Over 50% of encounter was spent on counseling  and coordination of care.   Verita Schneiders, MD, [place] for [employer], [place]

## 2018-08-14 NOTE — Patient Instructions (Signed)

## 2018-08-16 ENCOUNTER — Ambulatory Visit: Payer: Medicaid Other | Admitting: Obstetrics and Gynecology

## 2018-08-17 DIAGNOSIS — Q210 Ventricular septal defect: Secondary | ICD-10-CM | POA: Diagnosis not present

## 2018-08-17 DIAGNOSIS — I10 Essential (primary) hypertension: Secondary | ICD-10-CM | POA: Diagnosis not present

## 2018-08-30 DIAGNOSIS — Q210 Ventricular septal defect: Secondary | ICD-10-CM | POA: Diagnosis not present

## 2018-08-30 DIAGNOSIS — I498 Other specified cardiac arrhythmias: Secondary | ICD-10-CM | POA: Diagnosis not present

## 2018-08-30 DIAGNOSIS — I10 Essential (primary) hypertension: Secondary | ICD-10-CM | POA: Diagnosis not present

## 2018-09-07 DIAGNOSIS — Z01419 Encounter for gynecological examination (general) (routine) without abnormal findings: Secondary | ICD-10-CM | POA: Diagnosis not present

## 2018-09-07 DIAGNOSIS — N939 Abnormal uterine and vaginal bleeding, unspecified: Secondary | ICD-10-CM | POA: Diagnosis not present

## 2018-09-13 ENCOUNTER — Ambulatory Visit: Payer: Medicaid Other | Admitting: Obstetrics & Gynecology

## 2018-09-13 NOTE — Progress Notes (Deleted)
   Patient did not show up today for her scheduled appointment.   Keliyah Spillman, MD, FACOG Obstetrician & Gynecologist, [facility], [facility]  

## 2018-09-17 ENCOUNTER — Telehealth (INDEPENDENT_AMBULATORY_CARE_PROVIDER_SITE_OTHER): Payer: Self-pay

## 2018-09-17 ENCOUNTER — Telehealth: Payer: Self-pay | Admitting: Primary Care

## 2018-09-17 NOTE — Telephone Encounter (Signed)
Patient called complaining of headaches daily. She has been to pharmacies to buy something OTC for the headache, but states stores are out of everything. She would like an Rx for ibuprofen 800 mg sent to [facility] on [address]. Please advise. Nat Christen, CMA

## 2018-09-17 NOTE — Telephone Encounter (Signed)
After reviewing her notes no indications of tx for h/a unsure of underlying cause. Needs appt

## 2018-09-18 NOTE — Telephone Encounter (Signed)
Patient aware. Scheduled for 3/25 at 8:30. Nat Christen, CMA

## 2018-09-19 ENCOUNTER — Encounter (INDEPENDENT_AMBULATORY_CARE_PROVIDER_SITE_OTHER): Payer: Self-pay | Admitting: Primary Care

## 2018-09-19 ENCOUNTER — Other Ambulatory Visit: Payer: Self-pay

## 2018-09-19 ENCOUNTER — Ambulatory Visit (INDEPENDENT_AMBULATORY_CARE_PROVIDER_SITE_OTHER): Payer: Medicaid Other | Admitting: Primary Care

## 2018-09-19 VITALS — BP 128/74 | HR 87 | Temp 98.4°F | Ht 59.0 in | Wt 164.2 lb

## 2018-09-19 DIAGNOSIS — I10 Essential (primary) hypertension: Secondary | ICD-10-CM

## 2018-09-19 DIAGNOSIS — J4520 Mild intermittent asthma, uncomplicated: Secondary | ICD-10-CM | POA: Diagnosis not present

## 2018-09-19 DIAGNOSIS — R21 Rash and other nonspecific skin eruption: Secondary | ICD-10-CM

## 2018-09-19 DIAGNOSIS — E119 Type 2 diabetes mellitus without complications: Secondary | ICD-10-CM

## 2018-09-19 DIAGNOSIS — G43101 Migraine with aura, not intractable, with status migrainosus: Secondary | ICD-10-CM

## 2018-09-19 DIAGNOSIS — I270 Primary pulmonary hypertension: Secondary | ICD-10-CM

## 2018-09-19 DIAGNOSIS — Z2821 Immunization not carried out because of patient refusal: Secondary | ICD-10-CM

## 2018-09-19 MED ORDER — JANUMET 50-1000 MG PO TABS: 1.0000 | ORAL_TABLET | Freq: Two times a day (BID) | ORAL | 5 refills | Status: DC

## 2018-09-19 MED ORDER — ACCU-CHEK GUIDE ME W/DEVICE KIT: 1.0000 | PACK | Freq: Once | 0 refills | Status: AC

## 2018-09-19 MED ORDER — AMLODIPINE BESYLATE 5 MG PO TABS: 5.0000 mg | ORAL_TABLET | Freq: Every day | ORAL | 1 refills | Status: DC

## 2018-09-19 MED ORDER — IBUPROFEN 800 MG PO TABS: 600.0000 mg | ORAL_TABLET | Freq: Three times a day (TID) | ORAL | 3 refills | Status: AC | PRN

## 2018-09-19 MED ORDER — ACCU-CHEK FASTCLIX LANCETS MISC: 1 refills | Status: DC

## 2018-09-19 MED ORDER — TRIAMCINOLONE ACETONIDE 0.1 % EX CREA: 1.0000 "application " | TOPICAL_CREAM | Freq: Two times a day (BID) | CUTANEOUS | 0 refills | Status: DC

## 2018-09-19 NOTE — Progress Notes (Signed)
Acute Visit  Subjective:  [name], female    DOB: Sep 09, 1970  Age: 48 y.o. MRN: [id number]  CC:  Chief Complaint  Patient presents with  . Headache    for the last week     HPI Mackenzie Patterson presents for an acute visit c/o migraines she states she wakes up with a migraine every morning. Past medical history of type 2 diabetes, essential hypertension, hyperlipidemia,  and patient with a history of ventral septal defect followed at [facility], pulmonary hypertension/pulmonary arterial hypertension   Past Medical History:  Diagnosis Date  . Asthma   . Breast discharge 01/10/2017  . Congenital ventricular septal defect   . Diabetes mellitus without complication ([place])   . Heart murmur   . Hypertension   . Obesity   . Pulmonary hypertension (HCC)    mild   . Tricuspid regurgitation   . VSD (ventricular septal defect), perimembranous    Qp:Qs 1.7:1 by catheterization     Past Surgical History:  Procedure Laterality Date  . CARDIAC CATHETERIZATION [name] 10/30/2014   Procedure: Right Heart Cath;  Surgeon: [name], MD;  Location: [facility] INVASIVE CV LAB CUPID;  Service: Cardiovascular;  Laterality: [name];  . CHOLECYSTECTOMY  2018  . CYST EXCISION    . LEFT AND RIGHT HEART CATHETERIZATION WITH CORONARY ANGIOGRAM [name] 09/11/2013   Procedure: LEFT AND RIGHT HEART CATHETERIZATION WITH CORONARY ANGIOGRAM;  Surgeon: [name], MD;  Location: [facility] CATH LAB;  Service: Cardiovascular;  Laterality: [name];  . RIGHT HEART CATH [name] 09/02/2016   Procedure: Right Heart Cath;  Surgeon: [name], MD;  Location: [place] CV LAB;  Service: Cardiovascular;  Laterality: [name];  . TUBAL LIGATION      Family History  Problem Relation Age of Onset  . Cancer Other   . Hyperlipidemia Other   . Hypertension Other   . Asthma Other   . Diabetes Mother   . Diabetes Father     Social History   Socioeconomic History  . Marital status: Single    Spouse name: Not on file  . Number of  children: Not on file  . Years of education: Not on file  . Highest education level: Not on file  Occupational History  . Not on file  Social Needs  . Financial resource strain: Not on file  . Food insecurity:    Worry: Not on file    Inability: Not on file  . Transportation needs:    Medical: Not on file    Non-medical: Not on file  Tobacco Use  . Smoking status: Never Smoker  . Smokeless tobacco: Never Used  Substance and Sexual Activity  . Alcohol use: No  . Drug use: No  . Sexual activity: Yes    Birth control/protection: Surgical  Lifestyle  . Physical activity:    Days per week: Not on file    Minutes per session: Not on file  . Stress: Not on file  Relationships  . Social connections:    Talks on phone: Not on file    Gets together: Not on file    Attends religious service: Not on file    Active member of club or organization: Not on file    Attends meetings of clubs or organizations: Not on file    Relationship status: Not on file  . Intimate partner violence:    Fear of current or ex partner: Not on file    Emotionally abused: Not on file  Physically abused: Not on file    Forced sexual activity: Not on file  Other Topics Concern  . Not on file  Social History Narrative   Lives with boyfriend. On disability.     Outpatient Medications Prior to Visit  Medication Sig Dispense Refill  . albuterol (PROVENTIL HFA;VENTOLIN HFA) 108 (90 Base) MCG/ACT inhaler Inhale 2 puffs into the lungs every 6 (six) hours as needed for wheezing or shortness of breath. 1 Inhaler 5  . aspirin EC 81 MG tablet Take 1 tablet (81 mg total) by mouth daily. 90 tablet 3  . atorvastatin (LIPITOR) 20 MG tablet Take 1 tablet (20 mg total) by mouth daily. 90 tablet 3  . budesonide-formoterol (SYMBICORT) 160-4.5 MCG/ACT inhaler Inhale 2 puffs into the lungs 2 (two) times daily. 1 Inhaler 5  . carvedilol (COREG) 6.25 MG tablet Take 1 tablet (6.25 mg total) by mouth 2 (two) times daily with a  meal. 60 tablet 11  . cetirizine (ZYRTEC) 10 MG tablet Take 1 tablet (10 mg total) by mouth daily. At bedtime as needed 30 tablet 11  . docusate sodium (COLACE) 100 MG capsule Take 1 capsule (100 mg total) by mouth at bedtime as needed for mild constipation. 30 capsule 3  . ferrous sulfate 325 (65 FE) MG tablet Take 1 tablet (325 mg total) by mouth 2 (two) times daily with a meal. To treat anemia 60 tablet 2  . glucose blood (ACCU-CHEK GUIDE) test strip USE 2 TIMES DAILY AS DIRECFTED 70 each 5  . lisinopril (PRINIVIL,ZESTRIL) 20 MG tablet Take 1 tablet (20 mg total) by mouth daily. 90 tablet 3  . megestrol (MEGACE) 40 MG tablet Take 2 tablets (80 mg total) by mouth daily. Can increase to two tablets twice a day in the event of heavy bleeding 60 tablet 5  . pantoprazole (PROTONIX) 20 MG tablet Take 1 tablet (20 mg total) by mouth daily. For stomach protection 30 tablet 11  . ACCU-CHEK FASTCLIX LANCETS MISC USE 2 TIMES DAILY AS DIRECTED 102 each 2  . amLODipine (NORVASC) 5 MG tablet Take 1 tablet (5 mg total) by mouth daily. 90 tablet 1  . Blood Glucose Monitoring Suppl (ACCU-CHEK AVIVA PLUS) w/Device KIT 1 kit by Does not apply route daily. 1 kit 0  . clotrimazole-betamethasone (LOTRISONE) cream Apply 1 application topically 2 (two) times daily. To areas of rash/itchy skin 30 g 5  . JANUMET 50-1000 MG tablet Take 1 tablet by mouth 2 (two) times daily with a meal. 60 tablet 5  . naproxen (NAPROSYN) 500 MG tablet Take 1 tablet (500 mg total) by mouth 2 (two) times daily with a meal. As needed for pain 60 tablet 2   No facility-administered medications prior to visit.     Allergies  Allergen Reactions  . Penicillins Anaphylaxis    Has patient had a PCN reaction causing immediate rash, facial/tongue/throat swelling, SOB or lightheadedness with hypotension: Yes Has patient had a PCN reaction causing severe rash involving mucus membranes or skin necrosis: Yes Has patient had a PCN reaction that  required hospitalization Yes- went to ED, was not admitted Has patient had a PCN reaction occurring within the last 10 years: No- longer then 10 years If all of the above answers are "NO", then may proceed with Cephalosporin use.   . Tape Rash    Other reaction(s): Other (See Comments) Burns skin  . Other Rash    Other reaction(s): Other (See Comments) **EKG GEL PADS**  "Burns my skin" Pt  states she is allergic to a blood pressure medication, unsure.  **EKG GEL PADS**  "Burns my skin"  . Latex Rash    ROS Review of Systems  Constitutional: Positive for fatigue.  HENT:       Head ache (migraines with aura of nausea )  Eyes: Negative.   Respiratory: Negative.   Cardiovascular: Negative.   Gastrointestinal: Negative.   Endocrine: Negative.   Genitourinary: Negative.   Musculoskeletal: Negative.   Skin: Negative.   Allergic/Immunologic: Negative.   Neurological: Negative.   Hematological: Negative.   Psychiatric/Behavioral: Negative.       Objective:    Physical Exam  Constitutional: She is oriented to person, place, and time. She appears well-developed and well-nourished.  HENT:  Head: Normocephalic and atraumatic.  Eyes: Pupils are equal, round, and reactive to light. EOM are normal.  Neck: Normal range of motion.  Cardiovascular: Normal rate and regular rhythm.  Pulmonary/Chest: Effort normal and breath sounds normal.  Abdominal: Soft. Bowel sounds are normal.  Musculoskeletal: Normal range of motion.  Neurological: She is alert and oriented to person, place, and time.  Skin: Skin is warm.  Psychiatric: She has a normal mood and affect.    BP 128/74 (BP Location: Left Arm, Patient Position: Sitting, Cuff Size: Normal)   Pulse 87   Temp 98.4 F (36.9 C) (Oral)   Ht _0  (1.499 m)   Wt 164 lb 3.2 oz (74.5 kg)   LMP 09/15/2018 (Exact Date) Comment: pt has had a menstrual twice in marcb  SpO2 99%   BMI 33.16 kg/m  Wt Readings from Last 3 Encounters:   09/19/18 164 lb 3.2 oz (74.5 kg)  08/14/18 165 lb (74.8 kg)  07/24/18 164 lb 3.2 oz (74.5 kg)     Health Maintenance Due  Topic Date Due  . PNEUMOCOCCAL POLYSACCHARIDE VACCINE AGE 83-64 HIGH RISK  02/10/1974  . OPHTHALMOLOGY EXAM  02/10/1982    There are no preventive care reminders to display for this patient.  Lab Results  Component Value Date   TSH 2.180 11/14/2016   Lab Results  Component Value Date   WBC 5.4 07/24/2018   HGB 9.7 (L) 07/24/2018   HCT 30.5 (L) 07/24/2018   MCV 72 (L) 07/24/2018   PLT 265 07/24/2018   Lab Results  Component Value Date   NA 138 07/24/2018   K 4.0 07/24/2018   CO2 21 07/24/2018   GLUCOSE 96 07/24/2018   BUN 10 07/24/2018   CREATININE 0.89 07/24/2018   BILITOT 0.3 07/24/2018   ALKPHOS 83 07/24/2018   AST 22 07/24/2018   ALT 20 07/24/2018   PROT 7.6 07/24/2018   ALBUMIN 4.5 07/24/2018   CALCIUM 9.1 07/24/2018   ANIONGAP 7 05/13/2018   GFR 105.36 10/27/2014   Lab Results  Component Value Date   CHOL 159 05/14/2018   Lab Results  Component Value Date   HDL 47 05/14/2018   Lab Results  Component Value Date   LDLCALC 92 05/14/2018   Lab Results  Component Value Date   TRIG 102 05/14/2018   Lab Results  Component Value Date   CHOLHDL 3.4 05/14/2018   Lab Results  Component Value Date   HGBA1C 6.1 (H) 05/14/2018  Mackenzie Patterson was seen today for headache.  Diagnoses and all orders for this visit:  Essential hypertension Well controlled  -     amLODipine (NORVASC) 5 MG tablet; Take 1 tablet (5 mg total) by mouth daily.  Mild intermittent asthma without complication Controlled on  proventil HFA, and Symbicort)  Migraine with aura and with status migrainosus, not intractable -     ibuprofen (ADVIL,MOTRIN) 800 MG tablet; Take 1 tablet (800 mg total) by mouth every 8 (eight) hours as needed for up to 30 days for headache.  Controlled type 2 diabetes mellitus without complication, without long-term current use of insulin  (HCC) -     JANUMET 50-1000 MG tablet; Take 1 tablet by mouth 2 (two) times daily with a meal. -     POCT Glucose (Device for Home Use)  Type 2 diabetes mellitus without complication, without long-term current use of insulin (HCC) -     Accu-Chek FastClix Lancets MISC; USE 2 TIMES DAILY AS DIRECTED  Primary pulmonary hypertension (HCC)  Followed by [facility] for ventral septal defect   Refused pneumococcal vaccine  Other orders -     triamcinolone cream (KENALOG) 0.1 %; Apply 1 application topically 2 (two) times daily. -     Blood Glucose Monitoring Suppl (ACCU-CHEK GUIDE ME) w/Device KIT; 1 Bag by Does not apply route once for 1 dose.      Assessment & Plan:   Problem List Items Addressed This Visit    Essential hypertension - Primary (Chronic)   Unremarkable cont    amLODipine (NORVASC) 5 MG tablet   Diabetes mellitus (HCC) (Chronic)   A1C 11/19  6.1 Informed patients she did not need to check CBG BID  Relevant Medications   JANUMET 50-1000 MG tablet   Accu-Chek FastClix Lancets MISC   Asthma (Chronic)    Other Visit Diagnoses                                 Meds ordered this encounter  Medications  . ibuprofen (ADVIL,MOTRIN) 800 MG tablet    Sig: Take 1 tablet (800 mg total) by mouth every 8 (eight) hours as needed for up to 30 days for headache.    Dispense:  90 tablet    Refill:  3  . JANUMET 50-1000 MG tablet    Sig: Take 1 tablet by mouth 2 (two) times daily with a meal.    Dispense:  60 tablet    Refill:  5  . amLODipine (NORVASC) 5 MG tablet    Sig: Take 1 tablet (5 mg total) by mouth daily.    Dispense:  90 tablet    Refill:  1  . triamcinolone cream (KENALOG) 0.1 %    Sig: Apply 1 application topically 2 (two) times daily.    Dispense:  30 g    Refill:  0  . Blood Glucose Monitoring Suppl (ACCU-CHEK GUIDE ME) w/Device KIT    Sig: 1 Bag by Does not apply route once for 1 dose.    Dispense:  1 kit    Refill:  0  . Accu-Chek  FastClix Lancets MISC    Sig: USE 2 TIMES DAILY AS DIRECTED    Dispense:  100 each    Refill:  1    ICD10 - E11.9    Follow-up: Return in about 6 months (around 03/22/2019) for Head aches, DM, HTN.    Kerin Perna, NP

## 2018-09-19 NOTE — Patient Instructions (Signed)

## 2018-09-23 NOTE — Progress Notes (Signed)
Subjective:    [name], female    DOB: 1970-07-23, 48 y.o.   MRN: [id number]  HPI      48 yo female who is seen in follow-up of chronic medical issues.  Patient with history of type 2 diabetes, essential hypertension, hyperlipidemia, morbid obesity and patient with a history of ventral septal defect followed at [facility], pulmonary hypertension/pulmonary arterial hypertension who is status post hospital admission 05/13/2017 through 05/14/2018 secondary to presenting to the emergency department with complaint of slurred speech, right-sided facial droop and left upper extremity weakness which had also occurred approximately 8 months prior.  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  Patient had EEG which was negative for any acute seizure activity.  Patient was diagnosed with TIA and was started on aspirin and Plavix for 3 weeks and then Plavix could be discontinued and continue aspirin.           Patient reports that she feels well at today's visit.  Patient continues to take Janumet for her diabetes.  Patient reports no increased thirst, no urinary frequency and no blurred vision.  Patient has had no further TIA symptoms and no focal numbness or weakness.  Patient denies any unusual bruising or bleeding related to her use of aspirin.  Patient does feel as if she has had some nausea at times since being on aspirin therapy.  Patient is currently on an iron supplement due to anemia and is taking a stool softener to help prevent constipation.      She also continues to take her blood pressure medication.  She denies any issues with cough related to her use of lisinopril.  No increased peripheral edema with the use of amlodipine.  She has had no headache or dizziness related to her blood pressure.  She also has history of asthma but feels that this is stable at this time.  She denies any recent shortness of breath, cough or wheezing.  Patient has had  no limitation of activity secondary to asthma concerns.  Patient has had no nighttime awakening due to shortness of breath, cough or wheezing.  Patient denies any chest tightness.  She does have some nasal congestion and occasional postnasal drainage related to allergy symptoms.  Patient also tends to get areas of dry, slightly itchy skin which she tends to notice more in the winter.        Past Medical History:  Diagnosis Date  . Asthma   . Breast discharge 01/10/2017  . Congenital ventricular septal defect   . Diabetes mellitus without complication ([place])   . Heart murmur   . Hypertension   . Obesity   . Pulmonary hypertension (HCC)    mild   . Tricuspid regurgitation   . VSD (ventricular septal defect), perimembranous    Qp:Qs 1.7:1 by catheterization    Past Surgical History:  Procedure Laterality Date  . CARDIAC CATHETERIZATION [name] 10/30/2014   Procedure: Right Heart Cath;  Surgeon: [name], MD;  Location: [facility] INVASIVE CV LAB CUPID;  Service: Cardiovascular;  Laterality: [name];  . CHOLECYSTECTOMY  2018  . CYST EXCISION    . LEFT AND RIGHT HEART CATHETERIZATION WITH CORONARY ANGIOGRAM [name] 09/11/2013   Procedure: LEFT AND RIGHT HEART CATHETERIZATION WITH CORONARY ANGIOGRAM;  Surgeon: [name], MD;  Location: [facility] CATH LAB;  Service: Cardiovascular;  Laterality: [name];  . RIGHT HEART CATH [name] 09/02/2016   Procedure: Right Heart Cath;  Surgeon:  [name], MD;  Location: [place] CV LAB;  Service: Cardiovascular;  Laterality: [name];  . TUBAL LIGATION     Family History  Problem Relation Age of Onset  . Cancer Other   . Hyperlipidemia Other   . Hypertension Other   . Asthma Other   . Diabetes Mother   . Diabetes Father    Social History   Tobacco Use  . Smoking status: Never Smoker  . Smokeless tobacco: Never Used  Substance Use Topics  . Alcohol use: No  . Drug use: No     Review of Systems  Constitutional: Negative for chills, fatigue and fever.  HENT:  Positive for congestion and postnasal drip. Negative for ear pain, sore throat and trouble swallowing.   Eyes: Negative for photophobia and visual disturbance.  Respiratory: Negative for cough, chest tightness, shortness of breath and wheezing.   Cardiovascular: Negative for chest pain, palpitations and leg swelling.  Gastrointestinal: Positive for nausea. Negative for abdominal pain, blood in stool, constipation, diarrhea and vomiting.  Endocrine: Negative for polydipsia, polyphagia and polyuria.  Genitourinary: Negative for dysuria and frequency.  Musculoskeletal: Negative for arthralgias, back pain and myalgias.  Skin: Positive for rash. Negative for wound.  Neurological: Negative for dizziness and headaches.  Hematological: Negative for adenopathy. Does not bruise/bleed easily.       Objective:   Physical Exam Constitutional:      Appearance: Normal appearance.  HENT:     Head: Normocephalic and atraumatic.     Right Ear: Tympanic membrane, ear canal and external ear normal.     Left Ear: Tympanic membrane, ear canal and external ear normal.     Nose: Congestion and rhinorrhea (Mild, clear nasal drainage) present.     Mouth/Throat:     Mouth: Mucous membranes are moist.     Pharynx: Oropharynx is clear. Posterior oropharyngeal erythema (Mild posterior pharynx erythema) present.  Eyes:     Extraocular Movements: Extraocular movements intact.     Conjunctiva/sclera: Conjunctivae normal.     Pupils: Pupils are equal, round, and reactive to light.  Neck:     Musculoskeletal: Normal range of motion and neck supple.  Cardiovascular:     Rate and Rhythm: Normal rate and regular rhythm.  Pulmonary:     Effort: Pulmonary effort is normal. No respiratory distress.     Breath sounds: Normal breath sounds. No wheezing.  Abdominal:     General: There is no distension.     Palpations: Abdomen is soft.     Tenderness: There is no abdominal tenderness. There is no right CVA tenderness,  left CVA tenderness, guarding or rebound.  Musculoskeletal:        General: No swelling or tenderness.     Right lower leg: No edema.     Left lower leg: No edema.  Lymphadenopathy:     Cervical: No cervical adenopathy.  Skin:    General: Skin is dry.     Findings: Rash present.     Comments: Areas of dry hyperpigmented skin areas on the arms/neck; no active skin breakdown on the feet  Neurological:     General: No focal deficit present.     Mental Status: She is alert and oriented to person, place, and time.  Psychiatric:        Mood and Affect: Mood normal.        Behavior: Behavior normal.        Thought Content: Thought content normal.  Judgment: Judgment normal.    BP 130/80 (BP Location: Left Arm, Patient Position: Sitting, Cuff Size: Normal)   Pulse 89   Temp (!) 97.5 F (36.4 C) (Oral)   Ht 4\' 11"  (1.499 m)   Wt 164 lb 3.2 oz (74.5 kg)   LMP 07/17/2018 (Exact Date)   SpO2 98%   BMI 33.16 kg/m         Assessment & Plan:  1. Controlled type 2 diabetes mellitus without complication, without long-term current use of insulin ([place]) Most recent hemoglobin A1c in November showed that her blood sugar was well controlled with A1c of 6.1.  Patient will continue the use of Janumet, low carbohydrate diet and low impact cardiovascular exercise several times per week.  Patient will have CMP in follow-up of her diabetes and medication use at today's visit.  Notes from patient's recent hospitalization were reviewed at today's visit and discussed with the patient. - Comprehensive metabolic panel  2. Essential hypertension Blood pressure is at goal of 130/90 and patient will continue use of lisinopril, carvedilol and amlodipine.  DASH diet encouraged - lisinopril (PRINIVIL,ZESTRIL) 20 MG tablet; Take 1 tablet (20 mg total) by mouth daily.  Dispense: 90 tablet; Refill: 3 - carvedilol (COREG) 6.25 MG tablet; Take 1 tablet (6.25 mg total) by mouth 2 (two) times daily with a meal.   Dispense: 60 tablet; Refill: 11  3. Mild intermittent asthma without complication Patient with mild intermittent asthma which is controlled with her current medications.  Refills provided of Symbicort as well as albuterol to use as needed - budesonide-formoterol (SYMBICORT) 160-4.5 MCG/ACT inhaler; Inhale 2 puffs into the lungs 2 (two) times daily.  Dispense: 1 Inhaler; Refill: 5 - albuterol (PROVENTIL HFA;VENTOLIN HFA) 108 (90 Base) MCG/ACT inhaler; Inhale 2 puffs into the lungs every 6 (six) hours as needed for wheezing or shortness of breath.  Dispense: 1 Inhaler; Refill: 5  4. Hypertriglyceridemia Patient with lipid panel done in November 2019 with triglycerides that are now normal however patient's LDL remains above goal of 70 or less as her most recent LDL was 92.  Patient will continue the use of atorvastatin as well as a low-fat diet and regular exercise is also encouraged. - atorvastatin (LIPITOR) 20 MG tablet; Take 1 tablet (20 mg total) by mouth daily.  Dispense: 90 tablet; Refill: 3  5. History of TIA (transient ischemic attack) Status post hospitalization 05/13/2018 through 05/14/2018 due to TIA.  Patient's hospital notes, imaging and labs were reviewed at today's visit as well as discussed with the patient.  Patient will continue the use of aspirin 81 mg after completing 3 weeks of Plavix therapy and follow-up with neurology.  MRI with evidence of old infarct but no acute infarct seen on MRI done during hospitalization.  Patient does have history of VSD for which she is followed at [facility]. - aspirin EC 81 MG tablet; Take 1 tablet (81 mg total) by mouth daily.  Dispense: 90 tablet; Refill: 3  6. Nausea Patient with complaint of nausea status post aspirin therapy for TIA/history of CVA.  Patient has been asked to start the use of pantoprazole and will check CMP to look for electrolyte or liver abnormality.  Patient has been made aware that if she has any blood in the stool or black stools  she needs to seek medical attention.  Patient should also follow-up of her nausea has not improved within the next 4 weeks, sooner if symptoms worsen - Comprehensive metabolic panel - pantoprazole (PROTONIX) 20  MG tablet; Take 1 tablet (20 mg total) by mouth daily. For stomach protection  Dispense: 30 tablet; Refill: 11  7. Encounter for long-term (current) use of medications Patient will have CMP done at today's visit in follow-up of her use of medications for treatment of diabetes and hyperlipidemia which can cause electrolyte abnormalities, abnormalities in renal function and liver function  8. Long term (current) use of aspirin CBC in follow-up of long-term use of aspirin and prior anemia.  Patient will also be placed on pantoprazole for stomach protection - CBC with Differential - pantoprazole (PROTONIX) 20 MG tablet; Take 1 tablet (20 mg total) by mouth daily. For stomach protection  Dispense: 30 tablet; Refill: 11  9. Iron deficiency anemia, unspecified iron deficiency anemia type She is currently on ferrous sulfate and CBC will be repeated at today's visit.  Hemoglobin during hospitalization on 05/13/2018 was 10.2 with low MCV of 75.3 - CBC with Differential  10. Ventricular septal defect (VSD), membranous Patient with history of VSD which increases her risk of stroke.  Patient will continue daily aspirin therapy and follow-up with her cardiologist at [place]. Allergic rhinitis, unspecified seasonality, unspecified trigger Prescription given for generic Zyrtec for treatment of allergic rhinitis - cetirizine (ZYRTEC) 10 MG tablet; Take 1 tablet (10 mg total) by mouth daily. At bedtime as needed  Dispense: 30 tablet; Refill: 11  12. Dermatitis Discussed with patient that she likely has atopic dermatitis/eczema related to her asthma/allergic rhinitis.  Patient encouraged to use over-the-counter low-dose topical steroid cream such as hydrocortisone as needed and to keep skin moisturized.   Use lukewarm water with shower/bathing and avoid excessively hot water which will further dry out the skin.  Marland KitchenAn After Visit Summary was printed and given to the patient.    Return in about 4 months (around 11/22/2018) for DM.

## 2018-10-05 ENCOUNTER — Encounter (INDEPENDENT_AMBULATORY_CARE_PROVIDER_SITE_OTHER): Payer: Self-pay | Admitting: Family Medicine

## 2018-10-05 NOTE — Progress Notes (Signed)
Subjective:    [name], female    DOB: 09/02/1970, 48 y.o.   MRN: [id number]  Medication Refill  Associated symptoms include congestion, nausea and a rash. Pertinent negatives include no abdominal pain, arthralgias, chest pain, chills, coughing, fatigue, fever, headaches, myalgias, sore throat or vomiting.        48 yo female who is seen in follow-up of chronic medical issues.  Patient with history of type 2 diabetes, essential hypertension, hyperlipidemia, morbid obesity and patient with a history of ventral septal defect followed at [facility], pulmonary hypertension/pulmonary arterial hypertension who is status post hospital admission 05/13/2017 through 05/14/2018 secondary to presenting to the emergency department with complaint of slurred speech, right-sided facial droop and left upper extremity weakness which had also occurred approximately 8 months prior.  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  Patient had EEG which was negative for any acute seizure activity.  Patient was diagnosed with TIA and was started on aspirin and Plavix for 3 weeks and then Plavix could be discontinued and continue aspirin.           Patient reports that she feels well at today's visit.  Patient continues to take Janumet for her diabetes.  Patient reports no increased thirst, no urinary frequency and no blurred vision.  Patient has had no further TIA symptoms and no focal numbness or weakness.  Patient denies any unusual bruising or bleeding related to her use of aspirin.  Patient does feel as if she has had some nausea at times since being on aspirin therapy.  Patient is currently on an iron supplement due to anemia and is taking a stool softener to help prevent constipation.      She also continues to take her blood pressure medication.  She denies any issues with cough related to her use of lisinopril.  No increased peripheral edema with the use of  amlodipine.  She has had no headache or dizziness related to her blood pressure.  She also has history of asthma but feels that this is stable at this time.  She denies any recent shortness of breath, cough or wheezing.  Patient has had no limitation of activity secondary to asthma concerns.  Patient has had no nighttime awakening due to shortness of breath, cough or wheezing.  Patient denies any chest tightness.  She does have some nasal congestion and occasional postnasal drainage related to allergy symptoms.  Patient also tends to get areas of dry, slightly itchy skin which she tends to notice more in the winter.        Past Medical History:  Diagnosis Date  . Asthma   . Breast discharge 01/10/2017  . Congenital ventricular septal defect   . Diabetes mellitus without complication ([place])   . Heart murmur   . Hypertension   . Obesity   . Pulmonary hypertension (HCC)    mild   . Tricuspid regurgitation   . VSD (ventricular septal defect), perimembranous    Qp:Qs 1.7:1 by catheterization    Past Surgical History:  Procedure Laterality Date  . CARDIAC CATHETERIZATION [name] 10/30/2014   Procedure: Right Heart Cath;  Surgeon: [name], MD;  Location: [facility] INVASIVE CV LAB CUPID;  Service: Cardiovascular;  Laterality: [name];  . CHOLECYSTECTOMY  2018  . CYST EXCISION    . LEFT AND RIGHT HEART CATHETERIZATION WITH CORONARY ANGIOGRAM [name] 09/11/2013   Procedure: LEFT AND RIGHT HEART CATHETERIZATION WITH CORONARY ANGIOGRAM;  Surgeon: [name], MD;  Location: [facility] CATH LAB;  Service: Cardiovascular;  Laterality: [name];  . RIGHT HEART CATH [name] 09/02/2016   Procedure: Right Heart Cath;  Surgeon: [name], MD;  Location: [place] CV LAB;  Service: Cardiovascular;  Laterality: [name];  . TUBAL LIGATION     Family History  Problem Relation Age of Onset  . Cancer Other   . Hyperlipidemia Other   . Hypertension Other   . Asthma Other   . Diabetes Mother   . Diabetes Father    Social History    Tobacco Use  . Smoking status: Never Smoker  . Smokeless tobacco: Never Used  Substance Use Topics  . Alcohol use: No  . Drug use: No     Review of Systems  Constitutional: Negative for chills, fatigue and fever.  HENT: Positive for congestion and postnasal drip. Negative for ear pain, sore throat and trouble swallowing.   Eyes: Negative for photophobia and visual disturbance.  Respiratory: Negative for cough, chest tightness, shortness of breath and wheezing.   Cardiovascular: Negative for chest pain, palpitations and leg swelling.  Gastrointestinal: Positive for nausea. Negative for abdominal pain, blood in stool, constipation, diarrhea and vomiting.  Endocrine: Negative for polydipsia, polyphagia and polyuria.  Genitourinary: Negative for dysuria and frequency.  Musculoskeletal: Negative for arthralgias, back pain and myalgias.  Skin: Positive for rash. Negative for wound.  Neurological: Negative for dizziness and headaches.  Hematological: Negative for adenopathy. Does not bruise/bleed easily.       Objective:   Physical Exam Constitutional:      Appearance: Normal appearance.  HENT:     Head: Normocephalic and atraumatic.     Right Ear: Tympanic membrane, ear canal and external ear normal.     Left Ear: Tympanic membrane, ear canal and external ear normal.     Nose: Congestion and rhinorrhea (Mild, clear nasal drainage) present.     Mouth/Throat:     Mouth: Mucous membranes are moist.     Pharynx: Oropharynx is clear. Posterior oropharyngeal erythema (Mild posterior pharynx erythema) present.  Eyes:     Extraocular Movements: Extraocular movements intact.     Conjunctiva/sclera: Conjunctivae normal.     Pupils: Pupils are equal, round, and reactive to light.  Neck:     Musculoskeletal: Normal range of motion and neck supple.  Cardiovascular:     Rate and Rhythm: Normal rate and regular rhythm.  Pulmonary:     Effort: Pulmonary effort is normal. No respiratory  distress.     Breath sounds: Normal breath sounds. No wheezing.  Abdominal:     General: There is no distension.     Palpations: Abdomen is soft.     Tenderness: There is no abdominal tenderness. There is no right CVA tenderness, left CVA tenderness, guarding or rebound.  Musculoskeletal:        General: No swelling or tenderness.     Right lower leg: No edema.     Left lower leg: No edema.  Lymphadenopathy:     Cervical: No cervical adenopathy.  Skin:    General: Skin is dry.     Findings: Rash present.     Comments: Areas of dry hyperpigmented skin areas on the arms/neck; no active skin breakdown on the feet  Neurological:     General: No focal deficit present.     Mental Status: She is alert and oriented to person, place, and time.  Psychiatric:        Mood and  Affect: Mood normal.        Behavior: Behavior normal.        Thought Content: Thought content normal.        Judgment: Judgment normal.    BP 130/80 (BP Location: Left Arm, Patient Position: Sitting, Cuff Size: Normal)   Pulse 89   Temp (!) 97.5 F (36.4 C) (Oral)   Ht 4\' 11"  (1.499 m)   Wt 164 lb 3.2 oz (74.5 kg)   LMP 07/17/2018 (Exact Date)   SpO2 98%   BMI 33.16 kg/m         Assessment & Plan:  1. Controlled type 2 diabetes mellitus without complication, without long-term current use of insulin ([place]) Most recent hemoglobin A1c in November showed that her blood sugar was well controlled with A1c of 6.1.  Patient will continue the use of Janumet, low carbohydrate diet and low impact cardiovascular exercise several times per week.  Patient will have CMP in follow-up of her diabetes and medication use at today's visit.  Notes from patient's recent hospitalization were reviewed at today's visit and discussed with the patient. - Comprehensive metabolic panel  2. Essential hypertension Blood pressure is at goal of 130/90 and patient will continue use of lisinopril, carvedilol and amlodipine.  DASH diet  encouraged - lisinopril (PRINIVIL,ZESTRIL) 20 MG tablet; Take 1 tablet (20 mg total) by mouth daily.  Dispense: 90 tablet; Refill: 3 - carvedilol (COREG) 6.25 MG tablet; Take 1 tablet (6.25 mg total) by mouth 2 (two) times daily with a meal.  Dispense: 60 tablet; Refill: 11  3. Mild intermittent asthma without complication Patient with mild intermittent asthma which is controlled with her current medications.  Refills provided of Symbicort as well as albuterol to use as needed - budesonide-formoterol (SYMBICORT) 160-4.5 MCG/ACT inhaler; Inhale 2 puffs into the lungs 2 (two) times daily.  Dispense: 1 Inhaler; Refill: 5 - albuterol (PROVENTIL HFA;VENTOLIN HFA) 108 (90 Base) MCG/ACT inhaler; Inhale 2 puffs into the lungs every 6 (six) hours as needed for wheezing or shortness of breath.  Dispense: 1 Inhaler; Refill: 5  4. Hypertriglyceridemia Patient with lipid panel done in November 2019 with triglycerides that are now normal however patient's LDL remains above goal of 70 or less as her most recent LDL was 92.  Patient will continue the use of atorvastatin as well as a low-fat diet and regular exercise is also encouraged. - atorvastatin (LIPITOR) 20 MG tablet; Take 1 tablet (20 mg total) by mouth daily.  Dispense: 90 tablet; Refill: 3  5. History of TIA (transient ischemic attack) Status post hospitalization 05/13/2018 through 05/14/2018 due to TIA.  Patient's hospital notes, imaging and labs were reviewed at today's visit as well as discussed with the patient.  Patient will continue the use of aspirin 81 mg after completing 3 weeks of Plavix therapy and follow-up with neurology.  MRI with evidence of old infarct but no acute infarct seen on MRI done during hospitalization.  Patient does have history of VSD for which she is followed at [facility]. - aspirin EC 81 MG tablet; Take 1 tablet (81 mg total) by mouth daily.  Dispense: 90 tablet; Refill: 3  6. Nausea Patient with complaint of nausea status post  aspirin therapy for TIA/history of CVA.  Patient has been asked to start the use of pantoprazole and will check CMP to look for electrolyte or liver abnormality.  Patient has been made aware that if she has any blood in the stool or black stools she needs  to seek medical attention.  Patient should also follow-up of her nausea has not improved within the next 4 weeks, sooner if symptoms worsen - Comprehensive metabolic panel - pantoprazole (PROTONIX) 20 MG tablet; Take 1 tablet (20 mg total) by mouth daily. For stomach protection  Dispense: 30 tablet; Refill: 11  7. Encounter for long-term (current) use of medications Patient will have CMP done at today's visit in follow-up of her use of medications for treatment of diabetes and hyperlipidemia which can cause electrolyte abnormalities, abnormalities in renal function and liver function  8. Long term (current) use of aspirin CBC in follow-up of long-term use of aspirin and prior anemia.  Patient will also be placed on pantoprazole for stomach protection - CBC with Differential - pantoprazole (PROTONIX) 20 MG tablet; Take 1 tablet (20 mg total) by mouth daily. For stomach protection  Dispense: 30 tablet; Refill: 11  9. Iron deficiency anemia, unspecified iron deficiency anemia type She is currently on ferrous sulfate and CBC will be repeated at today's visit.  Hemoglobin during hospitalization on 05/13/2018 was 10.2 with low MCV of 75.3 - CBC with Differential  10. Ventricular septal defect (VSD), membranous Patient with history of VSD which increases her risk of stroke.  Patient will continue daily aspirin therapy and follow-up with her cardiologist at [place]. Allergic rhinitis, unspecified seasonality, unspecified trigger Prescription given for generic Zyrtec for treatment of allergic rhinitis - cetirizine (ZYRTEC) 10 MG tablet; Take 1 tablet (10 mg total) by mouth daily. At bedtime as needed  Dispense: 30 tablet; Refill: 11  12.  Dermatitis Discussed with patient that she likely has atopic dermatitis/eczema related to her asthma/allergic rhinitis.  Patient encouraged to use over-the-counter low-dose topical steroid cream such as hydrocortisone as needed and to keep skin moisturized.  Use lukewarm water with shower/bathing and avoid excessively hot water which will further dry out the skin.  Marland KitchenAn After Visit Summary was printed and given to the patient.    Return in about 4 months (around 11/22/2018) for DM.

## 2018-10-08 ENCOUNTER — Telehealth: Payer: Self-pay | Admitting: Primary Care

## 2018-10-08 NOTE — Telephone Encounter (Signed)
New Message   Pt states that she is still unable to get her JANUMET 50-1000 MG tablet, informed pt on the 5 refills but she says medicaid is still pending to pay for the prescription due to a fax missing for the physician. Please f/u

## 2018-10-11 DIAGNOSIS — H1045 Other chronic allergic conjunctivitis: Secondary | ICD-10-CM | POA: Diagnosis not present

## 2018-10-11 DIAGNOSIS — H25013 Cortical age-related cataract, bilateral: Secondary | ICD-10-CM | POA: Diagnosis not present

## 2018-10-11 DIAGNOSIS — E119 Type 2 diabetes mellitus without complications: Secondary | ICD-10-CM | POA: Diagnosis not present

## 2018-10-11 DIAGNOSIS — H2513 Age-related nuclear cataract, bilateral: Secondary | ICD-10-CM | POA: Diagnosis not present

## 2018-11-22 ENCOUNTER — Ambulatory Visit: Payer: Medicaid Other | Admitting: Primary Care

## 2018-11-22 ENCOUNTER — Ambulatory Visit (INDEPENDENT_AMBULATORY_CARE_PROVIDER_SITE_OTHER): Payer: Medicaid Other

## 2018-11-25 ENCOUNTER — Encounter: Payer: Self-pay | Admitting: Primary Care

## 2018-11-25 NOTE — Progress Notes (Unsigned)
Telephone encounter.

## 2018-11-30 DIAGNOSIS — I10 Essential (primary) hypertension: Secondary | ICD-10-CM | POA: Diagnosis not present

## 2018-11-30 DIAGNOSIS — Q210 Ventricular septal defect: Secondary | ICD-10-CM | POA: Diagnosis not present

## 2018-12-02 ENCOUNTER — Emergency Department (HOSPITAL_COMMUNITY)
Admission: EM | Admit: 2018-12-02 | Discharge: 2018-12-03 | Disposition: A | Discharge status: Home / Self Care | Payer: Medicaid Other | Attending: Emergency Medicine | Admitting: Emergency Medicine

## 2018-12-02 ENCOUNTER — Other Ambulatory Visit: Payer: Self-pay

## 2018-12-02 ENCOUNTER — Emergency Department (HOSPITAL_COMMUNITY): Payer: Medicaid Other

## 2018-12-02 DIAGNOSIS — J45909 Unspecified asthma, uncomplicated: Secondary | ICD-10-CM | POA: Insufficient documentation

## 2018-12-02 DIAGNOSIS — G459 Transient cerebral ischemic attack, unspecified: Secondary | ICD-10-CM | POA: Diagnosis not present

## 2018-12-02 DIAGNOSIS — E119 Type 2 diabetes mellitus without complications: Secondary | ICD-10-CM | POA: Insufficient documentation

## 2018-12-02 DIAGNOSIS — R4781 Slurred speech: Secondary | ICD-10-CM | POA: Diagnosis not present

## 2018-12-02 DIAGNOSIS — R531 Weakness: Secondary | ICD-10-CM | POA: Diagnosis not present

## 2018-12-02 DIAGNOSIS — Z91040 Latex allergy status: Secondary | ICD-10-CM | POA: Insufficient documentation

## 2018-12-02 DIAGNOSIS — Z880 Allergy status to penicillin: Secondary | ICD-10-CM | POA: Diagnosis not present

## 2018-12-02 DIAGNOSIS — Z7982 Long term (current) use of aspirin: Secondary | ICD-10-CM | POA: Diagnosis not present

## 2018-12-02 DIAGNOSIS — I10 Essential (primary) hypertension: Secondary | ICD-10-CM | POA: Diagnosis not present

## 2018-12-02 DIAGNOSIS — Z79899 Other long term (current) drug therapy: Secondary | ICD-10-CM | POA: Diagnosis not present

## 2018-12-02 LAB — CBC WITH DIFFERENTIAL/PLATELET
Abs Immature Granulocytes: 0.02 10*3/uL (ref 0.00–0.07)
Basophils Absolute: 0 10*3/uL (ref 0.0–0.1)
Basophils Relative: 0 %
Eosinophils Absolute: 0.1 10*3/uL (ref 0.0–0.5)
Eosinophils Relative: 2 %
HCT: 40.1 % (ref 36.0–46.0)
Hemoglobin: 11.8 g/dL — ABNORMAL LOW (ref 12.0–15.0)
Immature Granulocytes: 0 %
Lymphocytes Relative: 25 %
Lymphs Abs: 1.5 10*3/uL (ref 0.7–4.0)
MCH: 22.4 pg — ABNORMAL LOW (ref 26.0–34.0)
MCHC: 29.4 g/dL — ABNORMAL LOW (ref 30.0–36.0)
MCV: 76.1 fL — ABNORMAL LOW (ref 80.0–100.0)
Monocytes Absolute: 0.7 10*3/uL (ref 0.1–1.0)
Monocytes Relative: 12 %
Neutro Abs: 3.5 10*3/uL (ref 1.7–7.7)
Neutrophils Relative %: 61 %
Platelets: 244 10*3/uL (ref 150–400)
RBC: 5.27 MIL/uL — ABNORMAL HIGH (ref 3.87–5.11)
RDW: 19.4 % — ABNORMAL HIGH (ref 11.5–15.5)
WBC: 5.9 10*3/uL (ref 4.0–10.5)
nRBC: 0 % (ref 0.0–0.2)

## 2018-12-02 LAB — BASIC METABOLIC PANEL
Anion gap: 9 (ref 5–15)
BUN: 11 mg/dL (ref 6–20)
CO2: 22 mmol/L (ref 22–32)
Calcium: 9.7 mg/dL (ref 8.9–10.3)
Chloride: 105 mmol/L (ref 98–111)
Creatinine, Ser: 0.84 mg/dL (ref 0.44–1.00)
GFR calc Af Amer: >60 mL/min (ref >60)
GFR calc non Af Amer: >60 mL/min (ref >60)
Glucose, Bld: 155 mg/dL — ABNORMAL HIGH (ref 70–99)
Potassium: 3.9 mmol/L (ref 3.5–5.1)
Sodium: 136 mmol/L (ref 135–145)

## 2018-12-02 LAB — CBG MONITORING, ED: Glucose-Capillary: 160 mg/dL — ABNORMAL HIGH (ref 70–99)

## 2018-12-02 MED ORDER — LORAZEPAM 1 MG PO TABS
0.5000 mg | ORAL_TABLET | Freq: Once | ORAL | Status: AC
  Administered 2018-12-02: 20:00:00 0.5 mg via ORAL
  Filled 2018-12-02: qty 1

## 2018-12-02 NOTE — ED Triage Notes (Signed)
Pt BIB EMS with reports of Left sided weakness and numbness that resolved within 2-3 minutes. Pt has had a TIA and EMS recommended transport for evaluation.

## 2018-12-02 NOTE — ED Provider Notes (Signed)
[facility] EMERGENCY DEPARTMENT Provider Note   CSN: [id number] Arrival date & time: 12/02/18  1816    History   Chief Complaint Chief Complaint  Patient presents with  . Weakness    HPI Mackenzie Patterson is a 48 y.o. female.     48 year old female with prior medical history as detailed below presents for evaluation of transient left arm weakness.  Patient reports that approximately 1 hour prior to arrival she had a 10-minute episode of left arm tingling and weakness.  This was associated with slurred speech.  Her symptoms resolved completely after 10 minutes.  She is now without specific complaint.  She reports prior similar episode in November 2019.  She was admitted to [facility] at that time for work-up.  The history is provided by the patient and medical records.  Weakness  Severity:  Mild Onset quality:  Sudden Duration:  10 minutes Timing:  Rare Progression:  Resolved Chronicity:  New Relieved by:  Nothing Worsened by:  Nothing Ineffective treatments:  None tried   Past Medical History:  Diagnosis Date  . Asthma   . Breast discharge 01/10/2017  . Congenital ventricular septal defect   . Diabetes mellitus without complication ([place])   . Heart murmur   . Hypertension   . Obesity   . Pulmonary hypertension (HCC)    mild   . Tricuspid regurgitation   . VSD (ventricular septal defect), perimembranous    Qp:Qs 1.7:1 by catheterization     Patient Active Problem List   Diagnosis Date Noted  . Adenomyosis 08/14/2018  . Abnormal uterine bleeding (AUB) 08/14/2018  . Fibroids 05/10/2018  . 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 TIA (transient ischemic attack) 10/24/2017  . Tricuspid regurgitation   . Asthma   . VSD (ventricular septal defect), perimembranous 09/03/2013  . Pulmonary hypertension ([place]) 09/03/2013  . Essential hypertension 08/06/2013  . Diabetes mellitus ([place]) 08/06/2013  . Obesity 08/06/2013  . Hypertension 07/27/2012   . 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 Perimembranous ventricular septal defect 07/27/2012    Past Surgical History:  Procedure Laterality Date  . CARDIAC CATHETERIZATION [name] 10/30/2014   Procedure: Right Heart Cath;  Surgeon: [name], MD;  Location: [facility] INVASIVE CV LAB CUPID;  Service: Cardiovascular;  Laterality: [name];  . CHOLECYSTECTOMY  2018  . CYST EXCISION    . LEFT AND RIGHT HEART CATHETERIZATION WITH CORONARY ANGIOGRAM [name] 09/11/2013   Procedure: LEFT AND RIGHT HEART CATHETERIZATION WITH CORONARY ANGIOGRAM;  Surgeon: [name], MD;  Location: [facility] CATH LAB;  Service: Cardiovascular;  Laterality: [name];  . RIGHT HEART CATH [name] 09/02/2016   Procedure: Right Heart Cath;  Surgeon: [name], MD;  Location: [place] CV LAB;  Service: Cardiovascular;  Laterality: [name];  . TUBAL LIGATION       OB History    Gravida  3   Para  2   Term  1   Preterm  1   AB  1   Living  3     SAB  0   TAB  1   Ectopic  0   Multiple  1   Live Births  3            Home Medications    Prior to Admission medications   Medication Sig Start Date End Date Taking? Authorizing Provider  Accu-Chek FastClix Lancets MISC USE 2 TIMES DAILY AS DIRECTED 09/19/18   Kerin Perna, NP  albuterol (PROVENTIL HFA;VENTOLIN HFA)  108 (90 Base) MCG/ACT inhaler Inhale 2 puffs into the lungs every 6 (six) hours as needed for wheezing or shortness of breath. 07/24/18   Fulp, Cammie, MD  amLODipine (NORVASC) 5 MG tablet Take 1 tablet (5 mg total) by mouth daily. 09/19/18   Kerin Perna, NP  aspirin EC 81 MG tablet Take 1 tablet (81 mg total) by mouth daily. 07/24/18   Fulp, Cammie, MD  atorvastatin (LIPITOR) 20 MG tablet Take 1 tablet (20 mg total) by mouth daily. 07/24/18   Fulp, Cammie, MD  budesonide-formoterol (SYMBICORT) 160-4.5 MCG/ACT inhaler Inhale 2 puffs into the lungs 2 (two) times daily. 07/24/18   Fulp, Cammie, MD  carvedilol (COREG) 6.25 MG tablet Take 1 tablet  (6.25 mg total) by mouth 2 (two) times daily with a meal. 07/24/18   Fulp, Cammie, MD  cetirizine (ZYRTEC) 10 MG tablet Take 1 tablet (10 mg total) by mouth daily. At bedtime as needed 07/24/18   Fulp, Cammie, MD  docusate sodium (COLACE) 100 MG capsule Take 1 capsule (100 mg total) by mouth at bedtime as needed for mild constipation. 08/07/18   Fulp, Cammie, MD  ferrous sulfate 325 (65 FE) MG tablet Take 1 tablet (325 mg total) by mouth 2 (two) times daily with a meal. To treat anemia 07/29/18   Fulp, Cammie, MD  glucose blood (ACCU-CHEK GUIDE) test strip USE 2 TIMES DAILY AS DIRECFTED 04/12/18   Clent Demark, PA-C  JANUMET 50-1000 MG tablet Take 1 tablet by mouth 2 (two) times daily with a meal. 09/19/18   Kerin Perna, NP  lisinopril (PRINIVIL,ZESTRIL) 20 MG tablet Take 1 tablet (20 mg total) by mouth daily. 07/24/18   Fulp, Cammie, MD  megestrol (MEGACE) 40 MG tablet Take 2 tablets (80 mg total) by mouth daily. Can increase to two tablets twice a day in the event of heavy bleeding 08/14/18   Anyanwu, Sallyanne Havers, MD  pantoprazole (PROTONIX) 20 MG tablet Take 1 tablet (20 mg total) by mouth daily. For stomach protection 07/24/18   Fulp, Cammie, MD  triamcinolone cream (KENALOG) 0.1 % Apply 1 application topically 2 (two) times daily. 09/19/18   Kerin Perna, NP    Family History Family History  Problem Relation Age of Onset  . Cancer Other   . Hyperlipidemia Other   . Hypertension Other   . Asthma Other   . Diabetes Mother   . Diabetes Father     Social History Social History   Tobacco Use  . Smoking status: Never Smoker  . Smokeless tobacco: Never Used  Substance Use Topics  . Alcohol use: No  . Drug use: No     Allergies   Penicillins; Tape; Other; and Latex   Review of Systems Review of Systems  Neurological: Positive for weakness.  All other systems reviewed and are negative.    Physical Exam Updated Vital Signs BP (!) 154/91   Pulse (!) 104   Temp 99.1  F (37.3 C) (Oral)   Resp 16   Ht 4\' 11"  (1.499 m)   Wt 73.5 kg   SpO2 100%   BMI 32.72 kg/m   Physical Exam Vitals signs and nursing note reviewed.  Constitutional:      General: She is not in acute distress.    Appearance: She is well-developed.  HENT:     Head: Normocephalic and atraumatic.  Eyes:     Conjunctiva/sclera: Conjunctivae normal.     Pupils: Pupils are equal, round, and reactive to light.  Neck:     Musculoskeletal: Normal range of motion and neck supple.  Cardiovascular:     Rate and Rhythm: Normal rate and regular rhythm.     Heart sounds: Normal heart sounds.  Pulmonary:     Effort: Pulmonary effort is normal. No respiratory distress.     Breath sounds: Normal breath sounds.  Abdominal:     General: There is no distension.     Palpations: Abdomen is soft.     Tenderness: There is no abdominal tenderness.  Musculoskeletal: Normal range of motion.        General: No deformity.  Skin:    General: Skin is warm and dry.  Neurological:     General: No focal deficit present.     Mental Status: She is alert and oriented to person, place, and time. Mental status is at baseline.     Cranial Nerves: No cranial nerve deficit.     Sensory: No sensory deficit.     Motor: No weakness.     Coordination: Coordination normal.     Gait: Gait normal.     Deep Tendon Reflexes: Reflexes normal.     Comments: Normal speech No facial droop VAN negative       ED Treatments / Results  Labs (all labs ordered are listed, but only abnormal results are displayed) Labs Reviewed  CBG MONITORING, ED - Abnormal; Notable for the following components:      Result Value   Glucose-Capillary 160 (*)    All other components within normal limits  BASIC METABOLIC PANEL  CBC WITH DIFFERENTIAL/PLATELET    EKG EKG Interpretation  Date/Time:  Sunday December 02 2018 18:17:50 EDT Ventricular Rate:  104 PR Interval:    QRS Duration: 78 QT Interval:  337 QTC Calculation: 444 R  Axis:   80 Text Interpretation:  Sinus tachycardia Consider right atrial enlargement Confirmed by Dene Gentry [id number]) on 12/02/2018 6:23:38 PM   Radiology No results found.  Procedures Procedures (including critical care time)  Medications Ordered in ED Medications - No data to display   Initial Impression / Assessment and Plan / ED Course  I have reviewed the triage vital signs and the nursing notes.  Pertinent labs & imaging results that were available during my care of the patient were reviewed by me and considered in my medical decision making (see chart for details).        MDM  Screen complete  Mollye Guinta was evaluated in Emergency Department on 12/02/2018 for the symptoms described in the history of present illness. She was evaluated in the context of the global COVID-19 pandemic, which necessitated consideration that the patient might be at risk for infection with the SARS-CoV-2 virus that causes COVID-19. Institutional protocols and algorithms that pertain to the evaluation of patients at risk for COVID-19 are in a state of rapid change based on information released by regulatory bodies including the CDC and federal and state organizations. These policies and algorithms were followed during the patient's care in the ED.   Patient is presenting for evaluation of symptoms consistent with likely TIA.  Patient discussed with neurology Dr. Bernie Covey who recommends MRI from the ED for further evaluation.  Patient is neurologically intact and asymptomatic at this time.  Benjie Karvonen aware of pending MRI and disposition.   Final Clinical Impressions(s) / ED Diagnoses   Final diagnoses:  TIA (transient ischemic attack)    ED Discharge Orders    None       [place],  Wallis Bamberg, MD 12/02/18 1901

## 2018-12-02 NOTE — ED Provider Notes (Signed)
Received signout from Dr. Francia Greaves.  Patient here with symptoms consistent with TIA.  Neurology, Dr. Lorraine Lax was consulted who recommend MRI for further evaluation.  Currently MRI is pending.  Will discuss with neurology once MRI resulted.  8:47 PM Pt sign out to Dr. Ralene Bathe who will f/u on MRI result and determine disposition.   BP (!) 146/84   Pulse (!) 105   Temp 99.1 F (37.3 C) (Oral)   Resp 19   Ht 4\' 11"  (1.499 m)   Wt 73.5 kg   SpO2 100%   BMI 32.72 kg/m   Results for orders placed or performed during the hospital encounter of 30/09/23  Basic metabolic panel  Result Value Ref Range   Sodium 136 135 - 145 mmol/L   Potassium 3.9 3.5 - 5.1 mmol/L   Chloride 105 98 - 111 mmol/L   CO2 22 22 - 32 mmol/L   Glucose, Bld 155 (H) 70 - 99 mg/dL   BUN 11 6 - 20 mg/dL   Creatinine, Ser 0.84 0.44 - 1.00 mg/dL   Calcium 9.7 8.9 - 10.3 mg/dL   GFR calc non Af Amer >60 >60 mL/min   GFR calc Af Amer >60 >60 mL/min   Anion gap 9 5 - 15  CBC with Differential  Result Value Ref Range   WBC 5.9 4.0 - 10.5 K/uL   RBC 5.27 (H) 3.87 - 5.11 MIL/uL   Hemoglobin 11.8 (L) 12.0 - 15.0 g/dL   HCT 40.1 36.0 - 46.0 %   MCV 76.1 (L) 80.0 - 100.0 fL   MCH 22.4 (L) 26.0 - 34.0 pg   MCHC 29.4 (L) 30.0 - 36.0 g/dL   RDW 19.4 (H) 11.5 - 15.5 %   Platelets 244 150 - 400 K/uL   nRBC 0.0 0.0 - 0.2 %   Neutrophils Relative % 61 %   Neutro Abs 3.5 1.7 - 7.7 K/uL   Lymphocytes Relative 25 %   Lymphs Abs 1.5 0.7 - 4.0 K/uL   Monocytes Relative 12 %   Monocytes Absolute 0.7 0.1 - 1.0 K/uL   Eosinophils Relative 2 %   Eosinophils Absolute 0.1 0.0 - 0.5 K/uL   Basophils Relative 0 %   Basophils Absolute 0.0 0.0 - 0.1 K/uL   Immature Granulocytes 0 %   Abs Immature Granulocytes 0.02 0.00 - 0.07 K/uL  CBG monitoring, ED  Result Value Ref Range   Glucose-Capillary 160 (H) 70 - 99 mg/dL   No results found.    Domenic Moras, PA-C 12/02/18 2047    Valarie Merino, MD 12/04/18 [phone]

## 2018-12-26 DIAGNOSIS — I10 Essential (primary) hypertension: Secondary | ICD-10-CM | POA: Diagnosis not present

## 2018-12-26 DIAGNOSIS — Q210 Ventricular septal defect: Secondary | ICD-10-CM | POA: Diagnosis not present

## 2019-01-17 ENCOUNTER — Other Ambulatory Visit (INDEPENDENT_AMBULATORY_CARE_PROVIDER_SITE_OTHER): Payer: Self-pay | Admitting: Primary Care

## 2019-01-17 DIAGNOSIS — R21 Rash and other nonspecific skin eruption: Secondary | ICD-10-CM

## 2019-01-17 NOTE — Telephone Encounter (Signed)
FWD to PCP. Tempestt S Roberts, CMA  

## 2019-01-30 DIAGNOSIS — R0683 Snoring: Secondary | ICD-10-CM | POA: Diagnosis not present

## 2019-01-30 DIAGNOSIS — Q210 Ventricular septal defect: Secondary | ICD-10-CM | POA: Diagnosis not present

## 2019-03-01 DIAGNOSIS — R471 Dysarthria and anarthria: Secondary | ICD-10-CM | POA: Diagnosis not present

## 2019-03-01 DIAGNOSIS — G459 Transient cerebral ischemic attack, unspecified: Secondary | ICD-10-CM | POA: Diagnosis not present

## 2019-03-01 DIAGNOSIS — M6281 Muscle weakness (generalized): Secondary | ICD-10-CM | POA: Diagnosis not present

## 2019-03-01 DIAGNOSIS — R200 Anesthesia of skin: Secondary | ICD-10-CM | POA: Diagnosis not present

## 2019-03-25 ENCOUNTER — Other Ambulatory Visit: Payer: Self-pay

## 2019-03-25 ENCOUNTER — Encounter (HOSPITAL_COMMUNITY): Payer: Self-pay

## 2019-03-25 ENCOUNTER — Ambulatory Visit (HOSPITAL_COMMUNITY)
Admission: EM | Admit: 2019-03-25 | Discharge: 2019-03-25 | Disposition: A | Discharge status: Home / Self Care | Payer: Medicaid Other | Attending: Emergency Medicine | Admitting: Emergency Medicine

## 2019-03-25 DIAGNOSIS — L03116 Cellulitis of left lower limb: Secondary | ICD-10-CM | POA: Diagnosis not present

## 2019-03-25 MED ORDER — DOXYCYCLINE HYCLATE 100 MG PO CAPS: 100.0000 mg | ORAL_CAPSULE | Freq: Two times a day (BID) | ORAL | 0 refills | Status: DC

## 2019-03-25 NOTE — ED Provider Notes (Signed)
[facility]    CSN: [id number] Arrival date & time: 03/25/19  0801      History   Chief Complaint Chief Complaint  Patient presents with  . Insect Bite    HPI Mackenzie Patterson is a 48 y.o. female.   Mackenzie Patterson presents with complaints of potential bug bite to calf of left leg. 9/24 she was outside, noted later that day and primarily the following morning an area of itching with pustule. Yesterday she was able to open the blister and it drained. It had refilled this morning and is now tender surrounding. There wasn't a specific known bug visualized or specific point in time in which the bite occurred. No fever. Denies any previous similar. She has applied bacitracin to it. History of dm. Anaphylaxis to penicillins.     ROS per HPI, negative if not otherwise mentioned.      Past Medical History:  Diagnosis Date  . Asthma   . Breast discharge 01/10/2017  . Congenital ventricular septal defect   . Diabetes mellitus without complication ([place])   . Heart murmur   . Hypertension   . Obesity   . Pulmonary hypertension (HCC)    mild   . Tricuspid regurgitation   . VSD (ventricular septal defect), perimembranous    Qp:Qs 1.7:1 by catheterization     Patient Active Problem List   Diagnosis Date Noted  . Adenomyosis 08/14/2018  . Abnormal uterine bleeding (AUB) 08/14/2018  . Fibroids 05/10/2018  . 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 TIA (transient ischemic attack) 10/24/2017  . Tricuspid regurgitation   . Asthma   . VSD (ventricular septal defect), perimembranous 09/03/2013  . Pulmonary hypertension ([place]) 09/03/2013  . Essential hypertension 08/06/2013  . Diabetes mellitus ([place]) 08/06/2013  . Obesity 08/06/2013  . Hypertension 07/27/2012  . 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 Perimembranous ventricular septal defect 07/27/2012    Past Surgical History:  Procedure Laterality Date  . CARDIAC CATHETERIZATION [name] 10/30/2014   Procedure: Right Heart Cath;  Surgeon: [name], MD;  Location: [facility] INVASIVE CV LAB CUPID;  Service: Cardiovascular;  Laterality: [name];  . CHOLECYSTECTOMY  2018  . CYST EXCISION    . LEFT AND RIGHT HEART CATHETERIZATION WITH CORONARY ANGIOGRAM [name] 09/11/2013   Procedure: LEFT AND RIGHT HEART CATHETERIZATION WITH CORONARY ANGIOGRAM;  Surgeon: [name], MD;  Location: [facility] CATH LAB;  Service: Cardiovascular;  Laterality: [name];  . RIGHT HEART CATH [name] 09/02/2016   Procedure: Right Heart Cath;  Surgeon: [name], MD;  Location: [facility] CV LAB;  Service: Cardiovascular;  Laterality: [name];  . TUBAL LIGATION      OB History    Gravida  3   Para  2   Term  1   Preterm  1   AB  1   Living  3     SAB  0   TAB  1   Ectopic  0   Multiple  1   Live Births  3            Home Medications    Prior to Admission medications   Medication Sig Start Date End Date Taking? Authorizing Provider  Accu-Chek FastClix Lancets MISC USE 2 TIMES DAILY AS DIRECTED 09/19/18   Kerin Perna, NP  albuterol (PROVENTIL HFA;VENTOLIN HFA) 108 (90 Base) MCG/ACT inhaler Inhale 2 puffs into the lungs every 6 (six) hours as needed for wheezing or shortness of breath. 07/24/18   Antony Blackbird, MD  amLODipine (NORVASC) 5 MG tablet Take 1 tablet (5 mg total) by mouth daily. 09/19/18   Kerin Perna, NP  aspirin EC 81 MG tablet Take 1 tablet (81 mg total) by mouth daily. 07/24/18   Fulp, Cammie, MD  atorvastatin (LIPITOR) 20 MG tablet Take 1 tablet (20 mg total) by mouth daily. 07/24/18   Fulp, Cammie, MD  budesonide-formoterol (SYMBICORT) 160-4.5 MCG/ACT inhaler Inhale 2 puffs into the lungs 2 (two) times daily. 07/24/18   Fulp, Cammie, MD  carvedilol (COREG) 6.25 MG tablet Take 1 tablet (6.25 mg total) by mouth 2 (two) times daily with a meal. 07/24/18   Fulp, Cammie, MD  cetirizine (ZYRTEC) 10 MG tablet Take 1 tablet (10 mg total) by mouth daily. At bedtime as needed 07/24/18   Fulp, Cammie,  MD  docusate sodium (COLACE) 100 MG capsule Take 1 capsule (100 mg total) by mouth at bedtime as needed for mild constipation. 08/07/18   Fulp, Cammie, MD  doxycycline (VIBRAMYCIN) 100 MG capsule Take 1 capsule (100 mg total) by mouth 2 (two) times daily. 03/25/19   Zigmund Gottron, NP  ferrous sulfate 325 (65 FE) MG tablet Take 1 tablet (325 mg total) by mouth 2 (two) times daily with a meal. To treat anemia 07/29/18   Fulp, Cammie, MD  glucose blood (ACCU-CHEK GUIDE) test strip USE 2 TIMES DAILY AS DIRECFTED 04/12/18   Clent Demark, PA-C  JANUMET 50-1000 MG tablet Take 1 tablet by mouth 2 (two) times daily with a meal. 09/19/18   Kerin Perna, NP  lisinopril (PRINIVIL,ZESTRIL) 20 MG tablet Take 1 tablet (20 mg total) by mouth daily. 07/24/18   Fulp, Cammie, MD  megestrol (MEGACE) 40 MG tablet Take 2 tablets (80 mg total) by mouth daily. Can increase to two tablets twice a day in the event of heavy bleeding 08/14/18   Anyanwu, Sallyanne Havers, MD  pantoprazole (PROTONIX) 20 MG tablet Take 1 tablet (20 mg total) by mouth daily. For stomach protection 07/24/18   Fulp, Cammie, MD  triamcinolone cream (KENALOG) 0.1 % APPLY TO AFFECTED AREA TWICE A DAY 01/20/19   Kerin Perna, NP    Family History Family History  Problem Relation Age of Onset  . Cancer Other   . Hyperlipidemia Other   . Hypertension Other   . Asthma Other   . Diabetes Mother   . Diabetes Father     Social History Social History   Tobacco Use  . Smoking status: Never Smoker  . Smokeless tobacco: Never Used  Substance Use Topics  . Alcohol use: No  . Drug use: No     Allergies   Penicillins, Tape, Other, and Latex   Review of Systems Review of Systems   Physical Exam Triage Vital Signs ED Triage Vitals  Enc Vitals Group     BP 03/25/19 0812 125/87     Pulse Rate 03/25/19 0812 82     Resp 03/25/19 0812 16     Temp 03/25/19 0812 98.2 F (36.8 C)     Temp Source 03/25/19 0812 Temporal     SpO2  03/25/19 0812 100 %     Weight --      Height --      Head Circumference --      [facility] Flow --      Pain Score 03/25/19 0817 5     Pain Loc --      Pain Edu? --      Excl. in [place]? --  No data found.  Updated Vital Signs BP 125/87 (BP Location: Right Arm)   Pulse 82   Temp 98.2 F (36.8 C) (Temporal)   Resp 16   LMP 03/22/2019   SpO2 100%   Visual Acuity Right Eye Distance:   Left Eye Distance:   Bilateral Distance:    Right Eye Near:   Left Eye Near:    Bilateral Near:     Physical Exam Constitutional:      General: She is not in acute distress.    Appearance: She is well-developed.  Cardiovascular:     Rate and Rhythm: Normal rate.  Pulmonary:     Effort: Pulmonary effort is normal.  Skin:    General: Skin is warm and dry.          Comments: Approximately 4 mm in diameter pustule with apparent white fluid with red ring surrounding. No bullseye appearance; tenderness to surrounding tissue although no induration redness or other induration   Neurological:     Mental Status: She is alert and oriented to person, place, and time.      UC Treatments / Results  Labs (all labs ordered are listed, but only abnormal results are displayed) Labs Reviewed - No data to display  EKG   Radiology No results found.  Procedures Procedures (including critical care time)  Medications Ordered in UC Medications - No data to display  Initial Impression / Assessment and Plan / UC Course  I have reviewed the triage vital signs and the nursing notes.  Pertinent labs & imaging results that were available during my care of the patient were reviewed by me and considered in my medical decision making (see chart for details).     Small abscess/ region of cellulitis, in presence of DM. Doxy course provided. No indication for incision at this time as some small amount of drainage already even. Return precautions provided. Patient verbalized understanding and agreeable to plan.    Final Clinical Impressions(s) / UC Diagnoses   Final diagnoses:  Cellulitis of left lower extremity     Discharge Instructions     You may use topical hydrocortisone or benadryl as needed for itching.  Oral zyrtec or benadryl as needed for itching.  Warm compresses to promote any drainage but I would avoid squeezing it much.  Complete course of antibiotics.  If symptoms worsen or do not improve in the next week to return to be seen or to follow up with your PCP.     ED Prescriptions    Medication Sig Dispense Auth. Provider   doxycycline (VIBRAMYCIN) 100 MG capsule Take 1 capsule (100 mg total) by mouth 2 (two) times daily. 20 capsule Zigmund Gottron, NP     PDMP not reviewed this encounter.   Zigmund Gottron, NP 03/25/19 [phone]

## 2019-03-25 NOTE — ED Triage Notes (Signed)
Pt presents with insect bite to left le that is painful and itchy X 4 days.

## 2019-03-25 NOTE — Discharge Instructions (Signed)
You may use topical hydrocortisone or benadryl as needed for itching.  Oral zyrtec or benadryl as needed for itching.  Warm compresses to promote any drainage but I would avoid squeezing it much.  Complete course of antibiotics.  If symptoms worsen or do not improve in the next week to return to be seen or to follow up with your PCP.

## 2019-05-06 ENCOUNTER — Other Ambulatory Visit: Payer: Self-pay | Admitting: Internal Medicine

## 2019-05-06 DIAGNOSIS — Z1231 Encounter for screening mammogram for malignant neoplasm of breast: Secondary | ICD-10-CM

## 2019-05-07 ENCOUNTER — Other Ambulatory Visit: Payer: Self-pay

## 2019-05-07 ENCOUNTER — Ambulatory Visit
Admission: RE | Admit: 2019-05-07 | Discharge: 2019-05-07 | Disposition: A | Discharge status: Home / Self Care | Payer: Medicaid Other | Source: Ambulatory Visit | Attending: Internal Medicine | Admitting: Internal Medicine

## 2019-05-07 DIAGNOSIS — Z1231 Encounter for screening mammogram for malignant neoplasm of breast: Secondary | ICD-10-CM | POA: Diagnosis not present

## 2019-06-05 DIAGNOSIS — I10 Essential (primary) hypertension: Secondary | ICD-10-CM | POA: Diagnosis not present

## 2019-06-05 DIAGNOSIS — Q210 Ventricular septal defect: Secondary | ICD-10-CM | POA: Diagnosis not present

## 2019-06-27 ENCOUNTER — Encounter (INDEPENDENT_AMBULATORY_CARE_PROVIDER_SITE_OTHER): Payer: Self-pay | Admitting: Primary Care

## 2019-06-27 ENCOUNTER — Ambulatory Visit (INDEPENDENT_AMBULATORY_CARE_PROVIDER_SITE_OTHER): Payer: Medicaid Other | Admitting: Primary Care

## 2019-06-27 ENCOUNTER — Other Ambulatory Visit: Payer: Self-pay

## 2019-06-27 VITALS — BP 137/88 | HR 82 | Temp 97.7°F | Ht 59.0 in | Wt 169.8 lb

## 2019-06-27 DIAGNOSIS — Z0000 Encounter for general adult medical examination without abnormal findings: Secondary | ICD-10-CM | POA: Diagnosis not present

## 2019-06-27 DIAGNOSIS — I10 Essential (primary) hypertension: Secondary | ICD-10-CM | POA: Diagnosis not present

## 2019-06-27 DIAGNOSIS — E781 Pure hyperglyceridemia: Secondary | ICD-10-CM | POA: Diagnosis not present

## 2019-06-27 DIAGNOSIS — J4520 Mild intermittent asthma, uncomplicated: Secondary | ICD-10-CM

## 2019-06-27 DIAGNOSIS — E119 Type 2 diabetes mellitus without complications: Secondary | ICD-10-CM

## 2019-06-27 LAB — POCT GLYCOSYLATED HEMOGLOBIN (HGB A1C): Hemoglobin A1C: 6.2 % — AB (ref 4.0–5.6)

## 2019-06-27 LAB — GLUCOSE, POCT (MANUAL RESULT ENTRY): POC Glucose: 92 mg/dl (ref 70–99)

## 2019-06-27 MED ORDER — LISINOPRIL 20 MG PO TABS: 20.0000 mg | ORAL_TABLET | Freq: Every day | ORAL | 3 refills | Status: DC

## 2019-06-27 MED ORDER — JANUMET 50-500 MG PO TABS: 1.0000 | ORAL_TABLET | Freq: Two times a day (BID) | ORAL | 1 refills | Status: DC

## 2019-06-27 MED ORDER — BUDESONIDE-FORMOTEROL FUMARATE 160-4.5 MCG/ACT IN AERO: 2.0000 | INHALATION_SPRAY | Freq: Two times a day (BID) | RESPIRATORY_TRACT | 5 refills | Status: DC

## 2019-06-27 MED ORDER — CARVEDILOL 6.25 MG PO TABS: 6.2500 mg | ORAL_TABLET | Freq: Two times a day (BID) | ORAL | 1 refills | Status: DC

## 2019-06-27 MED ORDER — ALBUTEROL SULFATE HFA 108 (90 BASE) MCG/ACT IN AERS: 2.0000 | INHALATION_SPRAY | Freq: Four times a day (QID) | RESPIRATORY_TRACT | 1 refills | Status: DC | PRN

## 2019-06-27 MED ORDER — AMLODIPINE BESYLATE 5 MG PO TABS: 5.0000 mg | ORAL_TABLET | Freq: Every day | ORAL | 1 refills | Status: DC

## 2019-06-27 MED ORDER — ATORVASTATIN CALCIUM 20 MG PO TABS: 20.0000 mg | ORAL_TABLET | Freq: Every day | ORAL | 1 refills | Status: DC

## 2019-06-27 MED ORDER — AMMONIUM LACTATE 12 % EX LOTN: 1.0000 "application " | TOPICAL_LOTION | CUTANEOUS | 1 refills | Status: DC | PRN

## 2019-06-27 NOTE — Patient Instructions (Signed)

## 2019-06-28 LAB — CMP14+EGFR
ALT: 31 IU/L (ref 0–32)
AST: 31 IU/L (ref 0–40)
Albumin/Globulin Ratio: 1.4 (ref 1.2–2.2)
Albumin: 4.3 g/dL (ref 3.8–4.8)
Alkaline Phosphatase: 75 IU/L (ref 39–117)
BUN/Creatinine Ratio: 12 (ref 9–23)
BUN: 10 mg/dL (ref 6–24)
Bilirubin Total: 0.3 mg/dL (ref 0.0–1.2)
CO2: 22 mmol/L (ref 20–29)
Calcium: 9.6 mg/dL (ref 8.7–10.2)
Chloride: 102 mmol/L (ref 96–106)
Creatinine, Ser: 0.83 mg/dL (ref 0.57–1.00)
GFR calc Af Amer: 97 mL/min/{1.73_m2} (ref >59)
GFR calc non Af Amer: 84 mL/min/{1.73_m2} (ref >59)
Globulin, Total: 3.1 g/dL (ref 1.5–4.5)
Glucose: 75 mg/dL (ref 65–99)
Potassium: 4.7 mmol/L (ref 3.5–5.2)
Sodium: 137 mmol/L (ref 134–144)
Total Protein: 7.4 g/dL (ref 6.0–8.5)

## 2019-06-28 LAB — LIPID PANEL
Chol/HDL Ratio: 3.4 ratio (ref 0.0–4.4)
Cholesterol, Total: 165 mg/dL (ref 100–199)
HDL: 48 mg/dL (ref >39)
LDL Chol Calc (NIH): 89 mg/dL (ref 0–99)
Triglycerides: 165 mg/dL — ABNORMAL HIGH (ref 0–149)
VLDL Cholesterol Cal: 28 mg/dL (ref 5–40)

## 2019-06-28 LAB — CBC WITH DIFFERENTIAL/PLATELET
Basophils Absolute: 0 10*3/uL (ref 0.0–0.2)
Basos: 1 %
EOS (ABSOLUTE): 0.1 10*3/uL (ref 0.0–0.4)
Eos: 2 %
Hematocrit: 35.2 % (ref 34.0–46.6)
Hemoglobin: 10.8 g/dL — ABNORMAL LOW (ref 11.1–15.9)
Immature Grans (Abs): 0 10*3/uL (ref 0.0–0.1)
Immature Granulocytes: 1 %
Lymphocytes Absolute: 1.9 10*3/uL (ref 0.7–3.1)
Lymphs: 31 %
MCH: 22.3 pg — ABNORMAL LOW (ref 26.6–33.0)
MCHC: 30.7 g/dL — ABNORMAL LOW (ref 31.5–35.7)
MCV: 73 fL — ABNORMAL LOW (ref 79–97)
Monocytes Absolute: 0.7 10*3/uL (ref 0.1–0.9)
Monocytes: 12 %
Neutrophils Absolute: 3.4 10*3/uL (ref 1.4–7.0)
Neutrophils: 53 %
Platelets: 260 10*3/uL (ref 150–450)
RBC: 4.84 x10E6/uL (ref 3.77–5.28)
RDW: 16.7 % — ABNORMAL HIGH (ref 11.7–15.4)
WBC: 6.1 10*3/uL (ref 3.4–10.8)

## 2019-06-30 NOTE — Progress Notes (Signed)
Established Patient Office Visit  Subjective:  [name], female    DOB: 03/16/1971  Age: 49 y.o. MRN: [id number]  CC:  Chief Complaint  Patient presents with  . Follow-up    HTN/DM    HPI Mackenzie Patterson presents for annual physical and management of hypertension -she denies shortness of breath, headaches, chest pain or lower extremity edema. 05/14/2018 last noted A1c  Continued her oral medications for diabetes. She voices no problems or concerns.  Past Medical History:  Diagnosis Date  . Asthma   . Breast discharge 01/10/2017  . Congenital ventricular septal defect   . Diabetes mellitus without complication ([place])   . Heart murmur   . Hypertension   . Obesity   . Pulmonary hypertension (HCC)    mild   . Tricuspid regurgitation   . VSD (ventricular septal defect), perimembranous    Qp:Qs 1.7:1 by catheterization     Past Surgical History:  Procedure Laterality Date  . CARDIAC CATHETERIZATION [name] 10/30/2014   Procedure: Right Heart Cath;  Surgeon: [name], MD;  Location: [facility] INVASIVE CV LAB CUPID;  Service: Cardiovascular;  Laterality: [name];  . CHOLECYSTECTOMY  2018  . CYST EXCISION    . LEFT AND RIGHT HEART CATHETERIZATION WITH CORONARY ANGIOGRAM [name] 09/11/2013   Procedure: LEFT AND RIGHT HEART CATHETERIZATION WITH CORONARY ANGIOGRAM;  Surgeon: [name], MD;  Location: [facility] CATH LAB;  Service: Cardiovascular;  Laterality: [name];  . RIGHT HEART CATH [name] 09/02/2016   Procedure: Right Heart Cath;  Surgeon: [name], MD;  Location: [place] CV LAB;  Service: Cardiovascular;  Laterality: [name];  . TUBAL LIGATION      Family History  Problem Relation Age of Onset  . Cancer Other   . Hyperlipidemia Other   . Hypertension Other   . Asthma Other   . Diabetes Mother   . Diabetes Father     Social History   Socioeconomic History  . Marital status: Single    Spouse name: Not on file  . Number of children: Not on file  . Years of education: Not on  file  . Highest education level: Not on file  Occupational History  . Not on file  Tobacco Use  . Smoking status: Never Smoker  . Smokeless tobacco: Never Used  Substance and Sexual Activity  . Alcohol use: No  . Drug use: No  . Sexual activity: Yes    Birth control/protection: Surgical  Other Topics Concern  . Not on file  Social History Narrative   Lives with boyfriend. On disability.    Social Determinants of Health   Financial Resource Strain:   . Difficulty of Paying Living Expenses: Not on file  Food Insecurity:   . Worried About Charity fundraiser in the Last Year: Not on file  . Ran Out of Food in the Last Year: Not on file  Transportation Needs:   . Lack of Transportation (Medical): Not on file  . Lack of Transportation (Non-Medical): Not on file  Physical Activity:   . Days of Exercise per Week: Not on file  . Minutes of Exercise per Session: Not on file  Stress:   . Feeling of Stress : Not on file  Social Connections:   . Frequency of Communication with Friends and Family: Not on file  . Frequency of Social Gatherings with Friends and Family: Not on file  . Attends Religious Services: Not on file  . Active Member of Clubs or  Organizations: Not on file  . Attends Archivist Meetings: Not on file  . Marital Status: Not on file  Intimate Partner Violence:   . Fear of Current or Ex-Partner: Not on file  . Emotionally Abused: Not on file  . Physically Abused: Not on file  . Sexually Abused: Not on file    Outpatient Medications Prior to Visit  Medication Sig Dispense Refill  . Accu-Chek FastClix Lancets MISC USE 2 TIMES DAILY AS DIRECTED 100 each 1  . aspirin EC 81 MG tablet Take 1 tablet (81 mg total) by mouth daily. 90 tablet 3  . cetirizine (ZYRTEC) 10 MG tablet Take 1 tablet (10 mg total) by mouth daily. At bedtime as needed 30 tablet 11  . glucose blood (ACCU-CHEK GUIDE) test strip USE 2 TIMES DAILY AS DIRECFTED 70 each 5  . JANUMET 50-1000 MG  tablet Take 1 tablet by mouth 2 (two) times daily with a meal. 60 tablet 5  . pantoprazole (PROTONIX) 20 MG tablet Take 1 tablet (20 mg total) by mouth daily. For stomach protection 30 tablet 11  . albuterol (PROVENTIL HFA;VENTOLIN HFA) 108 (90 Base) MCG/ACT inhaler Inhale 2 puffs into the lungs every 6 (six) hours as needed for wheezing or shortness of breath. 1 Inhaler 5  . amLODipine (NORVASC) 5 MG tablet Take 1 tablet (5 mg total) by mouth daily. 90 tablet 1  . atorvastatin (LIPITOR) 20 MG tablet Take 1 tablet (20 mg total) by mouth daily. 90 tablet 3  . budesonide-formoterol (SYMBICORT) 160-4.5 MCG/ACT inhaler Inhale 2 puffs into the lungs 2 (two) times daily. 1 Inhaler 5  . carvedilol (COREG) 6.25 MG tablet Take 1 tablet (6.25 mg total) by mouth 2 (two) times daily with a meal. 60 tablet 11  . lisinopril (PRINIVIL,ZESTRIL) 20 MG tablet Take 1 tablet (20 mg total) by mouth daily. 90 tablet 3  . ferrous sulfate 325 (65 FE) MG tablet Take 1 tablet (325 mg total) by mouth 2 (two) times daily with a meal. To treat anemia (Patient not taking: Reported on 06/27/2019) 60 tablet 2  . docusate sodium (COLACE) 100 MG capsule Take 1 capsule (100 mg total) by mouth at bedtime as needed for mild constipation. 30 capsule 3  . doxycycline (VIBRAMYCIN) 100 MG capsule Take 1 capsule (100 mg total) by mouth 2 (two) times daily. 20 capsule 0  . megestrol (MEGACE) 40 MG tablet Take 2 tablets (80 mg total) by mouth daily. Can increase to two tablets twice a day in the event of heavy bleeding 60 tablet 5  . triamcinolone cream (KENALOG) 0.1 % APPLY TO AFFECTED AREA TWICE A DAY 30 g 0   No facility-administered medications prior to visit.    Allergies  Allergen Reactions  . Penicillins Anaphylaxis      . Tape Rash    Other reaction(s): Other (See Comments) Burns skin  . Other Rash    Other reaction(s): Other (See Comments) EKG GEL PADS "Burns my skin" Pt states she is allergic to a blood pressure  medication, unsure.   . Latex Rash    ROS Review of Systems  Constitutional: Positive for appetite change and fatigue.       No appetite makes herself eat   Endocrine: Positive for polyuria.  All other systems reviewed and are negative.     Objective:   Health Risk Assessment The patient has completed a Health Risk Assessment. This has been reviewed with the patient and has been scanned into the [facility]  system as a separate document.   Current Medical Providers and Suppliers The providers who are involved in the care of this patient are listed above. Additional providers and suppliers are listed below:    Age-appropriate Screening Schedule Refer to the list in the Health Maintenance section for an age appropriated screening completed by this patient. Additional screening recommendations are listed below in the plan section. The patient has been provided with a written plan.    Health Maintenance Due  Topic Date Due  . PNEUMOCOCCAL POLYSACCHARIDE VACCINE AGE 62-64 HIGH RISK  02/10/1974  . OPHTHALMOLOGY EXAM  02/10/1982  . FOOT EXAM  01/17/2019    Depression Screen Over the past two weeks have you:     Felt down or depressed? Yes     Had little interest or pleasure in doing things? no   Functional Ability/Safety Screen 1. Falls Risk: Does the patient need assistance with ambulation? no Does the patient have a history of a fall in the last 90 days? no Is the patient at risk for falls?no Was the patient's timed "Get Up and Go Test" unsteady or longer than 30 seconds? No  2. Does the patient need help with: Ron Parker index)         ABLE DO DUE ALL ADL's           Hearing Evaluation:     Do you have trouble hearing the television when others do not? {no     Do you have to strain to hear/understand conversations? No  Advanced Care Planning     Patient has executed an Advance Directive: NO Full Code     ICognitive Assessment: Does the patient have evidence of cognitive  impairment? no The patient has no evidence of a change in mood/affect, appearance, speech, memory or motor skills.   Identification of Risk Factors: Risk factors include:  ? PHYSICAL EXAM: BP 137/88 (BP Location: Left Arm, Patient Position: Sitting, Cuff Size: Normal)   Pulse 82   Temp 97.7 F (36.5 C) (Temporal)   Ht '4\' 11"'  (1.499 m)   Wt 169 lb 12.8 oz (77 kg)   LMP 05/29/2019 (Approximate)   SpO2 100%   BMI 34.30 kg/m    General Appearance:    Alert, cooperative, no distress, appears stated age  Head:    Normocephalic, without obvious abnormality, atraumatic  Eyes:    PERRL, conjunctiva/corneas clear, EOM's intact, fundi    benign, both eyes  Ears:    Normal TM's and external ear canals, both ears  Nose:   Nares normal, septum midline, mucosa normal, no drainage    or sinus tenderness  Throat:   Lips, mucosa, and tongue normal; teeth and gums normal  Neck:   Supple, symmetrical, trachea midline, no adenopathy;    thyroid:  no enlargement/tenderness/nodules; no carotid   bruit or JVD  Back:     Symmetric, no curvature, ROM normal, no CVA tenderness  Lungs:     Clear to auscultation bilaterally, respirations unlabored  Chest Wall:    No tenderness or deformity   Heart:    Regular rate and rhythm, S1 and S2 normal, ERDEYC1/4 pansystolic murmur No  present , rubor gallop  Breast Exam:    Deferred   Abdomen:     Soft, non-tender, bowel sounds active all four quadrants,    no masses, no organomegaly  Genitalia:    Normal female without lesion, discharge or tenderness  Rectal:    Normal tone, normal prostate, no masses or tenderness;  guaiac negative stool  Extremities:   Extremities normal, atraumatic, no cyanosis or edema  Pulses:   2+ and symmetric all extremities  Skin:   Skin color, texture, turgor normal, no rashes or lesions  Lymph nodes:   Cervical, supraclavicular, and axillary nodes normal  Neurologic:   CNII-XII intact, normal strength, sensation and reflexes     throughout    ASSESSMENT AND PLAN: Patient Self-Management and Personalized Health Advice The patient has been provided with information about: [place]   During the course of the visit the patient was educated and counseled about appropriate screening and preventive services including:  An after visit summary with all of these plans was given to the patient.  BP 137/88 (BP Location: Left Arm, Patient Position: Sitting, Cuff Size: Normal)   Pulse 82   Temp 97.7 F (36.5 C) (Temporal)   Ht '4\' 11"'  (1.499 m)   Wt 169 lb 12.8 oz (77 kg)   LMP 05/29/2019 (Approximate)   SpO2 100%   BMI 34.30 kg/m   .me Wt Readings from Last 3 Encounters:  06/27/19 169 lb 12.8 oz (77 kg)  12/02/18 162 lb (73.5 kg)  09/19/18 164 lb 3.2 oz (74.5 kg)     Health Maintenance Due  Topic Date Due  . PNEUMOCOCCAL POLYSACCHARIDE VACCINE AGE 60-64 HIGH RISK  02/10/1974  . OPHTHALMOLOGY EXAM  02/10/1982  . FOOT EXAM  01/17/2019    There are no preventive care reminders to display for this patient.  Lab Results  Component Value Date   TSH 2.180 11/14/2016   Lab Results  Component Value Date   WBC 6.1 06/27/2019   HGB 10.8 (L) 06/27/2019   HCT 35.2 06/27/2019   MCV 73 (L) 06/27/2019   PLT 260 06/27/2019   Lab Results  Component Value Date   NA 137 06/27/2019   K 4.7 06/27/2019   CO2 22 06/27/2019   GLUCOSE 75 06/27/2019   BUN 10 06/27/2019   CREATININE 0.83 06/27/2019   BILITOT 0.3 06/27/2019   ALKPHOS 75 06/27/2019   AST 31 06/27/2019   ALT 31 06/27/2019   PROT 7.4 06/27/2019   ALBUMIN 4.3 06/27/2019   CALCIUM 9.6 06/27/2019   ANIONGAP 9 12/02/2018   GFR 105.36 10/27/2014   Lab Results  Component Value Date   CHOL 165 06/27/2019   Lab Results  Component Value Date   HDL 48 06/27/2019   Lab Results  Component Value Date   LDLCALC 89 06/27/2019   Lab Results  Component Value Date   TRIG 165 (H) 06/27/2019   Lab Results  Component Value Date   CHOLHDL  3.4 06/27/2019   Lab Results  Component Value Date   HGBA1C 6.2 (A) 06/27/2019      Assessment & Plan:   Mackenzie Patterson was seen today for follow-up.  Diagnoses and all orders for this visit:  Type 2 diabetes mellitus without complication, without long-term current use of insulin (HCC) -     HgB A1c 6.2 reduced Janumet 50/500 twice daily -     Glucose (CBG) -     CBC with Differential  Controlled type 2 diabetes mellitus without complication, without long-term current use of insulin (HCC) Well controlled goal achieved ADA recommends: Less than 6.5 hemoglobin A1c.  Reference clinical practice recommendations. Foods that are high in carbohydrates are the following rice, potatoes, breads, sugars, and pastas.  Reduction in the intake (eating) will assist in lowering your blood sugars. -     Lipid Panel  Mild intermittent asthma without complication -     budesonide-formoterol (SYMBICORT) 160-4.5 MCG/ACT inhaler; Inhale 2 puffs into the lungs 2 (two) times daily. -     albuterol (VENTOLIN HFA) 108 (90 Base) MCG/ACT inhaler; Inhale 2 puffs into the lungs every 6 (six) hours as needed for wheezing or shortness of breath.  Essential hypertension  Managed by cardiology - [facility] -     lisinopril (ZESTRIL) 20 MG tablet; Take 1 tablet (20 mg total) by mouth daily. -     amLODipine (NORVASC) 5 MG tablet; Take 1 tablet (5 mg total) by mouth daily. -     carvedilol (COREG) 6.25 MG tablet; Take 1 tablet (6.25 mg total) by mouth 2 (two) times daily with a meal. -     CMP14+EGFR  Hypertriglyceridemia Elevated cholesterol that can lead to heart attack and stroke.  -Continue -    atorvastatin (LIPITOR) 20 MG tablet; Take 1 tablet (20 mg total) by mouth daily. -     Lipid Panel  Other orders -     sitaGLIPtin-metformin (JANUMET) 50-500 MG tablet; Take 1 tablet by mouth 2 (two) times daily with a meal. -     ammonium lactate (AMLACTIN) 12 % lotion; Apply 1 application topically as needed for dry  skin.    Meds ordered this encounter  Medications  . sitaGLIPtin-metformin (JANUMET) 50-500 MG tablet    Sig: Take 1 tablet by mouth 2 (two) times daily with a meal.    Dispense:  180 tablet    Refill:  1  . budesonide-formoterol (SYMBICORT) 160-4.5 MCG/ACT inhaler    Sig: Inhale 2 puffs into the lungs 2 (two) times daily.    Dispense:  1 Inhaler    Refill:  5  . albuterol (VENTOLIN HFA) 108 (90 Base) MCG/ACT inhaler    Sig: Inhale 2 puffs into the lungs every 6 (six) hours as needed for wheezing or shortness of breath.    Dispense:  6.7 g    Refill:  1  . lisinopril (ZESTRIL) 20 MG tablet    Sig: Take 1 tablet (20 mg total) by mouth daily.    Dispense:  90 tablet    Refill:  3  . atorvastatin (LIPITOR) 20 MG tablet    Sig: Take 1 tablet (20 mg total) by mouth daily.    Dispense:  90 tablet    Refill:  1  . amLODipine (NORVASC) 5 MG tablet    Sig: Take 1 tablet (5 mg total) by mouth daily.    Dispense:  90 tablet    Refill:  1  . carvedilol (COREG) 6.25 MG tablet    Sig: Take 1 tablet (6.25 mg total) by mouth 2 (two) times daily with a meal.    Dispense:  180 tablet    Refill:  1  . ammonium lactate (AMLACTIN) 12 % lotion    Sig: Apply 1 application topically as needed for dry skin.    Dispense:  400 g    Refill:  1    Follow-up: Return in about 6 months (around 12/25/2019) for General check up.    Kerin Perna, NP

## 2019-07-01 ENCOUNTER — Other Ambulatory Visit (INDEPENDENT_AMBULATORY_CARE_PROVIDER_SITE_OTHER): Payer: Self-pay | Admitting: Primary Care

## 2019-07-01 DIAGNOSIS — D509 Iron deficiency anemia, unspecified: Secondary | ICD-10-CM

## 2019-07-01 MED ORDER — FERROUS SULFATE 325 (65 FE) MG PO TABS: 325.0000 mg | ORAL_TABLET | Freq: Two times a day (BID) | ORAL | 5 refills | Status: DC

## 2019-07-02 ENCOUNTER — Other Ambulatory Visit (INDEPENDENT_AMBULATORY_CARE_PROVIDER_SITE_OTHER): Payer: Self-pay | Admitting: Primary Care

## 2019-07-02 ENCOUNTER — Telehealth: Payer: Self-pay | Admitting: *Deleted

## 2019-07-02 NOTE — Telephone Encounter (Signed)
-----   Message from Antony Blackbird, MD sent at 06/30/2019  3:09 PM EST ----- Continued anemia with hemoglobin of 10.8.  Continue ferrous sulfate and keep upcoming appointment with GYN and continue to follow-up with your PCP.

## 2019-07-02 NOTE — Telephone Encounter (Signed)
.  Medical Assistant left message on patient's home and cell voicemail. Voicemail states to give a call back to [place] with [facility] at [phone]. Patient is aware of being anemic and needing to continue iron supplement and FU with GYN and PCP.

## 2019-07-19 ENCOUNTER — Encounter: Payer: Self-pay | Admitting: General Practice

## 2019-07-19 ENCOUNTER — Ambulatory Visit: Payer: Medicaid Other

## 2019-07-19 ENCOUNTER — Other Ambulatory Visit: Payer: Self-pay

## 2019-07-19 ENCOUNTER — Other Ambulatory Visit (HOSPITAL_COMMUNITY)
Admission: RE | Admit: 2019-07-19 | Discharge: 2019-07-19 | Disposition: A | Discharge status: Home / Self Care | Payer: Medicaid Other | Source: Ambulatory Visit

## 2019-07-19 VITALS — BP 114/77 | HR 93 | Temp 98.4°F | Ht 59.0 in | Wt 164.0 lb

## 2019-07-19 DIAGNOSIS — Z01419 Encounter for gynecological examination (general) (routine) without abnormal findings: Secondary | ICD-10-CM | POA: Diagnosis not present

## 2019-07-19 DIAGNOSIS — Z0000 Encounter for general adult medical examination without abnormal findings: Secondary | ICD-10-CM

## 2019-07-19 DIAGNOSIS — Z113 Encounter for screening for infections with a predominantly sexual mode of transmission: Secondary | ICD-10-CM | POA: Diagnosis not present

## 2019-07-19 DIAGNOSIS — Z1239 Encounter for other screening for malignant neoplasm of breast: Secondary | ICD-10-CM

## 2019-07-19 DIAGNOSIS — F520 Hypoactive sexual desire disorder: Secondary | ICD-10-CM

## 2019-07-19 DIAGNOSIS — N951 Menopausal and female climacteric states: Secondary | ICD-10-CM

## 2019-07-19 NOTE — Progress Notes (Signed)
GYNECOLOGY OFFICE VISIT NOTE-WELL WOMAN EXAM  History:   Mackenzie Patterson [id number] here today for annual well woman exam. She states that she is currently sexually active, but does not utilize birth control.  Patient denies any abnormal vaginal discharge today, but reports that it occurs prior to or immediately after menses. She states the discharge is white with occasional odor, but no itching or burning. Patient reports having only one partner in the past 12 months.  She requests full STD testing today.   Mackenzie Patterson reports that she has been having menses one-two times monthly that lasts 7 days. She reports the flow is heavy with cramping "sometimes," but no clots. She states she does not take any medication for cramping. She denies abnormal bleeding, pelvic pain-prior to menses or other concerns.   Mackenzie Patterson reports that she is "hot all the time" and states it started about 2 years ago. She endorses sleep disturbances due to hot flashes. She also endorses mood swings stating "my children say I am bipolar." She denies vaginal dryness and has a lack of desire regarding sexual activity.   Mackenzie Patterson reports that she completes breast exams at home and reports some nipple leakage of clear fluid with stimulation. However, she states she had a mammogram with normal findings in Nov 2020.  She denies a family history of breast, uterine, cervical, or ovarian cancer.   In regards to other health, Mackenzie Patterson reports that she has a PCP who manages her CHTN and DM.  She endorses exercise in the form of walking daily for at least one hour.  She endorses occasional alcohol usage, but denies tobacco or drug use.  She reports that her nutrition is inadequate stating "my appetite has not been good...it hasn't been right." She states that she didn't have breakfast today and will occassionally skip meals until dinner, while barely drinking water or fluids. Mackenzie Patterson does not work, but reports she had an interview today.  She endorses safety and good social support at home and denies DV/A.     Past Medical History:  Diagnosis Date  . Asthma   . Breast discharge 01/10/2017  . Congenital ventricular septal defect   . Diabetes mellitus without complication ([place])   . Heart murmur   . Hypertension   . Obesity   . Pulmonary hypertension (HCC)    mild   . Tricuspid regurgitation   . VSD (ventricular septal defect), perimembranous    Qp:Qs 1.7:1 by catheterization     Past Surgical History:  Procedure Laterality Date  . CARDIAC CATHETERIZATION [name] 10/30/2014   Procedure: Right Heart Cath;  Surgeon: [name], MD;  Location: [facility] INVASIVE CV LAB CUPID;  Service: Cardiovascular;  Laterality: [name];  . CHOLECYSTECTOMY  2018  . CYST EXCISION    . LEFT AND RIGHT HEART CATHETERIZATION WITH CORONARY ANGIOGRAM [name] 09/11/2013   Procedure: LEFT AND RIGHT HEART CATHETERIZATION WITH CORONARY ANGIOGRAM;  Surgeon: [name], MD;  Location: [facility] CATH LAB;  Service: Cardiovascular;  Laterality: [name];  . RIGHT HEART CATH [name] 09/02/2016   Procedure: Right Heart Cath;  Surgeon: [name], MD;  Location: [place] CV LAB;  Service: Cardiovascular;  Laterality: [name];  . TUBAL LIGATION      The following portions of the patient's history were reviewed and updated as appropriate: allergies, current medications, past family history, past medical history, past social history, past surgical history and problem list.   Health Maintenance:  Normal pap and negative  HRHPV on Nov 2019.  Normal mammogram on Nov 2020.   Review of Systems:  Pertinent items noted in HPI and remainder of comprehensive ROS otherwise negative.    Objective:    Physical Exam BP 114/77 (BP Location: Right Arm, Patient Position: Sitting, Cuff Size: Normal)   Pulse 93   Temp 98.4 F (36.9 C) (Oral)   Ht 4\' 11"  (1.499 m)   Wt 164 lb (74.4 kg)   LMP 06/28/2019 (Approximate)   BMI 33.12 kg/m  Physical Exam Exam conducted with a chaperone present.   Constitutional:      Appearance: Normal appearance.  HENT:     Head: Normocephalic and atraumatic.  Cardiovascular:     Rate and Rhythm: Normal rate. Rhythm regularly irregular.     Heart sounds: Murmur ("Gurgle") present.  Pulmonary:     Effort: Pulmonary effort is normal.     Breath sounds: Normal breath sounds.  Chest:     Breasts:        Right: No mass, nipple discharge or skin change.        Left: No mass, nipple discharge or skin change.     Comments: Tattoo on right inner upper breast  Abdominal:     Palpations: Abdomen is soft.     Tenderness: There is no abdominal tenderness.  Genitourinary:    Labia:        Right: No tenderness.        Left: No tenderness.      Vagina: Vaginal discharge (Small amt thin white discharge. No odor.) present. No tenderness or bleeding.     Cervix: No cervical motion tenderness.     Uterus: Normal. Enlarged (Known fibroids. C/W [redacted]wk GA) and tender (With external palpation).      Comments: CV collected Musculoskeletal:     Cervical back: Normal range of motion.  Lymphadenopathy:     Upper Body:     Right upper body: No axillary adenopathy.     Left upper body: No axillary adenopathy.  Skin:    General: Skin is warm and dry.  Neurological:     Mental Status: She is alert.      Labs and Imaging No results found for this or any previous visit (from the past 168 hour(s)). No results found.   Assessment & Plan:     49 year old Female  1. Well woman exam with routine gynecological exam -Exam findings discussed. -Educated on ASCCP guidelines regarding pap smear evaluation and frequency. -Informed that pap not due today, but pelvic exam with STD testing performed.  -Informed of turnover time and provider/clinic policy on releasing results to mychart. -Encouraged to continue daily exercise.  2. Screening breast examination -CBE completed today and without significance.  -Mammogram reviewed and discussed need for repeat mammogram in  Nov. 2021.  3. Screening for STD (sexually transmitted disease) -Complete testing today -Will release results as appropriate.   4. Perimenopausal symptom -Discussed perimenopausal symptoms and ways to improve them. -Discussed menopause and how estrogen plays a role in the symptoms that present prior to the cessation of menses.  5. Lack of sexual desire -Discussed how perimenopausal symptoms can contribute to lack of sexual desire as well as social and emotional issues. -Encouraged to discuss feelings with partner. -Offered suggestions on ways to improve intimacy within relationship such as date nights.   Routine preventative health maintenance measures emphasized. Please refer to After Visit Summary for other counseling recommendations.   No follow-ups on file.  Maryann Conners, CNM 07/19/2019

## 2019-07-22 LAB — CERVICOVAGINAL ANCILLARY ONLY
Bacterial Vaginitis (gardnerella): POSITIVE — AB
Candida Glabrata: NEGATIVE
Candida Vaginitis: NEGATIVE
Chlamydia: NEGATIVE
Comment: NEGATIVE
Comment: NEGATIVE
Comment: NEGATIVE
Comment: NEGATIVE
Comment: NEGATIVE
Comment: NORMAL
Neisseria Gonorrhea: NEGATIVE
Trichomonas: NEGATIVE

## 2019-07-22 LAB — HEPB+HEPC+HIV PANEL
HIV Screen 4th Generation wRfx: NONREACTIVE
Hep B C IgM: NEGATIVE
Hep B Core Total Ab: NEGATIVE
Hep B E Ab: NEGATIVE
Hep B E Ag: NEGATIVE
Hep B Surface Ab, Qual: NONREACTIVE
Hep C Virus Ab: <0.1 s/co ratio (ref 0.0–0.9)
Hepatitis B Surface Ag: NEGATIVE

## 2019-07-22 LAB — RPR: RPR Ser Ql: NONREACTIVE

## 2019-07-23 ENCOUNTER — Other Ambulatory Visit: Payer: Self-pay

## 2019-07-23 DIAGNOSIS — B9689 Other specified bacterial agents as the cause of diseases classified elsewhere: Secondary | ICD-10-CM

## 2019-07-23 MED ORDER — METRONIDAZOLE 0.75 % VA GEL: 1.0000 | Freq: Every day | VAGINAL | 0 refills | Status: DC

## 2019-07-26 ENCOUNTER — Ambulatory Visit (INDEPENDENT_AMBULATORY_CARE_PROVIDER_SITE_OTHER): Payer: Medicaid Other | Admitting: Primary Care

## 2019-07-26 ENCOUNTER — Encounter (INDEPENDENT_AMBULATORY_CARE_PROVIDER_SITE_OTHER): Payer: Self-pay | Admitting: Primary Care

## 2019-07-26 ENCOUNTER — Other Ambulatory Visit: Payer: Self-pay

## 2019-07-26 VITALS — BP 114/68 | HR 90 | Temp 97.3°F | Ht 59.0 in | Wt 163.8 lb

## 2019-07-26 DIAGNOSIS — R21 Rash and other nonspecific skin eruption: Secondary | ICD-10-CM | POA: Diagnosis not present

## 2019-07-26 DIAGNOSIS — R05 Cough: Secondary | ICD-10-CM | POA: Diagnosis not present

## 2019-07-26 DIAGNOSIS — Z20828 Contact with and (suspected) exposure to other viral communicable diseases: Secondary | ICD-10-CM | POA: Diagnosis not present

## 2019-07-26 DIAGNOSIS — E119 Type 2 diabetes mellitus without complications: Secondary | ICD-10-CM

## 2019-07-26 DIAGNOSIS — R059 Cough, unspecified: Secondary | ICD-10-CM

## 2019-07-26 LAB — GLUCOSE, POCT (MANUAL RESULT ENTRY): POC Glucose: 99 mg/dl (ref 70–99)

## 2019-07-26 MED ORDER — ACCU-CHEK FASTCLIX LANCETS MISC: 11 refills | Status: AC

## 2019-07-26 MED ORDER — TRIAMCINOLONE ACETONIDE 0.1 % EX CREA: 1.0000 "application " | TOPICAL_CREAM | Freq: Two times a day (BID) | CUTANEOUS | 0 refills | Status: DC

## 2019-07-26 MED ORDER — BENZONATATE 100 MG PO CAPS: 100.0000 mg | ORAL_CAPSULE | Freq: Two times a day (BID) | ORAL | 0 refills | Status: DC | PRN

## 2019-07-26 MED ORDER — ACCU-CHEK GUIDE VI STRP: ORAL_STRIP | 11 refills | Status: DC

## 2019-07-26 NOTE — Progress Notes (Signed)
Established Patient Office Visit  Subjective:  [name], female    DOB: 12-19-1970  Age: 49 y.o. MRN: [id number]  CC:  Chief Complaint  Patient presents with  . breakout    HPI Mackenzie Patterson presents for a rash that is a contact dermatitis this started with wearing the mask but the cloth mask does not bother her as much. Mask or COVID  Past Medical History:  Diagnosis Date  . Asthma   . Breast discharge 01/10/2017  . Congenital ventricular septal defect   . Diabetes mellitus without complication ([place])   . Heart murmur   . Hypertension   . Obesity   . Pulmonary hypertension (HCC)    mild   . Tricuspid regurgitation   . VSD (ventricular septal defect), perimembranous    Qp:Qs 1.7:1 by catheterization     Past Surgical History:  Procedure Laterality Date  . CARDIAC CATHETERIZATION [name] 10/30/2014   Procedure: Right Heart Cath;  Surgeon: [name], MD;  Location: [facility] INVASIVE CV LAB CUPID;  Service: Cardiovascular;  Laterality: [name];  . CHOLECYSTECTOMY  2018  . CYST EXCISION    . LEFT AND RIGHT HEART CATHETERIZATION WITH CORONARY ANGIOGRAM [name] 09/11/2013   Procedure: LEFT AND RIGHT HEART CATHETERIZATION WITH CORONARY ANGIOGRAM;  Surgeon: [name], MD;  Location: [facility] CATH LAB;  Service: Cardiovascular;  Laterality: [name];  . RIGHT HEART CATH [name] 09/02/2016   Procedure: Right Heart Cath;  Surgeon: [name], MD;  Location: [facility] CV LAB;  Service: Cardiovascular;  Laterality: [name];  . TUBAL LIGATION      Family History  Problem Relation Age of Onset  . Cancer Other   . Hyperlipidemia Other   . Hypertension Other   . Asthma Other   . Diabetes Mother   . Diabetes Father     Social History   Socioeconomic History  . Marital status: Single    Spouse name: Not on file  . Number of children: Not on file  . Years of education: Not on file  . Highest education level: Not on file  Occupational History  . Not on file  Tobacco Use  . Smoking  status: Never Smoker  . Smokeless tobacco: Never Used  Substance and Sexual Activity  . Alcohol use: No  . Drug use: No  . Sexual activity: Yes    Birth control/protection: Surgical  Other Topics Concern  . Not on file  Social History Narrative   Lives with boyfriend. On disability.    Social Determinants of Health   Financial Resource Strain:   . Difficulty of Paying Living Expenses: Not on file  Food Insecurity:   . Worried About Charity fundraiser in the Last Year: Not on file  . Ran Out of Food in the Last Year: Not on file  Transportation Needs:   . Lack of Transportation (Medical): Not on file  . Lack of Transportation (Non-Medical): Not on file  Physical Activity:   . Days of Exercise per Week: Not on file  . Minutes of Exercise per Session: Not on file  Stress:   . Feeling of Stress : Not on file  Social Connections:   . Frequency of Communication with Friends and Family: Not on file  . Frequency of Social Gatherings with Friends and Family: Not on file  . Attends Religious Services: Not on file  . Active Member of Clubs or Organizations: Not on file  . Attends Archivist Meetings: Not on  file  . Marital Status: Not on file  Intimate Partner Violence:   . Fear of Current or Ex-Partner: Not on file  . Emotionally Abused: Not on file  . Physically Abused: Not on file  . Sexually Abused: Not on file    Outpatient Medications Prior to Visit  Medication Sig Dispense Refill  . Accu-Chek FastClix Lancets MISC USE 2 TIMES DAILY AS DIRECTED 100 each 1  . albuterol (VENTOLIN HFA) 108 (90 Base) MCG/ACT inhaler Inhale 2 puffs into the lungs every 6 (six) hours as needed for wheezing or shortness of breath. 6.7 g 1  . amLODipine (NORVASC) 5 MG tablet Take 1 tablet (5 mg total) by mouth daily. 90 tablet 1  . ammonium lactate (AMLACTIN) 12 % lotion Apply 1 application topically as needed for dry skin. 400 g 1  . aspirin EC 81 MG tablet Take 1 tablet (81 mg total) by  mouth daily. 90 tablet 3  . atorvastatin (LIPITOR) 20 MG tablet Take 1 tablet (20 mg total) by mouth daily. 90 tablet 1  . budesonide-formoterol (SYMBICORT) 160-4.5 MCG/ACT inhaler Inhale 2 puffs into the lungs 2 (two) times daily. 1 Inhaler 5  . carvedilol (COREG) 6.25 MG tablet Take 1 tablet (6.25 mg total) by mouth 2 (two) times daily with a meal. 180 tablet 1  . cetirizine (ZYRTEC) 10 MG tablet Take 1 tablet (10 mg total) by mouth daily. At bedtime as needed 30 tablet 11  . ferrous sulfate 325 (65 FE) MG tablet Take 1 tablet (325 mg total) by mouth 2 (two) times daily with a meal. To treat anemia 60 tablet 5  . glucose blood (ACCU-CHEK GUIDE) test strip USE 2 TIMES DAILY AS DIRECFTED 70 each 5  . lisinopril (ZESTRIL) 20 MG tablet Take 1 tablet (20 mg total) by mouth daily. 90 tablet 3  . metroNIDAZOLE (METROGEL VAGINAL) 0.75 % vaginal gel Place 1 Applicatorful vaginally at bedtime. Insert one applicator, at bedtime, for 5 nights. 70 g 0  . pantoprazole (PROTONIX) 20 MG tablet Take 1 tablet (20 mg total) by mouth daily. For stomach protection 30 tablet 11  . sitaGLIPtin-metformin (JANUMET) 50-500 MG tablet Take 1 tablet by mouth 2 (two) times daily with a meal. 180 tablet 1   No facility-administered medications prior to visit.    Allergies  Allergen Reactions  . Penicillins Anaphylaxis    Has patient had a PCN reaction causing immediate rash, facial/tongue/throat swelling, SOB or lightheadedness with hypotension: Yes Has patient had a PCN reaction causing severe rash involving mucus membranes or skin necrosis: Yes Has patient had a PCN reaction that required hospitalization Yes- went to ED, was not admitted Has patient had a PCN reaction occurring within the last 10 years: No- longer then 10 years If all of the above answers are "NO", then may proceed with Cephalosporin use.   . Tape Rash    Other reaction(s): Other (See Comments) Burns skin  . Other Rash    Other reaction(s): Other  (See Comments) **EKG GEL PADS**  "Burns my skin" Pt states she is allergic to a blood pressure medication, unsure.  **EKG GEL PADS**  "Burns my skin"  . Latex Rash    ROS Review of Systems  Respiratory: Positive for cough.   Skin:       Stomach itching   All other systems reviewed and are negative.     Objective:    Physical Exam  Constitutional: She is oriented to person, place, and time. She appears well-developed  and well-nourished.  HENT:  Head: Normocephalic.  Cardiovascular: Normal rate and regular rhythm.  Murmur heard. Pulmonary/Chest: Effort normal and breath sounds normal.  Abdominal: Soft. Bowel sounds are normal.  Musculoskeletal:     Cervical back: Neck supple.  Neurological: She is oriented to person, place, and time.  Skin:  Left side of abdomen raised redden area 2x2 in size  Psychiatric: She has a normal mood and affect. Her behavior is normal. Judgment and thought content normal.    BP 114/68 (BP Location: Left Arm, Patient Position: Sitting, Cuff Size: Normal)   Pulse 90   Temp (!) 97.3 F (36.3 C) (Temporal)   Ht 4\' 11"  (1.499 m)   Wt 163 lb 12.8 oz (74.3 kg)   LMP 06/28/2019 (Approximate)   SpO2 100%   BMI 33.08 kg/m  Wt Readings from Last 3 Encounters:  07/26/19 163 lb 12.8 oz (74.3 kg)  07/19/19 164 lb (74.4 kg)  06/27/19 169 lb 12.8 oz (77 kg)     Health Maintenance Due  Topic Date Due  . PNEUMOCOCCAL POLYSACCHARIDE VACCINE AGE 30-64 HIGH RISK  02/10/1974  . OPHTHALMOLOGY EXAM  02/10/1982    There are no preventive care reminders to display for this patient.  Lab Results  Component Value Date   TSH 2.180 11/14/2016   Lab Results  Component Value Date   WBC 6.1 06/27/2019   HGB 10.8 (L) 06/27/2019   HCT 35.2 06/27/2019   MCV 73 (L) 06/27/2019   PLT 260 06/27/2019   Lab Results  Component Value Date   NA 137 06/27/2019   K 4.7 06/27/2019   CO2 22 06/27/2019   GLUCOSE 75 06/27/2019   BUN 10 06/27/2019   CREATININE  0.83 06/27/2019   BILITOT 0.3 06/27/2019   ALKPHOS 75 06/27/2019   AST 31 06/27/2019   ALT 31 06/27/2019   PROT 7.4 06/27/2019   ALBUMIN 4.3 06/27/2019   CALCIUM 9.6 06/27/2019   ANIONGAP 9 12/02/2018   GFR 105.36 10/27/2014   Lab Results  Component Value Date   CHOL 165 06/27/2019   Lab Results  Component Value Date   HDL 48 06/27/2019   Lab Results  Component Value Date   LDLCALC 89 06/27/2019   Lab Results  Component Value Date   TRIG 165 (H) 06/27/2019   Lab Results  Component Value Date   CHOLHDL 3.4 06/27/2019   Lab Results  Component Value Date   HGBA1C 6.2 (A) 06/27/2019      Assessment & Plan:  Anabella was seen today for breakout.  Diagnoses and all orders for this visit:  Type 2 diabetes mellitus without complication, without long-term current use of insulin ([place]) Well controlled last A1C 6.2  As defined by ADA.  Continue to eat foods that are high in carbohydrates are the following rice, potatoes, breads, sugars, and pastas.  Reduction in the intake (eating) will assist in lowering your blood sugars. -     Glucose (CBG)  Rash and nonspecific skin eruption Rash on nose, cheeks and chin from from fibers in mask she has found cloth mask cause less irritation. Gave option COVID or mask and find a kind that will not cause irritation. Rash on her abdomen red, raised not warm to touch but pruritis.  Prescribe triamcinolone do not use on face   Encounter for diabetic foot exam ([place]) Sensory exam of the foot is normal, tested with the monofilament. Good pulses, no lesions or ulcers, good peripheral pulses.  Cough Dry non productive  prescribed tessalon pears. Also mask can be underlying cause of cough .monitor     No orders of the defined types were placed in this encounter.   Follow-up: Return in about 6 months (around 01/23/2020) for Diabetes tele.    Kerin Perna, NP

## 2019-07-26 NOTE — Patient Instructions (Signed)

## 2019-07-30 ENCOUNTER — Other Ambulatory Visit (INDEPENDENT_AMBULATORY_CARE_PROVIDER_SITE_OTHER): Payer: Self-pay | Admitting: Primary Care

## 2019-07-30 MED ORDER — CVS GLUCOSE METER TEST STRIPS VI STRP: ORAL_STRIP | 12 refills | Status: AC

## 2019-08-03 ENCOUNTER — Inpatient Hospital Stay (HOSPITAL_COMMUNITY): Payer: Medicaid Other

## 2019-08-03 ENCOUNTER — Encounter (HOSPITAL_COMMUNITY): Payer: Self-pay

## 2019-08-03 ENCOUNTER — Inpatient Hospital Stay (HOSPITAL_COMMUNITY)
Admission: EM | Admit: 2019-08-03 | Discharge: 2019-08-04 | DRG: 069 | Disposition: A | Discharge status: Home / Self Care | Payer: Medicaid Other | Attending: Internal Medicine | Admitting: Internal Medicine

## 2019-08-03 ENCOUNTER — Other Ambulatory Visit: Payer: Self-pay

## 2019-08-03 DIAGNOSIS — Z7984 Long term (current) use of oral hypoglycemic drugs: Secondary | ICD-10-CM

## 2019-08-03 DIAGNOSIS — R011 Cardiac murmur, unspecified: Secondary | ICD-10-CM | POA: Diagnosis not present

## 2019-08-03 DIAGNOSIS — Z8249 Family history of ischemic heart disease and other diseases of the circulatory system: Secondary | ICD-10-CM

## 2019-08-03 DIAGNOSIS — R4781 Slurred speech: Secondary | ICD-10-CM | POA: Diagnosis present

## 2019-08-03 DIAGNOSIS — Q210 Ventricular septal defect: Secondary | ICD-10-CM

## 2019-08-03 DIAGNOSIS — Z9049 Acquired absence of other specified parts of digestive tract: Secondary | ICD-10-CM | POA: Diagnosis not present

## 2019-08-03 DIAGNOSIS — I272 Pulmonary hypertension, unspecified: Secondary | ICD-10-CM | POA: Diagnosis present

## 2019-08-03 DIAGNOSIS — R4701 Aphasia: Secondary | ICD-10-CM | POA: Diagnosis present

## 2019-08-03 DIAGNOSIS — J45909 Unspecified asthma, uncomplicated: Secondary | ICD-10-CM | POA: Diagnosis present

## 2019-08-03 DIAGNOSIS — R000 Tachycardia, unspecified: Secondary | ICD-10-CM | POA: Diagnosis not present

## 2019-08-03 DIAGNOSIS — E669 Obesity, unspecified: Secondary | ICD-10-CM | POA: Diagnosis present

## 2019-08-03 DIAGNOSIS — Z888 Allergy status to other drugs, medicaments and biological substances status: Secondary | ICD-10-CM | POA: Diagnosis not present

## 2019-08-03 DIAGNOSIS — G459 Transient cerebral ischemic attack, unspecified: Secondary | ICD-10-CM | POA: Diagnosis not present

## 2019-08-03 DIAGNOSIS — Z91048 Other nonmedicinal substance allergy status: Secondary | ICD-10-CM | POA: Diagnosis not present

## 2019-08-03 DIAGNOSIS — I10 Essential (primary) hypertension: Secondary | ICD-10-CM | POA: Diagnosis present

## 2019-08-03 DIAGNOSIS — Z8673 Personal history of transient ischemic attack (TIA), and cerebral infarction without residual deficits: Secondary | ICD-10-CM | POA: Diagnosis not present

## 2019-08-03 DIAGNOSIS — U071 COVID-19: Secondary | ICD-10-CM | POA: Diagnosis not present

## 2019-08-03 DIAGNOSIS — Z6833 Body mass index (BMI) 33.0-33.9, adult: Secondary | ICD-10-CM | POA: Diagnosis not present

## 2019-08-03 DIAGNOSIS — K219 Gastro-esophageal reflux disease without esophagitis: Secondary | ICD-10-CM | POA: Diagnosis not present

## 2019-08-03 DIAGNOSIS — I361 Nonrheumatic tricuspid (valve) insufficiency: Secondary | ICD-10-CM | POA: Diagnosis not present

## 2019-08-03 DIAGNOSIS — R29810 Facial weakness: Secondary | ICD-10-CM | POA: Diagnosis present

## 2019-08-03 DIAGNOSIS — Z91040 Latex allergy status: Secondary | ICD-10-CM

## 2019-08-03 DIAGNOSIS — Z823 Family history of stroke: Secondary | ICD-10-CM

## 2019-08-03 DIAGNOSIS — E119 Type 2 diabetes mellitus without complications: Secondary | ICD-10-CM | POA: Diagnosis not present

## 2019-08-03 DIAGNOSIS — G8194 Hemiplegia, unspecified affecting left nondominant side: Secondary | ICD-10-CM | POA: Diagnosis present

## 2019-08-03 DIAGNOSIS — I071 Rheumatic tricuspid insufficiency: Secondary | ICD-10-CM | POA: Diagnosis present

## 2019-08-03 DIAGNOSIS — Z833 Family history of diabetes mellitus: Secondary | ICD-10-CM

## 2019-08-03 DIAGNOSIS — Z880 Allergy status to penicillin: Secondary | ICD-10-CM | POA: Diagnosis not present

## 2019-08-03 DIAGNOSIS — D649 Anemia, unspecified: Secondary | ICD-10-CM | POA: Diagnosis present

## 2019-08-03 DIAGNOSIS — Z7982 Long term (current) use of aspirin: Secondary | ICD-10-CM

## 2019-08-03 DIAGNOSIS — R42 Dizziness and giddiness: Secondary | ICD-10-CM | POA: Diagnosis not present

## 2019-08-03 DIAGNOSIS — Z7951 Long term (current) use of inhaled steroids: Secondary | ICD-10-CM

## 2019-08-03 DIAGNOSIS — Z79899 Other long term (current) drug therapy: Secondary | ICD-10-CM

## 2019-08-03 DIAGNOSIS — E781 Pure hyperglyceridemia: Secondary | ICD-10-CM

## 2019-08-03 DIAGNOSIS — Z825 Family history of asthma and other chronic lower respiratory diseases: Secondary | ICD-10-CM

## 2019-08-03 LAB — DIFFERENTIAL
Abs Immature Granulocytes: 0.02 10*3/uL (ref 0.00–0.07)
Basophils Absolute: 0 10*3/uL (ref 0.0–0.1)
Basophils Relative: 0 %
Eosinophils Absolute: 0.1 10*3/uL (ref 0.0–0.5)
Eosinophils Relative: 1 %
Immature Granulocytes: 0 %
Lymphocytes Relative: 24 %
Lymphs Abs: 1.3 10*3/uL (ref 0.7–4.0)
Monocytes Absolute: 0.8 10*3/uL (ref 0.1–1.0)
Monocytes Relative: 16 %
Neutro Abs: 3 10*3/uL (ref 1.7–7.7)
Neutrophils Relative %: 59 %

## 2019-08-03 LAB — CBC
HCT: 35.2 % — ABNORMAL LOW (ref 36.0–46.0)
Hemoglobin: 10.4 g/dL — ABNORMAL LOW (ref 12.0–15.0)
MCH: 22.7 pg — ABNORMAL LOW (ref 26.0–34.0)
MCHC: 29.5 g/dL — ABNORMAL LOW (ref 30.0–36.0)
MCV: 76.9 fL — ABNORMAL LOW (ref 80.0–100.0)
Platelets: 349 10*3/uL (ref 150–400)
RBC: 4.58 MIL/uL (ref 3.87–5.11)
RDW: 17.9 % — ABNORMAL HIGH (ref 11.5–15.5)
WBC: 5.2 10*3/uL (ref 4.0–10.5)
nRBC: 0 % (ref 0.0–0.2)

## 2019-08-03 LAB — COMPREHENSIVE METABOLIC PANEL
ALT: 36 U/L (ref 0–44)
AST: 43 U/L — ABNORMAL HIGH (ref 15–41)
Albumin: 3.6 g/dL (ref 3.5–5.0)
Alkaline Phosphatase: 51 U/L (ref 38–126)
Anion gap: 12 (ref 5–15)
BUN: <5 mg/dL — ABNORMAL LOW (ref 6–20)
CO2: 21 mmol/L — ABNORMAL LOW (ref 22–32)
Calcium: 9.1 mg/dL (ref 8.9–10.3)
Chloride: 105 mmol/L (ref 98–111)
Creatinine, Ser: 0.82 mg/dL (ref 0.44–1.00)
GFR calc Af Amer: >60 mL/min (ref >60)
GFR calc non Af Amer: >60 mL/min (ref >60)
Glucose, Bld: 96 mg/dL (ref 70–99)
Potassium: 4.1 mmol/L (ref 3.5–5.1)
Sodium: 138 mmol/L (ref 135–145)
Total Bilirubin: 0.6 mg/dL (ref 0.3–1.2)
Total Protein: 6.9 g/dL (ref 6.5–8.1)

## 2019-08-03 LAB — CBG MONITORING, ED: Glucose-Capillary: 94 mg/dL (ref 70–99)

## 2019-08-03 LAB — PROTIME-INR
INR: 1.1 (ref 0.8–1.2)
Prothrombin Time: 13.9 seconds (ref 11.4–15.2)

## 2019-08-03 LAB — RAPID URINE DRUG SCREEN, HOSP PERFORMED
Amphetamines: NOT DETECTED
Barbiturates: NOT DETECTED
Benzodiazepines: NOT DETECTED
Cocaine: NOT DETECTED
Opiates: NOT DETECTED
Tetrahydrocannabinol: NOT DETECTED

## 2019-08-03 LAB — URINALYSIS, ROUTINE W REFLEX MICROSCOPIC
Bilirubin Urine: NEGATIVE
Glucose, UA: NEGATIVE mg/dL
Hgb urine dipstick: NEGATIVE
Ketones, ur: NEGATIVE mg/dL
Leukocytes,Ua: NEGATIVE
Nitrite: NEGATIVE
Protein, ur: NEGATIVE mg/dL
Specific Gravity, Urine: 1.006 (ref 1.005–1.030)
pH: 6 (ref 5.0–8.0)

## 2019-08-03 LAB — RESPIRATORY PANEL BY RT PCR (FLU A&B, COVID)
Influenza A by PCR: NEGATIVE
Influenza B by PCR: NEGATIVE
SARS Coronavirus 2 by RT PCR: POSITIVE — AB

## 2019-08-03 LAB — APTT: aPTT: 33 seconds (ref 24–36)

## 2019-08-03 LAB — I-STAT BETA HCG BLOOD, ED (MC, WL, AP ONLY): I-stat hCG, quantitative: <5 m[IU]/mL (ref <5)

## 2019-08-03 LAB — ETHANOL: Alcohol, Ethyl (B): <10 mg/dL (ref <10)

## 2019-08-03 MED ORDER — INSULIN ASPART 100 UNIT/ML ~~LOC~~ SOLN
0.0000 [IU] | Freq: Three times a day (TID) | SUBCUTANEOUS | Status: DC
  Administered 2019-08-04: 09:00:00 2 [IU] via SUBCUTANEOUS

## 2019-08-03 MED ORDER — INSULIN ASPART 100 UNIT/ML ~~LOC~~ SOLN: 0.0000 [IU] | Freq: Every day | SUBCUTANEOUS | Status: DC

## 2019-08-03 MED ORDER — SENNOSIDES-DOCUSATE SODIUM 8.6-50 MG PO TABS: 1.0000 | ORAL_TABLET | Freq: Every evening | ORAL | Status: DC | PRN

## 2019-08-03 MED ORDER — ENOXAPARIN SODIUM 40 MG/0.4ML ~~LOC~~ SOLN
40.0000 mg | SUBCUTANEOUS | Status: DC
  Administered 2019-08-03: 19:00:00 40 mg via SUBCUTANEOUS
  Filled 2019-08-03 (×2): qty 0.4

## 2019-08-03 MED ORDER — PROMETHAZINE HCL 25 MG PO TABS: 12.5000 mg | ORAL_TABLET | Freq: Four times a day (QID) | ORAL | Status: DC | PRN

## 2019-08-03 MED ORDER — ACETAMINOPHEN 325 MG PO TABS
650.0000 mg | ORAL_TABLET | Freq: Four times a day (QID) | ORAL | Status: DC | PRN
  Administered 2019-08-04: 650 mg via ORAL
  Filled 2019-08-03: qty 2

## 2019-08-03 MED ORDER — ACETAMINOPHEN 650 MG RE SUPP: 650.0000 mg | Freq: Four times a day (QID) | RECTAL | Status: DC | PRN

## 2019-08-03 MED ORDER — ASPIRIN EC 81 MG PO TBEC
81.0000 mg | DELAYED_RELEASE_TABLET | Freq: Every day | ORAL | Status: DC
  Administered 2019-08-04: 09:00:00 81 mg via ORAL
  Filled 2019-08-03 (×2): qty 1

## 2019-08-03 MED ORDER — PANTOPRAZOLE SODIUM 20 MG PO TBEC
20.0000 mg | DELAYED_RELEASE_TABLET | Freq: Every day | ORAL | Status: DC
  Administered 2019-08-04: 09:00:00 20 mg via ORAL
  Filled 2019-08-03 (×2): qty 1

## 2019-08-03 MED ORDER — GADOBUTROL 1 MMOL/ML IV SOLN
7.5000 mL | Freq: Once | INTRAVENOUS | Status: AC | PRN
  Administered 2019-08-03: 7.5 mL via INTRAVENOUS

## 2019-08-03 MED ORDER — ALBUTEROL SULFATE HFA 108 (90 BASE) MCG/ACT IN AERS
2.0000 | INHALATION_SPRAY | Freq: Four times a day (QID) | RESPIRATORY_TRACT | Status: DC | PRN
  Filled 2019-08-03: qty 6.7

## 2019-08-03 MED ORDER — CLOPIDOGREL BISULFATE 75 MG PO TABS
75.0000 mg | ORAL_TABLET | Freq: Every day | ORAL | Status: DC
  Administered 2019-08-04: 09:00:00 75 mg via ORAL
  Filled 2019-08-03: qty 1

## 2019-08-03 MED ORDER — LORAZEPAM 2 MG/ML IJ SOLN
1.0000 mg | Freq: Once | INTRAMUSCULAR | Status: AC | PRN
  Administered 2019-08-03: 22:00:00 1 mg via INTRAVENOUS
  Filled 2019-08-03: qty 1

## 2019-08-03 MED ORDER — CLOPIDOGREL BISULFATE 75 MG PO TABS
300.0000 mg | ORAL_TABLET | Freq: Once | ORAL | Status: AC
  Administered 2019-08-04: 02:00:00 300 mg via ORAL
  Filled 2019-08-03: qty 4

## 2019-08-03 NOTE — ED Triage Notes (Signed)
Pt bib [facility] from home w/ c/o L sided weakness, slurred speech, and aphasia. LKW 1120 today. Symptoms have since resolved. Pt had similar episode in 11/2018 w/ same symptoms and was diagnosed with TIA. Pt tested positive for covid on 07/24/2018, all covid symptoms have resolved. EMS VSS  CBG 119 HR 110 BP 144/83 SPO2 99% on RA RR 20

## 2019-08-03 NOTE — Consult Note (Signed)
Neurology Consultation Reason for Consult: Numbness on the left Referring Physician: Daryll Drown, E  CC: Left-sided numbness  History is obtained from: Patient  HPI: Forever Fuente is a 49 y.o. female with a history of pulmonary hypertension, hypertension, diabetes, heart murmur who presents with left-sided numbness and weakness that lasted for approximately 15 minutes.  She stated that initially she noticed some tingling in her left hand which then progressed to left-sided numbness including her arm and leg.  This then gradually improved over the course of 15 minutes.  She has had 4 of these episodes before, all relatively stereotyped with tingling in her left arm followed by weakness of the left side.  She denies any headache or any history of migraines.  She denies any visual change.   ROS: A 14 point ROS was performed and is negative except as noted in the HPI.   Past Medical History:  Diagnosis Date  . Asthma   . Breast discharge 01/10/2017  . Congenital ventricular septal defect   . Diabetes mellitus without complication ([place])   . Heart murmur   . Hypertension   . Obesity   . Pulmonary hypertension (HCC)    mild   . Tricuspid regurgitation   . VSD (ventricular septal defect), perimembranous    Qp:Qs 1.7:1 by catheterization      Family History  Problem Relation Age of Onset  . Cancer Other   . Hyperlipidemia Other   . Hypertension Other   . Asthma Other   . Diabetes Mother   . Diabetes Father      Social History:  reports that she has never smoked. She has never used smokeless tobacco. She reports that she does not drink alcohol or use drugs.   Exam: Current vital signs: BP 125/79   Pulse 87   Temp 98 F (36.7 C) (Oral)   Resp 16   Ht 4\' 11"  (1.499 m)   Wt 74.8 kg   SpO2 98%   BMI 33.33 kg/m  Vital signs in last 24 hours: Temp:  [98 F (36.7 C)-99.4 F (37.4 C)] 98 F (36.7 C) (02/06 1747) Pulse Rate:  [87-101] 87 (02/06 1747) Resp:  [15-26] 16 (02/06  2100) BP: (114-143)/(69-90) 125/79 (02/06 2100) SpO2:  [97 %-100 %] 98 % (02/06 2100) Weight:  [74.8 kg] 74.8 kg (02/06 1254)   Physical Exam  Constitutional: Appears well-developed and well-nourished.  Psych: Affect appropriate to situation Eyes: No scleral injection HENT: No OP obstrucion MSK: no joint deformities.  Cardiovascular: Normal rate and regular rhythm.  Respiratory: Effort normal, non-labored breathing GI: Soft.  No distension. There is no tenderness.  Skin: WDI  Neuro: Mental Status: Patient is awake, alert, oriented to person, place, month, year, and situation. Patient is able to give a clear and coherent history. No signs of aphasia or neglect Cranial Nerves: II: Visual Fields are full. Pupils are equal, round, and reactive to light.   III,IV, VI: EOMI without ptosis or diploplia.  V: Facial sensation is symmetric to temperature VII: Facial movement is symmetric.  VIII: hearing is intact to voice X: Uvula elevates symmetrically XI: Shoulder shrug is symmetric. XII: tongue is midline without atrophy or fasciculations.  Motor: Tone is normal. Bulk is normal. 5/5 strength was present in all four extremities.  Sensory: Sensation is symmetric to light touch and temperature in the arms and legs. Cerebellar: FNF and HKS are intact bilaterally  I have reviewed labs in epic and the results pertinent to this consultation are: Coronavirus-positive CMP-unremarkable  I have reviewed the images obtained: CT head-negative  Impression: 49 year old female presenting with recurrent left-sided numbness weakness.  The stereotyped nature in fact that she has had 5 episodes without actual infarct do raise the possibility that this may represent something other than TIA.  There is no symptom or sign concerning for seizure activity, but I think an EEG would be reasonable.  She does not have any headache associated with it, but the tingling and spread could be suggestive of  migraine aura.   Finally, given that she has multiple risk factors (ABCD2 of 5) I think in the absence of other convincing data that this should be treated as TIA.  Recommendations: - HgbA1c, fasting lipid panel - MRI, MRA  of the brain without contrast, MRA neck w/ contrast - Frequent neuro checks - Echocardiogram - Prophylactic therapy-Antiplatelet med: Aspirin - 81mg  daily and plavix 75mg  daily after 300mg  load x 3 weeks - Risk factor modification - Telemetry monitoring - PT consult, OT consult, Speech consult - EEG - Stroke team to follow   Roland Rack, MD [facility] [phone]  If 7pm- 7am, please page neurology on call as listed in [place].

## 2019-08-03 NOTE — ED Provider Notes (Signed)
[facility] EMERGENCY DEPARTMENT Provider Note   CSN: [id number] Arrival date & time: 08/03/19  1236     History Chief Complaint  Patient presents with  . Transient Ischemic Attack    Mackenzie Patterson is a 49 y.o. female.  She has a history of VSD, diabetes, stroke.  She is complaining of acute onset of left arm clumsiness along with slurred speech and aphasia at 1120 today.  She says her symptoms lasted about 15 minutes and completely resolved.  Similar symptoms last summer.  MRI at that time showed old stroke but no acute findings.  Diagnosed with Covid about 10 days ago after having close contact with somebody who was positive.  She had no symptoms.  She said she has been taking her medicine regularly.  No fevers or chills.  The history is provided by the patient.  Cerebrovascular Accident This is a new problem. The current episode started 1 to 2 hours ago. The problem occurs rarely. The problem has been resolved. Pertinent negatives include no chest pain, no abdominal pain, no headaches and no shortness of breath. Nothing aggravates the symptoms. Nothing relieves the symptoms. She has tried nothing for the symptoms. The treatment provided significant relief.       Past Medical History:  Diagnosis Date  . Asthma   . Breast discharge 01/10/2017  . Congenital ventricular septal defect   . Diabetes mellitus without complication ([place])   . Heart murmur   . Hypertension   . Obesity   . Pulmonary hypertension (HCC)    mild   . Tricuspid regurgitation   . VSD (ventricular septal defect), perimembranous    Qp:Qs 1.7:1 by catheterization     Patient Active Problem List   Diagnosis Date Noted  . Adenomyosis 08/14/2018  . Abnormal uterine bleeding (AUB) 08/14/2018  . Fibroids 05/10/2018  . 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 TIA (transient ischemic attack) 10/24/2017  . Tricuspid regurgitation   . Asthma   . VSD (ventricular septal defect),  perimembranous 09/03/2013  . Pulmonary hypertension ([place]) 09/03/2013  . Essential hypertension 08/06/2013  . Diabetes mellitus ([place]) 08/06/2013  . Obesity 08/06/2013  . Hypertension 07/27/2012  . 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 Perimembranous ventricular septal defect 07/27/2012    Past Surgical History:  Procedure Laterality Date  . CARDIAC CATHETERIZATION [name] 10/30/2014   Procedure: Right Heart Cath;  Surgeon: [name], MD;  Location: [facility] INVASIVE CV LAB CUPID;  Service: Cardiovascular;  Laterality: [name];  . CHOLECYSTECTOMY  2018  . CYST EXCISION    . LEFT AND RIGHT HEART CATHETERIZATION WITH CORONARY ANGIOGRAM [name] 09/11/2013   Procedure: LEFT AND RIGHT HEART CATHETERIZATION WITH CORONARY ANGIOGRAM;  Surgeon: [name], MD;  Location: [facility] CATH LAB;  Service: Cardiovascular;  Laterality: [name];  . RIGHT HEART CATH [name] 09/02/2016   Procedure: Right Heart Cath;  Surgeon: [name], MD;  Location: [facility] CV LAB;  Service: Cardiovascular;  Laterality: [name];  . TUBAL LIGATION       OB History    Gravida  3   Para  2   Term  1   Preterm  1   AB  1   Living  3     SAB  0   TAB  1   Ectopic  0   Multiple  1   Live Births  3           Family History  Problem Relation Age of Onset  .  Cancer Other   . Hyperlipidemia Other   . Hypertension Other   . Asthma Other   . Diabetes Mother   . Diabetes Father     Social History   Tobacco Use  . Smoking status: Never Smoker  . Smokeless tobacco: Never Used  Substance Use Topics  . Alcohol use: No  . Drug use: No    Home Medications Prior to Admission medications   Medication Sig Start Date End Date Taking? Authorizing Provider  Accu-Chek FastClix Lancets MISC USE 2 TIMES DAILY AS DIRECTED 07/26/19   Kerin Perna, NP  albuterol (VENTOLIN HFA) 108 (90 Base) MCG/ACT inhaler Inhale 2 puffs into the lungs every 6 (six) hours as needed for wheezing or shortness of breath. 06/27/19    Kerin Perna, NP  amLODipine (NORVASC) 5 MG tablet Take 1 tablet (5 mg total) by mouth daily. 06/27/19   Kerin Perna, NP  ammonium lactate (AMLACTIN) 12 % lotion Apply 1 application topically as needed for dry skin. 06/27/19   Kerin Perna, NP  aspirin EC 81 MG tablet Take 1 tablet (81 mg total) by mouth daily. 07/24/18   Fulp, Cammie, MD  atorvastatin (LIPITOR) 20 MG tablet Take 1 tablet (20 mg total) by mouth daily. 06/27/19   Kerin Perna, NP  benzonatate (TESSALON) 100 MG capsule Take 1 capsule (100 mg total) by mouth 2 (two) times daily as needed for cough. 07/26/19   Kerin Perna, NP  budesonide-formoterol (SYMBICORT) 160-4.5 MCG/ACT inhaler Inhale 2 puffs into the lungs 2 (two) times daily. 06/27/19   Kerin Perna, NP  carvedilol (COREG) 6.25 MG tablet Take 1 tablet (6.25 mg total) by mouth 2 (two) times daily with a meal. 06/27/19   Kerin Perna, NP  cetirizine (ZYRTEC) 10 MG tablet Take 1 tablet (10 mg total) by mouth daily. At bedtime as needed 07/24/18   Fulp, Cammie, MD  ferrous sulfate 325 (65 FE) MG tablet Take 1 tablet (325 mg total) by mouth 2 (two) times daily with a meal. To treat anemia 07/01/19   Kerin Perna, NP  glucose blood ([employer] GLUCOSE METER TEST STRIPS) test strip Use as instructed 07/30/19   Kerin Perna, NP  lisinopril (ZESTRIL) 20 MG tablet Take 1 tablet (20 mg total) by mouth daily. 06/27/19   Kerin Perna, NP  metroNIDAZOLE (METROGEL VAGINAL) 0.75 % vaginal gel Place 1 Applicatorful vaginally at bedtime. Insert one applicator, at bedtime, for 5 nights. 07/23/19   Gavin Pound, CNM  pantoprazole (PROTONIX) 20 MG tablet Take 1 tablet (20 mg total) by mouth daily. For stomach protection 07/24/18   Fulp, Cammie, MD  sitaGLIPtin-metformin (JANUMET) 50-500 MG tablet Take 1 tablet by mouth 2 (two) times daily with a meal. 06/27/19   Kerin Perna, NP  triamcinolone cream (KENALOG) 0.1 % Apply 1 application  topically 2 (two) times daily. 07/26/19   Kerin Perna, NP    Allergies    Penicillins, Tape, Other, and Latex  Review of Systems   Review of Systems  Constitutional: Negative for fever.  HENT: Negative for sore throat.   Eyes: Negative for visual disturbance.  Respiratory: Negative for shortness of breath.   Cardiovascular: Negative for chest pain.  Gastrointestinal: Negative for abdominal pain.  Genitourinary: Negative for dysuria.  Musculoskeletal: Negative for back pain.  Skin: Negative for rash.  Neurological: Positive for speech difficulty and weakness. Negative for headaches.    Physical Exam Updated Vital Signs BP (!) 142/77 (  BP Location: Left Arm)   Pulse 99   Temp 99.4 F (37.4 C) (Oral)   Resp 17   Ht 4\' 11"  (1.499 m)   Wt 74.8 kg   SpO2 99%   BMI 33.33 kg/m   Physical Exam Vitals and nursing note reviewed.  Constitutional:      General: She is not in acute distress.    Appearance: She is well-developed.  HENT:     Head: Normocephalic and atraumatic.  Eyes:     Conjunctiva/sclera: Conjunctivae normal.  Cardiovascular:     Rate and Rhythm: Regular rhythm. Tachycardia present.     Heart sounds: Murmur present.  Pulmonary:     Effort: Pulmonary effort is normal. No respiratory distress.     Breath sounds: Normal breath sounds.  Abdominal:     Palpations: Abdomen is soft.     Tenderness: There is no abdominal tenderness.  Musculoskeletal:        General: No deformity or signs of injury. Normal range of motion.     Cervical back: Neck supple.  Skin:    General: Skin is warm and dry.     Capillary Refill: Capillary refill takes less than 2 seconds.  Neurological:     General: No focal deficit present.     Mental Status: She is alert.     Cranial Nerves: No cranial nerve deficit.     Sensory: No sensory deficit.     Motor: No weakness.     Coordination: Coordination normal.     ED Results / Procedures / Treatments   Labs (all labs ordered  are listed, but only abnormal results are displayed) Labs Reviewed  RESPIRATORY PANEL BY RT PCR (FLU A&B, COVID) - Abnormal; Notable for the following components:      Result Value   SARS Coronavirus 2 by RT PCR POSITIVE (*)    All other components within normal limits  CBC - Abnormal; Notable for the following components:   Hemoglobin 10.4 (*)    HCT 35.2 (*)    MCV 76.9 (*)    MCH 22.7 (*)    MCHC 29.5 (*)    RDW 17.9 (*)    All other components within normal limits  COMPREHENSIVE METABOLIC PANEL - Abnormal; Notable for the following components:   CO2 21 (*)    BUN <5 (*)    AST 43 (*)    All other components within normal limits  URINALYSIS, ROUTINE W REFLEX MICROSCOPIC - Abnormal; Notable for the following components:   Color, Urine STRAW (*)    All other components within normal limits  ETHANOL  PROTIME-INR  APTT  DIFFERENTIAL  RAPID URINE DRUG SCREEN, HOSP PERFORMED  HIV ANTIBODY (ROUTINE TESTING W REFLEX)  BASIC METABOLIC PANEL  CBC  CBG MONITORING, ED  I-STAT BETA HCG BLOOD, ED ([facility], [facility], [facility] ONLY)    EKG EKG Interpretation  Date/Time:  Saturday August 03 2019 12:50:41 EST Ventricular Rate:  103 PR Interval:    QRS Duration: 83 QT Interval:  340 QTC Calculation: 445 R Axis:   84 Text Interpretation: Sinus tachycardia Probable lateral infarct, age indeterminate No significant change since 6/20 Confirmed by Aletta Edouard [phone]) on 08/03/2019 1:11:11 PM   Radiology No results found.  Procedures Procedures (including critical care time)  Medications Ordered in ED Medications  enoxaparin (LOVENOX) injection 40 mg (40 mg Subcutaneous Given 08/03/19 1927)  acetaminophen (TYLENOL) tablet 650 mg (has no administration in time range)    Or  acetaminophen (TYLENOL) suppository  650 mg (has no administration in time range)  senna-docusate (Senokot-S) tablet 1 tablet (has no administration in time range)  promethazine (PHENERGAN) tablet 12.5 mg (has no  administration in time range)  albuterol (VENTOLIN HFA) 108 (90 Base) MCG/ACT inhaler 2 puff (has no administration in time range)  aspirin EC tablet 81 mg (81 mg Oral Not Given 08/03/19 1831)  pantoprazole (PROTONIX) EC tablet 20 mg (20 mg Oral Refused 08/03/19 1833)  insulin aspart (novoLOG) injection 0-15 Units (has no administration in time range)  insulin aspart (novoLOG) injection 0-5 Units (has no administration in time range)  LORazepam (ATIVAN) injection 1 mg (1 mg Intravenous Given 08/03/19 2152)    ED Course  I have reviewed the triage vital signs and the nursing notes.  Pertinent labs & imaging results that were available during my care of the patient were reviewed by me and considered in my medical decision making (see chart for details).  Clinical Course as of Aug 02 2214  Sat Aug 03, 2019  1316 Differential diagnosis includes TIA, stroke, bleed, metabolic derangement, infection.  Discussed with Dr. Cheral Marker neurology on-call who calculated her stroke event and said she would need to have a full work-up including admission.  Formal consult to follow.   [MB]  [id number] Discussed with the patient and she is agreeable to admission.  Discussed with internal medicine teaching service who will evaluate the patient for admission.   [MB]    Clinical Course User Index [MB] Hayden Rasmussen, MD   MDM Rules/Calculators/A&P                     Mackenzie Patterson was evaluated in Emergency Department on 08/03/2019 for the symptoms described in the history of present illness. She was evaluated in the context of the global COVID-19 pandemic, which necessitated consideration that the patient might be at risk for infection with the SARS-CoV-2 virus that causes COVID-19. Institutional protocols and algorithms that pertain to the evaluation of patients at risk for COVID-19 are in a state of rapid change based on information released by regulatory bodies including the CDC and federal and state organizations. These  policies and algorithms were followed during the patient's care in the ED.   Final Clinical Impression(s) / ED Diagnoses Final diagnoses:  TIA (transient ischemic attack)    Rx / DC Orders ED Discharge Orders    None       Hayden Rasmussen, MD 08/03/19 2217

## 2019-08-03 NOTE — ED Notes (Signed)
Pt transported to MRI 

## 2019-08-03 NOTE — H&P (Signed)
Date: 08/03/2019               Patient Name:  Mackenzie Patterson MRN: [id number]  DOB: 06/23/1971 Age / Sex: 49 y.o., female   PCP: Kerin Perna, NP         Medical Service: Internal Medicine Teaching Service         Attending Physician: Dr. Sid Falcon, MD    First Contact: Dr. Benjamine Mola Pager: [phone]  Second Contact: Dr. Maricela Bo Pager: [phone]       After Hours (After 5p/  First Contact Pager: [phone]  weekends / holidays): Second Contact Pager: [phone]   Chief Complaint: Temporary left-sided weakness  History of Present Illness:  Patient is a 49 year old female with past medical history significant for hypertension, diabetes, prior TIAs who presented this a.m. after a 15-minute duration of strokelike symptoms.  Patient reports that at 11:23 AM while she was on the toilet she experienced left arm tingling, weakness, left-sided facial droop, and slurred speech.  Patient and boyfriend called EMS and by 11:50 AM symptoms had resolved.  Patient reports that she has had 4 TIAs over the past 3 years with most recent one on June 6.  Patient denies prior history of stroke, states father had a stroke in his 54s as well as coronary artery disease.  Patient denies history of bleeding. Patient denies history of atrial fibrillation or past blood thinner.  Meds:  * Aspirin 81 mg daily * Albuterol inhaler as needed * Symbicort 2 puffs twice daily * Amlodipine 5 mg daily * Sitagliptin-metformin 50-500 1 tablet twice daily * Does not take carvedilol or lisinopril * Does not take iron * Does not take atorvastatin  Allergies: Allergies as of 08/03/2019 - Review Complete 08/03/2019  Allergen Reaction Noted  . Penicillins Anaphylaxis 05/29/2011  . Tape Rash 10/20/2014  . Other Rash 04/08/2014  . Drug ingredient [benzonatate] Other (See Comments) and Cough 08/03/2019  . Latex Rash 03/31/2012   Past Medical History:  Diagnosis Date  . Asthma   . Breast discharge 01/10/2017  .  Congenital ventricular septal defect   . Diabetes mellitus without complication ([place])   . Heart murmur   . Hypertension   . Obesity   . Pulmonary hypertension (HCC)    mild   . Tricuspid regurgitation   . VSD (ventricular septal defect), perimembranous    Qp:Qs 1.7:1 by catheterization     Family History:  * Father with stroke in 36s and CAD  Social History:  * Lives in [place] with boyfriend * Denies alcohol, tobacco, or drug usage * Sees PCP Tammi Klippel at [facility] * Formerly worked Network engineer job, now on disability for irregular heart rhythm (denies A. Fib), never been on a blood thinner  Review of Systems: A complete ROS was negative except as per HPI.  Physical Exam: Blood pressure (!) 141/76, pulse 98, temperature 99.4 F (37.4 C), temperature source Oral, resp. rate (!) 25, height 4\' 11"  (1.499 m), weight 74.8 kg, SpO2 99 %. Physical Exam  Constitutional: She is well-developed, well-nourished, and in no distress.  HENT:  Head: Normocephalic and atraumatic.  Eyes: EOM are normal. Right eye exhibits no discharge. Left eye exhibits no discharge.  Neck: No tracheal deviation present.  Cardiovascular: Normal rate and regular rhythm. Exam reveals no gallop and no friction rub.  Murmur (3/6 murmur heard best at rigth sternal border) heard. Pulmonary/Chest: Effort normal and breath sounds normal. No respiratory distress. She has no wheezes. She has  no rales.  Abdominal: Soft. She exhibits no distension. There is no abdominal tenderness. There is no rebound and no guarding.  Musculoskeletal:        General: No tenderness, deformity or edema. Normal range of motion.     Cervical back: Normal range of motion.  Neurological: She is alert. Coordination normal.  Cranial nerve exam without deficit. 5/5 strength in bilateral upper and lower extremities  Skin: Skin is warm and dry. No rash noted. She is not diaphoretic. No erythema.  Psychiatric: Memory and judgment normal.     EKG: personally reviewed my interpretation is sinus tachycardia  Assessment & Plan by Problem: Active Problems:   TIA (transient ischemic attack)  Patient is a 49 year old female with past medical history significant for hypertension, diabetes, prior TIAs who presented this a.m. after a 15-minute duration of strokelike symptoms.  # Transient ischemic attack:  Patient's history is most consistent with a TIA. Patient is without focal deficit on exam.  Patient was last hospitalized for TIA in November 2019 with non-revealing stroke workup and discharged on aspirin+plavix for 3 weeks. Patient was again seen in ER on June 2020 for TIA symptoms with MR brain negative for acute infarct. Regarding patient's risk factors, patient without history of atrial fibrillation, hemoglobin A1c on 06/27/2019 was 6.2, LDL was 89, total cholesterol 165 on last lipid panel in December 2020. * Neurology consulted  * Will order MR Brain, MRA Brain WO contrast, and MRA Neck with contrast * Telemetry * Continue aspirin 81 mg daily  # GERD: Pantoprazole 20 mg daily  # Asthma: Continue home albuterol as needed  # Hypertension: Hold home amlodipine pending MR brain  # Diabetes Mellitus: SSI  Dispo: Admit patient to Inpatient with expected length of stay greater than 2 midnights.  Signed: Jeanmarie Hubert, MD 08/03/2019, 4:08 PM  Pager: [phone]

## 2019-08-04 ENCOUNTER — Inpatient Hospital Stay (HOSPITAL_COMMUNITY): Payer: Medicaid Other

## 2019-08-04 DIAGNOSIS — I361 Nonrheumatic tricuspid (valve) insufficiency: Secondary | ICD-10-CM

## 2019-08-04 DIAGNOSIS — G459 Transient cerebral ischemic attack, unspecified: Principal | ICD-10-CM

## 2019-08-04 LAB — BASIC METABOLIC PANEL
Anion gap: 12 (ref 5–15)
BUN: 7 mg/dL (ref 6–20)
CO2: 24 mmol/L (ref 22–32)
Calcium: 9.2 mg/dL (ref 8.9–10.3)
Chloride: 103 mmol/L (ref 98–111)
Creatinine, Ser: 0.77 mg/dL (ref 0.44–1.00)
GFR calc Af Amer: >60 mL/min (ref >60)
GFR calc non Af Amer: >60 mL/min (ref >60)
Glucose, Bld: 116 mg/dL — ABNORMAL HIGH (ref 70–99)
Potassium: 4.3 mmol/L (ref 3.5–5.1)
Sodium: 139 mmol/L (ref 135–145)

## 2019-08-04 LAB — HEMOGLOBIN A1C
Hgb A1c MFr Bld: 5.9 % — ABNORMAL HIGH (ref 4.8–5.6)
Mean Plasma Glucose: 122.63 mg/dL

## 2019-08-04 LAB — LIPID PANEL
Cholesterol: 160 mg/dL (ref 0–200)
HDL: 43 mg/dL (ref >40)
LDL Cholesterol: 84 mg/dL (ref 0–99)
Total CHOL/HDL Ratio: 3.7 RATIO
Triglycerides: 163 mg/dL — ABNORMAL HIGH (ref <150)
VLDL: 33 mg/dL (ref 0–40)

## 2019-08-04 LAB — CBC
HCT: 35 % — ABNORMAL LOW (ref 36.0–46.0)
Hemoglobin: 10.2 g/dL — ABNORMAL LOW (ref 12.0–15.0)
MCH: 22.8 pg — ABNORMAL LOW (ref 26.0–34.0)
MCHC: 29.1 g/dL — ABNORMAL LOW (ref 30.0–36.0)
MCV: 78.1 fL — ABNORMAL LOW (ref 80.0–100.0)
Platelets: 322 10*3/uL (ref 150–400)
RBC: 4.48 MIL/uL (ref 3.87–5.11)
RDW: 17.9 % — ABNORMAL HIGH (ref 11.5–15.5)
WBC: 5.1 10*3/uL (ref 4.0–10.5)
nRBC: 0 % (ref 0.0–0.2)

## 2019-08-04 LAB — GLUCOSE, CAPILLARY
Glucose-Capillary: 108 mg/dL — ABNORMAL HIGH (ref 70–99)
Glucose-Capillary: 125 mg/dL — ABNORMAL HIGH (ref 70–99)
Glucose-Capillary: 132 mg/dL — ABNORMAL HIGH (ref 70–99)
Glucose-Capillary: 91 mg/dL (ref 70–99)

## 2019-08-04 LAB — HIV ANTIBODY (ROUTINE TESTING W REFLEX): HIV Screen 4th Generation wRfx: NONREACTIVE

## 2019-08-04 MED ORDER — CLOPIDOGREL BISULFATE 75 MG PO TABS: 75.0000 mg | ORAL_TABLET | Freq: Every day | ORAL | 0 refills | Status: DC

## 2019-08-04 MED ORDER — ATORVASTATIN CALCIUM 40 MG PO TABS: 40.0000 mg | ORAL_TABLET | Freq: Every day | ORAL | 0 refills | Status: DC

## 2019-08-04 MED ORDER — CELECOXIB 200 MG PO CAPS: 400.0000 mg | ORAL_CAPSULE | Freq: Once | ORAL | Status: DC

## 2019-08-04 NOTE — Progress Notes (Signed)
Pt arrived from the ED to rm 5w31. Call bell in reach. Oriented to room and bed locked. Bedside tele completed. VSS. NIH completed.

## 2019-08-04 NOTE — Progress Notes (Signed)
OT Cancellation Note  Patient Details Name: Glayds Rohweder MRN: [id number] DOB: 15-Apr-1971   Cancelled Treatment:    Reason Eval/Treat Not Completed: OT screened, no needs identified, will sign off.  Spoke with PT who reports pt is back to baseline, and MRI negative.  Nilsa Nutting., OTR/L Acute Rehabilitation Services Pager [phone] Office [phone]   Lucille Passy M 08/04/2019, 1:29 PM

## 2019-08-04 NOTE — Progress Notes (Signed)
  Echocardiogram 2D Echocardiogram has been performed.  Mackenzie Patterson 08/04/2019, 3:47 PM

## 2019-08-04 NOTE — Procedures (Signed)
ELECTROENCEPHALOGRAM REPORT   Patient: Mackenzie Patterson       Room #: [id number] EEG No. ID: [id number] Age: 49 y.o.        Sex: female Requesting Physician: Daryll Drown Report Date:  08/04/2019        Interpreting Physician: Alexis Goodell  History: Mackenzie Patterson is an 49 y.o. female with an episode of left sided numbness and weakness  Medications:  ASA, Plavix, Insulin, Protonix  Conditions of Recording:  This is a 21 channel routine scalp EEG performed with bipolar and monopolar montages arranged in accordance to the international 10/20 system of electrode placement. One channel was dedicated to EKG recording.  The patient is in the awake, drowsy and asleep state.  Description:  The waking background activity consists of a low voltage, symmetrical, fairly well organized, 9-10 Hz alpha activity, seen from the parieto-occipital and posterior temporal regions and is often obscured by superimposed beta activity.  Low voltage fast activity, poorly organized, is seen anteriorly and is at times superimposed on more posterior regions.  A mixture of theta and alpha rhythms are seen from the central and temporal regions. The patient drowses with slowing to irregular, low voltage theta and beta activity.   The patient goes in to a light sleep with symmetrical sleep spindles, vertex central sharp transients and irregular slow activity.  No epileptiform activity is noted.   Hyperventilation was not performed.  Intermittent photic stimulation was performed and elicits a symmetrical driving response but fails to elicit any abnormalities.  IMPRESSION: Normal electroencephalogram, awake, asleep and with activation procedures. There are no focal lateralizing or epileptiform features.   Alexis Goodell, MD Neurology [phone] 08/04/2019, 3:59 PM

## 2019-08-04 NOTE — Progress Notes (Signed)
  Subjective:  Patient seen and examined at bedside.  Patient states she feels good and has no recurrent weakness, numbness, changes in speech.  She states she feels well and is comfortable with going home today.  Objective:   Vital Signs (last 24 hours): Vitals:   08/04/19 0100 08/04/19 0124 08/04/19 0544 08/04/19 1000  BP: 105/62 105/62 115/69   Pulse:  89 90   Resp: 20 18 (!) 23 18  Temp:  97.6 F (36.4 C) 98.6 F (37 C)   TempSrc:  Oral Oral   SpO2:  98% 99%   Weight:  72 kg    Height:  4\' 11"  (1.499 m)      Physical Exam: General Alert and answers questions appropriately, no acute distress  Cardiac III/VI holosystolic murmur heard best at left sternal border  Pulmonary Clear to auscultation bilaterally without wheezes, rhonchi, or rales  Abdominal No cranial nerve deficit, strength 5/5 in upper and lower bilateral extremities   CBC Latest Ref Rng & Units 08/04/2019 08/03/2019 06/27/2019  WBC 4.0 - 10.5 K/uL 5.1 5.2 6.1  Hemoglobin 12.0 - 15.0 g/dL 10.2(L) 10.4(L) 10.8(L)  Hematocrit 36.0 - 46.0 % 35.0(L) 35.2(L) 35.2  Platelets 150 - 400 K/uL 322 349 260   BMP Latest Ref Rng & Units 08/04/2019 08/03/2019 06/27/2019  Glucose 70 - 99 mg/dL 116(H) 96 75  BUN 6 - 20 mg/dL 7 <5(L) 10  Creatinine 0.44 - 1.00 mg/dL 0.77 0.82 0.83  BUN/Creat Ratio 9 - 23 - - 12  Sodium 135 - 145 mmol/L 139 138 137  Potassium 3.5 - 5.1 mmol/L 4.3 4.1 4.7  Chloride 98 - 111 mmol/L 103 105 102  CO2 22 - 32 mmol/L 24 21(L) 22  Calcium 8.9 - 10.3 mg/dL 9.2 9.1 9.6   Lipid Panel     Component Value Date/Time   CHOL 160 08/04/2019 0656   CHOL 165 06/27/2019 0911   TRIG 163 (H) 08/04/2019 0656   HDL 43 08/04/2019 0656   HDL 48 06/27/2019 0911   CHOLHDL 3.7 08/04/2019 0656   VLDL 33 08/04/2019 0656   LDLCALC 84 08/04/2019 0656   LDLCALC 89 06/27/2019 0911   LABVLDL 28 06/27/2019 0911   HgbA1C: 5.9   Assessment/Plan:   Active Problems:   TIA (transient ischemic attack)  Patient is a  49 year old female with past medical history significant for hypertension, diabetes, prior TIA-like episodes who presented this a.m. after a 15-minute duration of strokelike symptoms.  # Transient ischemic attack: Patient's history sounds consistent with a transient ischemic attack but could represent migraine or although headache is absent.  There are no signs concerning for seizure activity.  Per neurology's recommendation, we are working up as transient ischemic attack. 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

## 2019-08-04 NOTE — Progress Notes (Signed)
STROKE TEAM PROGRESS NOTE   HISTORY OF PRESENT ILLNESS (per record) Mackenzie Patterson is a 49 y.o. female with a history of pulmonary hypertension, hypertension, diabetes, heart murmur who presents with left-sided numbness and weakness that lasted for approximately 15 minutes.  She stated that initially she noticed some tingling in her left hand which then progressed to left-sided numbness including her arm and leg.  This then gradually improved over the course of 15 minutes. She has had 4 of these episodes before, all relatively stereotyped with tingling in her left arm followed by weakness of the left side.  She denies any headache or any history of migraines.  She denies any visual change.   INTERVAL HISTORY I personally reviewed history of presenting illness in detail with the patient, electronic medical records and imaging films in PACS.  She presented with a 15-minute episode of left arm and leg numbness, tingling and some weakness which resolved by the time she reached the hospital.  Review of her chart shows that she had somewhat similar episode in April 2019 as well as November 2019 with negative MRI scan and episode was felt to be UTI.  She states she was taking aspirin regularly and has not missed any dose.  She does not have any active symptoms of Covid infection but admits she was exposed to a cousin who had the disease.    OBJECTIVE Vitals:   08/04/19 0030 08/04/19 0100 08/04/19 0124 08/04/19 0544  BP:  105/62 105/62 115/69  Pulse:   89 90  Resp:  20 18 (!) 23  Temp:   97.6 F (36.4 C) 98.6 F (37 C)  TempSrc:   Oral Oral  SpO2:   98% 99%  Weight: 72 kg  72 kg   Height: 4\' 11"  (1.499 m)  4\' 11"  (1.499 m)     CBC:  Recent Labs  Lab 08/03/19 1320 08/04/19 0435  WBC 5.2 5.1  NEUTROABS 3.0  --   HGB 10.4* 10.2*  HCT 35.2* 35.0*  MCV 76.9* 78.1*  PLT 349 [id number]    Basic Metabolic Panel:  Recent Labs  Lab 08/03/19 1320 08/04/19 0435  NA 138 139  K 4.1 4.3  CL 105 103   CO2 21* 24  GLUCOSE 96 116*  BUN <5* 7  CREATININE 0.82 0.77  CALCIUM 9.1 9.2    Lipid Panel:     Component Value Date/Time   CHOL 160 08/04/2019 0656   CHOL 165 06/27/2019 0911   TRIG 163 (H) 08/04/2019 0656   HDL 43 08/04/2019 0656   HDL 48 06/27/2019 0911   CHOLHDL 3.7 08/04/2019 0656   VLDL 33 08/04/2019 0656   LDLCALC 84 08/04/2019 0656   LDLCALC 89 06/27/2019 0911   HgbA1c:  Lab Results  Component Value Date   HGBA1C 5.9 (H) 08/04/2019   Urine Drug Screen:     Component Value Date/Time   LABOPIA NONE DETECTED 08/03/2019 1339   COCAINSCRNUR NONE DETECTED 08/03/2019 1339   LABBENZ NONE DETECTED 08/03/2019 1339   AMPHETMU NONE DETECTED 08/03/2019 1339   THCU NONE DETECTED 08/03/2019 1339   LABBARB NONE DETECTED 08/03/2019 1339    Alcohol Level     Component Value Date/Time   ETH <10 08/03/2019 1320    IMAGING  MR BRAIN WO CONTRAST MR ANGIO HEAD WO CONTRAST MR ANGIO NECK W WO CONTRAST 08/04/2019 IMPRESSION:  Stable appearance of the brain. 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 Otherwise normal study. MR angiography of the neck vessels is normal.  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 Both anterior cerebral is a receive there supply from the right carotid circulation.    Transthoracic Echocardiogram  00/00/2021 Pending  ECG - ST rate 103 BPM. (See cardiology reading for complete details)  EEG - pending 08/04/19  PHYSICAL EXAM Blood pressure 115/69, pulse 90, temperature 98.6 F (37 C), temperature source Oral, resp. rate (!) 23, height 4\' 11"  (1.499 m), weight 72 kg, SpO2 99 %. Pleasant middle-aged African-American lady not in distress. . Afebrile. Head is nontraumatic. Neck is supple without bruit.    Cardiac exam no murmur or gallop. Lungs are clear to auscultation. Distal pulses are well felt.  Neurological Exam ;  Awake  Alert oriented x  3. Normal speech and language.eye movements full without nystagmus.fundi were not visualized. Vision acuity and fields appear normal. Hearing is normal. Palatal movements are normal. Face symmetric. Tongue midline. Normal strength, tone, reflexes and coordination. Normal sensation. Gait deferred.   ASSESSMENT/PLAN Mackenzie Patterson is a 49 y.o. female with history of pulmonary hypertension, hypertension, diabetes, and a heart murmur who presents with left-sided numbness and weakness that lasted for approximately 15 minutes. She's had 4 previous similar episodes.  She did not receive IV t-PA due to resolution of deficits.  Probable TIAs:  Small vessel disease  Resultant  No deficit  Code Stroke CT Head - not ordered  CT head - not ordered  MRI head - Stable appearance of the brain. 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  2D Echo - pending  Lacey Jensen Virus 2 - positive  LDL - 84  HgbA1c - 5.9  UDS - negative  VTE prophylaxis - Lovenox Diet  Diet Order            Diet Carb Modified Fluid consistency: Thin; Room service appropriate? Yes  Diet effective now              aspirin 81 mg daily prior to admission, now on aspirin 81 mg daily and clopidogrel 75 mg daily  Patient counseled to be compliant with her antithrombotic medications  Ongoing aggressive stroke risk factor management  Therapy recommendations:  pending  Disposition:  Pending  Hypertension  Home BP meds: Norvasc, Coreg, Lisinopril  Current BP meds:   Blood pressure somewhat low. . Permissive hypertension (OK if < 220/120) but gradually normalize in 5-7 days  . Long-term BP goal normotensive  Hyperlipidemia  Home Lipid lowering medication: Lipitor 20 mg daily  LDL 84, goal < 70  Current lipid lowering medication: none - would consider  increasing Lipitor to 40 or 80 mg daily.  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  She presented with transient episode of left arm and leg numbness and weakness likely due to right hemispheric subcortical TIA.  Brain imaging and vascular studies are unremarkable.  Her Covid infection is likely incidental and she is asymptomatic from it.  Recommend aspirin Plavix for 3 weeks followed by Plavix alone.  Aggressive risk factor modification.  Statin for elevated LDL.  Follow-up as an outpatient stroke clinic.  Discussed with Dr. Aundra Dubin from internal medicine teaching service.  Kindly call for questions greater than 50% time during this 35-minute visit was spent in counseling and coordination of care about TIA and stroke and answering questions Antony Contras, MD Medical Director [place] Pager: [phone] 08/04/2019 12:41 PM   To contact Stroke Continuity provider, please refer to [email or web address] After hours, contact General Neurology

## 2019-08-04 NOTE — Progress Notes (Signed)
Patient was discharged home by MD order; discharged instructions review and given to patient with care notes ; IV DIC; skin intact; patient will be escorted to the car by nurse tech via wheelchair.  

## 2019-08-04 NOTE — Progress Notes (Signed)
SLP Cancellation Note  Patient Details Name: Mackenzie Patterson MRN: [id number] DOB: Sep 30, 1970   Cancelled treatment:       Reason Eval/Treat Not Completed: SLP screened, no needs identified, will sign off.    Florestine Carmical, Katherene Ponto 08/04/2019, 12:28 PM

## 2019-08-04 NOTE — Evaluation (Signed)
Physical Therapy Evaluation and Discharge Patient Details Name: Mackenzie Patterson MRN: [id number] DOB: 09-Nov-1970 Today's Date: 08/04/2019   History of Present Illness  49 y.o. female with a history of pulmonary hypertension, hypertension, diabetes, heart murmur who presented 08/03/19 with left-sided numbness and weakness that lasted for approximately 15 minutes.  She stated that initially she noticed some tingling in her left hand which then progressed to left-sided numbness including her arm and leg. She has had 4 of these episodes before, all relatively stereotyped with tingling in her left arm followed by weakness of the left side. She denies any headache orany history of migraines.She denies any visual change. MRI brain showed old left cerebellar infarction. Angiography of neck and intracranial vessels normal.   Clinical Impression   Patient evaluated by Physical Therapy with no further acute PT needs identified. All education has been completed and the patient has no further questions. (Educated on incr fall risk in low-lighting as she struggled to keep her balance with eyes closed--from prior cerebellar infarct). Patient is at her baseline and independent with mobility. PT is signing off. Thank you for this referral.     Follow Up Recommendations No PT follow up    Equipment Recommendations  None recommended by PT    Recommendations for Other Services       Precautions / Restrictions Precautions Precautions: None      Mobility  Bed Mobility Overal bed mobility: Independent                Transfers Overall transfer level: Independent Equipment used: None                Ambulation/Gait Ambulation/Gait assistance: Independent Gait Distance (Feet): 200 Feet Assistive device: None Gait Pattern/deviations: WFL(Within Functional Limits) Gait velocity: normal   General Gait Details: included portions of DGI without difficulty  Stairs            Wheelchair  Mobility    Modified Rankin (Stroke Patients Only) Modified Rankin (Stroke Patients Only) Pre-Morbid Rankin Score: No symptoms Modified Rankin: No symptoms     Balance Overall balance assessment: Independent                       Rhomberg - Eyes Opened: 30 Rhomberg - Eyes Closed: 10(incr sway, but catches herself; has old cerebellar infarct) High level balance activites: Side stepping;Backward walking;Direction changes;Turns;Sudden stops;Head turns High Level Balance Comments: no issues; also alternating taps to 8" step x 10 reps with no imbalance             Pertinent Vitals/Pain Pain Assessment: No/denies pain    Home Living Family/patient expects to be discharged to:: Private residence Living Arrangements: Spouse/significant other Available Help at Discharge: Family;Available 24 hours/day Type of Home: House Home Access: Stairs to enter Entrance Stairs-Rails: Right;Left;Can reach both Entrance Stairs-Number of Steps: 3 Home Layout: One level Home Equipment: Walker - 2 wheels      Prior Function Level of Independence: Independent         Comments: used RW after one of these episodes that impaired her balance (PT prescribed RW while in hospital); she used it about 1 week after discharge     Hand Dominance   Dominant Hand: Right    Extremity/Trunk Assessment   Upper Extremity Assessment Upper Extremity Assessment: Overall WFL for tasks assessed    Lower Extremity Assessment Lower Extremity Assessment: Overall WFL for tasks assessed    Cervical / Trunk Assessment Cervical / Trunk Assessment:  Normal  Communication   Communication: No difficulties  Cognition Arousal/Alertness: Awake/alert Behavior During Therapy: WFL for tasks assessed/performed Overall Cognitive Status: Within Functional Limits for tasks assessed                                        General Comments General comments (skin integrity, edema, etc.): HR,  sats WNL throughout on RA    Exercises     Assessment/Plan    PT Assessment Patent does not need any further PT services  PT Problem List         PT Treatment Interventions      PT Goals (Current goals can be found in the Care Plan section)  Acute Rehab PT Goals PT Goal Formulation: All assessment and education complete, DC therapy    Frequency     Barriers to discharge        Co-evaluation               AM-PAC PT "6 Clicks" Mobility  Outcome Measure Help needed turning from your back to your side while in a flat bed without using bedrails?: None Help needed moving from lying on your back to sitting on the side of a flat bed without using bedrails?: None Help needed moving to and from a bed to a chair (including a wheelchair)?: None Help needed standing up from a chair using your arms (e.g., wheelchair or bedside chair)?: None Help needed to walk in hospital room?: None Help needed climbing 3-5 steps with a railing? : None 6 Click Score: 24    End of Session   Activity Tolerance: Patient tolerated treatment well Patient left: in bed;with call bell/phone within reach   PT Visit Diagnosis: Other symptoms and signs involving the nervous system [id number])    Time: [id number] PT Time Calculation (min) (ACUTE ONLY): 15 min   Charges:   PT Evaluation $PT Eval Low Complexity: 1 Low           Arby Barrette, PT Pager [phone]   Rexanne Mano 08/04/2019, 1:35 PM

## 2019-08-04 NOTE — Progress Notes (Signed)
EEG complete - results pending 

## 2019-08-05 ENCOUNTER — Telehealth: Payer: Self-pay

## 2019-08-05 NOTE — Discharge Summary (Addendum)
Name: Mackenzie Patterson MRN: [id number] DOB: 1971-06-22 49 y.o. PCP: Kerin Perna, NP  Date of Admission: 08/03/2019 12:36 PM Date of Discharge: 08/04/2019 Attending Physician: Dr. Daryll Drown  Discharge Diagnosis: 1. Transient ischemic attack  Discharge Medications: Allergies as of 08/04/2019      Reactions   Penicillins Anaphylaxis   Has patient had a PCN reaction causing immediate rash, facial/tongue/throat swelling, SOB or lightheadedness with hypotension: Yes Has patient had a PCN reaction causing severe rash involving mucus membranes or skin necrosis: Yes Has patient had a PCN reaction that required hospitalization Yes- went to ED, was not admitted Has patient had a PCN reaction occurring within the last 10 years: No- longer then 10 years If all of the above answers are "NO", then may proceed with Cephalosporin use.   Tape Rash   Other reaction(s): Other (See Comments) Burns skin   Other Rash   Other reaction(s): Other (See Comments) **EKG GEL PADS**  "Burns my skin" Pt states she is allergic to a blood pressure medication, unsure.  **EKG GEL PADS**  "Burns my skin"   Drug Ingredient [benzonatate] Other (See Comments), Cough   Makes cough worse   Latex Rash      Medication List    STOP taking these medications   ammonium lactate 12 % lotion Commonly known as: AmLactin   benzonatate 100 MG capsule Commonly known as: TESSALON   carvedilol 6.25 MG tablet Commonly known as: COREG   ferrous sulfate 325 (65 FE) MG tablet   lisinopril 20 MG tablet Commonly known as: ZESTRIL   metroNIDAZOLE 0.75 % vaginal gel Commonly known as: METROGEL VAGINAL   pantoprazole 20 MG tablet Commonly known as: Protonix     TAKE these medications   Accu-Chek FastClix Lancets Misc USE 2 TIMES DAILY AS DIRECTED   albuterol 108 (90 Base) MCG/ACT inhaler Commonly known as: VENTOLIN HFA Inhale 2 puffs into the lungs every 6 (six) hours as needed for wheezing or shortness of breath.    amLODipine 5 MG tablet Commonly known as: NORVASC Take 1 tablet (5 mg total) by mouth daily.   aspirin EC 81 MG tablet Take 1 tablet (81 mg total) by mouth daily.   atorvastatin 40 MG tablet Commonly known as: LIPITOR Take 1 tablet (40 mg total) by mouth daily. What changed:   medication strength  how much to take   budesonide-formoterol 160-4.5 MCG/ACT inhaler Commonly known as: SYMBICORT Inhale 2 puffs into the lungs 2 (two) times daily.   cetirizine 10 MG tablet Commonly known as: ZYRTEC Take 1 tablet (10 mg total) by mouth daily. At bedtime as needed   clopidogrel 75 MG tablet Commonly known as: PLAVIX Take 1 tablet (75 mg total) by mouth daily.   [employer] Glucose Meter Test Strips test strip Generic drug: glucose blood Use as instructed   Janumet 50-500 MG tablet Generic drug: sitaGLIPtin-metformin Take 1 tablet by mouth 2 (two) times daily with a meal.   triamcinolone cream 0.1 % Commonly known as: KENALOG Apply 1 application topically 2 (two) times daily.       Disposition and follow-up:   Mackenzie Patterson was discharged from [facility] in Good condition.  At the hospital follow up visit please address:  1.  Patient should remain on aspirin+plavix for 4 weeks and then plavix alone afterwards as she has failed aspirin monotherapy. Please assess for any recurrent stroke-like symptoms  2.  Labs / imaging needed at time of follow-up: None  3.  Pending  labs/ test needing follow-up: None  Follow-up Appointments: Follow-up Information    Kerin Perna, NP Follow up.   Specialty: Internal Medicine Why: Please call Sharyn Lull for an appointment within the next week Contact information: [address] [place] [address] [place] Hospital Course by problem list: # Transient ischemic attack: Patient is a 49 year old female with PMH significant for hypertension, diabetes, prior TIA-like episodes who presented on 08/03/19  after 15-minute duration of left-sided weakness, numbness, facial droop and slurred speech. Symptoms had fully resolved by the time she arrived to ER. MR brain was negative for acute stroke, MRA brain was without LVO, MR angio neck demonstrated carotid arteries without stenosis or irregularity. Hemoglobin A1C was 5.9, lipid panel revealed LDL above goal at 84. TTE demonstrated known restrictive perimembranous VSD and EEG was WNL.  Patient had previously been taking aspirin and was discharged on aspirin+plavix for 3 weeks followed by plavix alone (as she had failed aspirin therapy). Patient also instructed to increase atorvastatin dosage from 20 mg daily (moderate intensity) to 40 mg daily (high intensity).  Discharge Vitals:   BP 115/69 (BP Location: Right Arm)   Pulse 90   Temp 98.6 F (37 C) (Oral)   Resp 18   Ht 4\' 11"  (1.499 m)   Wt 72 kg   SpO2 99%   BMI 32.06 kg/m   Pertinent Labs, Studies, and Procedures:  CBC Latest Ref Rng & Units 08/04/2019 08/03/2019 06/27/2019  WBC 4.0 - 10.5 K/uL 5.1 5.2 6.1  Hemoglobin 12.0 - 15.0 g/dL 10.2(L) 10.4(L) 10.8(L)  Hematocrit 36.0 - 46.0 % 35.0(L) 35.2(L) 35.2  Platelets 150 - 400 K/uL 322 349 260   BMP Latest Ref Rng & Units 08/04/2019 08/03/2019 06/27/2019  Glucose 70 - 99 mg/dL 116(H) 96 75  BUN 6 - 20 mg/dL 7 <5(L) 10  Creatinine 0.44 - 1.00 mg/dL 0.77 0.82 0.83  BUN/Creat Ratio 9 - 23 - - 12  Sodium 135 - 145 mmol/L 139 138 137  Potassium 3.5 - 5.1 mmol/L 4.3 4.1 4.7  Chloride 98 - 111 mmol/L 103 105 102  CO2 22 - 32 mmol/L 24 21(L) 22  Calcium 8.9 - 10.3 mg/dL 9.2 9.1 9.6   Lipid Panel     Component Value Date/Time   CHOL 160 08/04/2019 0656   CHOL 165 06/27/2019 0911   TRIG 163 (H) 08/04/2019 0656   HDL 43 08/04/2019 0656   HDL 48 06/27/2019 0911   CHOLHDL 3.7 08/04/2019 0656   VLDL 33 08/04/2019 0656   LDLCALC 84 08/04/2019 0656   LDLCALC 89 06/27/2019 0911   LABVLDL 28 06/27/2019 0911   Hemoglobin A1C: 5.9  EEG (08/04/19):  IMPRESSION: Normal electroencephalogram, awake, asleep and with activation procedures. There are no focal lateralizing or epileptiform features.  TTE (08/04/19): IMPRESSIONS  1. Left ventricular ejection fraction, by visual estimation, is 60 to  65%. The left ventricle has normal function. There is no increased left  ventricular wall thickness.  2. Left ventricular diastolic parameters are indeterminate.  3. The left ventricle has no regional wall motion abnormalities.  4. Global right ventricle has normal systolc function.The right  ventricular size is normal. no increase in right ventricular wall  thickness.  5. Left atrial size was severely dilated.  6. Right atrial size was moderately dilated.  7. Trivial pericardial effusion is present.  8. The mitral valve is grossly normal.  9. The tricuspid valve was grossly normal.  Tricuspid valve regurgitation  moderate.  10. Tricuspid valve regurgitation moderate.  11. No evidence of aortic valve sclerosis or stenosis.  12. Pulmonary hypertension is severe.  13. Severely elevated pulmonary artery systolic pressure.  10. Restrictive perimembranous VSD noted.   MR Brain WO Contrast, MRA Head WO Contrast, MR Angio Neck W WO Contrast (08/03/19): IMPRESSION: Stable appearance of the brain. 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 Otherwise normal study.  MR angiography of the neck vessels is normal.  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 Both anterior cerebral is a receive there supply from the right carotid circulation.  Discharge Instructions: Discharge Instructions    Call MD for:   Complete by: As directed    New weakness, numbness, changes in vision or any other concerning symptom   Call MD for:  extreme fatigue   Complete by: As directed    Call MD for:  extreme fatigue   Complete by: As directed    Call MD  for:  persistant dizziness or light-headedness   Complete by: As directed    Call MD for:  persistant dizziness or light-headedness   Complete by: As directed    Discharge instructions   Complete by: As directed    You were seen in the hospital for a TIA. You have made a great recovery and no longer have any residual symptoms. We want you to keep taking aspirin and start taking plavix. We have sent a prescription for plavix (clopidegrel) to your pharmacy. After 4 weeks, stop taking aspirin but keep taking plavix. We have also increased your atorvastatin dosage to better control your cholesterol. Please see your primary care doctor within the next week and discuss your hospitalization. Your primary doctor will need to provide you with further prescriptions for plavix.   Regarding your COVID-19 infection, the CDC recommends quarantining for 10 days for an asymptomatic infection such as yours. Starting tomorrow, there is no longer a need for you to quarantine. Please continue to take standard precautions (such as social distancing, wearing a mask) as you would before you got the virus.  Thank you for allowing [name] to be part of your medical care!   Increase activity slowly   Complete by: As directed    Increase activity slowly   Complete by: As directed       Signed: Jeanmarie Hubert, MD 08/06/2019, 7:13 AM

## 2019-08-05 NOTE — Telephone Encounter (Signed)
Transition Care Management Follow-up Telephone Call Date of discharge and from where: 08/02/2019, [facility]   Call placed to patient # [phone], message left with call back requested to this CM.  She needs to schedule a hospital follow up appointment at [facility]

## 2019-08-06 ENCOUNTER — Telehealth: Payer: Self-pay

## 2019-08-06 NOTE — Telephone Encounter (Signed)
Transition Care Management Follow-up Telephone Call - attempt # 2  Date of discharge and from where: 08/02/2019, [facility]   Call placed to patient # [phone], message left with call back requested to this CM.  She needs to schedule a hospital follow up appointment at [facility]

## 2019-08-07 ENCOUNTER — Telehealth: Payer: Self-pay

## 2019-08-07 NOTE — Telephone Encounter (Signed)
Unable to reach patient to schedule hospital follow up appointment.  Letter sent to her

## 2019-08-30 ENCOUNTER — Other Ambulatory Visit: Payer: Self-pay | Admitting: Internal Medicine

## 2019-08-30 DIAGNOSIS — E781 Pure hyperglyceridemia: Secondary | ICD-10-CM

## 2019-09-06 DIAGNOSIS — Z8673 Personal history of transient ischemic attack (TIA), and cerebral infarction without residual deficits: Secondary | ICD-10-CM | POA: Diagnosis not present

## 2019-09-06 DIAGNOSIS — G459 Transient cerebral ischemic attack, unspecified: Secondary | ICD-10-CM | POA: Diagnosis not present

## 2019-09-06 DIAGNOSIS — I10 Essential (primary) hypertension: Secondary | ICD-10-CM | POA: Diagnosis not present

## 2019-09-06 DIAGNOSIS — G43009 Migraine without aura, not intractable, without status migrainosus: Secondary | ICD-10-CM | POA: Diagnosis not present

## 2019-09-24 ENCOUNTER — Emergency Department (HOSPITAL_COMMUNITY): Payer: Medicaid Other

## 2019-09-24 ENCOUNTER — Emergency Department (HOSPITAL_COMMUNITY)
Admission: EM | Admit: 2019-09-24 | Discharge: 2019-09-24 | Disposition: A | Discharge status: Home / Self Care | Payer: Medicaid Other | Attending: Emergency Medicine | Admitting: Emergency Medicine

## 2019-09-24 DIAGNOSIS — R4701 Aphasia: Secondary | ICD-10-CM | POA: Diagnosis not present

## 2019-09-24 DIAGNOSIS — R000 Tachycardia, unspecified: Secondary | ICD-10-CM | POA: Diagnosis not present

## 2019-09-24 DIAGNOSIS — Z7901 Long term (current) use of anticoagulants: Secondary | ICD-10-CM | POA: Insufficient documentation

## 2019-09-24 DIAGNOSIS — R208 Other disturbances of skin sensation: Secondary | ICD-10-CM | POA: Diagnosis not present

## 2019-09-24 DIAGNOSIS — I10 Essential (primary) hypertension: Secondary | ICD-10-CM | POA: Insufficient documentation

## 2019-09-24 DIAGNOSIS — E119 Type 2 diabetes mellitus without complications: Secondary | ICD-10-CM | POA: Diagnosis not present

## 2019-09-24 DIAGNOSIS — Z79899 Other long term (current) drug therapy: Secondary | ICD-10-CM | POA: Diagnosis not present

## 2019-09-24 DIAGNOSIS — G459 Transient cerebral ischemic attack, unspecified: Secondary | ICD-10-CM | POA: Diagnosis not present

## 2019-09-24 DIAGNOSIS — R4789 Other speech disturbances: Secondary | ICD-10-CM | POA: Diagnosis not present

## 2019-09-24 DIAGNOSIS — R29898 Other symptoms and signs involving the musculoskeletal system: Secondary | ICD-10-CM | POA: Diagnosis not present

## 2019-09-24 DIAGNOSIS — R531 Weakness: Secondary | ICD-10-CM | POA: Diagnosis not present

## 2019-09-24 DIAGNOSIS — R200 Anesthesia of skin: Secondary | ICD-10-CM

## 2019-09-24 LAB — RAPID URINE DRUG SCREEN, HOSP PERFORMED
Amphetamines: NOT DETECTED
Barbiturates: NOT DETECTED
Benzodiazepines: NOT DETECTED
Cocaine: NOT DETECTED
Opiates: NOT DETECTED
Tetrahydrocannabinol: NOT DETECTED

## 2019-09-24 LAB — CBC
HCT: 31.9 % — ABNORMAL LOW (ref 36.0–46.0)
Hemoglobin: 9.3 g/dL — ABNORMAL LOW (ref 12.0–15.0)
MCH: 22 pg — ABNORMAL LOW (ref 26.0–34.0)
MCHC: 29.2 g/dL — ABNORMAL LOW (ref 30.0–36.0)
MCV: 75.4 fL — ABNORMAL LOW (ref 80.0–100.0)
Platelets: 280 10*3/uL (ref 150–400)
RBC: 4.23 MIL/uL (ref 3.87–5.11)
RDW: 16.5 % — ABNORMAL HIGH (ref 11.5–15.5)
WBC: 6.1 10*3/uL (ref 4.0–10.5)
nRBC: 0 % (ref 0.0–0.2)

## 2019-09-24 LAB — COMPREHENSIVE METABOLIC PANEL
ALT: 25 U/L (ref 0–44)
AST: 30 U/L (ref 15–41)
Albumin: 3.9 g/dL (ref 3.5–5.0)
Alkaline Phosphatase: 49 U/L (ref 38–126)
Anion gap: 11 (ref 5–15)
BUN: 11 mg/dL (ref 6–20)
CO2: 22 mmol/L (ref 22–32)
Calcium: 8.9 mg/dL (ref 8.9–10.3)
Chloride: 102 mmol/L (ref 98–111)
Creatinine, Ser: 0.91 mg/dL (ref 0.44–1.00)
GFR calc Af Amer: >60 mL/min (ref >60)
GFR calc non Af Amer: >60 mL/min (ref >60)
Glucose, Bld: 147 mg/dL — ABNORMAL HIGH (ref 70–99)
Potassium: 3.6 mmol/L (ref 3.5–5.1)
Sodium: 135 mmol/L (ref 135–145)
Total Bilirubin: 1.1 mg/dL (ref 0.3–1.2)
Total Protein: 6.9 g/dL (ref 6.5–8.1)

## 2019-09-24 LAB — PROTIME-INR
INR: 1.1 (ref 0.8–1.2)
Prothrombin Time: 14.5 seconds (ref 11.4–15.2)

## 2019-09-24 LAB — URINALYSIS, ROUTINE W REFLEX MICROSCOPIC
Bilirubin Urine: NEGATIVE
Glucose, UA: NEGATIVE mg/dL
Hgb urine dipstick: NEGATIVE
Ketones, ur: NEGATIVE mg/dL
Leukocytes,Ua: NEGATIVE
Nitrite: NEGATIVE
Protein, ur: NEGATIVE mg/dL
Specific Gravity, Urine: 1.009 (ref 1.005–1.030)
pH: 5 (ref 5.0–8.0)

## 2019-09-24 LAB — DIFFERENTIAL
Abs Immature Granulocytes: 0.03 10*3/uL (ref 0.00–0.07)
Basophils Absolute: 0 10*3/uL (ref 0.0–0.1)
Basophils Relative: 0 %
Eosinophils Absolute: 0.1 10*3/uL (ref 0.0–0.5)
Eosinophils Relative: 1 %
Immature Granulocytes: 1 %
Lymphocytes Relative: 19 %
Lymphs Abs: 1.2 10*3/uL (ref 0.7–4.0)
Monocytes Absolute: 0.7 10*3/uL (ref 0.1–1.0)
Monocytes Relative: 11 %
Neutro Abs: 4.2 10*3/uL (ref 1.7–7.7)
Neutrophils Relative %: 68 %

## 2019-09-24 LAB — APTT: aPTT: 33 seconds (ref 24–36)

## 2019-09-24 LAB — ETHANOL: Alcohol, Ethyl (B): <10 mg/dL (ref <10)

## 2019-09-24 MED ORDER — LORAZEPAM 2 MG/ML IJ SOLN
1.0000 mg | Freq: Once | INTRAMUSCULAR | Status: AC
  Administered 2019-09-24: 1 mg via INTRAVENOUS
  Filled 2019-09-24: qty 1

## 2019-09-24 NOTE — ED Notes (Signed)
[facility] Neurology (Transfer Line) called @ 1834-[address] [place], [place] called by Levada Dy

## 2019-09-24 NOTE — ED Provider Notes (Signed)
[facility] EMERGENCY DEPARTMENT Provider Note   CSN: [id number] Arrival date & time: 09/24/19  1719     History Chief Complaint  Patient presents with  . Neurologic Problem    Mackenzie Patterson is a 49 y.o. female presenting for evaluation of left arm weakness, left foot numbness, and speech difficulty.  Patient states between 430 and 5 she was at a restaurant when she developed symptoms.  She states it began with her left arm becoming weak and tingling.  She then developed numbness in her left foot and speech difficulties.  Patient states she was having difficulty getting her words out, was not slurring or substituting the wrong word.  She cannot state how long her symptoms lasted before they started to improve.  At this point, patient reports some residual tingling at the bottom of her left foot, and feels her speech is improving but not completely back to baseline.  Her left arm weakness has completely resolved.  Patient reports history of similar events, thought to be TIAs.  She is currently on Plavix, aspirin was discontinued recently.  She has been taking all her medications as prescribed.  She follows with [facility] neurology, has an appointment on Friday (in 3 days).  Reports associated left-sided occipital headache which began after her symptoms.  It is mild at this time.  She denies recent fall, trauma, or injury.  She denies fever, chills, vision changes, chest pain, shortness breath, nausea, vomiting, abdominal pain, urinary symptoms, abnormal bowel movements.  Additional history obtained from chart review.  Patient with a history of asthma, hypertension, diabetes, VSD, previous CVA.  She has had for TIA ED visits in the past year.  Per recent neurology note, question TIA versus complex migraine.  HPI     Past Medical History:  Diagnosis Date  . Asthma   . Breast discharge 01/10/2017  . Congenital ventricular septal defect   . Diabetes mellitus without complication  ([place])   . Heart murmur   . Hypertension   . Obesity   . Pulmonary hypertension (HCC)    mild   . Tricuspid regurgitation   . VSD (ventricular septal defect), perimembranous    Qp:Qs 1.7:1 by catheterization     Patient Active Problem List   Diagnosis Date Noted  . Adenomyosis 08/14/2018  . Abnormal uterine bleeding (AUB) 08/14/2018  . Fibroids 05/10/2018  . 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 TIA (transient ischemic attack) 10/24/2017  . Tricuspid regurgitation   . Asthma   . VSD (ventricular septal defect), perimembranous 09/03/2013  . Pulmonary hypertension ([place]) 09/03/2013  . Essential hypertension 08/06/2013  . Diabetes mellitus ([place]) 08/06/2013  . Obesity 08/06/2013  . Hypertension 07/27/2012  . 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 Perimembranous ventricular septal defect 07/27/2012    Past Surgical History:  Procedure Laterality Date  . CARDIAC CATHETERIZATION [name] 10/30/2014   Procedure: Right Heart Cath;  Surgeon: [name], MD;  Location: [facility] INVASIVE CV LAB CUPID;  Service: Cardiovascular;  Laterality: [name];  . CHOLECYSTECTOMY  2018  . CYST EXCISION    . LEFT AND RIGHT HEART CATHETERIZATION WITH CORONARY ANGIOGRAM [name] 09/11/2013   Procedure: LEFT AND RIGHT HEART CATHETERIZATION WITH CORONARY ANGIOGRAM;  Surgeon: [name], MD;  Location: [facility] CATH LAB;  Service: Cardiovascular;  Laterality: [name];  . RIGHT HEART CATH [name] 09/02/2016   Procedure: Right Heart Cath;  Surgeon: [name], MD;  Location: [facility] CV LAB;  Service: Cardiovascular;  Laterality: [name];  .  TUBAL LIGATION       OB History    Gravida  3   Para  2   Term  1   Preterm  1   AB  1   Living  3     SAB  0   TAB  1   Ectopic  0   Multiple  1   Live Births  3           Family History  Problem Relation Age of Onset  . Cancer Other   . Hyperlipidemia Other   . Hypertension Other   . Asthma Other   . Diabetes Mother   . Diabetes Father       Social History   Tobacco Use  . Smoking status: Never Smoker  . Smokeless tobacco: Never Used  Substance Use Topics  . Alcohol use: No  . Drug use: No    Home Medications Prior to Admission medications   Medication Sig Start Date End Date Taking? Authorizing Provider  Accu-Chek FastClix Lancets MISC USE 2 TIMES DAILY AS DIRECTED 07/26/19  Yes Kerin Perna, NP  albuterol (VENTOLIN HFA) 108 (90 Base) MCG/ACT inhaler Inhale 2 puffs into the lungs every 6 (six) hours as needed for wheezing or shortness of breath. 06/27/19  Yes Kerin Perna, NP  amLODipine (NORVASC) 5 MG tablet Take 1 tablet (5 mg total) by mouth daily. 06/27/19  Yes Kerin Perna, NP  atorvastatin (LIPITOR) 40 MG tablet Take 1 tablet (40 mg total) by mouth daily. 08/04/19  Yes Chundi, Verne Spurr, MD  budesonide-formoterol (SYMBICORT) 160-4.5 MCG/ACT inhaler Inhale 2 puffs into the lungs 2 (two) times daily. 06/27/19  Yes Kerin Perna, NP  carvedilol (COREG) 6.25 MG tablet Take 6.25 mg by mouth 2 (two) times daily. 08/17/19  Yes [provider]  cetirizine (ZYRTEC) 10 MG tablet Take 1 tablet (10 mg total) by mouth daily. At bedtime as needed 07/24/18  Yes Fulp, Cammie, MD  clopidogrel (PLAVIX) 75 MG tablet Take 1 tablet (75 mg total) by mouth daily. 08/05/19  Yes Chundi, Vahini, MD  glucose blood ([employer] GLUCOSE METER TEST STRIPS) test strip Use as instructed 07/30/19  Yes Edwards, Michelle P, NP  JANUMET 50-1000 MG tablet Take 1 tablet by mouth 2 (two) times daily. 09/18/19  Yes [provider]  triamcinolone cream (KENALOG) 0.1 % Apply 1 application topically 2 (two) times daily. 07/26/19  Yes Kerin Perna, NP  ammonium lactate (LAC-HYDRIN) 12 % lotion Apply 1 application topically as needed for dry skin.  09/17/19   [provider]  aspirin EC 81 MG tablet Take 1 tablet (81 mg total) by mouth daily. Patient not taking: Reported on 09/24/2019 07/24/18   Fulp, Ander Gaster, MD   topiramate (TOPAMAX) 25 MG tablet Take 75 mg by mouth at bedtime. 09/06/19   [provider]    Allergies    Penicillins, Tape, Other, Drug ingredient [benzonatate], and Latex  Review of Systems   Review of Systems  Neurological: Positive for speech difficulty (improving), weakness (resolved), numbness (improving) and headaches.  All other systems reviewed and are negative.   Physical Exam Updated Vital Signs BP 133/83 (BP Location: Right Arm)   Pulse (!) 104   Temp 98.1 F (36.7 C) (Oral)   Resp 18   SpO2 100%   Physical Exam Vitals and nursing note reviewed.  Constitutional:      General: She is not in acute distress.    Appearance: She is well-developed.  Comments: Resting comfortably in the bed in no acute distress  HENT:     Head: Normocephalic and atraumatic.  Eyes:     Extraocular Movements: Extraocular movements intact.     Conjunctiva/sclera: Conjunctivae normal.     Pupils: Pupils are equal, round, and reactive to light.  Cardiovascular:     Rate and Rhythm: Normal rate and regular rhythm.     Pulses: Normal pulses.  Pulmonary:     Effort: Pulmonary effort is normal. No respiratory distress.     Breath sounds: Normal breath sounds. No wheezing.  Abdominal:     General: There is no distension.     Palpations: Abdomen is soft. There is no mass.     Tenderness: There is no abdominal tenderness. There is no guarding or rebound.  Musculoskeletal:        General: Normal range of motion.     Cervical back: Normal range of motion and neck supple.     Comments: Strength and sensation intact x4.  Skin:    General: Skin is warm and dry.     Capillary Refill: Capillary refill takes less than 2 seconds.  Neurological:     Mental Status: She is alert and oriented to person, place, and time.     GCS: GCS eye subscore is 4. GCS verbal subscore is 5. GCS motor subscore is 6.     Cranial Nerves: Cranial nerves are intact. No facial asymmetry.     Sensory: No  sensory deficit.     Motor: No weakness or pronator drift.     Coordination: Coordination normal. Finger-Nose-Finger Test and Heel to [facility] Test normal.     Comments: CN intact.  Nose to finger intact.  Fine movement and coordination intact.  Negative pronator drift.  Normal heel-to-shin.  Patient mistakenly stated the year was 2001, and when I repeated to verify, she stated yes. However   Otherwise, answered questions correctly.  Patient speech is may be slightly slow/delayed, but this may be baseline.     ED Results / Procedures / Treatments   Labs (all labs ordered are listed, but only abnormal results are displayed) Labs Reviewed  CBC - Abnormal; Notable for the following components:      Result Value   Hemoglobin 9.3 (*)    HCT 31.9 (*)    MCV 75.4 (*)    MCH 22.0 (*)    MCHC 29.2 (*)    RDW 16.5 (*)    All other components within normal limits  COMPREHENSIVE METABOLIC PANEL - Abnormal; Notable for the following components:   Glucose, Bld 147 (*)    All other components within normal limits  ETHANOL  PROTIME-INR  APTT  DIFFERENTIAL  RAPID URINE DRUG SCREEN, HOSP PERFORMED  URINALYSIS, ROUTINE W REFLEX MICROSCOPIC  I-STAT CHEM 8, ED  I-STAT BETA HCG BLOOD, ED ([facility], [facility], [facility] ONLY)    EKG None  Radiology CT HEAD WO CONTRAST  Result Date: 09/24/2019 CLINICAL DATA:  TIA. Acute onset left-sided weakness and difficulty speaking today, now largely resolved. EXAM: CT HEAD WITHOUT CONTRAST TECHNIQUE: Contiguous axial images were obtained from the base of the skull through the vertex without intravenous contrast. COMPARISON:  Head MRI 09/24/2019 FINDINGS: Brain: There is no evidence of acute infarct, intracranial hemorrhage, mass, midline shift, or extra-axial fluid collection. A small chronic left cerebellar infarct is again noted. The ventricles and sulci are normal. Vascular: No hyperdense vessel. Skull: No fracture or focal osseous lesion. Sinuses/Orbits: Paranasal sinuses and  mastoid  air cells are clear. Unremarkable orbits. Other: None. IMPRESSION: 1. No evidence of acute intracranial abnormality. 2. Small chronic left cerebellar infarct. Electronically Signed   By: Logan Bores M.D.   On: 09/24/2019 20:03   MR BRAIN WO CONTRAST  Result Date: 09/24/2019 CLINICAL DATA:  Acute left-sided weakness and difficulty speaking today with symptoms now largely resolved. EXAM: MRI HEAD WITHOUT CONTRAST TECHNIQUE: Multiplanar, multiecho pulse sequences of the brain and surrounding structures were obtained without intravenous contrast. COMPARISON:  08/04/2019 FINDINGS: Brain: There is no evidence of acute infarct, intracranial hemorrhage, mass, midline shift, or extra-axial fluid collection. The ventricles and sulci are normal. A small chronic left cerebellar infarct is unchanged. The brain is otherwise normal in signal. Vascular: Major intracranial vascular flow voids are preserved. Skull and upper cervical spine: No focal marrow lesion. Chronically diminished bone marrow T1 signal intensity diffusely compatible with known anemia. Sinuses/Orbits: Unremarkable orbits. Paranasal sinuses and mastoid air cells are clear. Other: None. IMPRESSION: 1. No acute intracranial abnormality. 2. Small chronic left cerebellar infarct. Electronically Signed   By: Logan Bores M.D.   On: 09/24/2019 19:42    Procedures Procedures (including critical care time)  Medications Ordered in ED Medications  LORazepam (ATIVAN) injection 1 mg (1 mg Intravenous Given 09/24/19 1854)    ED Course  I have reviewed the triage vital signs and the nursing notes.  Pertinent labs & imaging results that were available during my care of the patient were reviewed by me and considered in my medical decision making (see chart for details).    MDM Rules/Calculators/A&P                      Presenting for evaluation of neurologic deficits.  On exam, patient appears nontoxic.  Speech is slightly delayed, but this may be  baseline.  Answered question about year wrong, but was able to state the date.  Otherwise no neuro deficits.  Patient reports symptoms are improving.  Likely TIA.  As patient also has a headache, consider complex migraine.  Patient has been seen for TIAs couple times in the past year.  Follows with neurology.  Will obtain labs to ensure no underlying metabolic abnormality.  Will obtain CT head to rule out bleed.  Will consult with neurology.  Case discussed with attending, Dr. Sabra Heck evaluated the patient.  Discussed with Dr. Cheral Marker from neurology who recommends MRI.  If normal, patient can be discharged.  Patient told EMT that her neurologist at [facility] said there is a bed available for her if she can be transferred.  I discussed with patient, this is unclear as to whether or not this is what was said.  Will consult with [facility] neurology for further guidance.   Discussed with Dr. De Blanch from [facility] neurology, who did not feel patient needed to be transferred today.  He felt there was no benefit to admitting the patient for further evaluation, and said patient can follow-up closely with her outpatient neurologist.  CT head and MRI negative for acute findings.  On reassessment, patient reports symptoms have completely resolved.  Discussed findings with patient.  Discussed importance of close follow-up on Friday at her scheduled appointment.  At this time, patient appears safe for discharge.  Return precautions given.  Patient states he understands and agrees to plan.  Final Clinical Impression(s) / ED Diagnoses Final diagnoses:  Left arm weakness  Numbness of left foot  Aphasia    Rx / DC Orders ED Discharge Orders  None       Franchot Heidelberg, PA-C 09/24/19 2036    Noemi Chapel, MD 09/25/19 1159

## 2019-09-24 NOTE — ED Notes (Signed)
Patient to MRI scanner  

## 2019-09-24 NOTE — ED Provider Notes (Signed)
This patient presents with recurrent left arm numbness and weakness as well as left leg numbness and some difficulty speaking, this completely resolved by the time she got to the hospital and on my exam her neurologic exam is completely reassuring.  She does have a loud systolic murmur consistent with a ventricular septal defect  We will consult with neurology at [facility] where the patient has an established relationship.  According to our neurologist, Dr. Cheral Marker an MRI has been requested.  Medical screening examination/treatment/procedure(s) were conducted as a shared visit with non-physician practitioner(s) and myself.  I personally evaluated the patient during the encounter.  Clinical Impression:   Final diagnoses:  Left arm weakness  Numbness of left foot  Aphasia         Noemi Chapel, MD 09/25/19 1159

## 2019-09-24 NOTE — ED Triage Notes (Signed)
Pt from home via EMS had sudden onset of L sided weakness and difficulty speaking at 1635 today. On EMS arrival, pts symptoms had mostly resolved.

## 2019-09-24 NOTE — Discharge Instructions (Addendum)
Follow-up with your neurologist as scheduled on Friday. Return to the emergency room with any new, worsening, concerning symptoms.

## 2019-09-27 DIAGNOSIS — Z136 Encounter for screening for cardiovascular disorders: Secondary | ICD-10-CM | POA: Diagnosis not present

## 2019-09-27 DIAGNOSIS — G459 Transient cerebral ischemic attack, unspecified: Secondary | ICD-10-CM | POA: Diagnosis not present

## 2019-10-03 ENCOUNTER — Other Ambulatory Visit: Payer: Self-pay

## 2019-10-03 ENCOUNTER — Encounter (INDEPENDENT_AMBULATORY_CARE_PROVIDER_SITE_OTHER): Payer: Self-pay | Admitting: Primary Care

## 2019-10-03 ENCOUNTER — Ambulatory Visit (INDEPENDENT_AMBULATORY_CARE_PROVIDER_SITE_OTHER): Payer: Medicaid Other | Admitting: Primary Care

## 2019-10-03 VITALS — BP 120/82 | HR 96 | Temp 97.3°F | Ht 59.0 in | Wt 159.6 lb

## 2019-10-03 DIAGNOSIS — J4520 Mild intermittent asthma, uncomplicated: Secondary | ICD-10-CM

## 2019-10-03 DIAGNOSIS — R21 Rash and other nonspecific skin eruption: Secondary | ICD-10-CM

## 2019-10-03 DIAGNOSIS — J309 Allergic rhinitis, unspecified: Secondary | ICD-10-CM

## 2019-10-03 DIAGNOSIS — E119 Type 2 diabetes mellitus without complications: Secondary | ICD-10-CM | POA: Diagnosis not present

## 2019-10-03 DIAGNOSIS — I10 Essential (primary) hypertension: Secondary | ICD-10-CM

## 2019-10-03 MED ORDER — BUDESONIDE-FORMOTEROL FUMARATE 160-4.5 MCG/ACT IN AERO: 2.0000 | INHALATION_SPRAY | Freq: Two times a day (BID) | RESPIRATORY_TRACT | 5 refills | Status: DC

## 2019-10-03 MED ORDER — CETIRIZINE HCL 10 MG PO TABS: 10.0000 mg | ORAL_TABLET | Freq: Every day | ORAL | 11 refills | Status: DC

## 2019-10-03 MED ORDER — TRIAMCINOLONE ACETONIDE 0.1 % EX CREA: 1.0000 "application " | TOPICAL_CREAM | Freq: Two times a day (BID) | CUTANEOUS | 0 refills | Status: DC

## 2019-10-03 MED ORDER — JANUMET 50-1000 MG PO TABS: 1.0000 | ORAL_TABLET | ORAL | 1 refills | Status: DC

## 2019-10-03 MED ORDER — AMLODIPINE BESYLATE 5 MG PO TABS: 5.0000 mg | ORAL_TABLET | Freq: Every day | ORAL | 1 refills | Status: DC

## 2019-10-03 MED ORDER — JANUMET 50-1000 MG PO TABS: 1.0000 | ORAL_TABLET | Freq: Two times a day (BID) | ORAL | 1 refills | Status: DC

## 2019-10-03 MED ORDER — ALBUTEROL SULFATE HFA 108 (90 BASE) MCG/ACT IN AERS: 2.0000 | INHALATION_SPRAY | Freq: Four times a day (QID) | RESPIRATORY_TRACT | 1 refills | Status: DC | PRN

## 2019-10-03 MED ORDER — AMMONIUM LACTATE 12 % EX LOTN: 1.0000 "application " | TOPICAL_LOTION | CUTANEOUS | 1 refills | Status: DC | PRN

## 2019-10-03 NOTE — Patient Instructions (Signed)
Rash, Adult  A rash is a change in the color of your skin. A rash can also change the way your skin feels. There are many different conditions and factors that can cause a rash. Follow these instructions at home: The goal of treatment is to stop the itching and keep the rash from spreading. Watch for any changes in your symptoms. Let your doctor know about them. Follow these instructions to help with your condition: Medicine Take or apply over-the-counter and prescription medicines only as told by your doctor. These may include medicines:  To treat red or swollen skin (corticosteroid creams).  To treat itching.  To treat an allergy (oral antihistamines).  To treat very bad symptoms (oral corticosteroids).  Skin care  Put cool cloths (compresses) on the affected areas.  Do not scratch or rub your skin.  Avoid covering the rash. Make sure that the rash is exposed to air as much as possible. Managing itching and discomfort  Avoid hot showers or baths. These can make itching worse. A cold shower may help.  Try taking a bath with: ? Epsom salts. You can get these at your local pharmacy or grocery store. Follow the instructions on the package. ? Baking soda. Pour a small amount into the bath as told by your doctor. ? Colloidal oatmeal. You can get this at your local pharmacy or grocery store. Follow the instructions on the package.  Try putting baking soda paste onto your skin. Stir water into baking soda until it gets like a paste.  Try putting on a lotion that relieves itchiness (calamine lotion).  Keep cool and out of the sun. Sweating and being hot can make itching worse. General instructions   Rest as needed.  Drink enough fluid to keep your pee (urine) pale yellow.  Wear loose-fitting clothing.  Avoid scented soaps, detergents, and perfumes. Use gentle soaps, detergents, perfumes, and other cosmetic products.  Avoid anything that causes your rash. Keep a journal to  help track what causes your rash. Write down: ? What you eat. ? What cosmetic products you use. ? What you drink. ? What you wear. This includes jewelry.  Keep all follow-up visits as told by your doctor. This is important. Contact a doctor if:  You sweat at night.  You lose weight.  You pee (urinate) more than normal.  You pee less than normal, or you notice that your pee is a darker color than normal.  You feel weak.  You throw up (vomit).  Your skin or the whites of your eyes look yellow (jaundice).  Your skin: ? Tingles. ? Is numb.  Your rash: ? Does not go away after a few days. ? Gets worse.  You are: ? More thirsty than normal. ? More tired than normal.  You have: ? New symptoms. ? Pain in your belly (abdomen). ? A fever. ? Watery poop (diarrhea). Get help right away if:  You have a fever and your symptoms suddenly get worse.  You start to feel mixed up (confused).  You have a very bad headache or a stiff neck.  You have very bad joint pains or stiffness.  You have jerky movements that you cannot control (seizure).  Your rash covers all or most of your body. The rash may or may not be painful.  You have blisters that: ? Are on top of the rash. ? Grow larger. ? Grow together. ? Are painful. ? Are inside your nose or mouth.  You have a rash   that: ? Looks like purple pinprick-sized spots all over your body. ? Has a "bull's eye" or looks like a target. ? Is red and painful, causes your skin to peel, and is not from being in the sun too long. Summary  A rash is a change in the color of your skin. A rash can also change the way your skin feels.  The goal of treatment is to stop the itching and keep the rash from spreading.  Take or apply over-the-counter and prescription medicines only as told by your doctor.  Contact a doctor if you have new symptoms or symptoms that get worse.  Keep all follow-up visits as told by your doctor. This is  important. This information is not intended to replace advice given to you by your health care provider. Make sure you discuss any questions you have with your health care provider. Document Revised: 10/05/2018 Document Reviewed: 01/15/2018 Elsevier Patient Education  2020 [employer]  

## 2019-10-03 NOTE — Progress Notes (Signed)
Established Patient Office Visit  Subjective:  [name], female    DOB: Mar 07, 1971  Age: 49 y.o. MRN: [id number]  CC:  Chief Complaint  Patient presents with  . Rash  . dark spots  . Medication Refill    lotion and zyrtec     HPI [name] is a 49 year old African American female presents today for an acute visit she has a rash on her left side  pruritis no pain and does not follow a dermatone. Requesting refills .   Past Medical History:  Diagnosis Date  . Asthma   . Breast discharge 01/10/2017  . Congenital ventricular septal defect   . Diabetes mellitus without complication ([place])   . Heart murmur   . Hypertension   . Obesity   . Pulmonary hypertension (HCC)    mild   . Tricuspid regurgitation   . VSD (ventricular septal defect), perimembranous    Qp:Qs 1.7:1 by catheterization     Past Surgical History:  Procedure Laterality Date  . CARDIAC CATHETERIZATION [name] 10/30/2014   Procedure: Right Heart Cath;  Surgeon: [name], MD;  Location: [facility] INVASIVE CV LAB CUPID;  Service: Cardiovascular;  Laterality: [name];  . CHOLECYSTECTOMY  2018  . CYST EXCISION    . LEFT AND RIGHT HEART CATHETERIZATION WITH CORONARY ANGIOGRAM [name] 09/11/2013   Procedure: LEFT AND RIGHT HEART CATHETERIZATION WITH CORONARY ANGIOGRAM;  Surgeon: [name], MD;  Location: [facility] CATH LAB;  Service: Cardiovascular;  Laterality: [name];  . RIGHT HEART CATH [name] 09/02/2016   Procedure: Right Heart Cath;  Surgeon: [name], MD;  Location: [facility] CV LAB;  Service: Cardiovascular;  Laterality: [name];  . TUBAL LIGATION      Family History  Problem Relation Age of Onset  . Cancer Other   . Hyperlipidemia Other   . Hypertension Other   . Asthma Other   . Diabetes Mother   . Diabetes Father     Social History   Socioeconomic History  . Marital status: Single    Spouse name: Not on file  . Number of children: Not on file  . Years of education: Not on file  . Highest  education level: Not on file  Occupational History  . Not on file  Tobacco Use  . Smoking status: Never Smoker  . Smokeless tobacco: Never Used  Substance and Sexual Activity  . Alcohol use: No  . Drug use: No  . Sexual activity: Yes    Birth control/protection: Surgical  Other Topics Concern  . Not on file  Social History Narrative   Lives with boyfriend. On disability.    Social Determinants of Health   Financial Resource Strain:   . Difficulty of Paying Living Expenses:   Food Insecurity:   . Worried About Charity fundraiser in the Last Year:   . Arboriculturist in the Last Year:   Transportation Needs:   . Film/video editor (Medical):   Marland Kitchen Lack of Transportation (Non-Medical):   Physical Activity:   . Days of Exercise per Week:   . Minutes of Exercise per Session:   Stress:   . Feeling of Stress :   Social Connections:   . Frequency of Communication with Friends and Family:   . Frequency of Social Gatherings with Friends and Family:   . Attends Religious Services:   . Active Member of Clubs or Organizations:   . Attends Archivist Meetings:   .  Marital Status:   Intimate Partner Violence:   . Fear of Current or Ex-Partner:   . Emotionally Abused:   Marland Kitchen Physically Abused:   . Sexually Abused:     Outpatient Medications Prior to Visit  Medication Sig Dispense Refill  . atorvastatin (LIPITOR) 40 MG tablet Take 1 tablet (40 mg total) by mouth daily. 30 tablet 0  . clopidogrel (PLAVIX) 75 MG tablet Take 1 tablet (75 mg total) by mouth daily. 28 tablet 0  . amLODipine (NORVASC) 5 MG tablet Take 1 tablet (5 mg total) by mouth daily. 90 tablet 1  . ammonium lactate (LAC-HYDRIN) 12 % lotion Apply 1 application topically as needed for dry skin.     . budesonide-formoterol (SYMBICORT) 160-4.5 MCG/ACT inhaler Inhale 2 puffs into the lungs 2 (two) times daily. 1 Inhaler 5  . carvedilol (COREG) 6.25 MG tablet Take 6.25 mg by mouth 2 (two) times daily.    .  cetirizine (ZYRTEC) 10 MG tablet Take 1 tablet (10 mg total) by mouth daily. At bedtime as needed 30 tablet 11  . JANUMET 50-1000 MG tablet Take 1 tablet by mouth 2 (two) times daily.    . Accu-Chek FastClix Lancets MISC USE 2 TIMES DAILY AS DIRECTED 100 each 11  . glucose blood ([facility] GLUCOSE METER TEST STRIPS) test strip Use as instructed 100 each 12  . topiramate (TOPAMAX) 25 MG tablet Take 75 mg by mouth at bedtime.    Marland Kitchen albuterol (VENTOLIN HFA) 108 (90 Base) MCG/ACT inhaler Inhale 2 puffs into the lungs every 6 (six) hours as needed for wheezing or shortness of breath. 6.7 g 1  . aspirin EC 81 MG tablet Take 1 tablet (81 mg total) by mouth daily. (Patient not taking: Reported on 09/24/2019) 90 tablet 3  . triamcinolone cream (KENALOG) 0.1 % Apply 1 application topically 2 (two) times daily. (Patient not taking: Reported on 10/03/2019) 30 g 0   No facility-administered medications prior to visit.    Allergies  Allergen Reactions  . Penicillins Anaphylaxis    Has patient had a PCN reaction causing immediate rash, facial/tongue/throat swelling, SOB or lightheadedness with hypotension: Yes Has patient had a PCN reaction causing severe rash involving mucus membranes or skin necrosis: Yes Has patient had a PCN reaction that required hospitalization Yes- went to ED, was not admitted Has patient had a PCN reaction occurring within the last 10 years: No- longer then 10 years If all of the above answers are "NO", then may proceed with Cephalosporin use.   . Tape Rash    Other reaction(s): Other (See Comments) Burns skin  . Other Rash    Other reaction(s): Other (See Comments) **EKG GEL PADS**  "Burns my skin" Pt states she is allergic to a blood pressure medication, unsure.  **EKG GEL PADS**  "Burns my skin"  . Drug Ingredient [Benzonatate] Other (See Comments) and Cough    Makes cough worse  . Latex Rash    ROS Review of Systems  Allergic/Immunologic: Positive for food allergies.  All  other systems reviewed and are negative.     Objective:    Physical Exam  Constitutional: She is oriented to person, place, and time. She appears well-developed and well-nourished.  HENT:  Head: Normocephalic.  Cardiovascular: Normal rate and regular rhythm.  Murmur heard. Pulmonary/Chest: Effort normal and breath sounds normal.  Abdominal: Soft. Bowel sounds are normal.  Musculoskeletal:        General: Normal range of motion.     Cervical back: Neck  supple.  Neurological: She is alert and oriented to person, place, and time.  Skin: Skin is warm and dry.  Psychiatric: She has a normal mood and affect. Her behavior is normal. Judgment and thought content normal.    BP 120/82 (BP Location: Left Arm, Patient Position: Sitting, Cuff Size: Normal)   Pulse 96   Temp (!) 97.3 F (36.3 C) (Temporal)   Ht 4\' 11"  (1.499 m)   Wt 159 lb 9.6 oz (72.4 kg)   LMP 09/03/2019 (Approximate)   SpO2 100%   BMI 32.24 kg/m  Wt Readings from Last 3 Encounters:  10/03/19 159 lb 9.6 oz (72.4 kg)  08/04/19 158 lb 11.7 oz (72 kg)  07/26/19 163 lb 12.8 oz (74.3 kg)     Health Maintenance Due  Topic Date Due  . PNEUMOCOCCAL POLYSACCHARIDE VACCINE AGE 82-64 HIGH RISK  Never done  . OPHTHALMOLOGY EXAM  Never done  . URINE MICROALBUMIN  01/17/2019    There are no preventive care reminders to display for this patient.  Lab Results  Component Value Date   TSH 2.180 11/14/2016   Lab Results  Component Value Date   WBC 6.1 09/24/2019   HGB 9.3 (L) 09/24/2019   HCT 31.9 (L) 09/24/2019   MCV 75.4 (L) 09/24/2019   PLT 280 09/24/2019   Lab Results  Component Value Date   NA 135 09/24/2019   K 3.6 09/24/2019   CO2 22 09/24/2019   GLUCOSE 147 (H) 09/24/2019   BUN 11 09/24/2019   CREATININE 0.91 09/24/2019   BILITOT 1.1 09/24/2019   ALKPHOS 49 09/24/2019   AST 30 09/24/2019   ALT 25 09/24/2019   PROT 6.9 09/24/2019   ALBUMIN 3.9 09/24/2019   CALCIUM 8.9 09/24/2019   ANIONGAP 11  09/24/2019   GFR 105.36 10/27/2014   Lab Results  Component Value Date   CHOL 160 08/04/2019   Lab Results  Component Value Date   HDL 43 08/04/2019   Lab Results  Component Value Date   LDLCALC 84 08/04/2019   Lab Results  Component Value Date   TRIG 163 (H) 08/04/2019   Lab Results  Component Value Date   CHOLHDL 3.7 08/04/2019   Lab Results  Component Value Date   HGBA1C 5.9 (H) 08/04/2019      Assessment & Plan:  Jammi was seen today for rash, dark spots and medication refill.  Diagnoses and all orders for this visit:  Essential hypertension Well controlled on amlodipine 5mg  daily and   low-sodium, DASH diet, medication compliance, 150 minutes of moderate intensity exercise per week. Discussed medication compliance, adverse effects. -     amLODipine (NORVASC) 5 MG tablet; Take 1 tablet (5 mg total) by mouth daily.  Mild intermittent asthma without complication Controlled with SABA/LABA  -     albuterol (VENTOLIN HFA) 108 (90 Base) MCG/ACT inhaler; Inhale 2 puffs into the lungs every 6 (six) hours as needed for wheezing or shortness of breath. -     budesonide-formoterol (SYMBICORT) 160-4.5 MCG/ACT inhaler; Inhale 2 puffs into the lungs 2 (two) times daily.  Allergic rhinitis, unspecified seasonality, unspecified trigger [place] encourage to take daily  -     cetirizine (ZYRTEC) 10 MG tablet; Take 1 tablet (10 mg total) by mouth daily. At bedtime as needed  Rash and nonspecific skin eruption triamcinolone cream (KENALOG) 0.1 %; Apply 1 application topically 2 (two) times daily. As needed    Controlled type 2 diabetes mellitus without complication, without long-term current use  of insulin ([place]) Decreased [place] to once daily A1C 5.9 . Well controlled continue to monitor foods that are high in carbohydrates are the following rice, potatoes, breads, sugars, and pastas.  Reduction in the intake (eating) will assist in lowering your blood sugars.  Other  orders -     JANUMET 50-1000 MG tablet; Take 1 tablet by mouth changed to once daily A1C 5.9-     ammonium lactate (LAC-HYDRIN) 12 % lotion; Apply 1 application topically as needed for dry skin. -     triamcinolone cream (KENALOG) 0.1 %; Apply 1 application topically 2 (two) times daily.   No orders of the defined types were placed in this encounter.   Follow-up: Return in about 9 months (around 07/04/2020) for DM in person A1C.    Kerin Perna, NP

## 2019-10-09 DIAGNOSIS — G43009 Migraine without aura, not intractable, without status migrainosus: Secondary | ICD-10-CM | POA: Diagnosis not present

## 2019-10-09 DIAGNOSIS — Z8673 Personal history of transient ischemic attack (TIA), and cerebral infarction without residual deficits: Secondary | ICD-10-CM | POA: Diagnosis not present

## 2019-10-09 DIAGNOSIS — E785 Hyperlipidemia, unspecified: Secondary | ICD-10-CM | POA: Diagnosis not present

## 2019-10-14 DIAGNOSIS — H524 Presbyopia: Secondary | ICD-10-CM | POA: Diagnosis not present

## 2019-10-14 DIAGNOSIS — H5213 Myopia, bilateral: Secondary | ICD-10-CM | POA: Diagnosis not present

## 2019-10-14 DIAGNOSIS — E119 Type 2 diabetes mellitus without complications: Secondary | ICD-10-CM | POA: Diagnosis not present

## 2019-10-14 DIAGNOSIS — H1045 Other chronic allergic conjunctivitis: Secondary | ICD-10-CM | POA: Diagnosis not present

## 2019-10-14 DIAGNOSIS — H52223 Regular astigmatism, bilateral: Secondary | ICD-10-CM | POA: Diagnosis not present

## 2019-10-15 DIAGNOSIS — H5213 Myopia, bilateral: Secondary | ICD-10-CM | POA: Diagnosis not present

## 2019-11-07 ENCOUNTER — Telehealth: Payer: Self-pay

## 2019-11-07 NOTE — Telephone Encounter (Signed)
DISREGARD PREVIOUS-PA FOR JANUMET APPROVED THRU 11/01/20 THROUGH MEDICAID

## 2019-11-07 NOTE — Telephone Encounter (Signed)
PRIOR AUTH FOR JANUMET IS APPROVED THRU MEDICAID THRU 11/06/20

## 2019-11-09 DIAGNOSIS — H52223 Regular astigmatism, bilateral: Secondary | ICD-10-CM | POA: Diagnosis not present

## 2019-11-09 DIAGNOSIS — H524 Presbyopia: Secondary | ICD-10-CM | POA: Diagnosis not present

## 2019-11-13 ENCOUNTER — Other Ambulatory Visit (INDEPENDENT_AMBULATORY_CARE_PROVIDER_SITE_OTHER): Payer: Self-pay | Admitting: Primary Care

## 2019-11-13 DIAGNOSIS — J4520 Mild intermittent asthma, uncomplicated: Secondary | ICD-10-CM

## 2019-11-13 MED ORDER — SPACER/AERO-HOLDING CHAMBERS DEVI: 1.0000 | Freq: Every day | 0 refills | Status: DC | PRN

## 2019-11-14 ENCOUNTER — Telehealth (INDEPENDENT_AMBULATORY_CARE_PROVIDER_SITE_OTHER): Payer: Self-pay

## 2019-11-14 NOTE — Telephone Encounter (Signed)
Patient is aware that spacer was sent on yesterday. She would like to be referred to an asthma specialist or to pulmonology.

## 2019-11-14 NOTE — Telephone Encounter (Signed)
Patient called requesting a spacer for her inhaler. States she can no tget any medication out of the inhaler and feels like the spacer has a hole.   [facility] [address]   Please advice [phone]

## 2019-11-20 ENCOUNTER — Other Ambulatory Visit (INDEPENDENT_AMBULATORY_CARE_PROVIDER_SITE_OTHER): Payer: Self-pay | Admitting: Primary Care

## 2019-11-20 DIAGNOSIS — J4520 Mild intermittent asthma, uncomplicated: Secondary | ICD-10-CM

## 2019-11-22 DIAGNOSIS — G4719 Other hypersomnia: Secondary | ICD-10-CM | POA: Diagnosis not present

## 2019-11-22 DIAGNOSIS — Z8673 Personal history of transient ischemic attack (TIA), and cerebral infarction without residual deficits: Secondary | ICD-10-CM | POA: Diagnosis not present

## 2019-12-02 NOTE — Telephone Encounter (Signed)
error 

## 2019-12-05 ENCOUNTER — Other Ambulatory Visit: Payer: Self-pay

## 2019-12-05 ENCOUNTER — Ambulatory Visit (INDEPENDENT_AMBULATORY_CARE_PROVIDER_SITE_OTHER): Payer: Medicaid Other | Admitting: Primary Care

## 2019-12-05 ENCOUNTER — Encounter (INDEPENDENT_AMBULATORY_CARE_PROVIDER_SITE_OTHER): Payer: Self-pay | Admitting: Primary Care

## 2019-12-05 VITALS — BP 128/81 | HR 89 | Temp 97.5°F | Ht 59.0 in | Wt 158.4 lb

## 2019-12-05 DIAGNOSIS — R601 Generalized edema: Secondary | ICD-10-CM

## 2019-12-05 DIAGNOSIS — I10 Essential (primary) hypertension: Secondary | ICD-10-CM

## 2019-12-05 NOTE — Progress Notes (Signed)
Acute Office Visit  Subjective:    [name], female    DOB: 1971/04/05, 49 y.o.   MRN  HPI  [name] is a 49 year  Old female in today for concerns for bilateral lower extremity edema especially feet. Today there is no edema present but she has pictures she shared asked to download to my chart. Blood pressure controlled today . Denies shortness of breath, headaches, chest pain or lower extremity edema Past Medical History:  Diagnosis Date  . Asthma   . Breast discharge 01/10/2017  . Congenital ventricular septal defect   . Diabetes mellitus without complication ([place])   . Heart murmur   . Hypertension   . Obesity   . Pulmonary hypertension (HCC)    mild   . Tricuspid regurgitation   . VSD (ventricular septal defect), perimembranous    Qp:Qs 1.7:1 by catheterization     Past Surgical History:  Procedure Laterality Date  . CARDIAC CATHETERIZATION [name] 10/30/2014   Procedure: Right Heart Cath;  Surgeon: [name], MD;  Location: [facility] INVASIVE CV LAB CUPID;  Service: Cardiovascular;  Laterality: [name];  . CHOLECYSTECTOMY  2018  . CYST EXCISION    . LEFT AND RIGHT HEART CATHETERIZATION WITH CORONARY ANGIOGRAM [name] 09/11/2013   Procedure: LEFT AND RIGHT HEART CATHETERIZATION WITH CORONARY ANGIOGRAM;  Surgeon: [name], MD;  Location: [facility] CATH LAB;  Service: Cardiovascular;  Laterality: [name];  . RIGHT HEART CATH [name] 09/02/2016   Procedure: Right Heart Cath;  Surgeon: [name], MD;  Location: [place] CV LAB;  Service: Cardiovascular;  Laterality: [name];  . TUBAL LIGATION      Family History  Problem Relation Age of Onset  . Cancer Other   . Hyperlipidemia Other   . Hypertension Other   . Asthma Other   . Diabetes Mother   . Diabetes Father     Social History   Socioeconomic History  . Marital status: Single    Spouse name: Not on file  . Number of children: Not on file  . Years of education: Not on file  . Highest education level: Not on  file  Occupational History  . Not on file  Tobacco Use  . Smoking status: Never Smoker  . Smokeless tobacco: Never Used  Vaping Use  . Vaping Use: Never used  Substance and Sexual Activity  . Alcohol use: No  . Drug use: No  . Sexual activity: Yes    Birth control/protection: Surgical  Other Topics Concern  . Not on file  Social History Narrative   Lives with boyfriend. On disability.    Social Determinants of Health   Financial Resource Strain:   . Difficulty of Paying Living Expenses:   Food Insecurity:   . Worried About Charity fundraiser in the Last Year:   . Arboriculturist in the Last Year:   Transportation Needs:   . Film/video editor (Medical):   Marland Kitchen Lack of Transportation (Non-Medical):   Physical Activity:   . Days of Exercise per Week:   . Minutes of Exercise per Session:   Stress:   . Feeling of Stress :   Social Connections:   . Frequency of Communication with Friends and Family:   . Frequency of Social Gatherings with Friends and Family:   . Attends Religious Services:   . Active Member of Clubs or Organizations:   . Attends Archivist Meetings:   Marland Kitchen Marital Status:  Intimate Partner Violence:   . Fear of Current or Ex-Partner:   . Emotionally Abused:   Marland Kitchen Physically Abused:   . Sexually Abused:     Outpatient Medications Prior to Visit  Medication Sig Dispense Refill  . Accu-Chek FastClix Lancets MISC USE 2 TIMES DAILY AS DIRECTED 100 each 11  . albuterol (VENTOLIN HFA) 108 (90 Base) MCG/ACT inhaler Inhale 2 puffs into the lungs every 6 (six) hours as needed for wheezing or shortness of breath. 6.7 g 1  . amLODipine (NORVASC) 5 MG tablet Take 1 tablet (5 mg total) by mouth daily. 90 tablet 1  . ammonium lactate (LAC-HYDRIN) 12 % lotion Apply 1 application topically as needed for dry skin. 567 g 1  . budesonide-formoterol (SYMBICORT) 160-4.5 MCG/ACT inhaler Inhale 2 puffs into the lungs 2 (two) times daily. 1 Inhaler 5  . cetirizine  (ZYRTEC) 10 MG tablet Take 1 tablet (10 mg total) by mouth daily. At bedtime as needed 30 tablet 11  . clopidogrel (PLAVIX) 75 MG tablet Take 1 tablet (75 mg total) by mouth daily. 28 tablet 0  . glucose blood ([facility] GLUCOSE METER TEST STRIPS) test strip Use as instructed 100 each 12  . JANUMET 50-1000 MG tablet Take 1 tablet by mouth 1 day or 1 dose. Take 1 tablet daily after breakfast 90 tablet 1  . rosuvastatin (CRESTOR) 20 MG tablet Take 20 mg by mouth at bedtime.    Marland Kitchen Spacer/Aero-Holding Chambers DEVI 1 Container by Does not apply route daily as needed. 1 Device 0  . topiramate (TOPAMAX) 25 MG tablet Take 75 mg by mouth at bedtime.     . triamcinolone cream (KENALOG) 0.1 % Apply 1 application topically 2 (two) times daily. 453 g 0  . atorvastatin (LIPITOR) 40 MG tablet Take 1 tablet (40 mg total) by mouth daily. 30 tablet 0   No facility-administered medications prior to visit.    Allergies  Allergen Reactions  . Penicillins Anaphylaxis    Has patient had a PCN reaction causing immediate rash, facial/tongue/throat swelling, SOB or lightheadedness with hypotension: Yes Has patient had a PCN reaction causing severe rash involving mucus membranes or skin necrosis: Yes Has patient had a PCN reaction that required hospitalization Yes- went to ED, was not admitted Has patient had a PCN reaction occurring within the last 10 years: No- longer then 10 years If all of the above answers are "NO", then may proceed with Cephalosporin use.   . Tape Rash    Other reaction(s): Other (See Comments) Burns skin  . Other Rash    Other reaction(s): Other (See Comments) "Burns my skin" Pt states she is allergic to a blood pressure medication, unsure.  "Burns my skin"  . Drug Ingredient [Benzonatate] Other (See Comments) and Cough    Makes cough worse  . Latex Rash    Review of Systems  Respiratory: Positive for shortness of breath.   Cardiovascular: Positive for leg swelling.  Endocrine: Positive  for polydipsia, polyphagia and polyuria.  All other systems reviewed and are negative.      Objective:    Physical Exam General: Vital signs reviewed.  Patient is well-developed and well-nourished, in no acute distress and cooperative with exam.  Head: Normocephalic and atraumatic. Eyes: EOMI, conjunctivae normal, no scleral icterus.  Neck: Supple, trachea midline, normal ROM, no JVD, masses, thyromegaly, or carotid bruit present.  Cardiovascular: RRR, S1 normal, S2 normal, no murmurs, gallops, or rubs. Pulmonary/Chest: Clear to auscultation bilaterally, no wheezes, rales, or rhonchi.  Abdominal: Soft, non-tender, non-distended, BS +, no masses, organomegaly, or guarding present.  Musculoskeletal: No joint deformities, erythema, or stiffness, ROM full and nontender. Extremities: No lower extremity edema bilaterally,  pulses symmetric and intact bilaterally. No cyanosis or clubbing. Neurological: A&O x3, Strength is normal and symmetric bilaterally, cranial nerve II-XII are grossly intact, no focal motor deficit, sensory intact to light touch bilaterally.  Skin: Warm, dry and intact. No rashes or erythema. Psychiatric: Normal mood and affect. speech and behavior is normal. Cognition and memory are normal.  BP 128/81 (BP Location: Right Arm, Patient Position: Sitting, Cuff Size: Normal)   Pulse 89   Temp (!) 97.5 F (36.4 C) (Temporal)   Ht 4\' 11"  (1.499 m)   Wt 158 lb 6.4 oz (71.8 kg)   LMP 12/01/2019 (Approximate)   SpO2 100%   BMI 31.99 kg/m  Wt Readings from Last 3 Encounters:  12/05/19 158 lb 6.4 oz (71.8 kg)  10/03/19 159 lb 9.6 oz (72.4 kg)  08/04/19 158 lb 11.7 oz (72 kg)    Health Maintenance Due  Topic Date Due  . PNEUMOCOCCAL POLYSACCHARIDE VACCINE AGE 39-64 HIGH RISK  Never done  . OPHTHALMOLOGY EXAM  Never done  . COVID-19 Vaccine (1) Never done  . URINE MICROALBUMIN  01/17/2019    There are no preventive care reminders to display for this patient.   Lab  Results  Component Value Date   TSH 2.180 11/14/2016   Lab Results  Component Value Date   WBC 6.1 09/24/2019   HGB 9.3 (L) 09/24/2019   HCT 31.9 (L) 09/24/2019   MCV 75.4 (L) 09/24/2019   PLT 280 09/24/2019   Lab Results  Component Value Date   NA 135 09/24/2019   K 3.6 09/24/2019   CO2 22 09/24/2019   GLUCOSE 147 (H) 09/24/2019   BUN 11 09/24/2019   CREATININE 0.91 09/24/2019   BILITOT 1.1 09/24/2019   ALKPHOS 49 09/24/2019   AST 30 09/24/2019   ALT 25 09/24/2019   PROT 6.9 09/24/2019   ALBUMIN 3.9 09/24/2019   CALCIUM 8.9 09/24/2019   ANIONGAP 11 09/24/2019   GFR 105.36 10/27/2014   Lab Results  Component Value Date   CHOL 160 08/04/2019   Lab Results  Component Value Date   HDL 43 08/04/2019   Lab Results  Component Value Date   LDLCALC 84 08/04/2019   Lab Results  Component Value Date   TRIG 163 (H) 08/04/2019   Lab Results  Component Value Date   CHOLHDL 3.7 08/04/2019   Lab Results  Component Value Date   HGBA1C 5.9 (H) 08/04/2019       Assessment & Plan:  Gretta was seen today for foot swelling and migraine.  Diagnoses and all orders for this visit:  Generalized edema Per patient and photo feet were edematous - compression hose , elevate feet and monitor sodium intake. Photos to down load   Essential hypertension Blood pressure is controlled less than 130/80, low-sodium, DASH diet, medication compliance, 150 minutes of moderate intensity exercise per week. Discussed medication compliance, amlodipine 5mg  daily Prediabetes 5.9 will add low dose of ACE or ARB on follow up visit    No orders of the defined types were placed in this encounter.    Kerin Perna, NP

## 2019-12-05 NOTE — Patient Instructions (Signed)
Edema  Edema is when you have too much fluid in your body or under your skin. Edema may make your legs, feet, and ankles swell up. Swelling is also common in looser tissues, like around your eyes. This is a common condition. It gets more common as you get older. There are many possible causes of edema. Eating too much salt (sodium) and being on your feet or sitting for a long time can cause edema in your legs, feet, and ankles. Hot weather may make edema worse. Edema is usually painless. Your skin may look swollen or shiny. Follow these instructions at home:  Keep the swollen body part raised (elevated) above the level of your heart when you are sitting or lying down.  Do not sit still or stand for a long time.  Do not wear tight clothes. Do not wear garters on your upper legs.  Exercise your legs. This can help the swelling go down.  Wear elastic bandages or support stockings as told by your doctor.  Eat a low-salt (low-sodium) diet to reduce fluid as told by your doctor.  Depending on the cause of your swelling, you may need to limit how much fluid you drink (fluid restriction).  Take over-the-counter and prescription medicines only as told by your doctor. Contact a doctor if:  Treatment is not working.  You have heart, liver, or kidney disease and have symptoms of edema.  You have sudden and unexplained weight gain. Get help right away if:  You have shortness of breath or chest pain.  You cannot breathe when you lie down.  You have pain, redness, or warmth in the swollen areas.  You have heart, liver, or kidney disease and get edema all of a sudden.  You have a fever and your symptoms get worse all of a sudden. Summary  Edema is when you have too much fluid in your body or under your skin.  Edema may make your legs, feet, and ankles swell up. Swelling is also common in looser tissues, like around your eyes.  Raise (elevate) the swollen body part above the level of your  heart when you are sitting or lying down.  Follow your doctor's instructions about diet and how much fluid you can drink (fluid restriction). This information is not intended to replace advice given to you by your health care provider. Make sure you discuss any questions you have with your health care provider. Document Revised: 06/16/2017 Document Reviewed: 07/01/2016 Elsevier Patient Education  2020 [employer]  

## 2019-12-25 ENCOUNTER — Ambulatory Visit (INDEPENDENT_AMBULATORY_CARE_PROVIDER_SITE_OTHER): Payer: Medicaid Other | Admitting: Primary Care

## 2019-12-25 ENCOUNTER — Other Ambulatory Visit: Payer: Self-pay

## 2019-12-25 ENCOUNTER — Encounter (INDEPENDENT_AMBULATORY_CARE_PROVIDER_SITE_OTHER): Payer: Self-pay | Admitting: Primary Care

## 2019-12-25 VITALS — BP 111/73 | HR 90 | Temp 98.3°F | Ht 59.0 in | Wt 157.4 lb

## 2019-12-25 DIAGNOSIS — L309 Dermatitis, unspecified: Secondary | ICD-10-CM | POA: Diagnosis not present

## 2019-12-25 DIAGNOSIS — Z789 Other specified health status: Secondary | ICD-10-CM | POA: Diagnosis not present

## 2019-12-25 MED ORDER — TRIAMCINOLONE ACETONIDE 0.5 % EX OINT: 1.0000 "application " | TOPICAL_OINTMENT | Freq: Two times a day (BID) | CUTANEOUS | 2 refills | Status: DC

## 2019-12-25 NOTE — Progress Notes (Signed)
Acute Office Visit  Subjective:    [name], female    DOB: September 17, 1970, 49 y.o.   MRN: [id number]  Chief Complaint  Patient presents with  . Weight Loss    HPI Ms. Myha Arizpe is a 49 year old female with concerns of not loosing weight . She exercises twice daily 1 1/2 miles each time. Changed her diet to exclude pork, red meat she is only consuming chicken and fish. Appetite is not there she can go days without eating.   Past Medical History:  Diagnosis Date  . Asthma   . Breast discharge 01/10/2017  . Congenital ventricular septal defect   . Diabetes mellitus without complication ([place])   . Heart murmur   . Hypertension   . Obesity   . Pulmonary hypertension (HCC)    mild   . Tricuspid regurgitation   . VSD (ventricular septal defect), perimembranous    Qp:Qs 1.7:1 by catheterization     Past Surgical History:  Procedure Laterality Date  . CARDIAC CATHETERIZATION [name] 10/30/2014   Procedure: Right Heart Cath;  Surgeon: [name], MD;  Location: [facility] INVASIVE CV LAB CUPID;  Service: Cardiovascular;  Laterality: [name];  . CHOLECYSTECTOMY  2018  . CYST EXCISION    . LEFT AND RIGHT HEART CATHETERIZATION WITH CORONARY ANGIOGRAM [name] 09/11/2013   Procedure: LEFT AND RIGHT HEART CATHETERIZATION WITH CORONARY ANGIOGRAM;  Surgeon: [name], MD;  Location: [facility] CATH LAB;  Service: Cardiovascular;  Laterality: [name];  . RIGHT HEART CATH [name] 09/02/2016   Procedure: Right Heart Cath;  Surgeon: [name], MD;  Location: [place] CV LAB;  Service: Cardiovascular;  Laterality: [name];  . TUBAL LIGATION      Family History  Problem Relation Age of Onset  . Cancer Other   . Hyperlipidemia Other   . Hypertension Other   . Asthma Other   . Diabetes Mother   . Diabetes Father     Social History   Socioeconomic History  . Marital status: Single    Spouse name: Not on file  . Number of children: Not on file  . Years of education: Not on file  . Highest  education level: Not on file  Occupational History  . Not on file  Tobacco Use  . Smoking status: Never Smoker  . Smokeless tobacco: Never Used  Vaping Use  . Vaping Use: Never used  Substance and Sexual Activity  . Alcohol use: No  . Drug use: No  . Sexual activity: Yes    Birth control/protection: Surgical  Other Topics Concern  . Not on file  Social History Narrative   Lives with boyfriend. On disability.    Social Determinants of Health   Financial Resource Strain:   . Difficulty of Paying Living Expenses:   Food Insecurity:   . Worried About Charity fundraiser in the Last Year:   . Arboriculturist in the Last Year:   Transportation Needs:   . Film/video editor (Medical):   Marland Kitchen Lack of Transportation (Non-Medical):   Physical Activity:   . Days of Exercise per Week:   . Minutes of Exercise per Session:   Stress:   . Feeling of Stress :   Social Connections:   . Frequency of Communication with Friends and Family:   . Frequency of Social Gatherings with Friends and Family:   . Attends Religious Services:   . Active Member of Clubs or Organizations:   . Attends  Club or Organization Meetings:   Marland Kitchen Marital Status:   Intimate Partner Violence:   . Fear of Current or Ex-Partner:   . Emotionally Abused:   Marland Kitchen Physically Abused:   . Sexually Abused:     Outpatient Medications Prior to Visit  Medication Sig Dispense Refill  . Accu-Chek FastClix Lancets MISC USE 2 TIMES DAILY AS DIRECTED 100 each 11  . albuterol (VENTOLIN HFA) 108 (90 Base) MCG/ACT inhaler Inhale 2 puffs into the lungs every 6 (six) hours as needed for wheezing or shortness of breath. 6.7 g 1  . amLODipine (NORVASC) 5 MG tablet Take 1 tablet (5 mg total) by mouth daily. 90 tablet 1  . ammonium lactate (LAC-HYDRIN) 12 % lotion Apply 1 application topically as needed for dry skin. 567 g 1  . budesonide-formoterol (SYMBICORT) 160-4.5 MCG/ACT inhaler Inhale 2 puffs into the lungs 2 (two) times daily. 1  Inhaler 5  . cetirizine (ZYRTEC) 10 MG tablet Take 1 tablet (10 mg total) by mouth daily. At bedtime as needed 30 tablet 11  . clopidogrel (PLAVIX) 75 MG tablet Take 1 tablet (75 mg total) by mouth daily. 28 tablet 0  . glucose blood ([employer] GLUCOSE METER TEST STRIPS) test strip Use as instructed 100 each 12  . JANUMET 50-1000 MG tablet Take 1 tablet by mouth 1 day or 1 dose. Take 1 tablet daily after breakfast 90 tablet 1  . rosuvastatin (CRESTOR) 20 MG tablet Take 20 mg by mouth at bedtime.    Marland Kitchen Spacer/Aero-Holding Chambers DEVI 1 Container by Does not apply route daily as needed. 1 Device 0  . topiramate (TOPAMAX) 25 MG tablet Take 75 mg by mouth at bedtime.     . triamcinolone cream (KENALOG) 0.1 % Apply 1 application topically 2 (two) times daily. 453 g 0   No facility-administered medications prior to visit.    Allergies  Allergen Reactions  . Penicillins Anaphylaxis    Has patient had a PCN reaction causing immediate rash, facial/tongue/throat swelling, SOB or lightheadedness with hypotension: Yes Has patient had a PCN reaction causing severe rash involving mucus membranes or skin necrosis: Yes Has patient had a PCN reaction that required hospitalization Yes- went to ED, was not admitted Has patient had a PCN reaction occurring within the last 10 years: No- longer then 10 years If all of the above answers are "NO", then may proceed with Cephalosporin use.   . Tape Rash    Other reaction(s): Other (See Comments) Burns skin  . Other Rash    Other reaction(s): Other (See Comments) **EKG GEL PADS**  "Burns my skin" Pt states she is allergic to a blood pressure medication, unsure.  **EKG GEL PADS**  "Burns my skin"  . Drug Ingredient [Benzonatate] Other (See Comments) and Cough    Makes cough worse  . Latex Rash    Review of Systems  Constitutional: Positive for activity change and appetite change.       Decreased Increased activity   All other systems reviewed and are  negative.      Objective:    Physical Exam Vitals reviewed.  Constitutional:      Appearance: She is obese.  HENT:     Head: Normocephalic.  Eyes:     Extraocular Movements: Extraocular movements intact.  Cardiovascular:     Heart sounds: Murmur heard.   Pulmonary:     Effort: Pulmonary effort is normal.     Breath sounds: Normal breath sounds.  Musculoskeletal:  General: Normal range of motion.     Cervical back: Normal range of motion.  Neurological:     Mental Status: She is alert and oriented to person, place, and time.  Psychiatric:        Mood and Affect: Mood normal.        Behavior: Behavior normal.        Thought Content: Thought content normal.     BP 111/73 (BP Location: Right Arm, Patient Position: Sitting, Cuff Size: Normal)   Pulse 90   Temp 98.3 F (36.8 C) (Oral)   Ht 4\' 11"  (1.499 m)   Wt 157 lb 6.4 oz (71.4 kg)   LMP 12/01/2019 (Approximate)   SpO2 99%   BMI 31.79 kg/m  Wt Readings from Last 3 Encounters:  12/25/19 157 lb 6.4 oz (71.4 kg)  12/05/19 158 lb 6.4 oz (71.8 kg)  10/03/19 159 lb 9.6 oz (72.4 kg)    Health Maintenance Due  Topic Date Due  . PNEUMOCOCCAL POLYSACCHARIDE VACCINE AGE 55-64 HIGH RISK  Never done  . OPHTHALMOLOGY EXAM  Never done  . COVID-19 Vaccine (1) Never done  . URINE MICROALBUMIN  01/17/2019    There are no preventive care reminders to display for this patient.   Lab Results  Component Value Date   TSH 2.180 11/14/2016   Lab Results  Component Value Date   WBC 6.1 09/24/2019   HGB 9.3 (L) 09/24/2019   HCT 31.9 (L) 09/24/2019   MCV 75.4 (L) 09/24/2019   PLT 280 09/24/2019   Lab Results  Component Value Date   NA 135 09/24/2019   K 3.6 09/24/2019   CO2 22 09/24/2019   GLUCOSE 147 (H) 09/24/2019   BUN 11 09/24/2019   CREATININE 0.91 09/24/2019   BILITOT 1.1 09/24/2019   ALKPHOS 49 09/24/2019   AST 30 09/24/2019   ALT 25 09/24/2019   PROT 6.9 09/24/2019   ALBUMIN 3.9 09/24/2019   CALCIUM  8.9 09/24/2019   ANIONGAP 11 09/24/2019   GFR 105.36 10/27/2014   Lab Results  Component Value Date   CHOL 160 08/04/2019   Lab Results  Component Value Date   HDL 43 08/04/2019   Lab Results  Component Value Date   LDLCALC 84 08/04/2019   Lab Results  Component Value Date   TRIG 163 (H) 08/04/2019   Lab Results  Component Value Date   CHOLHDL 3.7 08/04/2019   Lab Results  Component Value Date   HGBA1C 5.9 (H) 08/04/2019       Assessment & Plan:  Dahlila was seen today for weight loss.  Diagnoses and all orders for this visit:  Educated about management of weight Filed Weights   12/25/19 0836  Weight: 157 lb 6.4 oz (71.4 kg)  Discussed diet and exercise for person with BMI >25. Instructed: You must burn more calories than you eat. Losing 5 percent of your body weight should be considered a success. In the longer term, losing more than 15 percent of your body weight and staying at this weight is an extremely good result. However, keep in mind that even losing 5 percent of your body weight leads to important health benefits, so try not to get discouraged if you're not able to lose more than this. Will recheck weight in 3-6 months.   No orders of the defined types were placed in this encounter.    Kerin Perna, NP

## 2019-12-26 ENCOUNTER — Ambulatory Visit (INDEPENDENT_AMBULATORY_CARE_PROVIDER_SITE_OTHER): Payer: Medicaid Other | Admitting: Primary Care

## 2020-01-10 DIAGNOSIS — Q210 Ventricular septal defect: Secondary | ICD-10-CM | POA: Diagnosis not present

## 2020-01-10 DIAGNOSIS — I10 Essential (primary) hypertension: Secondary | ICD-10-CM | POA: Diagnosis not present

## 2020-01-22 DIAGNOSIS — R0989 Other specified symptoms and signs involving the circulatory and respiratory systems: Secondary | ICD-10-CM | POA: Diagnosis not present

## 2020-01-22 DIAGNOSIS — R0789 Other chest pain: Secondary | ICD-10-CM | POA: Diagnosis not present

## 2020-01-22 DIAGNOSIS — D509 Iron deficiency anemia, unspecified: Secondary | ICD-10-CM | POA: Diagnosis not present

## 2020-01-27 ENCOUNTER — Encounter (INDEPENDENT_AMBULATORY_CARE_PROVIDER_SITE_OTHER): Payer: Self-pay | Admitting: Primary Care

## 2020-01-27 ENCOUNTER — Ambulatory Visit (INDEPENDENT_AMBULATORY_CARE_PROVIDER_SITE_OTHER): Payer: Medicaid Other | Admitting: Primary Care

## 2020-01-27 ENCOUNTER — Other Ambulatory Visit: Payer: Self-pay

## 2020-01-27 VITALS — BP 118/79 | HR 91 | Temp 97.6°F | Ht 59.0 in | Wt 128.2 lb

## 2020-01-27 DIAGNOSIS — J4520 Mild intermittent asthma, uncomplicated: Secondary | ICD-10-CM | POA: Diagnosis not present

## 2020-01-27 DIAGNOSIS — J309 Allergic rhinitis, unspecified: Secondary | ICD-10-CM | POA: Diagnosis not present

## 2020-01-27 DIAGNOSIS — I10 Essential (primary) hypertension: Secondary | ICD-10-CM | POA: Diagnosis not present

## 2020-01-27 DIAGNOSIS — D509 Iron deficiency anemia, unspecified: Secondary | ICD-10-CM

## 2020-01-27 DIAGNOSIS — L309 Dermatitis, unspecified: Secondary | ICD-10-CM | POA: Diagnosis not present

## 2020-01-27 DIAGNOSIS — E119 Type 2 diabetes mellitus without complications: Secondary | ICD-10-CM

## 2020-01-27 MED ORDER — ROSUVASTATIN CALCIUM 20 MG PO TABS: 20.0000 mg | ORAL_TABLET | Freq: Every evening | ORAL | 11 refills | Status: DC

## 2020-01-27 MED ORDER — IBUPROFEN 800 MG PO TABS: 800.0000 mg | ORAL_TABLET | Freq: Three times a day (TID) | ORAL | 1 refills | Status: DC | PRN

## 2020-01-27 MED ORDER — JANUMET 50-1000 MG PO TABS: 1.0000 | ORAL_TABLET | ORAL | 1 refills | Status: DC

## 2020-01-27 MED ORDER — CETIRIZINE HCL 10 MG PO TABS: 10.0000 mg | ORAL_TABLET | Freq: Every day | ORAL | 11 refills | Status: DC

## 2020-01-27 MED ORDER — AMLODIPINE BESYLATE 5 MG PO TABS: 5.0000 mg | ORAL_TABLET | Freq: Every day | ORAL | 1 refills | Status: DC

## 2020-01-27 MED ORDER — ALBUTEROL SULFATE HFA 108 (90 BASE) MCG/ACT IN AERS: 2.0000 | INHALATION_SPRAY | Freq: Four times a day (QID) | RESPIRATORY_TRACT | 1 refills | Status: AC | PRN

## 2020-01-27 MED ORDER — BUDESONIDE-FORMOTEROL FUMARATE 160-4.5 MCG/ACT IN AERO: 2.0000 | INHALATION_SPRAY | Freq: Two times a day (BID) | RESPIRATORY_TRACT | 5 refills | Status: DC

## 2020-01-27 MED ORDER — TRIAMCINOLONE ACETONIDE 0.5 % EX OINT: 1.0000 "application " | TOPICAL_OINTMENT | Freq: Two times a day (BID) | CUTANEOUS | 2 refills | Status: DC

## 2020-01-27 MED ORDER — TOPIRAMATE 25 MG PO TABS: 75.0000 mg | ORAL_TABLET | ORAL | 1 refills | Status: DC | PRN

## 2020-01-27 MED ORDER — FERROUS SULFATE 325 (65 FE) MG PO TABS: 325.0000 mg | ORAL_TABLET | Freq: Every day | ORAL | 3 refills | Status: DC

## 2020-01-27 NOTE — Patient Instructions (Signed)
Diphenhydramine oral syrup or elixir What is this medicine? DIPHENHYDRAMINE (dye fen HYE dra meen) is an histamine blocker. It is used to treat the symptoms of an allergic reaction. This medicine may be used for other purposes; ask your health care provider or pharmacist if you have questions. COMMON BRAND NAME(S): Altaryl, Banophen, Benadryl, Benadryl Allergy, Benadryl Allergy Children's, Benadryl Children's Allergy, Benadryl Children's Perfect Measure, DIPHEN, Diphen AF, Diphenhist, ElixSure Allergy, Genahist, Geri-Dryl, Hydramine, M-Dryl, PediaCare Children's Allergy, PediaCare Nighttime Cough, PediaClear Children's Cough, Q-Dryl, Chana Bode, Siladryl Allergy, Silphen, Kristie Cowman PD What should I tell my health care provider before I take this medicine? They need to know if you have any of these conditions:  glaucoma  high blood pressure or heart disease  liver disease  lung or breathing disease, like asthma  pain or trouble passing urine  prostate trouble  ulcers or other stomach problems  an unusual or allergic reaction to diphenhydramine, other medicines foods, dyes, or preservatives such as sulfites  pregnant or trying to get pregnant  breast-feeding How should I use this medicine? Take this medicine by mouth with a full glass of water. Follow the directions on the prescription label. Use a specially marked spoon or container to measure your medicine. Household spoons are not accurate. Take your medicine at regular intervals. Do not take it more often than directed. Talk to your pediatrician regarding the use of this medicine in children. Special care may be needed. Patients over 24 years old may have a stronger reaction and need a smaller dose. Overdosage: If you think you have taken too much of this medicine contact a poison control center or emergency room at once. NOTE: This medicine is only for you. Do not share this medicine with others. What if I miss a dose? If  you miss a dose, take it as soon as you can. If it is almost time for your next dose, take only that dose. Do not take double or extra doses. What may interact with this medicine? Do not take this medicine with any of the following medications:  MAOIs like Carbex, Eldepryl, Marplan, Nardil, and Parnate This medicine may also interact with the following medications:  alcohol  barbiturates like phenobarbital  medicines for bladder spasm like oxybutynin, tolterodine  medicines for blood pressure  medicines for depression, anxiety, or psychotic disturbances  medicines for movement abnormalities or Parkinson's disease  medicines for sleep  other medicines for cold, cough, or allergy  some medicines for the stomach like chlordiazepoxide, dicyclomine This list may not describe all possible interactions. Give your health care provider a list of all the medicines, herbs, non-prescription drugs, or dietary supplements you use. Also tell them if you smoke, drink alcohol, or use illegal drugs. Some items may interact with your medicine. What should I watch for while using this medicine? Visit your doctor or health care professional for regular check ups. Tell your doctor or health care professional if your symptoms do not start to get better or if they get worse. If you are diabetic use a sugar-free form of this medicine. Your mouth may get dry. Chewing sugarless gum or sucking hard candy, and drinking plenty of water may help. Contact your doctor if the problem does not go away or is severe. This medicine may cause dry eyes and blurred vision. If you wear contact lenses you may feel some discomfort. Lubricating drops may help. See your eye doctor if the problem does not go away or is severe. You may get  drowsy or dizzy. Do not drive, use machinery, or do anything that needs mental alertness until you know how this medicine affects you. Do not stand or sit up quickly, especially if you are an older  patient. This reduces the risk of dizzy or fainting spells. Alcohol may interfere with the effect of this medicine. Avoid alcoholic drinks. What side effects may I notice from receiving this medicine? Side effects that you should report to your doctor or health care professional as soon as possible:  allergic reactions like skin rash, itching or hives, swelling of the face, lips, or tongue  changes in vision  confused, agitated, or nervous  fast, irregular heartbeat  tremor  trouble passing urine or change in the amount of urine  unusual bleeding or bruising  unusually weak or tired Side effects that usually do not require medical attention (report to your doctor or health care professional if they continue or are bothersome):  constipation, diarrhea  drowsy  headache  loss of appetite  stomach upset, vomiting  thick mucus This list may not describe all possible side effects. Call your doctor for medical advice about side effects. You may report side effects to FDA at 1-800-FDA-1088. Where should I keep my medicine? Keep out of the reach of children. This medicine can be abused. Keep your medicine in a safe place. Store at room temperature, between 15 and 30 degrees C (59 and 86 degrees F). Do not freeze. Protect from light and moisture. Keep container tightly closed. Throw away any unused medicine after the expiration date. NOTE: This sheet is a summary. It may not cover all possible information. If you have questions about this medicine, talk to your doctor, pharmacist, or health care provider.  2020 Elsevier/Gold Standard (2019-03-22 12:25:03)

## 2020-01-27 NOTE — Progress Notes (Signed)
Established Patient Office Visit  Subjective:  [name], female    DOB: [birth year]  Age: 49 y.o. MRN: [id number]  CC:  Chief Complaint  Patient presents with  . Anemia  . Ear Pain    recent ear infection of left ear    HPI [name] is a 49 year old presents for chest pain which is muscle skeletal and she is concern with a dry cough . Seen in ED on 01/23/2020 for chest pain and was told her iron was to low , blood work completed will search care every where not in [facility] records.  She was seen at [facility] on 01/22/2020  Past Medical History:  Diagnosis Date  . Asthma   . Breast discharge 01/10/2017  . Congenital ventricular septal defect   . Diabetes mellitus without complication ([place])   . Heart murmur   . Hypertension   . Obesity   . Pulmonary hypertension (HCC)    mild   . Tricuspid regurgitation   . VSD (ventricular septal defect), perimembranous    Qp:Qs 1.7:1 by catheterization     Past Surgical History:  Procedure Laterality Date  . CARDIAC CATHETERIZATION [name] 10/30/2014   Procedure: Right Heart Cath;  Surgeon: [name], MD;  Location: [facility] INVASIVE CV LAB CUPID;  Service: Cardiovascular;  Laterality: [name];  . CHOLECYSTECTOMY  2018  . CYST EXCISION    . LEFT AND RIGHT HEART CATHETERIZATION WITH CORONARY ANGIOGRAM [name] 09/11/2013   Procedure: LEFT AND RIGHT HEART CATHETERIZATION WITH CORONARY ANGIOGRAM;  Surgeon: [name], MD;  Location: [facility] CATH LAB;  Service: Cardiovascular;  Laterality: [name];  . RIGHT HEART CATH [name] 09/02/2016   Procedure: Right Heart Cath;  Surgeon: [name], MD;  Location: [facility] CV LAB;  Service: Cardiovascular;  Laterality: [name];  . TUBAL LIGATION      Family History  Problem Relation Age of Onset  . Cancer Other   . Hyperlipidemia Other   . Hypertension Other   . Asthma Other   . Diabetes Mother   . Diabetes Father     Social History   Socioeconomic History  . Marital status: Single    Spouse  name: Not on file  . Number of children: Not on file  . Years of education: Not on file  . Highest education level: Not on file  Occupational History  . Not on file  Tobacco Use  . Smoking status: Never Smoker  . Smokeless tobacco: Never Used  Vaping Use  . Vaping Use: Never used  Substance and Sexual Activity  . Alcohol use: No  . Drug use: No  . Sexual activity: Yes    Birth control/protection: Surgical  Other Topics Concern  . Not on file  Social History Narrative   Lives with boyfriend. On disability.    Social Determinants of Health   Financial Resource Strain:   . Difficulty of Paying Living Expenses:   Food Insecurity:   . Worried About Charity fundraiser in the Last Year:   . Arboriculturist in the Last Year:   Transportation Needs:   . Film/video editor (Medical):   Marland Kitchen Lack of Transportation (Non-Medical):   Physical Activity:   . Days of Exercise per Week:   . Minutes of Exercise per Session:   Stress:   . Feeling of Stress :   Social Connections:   . Frequency of Communication with Friends and Family:   . Frequency of Social  Gatherings with Friends and Family:   . Attends Religious Services:   . Active Member of Clubs or Organizations:   . Attends Archivist Meetings:   Marland Kitchen Marital Status:   Intimate Partner Violence:   . Fear of Current or Ex-Partner:   . Emotionally Abused:   Marland Kitchen Physically Abused:   . Sexually Abused:     Outpatient Medications Prior to Visit  Medication Sig Dispense Refill  . Accu-Chek FastClix Lancets MISC USE 2 TIMES DAILY AS DIRECTED 100 each 11  . ammonium lactate (LAC-HYDRIN) 12 % lotion Apply 1 application topically as needed for dry skin. 567 g 1  . clopidogrel (PLAVIX) 75 MG tablet Take 1 tablet (75 mg total) by mouth daily. 28 tablet 0  . glucose blood ([employer] GLUCOSE METER TEST STRIPS) test strip Use as instructed 100 each 12  . albuterol (VENTOLIN HFA) 108 (90 Base) MCG/ACT inhaler Inhale 2 puffs into the lungs  every 6 (six) hours as needed for wheezing or shortness of breath. 6.7 g 1  . amLODipine (NORVASC) 5 MG tablet Take 1 tablet (5 mg total) by mouth daily. 90 tablet 1  . budesonide-formoterol (SYMBICORT) 160-4.5 MCG/ACT inhaler Inhale 2 puffs into the lungs 2 (two) times daily. 1 Inhaler 5  . cetirizine (ZYRTEC) 10 MG tablet Take 1 tablet (10 mg total) by mouth daily. At bedtime as needed 30 tablet 11  . JANUMET 50-1000 MG tablet Take 1 tablet by mouth 1 day or 1 dose. Take 1 tablet daily after breakfast 90 tablet 1  . rosuvastatin (CRESTOR) 20 MG tablet Take 20 mg by mouth at bedtime.    Marland Kitchen Spacer/Aero-Holding Chambers DEVI 1 Container by Does not apply route daily as needed. 1 Device 0  . topiramate (TOPAMAX) 25 MG tablet Take 75 mg by mouth as needed.    . triamcinolone ointment (KENALOG) 0.5 % Apply 1 application topically 2 (two) times daily. 30 g 2   No facility-administered medications prior to visit.    Allergies  Allergen Reactions  . Penicillins Anaphylaxis    Has patient had a PCN reaction causing immediate rash, facial/tongue/throat swelling, SOB or lightheadedness with hypotension: Yes Has patient had a PCN reaction causing severe rash involving mucus membranes or skin necrosis: Yes Has patient had a PCN reaction that required hospitalization Yes- went to ED, was not admitted Has patient had a PCN reaction occurring within the last 10 years: No- longer then 10 years If all of the above answers are "NO", then may proceed with Cephalosporin use.   . Tape Rash    Other reaction(s): Other (See Comments) Burns skin  . Other Rash    Other reaction(s): Other (See Comments) **EKG GEL PADS**  "Burns my skin" Pt states she is allergic to a blood pressure medication, unsure.  **EKG GEL PADS**  "Burns my skin"  . Drug Ingredient [Benzonatate] Other (See Comments) and Cough    Makes cough worse  . Latex Rash    ROS Review of Systems  HENT: Positive for ear pain.        Resolved  with sweet oil  Cardiovascular: Positive for chest pain.       Muscle skeletal  All other systems reviewed and are negative.     Objective:    Physical Exam Vitals reviewed.  Constitutional:      Appearance: Normal appearance.  HENT:     Head: Normocephalic.     Right Ear: Tympanic membrane normal.     Left Ear:  Tympanic membrane normal.     Nose: Nose normal.  Cardiovascular:     Rate and Rhythm: Normal rate.     Heart sounds: Murmur heard.   Pulmonary:     Effort: Pulmonary effort is normal.     Breath sounds: Normal breath sounds.  Musculoskeletal:        General: Tenderness present. Normal range of motion.     Cervical back: Normal range of motion.     Comments: Costochondritis tenderness chest area lateral underarm  Neurological:     Mental Status: She is alert.     BP 118/79 (BP Location: Left Arm, Patient Position: Sitting, Cuff Size: Normal)   Pulse 91   Temp 97.6 F (36.4 C) (Oral)   Ht 4\' 11"  (1.499 m)   Wt 128 lb 3.2 oz (58.2 kg)   LMP 01/22/2020 (Approximate)   SpO2 100%   BMI 25.89 kg/m  Wt Readings from Last 3 Encounters:  01/27/20 128 lb 3.2 oz (58.2 kg)  12/25/19 157 lb 6.4 oz (71.4 kg)  12/05/19 158 lb 6.4 oz (71.8 kg)     Health Maintenance Due  Topic Date Due  . OPHTHALMOLOGY EXAM  Never done  . COVID-19 Vaccine (1) Never done  . URINE MICROALBUMIN  01/17/2019  . INFLUENZA VACCINE  01/26/2020    There are no preventive care reminders to display for this patient.  Lab Results  Component Value Date   TSH 2.180 11/14/2016   Lab Results  Component Value Date   WBC 6.1 09/24/2019   HGB 9.3 (L) 09/24/2019   HCT 31.9 (L) 09/24/2019   MCV 75.4 (L) 09/24/2019   PLT 280 09/24/2019   Lab Results  Component Value Date   NA 135 09/24/2019   K 3.6 09/24/2019   CO2 22 09/24/2019   GLUCOSE 147 (H) 09/24/2019   BUN 11 09/24/2019   CREATININE 0.91 09/24/2019   BILITOT 1.1 09/24/2019   ALKPHOS 49 09/24/2019   AST 30 09/24/2019   ALT  25 09/24/2019   PROT 6.9 09/24/2019   ALBUMIN 3.9 09/24/2019   CALCIUM 8.9 09/24/2019   ANIONGAP 11 09/24/2019   GFR 105.36 10/27/2014   Lab Results  Component Value Date   CHOL 160 08/04/2019   Lab Results  Component Value Date   HDL 43 08/04/2019   Lab Results  Component Value Date   LDLCALC 84 08/04/2019   Lab Results  Component Value Date   TRIG 163 (H) 08/04/2019   Lab Results  Component Value Date   CHOLHDL 3.7 08/04/2019   Lab Results  Component Value Date   HGBA1C 5.9 (H) 08/04/2019      Assessment & Plan:  Mackenzie Patterson was seen today for anemia and ear pain.  Diagnoses and all orders for this visit:  Type 2 diabetes mellitus without complication, without long-term current use of insulin (HCC) Last A1c August 04, 2019 5.9 well-controlled per ADA guidelines of 6.4 A1c currently on Janumet 50/1000 daily continue to monitor carbohydrates and exercise 30 minutes daily or 150 weekly -     Microalbumin, urine  Essential hypertension Blood pressure well controlled on amlodipine 5 mg a day blood pressure is 118/79. Blood pressure is at goal of less than 130/80, low-sodium, DASH diet, medication compliance, 150 minutes of moderate intensity exercise per week.  -     amLODipine (NORVASC) 5 MG tablet; Take 1 tablet (5 mg total) by mouth daily.  Mild intermittent asthma without complication -     albuterol (VENTOLIN  HFA) 108 (90 Base) MCG/ACT inhaler; Inhale 2 puffs into the lungs every 6 (six) hours as needed for wheezing or shortness of breath. -     budesonide-formoterol (SYMBICORT) 160-4.5 MCG/ACT inhaler; Inhale 2 puffs into the lungs 2 (two) times daily.  Allergic rhinitis, unspecified seasonality, unspecified trigger -     cetirizine (ZYRTEC) 10 MG tablet; Take 1 tablet (10 mg total) by mouth daily. At bedtime as needed  Eczema, unspecified type -     triamcinolone ointment (KENALOG) 0.5 %; Apply 1 application topically 2 (two) times daily.  Iron deficiency  anemia, unspecified iron deficiency anemia type Result Value  WBC 5.3  RBC 4.13  HGB 8.2 (*)  HCT 28.5 (*)  MCV 69 (*)  MCH 19.9 (*)  MCHC 28.8 (*)  Plt Ct 212  RDW SD 45.9  MPV 8.4 (*)  Screen results from care everywhere Prescribe iron supplement and advised to drink with orange juice for better absorption.  Other orders -     topiramate (TOPAMAX) 25 MG tablet; Take 3 tablets (75 mg total) by mouth as needed. -     rosuvastatin (CRESTOR) 20 MG tablet; Take 1 tablet (20 mg total) by mouth at bedtime. -     JANUMET 50-1000 MG tablet; Take 1 tablet by mouth 1 day or 1 dose. Take 1 tablet daily after breakfast -     ibuprofen (ADVIL) 800 MG tablet; Take 1 tablet (800 mg total) by mouth every 8 (eight) hours as needed for moderate pain.   Problem List Items Addressed This Visit    Essential hypertension (Chronic)   Relevant Medications   amLODipine (NORVASC) 5 MG tablet   rosuvastatin (CRESTOR) 20 MG tablet   Diabetes mellitus (HCC) - Primary (Chronic)   Relevant Medications   rosuvastatin (CRESTOR) 20 MG tablet   JANUMET 50-1000 MG tablet   Other Relevant Orders   Microalbumin, urine   Asthma (Chronic)   Relevant Medications   albuterol (VENTOLIN HFA) 108 (90 Base) MCG/ACT inhaler   budesonide-formoterol (SYMBICORT) 160-4.5 MCG/ACT inhaler    Other Visit Diagnoses    Allergic rhinitis, unspecified seasonality, unspecified trigger       Relevant Medications   cetirizine (ZYRTEC) 10 MG tablet   Eczema, unspecified type       Relevant Medications   triamcinolone ointment (KENALOG) 0.5 %   Iron deficiency anemia, unspecified iron deficiency anemia type          Meds ordered this encounter  Medications  . topiramate (TOPAMAX) 25 MG tablet    Sig: Take 3 tablets (75 mg total) by mouth as needed.    Dispense:  30 tablet    Refill:  1  . amLODipine (NORVASC) 5 MG tablet    Sig: Take 1 tablet (5 mg total) by mouth daily.    Dispense:  90 tablet    Refill:  1  .  rosuvastatin (CRESTOR) 20 MG tablet    Sig: Take 1 tablet (20 mg total) by mouth at bedtime.    Dispense:  30 tablet    Refill:  11  . albuterol (VENTOLIN HFA) 108 (90 Base) MCG/ACT inhaler    Sig: Inhale 2 puffs into the lungs every 6 (six) hours as needed for wheezing or shortness of breath.    Dispense:  6.7 g    Refill:  1  . budesonide-formoterol (SYMBICORT) 160-4.5 MCG/ACT inhaler    Sig: Inhale 2 puffs into the lungs 2 (two) times daily.    Dispense:  1  Inhaler    Refill:  5  . cetirizine (ZYRTEC) 10 MG tablet    Sig: Take 1 tablet (10 mg total) by mouth daily. At bedtime as needed    Dispense:  30 tablet    Refill:  11  . JANUMET 50-1000 MG tablet    Sig: Take 1 tablet by mouth 1 day or 1 dose. Take 1 tablet daily after breakfast    Dispense:  90 tablet    Refill:  1  . ibuprofen (ADVIL) 800 MG tablet    Sig: Take 1 tablet (800 mg total) by mouth every 8 (eight) hours as needed for moderate pain.    Dispense:  90 tablet    Refill:  1  . triamcinolone ointment (KENALOG) 0.5 %    Sig: Apply 1 application topically 2 (two) times daily.    Dispense:  30 g    Refill:  2    Follow-up: Return in about 6 months (around 07/29/2020) for inperson DM.    Kerin Perna, NP

## 2020-01-27 NOTE — Progress Notes (Signed)
Complains of dry cough only at night

## 2020-01-28 LAB — MICROALBUMIN, URINE: Microalbumin, Urine: <3 ug/mL

## 2020-02-03 ENCOUNTER — Ambulatory Visit (INDEPENDENT_AMBULATORY_CARE_PROVIDER_SITE_OTHER): Payer: Medicaid Other | Admitting: Primary Care

## 2020-02-12 DIAGNOSIS — G43009 Migraine without aura, not intractable, without status migrainosus: Secondary | ICD-10-CM | POA: Diagnosis not present

## 2020-02-12 DIAGNOSIS — Z8673 Personal history of transient ischemic attack (TIA), and cerebral infarction without residual deficits: Secondary | ICD-10-CM | POA: Diagnosis not present

## 2020-03-18 DIAGNOSIS — Q210 Ventricular septal defect: Secondary | ICD-10-CM | POA: Diagnosis not present

## 2020-03-27 ENCOUNTER — Encounter (INDEPENDENT_AMBULATORY_CARE_PROVIDER_SITE_OTHER): Payer: Self-pay | Admitting: Primary Care

## 2020-03-27 ENCOUNTER — Ambulatory Visit (INDEPENDENT_AMBULATORY_CARE_PROVIDER_SITE_OTHER): Payer: Medicaid Other | Admitting: Primary Care

## 2020-03-27 ENCOUNTER — Other Ambulatory Visit: Payer: Self-pay | Admitting: Primary Care

## 2020-03-27 ENCOUNTER — Other Ambulatory Visit: Payer: Self-pay

## 2020-03-27 VITALS — BP 118/83 | HR 91 | Temp 97.5°F | Ht 59.0 in | Wt 158.0 lb

## 2020-03-27 DIAGNOSIS — E119 Type 2 diabetes mellitus without complications: Secondary | ICD-10-CM | POA: Diagnosis not present

## 2020-03-27 DIAGNOSIS — I10 Essential (primary) hypertension: Secondary | ICD-10-CM

## 2020-03-27 DIAGNOSIS — E785 Hyperlipidemia, unspecified: Secondary | ICD-10-CM | POA: Diagnosis not present

## 2020-03-27 DIAGNOSIS — J309 Allergic rhinitis, unspecified: Secondary | ICD-10-CM

## 2020-03-27 DIAGNOSIS — Z1231 Encounter for screening mammogram for malignant neoplasm of breast: Secondary | ICD-10-CM

## 2020-03-27 LAB — POCT GLYCOSYLATED HEMOGLOBIN (HGB A1C): Hemoglobin A1C: 5.3 % (ref 4.0–5.6)

## 2020-03-27 MED ORDER — JANUMET 50-1000 MG PO TABS: 1.0000 | ORAL_TABLET | ORAL | 1 refills | Status: DC

## 2020-03-27 MED ORDER — PREDNISONE 10 MG PO TABS: 10.0000 mg | ORAL_TABLET | Freq: Every day | ORAL | 0 refills | Status: DC

## 2020-03-27 MED ORDER — FLUTICASONE PROPIONATE 50 MCG/ACT NA SUSP: 2.0000 | Freq: Every day | NASAL | 6 refills | Status: DC

## 2020-03-27 NOTE — Patient Instructions (Signed)
Allergies, Adult °An allergy is when your body's defense system (immune system) overreacts to an otherwise harmless substance (allergen) that you breathe in or eat or something that touches your skin. When you come into contact with something that you are allergic to, your immune system produces certain proteins (antibodies). These proteins cause cells to release chemicals (histamines) that trigger the symptoms of an allergic reaction. °Allergies often affect the nasal passages (allergic rhinitis), eyes (allergic conjunctivitis), skin (atopic dermatitis), and stomach. Allergies can be mild or severe. Allergies cannot spread from person to person (are not contagious). They can develop at any age and may be outgrown. °What increases the risk? °You may be at greater risk of allergies if other people in your family have allergies. °What are the signs or symptoms? °Symptoms depend on what type of allergy you have. They may include: °· Runny, stuffy nose. °· Sneezing. °· Itchy mouth, ears, or throat. °· Postnasal drip. °· Sore throat. °· Itchy, red, watery, or puffy eyes. °· Skin rash or hives. °· Stomach pain. °· Vomiting. °· Diarrhea. °· Bloating. °· Wheezing or coughing. °People with a severe allergy to food, medicine, or an insect bite may have a life-threatening allergic reaction (anaphylaxis). Symptoms of anaphylaxis include: °· Hives. °· Itching. °· Flushed face. °· Swollen lips, tongue, or mouth. °· Tight or swollen throat. °· Chest pain or tightness in the chest. °· Trouble breathing or shortness of breath. °· Rapid heartbeat. °· Dizziness or fainting. °· Vomiting. °· Diarrhea. °· Pain in the abdomen. °How is this diagnosed? °This condition is diagnosed based on: °· Your symptoms. °· Your family and medical history. °· A physical exam. °You may need to see a health care provider who specializes in treating allergies (allergist). You may also have tests, including: °· Skin tests to see which allergens are causing  your symptoms, such as: °? Skin prick test. In this test, your skin is pricked with a tiny needle and exposed to small amounts of possible allergens to see if your skin reacts. °? Intradermal skin test. In this test, a small amount of allergen is injected under your skin to see if your skin reacts. °? Patch test. In this test, a small amount of allergen is placed on your skin and then your skin is covered with a bandage. Your health care provider will check your skin after a couple of days to see if a rash has developed. °· Blood tests. °· Challenges tests. In this test, you inhale a small amount of allergen by mouth to see if you have an allergic reaction. °You may also be asked to: °· Keep a food diary. A food diary is a record of all the foods and drinks you have in a day and any symptoms you experience. °· Practice an elimination diet. An elimination diet involves eliminating specific foods from your diet and then adding them back in one by one to find out if a certain food causes an allergic reaction. °How is this treated? °Treatment for allergies depends on your symptoms. Treatment may include: °· Cold compresses to soothe itching and swelling. °· Eye drops. °· Nasal sprays. °· Using a saline spray or container (neti pot) to flush out the nose (nasal irrigation). These methods can help clear away mucus and keep the nasal passages moist. °· Using a humidifier. °· Oral antihistamines or other medicines to block allergic reaction and inflammation. °· Skin creams to treat rashes or itching. °· Diet changes to eliminate food allergy triggers. °·   Repeated exposure to tiny amounts of allergens to build up a tolerance and prevent future allergic reactions (immunotherapy). These include: °? Allergy shots. °? Oral treatment. This involves taking small doses of an allergen under the tongue (sublingual immunotherapy). °· Emergency epinephrine injection (auto-injector) in case of an allergic emergency. This is a  self-injectable, pre-measured medicine that must be given within the first few minutes of a serious allergic reaction. °Follow these instructions at home: ° °  ° °  ° °· Avoid known allergens whenever possible. °· If you suffer from airborne allergens, wash out your nose daily. You can do this with a saline spray or a neti pot to flush out your nose (nasal irrigation). °· Take over-the-counter and prescription medicines only as told by your health care provider. °· Keep all follow-up visits as told by your health care provider. This is important. °· If you are at risk of a severe allergic reaction (anaphylaxis), keep your auto-injector with you at all times. °· If you have ever had anaphylaxis, wear a medical alert bracelet or necklace that states you have a severe allergy. °Contact a health care provider if: °· Your symptoms do not improve with treatment. °Get help right away if: °· You have symptoms of anaphylaxis, such as: °? Swollen mouth, tongue, or throat. °? Pain or tightness in your chest. °? Trouble breathing or shortness of breath. °? Dizziness or fainting. °? Severe abdominal pain, vomiting, or diarrhea. °This information is not intended to replace advice given to you by your health care provider. Make sure you discuss any questions you have with your health care provider. °Document Revised: 09/06/2017 Document Reviewed: 12/30/2015 °Elsevier Patient Education © 2020 [employer] ° °

## 2020-03-27 NOTE — Progress Notes (Signed)
Established Patient Office Visit  Subjective:  [name], female    DOB: [birth year]  Age: 49 y.o. MRN: [id number]  CC:  Chief Complaint  Patient presents with  . sneezing  . Allergies    HPI Jessee Newnam is a 49 year old female presents for seasonal allergies.  Past Medical History:  Diagnosis Date  . Asthma   . Breast discharge 01/10/2017  . Congenital ventricular septal defect   . Diabetes mellitus without complication ([place])   . Heart murmur   . Hypertension   . Obesity   . Pulmonary hypertension (HCC)    mild   . Tricuspid regurgitation   . VSD (ventricular septal defect), perimembranous    Qp:Qs 1.7:1 by catheterization     Past Surgical History:  Procedure Laterality Date  . CARDIAC CATHETERIZATION [name] 10/30/2014   Procedure: Right Heart Cath;  Surgeon: [name], MD;  Location: [facility] CUP[facility];  Service: Cardiovascular;  Laterality: [name];  . CHOLECYSTECTOMY  2018  . CYST EXCISION    . LEFT AND RIGHT HEART CATHETERIZATION WITH CORONARY ANGIOGRAM [name] 09/11/2013   Procedure: LEFT AND RIGHT HEART CATHETERIZATION WITH CORONARY ANGIOGRAM;  Surgeon: [name], MD;  Location: [facility] CATH LAB;  Service: Cardiovascular;  Laterality: [name];  . RIGHT HEART CATH [name] 09/02/2016   Procedure: Right Heart Cath;  Surgeon: [name], MD;  Location: [facility] CV LAB;  Service: Cardiovascular;  Laterality: [name];  . TUBAL LIGATION      Family History  Problem Relation Age of Onset  . Cancer Other   . Hyperlipidemia Other   . Hypertension Other   . Asthma Other   . Diabetes Mother   . Diabetes Father     Social History   Socioeconomic History  . Marital status: Single    Spouse name: Not on file  . Number of children: Not on file  . Years of education: Not on file  . Highest education level: Not on file  Occupational History  . Not on file  Tobacco Use  . Smoking status: Never Smoker  . Smokeless tobacco: Never Used  Vaping Use  . Vaping  Use: Never used  Substance and Sexual Activity  . Alcohol use: No  . Drug use: No  . Sexual activity: Yes    Birth control/protection: Surgical  Other Topics Concern  . Not on file  Social History Narrative   Lives with boyfriend. On disability.    Social Determinants of Health   Financial Resource Strain:   . Difficulty of Paying Living Expenses: Not on file  Food Insecurity:   . Worried About Charity fundraiser in the Last Year: Not on file  . Ran Out of Food in the Last Year: Not on file  Transportation Needs:   . Lack of Transportation (Medical): Not on file  . Lack of Transportation (Non-Medical): Not on file  Physical Activity:   . Days of Exercise per Week: Not on file  . Minutes of Exercise per Session: Not on file  Stress:   . Feeling of Stress : Not on file  Social Connections:   . Frequency of Communication with Friends and Family: Not on file  . Frequency of Social Gatherings with Friends and Family: Not on file  . Attends Religious Services: Not on file  . Active Member of Clubs or Organizations: Not on file  . Attends Archivist Meetings: Not on file  . Marital Status: Not on file  Intimate Partner Violence:   . Fear of Current or Ex-Partner: Not on file  . Emotionally Abused: Not on file  . Physically Abused: Not on file  . Sexually Abused: Not on file    Outpatient Medications Prior to Visit  Medication Sig Dispense Refill  . Accu-Chek FastClix Lancets MISC USE 2 TIMES DAILY AS DIRECTED 100 each 11  . albuterol (VENTOLIN HFA) 108 (90 Base) MCG/ACT inhaler Inhale 2 puffs into the lungs every 6 (six) hours as needed for wheezing or shortness of breath. 6.7 g 1  . amLODipine (NORVASC) 5 MG tablet Take 1 tablet (5 mg total) by mouth daily. 90 tablet 1  . ammonium lactate (LAC-HYDRIN) 12 % lotion Apply 1 application topically as needed for dry skin. 567 g 1  . budesonide-formoterol (SYMBICORT) 160-4.5 MCG/ACT inhaler Inhale 2 puffs into the lungs 2  (two) times daily. 1 Inhaler 5  . cetirizine (ZYRTEC) 10 MG tablet Take 1 tablet (10 mg total) by mouth daily. At bedtime as needed 30 tablet 11  . clopidogrel (PLAVIX) 75 MG tablet Take 1 tablet (75 mg total) by mouth daily. 28 tablet 0  . ferrous sulfate 325 (65 FE) MG tablet Take 1 tablet (325 mg total) by mouth daily with breakfast. 90 tablet 3  . glucose blood ([facility] GLUCOSE METER TEST STRIPS) test strip Use as instructed 100 each 12  . ibuprofen (ADVIL) 800 MG tablet Take 1 tablet (800 mg total) by mouth every 8 (eight) hours as needed for moderate pain. 90 tablet 1  . rosuvastatin (CRESTOR) 20 MG tablet Take 1 tablet (20 mg total) by mouth at bedtime. 30 tablet 11  . topiramate (TOPAMAX) 25 MG tablet Take 3 tablets (75 mg total) by mouth as needed. 30 tablet 1  . triamcinolone ointment (KENALOG) 0.5 % Apply 1 application topically 2 (two) times daily. 30 g 2  . JANUMET 50-1000 MG tablet Take 1 tablet by mouth 1 day or 1 dose. Take 1 tablet daily after breakfast 90 tablet 1   No facility-administered medications prior to visit.    Allergies  Allergen Reactions  . Penicillins Anaphylaxis    Has patient had a PCN reaction causing immediate rash, facial/tongue/throat swelling, SOB or lightheadedness with hypotension: Yes Has patient had a PCN reaction causing severe rash involving mucus membranes or skin necrosis: Yes Has patient had a PCN reaction that required hospitalization Yes- went to ED, was not admitted Has patient had a PCN reaction occurring within the last 10 years: No- longer then 10 years If all of the above answers are "NO", then may proceed with Cephalosporin use.   . Tape Rash    Other reaction(s): Other (See Comments) Burns skin  . Other Rash    Other reaction(s): Other (See Comments) **EKG GEL PADS**  "Burns my skin" Pt states she is allergic to a blood pressure medication, unsure.  **EKG GEL PADS**  "Burns my skin"  . Drug Ingredient [Benzonatate] Other (See  Comments) and Cough    Makes cough worse  . Latex Rash    ROS Review of Systems  HENT: Positive for rhinorrhea and sinus pressure.   Eyes: Positive for redness and itching.  Respiratory: Positive for cough.   Allergic/Immunologic: Positive for environmental allergies.      Objective:    Physical Exam Vitals reviewed.  Constitutional:      Appearance: Normal appearance.  HENT:     Head: Normocephalic.     Right Ear: Tympanic membrane normal.  Left Ear: Tympanic membrane normal.     Nose: Nose normal.  Eyes:     Extraocular Movements: Extraocular movements intact.  Cardiovascular:     Rate and Rhythm: Normal rate and regular rhythm.  Pulmonary:     Effort: Pulmonary effort is normal.     Breath sounds: Normal breath sounds.  Abdominal:     General: Bowel sounds are normal.  Musculoskeletal:        General: Normal range of motion.     Cervical back: Normal range of motion.  Skin:    General: Skin is warm and dry.  Neurological:     Mental Status: She is alert and oriented to person, place, and time.  Psychiatric:        Mood and Affect: Mood normal.        Behavior: Behavior normal.        Thought Content: Thought content normal.        Judgment: Judgment normal.     BP 118/83 (BP Location: Left Arm, Patient Position: Sitting, Cuff Size: Normal)   Pulse 91   Temp (!) 97.5 F (36.4 C) (Temporal)   Ht _0  (1.499 m)   Wt 158 lb (71.7 kg)   LMP 03/24/2020 (Exact Date)   SpO2 99%   BMI 31.91 kg/m  Wt Readings from Last 3 Encounters:  03/27/20 158 lb (71.7 kg)  01/27/20 128 lb 3.2 oz (58.2 kg)  12/25/19 157 lb 6.4 oz (71.4 kg)     Health Maintenance Due  Topic Date Due  . OPHTHALMOLOGY EXAM  Never done  . COVID-19 Vaccine (1) Never done    There are no preventive care reminders to display for this patient.  Lab Results  Component Value Date   TSH 2.180 11/14/2016   Lab Results  Component Value Date   WBC 6.1 09/24/2019   HGB 9.3 (L)  09/24/2019   HCT 31.9 (L) 09/24/2019   MCV 75.4 (L) 09/24/2019   PLT 280 09/24/2019   Lab Results  Component Value Date   NA 135 09/24/2019   K 3.6 09/24/2019   CO2 22 09/24/2019   GLUCOSE 147 (H) 09/24/2019   BUN 11 09/24/2019   CREATININE 0.91 09/24/2019   BILITOT 1.1 09/24/2019   ALKPHOS 49 09/24/2019   AST 30 09/24/2019   ALT 25 09/24/2019   PROT 6.9 09/24/2019   ALBUMIN 3.9 09/24/2019   CALCIUM 8.9 09/24/2019   ANIONGAP 11 09/24/2019   GFR 105.36 10/27/2014   Lab Results  Component Value Date   CHOL 160 08/04/2019   Lab Results  Component Value Date   HDL 43 08/04/2019   Lab Results  Component Value Date   LDLCALC 84 08/04/2019   Lab Results  Component Value Date   TRIG 163 (H) 08/04/2019   Lab Results  Component Value Date   CHOLHDL 3.7 08/04/2019   Lab Results  Component Value Date   HGBA1C 5.3 03/27/2020      Assessment & Plan:  Elica was seen today for sneezing and allergies.  Diagnoses and all orders for this visit:  Type 2 diabetes mellitus without complication, without long-term current use of insulin (HCC) Goal of therapy : Less than 6.5 hemoglobin A1c.  Reference clinical practice recommendations. Foods that are high in carbohydrates are the following rice, potatoes, breads, sugars, and pastas.  Reduction in the intake (eating) will assist in lowering your blood sugars. -     HgB A1c 5.3 -     CMP14+EGFR -  Ambulatory referral to Ophthalmology  Allergic rhinitis, unspecified seasonality, unspecified trigger Maxillary tenderness continue antihistimines . Prednisone dose pack and Flonase as needed  Essential hypertension Blood pressure unremarkable  -     CBC with Differential  Hyperlipidemia, unspecified hyperlipidemia type Discussed low fat diet  -     Lipid Panel   Meds ordered this encounter  Medications  . predniSONE (DELTASONE) 10 MG tablet    Sig: Take 1 tablet (10 mg total) by mouth daily with breakfast. Dose pack Day 1  -6 tab, day 2-5 tab day 3- 4 tabs day 3- 3 tab  Day 4 2 tabs Day 5 1 tab    Dispense:  21 tablet    Refill:  0  . JANUMET 50-1000 MG tablet    Sig: Take 1 tablet by mouth 1 day or 1 dose. Take 1 tablet daily after breakfast    Dispense:  90 tablet    Refill:  1  . fluticasone (FLONASE) 50 MCG/ACT nasal spray    Sig: Place 2 sprays into both nostrils daily.    Dispense:  16 g    Refill:  6    Follow-up: Return in about 6 months (around 09/25/2020) for DM.    Kerin Perna, NP

## 2020-03-27 NOTE — Progress Notes (Signed)
Pt states she is sneezing all the time she can not get any sleep due to nasal congestion and the medication prescribed is not helping  States she needs something for chest pain- chest pain only at night

## 2020-03-28 LAB — LIPID PANEL
Chol/HDL Ratio: 2.3 ratio (ref 0.0–4.4)
Cholesterol, Total: 109 mg/dL (ref 100–199)
HDL: 47 mg/dL (ref >39)
LDL Chol Calc (NIH): 43 mg/dL (ref 0–99)
Triglycerides: 105 mg/dL (ref 0–149)
VLDL Cholesterol Cal: 19 mg/dL (ref 5–40)

## 2020-03-28 LAB — CMP14+EGFR
ALT: 10 IU/L (ref 0–32)
AST: 17 IU/L (ref 0–40)
Albumin/Globulin Ratio: 1.8 (ref 1.2–2.2)
Albumin: 4.4 g/dL (ref 3.8–4.8)
Alkaline Phosphatase: 78 IU/L (ref 44–121)
BUN/Creatinine Ratio: 13 (ref 9–23)
BUN: 10 mg/dL (ref 6–24)
Bilirubin Total: 0.4 mg/dL (ref 0.0–1.2)
CO2: 23 mmol/L (ref 20–29)
Calcium: 9.3 mg/dL (ref 8.7–10.2)
Chloride: 100 mmol/L (ref 96–106)
Creatinine, Ser: 0.77 mg/dL (ref 0.57–1.00)
GFR calc Af Amer: 106 mL/min/{1.73_m2} (ref >59)
GFR calc non Af Amer: 92 mL/min/{1.73_m2} (ref >59)
Globulin, Total: 2.5 g/dL (ref 1.5–4.5)
Glucose: 178 mg/dL — ABNORMAL HIGH (ref 65–99)
Potassium: 4 mmol/L (ref 3.5–5.2)
Sodium: 134 mmol/L (ref 134–144)
Total Protein: 6.9 g/dL (ref 6.0–8.5)

## 2020-03-28 LAB — CBC WITH DIFFERENTIAL/PLATELET
Basophils Absolute: 0 10*3/uL (ref 0.0–0.2)
Basos: 0 %
EOS (ABSOLUTE): 0.1 10*3/uL (ref 0.0–0.4)
Eos: 2 %
Hematocrit: 35.1 % (ref 34.0–46.6)
Hemoglobin: 10.8 g/dL — ABNORMAL LOW (ref 11.1–15.9)
Immature Grans (Abs): 0 10*3/uL (ref 0.0–0.1)
Immature Granulocytes: 0 %
Lymphocytes Absolute: 1.2 10*3/uL (ref 0.7–3.1)
Lymphs: 21 %
MCH: 23.8 pg — ABNORMAL LOW (ref 26.6–33.0)
MCHC: 30.8 g/dL — ABNORMAL LOW (ref 31.5–35.7)
MCV: 78 fL — ABNORMAL LOW (ref 79–97)
Monocytes Absolute: 0.5 10*3/uL (ref 0.1–0.9)
Monocytes: 8 %
Neutrophils Absolute: 3.8 10*3/uL (ref 1.4–7.0)
Neutrophils: 69 %
Platelets: 251 10*3/uL (ref 150–450)
RBC: 4.53 x10E6/uL (ref 3.77–5.28)
RDW: 19.1 % — ABNORMAL HIGH (ref 11.7–15.4)
WBC: 5.5 10*3/uL (ref 3.4–10.8)

## 2020-04-11 DIAGNOSIS — Z20822 Contact with and (suspected) exposure to covid-19: Secondary | ICD-10-CM | POA: Diagnosis not present

## 2020-04-18 ENCOUNTER — Other Ambulatory Visit (INDEPENDENT_AMBULATORY_CARE_PROVIDER_SITE_OTHER): Payer: Self-pay | Admitting: Primary Care

## 2020-04-18 NOTE — Telephone Encounter (Signed)
Requested medication (s) are due for refill today: yes  Requested medication (s) are on the active medication list: yes  Last refill:  on order hx: 10/03/19 and expired 12/25/19  Future visit scheduled: yes  Notes to clinic:  per [facility] last RF 01/27/20   Requested Prescriptions  Pending Prescriptions Disp Refills   triamcinolone cream (KENALOG) 0.1 % [Pharmacy Med Name: TRIAMCINOLONE 0.1% CREAM] 454 g     Sig: APPLY TO [place]      Dermatology:  Corticosteroids Passed - 04/18/2020  9:58 AM      Passed - Valid encounter within last 12 months    Recent Outpatient Visits           3 weeks ago Type 2 diabetes mellitus without complication, without long-term current use of insulin ([place])   [place] [facility] Juluis Mire P, NP   2 months ago Type 2 diabetes mellitus without complication, without long-term current use of insulin ([place])   [place] [facility] Kerin Perna, NP   3 months ago Educated about management of weight   [place] Kerin Perna, NP   4 months ago Generalized edema   [place] Kerin Perna, NP   6 months ago Essential hypertension   [place], Michelle P, NP       Future Appointments             In 5 months Oletta Lamas, Milford Cage, NP [place]

## 2020-04-24 DIAGNOSIS — I10 Essential (primary) hypertension: Secondary | ICD-10-CM | POA: Diagnosis not present

## 2020-04-24 DIAGNOSIS — Q210 Ventricular septal defect: Secondary | ICD-10-CM | POA: Diagnosis not present

## 2020-05-07 ENCOUNTER — Other Ambulatory Visit: Payer: Self-pay

## 2020-05-07 ENCOUNTER — Ambulatory Visit
Admission: RE | Admit: 2020-05-07 | Discharge: 2020-05-07 | Disposition: A | Discharge status: Home / Self Care | Payer: Medicaid Other | Source: Ambulatory Visit | Attending: Primary Care | Admitting: Primary Care

## 2020-05-07 DIAGNOSIS — Z1231 Encounter for screening mammogram for malignant neoplasm of breast: Secondary | ICD-10-CM

## 2020-05-18 ENCOUNTER — Telehealth: Payer: Self-pay | Admitting: Primary Care

## 2020-05-18 NOTE — Telephone Encounter (Signed)
Copied from [place] [phone]. Topic: General - Other >> May 18, 2020 11:21 AM Rainey Pines A wrote: Patient would like a callback in regards to if Oletta Lamas can send in medication for her back spasms. Patient also requesting tylenol 3 as well. Please advise

## 2020-05-25 ENCOUNTER — Other Ambulatory Visit (INDEPENDENT_AMBULATORY_CARE_PROVIDER_SITE_OTHER): Payer: Self-pay | Admitting: Primary Care

## 2020-05-25 MED ORDER — METHOCARBAMOL 500 MG PO TABS: 500.0000 mg | ORAL_TABLET | Freq: Three times a day (TID) | ORAL | 0 refills | Status: DC | PRN

## 2020-05-26 NOTE — Telephone Encounter (Signed)
Patient is aware that medication has been sent for muscle spasms. Unable to prescribe narcotics.

## 2020-05-28 DIAGNOSIS — Z20822 Contact with and (suspected) exposure to covid-19: Secondary | ICD-10-CM | POA: Diagnosis not present

## 2020-06-22 ENCOUNTER — Ambulatory Visit (INDEPENDENT_AMBULATORY_CARE_PROVIDER_SITE_OTHER): Payer: Self-pay | Admitting: *Deleted

## 2020-06-22 DIAGNOSIS — E119 Type 2 diabetes mellitus without complications: Secondary | ICD-10-CM | POA: Diagnosis not present

## 2020-06-22 DIAGNOSIS — M25532 Pain in left wrist: Secondary | ICD-10-CM | POA: Diagnosis not present

## 2020-06-22 DIAGNOSIS — D6489 Other specified anemias: Secondary | ICD-10-CM | POA: Diagnosis not present

## 2020-06-22 DIAGNOSIS — M6281 Muscle weakness (generalized): Secondary | ICD-10-CM | POA: Diagnosis not present

## 2020-06-22 DIAGNOSIS — I10 Essential (primary) hypertension: Secondary | ICD-10-CM | POA: Diagnosis not present

## 2020-06-22 DIAGNOSIS — Z79899 Other long term (current) drug therapy: Secondary | ICD-10-CM | POA: Diagnosis not present

## 2020-06-22 DIAGNOSIS — M79642 Pain in left hand: Secondary | ICD-10-CM | POA: Diagnosis not present

## 2020-06-22 NOTE — Telephone Encounter (Signed)
Pain in the palm of her left hand only and unable to grip with that hand. Woke up with the pain yesterday. Report her B/P and cbg are normal today. Denies numbness/weakness/CP/SOB/slurred speech. Able to lift left arm. Lips/eyebrows symmetrical. No other symptoms. History of 6 TIA's she reported. With her history, advised evaluation today. She agreed.   Reason for Disposition . [1] Numbness (i.e., loss of sensation) of the face, arm / hand, or leg / foot on one side of the body AND [2] sudden onset AND [3] present now . Weakness (i.e., loss of strength) of new-onset in hand or fingers  (Exceptions: not truly weak, hand feels weak because of pain; weakness present > 2 weeks)  Protocols used: NEUROLOGIC DEFICIT-A-AH, HAND AND WRIST PAIN-A-AH

## 2020-06-30 ENCOUNTER — Other Ambulatory Visit (INDEPENDENT_AMBULATORY_CARE_PROVIDER_SITE_OTHER): Payer: Self-pay | Admitting: Primary Care

## 2020-06-30 DIAGNOSIS — I10 Essential (primary) hypertension: Secondary | ICD-10-CM

## 2020-06-30 NOTE — Telephone Encounter (Signed)
Requested medication (s) are due for refill today: yes  Requested medication (s) are on the active medication list: yes  Last refill:  05/25/20 #120  Future visit scheduled: yes  Notes to clinic:  Please review for refill. Refill not delegated per protocol    Requested Prescriptions  Pending Prescriptions Disp Refills   methocarbamol (ROBAXIN) 500 MG tablet [Pharmacy Med Name: METHOCARBAMOL 500 MG TABLET] 90 tablet 1    Sig: TAKE 1 TABLET BY MOUTH EVERY 8 HOURS AS NEEDED FOR MUSCLE SPASMS.      Not Delegated - Analgesics:  Muscle Relaxants Failed - 06/30/2020 12:03 PM      Failed - This refill cannot be delegated      Passed - Valid encounter within last 6 months    Recent Outpatient Visits           3 months ago Type 2 diabetes mellitus without complication, without long-term current use of insulin (HCC)   [place] [facility] Gwinda Passe P, NP   5 months ago Type 2 diabetes mellitus without complication, without long-term current use of insulin (HCC)   [place] [facility] Grayce Sessions, NP   6 months ago Educated about management of weight   [facility] Grayce Sessions, NP   6 months ago Generalized edema   [facility] Grayce Sessions, NP   9 months ago Essential hypertension   [facility] Grayce Sessions, NP       Future Appointments             In 2 months Randa Evens, Kinnie Scales, NP [facility]              Signed Prescriptions Disp Refills   triamcinolone (KENALOG) 0.1 % 454 g 1    Sig: APPLY TO AFFECTED AREA TWICE A DAY      Dermatology:  Corticosteroids Passed - 06/30/2020 12:03 PM      Passed - Valid encounter within last 12 months    Recent Outpatient Visits           3 months ago Type 2 diabetes mellitus without complication, without long-term current use of insulin (HCC)   [place] [facility]  Gwinda Passe P, NP   5 months ago Type 2 diabetes mellitus without complication, without long-term current use of insulin (HCC)   [facility] Grayce Sessions, NP   6 months ago Educated about management of weight   [facility] Grayce Sessions, NP   6 months ago Generalized edema   [facility] Grayce Sessions, NP   9 months ago Essential hypertension   [facility] Grayce Sessions, NP       Future Appointments             In 2 months Randa Evens, Kinnie Scales, NP [facility]              Refused Prescriptions Disp Refills   amLODipine (NORVASC) 5 MG tablet [Pharmacy Med Name: AMLODIPINE BESYLATE 5 MG TAB] 90 tablet 1    Sig: TAKE 1 TABLET BY MOUTH EVERY DAY      Cardiovascular:  Calcium Channel Blockers Passed - 06/30/2020 12:03 PM      Passed - Last BP in normal range    BP Readings from Last 1 Encounters:  03/27/20 118/83          Passed - Valid encounter within last 6 months    Recent Outpatient Visits           3 months ago Type 2 diabetes mellitus without complication, without long-term current use of insulin (HCC)   [place] [facility] Gwinda Passe P, NP   5 months ago Type 2 diabetes mellitus without complication, without long-term current use of insulin (HCC)   [place] [facility] Grayce Sessions, NP   6 months ago Educated about management of weight   [facility] Grayce Sessions, NP   6 months ago Generalized edema   [facility] Grayce Sessions, NP   9 months ago Essential hypertension   [facility] Grayce Sessions, NP       Future Appointments             In 2 months Randa Evens, Kinnie Scales, NP [facility]

## 2020-07-07 ENCOUNTER — Encounter (INDEPENDENT_AMBULATORY_CARE_PROVIDER_SITE_OTHER): Payer: Self-pay

## 2020-07-07 ENCOUNTER — Telehealth (INDEPENDENT_AMBULATORY_CARE_PROVIDER_SITE_OTHER): Payer: Medicaid Other | Admitting: Primary Care

## 2020-07-07 DIAGNOSIS — R21 Rash and other nonspecific skin eruption: Secondary | ICD-10-CM

## 2020-07-07 MED ORDER — CHLORHEXIDINE GLUCONATE 0.12% ORAL RINSE (MEDLINE KIT): 15.0000 mL | Freq: Two times a day (BID) | OROMUCOSAL | 0 refills | Status: DC

## 2020-07-07 NOTE — Progress Notes (Signed)
Acute Office Visit  Subjective:    [name], female    DOB: 03-11-1971, 50 y.o.   MRN: [id number]  Chief Complaint  Patient presents with  . Mouth Lesions    HPI Ms. Loukya Mccarey presented today to view her top lip that has white lesions across it. She complaints of pain swelling, unable to eat or drink . Returns to the car to complete visit after culture obtained  Past Medical History:  Diagnosis Date  . Asthma   . Breast discharge 01/10/2017  . Congenital ventricular septal defect   . Diabetes mellitus without complication ([place])   . Heart murmur   . Hypertension   . Obesity   . Pulmonary hypertension (HCC)    mild   . Tricuspid regurgitation   . VSD (ventricular septal defect), perimembranous    Qp:Qs 1.7:1 by catheterization     Past Surgical History:  Procedure Laterality Date  . CARDIAC CATHETERIZATION [name] 10/30/2014   Procedure: Right Heart Cath;  Surgeon: [name], MD;  Location: [facility] INVASIVE CV LAB CUPID;  Service: Cardiovascular;  Laterality: [name];  . CHOLECYSTECTOMY  2018  . CYST EXCISION    . LEFT AND RIGHT HEART CATHETERIZATION WITH CORONARY ANGIOGRAM [name] 09/11/2013   Procedure: LEFT AND RIGHT HEART CATHETERIZATION WITH CORONARY ANGIOGRAM;  Surgeon: [name], MD;  Location: [facility] CATH LAB;  Service: Cardiovascular;  Laterality: [name];  . RIGHT HEART CATH [name] 09/02/2016   Procedure: Right Heart Cath;  Surgeon: [name], MD;  Location: [place] CV LAB;  Service: Cardiovascular;  Laterality: [name];  . TUBAL LIGATION      Family History  Problem Relation Age of Onset  . Cancer Other   . Hyperlipidemia Other   . Hypertension Other   . Asthma Other   . Diabetes Mother   . Diabetes Father     Social History   Socioeconomic History  . Marital status: Single    Spouse name: Not on file  . Number of children: Not on file  . Years of education: Not on file  . Highest education level: Not on file  Occupational History  . Not on file   Tobacco Use  . Smoking status: Never Smoker  . Smokeless tobacco: Never Used  Vaping Use  . Vaping Use: Never used  Substance and Sexual Activity  . Alcohol use: No  . Drug use: No  . Sexual activity: Yes    Birth control/protection: Surgical  Other Topics Concern  . Not on file  Social History Narrative   Lives with boyfriend. On disability.    Social Determinants of Health   Financial Resource Strain: Not on file  Food Insecurity: Not on file  Transportation Needs: Not on file  Physical Activity: Not on file  Stress: Not on file  Social Connections: Not on file  Intimate Partner Violence: Not on file    Outpatient Medications Prior to Visit  Medication Sig Dispense Refill  . Accu-Chek FastClix Lancets MISC USE 2 TIMES DAILY AS DIRECTED 100 each 11  . albuterol (VENTOLIN HFA) 108 (90 Base) MCG/ACT inhaler Inhale 2 puffs into the lungs every 6 (six) hours as needed for wheezing or shortness of breath. 6.7 g 1  . amLODipine (NORVASC) 5 MG tablet Take 1 tablet (5 mg total) by mouth daily. 90 tablet 1  . ammonium lactate (LAC-HYDRIN) 12 % lotion Apply 1 application topically as needed for dry skin. 567 g 1  . budesonide-formoterol (SYMBICORT) 160-4.5  MCG/ACT inhaler Inhale 2 puffs into the lungs 2 (two) times daily. 1 Inhaler 5  . cetirizine (ZYRTEC) 10 MG tablet Take 1 tablet (10 mg total) by mouth daily. At bedtime as needed 30 tablet 11  . clopidogrel (PLAVIX) 75 MG tablet Take 1 tablet (75 mg total) by mouth daily. 28 tablet 0  . ferrous sulfate 325 (65 FE) MG tablet Take 1 tablet (325 mg total) by mouth daily with breakfast. 90 tablet 3  . fluticasone (FLONASE) 50 MCG/ACT nasal spray Place 2 sprays into both nostrils daily. 16 g 6  . glucose blood ([facility] GLUCOSE METER TEST STRIPS) test strip Use as instructed 100 each 12  . ibuprofen (ADVIL) 800 MG tablet Take 1 tablet (800 mg total) by mouth every 8 (eight) hours as needed for moderate pain. 90 tablet 1  . JANUMET 50-1000 MG  tablet Take 1 tablet by mouth 1 day or 1 dose. Take 1 tablet daily after breakfast 90 tablet 1  . methocarbamol (ROBAXIN) 500 MG tablet Take 1 tablet (500 mg total) by mouth every 8 (eight) hours as needed for muscle spasms. 120 tablet 0  . predniSONE (DELTASONE) 10 MG tablet Take 1 tablet (10 mg total) by mouth daily with breakfast. Dose pack Day 1 -6 tab, day 2-5 tab day 3- 4 tabs day 3- 3 tab  Day 4 2 tabs Day 5 1 tab 21 tablet 0  . rosuvastatin (CRESTOR) 20 MG tablet Take 1 tablet (20 mg total) by mouth at bedtime. 30 tablet 11  . topiramate (TOPAMAX) 25 MG tablet Take 3 tablets (75 mg total) by mouth as needed. 30 tablet 1  . triamcinolone (KENALOG) 0.1 % APPLY TO AFFECTED AREA TWICE A DAY 454 g 1   No facility-administered medications prior to visit.   Review of Systems  All other systems reviewed and are negative.      Objective:    Physical Exam  Deferred   There were no vitals taken for this visit. Wt Readings from Last 3 Encounters:  03/27/20 158 lb (71.7 kg)  01/27/20 128 lb 3.2 oz (58.2 kg)  12/25/19 157 lb 6.4 oz (71.4 kg)    Health Maintenance Due  Topic Date Due  . COVID-19 Vaccine (1) Never done  . OPHTHALMOLOGY EXAM  Never done  . COLONOSCOPY (Pts 45-33yrs Insurance coverage will need to be confirmed)  Never done    There are no preventive care reminders to display for this patient.   Lab Results  Component Value Date   TSH 2.180 11/14/2016   Lab Results  Component Value Date   WBC 5.5 03/27/2020   HGB 10.8 (L) 03/27/2020   HCT 35.1 03/27/2020   MCV 78 (L) 03/27/2020   PLT 251 03/27/2020   Lab Results  Component Value Date   NA 134 03/27/2020   K 4.0 03/27/2020   CO2 23 03/27/2020   GLUCOSE 178 (H) 03/27/2020   BUN 10 03/27/2020   CREATININE 0.77 03/27/2020   BILITOT 0.4 03/27/2020   ALKPHOS 78 03/27/2020   AST 17 03/27/2020   ALT 10 03/27/2020   PROT 6.9 03/27/2020   ALBUMIN 4.4 03/27/2020   CALCIUM 9.3 03/27/2020   ANIONGAP 11  09/24/2019   GFR 105.36 10/27/2014   Lab Results  Component Value Date   CHOL 109 03/27/2020   Lab Results  Component Value Date   HDL 47 03/27/2020   Lab Results  Component Value Date   LDLCALC 43 03/27/2020   Lab Results  Component Value Date   TRIG 105 03/27/2020   Lab Results  Component Value Date   CHOLHDL 2.3 03/27/2020   Lab Results  Component Value Date   HGBA1C 5.3 03/27/2020       Assessment & Plan:  [place] was seen today for mouth lesions.  Diagnoses and all orders for this visit:  Rash and nonspecific skin eruption -     Anaerobic and Aerobic Culture  No orders of the defined types were placed in this encounter.    Kerin Perna, NP

## 2020-07-08 ENCOUNTER — Other Ambulatory Visit (INDEPENDENT_AMBULATORY_CARE_PROVIDER_SITE_OTHER): Payer: Self-pay | Admitting: Primary Care

## 2020-07-08 MED ORDER — CHLORHEXIDINE GLUCONATE 0.12% ORAL RINSE (MEDLINE KIT): 15.0000 mL | Freq: Two times a day (BID) | OROMUCOSAL | 0 refills | Status: DC

## 2020-07-08 NOTE — Telephone Encounter (Signed)
Patient was seen virtually on 07/07/2020 by Juluis Mire, patient states pharmacy has not received chlorhexidine gluconate, MEDLINE KIT, (PERIDEX) 0.12 % solution (chart prescription class refects "Print") patient would like Rx resent, please advise.   *Patient contacted pharmacy and was advised Rx was not received.    [id number] [address] [place]:  [phone] Fax:  [phone]

## 2020-07-08 NOTE — Telephone Encounter (Signed)
   Notes to clinic:  Please resend script because it printed instead of going to pharmacy    Requested Prescriptions  Pending Prescriptions Disp Refills   chlorhexidine gluconate, MEDLINE KIT, (PERIDEX) 0.12 % solution 120 mL 0    Sig: Use as directed 15 mLs in the mouth or throat 2 (two) times daily.      Off-Protocol Failed - 07/08/2020 10:20 AM      Failed - Medication not assigned to a protocol, review manually.      Passed - Valid encounter within last 12 months    Recent Outpatient Visits           Yesterday Rash and nonspecific skin eruption   [place] Juluis Mire P, NP   3 months ago Type 2 diabetes mellitus without complication, without long-term current use of insulin ([place])   [place] [facility] Juluis Mire P, NP   5 months ago Type 2 diabetes mellitus without complication, without long-term current use of insulin ([place])   [place], Michelle P, NP   6 months ago Educated about management of weight   [place], [place], NP   7 months ago Generalized edema   [place], NP       Future Appointments             In 2 months Oletta Lamas, Mil[place] Cage, NP [place]

## 2020-07-11 LAB — ANAEROBIC AND AEROBIC CULTURE

## 2020-07-16 ENCOUNTER — Other Ambulatory Visit (INDEPENDENT_AMBULATORY_CARE_PROVIDER_SITE_OTHER): Payer: Self-pay | Admitting: Primary Care

## 2020-07-16 DIAGNOSIS — I10 Essential (primary) hypertension: Secondary | ICD-10-CM

## 2020-07-16 MED ORDER — AMLODIPINE BESYLATE 5 MG PO TABS: 5.0000 mg | ORAL_TABLET | Freq: Every day | ORAL | 1 refills | Status: DC

## 2020-07-25 DIAGNOSIS — Z20822 Contact with and (suspected) exposure to covid-19: Secondary | ICD-10-CM | POA: Diagnosis not present

## 2020-08-04 ENCOUNTER — Other Ambulatory Visit (INDEPENDENT_AMBULATORY_CARE_PROVIDER_SITE_OTHER): Payer: Self-pay | Admitting: Primary Care

## 2020-08-04 DIAGNOSIS — I10 Essential (primary) hypertension: Secondary | ICD-10-CM

## 2020-08-04 MED ORDER — AMLODIPINE BESYLATE 5 MG PO TABS: 5.0000 mg | ORAL_TABLET | Freq: Every day | ORAL | 1 refills | Status: DC

## 2020-08-25 ENCOUNTER — Other Ambulatory Visit (INDEPENDENT_AMBULATORY_CARE_PROVIDER_SITE_OTHER): Payer: Self-pay | Admitting: Primary Care

## 2020-08-25 NOTE — Telephone Encounter (Signed)
Med is not on Active med list or in historic or current meds- Please see note from 07/30/20

## 2020-08-25 NOTE — Telephone Encounter (Signed)
Requested medication (s) are due for refill today: yes  Requested medication (s) are on the active medication list: yes  Last refill:  08/05/19 #28 0 refills  Future visit scheduled: yes  Notes to clinic:  last provider to write Rx Lars Mage, MD. Do you want to refill Rx?      Requested Prescriptions  Pending Prescriptions Disp Refills   clopidogrel (PLAVIX) 75 MG tablet [Pharmacy Med Name: CLOPIDOGREL 75 MG TABLET] 30 tablet 11    Sig: TAKE 1 TABLET BY MOUTH EVERY DAY      Hematology: Antiplatelets - clopidogrel Failed - 08/25/2020  5:22 PM      Failed - Evaluate AST, ALT within 2 months of therapy initiation.      Failed - HGB in normal range and within 180 days    Hemoglobin  Date Value Ref Range Status  03/27/2020 10.8 (L) 11.1 - 15.9 g/dL Final          Passed - ALT in normal range and within 360 days    ALT  Date Value Ref Range Status  03/27/2020 10 0 - 32 IU/L Final          Passed - AST in normal range and within 360 days    AST  Date Value Ref Range Status  03/27/2020 17 0 - 40 IU/L Final          Passed - HCT in normal range and within 180 days    Hematocrit  Date Value Ref Range Status  03/27/2020 35.1 34.0 - 46.6 % Final          Passed - PLT in normal range and within 180 days    Platelets  Date Value Ref Range Status  03/27/2020 251 150 - 450 x10E3/uL Final          Passed - Valid encounter within last 6 months    Recent Outpatient Visits           1 month ago Rash and nonspecific skin eruption   [place], Michelle P, NP   5 months ago Type 2 diabetes mellitus without complication, without long-term current use of insulin ([place])   [place] [facility] Juluis Mire P, NP   7 months ago Type 2 diabetes mellitus without complication, without long-term current use of insulin ([place])   [place] [facility] Kerin Perna, NP   8 months ago Educated about management of  weight   [place], [place], NP   8 months ago Generalized edema   [place] Kerin Perna, NP       Future Appointments             In 1 month Oletta Lamas, [place] Cage, NP [place]

## 2020-09-06 ENCOUNTER — Other Ambulatory Visit (INDEPENDENT_AMBULATORY_CARE_PROVIDER_SITE_OTHER): Payer: Self-pay | Admitting: Primary Care

## 2020-09-06 NOTE — Telephone Encounter (Signed)
Requested medication (s) are due for refill today: no  Requested medication (s) are on the active medication list: yes  Last refill:  07/16/20 #90 1 RF  Future visit scheduled: yes  Notes to clinic:  NT to delegated to refuse or refill this med   Requested Prescriptions  Pending Prescriptions Disp Refills   methocarbamol (ROBAXIN) 500 MG tablet [Pharmacy Med Name: METHOCARBAMOL 500 MG TABLET] 90 tablet 1    Sig: TAKE 1 TABLET BY MOUTH EVERY 8 HOURS AS NEEDED FOR MUSCLE SPASM      Not Delegated - Analgesics:  Muscle Relaxants Failed - 09/06/2020  8:23 AM      Failed - This refill cannot be delegated      Passed - Valid encounter within last 6 months    Recent Outpatient Visits           2 months ago Rash and nonspecific skin eruption   [place] Juluis Mire P, NP   5 months ago Type 2 diabetes mellitus without complication, without long-term current use of insulin ([place])   [place], [place] P, NP   7 months ago Type 2 diabetes mellitus without complication, without long-term current use of insulin ([place])   [place], [place], NP   8 months ago Educated about management of weight   [place] Kerin Perna, NP   9 months ago Generalized edema   [place] Kerin Perna, NP       Future Appointments             In 2 weeks Oletta Lamas, [place] Cage, NP [place]

## 2020-09-10 ENCOUNTER — Telehealth (INDEPENDENT_AMBULATORY_CARE_PROVIDER_SITE_OTHER): Payer: Self-pay | Admitting: Primary Care

## 2020-09-10 NOTE — Telephone Encounter (Signed)
Call to patient to verify medication needed- per chart she is not taking atorvastatin- discontinued 12/05/19. Patient does have rosuvastatin on her current list. Left message to call back to verify medication needed.

## 2020-09-10 NOTE — Telephone Encounter (Signed)
Medication Refill - Medication: Atorvastatin   Has the patient contacted their pharmacy? Yes.  Pt called stating that she is completely out of this medication. She states that she contacted pharmacy to have this refilled and that they are waiting on PCP to respond. Please advise.  (Agent: If no, request that the patient contact the pharmacy for the refill.) (Agent: If yes, when and what did the pharmacy advise?)  Preferred Pharmacy (with phone number or street name):  [id number] - [place], [place] - [address]  [address] [place] [address]  Phone: [phone] Fax: [phone]  Hours: Not open 24 hours     Agent: Please be advised that RX refills may take up to 3 business days. We ask that you follow-up with your pharmacy.'

## 2020-09-10 NOTE — Telephone Encounter (Signed)
Patient has called in to provide additional verification The medication they share they're in need of is Atorvastatin 40mg  Patient shares that they're requesting a three month supply of the medication

## 2020-09-11 NOTE — Telephone Encounter (Signed)
Per DPR left voicemail notifying patient that she is not supposed to be taking atorvastatin that was discontinued in 11/2019. She is to be on Rosuvastatin and has refills at the pharmacy. Contact pharmacy for refills.

## 2020-09-11 NOTE — Telephone Encounter (Signed)
Patient has called in to provide additional verification The medication they share they're in need of is Atorvastatin 40mg  Patient shares that they're requesting a three month supply of the medication.  Requested medication (s) are due for refill today: Atorvastatin, yes  Requested medication (s) are on the active medication list: no  Last refill: ?  Future visit scheduled: yes  Notes to clinic: see above note; med d/c'd 12/05/19 due change in therapy

## 2020-09-24 ENCOUNTER — Other Ambulatory Visit (INDEPENDENT_AMBULATORY_CARE_PROVIDER_SITE_OTHER): Payer: Self-pay | Admitting: Primary Care

## 2020-09-25 ENCOUNTER — Ambulatory Visit (INDEPENDENT_AMBULATORY_CARE_PROVIDER_SITE_OTHER): Payer: Medicaid Other | Admitting: Primary Care

## 2020-10-29 ENCOUNTER — Other Ambulatory Visit: Payer: Self-pay

## 2020-12-01 ENCOUNTER — Ambulatory Visit (INDEPENDENT_AMBULATORY_CARE_PROVIDER_SITE_OTHER): Payer: Self-pay | Admitting: *Deleted

## 2020-12-01 NOTE — Telephone Encounter (Signed)
Pt reports multiple sore left nare. States dry, yellowish, painful. States had similar sores on mouth months ago. Reports was seen by Sharyn Lull for nasal sores, did not see noted. Was advised to use nasal spray, Vaseline. States "Not working." Advised mobile clinic. States will go if she can secure a ride. Advised UC if worsens, fever. Assured pt NT would route to practice for PCPs review.   Reason for Disposition . [1] Unexplained sores AND [2] 3 or more  Answer Assessment - Initial Assessment Questions 1. APPEARANCE of SORES: "What do the sores look like?"     Yellowish 2. NUMBER: "How many sores are there?"     Multiple 3. SIZE: "How big is the largest sore?"     unsure 4. LOCATION: "Where are the sores located?"     Left nare 5. ONSET: "When did the sores begin?"     2 months ago 6. CAUSE: "What do you think is causing the sores?"     Unsure 7. OTHER SYMPTOMS: "Do you have any other symptoms?" (e.g., fever, new weakness)     No  Protocols used: SORES-A-AH

## 2020-12-03 NOTE — Telephone Encounter (Signed)
Have not heard prior to now sores in her nose

## 2020-12-07 ENCOUNTER — Telehealth (INDEPENDENT_AMBULATORY_CARE_PROVIDER_SITE_OTHER): Payer: Self-pay | Admitting: Primary Care

## 2020-12-07 ENCOUNTER — Encounter (INDEPENDENT_AMBULATORY_CARE_PROVIDER_SITE_OTHER): Payer: Self-pay | Admitting: Primary Care

## 2020-12-07 ENCOUNTER — Ambulatory Visit (INDEPENDENT_AMBULATORY_CARE_PROVIDER_SITE_OTHER): Payer: Medicaid Other | Admitting: Primary Care

## 2020-12-07 ENCOUNTER — Other Ambulatory Visit: Payer: Self-pay

## 2020-12-07 VITALS — BP 121/78 | HR 72 | Temp 97.5°F | Ht 59.0 in | Wt 155.6 lb

## 2020-12-07 DIAGNOSIS — J3489 Other specified disorders of nose and nasal sinuses: Secondary | ICD-10-CM

## 2020-12-07 NOTE — Telephone Encounter (Signed)
Pt is calling because she received a call from [facility]- they stated that they do not accept medicaid. Can another referral be placed for the pt at an office that accepts medicaid please? CB- [phone]

## 2020-12-07 NOTE — Progress Notes (Addendum)
Acute Office Visit  Subjective:    [name], female    DOB: 09-19-1970, 50 y.o.   MRN: [id number]  Chief Complaint  Patient presents with  . Referral    HPI [name] is a 50 year old  obese female presents today for an acute visit she has sores in her left nostrils that seems to be getting larger and causing he respiratory distress. Requesting referral to ENT.  Past Medical History:  Diagnosis Date  . Asthma   . Breast discharge 01/10/2017  . Congenital ventricular septal defect   . Diabetes mellitus without complication ([place])   . Heart murmur   . Hypertension   . Obesity   . Pulmonary hypertension (HCC)    mild   . Tricuspid regurgitation   . VSD (ventricular septal defect), perimembranous    Qp:Qs 1.7:1 by catheterization     Past Surgical History:  Procedure Laterality Date  . CARDIAC CATHETERIZATION [name] 10/30/2014   Procedure: Right Heart Cath;  Surgeon: [name], MD;  Location: [facility] INVASIVE CV LAB CUPID;  Service: Cardiovascular;  Laterality: [name];  . CHOLECYSTECTOMY  2018  . CYST EXCISION    . LEFT AND RIGHT HEART CATHETERIZATION WITH CORONARY ANGIOGRAM [name] 09/11/2013   Procedure: LEFT AND RIGHT HEART CATHETERIZATION WITH CORONARY ANGIOGRAM;  Surgeon: [name], MD;  Location: [facility] CATH LAB;  Service: Cardiovascular;  Laterality: [name];  . RIGHT HEART CATH [name] 09/02/2016   Procedure: Right Heart Cath;  Surgeon: [name], MD;  Location: [facility] CV LAB;  Service: Cardiovascular;  Laterality: [name];  . TUBAL LIGATION      Family History  Problem Relation Age of Onset  . Cancer Other   . Hyperlipidemia Other   . Hypertension Other   . Asthma Other   . Diabetes Mother   . Diabetes Father     Social History   Socioeconomic History  . Marital status: Single    Spouse name: Not on file  . Number of children: Not on file  . Years of education: Not on file  . Highest education level: Not on file  Occupational History  . Not on  file  Tobacco Use  . Smoking status: Never  . Smokeless tobacco: Never  Vaping Use  . Vaping Use: Never used  Substance and Sexual Activity  . Alcohol use: No  . Drug use: No  . Sexual activity: Yes    Birth control/protection: Surgical  Other Topics Concern  . Not on file  Social History Narrative   Lives with boyfriend. On disability.    Social Determinants of Health   Financial Resource Strain: Not on file  Food Insecurity: Not on file  Transportation Needs: Not on file  Physical Activity: Not on file  Stress: Not on file  Social Connections: Not on file  Intimate Partner Violence: Not on file    Outpatient Medications Prior to Visit  Medication Sig Dispense Refill  . Accu-Chek FastClix Lancets MISC USE 2 TIMES DAILY AS DIRECTED 100 each 11  . albuterol (VENTOLIN HFA) 108 (90 Base) MCG/ACT inhaler Inhale 2 puffs into the lungs every 6 (six) hours as needed for wheezing or shortness of breath. 6.7 g 1  . amLODipine (NORVASC) 5 MG tablet Take 1 tablet (5 mg total) by mouth daily. 90 tablet 1  . ammonium lactate (LAC-HYDRIN) 12 % lotion Apply 1 application topically as needed for dry skin. 567 g 1  . budesonide-formoterol (SYMBICORT) 160-4.5 MCG/ACT  inhaler Inhale 2 puffs into the lungs 2 (two) times daily. 1 Inhaler 5  . cetirizine (ZYRTEC) 10 MG tablet Take 1 tablet (10 mg total) by mouth daily. At bedtime as needed 30 tablet 11  . chlorhexidine gluconate, MEDLINE KIT, (PERIDEX) 0.12 % solution Use as directed 15 mLs in the mouth or throat 2 (two) times daily. 120 mL 0  . clopidogrel (PLAVIX) 75 MG tablet Take 1 tablet (75 mg total) by mouth daily. 28 tablet 0  . ferrous sulfate 325 (65 FE) MG tablet Take 1 tablet (325 mg total) by mouth daily with breakfast. 90 tablet 3  . fluticasone (FLONASE) 50 MCG/ACT nasal spray Place 2 sprays into both nostrils daily. 16 g 6  . glucose blood ([facility] GLUCOSE METER TEST STRIPS) test strip Use as instructed 100 each 12  . ibuprofen (ADVIL)  800 MG tablet Take 1 tablet (800 mg total) by mouth every 8 (eight) hours as needed for moderate pain. 90 tablet 1  . JANUMET 50-1000 MG tablet TAKE 1 TABLET DAILY AFTER BREAKFAST 90 tablet 1  . methocarbamol (ROBAXIN) 500 MG tablet TAKE 1 TABLET BY MOUTH EVERY 8 HOURS AS NEEDED FOR MUSCLE SPASM 90 tablet 0  . predniSONE (DELTASONE) 10 MG tablet Take 1 tablet (10 mg total) by mouth daily with breakfast. Dose pack Day 1 -6 tab, day 2-5 tab day 3- 4 tabs day 3- 3 tab  Day 4 2 tabs Day 5 1 tab 21 tablet 0  . rosuvastatin (CRESTOR) 20 MG tablet Take 1 tablet (20 mg total) by mouth at bedtime. 30 tablet 11  . topiramate (TOPAMAX) 25 MG tablet Take 3 tablets (75 mg total) by mouth as needed. 30 tablet 1  . triamcinolone (KENALOG) 0.1 % APPLY TO AFFECTED AREA TWICE A DAY 454 g 1   No facility-administered medications prior to visit.    Allergies  Allergen Reactions  . Penicillins Anaphylaxis    Has patient had a PCN reaction causing immediate rash, facial/tongue/throat swelling, SOB or lightheadedness with hypotension: Yes Has patient had a PCN reaction causing severe rash involving mucus membranes or skin necrosis: Yes Has patient had a PCN reaction that required hospitalization Yes- went to ED, was not admitted Has patient had a PCN reaction occurring within the last 10 years: No- longer then 10 years If all of the above answers are "NO", then may proceed with Cephalosporin use.   . Tape Rash    Other reaction(s): Other (See Comments) Burns skin  . Other Rash    Other reaction(s): Other (See Comments) **EKG GEL PADS**  "Burns my skin" Pt states she is allergic to a blood pressure medication, unsure.  **EKG GEL PADS**  "Burns my skin"  . Drug Ingredient [Benzonatate] Other (See Comments) and Cough    Makes cough worse  . Latex Rash    Review of Systems  HENT:         Left nostril sores   Respiratory:  Positive for shortness of breath.        Left side sores   All other systems  reviewed and are negative.     Objective:    Physical Exam Vitals reviewed.  Constitutional:      Appearance: She is obese.  HENT:     Head: Normocephalic.     Nose: Nose normal.  Eyes:     Extraocular Movements: Extraocular movements intact.  Cardiovascular:     Rate and Rhythm: Normal rate and regular rhythm.  Heart sounds: Murmur heard.  Pulmonary:     Effort: Pulmonary effort is normal.     Breath sounds: Normal breath sounds.  Abdominal:     General: Bowel sounds are normal. There is distension.     Palpations: Abdomen is soft.  Musculoskeletal:        General: Normal range of motion.     Cervical back: Normal range of motion.  Skin:    General: Skin is warm and dry.  Neurological:     Mental Status: She is alert and oriented to person, place, and time.  Psychiatric:        Mood and Affect: Mood normal.        Behavior: Behavior normal.        Thought Content: Thought content normal.        Judgment: Judgment normal.   BP 121/78 (BP Location: Left Arm, Patient Position: Sitting, Cuff Size: Normal)   Pulse 72   Temp (!) 97.5 F (36.4 C) (Temporal)   Ht _0  (1.499 m)   Wt 155 lb 9.6 oz (70.6 kg)   SpO2 99%   BMI 31.43 kg/m  Wt Readings from Last 3 Encounters:  12/07/20 155 lb 9.6 oz (70.6 kg)  03/27/20 158 lb (71.7 kg)  01/27/20 128 lb 3.2 oz (58.2 kg)    Health Maintenance Due  Topic Date Due  . COVID-19 Vaccine (1) Never done  . OPHTHALMOLOGY EXAM  Never done  . Zoster Vaccines- Shingrix (1 of 2) Never done  . COLONOSCOPY (Pts 45-51yr Insurance coverage will need to be confirmed)  Never done  . FOOT EXAM  07/25/2020  . HEMOGLOBIN A1C  09/25/2020  . URINE MICROALBUMIN  01/26/2021    There are no preventive care reminders to display for this patient.   Lab Results  Component Value Date   TSH 2.180 11/14/2016   Lab Results  Component Value Date   WBC 5.5 03/27/2020   HGB 10.8 (L) 03/27/2020   HCT 35.1 03/27/2020   MCV 78 (L)  03/27/2020   PLT 251 03/27/2020   Lab Results  Component Value Date   NA 134 03/27/2020   K 4.0 03/27/2020   CO2 23 03/27/2020   GLUCOSE 178 (H) 03/27/2020   BUN 10 03/27/2020   CREATININE 0.77 03/27/2020   BILITOT 0.4 03/27/2020   ALKPHOS 78 03/27/2020   AST 17 03/27/2020   ALT 10 03/27/2020   PROT 6.9 03/27/2020   ALBUMIN 4.4 03/27/2020   CALCIUM 9.3 03/27/2020   ANIONGAP 11 09/24/2019   GFR 105.36 10/27/2014   Lab Results  Component Value Date   CHOL 109 03/27/2020   Lab Results  Component Value Date   HDL 47 03/27/2020   Lab Results  Component Value Date   LDLCALC 43 03/27/2020   Lab Results  Component Value Date   TRIG 105 03/27/2020   Lab Results  Component Value Date   CHOLHDL 2.3 03/27/2020   Lab Results  Component Value Date   HGBA1C 5.3 03/27/2020       Assessment & Plan:    [place] seen today for referral.  Diagnoses and all orders for this visit:  Nostril sore See HPI -     Ambulatory referral to ENT      MKerin Perna NP

## 2020-12-14 DIAGNOSIS — M79662 Pain in left lower leg: Secondary | ICD-10-CM | POA: Diagnosis not present

## 2020-12-14 DIAGNOSIS — Z79899 Other long term (current) drug therapy: Secondary | ICD-10-CM | POA: Diagnosis not present

## 2020-12-14 DIAGNOSIS — I10 Essential (primary) hypertension: Secondary | ICD-10-CM | POA: Diagnosis not present

## 2020-12-14 DIAGNOSIS — M79604 Pain in right leg: Secondary | ICD-10-CM | POA: Diagnosis not present

## 2020-12-14 DIAGNOSIS — R031 Nonspecific low blood-pressure reading: Secondary | ICD-10-CM | POA: Diagnosis not present

## 2020-12-14 DIAGNOSIS — M79661 Pain in right lower leg: Secondary | ICD-10-CM | POA: Diagnosis not present

## 2020-12-17 NOTE — Telephone Encounter (Signed)
See below. Can you enter the referral pls

## 2020-12-23 ENCOUNTER — Other Ambulatory Visit (INDEPENDENT_AMBULATORY_CARE_PROVIDER_SITE_OTHER): Payer: Self-pay | Admitting: Primary Care

## 2020-12-23 DIAGNOSIS — J3489 Other specified disorders of nose and nasal sinuses: Secondary | ICD-10-CM

## 2020-12-23 NOTE — Telephone Encounter (Signed)
Patient tried to call office Dr Melene Plan does not take her insurance

## 2021-01-27 IMAGING — MG DIGITAL SCREENING BILAT W/ TOMO W/ CAD
8 series · 8 of 24 positions shown · non-contrast
Comparison: Previous exam(s).

CLINICAL DATA: Screening.

EXAM:
DIGITAL SCREENING BILATERAL MAMMOGRAM WITH TOMO AND CAD

[L MLO synth-2D]
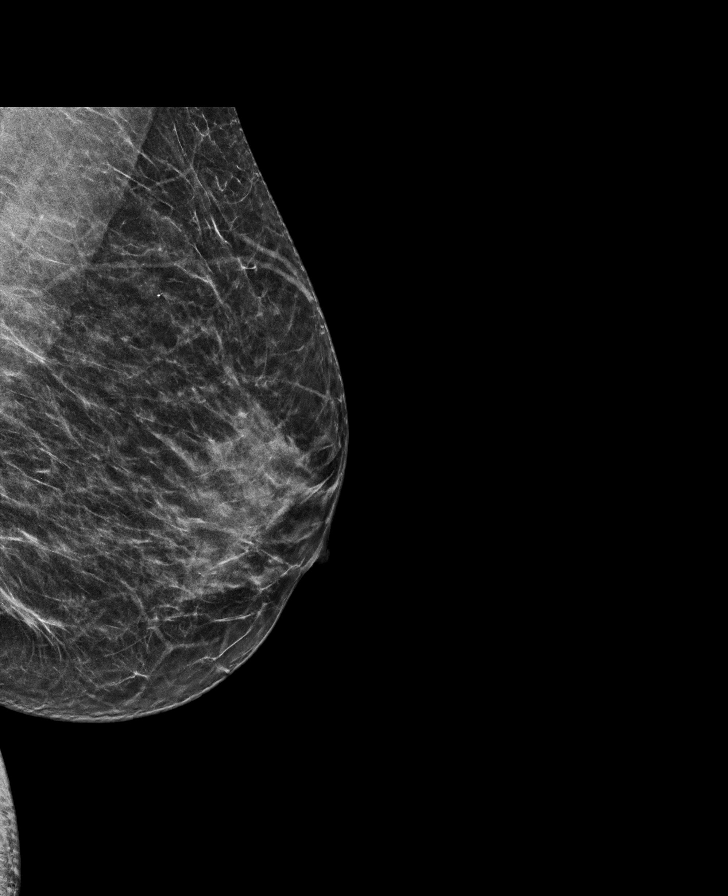

[L CC synth-2D]
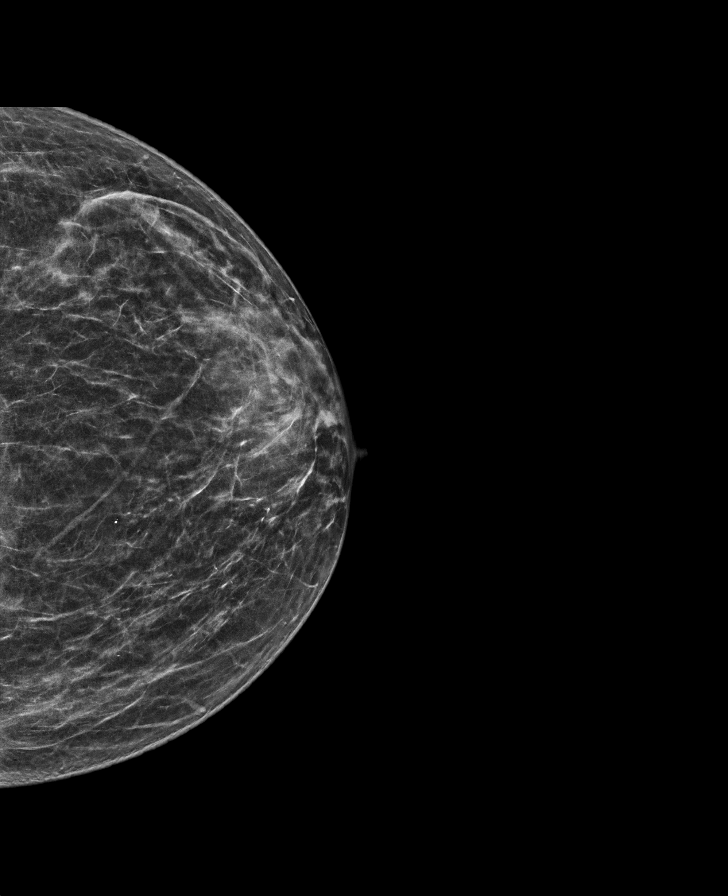

[R MLO synth-2D]
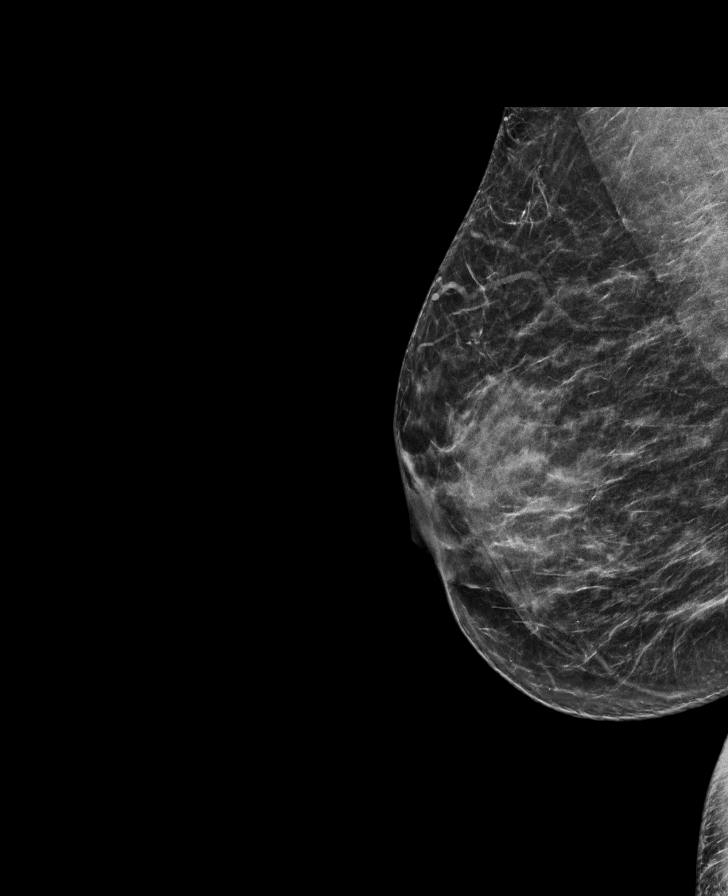

[R CC synth-2D]
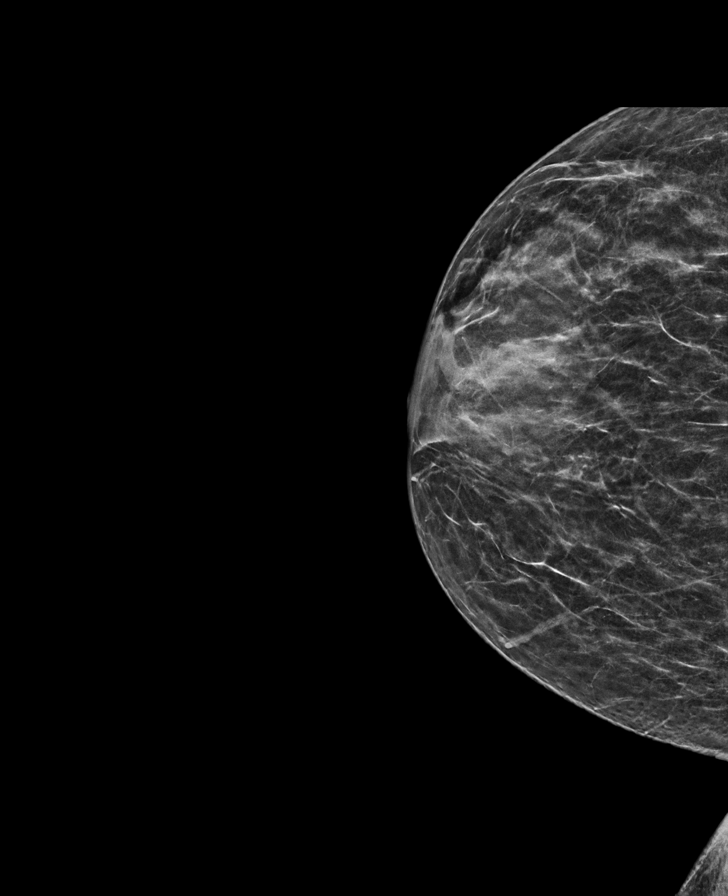

[L CC tomo · tomo slice 29/58.0]
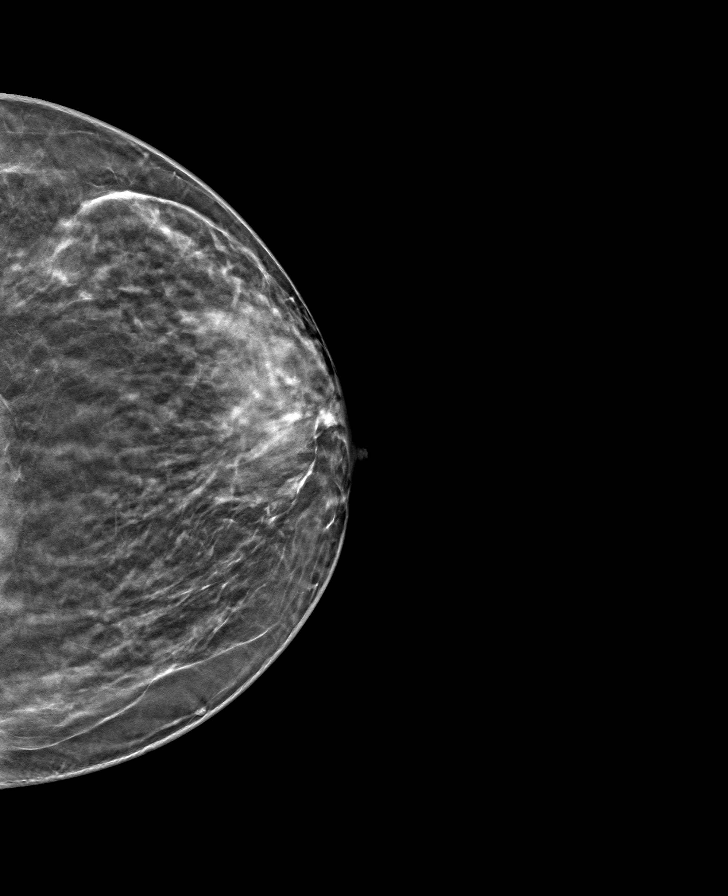

[R MLO tomo · tomo slice 33/64.0]
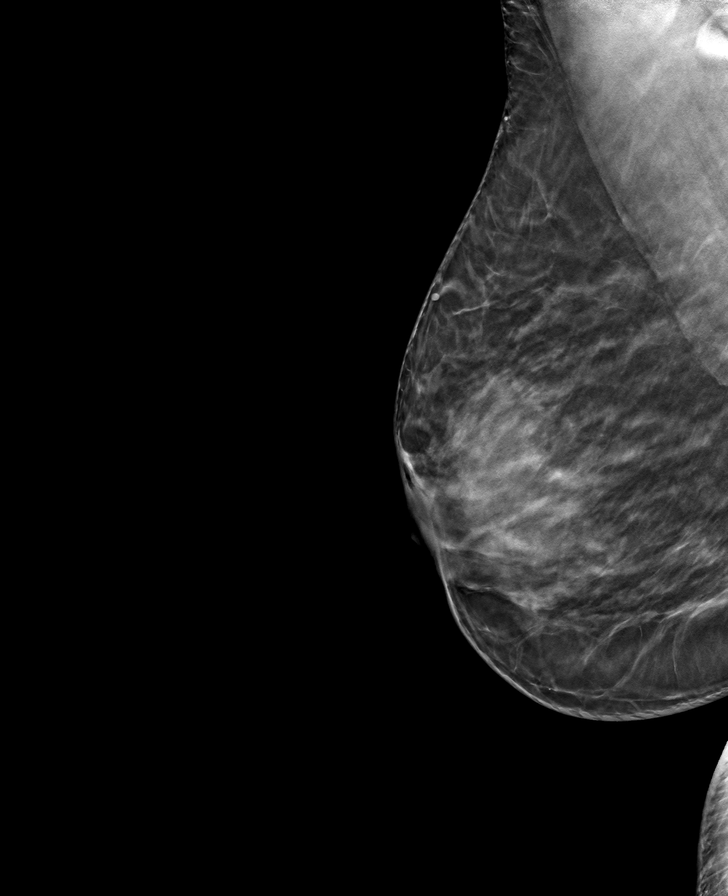

[L MLO tomo · tomo slice 31/60.0]
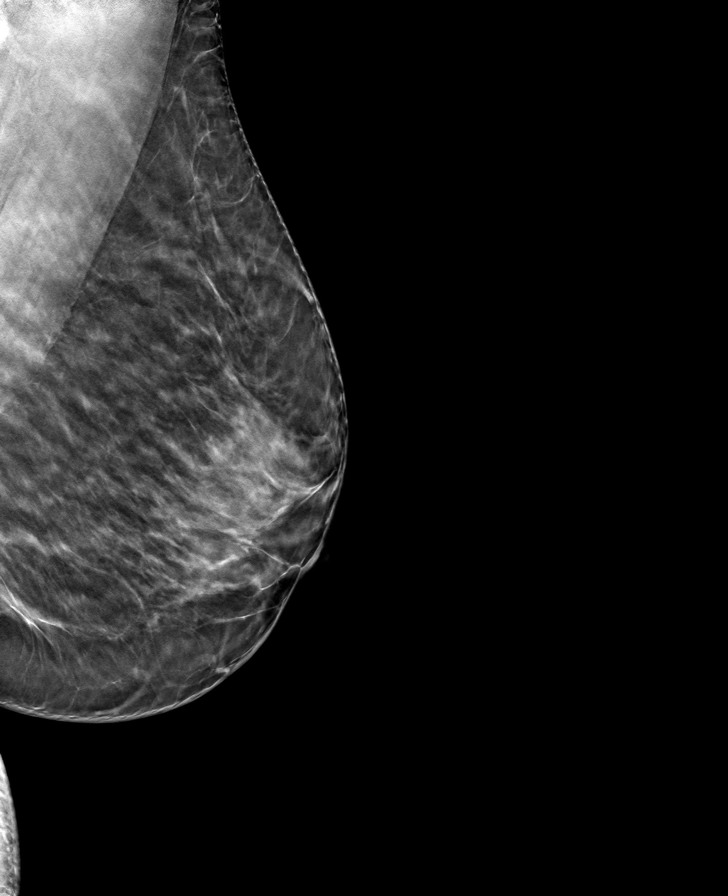

[R CC tomo · tomo slice 27/53.0]
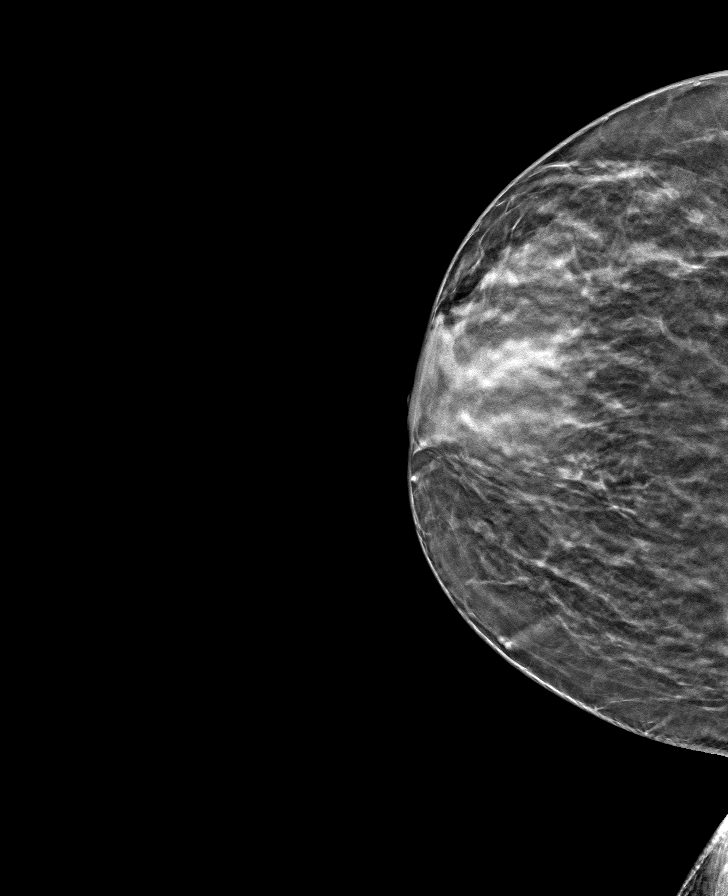

[8 of 24 positions shown; findings below may reference images not displayed]

ACR Breast Density Category c: The breast tissue is heterogeneously
dense, which may obscure small masses.
FINDINGS: There are no findings suspicious for malignancy. Images were
processed with CAD.
IMPRESSION: No mammographic evidence of malignancy. A result letter of this
screening mammogram will be mailed directly to the patient.

RECOMMENDATION:
Screening mammogram in one year. (Code:[id number])

BI-RADS CATEGORY  1: Negative.

## 2021-03-08 ENCOUNTER — Other Ambulatory Visit (INDEPENDENT_AMBULATORY_CARE_PROVIDER_SITE_OTHER): Payer: Self-pay | Admitting: Primary Care

## 2021-03-09 ENCOUNTER — Other Ambulatory Visit (INDEPENDENT_AMBULATORY_CARE_PROVIDER_SITE_OTHER): Payer: Self-pay | Admitting: Primary Care

## 2021-03-09 DIAGNOSIS — J309 Allergic rhinitis, unspecified: Secondary | ICD-10-CM

## 2021-03-09 DIAGNOSIS — J4520 Mild intermittent asthma, uncomplicated: Secondary | ICD-10-CM

## 2021-03-09 NOTE — Telephone Encounter (Signed)
Sent to PCP ?

## 2021-03-09 NOTE — Telephone Encounter (Signed)
Sent to PCP to refill if appropriate.  

## 2021-03-09 NOTE — Telephone Encounter (Signed)
Requested medications are due for refill today.  yes  Requested medications are on the active medications list.  yes  Last refill. Ammonium Lactate 10/03/2019, Ibuprofen 01/27/2020, Janumet  09/24/2020  Future visit scheduled.   yes  Notes to clinic.  Pt was last seen for Diabetes 03/27/2020, Skin issues 01/27/2020 and I am unable to find last OV for Ibuprofen.

## 2021-03-11 ENCOUNTER — Other Ambulatory Visit (INDEPENDENT_AMBULATORY_CARE_PROVIDER_SITE_OTHER): Payer: Self-pay | Admitting: Primary Care

## 2021-03-11 NOTE — Telephone Encounter (Signed)
[facility] [address] to let them know the ibuprofen rx had been refused.  An error message was received that the transmission did not go through and to call and report this to the pharmacy.    Due to prolonged hold time I hung up.   The rx was taken care of via another means.

## 2021-03-17 ENCOUNTER — Other Ambulatory Visit (INDEPENDENT_AMBULATORY_CARE_PROVIDER_SITE_OTHER): Payer: Self-pay | Admitting: Primary Care

## 2021-03-17 DIAGNOSIS — B009 Herpesviral infection, unspecified: Secondary | ICD-10-CM

## 2021-03-17 MED ORDER — ACYCLOVIR 400 MG PO TABS
400.0000 mg | ORAL_TABLET | Freq: Two times a day (BID) | ORAL | 1 refills | Status: DC
Start: 2021-03-17 — End: 2022-04-27

## 2021-03-17 NOTE — Progress Notes (Signed)
Mackenzie Patterson is 50 year old presented in the office without appt. Requesting HSV medication per her dentist request. She has been have white patches in her mouth off and on for several months.

## 2021-03-24 ENCOUNTER — Other Ambulatory Visit: Payer: Self-pay

## 2021-03-24 ENCOUNTER — Ambulatory Visit (INDEPENDENT_AMBULATORY_CARE_PROVIDER_SITE_OTHER): Payer: Medicaid Other | Admitting: Primary Care

## 2021-03-24 ENCOUNTER — Encounter (INDEPENDENT_AMBULATORY_CARE_PROVIDER_SITE_OTHER): Payer: Self-pay | Admitting: Primary Care

## 2021-03-24 VITALS — BP 129/80 | HR 90 | Temp 97.3°F | Ht 59.0 in | Wt 154.9 lb

## 2021-03-24 DIAGNOSIS — I10 Essential (primary) hypertension: Secondary | ICD-10-CM

## 2021-03-24 DIAGNOSIS — L309 Dermatitis, unspecified: Secondary | ICD-10-CM

## 2021-03-24 DIAGNOSIS — L500 Allergic urticaria: Secondary | ICD-10-CM | POA: Diagnosis not present

## 2021-03-24 DIAGNOSIS — E119 Type 2 diabetes mellitus without complications: Secondary | ICD-10-CM

## 2021-03-24 LAB — POCT GLYCOSYLATED HEMOGLOBIN (HGB A1C): Hemoglobin A1C: 5.4 % (ref 4.0–5.6)

## 2021-03-24 MED ORDER — IBUPROFEN 800 MG PO TABS: ORAL_TABLET | ORAL | 1 refills | Status: DC

## 2021-03-24 MED ORDER — AMLODIPINE BESYLATE 5 MG PO TABS: 5.0000 mg | ORAL_TABLET | Freq: Every day | ORAL | 1 refills | Status: AC

## 2021-03-24 MED ORDER — TRIAMCINOLONE ACETONIDE 0.1 % EX CREA: TOPICAL_CREAM | Freq: Three times a day (TID) | CUTANEOUS | 1 refills | Status: DC | PRN

## 2021-03-24 MED ORDER — TRIAMCINOLONE ACETONIDE 0.1 % EX CREA: TOPICAL_CREAM | CUTANEOUS | 1 refills | Status: DC

## 2021-03-24 MED ORDER — METFORMIN HCL ER 500 MG PO TB24: 500.0000 mg | ORAL_TABLET | Freq: Every day | ORAL | 1 refills | Status: DC

## 2021-03-24 MED ORDER — HYDROXYZINE PAMOATE 25 MG PO CAPS: 25.0000 mg | ORAL_CAPSULE | Freq: Three times a day (TID) | ORAL | 1 refills | Status: DC | PRN

## 2021-03-24 NOTE — Patient Instructions (Signed)
Eczema Eczema refers to a group of skin conditions that cause skin to become rough and inflamed. Each type of eczema has different triggers, symptoms, and treatments. Eczema of any type is usually itchy. Symptoms range from mild to severe. Eczema is not spread from person to person (is not contagious). It can appear on different parts of the body at different times. One person's eczema may look different from another person's eczema. What are the causes? The exact cause of this condition is not known. However, exposure to certain environmental factors, irritants, and allergens can make the condition worse. What are the signs or symptoms? Symptoms of this condition depend on the type of eczema you have. The types include: Contact dermatitis. There are two kinds: Irritant contact dermatitis. This happens when something irritates the skin and causes a rash. Allergic contact dermatitis. This happens when your skin comes in contact with something you are allergic to (allergens). This can include poison ivy, chemicals, or medicines that were applied to your skin. Atopic dermatitis. This is a long-term (chronic) skin disease that keeps coming back (recurring). It is the most common type of eczema. Usual symptoms are a red rash and itchy, dry, scaly skin. It usually starts showing signs in infancy and can last through adulthood. Dyshidrotic eczema. This is a form of eczema on the hands and feet. It shows up as very itchy, fluid-filled blisters. It can affect people of any age but is more common before age 40. Hand eczema. This causes very itchy areas of skin on the palms and sides of the hands and fingers. This type of eczema is common in industrial jobs where you may be exposed to different types of irritants. Lichen simplex chronicus. This type of eczema occurs when a person constantly scratches one area of the body. Repeated scratching of the area leads to thickened skin (lichenification). This condition can  accompany other types of eczema. It is more common in adults but may also be seen in children. Nummular eczema. This is a common type of eczema that most often affects the lower legs and the backs of the hands. It typically causes an itchy, red, circular, crusty lesion (plaque). Scratching may become a habit and can cause bleeding. Nummular eczema occurs most often in middle-aged or older people. Seborrheic dermatitis. This is a common skin disease that mainly affects the scalp. It may also affect other oily areas of the body, such as the face, sides of the nose, eyebrows, ears, eyelids, and chest. It is marked by small scaling and redness of the skin (erythema). This can affect people of all ages. In infants, this condition is called cradle cap. Stasis dermatitis. This is a common skin disease that can cause itching, scaling, and hyperpigmentation, usually on the legs and feet. It occurs most often in people who have a condition that prevents blood from being pumped through the veins in the legs (chronic venous insufficiency). Stasis dermatitis is a chronic condition that needs long-term management. How is this diagnosed? This condition may be diagnosed based on: A physical exam of your skin. Your medical history. Skin patch tests. These tests involve using patches that contain possible allergens and placing them on your back. Your health care provider will check in a few days to see if an allergic reaction occurred. How is this treated? Treatment for eczema is based on the type of eczema you have. You may be given hydrocortisone steroid medicine or antihistamines. These can relieve itching quickly and help reduce inflammation.   These may be prescribed or purchased over the counter, depending on the strength that is needed. Follow these instructions at home: Take or apply over-the-counter and prescription medicines only as told by your health care provider. Use creams or ointments to moisturize your  skin. Do not use lotions. Learn what triggers or irritates your symptoms so you can avoid these things. Treat symptom flare-ups quickly. Do not scratch your skin. This can make your rash worse. Keep all follow-up visits. This is important. Where to find more information American Academy of Dermatology: aad.org National Eczema Association: nationaleczema.org The Society for Pediatric Dermatology: pedsderm.net Contact a health care provider if: You have severe itching, even with treatment. You scratch your skin regularly until it bleeds. Your rash looks different than usual. Your skin is painful, swollen, or more red than usual. You have a fever. Summary Eczema refers to a group of skin conditions that cause skin to become rough and inflamed. Each type has different triggers. Eczema of any type causes itching that may range from mild to severe. Treatment varies based on the type of eczema you have. Hydrocortisone steroid medicine or antihistamines can help with itching and inflammation. Protecting your skin is the best way to prevent eczema. Use creams or ointments to moisturize your skin. Avoid triggers and irritants. Treat flare-ups quickly. This information is not intended to replace advice given to you by your health care provider. Make sure you discuss any questions you have with your health care provider. Document Revised: 03/23/2020 Document Reviewed: 03/23/2020 Elsevier Patient Education  2022 Elsevier Inc.  

## 2021-03-24 NOTE — Progress Notes (Signed)
  [facility]   Subjective:     Mackenzie Patterson is an 50 y.o. female who presents for evaluation and treatment of a rash. Onset of symptoms was several weeks ago, and has been unchanged since that time. Risk factors include: none .The following portions of the patient's history were reviewed and updated as appropriate: allergies, current medications, past family history, past medical history, past social history, and past surgical history.  Review of Systems Pertinent items noted in HPI and remainder of comprehensive ROS otherwise negative.   Objective:    BP 129/80 (BP Location: Left Arm, Patient Position: Sitting, Cuff Size: Normal)   Pulse 90   Temp (!) 97.3 F (36.3 C) (Temporal)   Ht 4\' 11"  (1.499 m)   Wt 154 lb 14.4 oz (70.3 kg)   LMP 03/15/2021 (Exact Date)   SpO2 100%   BMI 31.29 kg/m  General appearance: alert, appears stated age, and mildly obese Head: Normocephalic, without obvious abnormality, atraumatic Eyes: conjunctivae/corneas clear. PERRL, EOM's intact. Fundi benign. Nose: Nares normal. Septum midline. Mucosa normal. No drainage or sinus tenderness. Neck: no adenopathy, no carotid bruit, no JVD, supple, symmetrical, trachea midline, and thyroid not enlarged, symmetric, no tenderness/mass/nodules Lungs: clear to auscultation bilaterally Heart: Regular rate and rhythm, normal S1 and S2, 2/6 systolic murmur, no rubs/gallops, non-displaced PMI, Abdomen: soft, non-tender; bowel sounds normal; no masses,  no organomegaly Extremities: extremities normal, atraumatic, no cyanosis or edema Skin: Skin color, texture, turgor normal. No rashes or lesions or erythema - abdomen   Assessment:  Mackenzie Patterson was seen today for rash.  Diagnoses and all orders for this visit:  Type 2 diabetes mellitus without complication, without long-term current use of insulin (HCC) -     HgB A1c 5.4 .Well controlled. Patient would not like to stop medication . She is aware of s/s of  hypoglycemia  Continue metformin XR 500mg  daily .  Allergic urticaria -     hydrOXYzine (VISTARIL) 25 MG capsule; Take 1 capsule (25 mg total) by mouth every 8 (eight) hours as needed. -     Discontinue: triamcinolone cream (KENALOG) 0.1 %; APPLY TO AFFECTED AREA TWICE A DAY  Essential hypertension Counseled on blood pressure goal of less than 130/80, low-sodium, DASH diet, medication compliance, 150 minutes of moderate intensity exercise per week. Discussed medication compliance, adverse effects.  Followed by cardiology  -     amLODipine (NORVASC) 5 MG tablet; Take 1 tablet (5 mg total) by mouth daily.  Eczema, unspecified type -     triamcinolone 0.1%-Cetaphil equivalent 1:1 cream mixture; Apply topically 3 (three) times daily as needed.  Other orders -     metFORMIN (GLUCOPHAGE XR) 500 MG 24 hr tablet; Take 1 tablet (500 mg total) by mouth daily with breakfast. -     ibuprofen (ADVIL) 800 MG tablet; TAKE 1 TABLET BY MOUTH EVERY 8 HOURS AS NEEDED FOR MODERATE PAIN.    This note has been created with Surveyor, quantity. Any transcriptional errors are unintentional.  Kerin Perna Np

## 2021-03-25 ENCOUNTER — Other Ambulatory Visit (INDEPENDENT_AMBULATORY_CARE_PROVIDER_SITE_OTHER): Payer: Self-pay | Admitting: Primary Care

## 2021-03-25 DIAGNOSIS — J309 Allergic rhinitis, unspecified: Secondary | ICD-10-CM

## 2021-03-25 DIAGNOSIS — L309 Dermatitis, unspecified: Secondary | ICD-10-CM

## 2021-03-25 NOTE — Telephone Encounter (Signed)
Please see pharmacy note-requesting clarification.      Requested Prescriptions  Pending Prescriptions Disp Refills   triamcinolone cream (KENALOG) 0.1 % [Pharmacy Med Name: MOSITURE/STEROID]  1    Sig: APPLY TOPICALLY 3 TIMES DAILY AS NEEDED.     Dermatology:  Corticosteroids Passed - 03/25/2021  7:49 AM      Passed - Valid encounter within last 12 months    Recent Outpatient Visits           Yesterday Type 2 diabetes mellitus without complication, without long-term current use of insulin ([place])   [place] [facility] Juluis Mire P, NP   3 months ago Nostril sore   [place] Juluis Mire P, NP   8 months ago Rash and nonspecific skin eruption   [place] Juluis Mire P, NP   12 months ago Type 2 diabetes mellitus without complication, without long-term current use of insulin ([place])   [place], [place] P, NP   1 year ago Type 2 diabetes mellitus without complication, without long-term current use of insulin ([place])   [place], [place], NP       Future Appointments             In 1 month Bary Castilla, NP [facility]

## 2021-03-25 NOTE — Telephone Encounter (Signed)
Per pharmacy please specify ratio.

## 2021-03-27 ENCOUNTER — Other Ambulatory Visit (INDEPENDENT_AMBULATORY_CARE_PROVIDER_SITE_OTHER): Payer: Self-pay | Admitting: Primary Care

## 2021-04-15 DIAGNOSIS — J4530 Mild persistent asthma, uncomplicated: Secondary | ICD-10-CM | POA: Diagnosis not present

## 2021-04-15 DIAGNOSIS — E119 Type 2 diabetes mellitus without complications: Secondary | ICD-10-CM | POA: Diagnosis not present

## 2021-04-15 DIAGNOSIS — R010 Benign and innocent cardiac murmurs: Secondary | ICD-10-CM | POA: Diagnosis not present

## 2021-04-15 DIAGNOSIS — R21 Rash and other nonspecific skin eruption: Secondary | ICD-10-CM | POA: Diagnosis not present

## 2021-04-15 DIAGNOSIS — Z0001 Encounter for general adult medical examination with abnormal findings: Secondary | ICD-10-CM | POA: Diagnosis not present

## 2021-04-15 DIAGNOSIS — R0789 Other chest pain: Secondary | ICD-10-CM | POA: Diagnosis not present

## 2021-04-19 ENCOUNTER — Other Ambulatory Visit: Payer: Self-pay | Admitting: Internal Medicine

## 2021-04-19 DIAGNOSIS — Z1231 Encounter for screening mammogram for malignant neoplasm of breast: Secondary | ICD-10-CM

## 2021-04-28 ENCOUNTER — Ambulatory Visit: Payer: Self-pay | Admitting: Nurse Practitioner

## 2021-04-30 ENCOUNTER — Other Ambulatory Visit (INDEPENDENT_AMBULATORY_CARE_PROVIDER_SITE_OTHER): Payer: Self-pay | Admitting: Primary Care

## 2021-04-30 DIAGNOSIS — L309 Dermatitis, unspecified: Secondary | ICD-10-CM

## 2021-05-01 NOTE — Telephone Encounter (Signed)
Requested Prescriptions  Pending Prescriptions Disp Refills  . triamcinolone cream (KENALOG) 0.1 % [Pharmacy Med Name: TRIAMCINOLONE 0.1% CREAM] 454 g 1    Sig: APPLY TO AFFECTED AREA TWICE A DAY     Dermatology:  Corticosteroids Passed - 04/30/2021  5:47 PM      Passed - Valid encounter within last 12 months    Recent Outpatient Visits          1 month ago Type 2 diabetes mellitus without complication, without long-term current use of insulin ([place])   [place] [facility] Kerin Perna, NP   4 months ago Nostril sore   [place] Juluis Mire P, NP   9 months ago Rash and nonspecific skin eruption   [place] Juluis Mire P, NP   1 year ago Type 2 diabetes mellitus without complication, without long-term current use of insulin ([place])   [place] [facility] Juluis Mire P, NP   1 year ago Type 2 diabetes mellitus without complication, without long-term current use of insulin ([place])   [place] [facility] Kerin Perna, NP      Future Appointments            In 5 days Kerin Perna, NP [place]

## 2021-05-06 ENCOUNTER — Other Ambulatory Visit: Payer: Self-pay

## 2021-05-06 ENCOUNTER — Encounter (INDEPENDENT_AMBULATORY_CARE_PROVIDER_SITE_OTHER): Payer: Self-pay | Admitting: Primary Care

## 2021-05-06 ENCOUNTER — Other Ambulatory Visit (HOSPITAL_COMMUNITY)
Admission: RE | Admit: 2021-05-06 | Discharge: 2021-05-06 | Disposition: A | Discharge status: Home / Self Care | Payer: Medicaid Other | Source: Ambulatory Visit | Attending: Primary Care | Admitting: Primary Care

## 2021-05-06 ENCOUNTER — Ambulatory Visit (INDEPENDENT_AMBULATORY_CARE_PROVIDER_SITE_OTHER): Payer: Medicaid Other | Admitting: Primary Care

## 2021-05-06 VITALS — BP 118/62 | HR 85 | Temp 97.7°F | Ht 59.0 in | Wt 152.2 lb

## 2021-05-06 DIAGNOSIS — N898 Other specified noninflammatory disorders of vagina: Secondary | ICD-10-CM | POA: Insufficient documentation

## 2021-05-06 DIAGNOSIS — J309 Allergic rhinitis, unspecified: Secondary | ICD-10-CM | POA: Diagnosis not present

## 2021-05-06 DIAGNOSIS — L500 Allergic urticaria: Secondary | ICD-10-CM | POA: Diagnosis not present

## 2021-05-06 MED ORDER — HYDROXYZINE PAMOATE 25 MG PO CAPS: 25.0000 mg | ORAL_CAPSULE | Freq: Three times a day (TID) | ORAL | 1 refills | Status: DC | PRN

## 2021-05-06 MED ORDER — AMMONIUM LACTATE 12 % EX LOTN: 1.0000 "application " | TOPICAL_LOTION | CUTANEOUS | 1 refills | Status: DC | PRN

## 2021-05-06 MED ORDER — CETIRIZINE HCL 10 MG PO TABS: 10.0000 mg | ORAL_TABLET | Freq: Every day | ORAL | 11 refills | Status: AC

## 2021-05-06 NOTE — Patient Instructions (Signed)
Alternative Vaginitis Therapies  1) soak in tub of warm water waist high with 1/2 cup of baking soda in water for ~ 20 mins. 2) soak 3 tampons in 1 tablespoon of fractionated (liquid form) coconut oil with 10 drops of Melaleuca (Tea Tree) essential oil, insert 1 saturated tampon vaginally at bedtime x 3 days. Both options are to be done after sexual intercourse, menses and when suspects Bacterial Vaginosis and/or yeast infection. Please be advised that these alternatives will not replace the need to be evaluated, if symptoms persist. You will need to seek care at an OB/GYN provider.  GO WHITE: Soap: UNSCENTED Dove (white box light green writing) Laundry detergent (underwear)- Dreft or Arm n[employer] Hammer unscented WHITE 100% cotton panties (NOT just cotton crouch) Sanitary napkin/panty liners: UNSCENTED.  If it doesn't SAY unscented it can have a scent/perfume    NO PERFUMES OR LOTIONS OR POTIONS in the vulvar area (may use regular [place]) Condoms: hypoallergenic only. Non dyed (no color) Toilet papers: white only Wash clothes: use a separate wash cloth. WHITE.  Wash in [place].   You can purchase Tea Tree Oil locally at:  [facility] [address] [place], [place] [address] [phone]  [place], [place] [address] [phone]

## 2021-05-08 ENCOUNTER — Other Ambulatory Visit (INDEPENDENT_AMBULATORY_CARE_PROVIDER_SITE_OTHER): Payer: Self-pay | Admitting: Primary Care

## 2021-05-08 DIAGNOSIS — L309 Dermatitis, unspecified: Secondary | ICD-10-CM

## 2021-05-09 NOTE — Telephone Encounter (Signed)
Sent on 05/08/21 to pharmacy, will refuse this duplicate request.  Requested Prescriptions  Refused Prescriptions Disp Refills  . triamcinolone cream (KENALOG) 0.1 % [Pharmacy Med Name: TRIAMCINOLONE 0.1% CREAM] 30 g     Sig: APPLY TO AFFECTED AREA TWICE A DAY     Dermatology:  Corticosteroids Passed - 05/08/2021  4:39 PM      Passed - Valid encounter within last 12 months    Recent Outpatient Visits          3 days ago Allergic urticaria   [place] Juluis Mire P, NP   1 month ago Type 2 diabetes mellitus without complication, without long-term current use of insulin ([place])   [place], Michelle P, NP   5 months ago Nostril sore   [place] Juluis Mire P, NP   10 months ago Rash and nonspecific skin eruption   [place] [facility] Juluis Mire P, NP   1 year ago Type 2 diabetes mellitus without complication, without long-term current use of insulin ([place])   [place] Kerin Perna, NP

## 2021-05-10 ENCOUNTER — Other Ambulatory Visit (INDEPENDENT_AMBULATORY_CARE_PROVIDER_SITE_OTHER): Payer: Self-pay | Admitting: Primary Care

## 2021-05-10 DIAGNOSIS — N760 Acute vaginitis: Secondary | ICD-10-CM

## 2021-05-10 DIAGNOSIS — B379 Candidiasis, unspecified: Secondary | ICD-10-CM

## 2021-05-10 DIAGNOSIS — B9689 Other specified bacterial agents as the cause of diseases classified elsewhere: Secondary | ICD-10-CM

## 2021-05-10 LAB — CERVICOVAGINAL ANCILLARY ONLY
Bacterial Vaginitis (gardnerella): POSITIVE — AB
Candida Glabrata: NEGATIVE
Candida Vaginitis: POSITIVE — AB
Chlamydia: NEGATIVE
Comment: NEGATIVE
Comment: NEGATIVE
Comment: NEGATIVE
Comment: NEGATIVE
Comment: NEGATIVE
Comment: NORMAL
Neisseria Gonorrhea: NEGATIVE
Trichomonas: NEGATIVE

## 2021-05-10 MED ORDER — METRONIDAZOLE 500 MG PO TABS: 500.0000 mg | ORAL_TABLET | Freq: Two times a day (BID) | ORAL | 0 refills | Status: DC

## 2021-05-10 MED ORDER — FLUCONAZOLE 150 MG PO TABS: 150.0000 mg | ORAL_TABLET | Freq: Once | ORAL | 0 refills | Status: AC

## 2021-05-10 NOTE — Progress Notes (Signed)
[facility]   Subjective:   [name] is a 50 y.o. [phone] female complains of an abnormal vaginal discharge for 2 weeks. Discharge described as: white and thick. Vaginal symptoms include local irritation and odor.Vulvar symptoms include discharge described as white, creamy, and malodorous and odor.STI Risk: Very low risk of STD exposure.   Other associated symptoms: local irritation and odor.Menstrual pattern: She had not been having vaginal bleeding . Contraception: none.  She reports no recent antibiotic exposure, denies abdominal pain, diarrhea, and difficulty breathing changes in soaps, detergents coinciding with the onset of her symptoms.  She has not previously self treated or been under treatment by another provider for these symptoms.     Gynecologic History No LMP recorded. Contraception: none Last Pap: 05/10/18. Last mammogram: 05/09/20. Results were: normal  Obstetric History OB History  Gravida Para Term Preterm AB Living  3 2 1 1 1 3   SAB IAB Ectopic Multiple Live Births  0 1 0 1 3    # Outcome Date GA Lbr Len/2nd Weight Sex Delivery Anes PTL Lv  3 IAB 2017          2 Term         LIV  1A Preterm         LIV  1B Preterm         LIV    Past Medical History:  Diagnosis Date   Asthma    Breast discharge 01/10/2017   Congenital ventricular septal defect    Diabetes mellitus without complication (HCC)    Heart murmur    Hypertension    Obesity    Pulmonary hypertension (HCC)    mild    Tricuspid regurgitation    VSD (ventricular septal defect), perimembranous    Qp:Qs 1.7:1 by catheterization     Past Surgical History:  Procedure Laterality Date   CARDIAC CATHETERIZATION [name] 10/30/2014   Procedure: Right Heart Cath;  Surgeon: [name], MD;  Location: [facility];  Service: Cardiovascular;  Laterality: [name];   CHOLECYSTECTOMY  2018   CYST EXCISION     LEFT AND RIGHT HEART CATHETERIZATION WITH CORONARY ANGIOGRAM [name]  09/11/2013   Procedure: LEFT AND RIGHT HEART CATHETERIZATION WITH CORONARY ANGIOGRAM;  Surgeon: [name], MD;  [facility];  Service: Cardiovascular;  Laterality: [name];   RIGHT HEART CATH [name] 09/02/2016   Procedure: Right Heart Cath;  Surgeon: [name], MD;  Location: [place] [facility];  Service: Cardiovascular;  Laterality: [name];   TUBAL LIGATION      Current Outpatient Medications on File Prior to Visit  Medication Sig Dispense Refill   Accu-Chek FastClix Lancets MISC USE 2 TIMES DAILY AS DIRECTED 100 each 11   acyclovir (ZOVIRAX) 400 MG tablet Take 1 tablet (400 mg total) by mouth 2 (two) times daily. 180 tablet 1   albuterol (VENTOLIN HFA) 108 (90 Base) MCG/ACT inhaler Inhale 2 puffs into the lungs every 6 (six) hours as needed for wheezing or shortness of breath. 6.7 g 1   amLODipine (NORVASC) 5 MG tablet Take 1 tablet (5 mg total) by mouth daily. 90 tablet 1   atorvastatin (LIPITOR) 40 MG tablet Take 40 mg by mouth at bedtime.     ferrous sulfate 325 (65 FE) MG tablet Take 1 tablet (325 mg total) by mouth daily with breakfast. 90 tablet 3   fluticasone (FLONASE) 50 MCG/ACT nasal spray SPRAY 2 SPRAYS INTO EACH NOSTRIL EVERY DAY 16  mL 6   glucose blood ([employer] GLUCOSE METER TEST STRIPS) test strip Use as instructed 100 each 12   ibuprofen (ADVIL) 800 MG tablet TAKE 1 TABLET BY MOUTH EVERY 8 HOURS AS NEEDED FOR MODERATE PAIN. 90 tablet 1   JANUMET 50-1000 MG tablet Take 1 tablet by mouth every morning.     SYMBICORT 160-4.5 MCG/ACT inhaler INHALE 2 PUFFS INTO THE LUNGS TWICE A DAY 10.2 each 5   topiramate (TOPAMAX) 25 MG tablet Take 3 tablets (75 mg total) by mouth as needed. 30 tablet 1   triamcinolone cream (KENALOG) 0.1 % APPLY TO AFFECTED AREA TWICE A DAY 454 g 1   No current facility-administered medications on file prior to visit.    Allergies  Allergen Reactions   Penicillins Anaphylaxis    Has patient had a PCN reaction causing immediate rash,  facial/tongue/throat swelling, SOB or lightheadedness with hypotension: Yes Has patient had a PCN reaction causing severe rash involving mucus membranes or skin necrosis: Yes Has patient had a PCN reaction that required hospitalization Yes- went to ED, was not admitted Has patient had a PCN reaction occurring within the last 10 years: No- longer then 10 years If all of the above answers are "NO", then may proceed with Cephalosporin use.    Tape Rash    Other reaction(s): Other (See Comments) Burns skin   Other Rash    Other reaction(s): Other (See Comments) **EKG GEL PADS**  "Burns my skin" Pt states she is allergic to a blood pressure medication, unsure.  **EKG GEL PADS**  "Burns my skin"   Drug Ingredient [Benzonatate] Other (See Comments) and Cough    Makes cough worse   Latex Rash    Social History   Socioeconomic History   Marital status: Single    Spouse name: Not on file   Number of children: Not on file   Years of education: Not on file   Highest education level: Not on file  Occupational History   Not on file  Tobacco Use   Smoking status: Never   Smokeless tobacco: Never  Vaping Use   Vaping Use: Never used  Substance and Sexual Activity   Alcohol use: No   Drug use: No   Sexual activity: Yes    Birth control/protection: Surgical  Other Topics Concern   Not on file  Social History Narrative   Lives with boyfriend. On disability.    Social Determinants of Health   Financial Resource Strain: Not on file  Food Insecurity: Not on file  Transportation Needs: Not on file  Physical Activity: Not on file  Stress: Not on file  Social Connections: Not on file  Intimate Partner Violence: Not on file    Family History  Problem Relation Age of Onset   Cancer Other    Hyperlipidemia Other    Hypertension Other    Asthma Other    Diabetes Mother    Diabetes Father     The following portions of the patient's history were reviewed and updated as  appropriate: allergies, current medications, past family history, past medical history, past social history, past surgical history and problem list.  Review of Systems Pertinent items are noted in HPI.   Objective:  BP 118/62 (BP Location: Left Arm, Patient Position: Sitting, Cuff Size: Normal)   Pulse 85   Temp 97.7 F (36.5 C) (Temporal)   Ht 4\' 11"  (1.499 m)   Wt 152 lb 3.2 oz (69 kg)   SpO2 93%  BMI 30.74 kg/m  CONSTITUTIONAL: Well-developed, well-nourished female in no acute distress.  HENT:  Normocephalic, atraumatic, External right and left ear normal. Oropharynx is clear and moist EYES: Conjunctivae and EOM are normal. Pupils are equal, round, and reactive to light. No scleral icterus.  NECK: Normal range of motion, supple, no masses.  Normal thyroid.  SKIN: Skin is warm and dry. No rash noted. Not diaphoretic. No erythema. No pallor. [place]: Alert and oriented to person, place, and time. Normal reflexes, muscle tone coordination. No cranial nerve deficit noted. PSYCHIATRIC: Normal mood and affect. Normal behavior. Normal judgment and thought content. CARDIOVASCULAR: , normal S1 and S2, 2/6 systolic murmur, no rubs/gallops, non-displaced PMI RESPIRATORY: Clear to auscultation bilaterally. Effort and breath sounds normal, no problems with respiration noted. ABDOMEN: Soft, normal bowel sounds, no distention noted.  No tenderness, rebound or guarding.  PELVIC: anxcillary swab MUSCULOSKELETAL: Normal range of motion. No tenderness.  No cyanosis, clubbing, or edema.  2+ distal pulses.   Assessment:  Prudy was seen today for vaginal discharge.  Diagnoses and all orders for this visit:  Allergic urticaria -     ammonium lactate (LAC-HYDRIN) 12 % lotion; Apply 1 application topically as needed for dry skin. -     hydrOXYzine (VISTARIL) 25 MG capsule; Take 1 capsule (25 mg total) by mouth every 8 (eight) hours as needed. -     cetirizine (ZYRTEC) 10 MG tablet; Take 1 tablet (10  mg total) by mouth daily. At bedtime as needed  Allergic rhinitis, unspecified seasonality, unspecified trigger -     cetirizine (ZYRTEC) 10 MG tablet; Take 1 tablet (10 mg total) by mouth daily. At bedtime as needed  Vaginal discharge -     Cervicovaginal ancillary only   Meds ordered this encounter  Medications   ammonium lactate (LAC-HYDRIN) 12 % lotion    Sig: Apply 1 application topically as needed for dry skin.    Dispense:  400 mL    Refill:  1   hydrOXYzine (VISTARIL) 25 MG capsule    Sig: Take 1 capsule (25 mg total) by mouth every 8 (eight) hours as needed.    Dispense:  30 capsule    Refill:  1   cetirizine (ZYRTEC) 10 MG tablet    Sig: Take 1 tablet (10 mg total) by mouth daily. At bedtime as needed    Dispense:  30 tablet    Refill:  11    Will follow up on all labs ordered today.  This note has been created with Surveyor, quantity. Any transcriptional errors are unintentional.   Kerin Perna, NP 05/10/2021, 3:03 PM

## 2021-05-19 ENCOUNTER — Ambulatory Visit
Admission: RE | Admit: 2021-05-19 | Discharge: 2021-05-19 | Disposition: A | Discharge status: Home / Self Care | Payer: Medicaid Other | Source: Ambulatory Visit | Attending: Internal Medicine | Admitting: Internal Medicine

## 2021-05-19 DIAGNOSIS — Z1231 Encounter for screening mammogram for malignant neoplasm of breast: Secondary | ICD-10-CM

## 2021-06-27 DIAGNOSIS — I639 Cerebral infarction, unspecified: Secondary | ICD-10-CM

## 2021-06-27 HISTORY — DX: Cerebral infarction, unspecified: I63.9

## 2021-07-05 DIAGNOSIS — I272 Pulmonary hypertension, unspecified: Secondary | ICD-10-CM | POA: Diagnosis not present

## 2021-07-05 DIAGNOSIS — Z114 Encounter for screening for human immunodeficiency virus [HIV]: Secondary | ICD-10-CM | POA: Diagnosis not present

## 2021-07-05 DIAGNOSIS — Z1211 Encounter for screening for malignant neoplasm of colon: Secondary | ICD-10-CM | POA: Diagnosis not present

## 2021-07-05 DIAGNOSIS — E119 Type 2 diabetes mellitus without complications: Secondary | ICD-10-CM | POA: Diagnosis not present

## 2021-07-05 DIAGNOSIS — Z1159 Encounter for screening for other viral diseases: Secondary | ICD-10-CM | POA: Diagnosis not present

## 2021-07-05 DIAGNOSIS — R079 Chest pain, unspecified: Secondary | ICD-10-CM | POA: Diagnosis not present

## 2021-07-05 DIAGNOSIS — J4520 Mild intermittent asthma, uncomplicated: Secondary | ICD-10-CM | POA: Diagnosis not present

## 2021-07-05 DIAGNOSIS — Q210 Ventricular septal defect: Secondary | ICD-10-CM | POA: Diagnosis not present

## 2021-07-22 DIAGNOSIS — J4530 Mild persistent asthma, uncomplicated: Secondary | ICD-10-CM | POA: Diagnosis not present

## 2021-07-22 DIAGNOSIS — E119 Type 2 diabetes mellitus without complications: Secondary | ICD-10-CM | POA: Diagnosis not present

## 2021-07-22 DIAGNOSIS — R059 Cough, unspecified: Secondary | ICD-10-CM | POA: Diagnosis not present

## 2021-07-22 DIAGNOSIS — Z20822 Contact with and (suspected) exposure to covid-19: Secondary | ICD-10-CM | POA: Diagnosis not present

## 2021-07-22 DIAGNOSIS — Q210 Ventricular septal defect: Secondary | ICD-10-CM | POA: Diagnosis not present

## 2021-08-27 DIAGNOSIS — D649 Anemia, unspecified: Secondary | ICD-10-CM | POA: Diagnosis not present

## 2021-08-30 ENCOUNTER — Other Ambulatory Visit (INDEPENDENT_AMBULATORY_CARE_PROVIDER_SITE_OTHER): Payer: Self-pay | Admitting: Primary Care

## 2021-09-20 ENCOUNTER — Other Ambulatory Visit (INDEPENDENT_AMBULATORY_CARE_PROVIDER_SITE_OTHER): Payer: Self-pay | Admitting: Primary Care

## 2021-09-20 NOTE — Telephone Encounter (Signed)
Sent to PCP ?

## 2021-09-29 DIAGNOSIS — I10 Essential (primary) hypertension: Secondary | ICD-10-CM | POA: Diagnosis not present

## 2021-09-29 DIAGNOSIS — Q210 Ventricular septal defect: Secondary | ICD-10-CM | POA: Diagnosis not present

## 2021-09-29 DIAGNOSIS — R42 Dizziness and giddiness: Secondary | ICD-10-CM | POA: Diagnosis not present

## 2021-12-17 DIAGNOSIS — D509 Iron deficiency anemia, unspecified: Secondary | ICD-10-CM | POA: Diagnosis not present

## 2021-12-24 DIAGNOSIS — D509 Iron deficiency anemia, unspecified: Secondary | ICD-10-CM | POA: Diagnosis not present

## 2021-12-27 DIAGNOSIS — Q210 Ventricular septal defect: Secondary | ICD-10-CM | POA: Diagnosis not present

## 2021-12-27 DIAGNOSIS — R42 Dizziness and giddiness: Secondary | ICD-10-CM | POA: Diagnosis not present

## 2021-12-27 DIAGNOSIS — I10 Essential (primary) hypertension: Secondary | ICD-10-CM | POA: Diagnosis not present

## 2022-02-05 ENCOUNTER — Other Ambulatory Visit (INDEPENDENT_AMBULATORY_CARE_PROVIDER_SITE_OTHER): Payer: Self-pay | Admitting: Primary Care

## 2022-02-07 NOTE — Telephone Encounter (Signed)
Routed to PCP 

## 2022-02-14 DIAGNOSIS — D649 Anemia, unspecified: Secondary | ICD-10-CM | POA: Diagnosis not present

## 2022-02-14 DIAGNOSIS — E119 Type 2 diabetes mellitus without complications: Secondary | ICD-10-CM | POA: Diagnosis not present

## 2022-02-14 DIAGNOSIS — E611 Iron deficiency: Secondary | ICD-10-CM | POA: Diagnosis not present

## 2022-02-14 DIAGNOSIS — Q210 Ventricular septal defect: Secondary | ICD-10-CM | POA: Diagnosis not present

## 2022-02-22 ENCOUNTER — Encounter: Payer: Self-pay | Admitting: Nurse Practitioner

## 2022-03-01 ENCOUNTER — Ambulatory Visit (INDEPENDENT_AMBULATORY_CARE_PROVIDER_SITE_OTHER): Payer: Medicaid Other | Admitting: Primary Care

## 2022-03-01 ENCOUNTER — Encounter (INDEPENDENT_AMBULATORY_CARE_PROVIDER_SITE_OTHER): Payer: Self-pay | Admitting: Primary Care

## 2022-03-01 VITALS — BP 127/75 | HR 87 | Temp 98.2°F | Ht 59.0 in | Wt 156.6 lb

## 2022-03-01 DIAGNOSIS — Z09 Encounter for follow-up examination after completed treatment for conditions other than malignant neoplasm: Secondary | ICD-10-CM

## 2022-03-01 NOTE — Progress Notes (Signed)
Mackenzie Patterson, is a 51 y.o. female  WER:[id number]  PPJ:[id number]  DOB - [birth year]  Chief Complaint  Patient presents with   Follow-up       Subjective:   Mackenzie Patterson is a 51 y.o. female here today to transfer care after CMA made here aware she will no longer be at [facility]. Patient has No headache, No chest pain, No abdominal pain - No Nausea, No new weakness tingling or numbness, No Cough - shortness of breath  No problems updated.  Allergies  Allergen Reactions   Penicillins Anaphylaxis    Has patient had a PCN reaction causing immediate rash, facial/tongue/throat swelling, SOB or lightheadedness with hypotension: Yes Has patient had a PCN reaction causing severe rash involving mucus membranes or skin necrosis: Yes Has patient had a PCN reaction that required hospitalization Yes- went to ED, was not admitted Has patient had a PCN reaction occurring within the last 10 years: No- longer then 10 years If all of the above answers are "NO", then may proceed with Cephalosporin use.    Tape Rash    Other reaction(s): Other (See Comments) Burns skin   Other Rash    Other reaction(s): Other (See Comments) **EKG GEL PADS**  "Burns my skin" Pt states she is allergic to a blood pressure medication, unsure.  **EKG GEL PADS**  "Burns my skin"   Drug Ingredient [Benzonatate] Other (See Comments) and Cough    Makes cough worse   Latex Rash    Past Medical History:  Diagnosis Date   Asthma    Breast discharge 01/10/2017   Congenital ventricular septal defect    Diabetes mellitus without complication (HCC)    Heart murmur    Hypertension    Obesity    Pulmonary hypertension (HCC)    mild    Tricuspid regurgitation    VSD (ventricular septal defect), perimembranous    Qp:Qs 1.7:1 by catheterization     Current Outpatient Medications on File Prior to Visit  Medication Sig Dispense Refill   Accu-Chek FastClix Lancets MISC USE 2 TIMES DAILY AS  DIRECTED 100 each 11   acyclovir (ZOVIRAX) 400 MG tablet Take 1 tablet (400 mg total) by mouth 2 (two) times daily. 180 tablet 1   albuterol (VENTOLIN HFA) 108 (90 Base) MCG/ACT inhaler Inhale 2 puffs into the lungs every 6 (six) hours as needed for wheezing or shortness of breath. 6.7 g 1   amLODipine (NORVASC) 5 MG tablet Take 1 tablet (5 mg total) by mouth daily. 90 tablet 1   ammonium lactate (LAC-HYDRIN) 12 % lotion Apply 1 application topically as needed for dry skin. 400 mL 1   atorvastatin (LIPITOR) 40 MG tablet Take 40 mg by mouth at bedtime.     cetirizine (ZYRTEC) 10 MG tablet Take 1 tablet (10 mg total) by mouth daily. At bedtime as needed 30 tablet 11   ferrous sulfate 325 (65 FE) MG tablet Take 1 tablet (325 mg total) by mouth daily with breakfast. 90 tablet 3   fluticasone (FLONASE) 50 MCG/ACT nasal spray SPRAY 2 SPRAYS INTO EACH NOSTRIL EVERY DAY 16 mL 6   glucose blood ([employer]S GLUCOSE METER TEST STRIPS) test strip Use as instructed 100 each 12   hydrOXYzine (VISTARIL) 25 MG capsule Take 1 capsule (25 mg total) by mouth every 8 (eight) hours as needed. 30 capsule 1   ibuprofen (ADVIL) 800 MG tablet TAKE 1 TABLET BY MOUTH EVERY 8 HOURS AS NEEDED FOR MODERATE PAIN. 90 tablet  1   JANUMET 50-1000 MG tablet TAKE 1 TABLET DAILY AFTER BREAKFAST 90 tablet 1   metroNIDAZOLE (FLAGYL) 500 MG tablet Take 1 tablet (500 mg total) by mouth 2 (two) times daily. 14 tablet 0   SYMBICORT 160-4.5 MCG/ACT inhaler INHALE 2 PUFFS INTO THE LUNGS TWICE A DAY 10.2 each 5   topiramate (TOPAMAX) 25 MG tablet Take 3 tablets (75 mg total) by mouth as needed. 30 tablet 1   triamcinolone cream (KENALOG) 0.1 % APPLY TO AFFECTED AREA TWICE A DAY 454 g 1   No current facility-administered medications on file prior to visit.    Objective:   Vitals:   03/01/22 0957  BP: 127/75  Pulse: 87  Temp: 98.2 F (36.8 C)  TempSrc: Oral  SpO2: 94%  Weight: 156 lb 9.6 oz (71 kg)  Height: '4\' 11"'$  (1.499 m)     Exam General appearance : Awake, alert, not in any distress. Speech Clear. Not toxic looking HEENT: Atraumatic and Normocephalic, pupils equally reactive to light and accomodation Neck: Supple, no JVD. No cervical lymphadenopathy.  Chest: Good air entry bilaterally, no added sounds  CVS: S1 S2 regular, no murmurs.  Abdomen: Bowel sounds present, Non tender and not distended with no gaurding, rigidity or rebound. Extremities: B/L Lower Ext shows no edema, both legs are warm to touch Neurology: Awake alert, and oriented X 3, Non focal Skin: No Rash  Data Review Lab Results  Component Value Date   HGBA1C 5.4 03/24/2021   HGBA1C 5.3 03/27/2020   HGBA1C 5.9 (H) 08/04/2019    Assessment & Plan   Mackenzie Patterson was seen today for follow-up.  Diagnoses and all orders for this visit:  Follow-up exam Patient in today to sign medical release form to transfer care.      Patient have been counseled extensively about nutrition and exercise. Other issues discussed during this visit include: low cholesterol diet, weight control and daily exercise, foot care, annual eye examinations at Ophthal[facility], importance of adherence with medications and regular follow-up. We also discussed long term complications of uncontrolled diabetes and hypertension.   No follow-ups on file.  The patient was given clear instructions to go to ER or return to medical center if symptoms don't improve, worsen or new problems develop. The patient verbalized understanding. The patient was told to call to get lab results if they haven't heard anything in the next week.   This note has been created with Surveyor, quantity. Any transcriptional errors are unintentional.   Kerin Perna, NP 03/01/2022, 4:38 PM

## 2022-03-05 ENCOUNTER — Other Ambulatory Visit (INDEPENDENT_AMBULATORY_CARE_PROVIDER_SITE_OTHER): Payer: Self-pay | Admitting: Primary Care

## 2022-03-05 DIAGNOSIS — I10 Essential (primary) hypertension: Secondary | ICD-10-CM

## 2022-03-25 ENCOUNTER — Ambulatory Visit: Payer: Medicaid Other | Admitting: Nurse Practitioner

## 2022-04-07 DIAGNOSIS — E119 Type 2 diabetes mellitus without complications: Secondary | ICD-10-CM | POA: Diagnosis not present

## 2022-04-07 DIAGNOSIS — Q210 Ventricular septal defect: Secondary | ICD-10-CM | POA: Diagnosis not present

## 2022-04-07 DIAGNOSIS — M7660 Achilles tendinitis, unspecified leg: Secondary | ICD-10-CM | POA: Diagnosis not present

## 2022-04-22 DIAGNOSIS — M7731 Calcaneal spur, right foot: Secondary | ICD-10-CM | POA: Diagnosis not present

## 2022-04-22 DIAGNOSIS — M7732 Calcaneal spur, left foot: Secondary | ICD-10-CM | POA: Diagnosis not present

## 2022-04-22 DIAGNOSIS — M6701 Short Achilles tendon (acquired), right ankle: Secondary | ICD-10-CM | POA: Diagnosis not present

## 2022-04-22 DIAGNOSIS — M2011 Hallux valgus (acquired), right foot: Secondary | ICD-10-CM | POA: Diagnosis not present

## 2022-04-22 DIAGNOSIS — M2012 Hallux valgus (acquired), left foot: Secondary | ICD-10-CM | POA: Diagnosis not present

## 2022-04-22 DIAGNOSIS — M6702 Short Achilles tendon (acquired), left ankle: Secondary | ICD-10-CM | POA: Diagnosis not present

## 2022-04-27 ENCOUNTER — Ambulatory Visit: Payer: Medicaid Other | Admitting: Obstetrics

## 2022-04-27 ENCOUNTER — Encounter: Payer: Self-pay | Admitting: Obstetrics

## 2022-04-27 ENCOUNTER — Other Ambulatory Visit: Payer: Self-pay | Admitting: Internal Medicine

## 2022-04-27 ENCOUNTER — Other Ambulatory Visit (HOSPITAL_COMMUNITY)
Admission: RE | Admit: 2022-04-27 | Discharge: 2022-04-27 | Disposition: A | Discharge status: Home / Self Care | Payer: Medicaid Other | Source: Ambulatory Visit | Attending: Obstetrics | Admitting: Obstetrics

## 2022-04-27 VITALS — BP 107/73 | HR 83 | Ht 59.0 in | Wt 154.0 lb

## 2022-04-27 DIAGNOSIS — B009 Herpesviral infection, unspecified: Secondary | ICD-10-CM | POA: Diagnosis not present

## 2022-04-27 DIAGNOSIS — Z01419 Encounter for gynecological examination (general) (routine) without abnormal findings: Secondary | ICD-10-CM | POA: Diagnosis not present

## 2022-04-27 DIAGNOSIS — N939 Abnormal uterine and vaginal bleeding, unspecified: Secondary | ICD-10-CM

## 2022-04-27 DIAGNOSIS — N898 Other specified noninflammatory disorders of vagina: Secondary | ICD-10-CM

## 2022-04-27 MED ORDER — ACYCLOVIR 400 MG PO TABS: 400.0000 mg | ORAL_TABLET | Freq: Two times a day (BID) | ORAL | 2 refills | Status: AC

## 2022-04-27 MED ORDER — IBUPROFEN 800 MG PO TABS: 800.0000 mg | ORAL_TABLET | Freq: Three times a day (TID) | ORAL | 5 refills | Status: DC | PRN

## 2022-04-27 NOTE — Progress Notes (Addendum)
51 y.o GYN presents for AEX/PAP.  C/o AUB, she is having her periods 3-4/months, pelvic pain 9/10 x 2+ years.  Mammogram is scheduled for 05/10/2022. Last PAP 05/10/2018

## 2022-04-27 NOTE — Progress Notes (Signed)
Subjective:        Mackenzie Patterson is a 50 y.o. female here for a routine exam.  Current complaints: Having heavy periods 3-4 times a month for the past year.  Also c/o vaginal discharge.  Personal health questionnaire:  Is patient Ashkenazi Jewish, have a family history of breast and/or ovarian cancer: no Is there a family history of uterine cancer diagnosed at age < 46, gastrointestinal cancer, urinary tract cancer, family member who is a Field seismologist syndrome-associated carrier: no Is the patient overweight and hypertensive, family history of diabetes, personal history of gestational diabetes, preeclampsia or PCOS: yes Is patient over 32, have PCOS,  family history of premature CHD under age 1, diabetes, smoke, have hypertension or peripheral artery disease:  no At any time, has a partner hit, kicked or otherwise hurt or frightened you?: no Over the past 2 weeks, have you felt down, depressed or hopeless?: no Over the past 2 weeks, have you felt little interest or pleasure in doing things?:no   Gynecologic History Patient's last menstrual period was 04/07/2022 (exact date). Contraception: tubal ligation Last Pap: 2019. Results were: normal Last mammogram: 2022. Results were: normal  Obstetric History OB History  Gravida Para Term Preterm AB Living  '3 2 1 1 1 3  '$ SAB IAB Ectopic Multiple Live Births  0 1 0 1 3    # Outcome Date GA Lbr Len/2nd Weight Sex Delivery Anes PTL Lv  3 IAB 2017          2 Term         LIV  1A Preterm         LIV  1B Preterm         LIV    Past Medical History:  Diagnosis Date   Asthma    Breast discharge 01/10/2017   Congenital ventricular septal defect    Diabetes mellitus without complication (HCC)    Heart murmur    Hypertension    Obesity    Pulmonary hypertension (HCC)    mild    Tricuspid regurgitation    VSD (ventricular septal defect), perimembranous    Qp:Qs 1.7:1 by catheterization     Past Surgical History:  Procedure Laterality Date    CARDIAC CATHETERIZATION [name] 10/30/2014   Procedure: Right Heart Cath;  Surgeon: [name], MD;  Location: [facility];  Service: Cardiovascular;  Laterality: [name];   CHOLECYSTECTOMY  2018   CYST EXCISION     LEFT AND RIGHT HEART CATHETERIZATION WITH CORONARY ANGIOGRAM [name] 09/11/2013   Procedure: LEFT AND RIGHT HEART CATHETERIZATION WITH CORONARY ANGIOGRAM;  Surgeon: [name], MD;  Location: [facility];  Service: Cardiovascular;  Laterality: [name];   RIGHT HEART CATH [name] 09/02/2016   Procedure: Right Heart Cath;  Surgeon: [name], MD;  Location: [facility];  Service: Cardiovascular;  Laterality: [name];   TUBAL LIGATION       Current Outpatient Medications:    ibuprofen (ADVIL) 800 MG tablet, Take 1 tablet (800 mg total) by mouth every 8 (eight) hours as needed., Disp: 30 tablet, Rfl: 5   Accu-Chek FastClix Lancets MISC, USE 2 TIMES DAILY AS DIRECTED, Disp: 100 each, Rfl: 11   acyclovir (ZOVIRAX) 400 MG tablet, Take 1 tablet (400 mg total) by mouth 2 (two) times daily., Disp: 180 tablet, Rfl: 2   albuterol (VENTOLIN HFA) 108 (90 Base) MCG/ACT inhaler, Inhale 2 puffs into the lungs every 6 (six) hours as needed for wheezing  or shortness of breath., Disp: 6.7 g, Rfl: 1   amLODipine (NORVASC) 5 MG tablet, Take 1 tablet (5 mg total) by mouth daily., Disp: 90 tablet, Rfl: 1   ammonium lactate (LAC-HYDRIN) 12 % lotion, Apply 1 application topically as needed for dry skin., Disp: 400 mL, Rfl: 1   atorvastatin (LIPITOR) 40 MG tablet, Take 40 mg by mouth at bedtime., Disp: , Rfl:    cetirizine (ZYRTEC) 10 MG tablet, Take 1 tablet (10 mg total) by mouth daily. At bedtime as needed, Disp: 30 tablet, Rfl: 11   ferrous sulfate 325 (65 FE) MG tablet, Take 1 tablet (325 mg total) by mouth daily with breakfast., Disp: 90 tablet, Rfl: 3   fluticasone (FLONASE) 50 MCG/ACT nasal spray, SPRAY 2 SPRAYS INTO EACH NOSTRIL EVERY DAY, Disp: 16 mL, Rfl: 6   glucose blood (CVS GLUCOSE  METER TEST STRIPS) test strip, Use as instructed, Disp: 100 each, Rfl: 12   hydrOXYzine (VISTARIL) 25 MG capsule, Take 1 capsule (25 mg total) by mouth every 8 (eight) hours as needed., Disp: 30 capsule, Rfl: 1   ibuprofen (ADVIL) 800 MG tablet, TAKE 1 TABLET BY MOUTH EVERY 8 HOURS AS NEEDED FOR MODERATE PAIN., Disp: 90 tablet, Rfl: 1   JANUMET 50-1000 MG tablet, TAKE 1 TABLET DAILY AFTER BREAKFAST, Disp: 90 tablet, Rfl: 1   metroNIDAZOLE (FLAGYL) 500 MG tablet, Take 1 tablet (500 mg total) by mouth 2 (two) times daily. (Patient not taking: Reported on 04/27/2022), Disp: 14 tablet, Rfl: 0   SYMBICORT 160-4.5 MCG/ACT inhaler, INHALE 2 PUFFS INTO THE LUNGS TWICE A DAY, Disp: 10.2 each, Rfl: 5   topiramate (TOPAMAX) 25 MG tablet, Take 3 tablets (75 mg total) by mouth as needed., Disp: 30 tablet, Rfl: 1   triamcinolone cream (KENALOG) 0.1 %, APPLY TO AFFECTED AREA TWICE A DAY, Disp: 454 g, Rfl: 1 Allergies  Allergen Reactions   Penicillins Anaphylaxis    Has patient had a PCN reaction causing immediate rash, facial/tongue/throat swelling, SOB or lightheadedness with hypotension: Yes Has patient had a PCN reaction causing severe rash involving mucus membranes or skin necrosis: Yes Has patient had a PCN reaction that required hospitalization Yes- went to ED, was not admitted Has patient had a PCN reaction occurring within the last 10 years: No- longer then 10 years If all of the above answers are "NO", then may proceed with Cephalosporin use.    Tape Rash    Other reaction(s): Other (See Comments) Burns skin   Other Rash    Other reaction(s): Other (See Comments) **EKG GEL PADS**  "Burns my skin" Pt states she is allergic to a blood pressure medication, unsure.  **EKG GEL PADS**  "Burns my skin"   Drug Ingredient [Benzonatate] Other (See Comments) and Cough    Makes cough worse   Latex Rash    Social History   Tobacco Use   Smoking status: Never   Smokeless tobacco: Never  Substance Use  Topics   Alcohol use: No    Family History  Problem Relation Age of Onset   Cancer Other    Hyperlipidemia Other    Hypertension Other    Asthma Other    Diabetes Mother    Diabetes Father       Review of Systems  Constitutional: negative for fatigue and weight loss Respiratory: negative for cough and wheezing Cardiovascular: negative for chest pain, fatigue and palpitations Gastrointestinal: negative for abdominal pain and change in bowel habits Musculoskeletal:negative for myalgias Neurological: negative for  gait problems and tremors Behavioral/Psych: negative for abusive relationship, depression Endocrine: negative for temperature intolerance    Genitourinary:positive for abnormal menstrual periods, vaginal discharge and hot flashes.  negative for genital lesions, sexual problems  Integument/breast: negative for breast lump, breast tenderness, nipple discharge and skin lesion(s)    Objective:       BP 107/73   Pulse 83   Ht '4\' 11"'$  (1.499 m)   Wt 154 lb (69.9 kg)   LMP 04/07/2022 (Exact Date)   BMI 31.10 kg/m  General:   Alert and no distress  Skin:   no rash or abnormalities  Lungs:   clear to auscultation bilaterally  Heart:   regular rate and rhythm, S1, S2 normal, no murmur, click, rub or gallop  Breasts:   normal without suspicious masses, skin or nipple changes or axillary nodes  Abdomen:  normal findings: no organomegaly, soft, non-tender and no hernia  Pelvis:  External genitalia: normal general appearance Urinary system: urethral meatus normal and bladder without fullness, nontender Vaginal: normal without tenderness, induration or masses Cervix: normal appearance Adnexa: normal bimanual exam Uterus: anteverted and non-tender, normal size   Lab Review Urine pregnancy test Labs reviewed yes Radiologic studies reviewed yes  I have spent a total of 30 minutes of face-to-face time, excluding clinical staff time, reviewing notes and preparing to see  patient, ordering tests and/or medications, and counseling the patient.   Assessment:    1. Encounter for gynecological examination with Papanicolaou smear of cervix Rx: - Cytology - PAP( [place])  2. Abnormal uterine bleeding (AUB) Rx: - US PELVIC COMPLETE WITH TRANSVAGINAL; Future  3. Vaginal discharge Rx: - Cervicovaginal ancillary only( [place])  4. Herpes Rx: - acyclovir (ZOVIRAX) 400 MG tablet; Take 1 tablet (400 mg total) by mouth 2 (two) times daily.  Dispense: 180 tablet; Refill: 2     Plan:    Education reviewed: calcium supplements, depression evaluation, low fat, low cholesterol diet, safe sex/STD prevention, self breast exams, and weight bearing exercise. Follow up in: 2 weeks.   Meds ordered this encounter  Medications   acyclovir (ZOVIRAX) 400 MG tablet    Sig: Take 1 tablet (400 mg total) by mouth 2 (two) times daily.    Dispense:  180 tablet    Refill:  2   ibuprofen (ADVIL) 800 MG tablet    Sig: Take 1 tablet (800 mg total) by mouth every 8 (eight) hours as needed.    Dispense:  30 tablet    Refill:  5   Orders Placed This Encounter  Procedures   US PELVIC COMPLETE WITH TRANSVAGINAL    Standing Status:   Future    Standing Expiration Date:   04/28/2023    Order Specific Question:   Reason for Exam (SYMPTOM  OR DIAGNOSIS REQUIRED)    Answer:   AUB    Order Specific Question:   Preferred imaging location?    Answer:   [facility]     Shelly Bombard, MD 04/27/2022 9:40 AM

## 2022-04-28 ENCOUNTER — Other Ambulatory Visit: Payer: Self-pay | Admitting: Internal Medicine

## 2022-04-28 DIAGNOSIS — Z1231 Encounter for screening mammogram for malignant neoplasm of breast: Secondary | ICD-10-CM

## 2022-04-28 LAB — CERVICOVAGINAL ANCILLARY ONLY
Bacterial Vaginitis (gardnerella): POSITIVE — AB
Candida Glabrata: NEGATIVE
Candida Vaginitis: NEGATIVE
Chlamydia: NEGATIVE
Comment: NEGATIVE
Comment: NEGATIVE
Comment: NEGATIVE
Comment: NEGATIVE
Comment: NEGATIVE
Comment: NORMAL
Neisseria Gonorrhea: NEGATIVE
Trichomonas: NEGATIVE

## 2022-04-29 ENCOUNTER — Other Ambulatory Visit: Payer: Self-pay | Admitting: Obstetrics

## 2022-04-29 DIAGNOSIS — B9689 Other specified bacterial agents as the cause of diseases classified elsewhere: Secondary | ICD-10-CM

## 2022-04-29 MED ORDER — METRONIDAZOLE 500 MG PO TABS: 500.0000 mg | ORAL_TABLET | Freq: Two times a day (BID) | ORAL | 0 refills | Status: DC

## 2022-05-02 ENCOUNTER — Telehealth: Payer: Self-pay | Admitting: Emergency Medicine

## 2022-05-02 LAB — CYTOLOGY - PAP
Comment: NEGATIVE
Diagnosis: UNDETERMINED — AB
High risk HPV: NEGATIVE

## 2022-05-02 NOTE — Telephone Encounter (Signed)
TC to patient to discuss results

## 2022-05-04 ENCOUNTER — Ambulatory Visit (HOSPITAL_BASED_OUTPATIENT_CLINIC_OR_DEPARTMENT_OTHER)
Admission: RE | Admit: 2022-05-04 | Discharge: 2022-05-04 | Disposition: A | Discharge status: Home / Self Care | Payer: Medicaid Other | Source: Ambulatory Visit | Attending: Obstetrics | Admitting: Obstetrics

## 2022-05-04 DIAGNOSIS — N939 Abnormal uterine and vaginal bleeding, unspecified: Secondary | ICD-10-CM | POA: Insufficient documentation

## 2022-05-04 DIAGNOSIS — D252 Subserosal leiomyoma of uterus: Secondary | ICD-10-CM | POA: Diagnosis not present

## 2022-05-21 ENCOUNTER — Other Ambulatory Visit: Payer: Self-pay

## 2022-05-21 ENCOUNTER — Encounter (HOSPITAL_BASED_OUTPATIENT_CLINIC_OR_DEPARTMENT_OTHER): Payer: Self-pay | Admitting: Emergency Medicine

## 2022-05-21 ENCOUNTER — Emergency Department (HOSPITAL_BASED_OUTPATIENT_CLINIC_OR_DEPARTMENT_OTHER)
Admission: EM | Admit: 2022-05-21 | Discharge: 2022-05-21 | Disposition: A | Discharge status: Home / Self Care | Payer: Medicaid Other | Attending: Emergency Medicine | Admitting: Emergency Medicine

## 2022-05-21 DIAGNOSIS — E119 Type 2 diabetes mellitus without complications: Secondary | ICD-10-CM | POA: Diagnosis not present

## 2022-05-21 DIAGNOSIS — Z79899 Other long term (current) drug therapy: Secondary | ICD-10-CM | POA: Diagnosis not present

## 2022-05-21 DIAGNOSIS — Z91040 Latex allergy status: Secondary | ICD-10-CM | POA: Insufficient documentation

## 2022-05-21 DIAGNOSIS — I10 Essential (primary) hypertension: Secondary | ICD-10-CM | POA: Insufficient documentation

## 2022-05-21 DIAGNOSIS — K1370 Unspecified lesions of oral mucosa: Secondary | ICD-10-CM | POA: Diagnosis not present

## 2022-05-21 DIAGNOSIS — Z7984 Long term (current) use of oral hypoglycemic drugs: Secondary | ICD-10-CM | POA: Diagnosis not present

## 2022-05-21 MED ORDER — VALACYCLOVIR HCL 1 G PO TABS: 1000.0000 mg | ORAL_TABLET | Freq: Three times a day (TID) | ORAL | 0 refills | Status: DC

## 2022-05-21 MED ORDER — NYSTATIN 100000 UNIT/ML MT SUSP: 5.0000 mL | Freq: Three times a day (TID) | OROMUCOSAL | 0 refills | Status: DC | PRN

## 2022-05-21 NOTE — ED Triage Notes (Signed)
Pt has lesions on her upper inside and bottom lip, red. States she cannot eat so sore. Has been there for 2 weeks and is getting worse. No fever.

## 2022-05-21 NOTE — ED Notes (Signed)
Dc instructions reviewed with patient. Patient voiced understanding. Dc with belongings.  °

## 2022-05-21 NOTE — Discharge Instructions (Addendum)
Your lesions are consistent with potential HSV or herpes.  Additionally they could be due to an autoimmune disease such as Behcet's disease.  You may require further outpatient work-up to further evaluate if you have continued recurrent outbreaks of these lesions.  Recommend trying the Magic mouthwash for pain, Valtrex for the lesions.  Follow-up with your PCP in 1 week to 10 days to ensure resolution.

## 2022-05-21 NOTE — ED Provider Notes (Signed)
[facility] EMERGENCY DEPT Provider Note   CSN: [id number] Arrival date & time: 05/21/22  9323     History  Chief Complaint  Patient presents with   Mouth Lesions    Mackenzie Patterson is a 51 y.o. female.   Mouth Lesions    51 year old female with medical history significant for obesity, DM 2, hypertension, previously prescribed acyclovir for HSV who presents to the emergency department with mouth sores.  The patient states that she has had sores in mouth lesions on the upper inside and bottom lip.  She states that her she has been having difficulty eating due to the sores.  She states that they have been there for over a week and a half.  She denies any fevers or headaches.  She is tolerating her secretions.  She has not currently on any antiviral medication.  Home Medications Prior to Admission medications   Medication Sig Start Date End Date Taking? Authorizing Provider  magic mouthwash (nystatin, lidocaine, diphenhydrAMINE, alum & mag hydroxide) suspension Swish and spit 5 mLs 3 (three) times daily as needed for mouth pain. 05/21/22  Yes Regan Lemming, MD  valACYclovir (VALTREX) 1000 MG tablet Take 1 tablet (1,000 mg total) by mouth 3 (three) times daily. 05/21/22  Yes Regan Lemming, MD  Accu-Chek FastClix Lancets MISC USE 2 TIMES DAILY AS DIRECTED 07/26/19   Kerin Perna, NP  acyclovir (ZOVIRAX) 400 MG tablet Take 1 tablet (400 mg total) by mouth 2 (two) times daily. 04/27/22   Shelly Bombard, MD  albuterol (VENTOLIN HFA) 108 (90 Base) MCG/ACT inhaler Inhale 2 puffs into the lungs every 6 (six) hours as needed for wheezing or shortness of breath. 01/27/20   Kerin Perna, NP  amLODipine (NORVASC) 5 MG tablet Take 1 tablet (5 mg total) by mouth daily. 03/24/21   Kerin Perna, NP  ammonium lactate (LAC-HYDRIN) 12 % lotion Apply 1 application topically as needed for dry skin. 05/06/21   Kerin Perna, NP  atorvastatin (LIPITOR) 40 MG tablet Take  40 mg by mouth at bedtime. 02/14/21   [provider]  cetirizine (ZYRTEC) 10 MG tablet Take 1 tablet (10 mg total) by mouth daily. At bedtime as needed 05/06/21   Kerin Perna, NP  ferrous sulfate 325 (65 FE) MG tablet Take 1 tablet (325 mg total) by mouth daily with breakfast. 01/27/20   Kerin Perna, NP  fluticasone [facility]) 50 MCG/ACT nasal spray SPRAY 2 SPRAYS INTO EACH NOSTRIL EVERY DAY 03/25/21   Kerin Perna, NP  glucose blood (CVS GLUCOSE METER TEST STRIPS) test strip Use as instructed 07/30/19   Kerin Perna, NP  hydrOXYzine (VISTARIL) 25 MG capsule Take 1 capsule (25 mg total) by mouth every 8 (eight) hours as needed. 05/06/21   Kerin Perna, NP  ibuprofen (ADVIL) 800 MG tablet TAKE 1 TABLET BY MOUTH EVERY 8 HOURS AS NEEDED FOR MODERATE PAIN. 02/07/22   Kerin Perna, NP  ibuprofen (ADVIL) 800 MG tablet Take 1 tablet (800 mg total) by mouth every 8 (eight) hours as needed. 04/27/22   Shelly Bombard, MD  JANUMET 50-1000 MG tablet TAKE 1 TABLET DAILY AFTER BREAKFAST 08/30/21   Kerin Perna, NP  metroNIDAZOLE (FLAGYL) 500 MG tablet Take 1 tablet (500 mg total) by mouth 2 (two) times daily. 04/29/22   Shelly Bombard, MD  SYMBICORT 160-4.5 MCG/ACT inhaler INHALE 2 PUFFS INTO THE LUNGS TWICE A DAY 03/09/21   Kerin Perna, NP  topiramate (TOPAMAX) 25 MG tablet Take 3 tablets (75 mg total) by mouth as needed. 01/27/20   Kerin Perna, NP  triamcinolone cream (KENALOG) 0.1 % APPLY TO AFFECTED AREA TWICE A DAY 05/01/21   Kerin Perna, NP      Allergies    Penicillins, Tape, Other, Drug ingredient [benzonatate], and Latex    Review of Systems   Review of Systems  HENT:  Positive for mouth sores.   All other systems reviewed and are negative.   Physical Exam Updated Vital Signs BP 116/72   Pulse 74   Temp 98.3 F (36.8 C) (Oral)   Resp 16   LMP 04/07/2022 (Exact Date)   SpO2 98%  Physical Exam Vitals and nursing  note reviewed.  Constitutional:      General: She is not in acute distress. HENT:     Head: Normocephalic and atraumatic.     Mouth/Throat:     Comments: Multiple erythematous lesions along the mucous membranes of the upper and lower lips Eyes:     Conjunctiva/sclera: Conjunctivae normal.     Pupils: Pupils are equal, round, and reactive to light.  Cardiovascular:     Rate and Rhythm: Normal rate and regular rhythm.  Pulmonary:     Effort: Pulmonary effort is normal. No respiratory distress.  Abdominal:     General: There is no distension.     Tenderness: There is no guarding.  Musculoskeletal:        General: No deformity or signs of injury.     Cervical back: Neck supple.  Skin:    Findings: No lesion or rash.  Neurological:     General: No focal deficit present.     Mental Status: She is alert. Mental status is at baseline.     ED Results / Procedures / Treatments   Labs (all labs ordered are listed, but only abnormal results are displayed) Labs Reviewed - No data to display  EKG None  Radiology No results found.  Procedures Procedures    Medications Ordered in ED Medications - No data to display  ED Course/ Medical Decision Making/ A&P                           Medical Decision Making   52 year old female with medical history significant for obesity, DM 2, hypertension, previously prescribed acyclovir for HSV who presents to the emergency department with mouth sores.  The patient states that she has had sores in mouth lesions on the upper inside and bottom lip.  She states that her she has been having difficulty eating due to the sores.  She states that they have been there for over a week and a half.  She denies any fevers or headaches.  She is tolerating her secretions.  She has not currently on any antiviral medication.  On arrival, the patient was vitally Stable.  Physical exam concerning for oral lesions consistent with likely HSV.  Additional  differential would include aphthous ulcers and/or Behcet's. The patient has previously been prescribed acyclovir outpatient.  Currently protecting her airway, afebrile.  Will trial a course of Valtrex, Magic mouthwash for pain, follow-up outpatient with her PCP.   Final Clinical Impression(s) / ED Diagnoses Final diagnoses:  Unspecified lesions of oral mucosa    Rx / DC Orders ED Discharge Orders          Ordered    valACYclovir (VALTREX) 1000 MG tablet  3 times daily  05/21/22 0803    magic mouthwash (nystatin, lidocaine, diphenhydrAMINE, alum & mag hydroxide) suspension  3 times daily PRN        05/21/22 5638              Regan Lemming, MD 05/21/22 [phone]

## 2022-05-25 ENCOUNTER — Other Ambulatory Visit: Payer: Medicaid Other | Admitting: Family Medicine

## 2022-05-25 ENCOUNTER — Other Ambulatory Visit: Payer: Medicaid Other | Admitting: Obstetrics

## 2022-05-26 DIAGNOSIS — R21 Rash and other nonspecific skin eruption: Secondary | ICD-10-CM | POA: Diagnosis not present

## 2022-05-26 DIAGNOSIS — K120 Recurrent oral aphthae: Secondary | ICD-10-CM | POA: Diagnosis not present

## 2022-06-03 ENCOUNTER — Other Ambulatory Visit (INDEPENDENT_AMBULATORY_CARE_PROVIDER_SITE_OTHER): Payer: Self-pay | Admitting: Primary Care

## 2022-06-03 DIAGNOSIS — L309 Dermatitis, unspecified: Secondary | ICD-10-CM

## 2022-06-03 DIAGNOSIS — L500 Allergic urticaria: Secondary | ICD-10-CM

## 2022-06-07 ENCOUNTER — Other Ambulatory Visit (INDEPENDENT_AMBULATORY_CARE_PROVIDER_SITE_OTHER): Payer: Self-pay | Admitting: Primary Care

## 2022-06-29 ENCOUNTER — Ambulatory Visit
Admission: RE | Admit: 2022-06-29 | Discharge: 2022-06-29 | Disposition: A | Discharge status: Home / Self Care | Payer: Medicaid Other | Source: Ambulatory Visit | Attending: Internal Medicine | Admitting: Internal Medicine

## 2022-06-29 DIAGNOSIS — Z1231 Encounter for screening mammogram for malignant neoplasm of breast: Secondary | ICD-10-CM

## 2022-06-30 ENCOUNTER — Other Ambulatory Visit (INDEPENDENT_AMBULATORY_CARE_PROVIDER_SITE_OTHER): Payer: Self-pay | Admitting: Primary Care

## 2022-06-30 DIAGNOSIS — L500 Allergic urticaria: Secondary | ICD-10-CM

## 2022-06-30 DIAGNOSIS — L309 Dermatitis, unspecified: Secondary | ICD-10-CM

## 2022-06-30 DIAGNOSIS — J309 Allergic rhinitis, unspecified: Secondary | ICD-10-CM

## 2022-07-13 DIAGNOSIS — R059 Cough, unspecified: Secondary | ICD-10-CM | POA: Diagnosis not present

## 2022-07-20 ENCOUNTER — Encounter: Payer: Self-pay | Admitting: Obstetrics and Gynecology

## 2022-07-20 ENCOUNTER — Other Ambulatory Visit (HOSPITAL_COMMUNITY)
Admission: RE | Admit: 2022-07-20 | Discharge: 2022-07-20 | Disposition: A | Discharge status: Home / Self Care | Payer: Medicaid Other | Source: Ambulatory Visit | Attending: Obstetrics | Admitting: Obstetrics

## 2022-07-20 ENCOUNTER — Ambulatory Visit (INDEPENDENT_AMBULATORY_CARE_PROVIDER_SITE_OTHER): Payer: Medicaid Other | Admitting: Obstetrics and Gynecology

## 2022-07-20 VITALS — BP 126/83 | HR 80 | Ht <= 58 in | Wt 154.0 lb

## 2022-07-20 DIAGNOSIS — Z01812 Encounter for preprocedural laboratory examination: Secondary | ICD-10-CM

## 2022-07-20 DIAGNOSIS — N939 Abnormal uterine and vaginal bleeding, unspecified: Secondary | ICD-10-CM

## 2022-07-20 DIAGNOSIS — D219 Benign neoplasm of connective and other soft tissue, unspecified: Secondary | ICD-10-CM | POA: Diagnosis not present

## 2022-07-20 DIAGNOSIS — D259 Leiomyoma of uterus, unspecified: Secondary | ICD-10-CM | POA: Diagnosis not present

## 2022-07-20 LAB — POCT URINE PREGNANCY: Preg Test, Ur: NEGATIVE

## 2022-07-20 NOTE — Patient Instructions (Signed)
ENDOMETRIAL BIOPSY POST-PROCEDURE INSTRUCTIONS  You may take Ibuprofen, Aleve or Tylenol for pain if needed.  Cramping should resolve within in 24 hours.  You may have a small amount of spotting.  You should wear a mini pad for the next few days.  You may have intercourse after 24 hours.  You need to call if you have any pelvic pain, fever, heavy bleeding or foul smelling vaginal discharge.  Shower or bathe as normal  6. We will call you within one week with results or we will discuss   the results at your follow-up appointment if needed.  Endometrial Biopsy  An endometrial biopsy is a procedure to remove tissue samples from the endometrium, which is the lining of the uterus. The tissue that is removed can then be checked under a microscope for disease. This procedure is used to diagnose conditions such as endometrial cancer, endometrial tuberculosis, polyps, or other inflammatory conditions. This procedure may also be used to investigate uterine bleeding to determine where you are in your menstrual cycle or how your hormone levels are affecting the lining of the uterus. Tell a health care provider about: Any allergies you have. All medicines you are taking, including vitamins, herbs, eye drops, creams, and over-the-counter medicines. Any problems you or family members have had with anesthetic medicines. Any bleeding problems you have. Any surgeries you have had. Any medical conditions you have. Whether you are pregnant or may be pregnant. What are the risks? Your health care provider will talk with you about risks. These may include: Bleeding. Pelvic infection. Puncture of the wall of the uterus with the biopsy device (rare). Allergic reactions to medicines. What happens before the procedure? Keep a record of your menstrual cycles as told by your health care provider. You may need to schedule your procedure for a specific time in your cycle. Bring a sanitary pad in case you need to  wear one after the procedure. Ask your health care provider about: Changing or stopping your regular medicines. These include any diabetes medicines or blood thinners you take. Taking medicines such as aspirin and ibuprofen. These medicines can thin your blood. Do not take these medicines unless your health care provider tells you to. Taking over-the-counter medicines, vitamins, herbs, and supplements. Plan to have someone take you home from the hospital or clinic. What happens during the procedure? You will lie on an exam table with your feet and legs supported as in a pelvic exam. Your health care provider will insert an instrument into your vagina to see your cervix. Your cervix will be cleansed with an antiseptic solution. A medicine (local anesthetic) will be used to numb the cervix. A forceps instrument will be used to hold your cervix steady for the biopsy. A thin, rod-like instrument (uterine sound) will be inserted through your cervix to determine the length of your uterus and the location where the biopsy sample will be removed. A thin, flexible tube (catheter) will be inserted through your cervix and into the uterus. The catheter will be used to collect the biopsy sample from your endometrial tissue. The tube and instruments will be removed, and the tissue sample will be sent to a lab for examination. The procedure may vary among health care providers and hospitals. What happens after the procedure? Your blood pressure, heart rate, breathing rate, and blood oxygen level will be monitored until you leave the hospital or clinic. It is up to you to get the results of your procedure. Ask your health care  provider, or the department that is doing the procedure, when your results will be ready. Summary An endometrial biopsy is a procedure to remove tissue samples from the endometrium, which is the lining of the uterus. This procedure is used to diagnose conditions such as endometrial  cancer, endometrial tuberculosis, polyps, or other inflammatory conditions. It is up to you to get the results of your procedure. Ask your health care provider, or the department that is doing the procedure, when your results will be ready. This information is not intended to replace advice given to you by your health care provider. Make sure you discuss any questions you have with your health care provider. Document Revised: 09/28/2021 Document Reviewed: 09/28/2021 Elsevier Patient Education  [employer].

## 2022-07-20 NOTE — Addendum Note (Signed)
Addended by: Penny Pia on: 07/20/2022 02:53 PM   Modules accepted: Orders

## 2022-07-20 NOTE — Progress Notes (Signed)
Ms Mackenzie Patterson is here for [facility] for eval of AUB in setting of uterine fibroids  ENDOMETRIAL BIOPSY     The indications for endometrial biopsy were reviewed.   Risks of the biopsy including cramping, bleeding, infection, uterine perforation, inadequate specimen and need for additional procedures  were discussed. The patient states she understands and agrees to undergo procedure today. Consent was signed. Time out was performed. Urine HCG was negative. During the pelvic exam, the cervix was prepped with Betadine. A single-toothed tenaculum was placed on the anterior lip of the cervix to stabilize it. The 3 mm pipelle was introduced into the endometrial cavity without difficulty to a depth of 8 cm, and a moderate amount of tissue was obtained and sent to pathology. The instruments were removed from the patient's vagina. Minimal bleeding from the cervix was noted. The patient tolerated the procedure well. Routine post-procedure instructions were given to the patient.

## 2022-07-20 NOTE — Progress Notes (Signed)
Presents for EMB

## 2022-07-21 DIAGNOSIS — E119 Type 2 diabetes mellitus without complications: Secondary | ICD-10-CM | POA: Diagnosis not present

## 2022-07-21 DIAGNOSIS — Q210 Ventricular septal defect: Secondary | ICD-10-CM | POA: Diagnosis not present

## 2022-07-21 DIAGNOSIS — Z0001 Encounter for general adult medical examination with abnormal findings: Secondary | ICD-10-CM | POA: Diagnosis not present

## 2022-07-21 DIAGNOSIS — Z6831 Body mass index (BMI) 31.0-31.9, adult: Secondary | ICD-10-CM | POA: Diagnosis not present

## 2022-07-22 LAB — SURGICAL PATHOLOGY

## 2022-08-05 DIAGNOSIS — R1013 Epigastric pain: Secondary | ICD-10-CM | POA: Diagnosis not present

## 2022-08-10 ENCOUNTER — Encounter: Payer: Self-pay | Admitting: Obstetrics and Gynecology

## 2022-08-10 ENCOUNTER — Ambulatory Visit: Payer: Medicaid Other | Admitting: Obstetrics and Gynecology

## 2022-08-10 VITALS — BP 120/77 | HR 73 | Wt 158.0 lb

## 2022-08-10 DIAGNOSIS — N939 Abnormal uterine and vaginal bleeding, unspecified: Secondary | ICD-10-CM

## 2022-08-10 DIAGNOSIS — D219 Benign neoplasm of connective and other soft tissue, unspecified: Secondary | ICD-10-CM

## 2022-08-10 DIAGNOSIS — N8003 Adenomyosis of the uterus: Secondary | ICD-10-CM

## 2022-08-10 MED ORDER — MEGESTROL ACETATE 40 MG PO TABS: 40.0000 mg | ORAL_TABLET | Freq: Every day | ORAL | 5 refills | Status: DC

## 2022-08-10 NOTE — Progress Notes (Signed)
Mackenzie Patterson presents for follow up of her [facility] and discuss management options of her AUB and uterine fibroids EMBX normal GYN U/S fibroids, 436 gms H/O SVD x 3 ( largest 4-5 #)  PE AF VSS Lungs clear Heart RRR Abd soft + BS  A/P AUB        Uterine fibroids  Pt desires definite therapy. TVH with possible salpingectomy reviewed with pt. R/B/Post op care reviewed. Information provided. Hysterectomy papers completed Megace qd for now. F/U with post op appt

## 2022-08-10 NOTE — Progress Notes (Signed)
Pt states she is still having AUB.  Here today for biopsy results and plan.

## 2022-09-13 NOTE — Pre-Procedure Instructions (Signed)
Surgical Instructions    Your procedure is scheduled on September 20, 2022.  Report to [facility] Main Entrance "A" at 12:20 P.M., then check in with the Admitting office.  Call this number if you have problems the morning of surgery:  [phone]  If you have any questions prior to your surgery date call [phone]: Open Monday-Friday 8am-4pm If you experience any cold or flu symptoms such as cough, fever, chills, shortness of breath, etc. between now and your scheduled surgery, please notify us at the above number.     Remember:  Do not eat after midnight the night before your surgery  You may drink clear liquids until 11:20 AM the morning of your surgery.   Clear liquids allowed are: Water, Non-Citrus Juices (without pulp), Carbonated Beverages, Clear Tea, Black Coffee Only (NO MILK, CREAM OR POWDERED CREAMER of any kind), and Gatorade.     Take these medicines the morning of surgery with A SIP OF WATER:  acyclovir (ZOVIRAX)   amLODipine (NORVASC)   atorvastatin (LIPITOR)   cetirizine (ZYRTEC)   megestrol (MEGACE)   SYMBICORT inhaler    May take these medicines IF NEEDED:  acetaminophen (TYLENOL)   albuterol (ACCUNEB) nebulizer   albuterol (VENTOLIN HFA) inhaler   fluticasone (FLONASE) nasal spray    Follow your surgeon's instructions on when to stop clopidogrel (PLAVIX).  If no instructions were given by your surgeon then you will need to call the office to get those instructions.     As of today, STOP taking any Aspirin (unless otherwise instructed by your surgeon) Aleve, Naproxen, Ibuprofen, Motrin, Advil, Goody's, BC's, all herbal medications, fish oil, and all vitamins.           WHAT DO I DO ABOUT MY DIABETES MEDICATION?   Do not take JANUMET the morning of surgery.   HOW TO MANAGE YOUR DIABETES BEFORE AND AFTER SURGERY  Why is it important to control my blood sugar before and after surgery? Improving blood sugar levels before and after surgery helps healing  and can limit problems. A way of improving blood sugar control is eating a healthy diet by:  Eating less sugar and carbohydrates  Increasing activity/exercise  Talking with your doctor about reaching your blood sugar goals High blood sugars (greater than 180 mg/dL) can raise your risk of infections and slow your recovery, so you will need to focus on controlling your diabetes during the weeks before surgery. Make sure that the doctor who takes care of your diabetes knows about your planned surgery including the date and location.  How do I manage my blood sugar before surgery? Check your blood sugar at least 4 times a day, starting 2 days before surgery, to make sure that the level is not too high or low.  Check your blood sugar the morning of your surgery when you wake up and every 2 hours until you get to the Short Stay unit.  If your blood sugar is less than 70 mg/dL, you will need to treat for low blood sugar: Do not take insulin. Treat a low blood sugar (less than 70 mg/dL) with  cup of clear juice (cranberry or apple), 4 glucose tablets, OR glucose gel. Recheck blood sugar in 15 minutes after treatment (to make sure it is greater than 70 mg/dL). If your blood sugar is not greater than 70 mg/dL on recheck, call [phone] for further instructions. Report your blood sugar to the short stay nurse when you get to Short Stay.  If you  are admitted to the hospital after surgery: Your blood sugar will be checked by the staff and you will probably be given insulin after surgery (instead of oral diabetes medicines) to make sure you have good blood sugar levels. The goal for blood sugar control after surgery is 80-180 mg/dL.             Do NOT Smoke (Tobacco/Vaping) for 24 hours prior to your procedure.  If you use a CPAP at night, you may bring your mask/headgear for your overnight stay.   Contacts, glasses, piercing's, hearing aid's, dentures or partials may not be worn into surgery,  please bring cases for these belongings.    For patients admitted to the hospital, discharge time will be determined by your treatment team.   Patients discharged the day of surgery will not be allowed to drive home, and someone needs to stay with them for 24 hours.  SURGICAL WAITING ROOM VISITATION Patients having surgery or a procedure may have no more than 2 support people in the waiting area - these visitors may rotate.   Children under the age of 65 must have an adult with them who is not the patient. If the patient needs to stay at the hospital during part of their recovery, the visitor guidelines for inpatient rooms apply. Pre-op nurse will coordinate an appropriate time for 1 support person to accompany patient in pre-op.  This support person may not rotate.   Please refer to the [facility] website for the visitor guidelines for Inpatients (after your surgery is over and you are in a regular room).    Special instructions:   [facility]- Preparing For Surgery  Before surgery, you can play an important role. Because skin is not sterile, your skin needs to be as free of germs as possible. You can reduce the number of germs on your skin by washing with CHG (chlorahexidine gluconate) Soap before surgery.  CHG is an antiseptic cleaner which kills germs and bonds with the skin to continue killing germs even after washing.    Oral Hygiene is also important to reduce your risk of infection.  Remember - BRUSH YOUR TEETH THE MORNING OF SURGERY WITH YOUR REGULAR TOOTHPASTE  Please do not use if you have an allergy to CHG or antibacterial soaps. If your skin becomes reddened/irritated stop using the CHG.  Do not shave (including legs and underarms) for at least 48 hours prior to first CHG shower. It is OK to shave your face.  Please follow these instructions carefully.   Shower the NIGHT BEFORE SURGERY and the MORNING OF SURGERY  If you chose to wash your hair, wash your hair first as usual  with your normal shampoo.  After you shampoo, rinse your hair and body thoroughly to remove the shampoo.  Use CHG Soap as you would any other liquid soap. You can apply CHG directly to the skin and wash gently with a scrungie or a clean washcloth.   Apply the CHG Soap to your body ONLY FROM THE NECK DOWN.  Do not use on open wounds or open sores. Avoid contact with your eyes, ears, mouth and genitals (private parts). Wash Face and genitals (private parts)  with your normal soap.   Wash thoroughly, paying special attention to the area where your surgery will be performed.  Thoroughly rinse your body with warm water from the neck down.  DO NOT shower/wash with your normal soap after using and rinsing off the CHG Soap.  Pat yourself  dry with a CLEAN TOWEL.  Wear CLEAN PAJAMAS to bed the night before surgery  Place CLEAN SHEETS on your bed the night before your surgery  DO NOT SLEEP WITH PETS.   Day of Surgery: Take a shower with CHG soap. Do not wear jewelry or makeup Do not wear lotions, powders, perfumes/colognes, or deodorant. Do not shave 48 hours prior to surgery.  Men may shave face and neck. Do not bring valuables to the hospital.  [facility] is not responsible for any belongings or valuables. Do not wear nail polish, gel polish, artificial nails, or any other type of covering on natural nails (fingers and toes) If you have artificial nails or gel coating that need to be removed by a nail salon, please have this removed prior to surgery. Artificial nails or gel coating may interfere with anesthesia's ability to adequately monitor your vital signs.  Wear Clean/Comfortable clothing the morning of surgery Remember to brush your teeth WITH YOUR REGULAR TOOTHPASTE.   Please read over the following fact sheets that you were given.    If you received a COVID test during your pre-op visit  it is requested that you wear a mask when out in public, stay away from anyone that may not  be feeling well and notify your surgeon if you develop symptoms. If you have been in contact with anyone that has tested positive in the last 10 days please notify you surgeon.

## 2022-09-14 ENCOUNTER — Encounter (HOSPITAL_COMMUNITY): Payer: Self-pay | Admitting: Certified Registered"

## 2022-09-14 ENCOUNTER — Encounter (HOSPITAL_COMMUNITY)
Admission: RE | Admit: 2022-09-14 | Discharge: 2022-09-14 | Disposition: A | Discharge status: Home / Self Care | Payer: Medicaid Other | Source: Ambulatory Visit | Attending: Obstetrics and Gynecology | Admitting: Obstetrics and Gynecology

## 2022-09-14 ENCOUNTER — Other Ambulatory Visit: Payer: Self-pay

## 2022-09-14 ENCOUNTER — Encounter (HOSPITAL_COMMUNITY): Payer: Self-pay

## 2022-09-14 ENCOUNTER — Encounter (HOSPITAL_COMMUNITY): Payer: Self-pay | Admitting: Physician Assistant

## 2022-09-14 VITALS — BP 128/62 | HR 82 | Temp 98.2°F | Resp 17 | Ht <= 58 in | Wt 153.0 lb

## 2022-09-14 DIAGNOSIS — E119 Type 2 diabetes mellitus without complications: Secondary | ICD-10-CM

## 2022-09-14 DIAGNOSIS — Z01818 Encounter for other preprocedural examination: Secondary | ICD-10-CM

## 2022-09-14 DIAGNOSIS — I2510 Atherosclerotic heart disease of native coronary artery without angina pectoris: Secondary | ICD-10-CM | POA: Diagnosis not present

## 2022-09-14 HISTORY — DX: Dyspnea, unspecified: R06.00

## 2022-09-14 HISTORY — DX: Headache, unspecified: R51.9

## 2022-09-14 LAB — CBC
HCT: 35.7 % — ABNORMAL LOW (ref 36.0–46.0)
Hemoglobin: 11.7 g/dL — ABNORMAL LOW (ref 12.0–15.0)
MCH: 29.1 pg (ref 26.0–34.0)
MCHC: 32.8 g/dL (ref 30.0–36.0)
MCV: 88.8 fL (ref 80.0–100.0)
Platelets: 308 10*3/uL (ref 150–400)
RBC: 4.02 MIL/uL (ref 3.87–5.11)
RDW: 13.8 % (ref 11.5–15.5)
WBC: 4.4 10*3/uL (ref 4.0–10.5)
nRBC: 0 % (ref 0.0–0.2)

## 2022-09-14 LAB — BASIC METABOLIC PANEL
Anion gap: 7 (ref 5–15)
BUN: 6 mg/dL (ref 6–20)
CO2: 27 mmol/L (ref 22–32)
Calcium: 9 mg/dL (ref 8.9–10.3)
Chloride: 105 mmol/L (ref 98–111)
Creatinine, Ser: 0.72 mg/dL (ref 0.44–1.00)
GFR, Estimated: >60 mL/min (ref >60)
Glucose, Bld: 102 mg/dL — ABNORMAL HIGH (ref 70–99)
Potassium: 4.1 mmol/L (ref 3.5–5.1)
Sodium: 139 mmol/L (ref 135–145)

## 2022-09-14 LAB — TYPE AND SCREEN
ABO/RH(D): O POS
Antibody Screen: NEGATIVE

## 2022-09-14 LAB — GLUCOSE, CAPILLARY: Glucose-Capillary: 134 mg/dL — ABNORMAL HIGH (ref 70–99)

## 2022-09-14 NOTE — H&P (Signed)
Mackenzie Patterson is an 52 y.o. female, with AUB. EMBX normal GYN U/S, uterine fibroids, 436 gms.  H/O TSVD x 3 ( 6 #)   Menstrual History: Menarche age: 70 Patient's last menstrual period was 08/29/2022.    Past Medical History:  Diagnosis Date   Asthma    Breast discharge 01/10/2017   Congenital ventricular septal defect    Diabetes mellitus without complication (HCC)    Dyspnea    R/T heart defect   Headache    Heart murmur    Hypertension    Obesity    Pulmonary hypertension (HCC)    mild    Stroke ([place]) 2023   TIA x9   Tricuspid regurgitation    VSD (ventricular septal defect), perimembranous    Qp:Qs 1.7:1 by catheterization     Past Surgical History:  Procedure Laterality Date   CARDIAC CATHETERIZATION [name] 10/30/2014   Procedure: Right Heart Cath;  Surgeon: [name], MD;  Location: [place] [facility] CUPID;  Service: Cardiovascular;  Laterality: [name];   CHOLECYSTECTOMY  2018   CYST EXCISION     LEFT AND RIGHT HEART CATHETERIZATION WITH CORONARY ANGIOGRAM [name] 09/11/2013   Procedure: LEFT AND RIGHT HEART CATHETERIZATION WITH CORONARY ANGIOGRAM;  Surgeon: [name], MD;  Location: [facility] CATH LAB;  Service: Cardiovascular;  Laterality: [name];   RIGHT HEART CATH [name] 09/02/2016   Procedure: Right Heart Cath;  Surgeon: [name], MD;  Location: [place] [facility];  Service: Cardiovascular;  Laterality: [name];   TUBAL LIGATION      Family History  Problem Relation Age of Onset   Diabetes Mother    Diabetes Father    Cancer Other    Hyperlipidemia Other    Hypertension Other    Asthma Other    Breast cancer Neg Hx     Social History:  reports that she has never smoked. She has never used smokeless tobacco. She reports that she does not currently use alcohol. She reports that she does not use drugs.  Allergies:  Allergies  Allergen Reactions   Penicillins Anaphylaxis    Has patient had a PCN reaction causing immediate rash, facial/tongue/throat swelling, SOB  or lightheadedness with hypotension: Yes Has patient had a PCN reaction causing severe rash involving mucus membranes or skin necrosis: Yes Has patient had a PCN reaction that required hospitalization Yes- went to ED, was not admitted Has patient had a PCN reaction occurring within the last 10 years: No- longer then 10 years If all of the above answers are "NO", then may proceed with Cephalosporin use.    Tape Rash    Other reaction(s): Other (See Comments) Burns skin   Other Rash    Other reaction(s): Other (See Comments) **EKG GEL PADS**  "Burns my skin" Pt states she is allergic to a blood pressure medication, unsure.  **EKG GEL PADS**  "Burns my skin"   Drug Ingredient [Benzonatate] Other (See Comments) and Cough    Makes cough worse   Vitamin K Nausea And Vomiting   Latex Rash    No medications prior to admission.    Review of Systems  Constitutional: Negative.   Respiratory: Negative.    Cardiovascular: Negative.   Gastrointestinal: Negative.   Genitourinary:  Positive for menstrual problem.    Last menstrual period 08/29/2022. Physical Exam Constitutional:      Appearance: Normal appearance.  Cardiovascular:     Rate and Rhythm: Normal rate and regular rhythm.     Heart sounds: Normal  heart sounds.  Pulmonary:     Effort: Pulmonary effort is normal.     Breath sounds: Normal breath sounds.  Abdominal:     General: Bowel sounds are normal.     Palpations: Abdomen is soft.  Genitourinary:    Comments: Nl EGBUS, uterus < 8 weeks, mobile, non tender, no masses Neurological:     Mental Status: She is alert.     Results for orders placed or performed during the hospital encounter of 09/14/22 (from the past 24 hour(s))  Glucose, capillary     Status: Abnormal   Collection Time: 09/14/22  8:56 AM  Result Value Ref Range   Glucose-Capillary 134 (H) 70 - 99 mg/dL  CBC per protocol     Status: Abnormal   Collection Time: 09/14/22  9:13 AM  Result Value Ref  Range   WBC 4.4 4.0 - 10.5 K/uL   RBC 4.02 3.87 - 5.11 MIL/uL   Hemoglobin 11.7 (L) 12.0 - 15.0 g/dL   HCT 35.7 (L) 36.0 - 46.0 %   MCV 88.8 80.0 - 100.0 fL   MCH 29.1 26.0 - 34.0 pg   MCHC 32.8 30.0 - 36.0 g/dL   RDW 13.8 11.5 - 15.5 %   Platelets 308 150 - 400 K/uL   nRBC 0.0 0.0 - 0.2 %  Type and screen [place]     Status: None (Preliminary result)   Collection Time: 09/14/22  9:30 AM  Result Value Ref Range   ABO/RH(D) PENDING    Antibody Screen PENDING    Sample Expiration 09/28/2022,2359    Extend sample reason      NO TRANSFUSIONS OR PREGNANCY IN THE PAST 3 MONTHS Performed at [facility], [address], [place], [place] [address]     No results found.  Assessment/Plan: AUB Uterine fibroids  Pt desires definite therapy.  TVH with possible salpingectomy reviewed with pt. R/B/Post op care discussed with pt. Pt has verbalized understanding and desires to proceed.   Chancy Milroy 09/14/2022, 11:21 AM

## 2022-09-14 NOTE — Progress Notes (Signed)
PCP - Dr. Jilda Panda Cardiologist - Dr. Morey Hummingbird Ward  PPM/ICD - Denies Device Orders - n/a Rep Notified - n/a  Chest x-ray - n/a EKG - 09/14/2022 Stress Test - 11/29/2012 ECHO - 08/04/2019 Cardiac Cath - 09/02/2016  Sleep Study - Denies CPAP - n/a  Pt is DM2. She only checks her blood sugar if she feels symptomatic. Normal fasting range is 110s. CBG at pre-op appointment 134. She has had nothing to eat today and only some juice to take her medication.  Last dose of GLP1 agonist- n/a GLP1 instructions: n/a  Blood Thinner Instructions: Pt instructed to call prescribing MD today to receive instructions on holding her Plavix. Aspirin Instructions: n/a  ERAS Protcol - Clear liquids until 1120 morning of surgery PRE-SURGERY Ensure or G2- n/a  COVID TEST- n/a   Anesthesia review: Yes. Pt has a congenital heart defect that is followed at [facility] and will need cardiac clearance prior to surgery. She also takes Plavix daily per her Neurologist for frequent TIAs. Pt instructed to reach out to both offices for cardiac clearance and hold instructions for Plavix. Discussed with Karoline Caldwell, PA-C  Patient denies shortness of breath, fever, cough and chest pain at PAT appointment. Pt denies any respiratory illness/infection in the last two months.   All instructions explained to the patient, with a verbal understanding of the material. Patient agrees to go over the instructions while at home for a better understanding. Patient also instructed to self quarantine after being tested for COVID-19. The opportunity to ask questions was provided.

## 2022-09-15 LAB — HEMOGLOBIN A1C
Hgb A1c MFr Bld: 5.9 % — ABNORMAL HIGH (ref 4.8–5.6)
Mean Plasma Glucose: 123 mg/dL

## 2022-09-16 NOTE — Anesthesia Preprocedure Evaluation (Signed)
Anesthesia Evaluation    Airway        Dental   Pulmonary           Cardiovascular hypertension,      Neuro/Psych    GI/Hepatic   Endo/Other  diabetes    Renal/GU      Musculoskeletal   Abdominal   Peds  Hematology   Anesthesia Other Findings   Reproductive/Obstetrics                              Anesthesia Physical Anesthesia Plan  ASA:   Anesthesia Plan:    Post-op Pain Management:    Induction:   PONV Risk Score and Plan:   Airway Management Planned:   Additional Equipment:   Intra-op Plan:   Post-operative Plan:   Informed Consent:   Plan Discussed with:   Anesthesia Plan Comments: (PAT note by Karoline Caldwell, PA-C:  Follows with cardiology at [facility] for hx of perimembranous VSD. Last echo 12/27/21 showed normal biventricular function, mild TR, small pericardial effusion, perimembranous VSD with peak gradient 6.24m/sec, 149mmHg.  Pt follows with neurology at [facility] for hx of multiple CVA/TIA and is maintained on Plavix. At time of her PAT appt pt stated she had not received instruction on when to discontinue  NIDDM2, A1c 5.9 on preop labs.   Preop labs reviewed, stable chronic anemia with Hgb 11.7, otherwise unremarkable.   I reached out to Dr. Rip Harbour to advise that pt need instructions on Plavix as well as clearance from cardiology. He stated he would postpone the case to allow for this to be completed.  EKG 09/14/22:  NSR. Rate 81. Rightward axis.  TTE 12/27/21 (care everywhere): INTERPRETATION ---------------------------------------------------------------    NORMAL LEFT VENTRICULAR SYSTOLIC FUNCTION    NORMAL RIGHT VENTRICULAR SYSTOLIC FUNCTION    VALVULAR REGURGITATION: TRIVIAL MR, TRIVIAL PR, MILD TR    NO VALVULAR STENOSIS    SMALL PERICARDIAL EFFUSION (See above)    PERIMEMBRANEOUS VSD WITH PEAK GRADIENT = 6.42m/sec, 142mmHg    LIKELY MILD TR, DIFFICULT TO QUANTIFY DUE  TO OVERLAPPING VSD COLOR JET.   Cardiac MRI 03/18/20 (care everywhere):   1. The left ventricle is normal in size and wall thickness. Regional and global systolic function are  normal.  The LVEF is 60% (prior 55-60%).   2. There is a membranous VSD measuring 0.8 x 0.4 cm (prior 0.7 x 0.4 cm). There is left ventricle to right  ventricle flow as well as some degree of LV to RA shunt. The Qp/Qs when measured at the aortic and  pulmonary vales is approximately 2.0 (prior 1.3). When the VSD flow is measured directly the Qp/Qs measures  approximately 1.4 but may be an underestimate because all VSD flow is not accounted for  (LV/RV and LV/RA  shunts).   3. The right ventricle is normal in size and systolic function.   4.  The right atrium is normal in size. The left atrium is mildly dilated. There is no atrial septal defect  seen, but a patent foramen ovale or small secundum atrial septal defect cannot be ruled out.   5.  The aortic valve is tricuspid. There is no aortic stenosis, peak velocity measures 1.1 m/s (prior peak  velocity 1.2 m/s). There is no aortic regurgitation.  The pulmonic valve is tricupsid with no stenosis and  mild regurgitation.  Peak pulmonic velocity measures 1.1 m/s (prior 1.0 m/s).   6. There is trivial mitral  regurgitation. There is at least mild tricuspid valve regurgitation.   7. Delayed enhancement MRI for viability is normal. There is no evidence of myocardial infarction,  scarring, or infiltration.   8.  There is a small to moderate pericardial effusion, largest adjacent to the inferior wall of the LV  measuring 1.8 cm (prior 1.0 cm).    )         Anesthesia Quick Evaluation

## 2022-09-16 NOTE — Progress Notes (Signed)
Anesthesia Chart Review:  Follows with cardiology at [facility] for hx of perimembranous VSD. Last echo 12/27/21 showed normal biventricular function, mild TR, small pericardial effusion, perimembranous VSD with peak gradient 6.53m/sec, 124mmHg.  Pt follows with neurology at [facility] for hx of multiple CVA/TIA and is maintained on Plavix. At time of her PAT appt pt stated she had not received instruction on when to discontinue  NIDDM2, A1c 5.9 on preop labs.   Preop labs reviewed, stable chronic anemia with Hgb 11.7, otherwise unremarkable.   I reached out to Dr. Rip Harbour to advise that pt need instructions on Plavix as well as clearance from cardiology. He stated he would postpone the case to allow for this to be completed.  EKG 09/14/22:  NSR. Rate 81. Rightward axis.  TTE 12/27/21 (care everywhere): INTERPRETATION ---------------------------------------------------------------    NORMAL LEFT VENTRICULAR SYSTOLIC FUNCTION    NORMAL RIGHT VENTRICULAR SYSTOLIC FUNCTION    VALVULAR REGURGITATION: TRIVIAL MR, TRIVIAL PR, MILD TR    NO VALVULAR STENOSIS    SMALL PERICARDIAL EFFUSION (See above)    PERIMEMBRANEOUS VSD WITH PEAK GRADIENT = 6.64m/sec, 130mmHg    LIKELY MILD TR, DIFFICULT TO QUANTIFY DUE TO OVERLAPPING VSD COLOR JET.   Cardiac MRI 03/18/20 (care everywhere):   1. The left ventricle is normal in size and wall thickness. Regional and global systolic function are  normal.  The LVEF is 60% (prior 55-60%).   2. There is a membranous VSD measuring 0.8 x 0.4 cm (prior 0.7 x 0.4 cm). There is left ventricle to right  ventricle flow as well as some degree of LV to RA shunt. The Qp/Qs when measured at the aortic and  pulmonary vales is approximately 2.0 (prior 1.3). When the VSD flow is measured directly the Qp/Qs measures  approximately 1.4 but may be an underestimate because all VSD flow is not accounted for  (LV/RV and LV/RA  shunts).   3. The right ventricle is normal in size and systolic function.    4.  The right atrium is normal in size. The left atrium is mildly dilated. There is no atrial septal defect  seen, but a patent foramen ovale or small secundum atrial septal defect cannot be ruled out.   5.  The aortic valve is tricuspid. There is no aortic stenosis, peak velocity measures 1.1 m/s (prior peak  velocity 1.2 m/s). There is no aortic regurgitation.  The pulmonic valve is tricupsid with no stenosis and  mild regurgitation.  Peak pulmonic velocity measures 1.1 m/s (prior 1.0 m/s).   6. There is trivial mitral regurgitation. There is at least mild tricuspid valve regurgitation.   7. Delayed enhancement MRI for viability is normal. There is no evidence of myocardial infarction,  scarring, or infiltration.   8.  There is a small to moderate pericardial effusion, largest adjacent to the inferior wall of the LV  measuring 1.8 cm (prior 1.0 cm).      Wynonia Musty [facility] Short Stay Center/Anesthesiology Phone [phone] 09/16/2022 3:53 PM

## 2022-09-19 ENCOUNTER — Encounter: Payer: Self-pay | Admitting: Obstetrics and Gynecology

## 2022-09-19 NOTE — Progress Notes (Signed)
Anesthesia follow-up: See previous note by  Karoline Caldwell, PA-C. I followed up with Dr. Rip Harbour. He reported that Ms. Swiss was given instructions to hold Plavix last week. She also reached out to cardiology, and she has a virtual visit scheduled for 09/19/22 at 3:00 PM. Note is still pending. I asked Dr. Rip Harbour to fax any clearance records received if not viewable in Care Everywhere or [facility].    Myra Gianotti, PA-C Surgical Short Stay/Anesthesiology [facility] Phone [phone] [facility] Phone [phone] 09/19/2022 4:59 PM

## 2022-09-20 ENCOUNTER — Encounter (HOSPITAL_COMMUNITY): Admission: RE | Payer: Self-pay | Source: Home / Self Care

## 2022-09-20 ENCOUNTER — Ambulatory Visit (HOSPITAL_COMMUNITY)
Admission: RE | Admit: 2022-09-20 | Payer: Medicaid Other | Source: Home / Self Care | Admitting: Obstetrics and Gynecology

## 2022-09-20 DIAGNOSIS — D219 Benign neoplasm of connective and other soft tissue, unspecified: Secondary | ICD-10-CM

## 2022-09-20 SURGERY — HYSTERECTOMY, VAGINAL
Anesthesia: Choice | Laterality: Bilateral

## 2022-09-27 DIAGNOSIS — Z1211 Encounter for screening for malignant neoplasm of colon: Secondary | ICD-10-CM | POA: Diagnosis not present

## 2022-09-27 DIAGNOSIS — I10 Essential (primary) hypertension: Secondary | ICD-10-CM | POA: Diagnosis not present

## 2022-09-27 DIAGNOSIS — Z1331 Encounter for screening for depression: Secondary | ICD-10-CM | POA: Diagnosis not present

## 2022-09-27 DIAGNOSIS — E119 Type 2 diabetes mellitus without complications: Secondary | ICD-10-CM | POA: Diagnosis not present

## 2022-09-27 DIAGNOSIS — D649 Anemia, unspecified: Secondary | ICD-10-CM | POA: Diagnosis not present

## 2022-09-27 DIAGNOSIS — I272 Pulmonary hypertension, unspecified: Secondary | ICD-10-CM | POA: Diagnosis not present

## 2022-09-27 DIAGNOSIS — J4520 Mild intermittent asthma, uncomplicated: Secondary | ICD-10-CM | POA: Diagnosis not present

## 2022-09-27 DIAGNOSIS — Q210 Ventricular septal defect: Secondary | ICD-10-CM | POA: Diagnosis not present

## 2022-09-28 ENCOUNTER — Ambulatory Visit: Payer: Medicaid Other | Admitting: Obstetrics and Gynecology

## 2022-09-28 ENCOUNTER — Encounter: Payer: Self-pay | Admitting: Obstetrics and Gynecology

## 2022-09-28 VITALS — BP 115/77 | HR 86 | Wt 158.0 lb

## 2022-09-28 DIAGNOSIS — N939 Abnormal uterine and vaginal bleeding, unspecified: Secondary | ICD-10-CM | POA: Diagnosis not present

## 2022-09-28 DIAGNOSIS — D219 Benign neoplasm of connective and other soft tissue, unspecified: Secondary | ICD-10-CM | POA: Diagnosis not present

## 2022-09-28 NOTE — Progress Notes (Signed)
Ms Anders presents to reschedule for TVH/BS See prior office notes Pt has completed pre op clearance.  Surgery reviewed with pt Information provided Hysterectomy previously signed  Will reschedule and follow up with post op appt

## 2022-09-30 ENCOUNTER — Telehealth: Payer: Self-pay

## 2022-09-30 NOTE — Telephone Encounter (Signed)
Called patient with surgery date, time, location, preop instructions and call back number.

## 2022-10-03 ENCOUNTER — Encounter (HOSPITAL_COMMUNITY): Payer: Self-pay | Admitting: Obstetrics and Gynecology

## 2022-10-03 ENCOUNTER — Telehealth: Payer: Self-pay | Admitting: *Deleted

## 2022-10-03 ENCOUNTER — Other Ambulatory Visit: Payer: Self-pay

## 2022-10-03 NOTE — Progress Notes (Signed)
Anesthesia Chart Review: Maury Dus  Case: [id number] Date/Time: 10/04/22 0715   Procedure: HYSTERECTOMY VAGINAL WITH POSSIBLE SALPINGECTOMY (Bilateral)   Anesthesia type: Choice   Pre-op diagnosis:      AUB     Fibroids   [facility]: [facility] ROOM 07 / [facility] OR   Surgeons: Hermina Staggers, MD       DISCUSSION: See previous note by Antionette Poles, PA-C, Date of Service 09/14/22. Hysterectomy postponed from 09/20/22 to 10/04/22 so she could have an in-person cardiology evaluation due to her history of perimembranous VSD and TIA/CVA (see below).   History includes never smoker, murmur/congenital VSD (declined surgical repair since 2016), pulmonary hypertension, TR (mild 12/2021 echo), chronic dyspnea, asthma, DM2, cholecystectomy (11/13/15)  She follows with [facility]ologist Dr. Georga Hacking Ward for history of perimembranous VSD. Last echo 12/27/21 showed normal biventricular function, mild TR, small pericardial effusion, perimembranous VSD with peak gradient 6.71m/sec, . She was evaluated by Dr. Patrick North on 09/27/22 who wrote, "You are doing great and we have no changes to your current regimen.   We will plan for proBNP prior to your procedure but no need for any additional pre-procedural testing.   Will plan to see you back in 4 months for your routine yearly echo and surveillance." BNP, CBC, TIBC, Ferritin, urine albumin/Creatinine ration done at [facility] on 09/27/22. NT-Pro-BNP 48, CBC normal    She follows with neurology at [facility] for history of migraines on topiramate and multiple TIAs (reported as "9" TIAs; had evidence of a small chronic left cerebellar infarct on 09/24/19 MRI) and is maintained on Plavix.  She reported instructions to hold Plavix after 09/28/22 dose. Anesthesia team to evaluate on the day of surgery. She had CBC and BMET within the past 30 days--Dr. Alysia Penna has entered orders for urine pregnancy test, BMP, CBC, T&S.    VS: LMP 08/29/2022  BP Readings from Last 3 Encounters:  09/28/22  115/77  09/14/22 128/62  08/10/22 120/77   Pulse Readings from Last 3 Encounters:  09/28/22 86  09/14/22 82  08/10/22 73     PROVIDERS: Ralene Ok, MD is listed as PCP. She had 09/27/22 with Regenia Skeeter, DO for annual exam ([facility] in [place], [place]). After communicating with cardiologist, Amlodipine decreased to 2.5 mg daily due to hypotensive episodes.   Ward, Georga Hacking, MD is cardiologist ([facility]) Nunzio Cobbs, MD is neurologist    LABS: Last lab results in [facility] include: Lab Results  Component Value Date   WBC 4.4 09/14/2022   HGB 11.7 (L) 09/14/2022   HCT 35.7 (L) 09/14/2022   PLT 308 09/14/2022   GLUCOSE 102 (H) 09/14/2022   NA 139 09/14/2022   K 4.1 09/14/2022   CL 105 09/14/2022   CREATININE 0.72 09/14/2022   BUN 6 09/14/2022   CO2 27 09/14/2022   HGBA1C 5.9 (H) 09/14/2022  She had a normal CBC on 09/27/22 at [facility].   Overnight Polysomnogram 11/22/19 ([facility]):  INTERPRETATION: 1) No significant sleep apnea (AHI 3/hr)  2) No primary sleep pathology  The SaO2 nadir during sleep was 91%.  CLINICAL CORRELATION:  1) This study does not demonstrate any evidence for primary sleep  pathology. Please consider other etiologies for the patient's  symptoms.    EKG: 09/14/22: Normal sinus rhythm Rightward axis Borderline ECG When compared with ECG of 24-Sep-2019 17:37, PREVIOUS ECG IS PRESENT Since last tracing rate slower Confirmed by Rinaldo Cloud ([id number]) on 09/14/2022 8:50:15 PM  CV: TTE 12/27/21 ([facility]): INTERPRETATION  NORMAL LEFT VENTRICULAR SYSTOLIC FUNCTION  NORMAL RIGHT VENTRICULAR SYSTOLIC FUNCTION  VALVULAR REGURGITATION: TRIVIAL MR, TRIVIAL PR, MILD TR  NO VALVULAR STENOSIS  SMALL PERICARDIAL EFFUSION (See above)  PERIMEMBRANEOUS VSD WITH PEAK GRADIENT = 6.5473m/sec, 187mmHg  LIKELY MILD TR, DIFFICULT TO QUANTIFY DUE TO OVERLAPPING VSD COLOR JET.     Cardiac MRI 03/18/20 ([facility]):   1.  The left ventricle is normal in size and wall thickness. Regional and global systolic function are normal.  The LVEF is 60% (prior 55-60%).  2. There is a membranous VSD measuring 0.8 x 0.4 cm (prior 0.7 x 0.4 cm). There is left ventricle to right ventricle flow as well as some degree of LV to RA shunt. The Qp/Qs when measured at the aortic and pulmonary vales is approximately 2.0 (prior 1.3). When the VSD flow is measured directly the Qp/Qs measures approximately 1.4 but may be an underestimate because all VSD flow is not accounted for  (LV/RV and LV/RA shunts).  3. The right ventricle is normal in size and systolic function.  4.  The right atrium is normal in size. The left atrium is mildly dilated. There is no atrial septal defect seen, but a patent foramen ovale or small secundum atrial septal defect cannot be ruled out.  5.  The aortic valve is tricuspid. There is no aortic stenosis, peak velocity measures 1.1 m/s (prior peak velocity 1.2 m/s). There is no aortic regurgitation.  The pulmonic valve is tricupsid with no stenosis and mild regurgitation.  Peak pulmonic velocity measures 1.1 m/s (prior 1.0 m/s).  6. There is trivial mitral regurgitation. There is at least mild tricuspid valve regurgitation.  7. Delayed enhancement MRI for viability is normal. There is no evidence of myocardial infarction, scarring, or infiltration.  8.  There is a small to moderate pericardial effusion, largest adjacent to the inferior wall of the LV measuring 1.8 cm (prior 1.0 cm).    RHC 09/02/16: Hemodynamic findings consistent with moderate pulmonary hypertension.  Moderate pulmonary hypertension without significant change when compared to 2016 data. Pulmonary vascular resistance 2.81 Woods units Qp/Qs = 1.1    RHC/LHC 12/15/14 ([facility], scanned under Results Review): IMPRESSIONS: Mildly elevated right heart pressures at baseline with normal pulmonary capillary wedge pressure/left ventricular end-diastolic pressure,  consistent with the presence of mild pulmonary arterial hypertension.  There is a significant step up and oxygen saturations noted at the right ventricular level consistent with perimembranous ventricular septal defect.  With inhaled nitric oxide the pulmonary blood flow increased, the pulmonary artery pressure dropped and the pulmonary vascular resistance dropped from ~ 3 Woods unit to 1.3 Wood units.  Left ventriculography showed a moderate-sized ventricular septal defect and normal left ventricular size and function.  Coronary angiography showed angiographically normal coronaries. RECOMMENDATIONS: Proceed to operative repair of the VSD. - She was evaluated by CT surgeon Phineas InchesLodge, Andrew, MD on 05/04/15 and open VSD repair recommended, but she declined and opted for medical management with monitoring of pulmonary pressures.    Past Medical History:  Diagnosis Date   Asthma    Breast discharge 01/10/2017   Congenital ventricular septal defect    Diabetes mellitus without complication    Dyspnea    R/T heart defect   Headache    Heart murmur    Hypertension    Obesity    Pulmonary hypertension    mild    Stroke 2023   TIA x9   Tricuspid regurgitation  VSD (ventricular septal defect), perimembranous    Qp:Qs 1.7:1 by catheterization     Past Surgical History:  Procedure Laterality Date   CARDIAC CATHETERIZATION [name] 10/30/2014   Procedure: Right Heart Cath;  Surgeon: Peter M Swaziland, MD;  Location: [facility] INVASIVE CV LAB CUPID;  Service: Cardiovascular;  Laterality: [name];   CHOLECYSTECTOMY  2018   CYST EXCISION     LEFT AND RIGHT HEART CATHETERIZATION WITH CORONARY ANGIOGRAM [name] 09/11/2013   Procedure: LEFT AND RIGHT HEART CATHETERIZATION WITH CORONARY ANGIOGRAM;  Surgeon: Micheline Chapman, MD;  Location: [facility] CATH LAB;  Service: Cardiovascular;  Laterality: [name];   RIGHT HEART CATH [name] 09/02/2016   Procedure: Right Heart Cath;  Surgeon: Lyn Records, MD;  Location: [facility] INVASIVE CV LAB;  Service:  Cardiovascular;  Laterality: [name];   TUBAL LIGATION      MEDICATIONS: No current facility-administered medications for this encounter.    Accu-Chek FastClix Lancets MISC   acetaminophen (TYLENOL) 500 MG tablet   acyclovir (ZOVIRAX) 400 MG tablet   albuterol (ACCUNEB) 0.63 MG/3ML nebulizer solution   albuterol (VENTOLIN HFA) 108 (90 Base) MCG/ACT inhaler   amLODipine (NORVASC) 5 MG tablet   atorvastatin (LIPITOR) 40 MG tablet   cetirizine (ZYRTEC) 10 MG tablet   clopidogrel (PLAVIX) 75 MG tablet   fluticasone (FLONASE) 50 MCG/ACT nasal spray   glucose blood ([employer] GLUCOSE METER TEST STRIPS) test strip   ibuprofen (ADVIL) 800 MG tablet   JANUMET 50-1000 MG tablet   magic mouthwash (nystatin, lidocaine, diphenhydrAMINE, alum & mag hydroxide) suspension   SYMBICORT 160-4.5 MCG/ACT inhaler   triamcinolone cream (KENALOG) 0.1 %   valACYclovir (VALTREX) 1000 MG tablet    Shonna Chock, PA-C Surgical Short Stay/Anesthesiology [facility] Phone [phone] [facility] Phone [phone] 10/03/2022 12:57 PM

## 2022-10-03 NOTE — Progress Notes (Signed)
Mackenzie Patterson denies chest pain or shortness of breath.  Patient denies having any s/s of Covid in her household, also denies any known exposure to Covid.  [name] s/s of upper or lower respiratory in the past 8 weeks.   [name]'s PCP is Reatha Armour, Cardiologist is Dr. Georga Hacking Ward.Patient saw Dr. Elesa Massed on 09/27/22, a BNP was drawn, results was 48, no more cardiac studies were needed prior to surgery.

## 2022-10-03 NOTE — Anesthesia Preprocedure Evaluation (Signed)
Anesthesia Evaluation  Patient identified by MRN, date of birth, ID band Patient awake    Reviewed: Allergy & Precautions, H&P , NPO status , Patient's Chart, lab work & pertinent test results  History of Anesthesia Complications (+) PONV and history of anesthetic complications  Airway Mallampati: II   Neck ROM: full    Dental   Pulmonary asthma , COPD   breath sounds clear to auscultation       Cardiovascular hypertension,  Rhythm:regular Rate:Normal  Congenital VSD   Neuro/Psych  Headaches CVA    GI/Hepatic   Endo/Other  diabetes, Type 2    Renal/GU      Musculoskeletal  (+) Arthritis ,    Abdominal   Peds  Hematology   Anesthesia Other Findings   Reproductive/Obstetrics                             Anesthesia Physical Anesthesia Plan  ASA: 3  Anesthesia Plan: General   Post-op Pain Management:    Induction: Intravenous  PONV Risk Score and Plan: 4 or greater and Ondansetron, Dexamethasone, Midazolam and Treatment may vary due to age or medical condition  Airway Management Planned: Oral ETT  Additional Equipment:   Intra-op Plan:   Post-operative Plan: Extubation in OR  Informed Consent: I have reviewed the patients History and Physical, chart, labs and discussed the procedure including the risks, benefits and alternatives for the proposed anesthesia with the patient or authorized representative who has indicated his/her understanding and acceptance.     Dental advisory given  Plan Discussed with: CRNA, Anesthesiologist and Surgeon  Anesthesia Plan Comments: (PAT note written 10/03/2022 by Shonna Chock, PA-C. Has semi-membraneous VSD, declined repair 2016. She has cardiology clearance from [facility]  TTE 12/27/21 ([facility]): INTERPRETATION  NORMAL LEFT VENTRICULAR SYSTOLIC FUNCTION  NORMAL RIGHT VENTRICULAR SYSTOLIC FUNCTION  VALVULAR REGURGITATION: TRIVIAL MR, TRIVIAL PR,  MILD TR  NO VALVULAR STENOSIS  SMALL PERICARDIAL EFFUSION (See above)  PERIMEMBRANEOUS VSD WITH PEAK GRADIENT = 6.27m/sec,  LIKELY MILD TR, DIFFICULT TO QUANTIFY DUE TO OVERLAPPING VSD COLOR JET.    Cardiac MRI 03/18/20 ([facility]):  1. The left ventricle is normal in size and wall thickness. Regional and global systolic function are normal. The LVEF is 60% (prior 55-60%).  2. There is a membranous VSD measuring 0.8 x 0.4 cm (prior 0.7 x 0.4 cm). There is left ventricle to right ventricle flow aswell as some degree of LV to RA shunt. The Qp/Qs when measured at the aortic and pulmonary vales is approximately 2.0 (prior 1.3). When the VSD flow is measured directly the Qp/Qs measures approximately 1.4 but may be an underestimate because all VSD flow is not accounted for (LV/RV and LV/RA shunts).  3. The right ventricle is normal in size and systolic function.  4. The right atrium is normal in size. The left atrium is mildly dilated. There is no atrial septal defect seen, but a patent foramen ovale or small secundum atrial septal defect cannot be ruled out.  5. The aortic valve is tricuspid. There is no aortic stenosis, peak velocity measures 1.1 m/s (prior peak velocity 1.2 m/s). There is no aortic regurgitation. The pulmonic valve is tricupsid with no stenosis and mild regurgitation. Peak pulmonic velocity measures 1.1 m/s (prior 1.0 m/s).  6. There is trivial mitral regurgitation. There is at least mild tricuspid valve regurgitation.  7. Delayed enhancement MRI for viability is normal. There is no evidence  of myocardial infarction, scarring, or infiltration.  8. There is a small to moderate pericardial effusion, largest adjacent to the inferior wall of the LV measuring 1.8 cm (prior 1.0 cm).    RHC 09/02/16: Hemodynamic findings consistent with moderate pulmonary hypertension.  Moderate pulmonary hypertension without significant change when compared to 2016 data. Pulmonary vascular  resistance 2.81 Woods units Qp/Qs = 1.1  )       Anesthesia Quick Evaluation

## 2022-10-03 NOTE — Telephone Encounter (Signed)
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 States the  previous orders were released and need new orders except do  not need new orders for CBC, BMET. Also asked for a call back that we received this message. I called Jan and she thanked me for call and states orders have already been entered. Nancy Fetter

## 2022-10-04 ENCOUNTER — Observation Stay (HOSPITAL_COMMUNITY): Payer: Medicaid Other | Admitting: Vascular Surgery

## 2022-10-04 ENCOUNTER — Other Ambulatory Visit: Payer: Self-pay

## 2022-10-04 ENCOUNTER — Encounter (HOSPITAL_COMMUNITY)
Admission: RE | Disposition: A | Discharge status: Home / Self Care | Payer: Self-pay | Source: Home / Self Care | Attending: Obstetrics and Gynecology

## 2022-10-04 ENCOUNTER — Encounter (HOSPITAL_COMMUNITY): Payer: Self-pay | Admitting: Obstetrics and Gynecology

## 2022-10-04 ENCOUNTER — Observation Stay (HOSPITAL_COMMUNITY)
Admission: RE | Admit: 2022-10-04 | Discharge: 2022-10-05 | Disposition: A | Discharge status: Home / Self Care | Payer: Medicaid Other | Attending: Obstetrics and Gynecology | Admitting: Obstetrics and Gynecology

## 2022-10-04 ENCOUNTER — Observation Stay (HOSPITAL_BASED_OUTPATIENT_CLINIC_OR_DEPARTMENT_OTHER): Payer: Medicaid Other | Admitting: Vascular Surgery

## 2022-10-04 DIAGNOSIS — J449 Chronic obstructive pulmonary disease, unspecified: Secondary | ICD-10-CM | POA: Diagnosis not present

## 2022-10-04 DIAGNOSIS — Z7902 Long term (current) use of antithrombotics/antiplatelets: Secondary | ICD-10-CM | POA: Insufficient documentation

## 2022-10-04 DIAGNOSIS — I10 Essential (primary) hypertension: Secondary | ICD-10-CM | POA: Insufficient documentation

## 2022-10-04 DIAGNOSIS — Z79899 Other long term (current) drug therapy: Secondary | ICD-10-CM | POA: Diagnosis not present

## 2022-10-04 DIAGNOSIS — J45909 Unspecified asthma, uncomplicated: Secondary | ICD-10-CM | POA: Insufficient documentation

## 2022-10-04 DIAGNOSIS — D259 Leiomyoma of uterus, unspecified: Principal | ICD-10-CM | POA: Insufficient documentation

## 2022-10-04 DIAGNOSIS — N920 Excessive and frequent menstruation with regular cycle: Secondary | ICD-10-CM

## 2022-10-04 DIAGNOSIS — N939 Abnormal uterine and vaginal bleeding, unspecified: Secondary | ICD-10-CM | POA: Diagnosis not present

## 2022-10-04 DIAGNOSIS — N8003 Adenomyosis of the uterus: Secondary | ICD-10-CM | POA: Diagnosis not present

## 2022-10-04 DIAGNOSIS — Z91040 Latex allergy status: Secondary | ICD-10-CM | POA: Diagnosis not present

## 2022-10-04 DIAGNOSIS — D219 Benign neoplasm of connective and other soft tissue, unspecified: Secondary | ICD-10-CM

## 2022-10-04 DIAGNOSIS — E119 Type 2 diabetes mellitus without complications: Secondary | ICD-10-CM

## 2022-10-04 DIAGNOSIS — Z8673 Personal history of transient ischemic attack (TIA), and cerebral infarction without residual deficits: Secondary | ICD-10-CM | POA: Insufficient documentation

## 2022-10-04 DIAGNOSIS — N988 Other complications associated with artificial fertilization: Secondary | ICD-10-CM

## 2022-10-04 DIAGNOSIS — Z9889 Other specified postprocedural states: Secondary | ICD-10-CM

## 2022-10-04 HISTORY — DX: Angina pectoris, unspecified: I20.9

## 2022-10-04 HISTORY — PX: VAGINAL HYSTERECTOMY: SHX2639

## 2022-10-04 HISTORY — DX: Anemia, unspecified: D64.9

## 2022-10-04 HISTORY — DX: Other specified postprocedural states: R11.2

## 2022-10-04 HISTORY — DX: Other complications of anesthesia, initial encounter: T88.59XA

## 2022-10-04 HISTORY — DX: Family history of other specified conditions: Z84.89

## 2022-10-04 HISTORY — DX: Other specified postprocedural states: Z98.890

## 2022-10-04 HISTORY — DX: Chronic obstructive pulmonary disease, unspecified: J44.9

## 2022-10-04 LAB — GLUCOSE, CAPILLARY
Glucose-Capillary: 119 mg/dL — ABNORMAL HIGH (ref 70–99)
Glucose-Capillary: 133 mg/dL — ABNORMAL HIGH (ref 70–99)
Glucose-Capillary: 140 mg/dL — ABNORMAL HIGH (ref 70–99)
Glucose-Capillary: 151 mg/dL — ABNORMAL HIGH (ref 70–99)
Glucose-Capillary: 155 mg/dL — ABNORMAL HIGH (ref 70–99)
Glucose-Capillary: 166 mg/dL — ABNORMAL HIGH (ref 70–99)
Glucose-Capillary: 167 mg/dL — ABNORMAL HIGH (ref 70–99)
Glucose-Capillary: 170 mg/dL — ABNORMAL HIGH (ref 70–99)

## 2022-10-04 LAB — BASIC METABOLIC PANEL
Anion gap: 7 (ref 5–15)
BUN: 9 mg/dL (ref 6–20)
CO2: 25 mmol/L (ref 22–32)
Calcium: 8.6 mg/dL — ABNORMAL LOW (ref 8.9–10.3)
Chloride: 103 mmol/L (ref 98–111)
Creatinine, Ser: 0.78 mg/dL (ref 0.44–1.00)
GFR, Estimated: >60 mL/min (ref >60)
Glucose, Bld: 121 mg/dL — ABNORMAL HIGH (ref 70–99)
Potassium: 3.9 mmol/L (ref 3.5–5.1)
Sodium: 135 mmol/L (ref 135–145)

## 2022-10-04 LAB — CBC
HCT: 31.9 % — ABNORMAL LOW (ref 36.0–46.0)
Hemoglobin: 10.5 g/dL — ABNORMAL LOW (ref 12.0–15.0)
MCH: 28.2 pg (ref 26.0–34.0)
MCHC: 32.9 g/dL (ref 30.0–36.0)
MCV: 85.5 fL (ref 80.0–100.0)
Platelets: 230 10*3/uL (ref 150–400)
RBC: 3.73 MIL/uL — ABNORMAL LOW (ref 3.87–5.11)
RDW: 13.7 % (ref 11.5–15.5)
WBC: 4.9 10*3/uL (ref 4.0–10.5)
nRBC: 0 % (ref 0.0–0.2)

## 2022-10-04 LAB — TYPE AND SCREEN
ABO/RH(D): O POS
Antibody Screen: NEGATIVE

## 2022-10-04 LAB — HEMOGLOBIN AND HEMATOCRIT, BLOOD
HCT: 31.1 % — ABNORMAL LOW (ref 36.0–46.0)
Hemoglobin: 9.8 g/dL — ABNORMAL LOW (ref 12.0–15.0)

## 2022-10-04 LAB — POCT PREGNANCY, URINE: Preg Test, Ur: NEGATIVE

## 2022-10-04 SURGERY — HYSTERECTOMY, VAGINAL
Anesthesia: General | Site: Abdomen | Laterality: Bilateral

## 2022-10-04 MED ORDER — ATORVASTATIN CALCIUM 40 MG PO TABS
40.0000 mg | ORAL_TABLET | Freq: Every day | ORAL | Status: DC
  Administered 2022-10-04: 40 mg via ORAL
  Filled 2022-10-04 (×2): qty 1

## 2022-10-04 MED ORDER — CHLORHEXIDINE GLUCONATE 0.12 % MT SOLN
15.0000 mL | Freq: Once | OROMUCOSAL | Status: AC
  Administered 2022-10-04: 15 mL via OROMUCOSAL
  Filled 2022-10-04: qty 15

## 2022-10-04 MED ORDER — PHENYLEPHRINE HCL-NACL 20-0.9 MG/250ML-% IV SOLN
INTRAVENOUS | Status: DC | PRN
  Administered 2022-10-04: 20 ug/min via INTRAVENOUS

## 2022-10-04 MED ORDER — LACTATED RINGERS IV SOLN: INTRAVENOUS | Status: DC

## 2022-10-04 MED ORDER — GENTAMICIN SULFATE 40 MG/ML IJ SOLN
5.0000 mg/kg | INTRAVENOUS | Status: AC
  Administered 2022-10-04: 360 mg via INTRAVENOUS
  Filled 2022-10-04: qty 9

## 2022-10-04 MED ORDER — LIDOCAINE 2% (20 MG/ML) 5 ML SYRINGE
INTRAMUSCULAR | Status: DC | PRN
  Administered 2022-10-04: 60 mg via INTRAVENOUS

## 2022-10-04 MED ORDER — ACETAMINOPHEN 500 MG PO TABS
1000.0000 mg | ORAL_TABLET | ORAL | Status: AC
  Administered 2022-10-04: 1000 mg via ORAL
  Filled 2022-10-04: qty 2

## 2022-10-04 MED ORDER — LINAGLIPTIN 5 MG PO TABS
5.0000 mg | ORAL_TABLET | Freq: Every day | ORAL | Status: DC
  Administered 2022-10-05: 5 mg via ORAL
  Filled 2022-10-04: qty 1

## 2022-10-04 MED ORDER — ONDANSETRON HCL 4 MG PO TABS: 4.0000 mg | ORAL_TABLET | Freq: Four times a day (QID) | ORAL | Status: DC | PRN

## 2022-10-04 MED ORDER — ROCURONIUM BROMIDE 10 MG/ML (PF) SYRINGE
PREFILLED_SYRINGE | INTRAVENOUS | Status: AC
  Filled 2022-10-04: qty 10

## 2022-10-04 MED ORDER — SUGAMMADEX SODIUM 200 MG/2ML IV SOLN
INTRAVENOUS | Status: DC | PRN
  Administered 2022-10-04: 150 mg via INTRAVENOUS

## 2022-10-04 MED ORDER — FENTANYL CITRATE (PF) 250 MCG/5ML IJ SOLN
INTRAMUSCULAR | Status: DC | PRN
  Administered 2022-10-04: 100 ug via INTRAVENOUS
  Administered 2022-10-04: 50 ug via INTRAVENOUS

## 2022-10-04 MED ORDER — PROPOFOL 1000 MG/100ML IV EMUL
INTRAVENOUS | Status: AC
  Filled 2022-10-04: qty 200

## 2022-10-04 MED ORDER — POVIDONE-IODINE 10 % EX SWAB
2.0000 | Freq: Once | CUTANEOUS | Status: AC
  Administered 2022-10-04: 2 via TOPICAL

## 2022-10-04 MED ORDER — ONDANSETRON HCL 4 MG/2ML IJ SOLN
INTRAMUSCULAR | Status: DC | PRN
  Administered 2022-10-04: 4 mg via INTRAVENOUS

## 2022-10-04 MED ORDER — KETOROLAC TROMETHAMINE 30 MG/ML IJ SOLN
30.0000 mg | Freq: Four times a day (QID) | INTRAMUSCULAR | Status: DC
  Administered 2022-10-04 – 2022-10-05 (×2): 30 mg via INTRAVENOUS
  Filled 2022-10-04 (×4): qty 1

## 2022-10-04 MED ORDER — OXYCODONE-ACETAMINOPHEN 5-325 MG PO TABS: 1.0000 | ORAL_TABLET | ORAL | Status: DC | PRN

## 2022-10-04 MED ORDER — ROCURONIUM BROMIDE 10 MG/ML (PF) SYRINGE
PREFILLED_SYRINGE | INTRAVENOUS | Status: DC | PRN
  Administered 2022-10-04: 60 mg via INTRAVENOUS

## 2022-10-04 MED ORDER — ORAL CARE MOUTH RINSE: 15.0000 mL | Freq: Once | OROMUCOSAL | Status: AC

## 2022-10-04 MED ORDER — KETOROLAC TROMETHAMINE 15 MG/ML IJ SOLN
INTRAMUSCULAR | Status: DC | PRN
  Administered 2022-10-04: 15 mg via INTRAVENOUS

## 2022-10-04 MED ORDER — SOD CITRATE-CITRIC ACID 500-334 MG/5ML PO SOLN
30.0000 mL | ORAL | Status: AC
  Administered 2022-10-04: 30 mL via ORAL
  Filled 2022-10-04: qty 30

## 2022-10-04 MED ORDER — PROPOFOL 10 MG/ML IV BOLUS
INTRAVENOUS | Status: DC | PRN
  Administered 2022-10-04: 120 mg via INTRAVENOUS

## 2022-10-04 MED ORDER — DEXAMETHASONE SODIUM PHOSPHATE 10 MG/ML IJ SOLN
INTRAMUSCULAR | Status: DC | PRN
  Administered 2022-10-04: 4 mg via INTRAVENOUS

## 2022-10-04 MED ORDER — AMLODIPINE BESYLATE 5 MG PO TABS
5.0000 mg | ORAL_TABLET | Freq: Every day | ORAL | Status: DC
  Administered 2022-10-04 – 2022-10-05 (×2): 5 mg via ORAL
  Filled 2022-10-04 (×2): qty 1

## 2022-10-04 MED ORDER — METFORMIN HCL 500 MG PO TABS
1000.0000 mg | ORAL_TABLET | Freq: Every day | ORAL | Status: DC
  Administered 2022-10-05: 1000 mg via ORAL
  Filled 2022-10-04: qty 2

## 2022-10-04 MED ORDER — SITAGLIPTIN PHOS-METFORMIN HCL 50-1000 MG PO TABS
1.0000 | ORAL_TABLET | Freq: Every day | ORAL | Status: DC
  Filled 2022-10-04: qty 1

## 2022-10-04 MED ORDER — MIDAZOLAM HCL 2 MG/2ML IJ SOLN
INTRAMUSCULAR | Status: DC | PRN
  Administered 2022-10-04: 2 mg via INTRAVENOUS

## 2022-10-04 MED ORDER — PROPOFOL 10 MG/ML IV BOLUS
INTRAVENOUS | Status: AC
  Filled 2022-10-04: qty 20

## 2022-10-04 MED ORDER — HYDROMORPHONE HCL 1 MG/ML IJ SOLN
1.0000 mg | INTRAMUSCULAR | Status: DC | PRN
  Administered 2022-10-04 (×3): 1 mg via INTRAVENOUS
  Filled 2022-10-04 (×4): qty 1

## 2022-10-04 MED ORDER — ONDANSETRON HCL 4 MG/2ML IJ SOLN: 4.0000 mg | Freq: Four times a day (QID) | INTRAMUSCULAR | Status: DC | PRN

## 2022-10-04 MED ORDER — OXYCODONE-ACETAMINOPHEN 5-325 MG PO TABS
2.0000 | ORAL_TABLET | ORAL | Status: DC | PRN
  Administered 2022-10-05: 2 via ORAL
  Filled 2022-10-04: qty 2

## 2022-10-04 MED ORDER — DEXAMETHASONE SODIUM PHOSPHATE 10 MG/ML IJ SOLN
INTRAMUSCULAR | Status: AC
  Filled 2022-10-04: qty 1

## 2022-10-04 MED ORDER — FUROSEMIDE 10 MG/ML IJ SOLN
10.0000 mg | Freq: Once | INTRAMUSCULAR | Status: AC
  Administered 2022-10-04: 10 mg via INTRAVENOUS
  Filled 2022-10-04: qty 2

## 2022-10-04 MED ORDER — KETOROLAC TROMETHAMINE 15 MG/ML IJ SOLN
15.0000 mg | INTRAMUSCULAR | Status: AC
  Administered 2022-10-04: 15 mg via INTRAVENOUS
  Filled 2022-10-04: qty 1

## 2022-10-04 MED ORDER — LIDOCAINE 2% (20 MG/ML) 5 ML SYRINGE
INTRAMUSCULAR | Status: AC
  Filled 2022-10-04: qty 5

## 2022-10-04 MED ORDER — KETOROLAC TROMETHAMINE 30 MG/ML IJ SOLN
INTRAMUSCULAR | Status: AC
  Filled 2022-10-04: qty 1

## 2022-10-04 MED ORDER — EPHEDRINE 5 MG/ML INJ
INTRAVENOUS | Status: AC
  Filled 2022-10-04: qty 5

## 2022-10-04 MED ORDER — SENNA 8.6 MG PO TABS
1.0000 | ORAL_TABLET | Freq: Two times a day (BID) | ORAL | Status: DC
  Administered 2022-10-04: 8.6 mg via ORAL
  Filled 2022-10-04: qty 1

## 2022-10-04 MED ORDER — METRONIDAZOLE 500 MG/100ML IV SOLN
500.0000 mg | INTRAVENOUS | Status: AC
  Administered 2022-10-04: 500 mg via INTRAVENOUS
  Filled 2022-10-04: qty 100

## 2022-10-04 MED ORDER — FENTANYL CITRATE (PF) 250 MCG/5ML IJ SOLN
INTRAMUSCULAR | Status: AC
  Filled 2022-10-04: qty 5

## 2022-10-04 MED ORDER — KETAMINE HCL 50 MG/5ML IJ SOSY
PREFILLED_SYRINGE | INTRAMUSCULAR | Status: AC
  Filled 2022-10-04: qty 5

## 2022-10-04 MED ORDER — MIDAZOLAM HCL 2 MG/2ML IJ SOLN
INTRAMUSCULAR | Status: AC
  Filled 2022-10-04: qty 2

## 2022-10-04 MED ORDER — PHENYLEPHRINE HCL-NACL 20-0.9 MG/250ML-% IV SOLN
INTRAVENOUS | Status: AC
  Filled 2022-10-04: qty 500

## 2022-10-04 MED ORDER — INSULIN ASPART 100 UNIT/ML IJ SOLN
0.0000 [IU] | INTRAMUSCULAR | Status: DC
  Administered 2022-10-04 (×3): 3 [IU] via SUBCUTANEOUS
  Administered 2022-10-04 – 2022-10-05 (×2): 2 [IU] via SUBCUTANEOUS
  Administered 2022-10-05: 3 [IU] via SUBCUTANEOUS

## 2022-10-04 MED ORDER — 0.9 % SODIUM CHLORIDE (POUR BTL) OPTIME
TOPICAL | Status: DC | PRN
  Administered 2022-10-04: 1000 mL

## 2022-10-04 MED ORDER — PHENYLEPHRINE 80 MCG/ML (10ML) SYRINGE FOR IV PUSH (FOR BLOOD PRESSURE SUPPORT)
PREFILLED_SYRINGE | INTRAVENOUS | Status: AC
  Filled 2022-10-04: qty 10

## 2022-10-04 MED ORDER — IBUPROFEN 800 MG PO TABS: 800.0000 mg | ORAL_TABLET | Freq: Three times a day (TID) | ORAL | Status: DC

## 2022-10-04 MED ORDER — SODIUM CHLORIDE 0.9 % IV BOLUS
500.0000 mL | Freq: Once | INTRAVENOUS | Status: AC
  Administered 2022-10-04: 500 mL via INTRAVENOUS

## 2022-10-04 MED ORDER — ONDANSETRON HCL 4 MG/2ML IJ SOLN
INTRAMUSCULAR | Status: AC
  Filled 2022-10-04: qty 2

## 2022-10-04 MED ORDER — FENTANYL CITRATE (PF) 100 MCG/2ML IJ SOLN
25.0000 ug | INTRAMUSCULAR | Status: DC | PRN
  Administered 2022-10-04: 25 ug via INTRAVENOUS

## 2022-10-04 MED ORDER — PROPOFOL 1000 MG/100ML IV EMUL
INTRAVENOUS | Status: AC
  Filled 2022-10-04: qty 100

## 2022-10-04 MED ORDER — SIMETHICONE 80 MG PO CHEW: 80.0000 mg | CHEWABLE_TABLET | Freq: Four times a day (QID) | ORAL | Status: DC | PRN

## 2022-10-04 MED ORDER — METOCLOPRAMIDE HCL 5 MG/ML IJ SOLN
10.0000 mg | Freq: Four times a day (QID) | INTRAMUSCULAR | Status: DC | PRN
  Administered 2022-10-04: 10 mg via INTRAVENOUS
  Filled 2022-10-04: qty 2

## 2022-10-04 MED ORDER — KETAMINE HCL 10 MG/ML IJ SOLN
INTRAMUSCULAR | Status: DC | PRN
  Administered 2022-10-04: 20 mg via INTRAVENOUS

## 2022-10-04 MED ORDER — ONDANSETRON HCL 4 MG/2ML IJ SOLN
4.0000 mg | Freq: Four times a day (QID) | INTRAMUSCULAR | Status: DC | PRN
  Administered 2022-10-04: 4 mg via INTRAVENOUS
  Filled 2022-10-04: qty 2

## 2022-10-04 MED ORDER — ZOLPIDEM TARTRATE 5 MG PO TABS: 5.0000 mg | ORAL_TABLET | Freq: Every evening | ORAL | Status: DC | PRN

## 2022-10-04 MED ORDER — FENTANYL CITRATE (PF) 100 MCG/2ML IJ SOLN
INTRAMUSCULAR | Status: AC
  Filled 2022-10-04: qty 2

## 2022-10-04 SURGICAL SUPPLY — 31 items
BAG COUNTER SPONGE SURGICOUNT (BAG) ×1 IMPLANT
BAG SPNG CNTER NS LX DISP (BAG) ×1
BLADE SURG 10 STRL SS (BLADE) ×1 IMPLANT
GAUZE 4X4 16PLY ~~LOC~~+RFID DBL (SPONGE) ×2 IMPLANT
GLOVE BIO SURGEON STRL SZ7.5 (GLOVE) ×1 IMPLANT
GLOVE BIOGEL PI IND STRL 6.5 (GLOVE) IMPLANT
GLOVE BIOGEL PI IND STRL 7.0 (GLOVE) IMPLANT
GLOVE PROTEXIS LATEX SZ 7.5 (GLOVE) ×3 IMPLANT
GLOVE SURG LATEX 7.5 PF (GLOVE) IMPLANT
GLOVE SURG SS PI 7.0 STRL IVOR (GLOVE) IMPLANT
GLOVE SURG UNDER POLY LF SZ7 (GLOVE) ×1 IMPLANT
GOWN STRL REUS W/ TWL LRG LVL3 (GOWN DISPOSABLE) ×2 IMPLANT
GOWN STRL REUS W/ TWL XL LVL3 (GOWN DISPOSABLE) ×2 IMPLANT
GOWN STRL REUS W/TWL LRG LVL3 (GOWN DISPOSABLE) ×2
GOWN STRL REUS W/TWL XL LVL3 (GOWN DISPOSABLE) ×2
HIBICLENS CHG 4% 4OZ BTL (MISCELLANEOUS) ×1 IMPLANT
KIT TURNOVER KIT B (KITS) ×1 IMPLANT
MANIFOLD NEPTUNE II (INSTRUMENTS) ×1 IMPLANT
NS IRRIG 1000ML POUR BTL (IV SOLUTION) ×1 IMPLANT
PACK VAGINAL WOMENS (CUSTOM PROCEDURE TRAY) ×1 IMPLANT
PAD OB MATERNITY 4.3X12.25 (PERSONAL CARE ITEMS) ×1 IMPLANT
SUT VIC AB 2-0 CT1 18 (SUTURE) ×1 IMPLANT
SUT VIC AB 2-0 CT1 27 (SUTURE) ×1
SUT VIC AB 2-0 CT1 TAPERPNT 27 (SUTURE) ×1 IMPLANT
SUT VIC AB PLUS 45CM 1-MO-4 (SUTURE) ×2 IMPLANT
SUT VICRYL 1 TIES 12X18 (SUTURE) ×1 IMPLANT
TOWEL GREEN STERILE FF (TOWEL DISPOSABLE) ×1 IMPLANT
TRAY FOL W/BAG SLVR 16FR STRL (SET/KITS/TRAYS/PACK) IMPLANT
TRAY FOLEY W/BAG SLVR 14FR (SET/KITS/TRAYS/PACK) ×1 IMPLANT
TRAY FOLEY W/BAG SLVR 16FR LF (SET/KITS/TRAYS/PACK) ×1
UNDERPAD 30X36 HEAVY ABSORB (UNDERPADS AND DIAPERS) ×1 IMPLANT

## 2022-10-04 NOTE — Anesthesia Postprocedure Evaluation (Signed)
Anesthesia Post Note  Patient: Mackenzie Patterson  Procedure(s) Performed: HYSTERECTOMY VAGINAL WITH POSSIBLE SALPINGECTOMY (Bilateral: Abdomen)     Patient location during evaluation: PACU Anesthesia Type: General Level of consciousness: awake and alert Pain management: pain level controlled Vital Signs Assessment: post-procedure vital signs reviewed and stable Respiratory status: spontaneous breathing, nonlabored ventilation, respiratory function stable and patient connected to nasal cannula oxygen Cardiovascular status: blood pressure returned to baseline and stable Postop Assessment: no apparent nausea or vomiting Anesthetic complications: no   No notable events documented.  Last Vitals:  Vitals:   10/04/22 1231 10/04/22 1234  BP: (!) 88/41 (!) 99/50  Pulse: 80 78  Resp:    Temp:    SpO2:      Last Pain:  Vitals:   10/04/22 1039  TempSrc:   PainSc: Asleep                 Tammara Massing S

## 2022-10-04 NOTE — Op Note (Signed)
Mackenzie Patterson PROCEDURE DATE: 10/04/2022  PREOPERATIVE DIAGNOSIS:  Symptomatic fibroids, AUB POSTOPERATIVE DIAGNOSIS:  Symptomatic fibroids, AUB SURGEON:   Nettie Elm M.D. ASSISTANT: [place] Bing, M.D.  An experienced assistant was required given the standard of surgical care given the complexity of the case.  This assistant was needed for exposure, dissection, suctioning, retraction, and for overall help during the procedure.  OPERATION:  Total Vaginal hysterectomy with right salpingectomy ANESTHESIA:  General endotracheal.  INDICATIONS: The patient is a 52 y.o. [id number] with history of symptomatic uterine fibroids/menorrhagia. The patient made a decision to undergo definite surgical treatment. On the preoperative visit, the risks, benefits, indications, and alternatives of the procedure were reviewed with the patient.  On the day of surgery, the risks of surgery were again discussed with the patient including but not limited to: bleeding which may require transfusion or reoperation; infection which may require antibiotics; injury to bowel, bladder, ureters or other surrounding organs; need for additional procedures; thromboembolic phenomenon, incisional problems and other postoperative/anesthesia complications. Written informed consent was obtained.    OPERATIVE FINDINGS: A 12 week size uterus with normal tubes and ovaries on right, unable to see left tube and ovary  ESTIMATED BLOOD LOSS: 100 ml FLUIDS:  As recorded URINE OUTPUT:  As recorded SPECIMENS:  Uterus and cervix and right tube sent to pathology COMPLICATIONS:  None immediate.  DESCRIPTION OF PROCEDURE:  The patient received intravenous antibiotics and had sequential compression devices applied to her lower extremities while in the preoperative area.  She was then taken to the operating room where general anesthesia was administered and was found to be adequate.  She was placed in the dorsal lithotomy position, and was prepped and  draped in a sterile manner.  A Foley catheter was inserted into her bladder and attached to constant drainage. After an adequate timeout was performed, attention was turned to her pelvis.  A weighted speculum was then placed in the vagina, and the posterior lip of the cervix was grasped  with tenaculum.  The posterior cul de sac was then entered with sharp dissection without problems. Long bill weighted speculum was placed. The anterior lip of the cervix was then grasped with tenaculum.  The cervix was then circumferentially incised, and the bladder was dissected off the pubocervical fascia anteriorly without complication.  The anterior cul-de-sac was then entered sharply without difficulty and a retractor was placed. Zeplin  clamps were then used to clamp the uterosacral ligaments on either side.  Of note, all sutures used in this case were 0 Vicryl unless otherwise noted.   The cardinal ligaments were then clamped, cut and ligated. The uterine vessels and broad ligaments were then serially clamped with the Zeplin clamps, cut, and suture ligated on both sides.  Excellent hemostasis was noted at this point.  Due to the bulkiness of the uterus related to the uterine fibroids. The uterus was morcellated using a bivalve tenchique. This allowed the final pedicle on each side to be clamped, cut and ligated with a free tie and then in a Muniz fashion. The specimen was then passed off to pathology. The right tube was identified and clamped, cut and ligated with a free tie.The tube was passed off to pathology. After completion of the hysterectomy, all pedicles from the uterosacral ligament to the cornua were examined hemostasis was confirmed.  The peritoneal was closed with 2/0 Vicryl in a purse string fashion. The vaginal cuff was then closed with 2/0 Vicryl.  All instruments were then removed from the  pelvis   The patient tolerated the procedure well.  All instruments, needles, and sponge counts were correct x 2. The  patient was taken to the recovery room in stable condition.    Nettie Elm MD, FACOG Attending Obstetrician & Gynecologist Faculty Practice, [facility]

## 2022-10-04 NOTE — H&P (Signed)
Mackenzie Patterson Printup is an 52 y.o. female. With AUB secondary to uterine fibroids EMBX normal GYN U/S, uterine fibroids, 436 gms  H/O TSVD x 3 H/O BTL     Menstrual History: Menarche age: [age] Patient's last menstrual period was 09/27/2022.    Past Medical History:  Diagnosis Date   Anemia    iron deficency   Anginal pain    Asthma    Breast discharge 01/10/2017   Complication of anesthesia    Congenital ventricular septal defect    COPD (chronic obstructive pulmonary disease)    Diabetes mellitus without complication    Dyspnea    R/T heart defect   Family history of adverse reaction to anesthesia    father - n/v   Headache    Heart murmur    Hypertension    Obesity    PONV (postoperative nausea and vomiting)    Pulmonary hypertension    mild    Stroke 2023   TIA x9   Tricuspid regurgitation    VSD (ventricular septal defect), perimembranous    Qp:Qs 1.7:1 by catheterization     Past Surgical History:  Procedure Laterality Date   CARDIAC CATHETERIZATION [name] 10/30/2014   Procedure: Right Heart Cath;  Surgeon: Peter M SwazilandJordan, MD;  [facility];  Service: Cardiovascular;  Laterality: [name];   CHOLECYSTECTOMY  2018   CYST EXCISION     LEFT AND RIGHT HEART CATHETERIZATION WITH CORONARY ANGIOGRAM [name] 09/11/2013   Procedure: LEFT AND RIGHT HEART CATHETERIZATION WITH CORONARY ANGIOGRAM;  Surgeon: Micheline ChapmanMichael D Cooper, MD;  [facility];  Service: Cardiovascular;  Laterality: [name];   RIGHT HEART CATH [name] 09/02/2016   Procedure: Right Heart Cath;  Surgeon: Lyn RecordsHenry W Smith, MD;  [facility];  Service: Cardiovascular;  Laterality: [name];   TUBAL LIGATION      Family History  Problem Relation Age of Onset   Diabetes Mother    Diabetes Father    Cancer Other    Hyperlipidemia Other    Hypertension Other    Asthma Other    Breast cancer Neg Hx     Social History:  reports that she has never smoked. She has never used smokeless tobacco. She  reports that she does not currently use alcohol. She reports that she does not use drugs.  Allergies:  Allergies  Allergen Reactions   Penicillins Anaphylaxis    Has patient had a PCN reaction causing immediate rash, facial/tongue/throat swelling, SOB or lightheadedness with hypotension: Yes Has patient had a PCN reaction causing severe rash involving mucus membranes or skin necrosis: Yes Has patient had a PCN reaction that required hospitalization Yes- went to ED, was not admitted Has patient had a PCN reaction occurring within the last 10 years: No- longer then 10 years If all of the above answers are "NO", then may proceed with Cephalosporin use.    Tape Rash    Other reaction(s): Other (See Comments) Burns skin   Other Rash    Other reaction(s): Other (See Comments) **EKG GEL PADS**  "Burns my skin" Pt states she is allergic to a blood pressure medication, unsure.  **EKG GEL PADS**  "Burns my skin"   Drug Ingredient [Benzonatate] Other (See Comments) and Cough    Makes cough worse   Vitamin K Nausea And Vomiting   Latex Rash    Medications Prior to Admission  Medication Sig Dispense Refill Last Dose   amLODipine (NORVASC) 5 MG tablet Take 1 tablet (5  mg total) by mouth daily. 90 tablet 1 10/03/2022   atorvastatin (LIPITOR) 40 MG tablet Take 40 mg by mouth daily.   10/03/2022   cetirizine (ZYRTEC) 10 MG tablet Take 1 tablet (10 mg total) by mouth daily. At bedtime as needed 30 tablet 11 Past Week   JANUMET 50-1000 MG tablet TAKE 1 TABLET DAILY AFTER BREAKFAST 90 tablet 1 10/03/2022   Accu-Chek FastClix Lancets MISC USE 2 TIMES DAILY AS DIRECTED 100 each 11    acetaminophen (TYLENOL) 500 MG tablet Take 1,000 mg by mouth every 6 (six) hours as needed for moderate pain or mild pain.      acyclovir (ZOVIRAX) 400 MG tablet Take 1 tablet (400 mg total) by mouth 2 (two) times daily. 180 tablet 2 More than a month   albuterol (ACCUNEB) 0.63 MG/3ML nebulizer solution Take 1 ampule by  nebulization 3 (three) times daily as needed for wheezing or shortness of breath.   More than a month   albuterol (VENTOLIN HFA) 108 (90 Base) MCG/ACT inhaler Inhale 2 puffs into the lungs every 6 (six) hours as needed for wheezing or shortness of breath. 6.7 g 1 More than a month   clopidogrel (PLAVIX) 75 MG tablet Take 75 mg by mouth daily.   09/28/2022   fluticasone (FLONASE) 50 MCG/ACT nasal spray SPRAY 2 SPRAYS INTO EACH NOSTRIL EVERY DAY (Patient taking differently: Place 2 sprays into both nostrils daily as needed for allergies or rhinitis.) 16 mL 6 More than a month   glucose blood ([employer]S GLUCOSE METER TEST STRIPS) test strip Use as instructed 100 each 12    ibuprofen (ADVIL) 800 MG tablet Take 1 tablet (800 mg total) by mouth every 8 (eight) hours as needed. 30 tablet 5 More than a month   magic mouthwash (nystatin, lidocaine, diphenhydrAMINE, alum & mag hydroxide) suspension Swish and spit 5 mLs 3 (three) times daily as needed for mouth pain. 180 mL 0 More than a month   SYMBICORT 160-4.5 MCG/ACT inhaler INHALE 2 PUFFS INTO THE LUNGS TWICE A DAY 10.2 each 5 More than a month   triamcinolone cream (KENALOG) 0.1 % APPLY TO AFFECTED AREA TWICE A DAY 454 g 1    valACYclovir (VALTREX) 1000 MG tablet Take 1 tablet (1,000 mg total) by mouth 3 (three) times daily. 21 tablet 0 More than a month    Review of Systems  Constitutional: Negative.   Respiratory: Negative.    Cardiovascular: Negative.   Gastrointestinal: Negative.   Genitourinary:  Positive for menstrual problem.    Blood pressure 129/71, pulse 87, temperature 98.7 F (37.1 C), temperature source Oral, resp. rate 16, height 4\' 10"  (1.473 m), weight 70.8 kg, last menstrual period 09/27/2022, SpO2 99 %. Physical Exam Constitutional:      Appearance: Normal appearance.  Cardiovascular:     Rate and Rhythm: Normal rate and regular rhythm.  Pulmonary:     Effort: Pulmonary effort is normal.     Breath sounds: Normal breath sounds.   Abdominal:     General: Bowel sounds are normal.     Palpations: Abdomen is soft.  Genitourinary:    Comments: Nl EGBUS, uterus small, < 8 weeks, no masses    Results for orders placed or performed during the hospital encounter of 10/04/22 (from the past 24 hour(s))  Glucose, capillary     Status: Abnormal   Collection Time: 10/04/22  5:55 AM  Result Value Ref Range   Glucose-Capillary 119 (H) 70 - 99 mg/dL  Type and  screen  [facility]     Status: None   Collection Time: 10/04/22  5:59 AM  Result Value Ref Range   ABO/RH(D) O POS    Antibody Screen NEG    Sample Expiration      10/07/2022,2359 Performed at [facility], [address] [place], [place] [address]   CBC     Status: Abnormal   Collection Time: 10/04/22  6:06 AM  Result Value Ref Range   WBC 4.9 4.0 - 10.5 K/uL   RBC 3.73 (L) 3.87 - 5.11 MIL/uL   Hemoglobin 10.5 (L) 12.0 - 15.0 g/dL   HCT 94.5 (L) 03.8 - 88.2 %   MCV 85.5 80.0 - 100.0 fL   MCH 28.2 26.0 - 34.0 pg   MCHC 32.9 30.0 - 36.0 g/dL   RDW 80.0 34.9 - 17.9 %   Platelets 230 150 - 400 K/uL   nRBC 0.0 0.0 - 0.2 %  Basic metabolic panel     Status: Abnormal   Collection Time: 10/04/22  6:06 AM  Result Value Ref Range   Sodium 135 135 - 145 mmol/L   Potassium 3.9 3.5 - 5.1 mmol/L   Chloride 103 98 - 111 mmol/L   CO2 25 22 - 32 mmol/L   Glucose, Bld 121 (H) 70 - 99 mg/dL   BUN 9 6 - 20 mg/dL   Creatinine, Ser 1.50 0.44 - 1.00 mg/dL   Calcium 8.6 (L) 8.9 - 10.3 mg/dL   GFR, Estimated >56 >97 mL/min   Anion gap 7 5 - 15  Pregnancy, urine POC     Status: None   Collection Time: 10/04/22  6:16 AM  Result Value Ref Range   Preg Test, Ur NEGATIVE NEGATIVE    No results found.  Assessment/Plan: AUB Uterine fibroids  Pt desires definite therapy. TVH with possible BS reviewed with pt R/B/Post op care reviewed. Pt has received pre op clearance. Pt desires to proceed.  Hermina Staggers 10/04/2022, 7:20 AM

## 2022-10-04 NOTE — Anesthesia Procedure Notes (Signed)
Procedure Name: Intubation Date/Time: 10/04/2022 7:43 AM  Performed by: Alwyn Ren, CRNAPre-anesthesia Checklist: Patient identified, Emergency Drugs available, Suction available and Patient being monitored Patient Re-evaluated:Patient Re-evaluated prior to induction Oxygen Delivery Method: Circle system utilized Preoxygenation: Pre-oxygenation with 100% oxygen Induction Type: IV induction Ventilation: Mask ventilation without difficulty Laryngoscope Size: Miller and 2 Grade View: Grade I Tube type: Oral Tube size: 7.0 mm Number of attempts: 1 Airway Equipment and Method: Stylet and Oral airway Placement Confirmation: ETT inserted through vocal cords under direct vision, positive ETCO2 and breath sounds checked- equal and bilateral Secured at: 22 cm Tube secured with: Tape Dental Injury: Teeth and Oropharynx as per pre-operative assessment

## 2022-10-04 NOTE — Transfer of Care (Signed)
Immediate Anesthesia Transfer of Care Note  Patient: Mackenzie Patterson  Procedure(s) Performed: HYSTERECTOMY VAGINAL WITH POSSIBLE SALPINGECTOMY (Bilateral: Abdomen)  Patient Location: PACU  Anesthesia Type:General  Level of Consciousness: sedated  Airway & Oxygen Therapy: Patient Spontanous Breathing and Patient connected to face mask oxygen  Post-op Assessment: Report given to RN and Post -op Vital signs reviewed and stable  Post vital signs: Reviewed and stable  Last Vitals:  Vitals Value Taken Time  BP 107/56 10/04/22 0932  Temp 36.7 C 10/04/22 0932  Pulse 70 10/04/22 0935  Resp 15 10/04/22 0935  SpO2 100 % 10/04/22 0935  Vitals shown include unvalidated device data.  Last Pain:  Vitals:   10/04/22 0626  TempSrc:   PainSc: 0-No pain      Patients Stated Pain Goal: 0 (10/04/22 0626)  Complications: No notable events documented.

## 2022-10-05 ENCOUNTER — Encounter (HOSPITAL_COMMUNITY): Payer: Self-pay | Admitting: Obstetrics and Gynecology

## 2022-10-05 DIAGNOSIS — D259 Leiomyoma of uterus, unspecified: Secondary | ICD-10-CM | POA: Diagnosis not present

## 2022-10-05 LAB — GLUCOSE, CAPILLARY
Glucose-Capillary: 140 mg/dL — ABNORMAL HIGH (ref 70–99)
Glucose-Capillary: 161 mg/dL — ABNORMAL HIGH (ref 70–99)

## 2022-10-05 LAB — BASIC METABOLIC PANEL
Anion gap: 8 (ref 5–15)
BUN: 9 mg/dL (ref 6–20)
CO2: 23 mmol/L (ref 22–32)
Calcium: 8.6 mg/dL — ABNORMAL LOW (ref 8.9–10.3)
Chloride: 104 mmol/L (ref 98–111)
Creatinine, Ser: 0.85 mg/dL (ref 0.44–1.00)
GFR, Estimated: >60 mL/min (ref >60)
Glucose, Bld: 171 mg/dL — ABNORMAL HIGH (ref 70–99)
Potassium: 4.2 mmol/L (ref 3.5–5.1)
Sodium: 135 mmol/L (ref 135–145)

## 2022-10-05 LAB — CBC
HCT: 26.9 % — ABNORMAL LOW (ref 36.0–46.0)
Hemoglobin: 8.5 g/dL — ABNORMAL LOW (ref 12.0–15.0)
MCH: 26.9 pg (ref 26.0–34.0)
MCHC: 31.6 g/dL (ref 30.0–36.0)
MCV: 85.1 fL (ref 80.0–100.0)
Platelets: 235 10*3/uL (ref 150–400)
RBC: 3.16 MIL/uL — ABNORMAL LOW (ref 3.87–5.11)
RDW: 13.8 % (ref 11.5–15.5)
WBC: 11.6 10*3/uL — ABNORMAL HIGH (ref 4.0–10.5)
nRBC: 0 % (ref 0.0–0.2)

## 2022-10-05 MED ORDER — OXYCODONE-ACETAMINOPHEN 5-325 MG PO TABS: 1.0000 | ORAL_TABLET | ORAL | 0 refills | Status: DC | PRN

## 2022-10-05 MED ORDER — IBUPROFEN 800 MG PO TABS: 800.0000 mg | ORAL_TABLET | Freq: Three times a day (TID) | ORAL | 1 refills | Status: DC

## 2022-10-05 MED ORDER — METOCLOPRAMIDE HCL 10 MG PO TABS: 10.0000 mg | ORAL_TABLET | Freq: Four times a day (QID) | ORAL | 1 refills | Status: DC | PRN

## 2022-10-05 NOTE — Discharge Summary (Signed)
Physician Discharge Summary  [name] MRN: [id number] DOB/AGE: 09-24-1970 52 y.o.  Admit date: 10/04/2022 Discharge date: 10/05/2022  Admission Diagnoses: Uterine fibroids  Discharge Diagnoses:  Principal Problem:   Post-operative state   Discharged Condition: good  Hospital Course: Mackenzie Patterson was admitted with above Dx.See admit H & P for additional information She underwent TVH with BS on 10/04/22 without problems.See OP note for additional information. Post op course was unremarkable. She progressed to ambulating, voiding, tolerating diet and good oral pain control. Felt amendable for discharge home. Discharge instructions, medications and follow up reviewed with pt. She verbalized understanding  Consults: None  Significant Diagnostic Studies: labs  Treatments: surgery: TVH/BS  Discharge Exam: Blood pressure (!) 114/53, pulse 71, temperature 98.4 F (36.9 C), temperature source Oral, resp. rate 18, height 4\' 10"  (1.473 m), weight 70.8 kg, last menstrual period 09/27/2022, SpO2 96 %. Lungs clear Heart RRR Abd soft + BS GU no bleeding Ext non tender  Disposition: Discharge disposition: 01-Home or Self Care       Discharge Instructions     Call MD for:  difficulty breathing, headache or visual disturbances   Complete by: As directed    Call MD for:  extreme fatigue   Complete by: As directed    Call MD for:  hives   Complete by: As directed    Call MD for:  persistant dizziness or light-headedness   Complete by: As directed    Call MD for:  persistant nausea and vomiting   Complete by: As directed    Call MD for:  redness, tenderness, or signs of infection (pain, swelling, redness, odor or green/yellow discharge around incision site)   Complete by: As directed    Call MD for:  severe uncontrolled pain   Complete by: As directed    Call MD for:  temperature >100.4   Complete by: As directed    Diet - low sodium heart healthy   Complete by: As  directed    Increase activity slowly   Complete by: As directed    Sexual Activity Restrictions   Complete by: As directed    Pelvic rest x 4 weeks      Allergies as of 10/05/2022       Reactions   Penicillins Anaphylaxis   Has patient had a PCN reaction causing immediate rash, facial/tongue/throat swelling, SOB or lightheadedness with hypotension: Yes Has patient had a PCN reaction causing severe rash involving mucus membranes or skin necrosis: Yes Has patient had a PCN reaction that required hospitalization Yes- went to ED, was not admitted Has patient had a PCN reaction occurring within the last 10 years: No- longer then 10 years If all of the above answers are "NO", then may proceed with Cephalosporin use.   Tape Rash   Other reaction(s): Other (See Comments) Burns skin   Other Rash   Other reaction(s): Other (See Comments) **EKG GEL PADS**  "Burns my skin" Pt states she is allergic to a blood pressure medication, unsure.  **EKG GEL PADS**  "Burns my skin"   Drug Ingredient [benzonatate] Other (See Comments), Cough   Makes cough worse   Vitamin K Nausea And Vomiting   Latex Rash        Medication List     STOP taking these medications    acetaminophen 500 MG tablet Commonly known as: TYLENOL       TAKE these medications    Accu-Chek FastClix Lancets Misc USE 2 TIMES DAILY AS  DIRECTED   acyclovir 400 MG tablet Commonly known as: Zovirax Take 1 tablet (400 mg total) by mouth 2 (two) times daily.   albuterol 108 (90 Base) MCG/ACT inhaler Commonly known as: VENTOLIN HFA Inhale 2 puffs into the lungs every 6 (six) hours as needed for wheezing or shortness of breath.   albuterol 0.63 MG/3ML nebulizer solution Commonly known as: ACCUNEB Take 1 ampule by nebulization 3 (three) times daily as needed for wheezing or shortness of breath.   amLODipine 5 MG tablet Commonly known as: NORVASC Take 1 tablet (5 mg total) by mouth daily.   atorvastatin 40 MG  tablet Commonly known as: LIPITOR Take 40 mg by mouth daily.   cetirizine 10 MG tablet Commonly known as: ZYRTEC Take 1 tablet (10 mg total) by mouth daily. At bedtime as needed   clopidogrel 75 MG tablet Commonly known as: PLAVIX Take 75 mg by mouth daily.   CVS Glucose Meter Test Strips test strip Generic drug: glucose blood Use as instructed   fluticasone 50 MCG/ACT nasal spray Commonly known as: FLONASE SPRAY 2 SPRAYS INTO EACH NOSTRIL EVERY DAY What changed: See the new instructions.   ibuprofen 800 MG tablet Commonly known as: ADVIL Take 1 tablet (800 mg total) by mouth 3 (three) times daily. What changed:  when to take this reasons to take this   Janumet 50-1000 MG tablet Generic drug: sitaGLIPtin-metformin TAKE 1 TABLET DAILY AFTER BREAKFAST   magic mouthwash (nystatin, lidocaine, diphenhydrAMINE, alum & mag hydroxide) suspension Swish and spit 5 mLs 3 (three) times daily as needed for mouth pain.   metoCLOPramide 10 MG tablet Commonly known as: REGLAN Take 1 tablet (10 mg total) by mouth every 6 (six) hours as needed for nausea or vomiting.   oxyCODONE-acetaminophen 5-325 MG tablet Commonly known as: PERCOCET/ROXICET Take 1 tablet by mouth every 4 (four) hours as needed for moderate pain.   Symbicort 160-4.5 MCG/ACT inhaler Generic drug: budesonide-formoterol INHALE 2 PUFFS INTO THE LUNGS TWICE A DAY   triamcinolone cream 0.1 % Commonly known as: KENALOG APPLY TO AFFECTED AREA TWICE A DAY   valACYclovir 1000 MG tablet Commonly known as: VALTREX Take 1 tablet (1,000 mg total) by mouth 3 (three) times daily.        Follow-up Information     [facility]. Schedule an appointment as soon as possible for a visit in 4 week(s).   Why: Face to face post op appt with Dr. Austin Miles Contact information: [address] [place] [address] [phone]                Signed: Hermina Staggers 10/05/2022,  8:48 AM

## 2022-10-05 NOTE — Progress Notes (Addendum)
NT attempted orthostatic vitals at 2349. Patient tolerated well until asked to stand for a couple minutes. Patient stated she felt dizzy and nauseous, so vitals for standing at 3 minutes were not completed. Patient could have Zofran for the nausea, but that had not worked for her earlier in the day, so she said she would wait until she could have Reglan again. Vitals were stable for all orthostatics that could be completed. Will attempt again in the morning before the foley comes out.  4/10 0428: This RN attempted to complete orthostatic vitals at this time. Patient was able to stand in place for 3 minutes with no complaints. BP at that time was 94/53. Patient was asymptomatic. Patient successfully ambulated to and from bathroom with just stand-by assist from this RN. Foley removed and RN instructed patient to call out first time needing to get up and use the restroom to assure patient would not get dizzy again and could safely ambulate to bathroom.

## 2022-10-05 NOTE — Plan of Care (Signed)
Pt discharged home with printed instructions. Lorren Rossetti L Hadar Elgersma, RN  

## 2022-10-06 LAB — SURGICAL PATHOLOGY

## 2022-10-08 ENCOUNTER — Other Ambulatory Visit (INDEPENDENT_AMBULATORY_CARE_PROVIDER_SITE_OTHER): Payer: Self-pay | Admitting: Primary Care

## 2022-10-08 DIAGNOSIS — L309 Dermatitis, unspecified: Secondary | ICD-10-CM

## 2022-10-08 DIAGNOSIS — J309 Allergic rhinitis, unspecified: Secondary | ICD-10-CM

## 2022-10-08 DIAGNOSIS — L500 Allergic urticaria: Secondary | ICD-10-CM

## 2022-10-10 NOTE — Telephone Encounter (Signed)
Requested medication (s) are due for refill today: routing for review  Requested medication (s) are on the active medication list: yes  Last refill:  05/01/21  Future visit scheduled: no  Notes to clinic:  Unable to refill per protocol, another provider listed as PCP, routing for approval.     Requested Prescriptions  Pending Prescriptions Disp Refills   triamcinolone cream (KENALOG) 0.1 % [Pharmacy Med Name: TRIAMCINOLONE 0.1% CREAM] 454 g 1    Sig: APPLY TO AFFECTED AREA TWICE A DAY     Not Delegated - Dermatology:  Corticosteroids Failed - 10/08/2022  9:31 AM      Failed - This refill cannot be delegated      Passed - Valid encounter within last 12 months    Recent Outpatient Visits           7 months ago Follow-up exam   [place] [facility] Grayce Sessions, NP   1 year ago Allergic urticaria   [place] [facility] Grayce Sessions, NP   1 year ago Type 2 diabetes mellitus without complication, without long-term current use of insulin (HCC)   [place] [facility] Grayce Sessions, NP   1 year ago Nostril sore   [place] [facility] Grayce Sessions, NP   2 years ago Rash and nonspecific skin eruption   [place] [facility] Grayce Sessions, NP               cetirizine (ZYRTEC) 10 MG tablet [Pharmacy Med Name: CETIRIZINE HCL 10 MG TABLET] 30 tablet 11    Sig: Take 1 tablet (10 mg total) by mouth daily. At bedtime as needed     Ear, Nose, and Throat:  Antihistamines 2 Passed - 10/08/2022  9:31 AM      Passed - Cr in normal range and within 360 days    Creat  Date Value Ref Range Status  02/21/2017 0.73 0.50 - 1.10 mg/dL Final   Creatinine, Ser  Date Value Ref Range Status  10/05/2022 0.85 0.44 - 1.00 mg/dL Final         Passed - Valid encounter within last 12 months    Recent Outpatient Visits           7 months ago Follow-up exam   [place]  [facility] Grayce Sessions, NP   1 year ago Allergic urticaria   [place] [facility] Grayce Sessions, NP   1 year ago Type 2 diabetes mellitus without complication, without long-term current use of insulin (HCC)   [place] [facility] Grayce Sessions, NP   1 year ago Nostril sore   [place] [facility] Grayce Sessions, NP   2 years ago Rash and nonspecific skin eruption   [place] [facility] Grayce Sessions, NP

## 2022-10-11 ENCOUNTER — Other Ambulatory Visit (INDEPENDENT_AMBULATORY_CARE_PROVIDER_SITE_OTHER): Payer: Self-pay | Admitting: Primary Care

## 2022-10-17 DIAGNOSIS — L659 Nonscarring hair loss, unspecified: Secondary | ICD-10-CM | POA: Diagnosis not present

## 2022-10-17 DIAGNOSIS — D500 Iron deficiency anemia secondary to blood loss (chronic): Secondary | ICD-10-CM | POA: Diagnosis not present

## 2022-11-01 ENCOUNTER — Encounter: Payer: Self-pay | Admitting: Obstetrics and Gynecology

## 2022-11-01 ENCOUNTER — Ambulatory Visit (INDEPENDENT_AMBULATORY_CARE_PROVIDER_SITE_OTHER): Payer: Medicaid Other | Admitting: Obstetrics and Gynecology

## 2022-11-01 VITALS — BP 114/74 | HR 85 | Ht <= 58 in | Wt 158.0 lb

## 2022-11-01 DIAGNOSIS — Z9079 Acquired absence of other genital organ(s): Secondary | ICD-10-CM

## 2022-11-01 DIAGNOSIS — Z90710 Acquired absence of both cervix and uterus: Secondary | ICD-10-CM | POA: Insufficient documentation

## 2022-11-01 NOTE — Progress Notes (Signed)
52 y.o GYN presents for Post Op FU.  C/o pain 3/10.

## 2022-11-01 NOTE — Progress Notes (Signed)
Mackenzie Patterson presents for post op appt. S/P TVH with right salpingectomy last month Pathology reviewed with pt Pt denies any bowel or bladder   PE AF VSS Chaperone present  Lungs clear Heart RRR Abd soft + BS Gu Nl EGBUS, cuff healing well, no masses or tenderness  A/P Post op TVH/right salpingectomy  Doing well. Return to nl ADL's as tolerates

## 2022-11-28 ENCOUNTER — Other Ambulatory Visit: Payer: Self-pay

## 2022-11-28 ENCOUNTER — Emergency Department (HOSPITAL_BASED_OUTPATIENT_CLINIC_OR_DEPARTMENT_OTHER)
Admission: EM | Admit: 2022-11-28 | Discharge: 2022-11-28 | Disposition: A | Discharge status: Home / Self Care | Payer: Medicaid Other | Attending: Emergency Medicine | Admitting: Emergency Medicine

## 2022-11-28 ENCOUNTER — Other Ambulatory Visit (HOSPITAL_BASED_OUTPATIENT_CLINIC_OR_DEPARTMENT_OTHER): Payer: Self-pay

## 2022-11-28 ENCOUNTER — Encounter (HOSPITAL_BASED_OUTPATIENT_CLINIC_OR_DEPARTMENT_OTHER): Payer: Self-pay

## 2022-11-28 DIAGNOSIS — Z91040 Latex allergy status: Secondary | ICD-10-CM | POA: Diagnosis not present

## 2022-11-28 DIAGNOSIS — Z79899 Other long term (current) drug therapy: Secondary | ICD-10-CM | POA: Diagnosis not present

## 2022-11-28 DIAGNOSIS — Z90710 Acquired absence of both cervix and uterus: Secondary | ICD-10-CM | POA: Diagnosis not present

## 2022-11-28 DIAGNOSIS — Z7984 Long term (current) use of oral hypoglycemic drugs: Secondary | ICD-10-CM | POA: Insufficient documentation

## 2022-11-28 DIAGNOSIS — Z7902 Long term (current) use of antithrombotics/antiplatelets: Secondary | ICD-10-CM | POA: Insufficient documentation

## 2022-11-28 DIAGNOSIS — E119 Type 2 diabetes mellitus without complications: Secondary | ICD-10-CM | POA: Diagnosis not present

## 2022-11-28 DIAGNOSIS — N939 Abnormal uterine and vaginal bleeding, unspecified: Secondary | ICD-10-CM | POA: Diagnosis not present

## 2022-11-28 DIAGNOSIS — I10 Essential (primary) hypertension: Secondary | ICD-10-CM | POA: Insufficient documentation

## 2022-11-28 LAB — CBC
HCT: 39.6 % (ref 36.0–46.0)
Hemoglobin: 12.4 g/dL (ref 12.0–15.0)
MCH: 24.3 pg — ABNORMAL LOW (ref 26.0–34.0)
MCHC: 31.3 g/dL (ref 30.0–36.0)
MCV: 77.6 fL — ABNORMAL LOW (ref 80.0–100.0)
Platelets: 253 10*3/uL (ref 150–400)
RBC: 5.1 MIL/uL (ref 3.87–5.11)
RDW: 16.1 % — ABNORMAL HIGH (ref 11.5–15.5)
WBC: 5.2 10*3/uL (ref 4.0–10.5)
nRBC: 0 % (ref 0.0–0.2)

## 2022-11-28 LAB — BASIC METABOLIC PANEL
Anion gap: 8 (ref 5–15)
BUN: 6 mg/dL (ref 6–20)
CO2: 26 mmol/L (ref 22–32)
Calcium: 10.1 mg/dL (ref 8.9–10.3)
Chloride: 103 mmol/L (ref 98–111)
Creatinine, Ser: 0.79 mg/dL (ref 0.44–1.00)
GFR, Estimated: >60 mL/min (ref >60)
Glucose, Bld: 129 mg/dL — ABNORMAL HIGH (ref 70–99)
Potassium: 4 mmol/L (ref 3.5–5.1)
Sodium: 137 mmol/L (ref 135–145)

## 2022-11-28 LAB — WET PREP, GENITAL
Clue Cells Wet Prep HPF POC: NONE SEEN
Sperm: NONE SEEN
Trich, Wet Prep: NONE SEEN
WBC, Wet Prep HPF POC: <10 (ref <10)
Yeast Wet Prep HPF POC: NONE SEEN

## 2022-11-28 NOTE — Discharge Instructions (Signed)
You were seen for your vaginal bleeding after your hysterectomy in the emergency department.   Check your MyChart online for the results of any tests that had not resulted by the time you left the emergency department.   Follow-up with your OB/GYN in 1 to 2 weeks regarding your visit.    Return immediately to the emergency department if you experience any of the following: Vaginal bleeding where you are using more than 2 pads an hour for 2 hours, dizziness, shortness of breath, or any other concerning symptoms.    Thank you for visiting our Emergency Department. It was a pleasure taking care of you today.

## 2022-11-28 NOTE — ED Provider Notes (Signed)
[place] EMERGENCY DEPARTMENT AT [facility] Provider Note   CSN: [id number] Arrival date & time: 11/28/22  1308     History {Add pertinent medical, surgical, social history, OB history to HPI:1} No chief complaint on file.   Mackenzie Patterson is a 52 y.o. female.  52 year old female with a history of hypertension, tricuspid regurgitation/VSD, diabetes, TIA on Plavix and fibroids status post TVH/right salpingectomy on 10/04/2022 who presents emergency department vaginal bleeding.  Since the patient's operation she reports that she has been doing well.  Today did pass a clot from her vagina and has had some small amount of spotting since then.  Denies any dizziness, shortness of breath, fatigue.  Not on any blood thinners aside from her Plavix for TIAs.  No sexual intercourse recently.  Has had some thin white vaginal discharge. No abdominal pain.       Home Medications Prior to Admission medications   Medication Sig Start Date End Date Taking? Authorizing Provider  Accu-Chek FastClix Lancets MISC USE 2 TIMES DAILY AS DIRECTED 07/26/19   Grayce Sessions, NP  acyclovir (ZOVIRAX) 400 MG tablet Take 1 tablet (400 mg total) by mouth 2 (two) times daily. 04/27/22   Brock Bad, MD  albuterol (ACCUNEB) 0.63 MG/3ML nebulizer solution Take 1 ampule by nebulization 3 (three) times daily as needed for wheezing or shortness of breath. 08/05/22   [provider]  albuterol (VENTOLIN HFA) 108 (90 Base) MCG/ACT inhaler Inhale 2 puffs into the lungs every 6 (six) hours as needed for wheezing or shortness of breath. 01/27/20   Grayce Sessions, NP  amLODipine (NORVASC) 5 MG tablet Take 1 tablet (5 mg total) by mouth daily. 03/24/21   Grayce Sessions, NP  atorvastatin (LIPITOR) 40 MG tablet Take 40 mg by mouth daily. 02/14/21   [provider]  cetirizine (ZYRTEC) 10 MG tablet Take 1 tablet (10 mg total) by mouth daily. At bedtime as needed 05/06/21   Grayce Sessions, NP   clopidogrel (PLAVIX) 75 MG tablet Take 75 mg by mouth daily.    [provider]  fluticasone (FLONASE) 50 MCG/ACT nasal spray SPRAY 2 SPRAYS INTO EACH NOSTRIL EVERY DAY Patient taking differently: Place 2 sprays into both nostrils daily as needed for allergies or rhinitis. 03/25/21   Grayce Sessions, NP  glucose blood ([employer] GLUCOSE METER TEST STRIPS) test strip Use as instructed 07/30/19   Grayce Sessions, NP  ibuprofen (ADVIL) 800 MG tablet Take 1 tablet (800 mg total) by mouth 3 (three) times daily. 10/05/22   Hermina Staggers, MD  JANUMET 50-1000 MG tablet TAKE 1 TABLET DAILY AFTER BREAKFAST 08/30/21   Grayce Sessions, NP  magic mouthwash (nystatin, lidocaine, diphenhydrAMINE, alum & mag hydroxide) suspension Swish and spit 5 mLs 3 (three) times daily as needed for mouth pain. 05/21/22   Ernie Avena, MD  metoCLOPramide (REGLAN) 10 MG tablet Take 1 tablet (10 mg total) by mouth every 6 (six) hours as needed for nausea or vomiting. 10/05/22   Hermina Staggers, MD  oxyCODONE-acetaminophen (PERCOCET/ROXICET) 5-325 MG tablet Take 1 tablet by mouth every 4 (four) hours as needed for moderate pain. 10/05/22   Hermina Staggers, MD  SYMBICORT 160-4.5 MCG/ACT inhaler INHALE 2 PUFFS INTO THE LUNGS TWICE A DAY 03/09/21   Grayce Sessions, NP  triamcinolone cream (KENALOG) 0.1 % APPLY TO AFFECTED AREA TWICE A DAY 05/01/21   Grayce Sessions, NP  valACYclovir (VALTREX) 1000 MG tablet Take 1 tablet (  1,000 mg total) by mouth 3 (three) times daily. 05/21/22   Ernie Avena, MD      Allergies    Penicillins, Tape, Other, Drug ingredient [benzonatate], Vitamin k, and Latex    Review of Systems   Review of Systems  Physical Exam Updated Vital Signs There were no vitals taken for this visit. Physical Exam Vitals and nursing note reviewed.  Constitutional:      General: She is not in acute distress.    Appearance: She is well-developed.  HENT:     Head: Normocephalic and atraumatic.      Right Ear: External ear normal.     Left Ear: External ear normal.     Nose: Nose normal.  Eyes:     Extraocular Movements: Extraocular movements intact.     Conjunctiva/sclera: Conjunctivae normal.     Pupils: Pupils are equal, round, and reactive to light.  Cardiovascular:     Rate and Rhythm: Normal rate and regular rhythm.  Pulmonary:     Effort: Pulmonary effort is normal. No respiratory distress.  Abdominal:     General: Abdomen is flat. There is no distension.     Palpations: Abdomen is soft. There is no mass.     Tenderness: There is no abdominal tenderness. There is no guarding.  Genitourinary:    Comments: Chaperoned by Stanford Breed.  External genitalia unremarkable. Nor rashes or lesions noted.  Speculum exam with normal appearing whitish vaginal discharge and scant amount of blood.  Vaginal wall mucosa is unremarkable.  Endovaginal cuff without any significant abnormality. Musculoskeletal:     Cervical back: Normal range of motion and neck supple.     Right lower leg: No edema.     Left lower leg: No edema.  Skin:    General: Skin is warm and dry.  Neurological:     Mental Status: She is alert and oriented to person, place, and time. Mental status is at baseline.  Psychiatric:        Mood and Affect: Mood normal.     ED Results / Procedures / Treatments   Labs (all labs ordered are listed, but only abnormal results are displayed) Labs Reviewed - No data to display  EKG None  Radiology No results found.  Procedures Procedures  {Document cardiac monitor, telemetry assessment procedure when appropriate:1}  Medications Ordered in ED Medications - No data to display  ED Course/ Medical Decision Making/ A&P Clinical Course as of 11/28/22 1049  Mon Nov 28, 2022  1027 Dr Alysia Penna contacted regarding the patient's visit and bleeding and recommends follow-up in clinic in 1 to 2 weeks. [RP]    Clinical Course User Index [RP] Rondel Baton, MD   {    Click here for ABCD2, HEART and other calculatorsREFRESH Note before signing :1}                          Medical Decision Making Amount and/or Complexity of Data Reviewed Labs: ordered.   Dasja Brierly is a 52 y.o. female with comorbidities that complicate the patient evaluation including ***   Initial Ddx:  ***   MDM:  ***  Plan:  ***  ED Summary/Re-evaluation:  ***  This patient presents to the ED for concern of complaints listed in HPI, this involves an extensive number of treatment options, and is a complaint that carries with it a high risk of complications and morbidity. Disposition including potential need for admission considered.  Dispo: {Disposition:28069}  Additional history obtained from {Additional History:28067} Records reviewed {Records Reviewed:28068} The following labs were independently interpreted: {labs interpreted:28064} and show {lab findings:28250} I independently reviewed the following imaging with scope of interpretation limited to determining acute life threatening conditions related to emergency care: {imaging interpreted:28065} and agree with the radiologist interpretation with the following exceptions: none I personally reviewed and interpreted cardiac monitoring: {cardiac monitoring:28251} I personally reviewed and interpreted the pt's EKG: see above for interpretation  I have reviewed the patients home medications and made adjustments as needed Consults: {Consultants:28063} Social Determinants of health:  ***   {Document critical care time when appropriate:1} {Document review of labs and clinical decision tools ie heart score, Chads2Vasc2 etc:1}  {Document your independent review of radiology images, and any outside records:1} {Document your discussion with family members, caretakers, and with consultants:1} {Document social determinants of health affecting pt's care:1} {Document your decision making why or why not admission, treatments were  needed:1} Final Clinical Impression(s) / ED Diagnoses Final diagnoses:  None    Rx / DC Orders ED Discharge Orders     None

## 2022-11-28 NOTE — ED Triage Notes (Signed)
Onset today of vaginal bleeding.  Had hysterectomy on April 7.  Bleeding with clots started this am

## 2022-11-29 LAB — GC/CHLAMYDIA PROBE AMP (~~LOC~~) NOT AT ARMC
Chlamydia: NEGATIVE
Comment: NEGATIVE
Comment: NORMAL
Neisseria Gonorrhea: NEGATIVE

## 2022-12-01 DIAGNOSIS — E119 Type 2 diabetes mellitus without complications: Secondary | ICD-10-CM | POA: Diagnosis not present

## 2022-12-01 DIAGNOSIS — R21 Rash and other nonspecific skin eruption: Secondary | ICD-10-CM | POA: Diagnosis not present

## 2022-12-01 DIAGNOSIS — Q210 Ventricular septal defect: Secondary | ICD-10-CM | POA: Diagnosis not present

## 2023-02-28 DIAGNOSIS — F419 Anxiety disorder, unspecified: Secondary | ICD-10-CM | POA: Diagnosis not present

## 2023-02-28 DIAGNOSIS — R002 Palpitations: Secondary | ICD-10-CM | POA: Diagnosis not present

## 2023-02-28 DIAGNOSIS — Q210 Ventricular septal defect: Secondary | ICD-10-CM | POA: Diagnosis not present

## 2023-03-25 DIAGNOSIS — R002 Palpitations: Secondary | ICD-10-CM | POA: Diagnosis not present

## 2023-04-05 ENCOUNTER — Encounter (HOSPITAL_BASED_OUTPATIENT_CLINIC_OR_DEPARTMENT_OTHER): Payer: Self-pay

## 2023-04-05 ENCOUNTER — Other Ambulatory Visit: Payer: Self-pay

## 2023-04-05 DIAGNOSIS — I10 Essential (primary) hypertension: Secondary | ICD-10-CM | POA: Insufficient documentation

## 2023-04-05 DIAGNOSIS — R911 Solitary pulmonary nodule: Secondary | ICD-10-CM | POA: Insufficient documentation

## 2023-04-05 DIAGNOSIS — Z7901 Long term (current) use of anticoagulants: Secondary | ICD-10-CM | POA: Diagnosis not present

## 2023-04-05 DIAGNOSIS — Z79899 Other long term (current) drug therapy: Secondary | ICD-10-CM | POA: Diagnosis not present

## 2023-04-05 DIAGNOSIS — Z91040 Latex allergy status: Secondary | ICD-10-CM | POA: Insufficient documentation

## 2023-04-05 DIAGNOSIS — E1165 Type 2 diabetes mellitus with hyperglycemia: Secondary | ICD-10-CM | POA: Diagnosis not present

## 2023-04-05 DIAGNOSIS — J029 Acute pharyngitis, unspecified: Secondary | ICD-10-CM | POA: Insufficient documentation

## 2023-04-05 LAB — GROUP A STREP BY PCR: Group A Strep by PCR: NOT DETECTED

## 2023-04-05 NOTE — ED Triage Notes (Signed)
Pt states that she has had a sore throat for the past 2 days with no fevers.

## 2023-04-06 ENCOUNTER — Emergency Department (HOSPITAL_BASED_OUTPATIENT_CLINIC_OR_DEPARTMENT_OTHER)
Admission: EM | Admit: 2023-04-06 | Discharge: 2023-04-06 | Disposition: A | Discharge status: Home / Self Care | Payer: Medicaid Other | Attending: Emergency Medicine | Admitting: Emergency Medicine

## 2023-04-06 ENCOUNTER — Emergency Department (HOSPITAL_BASED_OUTPATIENT_CLINIC_OR_DEPARTMENT_OTHER): Payer: Medicaid Other

## 2023-04-06 DIAGNOSIS — R739 Hyperglycemia, unspecified: Secondary | ICD-10-CM

## 2023-04-06 DIAGNOSIS — R911 Solitary pulmonary nodule: Secondary | ICD-10-CM

## 2023-04-06 DIAGNOSIS — J392 Other diseases of pharynx: Secondary | ICD-10-CM | POA: Diagnosis not present

## 2023-04-06 DIAGNOSIS — J029 Acute pharyngitis, unspecified: Secondary | ICD-10-CM

## 2023-04-06 DIAGNOSIS — J353 Hypertrophy of tonsils with hypertrophy of adenoids: Secondary | ICD-10-CM | POA: Diagnosis not present

## 2023-04-06 DIAGNOSIS — M47812 Spondylosis without myelopathy or radiculopathy, cervical region: Secondary | ICD-10-CM | POA: Diagnosis not present

## 2023-04-06 LAB — CBC WITH DIFFERENTIAL/PLATELET
Abs Immature Granulocytes: 0.03 10*3/uL (ref 0.00–0.07)
Basophils Absolute: 0 10*3/uL (ref 0.0–0.1)
Basophils Relative: 0 %
Eosinophils Absolute: 0.1 10*3/uL (ref 0.0–0.5)
Eosinophils Relative: 1 %
HCT: 41.6 % (ref 36.0–46.0)
Hemoglobin: 13.9 g/dL (ref 12.0–15.0)
Immature Granulocytes: 0 %
Lymphocytes Relative: 23 %
Lymphs Abs: 1.7 10*3/uL (ref 0.7–4.0)
MCH: 27.7 pg (ref 26.0–34.0)
MCHC: 33.4 g/dL (ref 30.0–36.0)
MCV: 83 fL (ref 80.0–100.0)
Monocytes Absolute: 0.7 10*3/uL (ref 0.1–1.0)
Monocytes Relative: 9 %
Neutro Abs: 4.9 10*3/uL (ref 1.7–7.7)
Neutrophils Relative %: 67 %
Platelets: 261 10*3/uL (ref 150–400)
RBC: 5.01 MIL/uL (ref 3.87–5.11)
RDW: 15.7 % — ABNORMAL HIGH (ref 11.5–15.5)
WBC: 7.4 10*3/uL (ref 4.0–10.5)
nRBC: 0 % (ref 0.0–0.2)

## 2023-04-06 LAB — COMPREHENSIVE METABOLIC PANEL
ALT: 14 U/L (ref 0–44)
AST: 18 U/L (ref 15–41)
Albumin: 4 g/dL (ref 3.5–5.0)
Alkaline Phosphatase: 73 U/L (ref 38–126)
Anion gap: 7 (ref 5–15)
BUN: 7 mg/dL (ref 6–20)
CO2: 26 mmol/L (ref 22–32)
Calcium: 9 mg/dL (ref 8.9–10.3)
Chloride: 105 mmol/L (ref 98–111)
Creatinine, Ser: 0.76 mg/dL (ref 0.44–1.00)
GFR, Estimated: >60 mL/min (ref >60)
Glucose, Bld: 124 mg/dL — ABNORMAL HIGH (ref 70–99)
Potassium: 3.6 mmol/L (ref 3.5–5.1)
Sodium: 138 mmol/L (ref 135–145)
Total Bilirubin: 0.7 mg/dL (ref 0.3–1.2)
Total Protein: 7 g/dL (ref 6.5–8.1)

## 2023-04-06 LAB — MONONUCLEOSIS SCREEN: Mono Screen: NEGATIVE

## 2023-04-06 MED ORDER — IOHEXOL 300 MG/ML  SOLN
100.0000 mL | Freq: Once | INTRAMUSCULAR | Status: AC | PRN
  Administered 2023-04-06: 75 mL via INTRAVENOUS

## 2023-04-06 MED ORDER — SODIUM CHLORIDE 0.9 % IV BOLUS
1000.0000 mL | Freq: Once | INTRAVENOUS | Status: AC
  Administered 2023-04-06: 1000 mL via INTRAVENOUS

## 2023-04-06 MED ORDER — MORPHINE SULFATE (PF) 4 MG/ML IV SOLN: 4.0000 mg | Freq: Once | INTRAVENOUS | Status: DC

## 2023-04-06 MED ORDER — OXYCODONE HCL 5 MG PO TABS: 5.0000 mg | ORAL_TABLET | ORAL | 0 refills | Status: DC | PRN

## 2023-04-06 MED ORDER — METHYLPREDNISOLONE SODIUM SUCC 125 MG IJ SOLR
125.0000 mg | Freq: Once | INTRAMUSCULAR | Status: AC
  Administered 2023-04-06: 125 mg via INTRAVENOUS
  Filled 2023-04-06: qty 2

## 2023-04-06 MED ORDER — PREDNISONE 50 MG PO TABS: 50.0000 mg | ORAL_TABLET | Freq: Every day | ORAL | 0 refills | Status: DC

## 2023-04-06 MED ORDER — ACETAMINOPHEN 325 MG PO TABS
650.0000 mg | ORAL_TABLET | Freq: Once | ORAL | Status: AC
  Administered 2023-04-06: 650 mg via ORAL
  Filled 2023-04-06: qty 2

## 2023-04-06 MED ORDER — KETOROLAC TROMETHAMINE 30 MG/ML IJ SOLN
30.0000 mg | Freq: Once | INTRAMUSCULAR | Status: AC
  Administered 2023-04-06: 30 mg via INTRAVENOUS
  Filled 2023-04-06: qty 1

## 2023-04-06 NOTE — ED Notes (Signed)
Reviewed AVS with patient, patient expressed understanding of directions, denies further questions at this time. 

## 2023-04-06 NOTE — Discharge Instructions (Addendum)
Drink plenty of fluids.  You may take acetaminophen and/or ibuprofen as needed for pain.  When you combine acetaminophen and ibuprofen, you get better pain relief and you get from taking either medication by itself.  If at any point your physically not able to swallow, then return to the emergency department for reevaluation.  Your CT scan did show a nodule in your right lung.  Recommendation is to obtain a CT scan of your chest without contrast.  Your primary care provider can arrange for that.

## 2023-04-06 NOTE — ED Provider Notes (Signed)
[place] EMERGENCY DEPARTMENT AT [facility] Provider Note   CSN: [id number] Arrival date & time: 04/05/23  2106     History  Chief Complaint  Patient presents with   Sore Throat    Mackenzie Patterson is a 52 y.o. female.  The history is provided by the patient.  Sore Throat  She has history of hypertension, diabetes, transient ischemic attack, ventricular septal defect and comes in because of sore throat for the last 2 days.  It is very painful to swallow and she has been spitting her saliva out.  She denies fever or chills.  She has been taking acetaminophen for pain without relief.   Home Medications Prior to Admission medications   Medication Sig Start Date End Date Taking? Authorizing Provider  Accu-Chek FastClix Lancets MISC USE 2 TIMES DAILY AS DIRECTED 07/26/19   Grayce Sessions, NP  acyclovir (ZOVIRAX) 400 MG tablet Take 1 tablet (400 mg total) by mouth 2 (two) times daily. 04/27/22   Brock Bad, MD  albuterol (ACCUNEB) 0.63 MG/3ML nebulizer solution Take 1 ampule by nebulization 3 (three) times daily as needed for wheezing or shortness of breath. 08/05/22   [provider]  albuterol (VENTOLIN HFA) 108 (90 Base) MCG/ACT inhaler Inhale 2 puffs into the lungs every 6 (six) hours as needed for wheezing or shortness of breath. 01/27/20   Grayce Sessions, NP  amLODipine (NORVASC) 5 MG tablet Take 1 tablet (5 mg total) by mouth daily. 03/24/21   Grayce Sessions, NP  atorvastatin (LIPITOR) 40 MG tablet Take 40 mg by mouth daily. 02/14/21   [provider]  cetirizine (ZYRTEC) 10 MG tablet Take 1 tablet (10 mg total) by mouth daily. At bedtime as needed 05/06/21   Grayce Sessions, NP  clopidogrel (PLAVIX) 75 MG tablet Take 75 mg by mouth daily.    [provider]  fluticasone (FLONASE) 50 MCG/ACT nasal spray SPRAY 2 SPRAYS INTO EACH NOSTRIL EVERY DAY Patient taking differently: Place 2 sprays into both nostrils daily as needed for  allergies or rhinitis. 03/25/21   Grayce Sessions, NP  glucose blood ([employer] GLUCOSE METER TEST STRIPS) test strip Use as instructed 07/30/19   Grayce Sessions, NP  ibuprofen (ADVIL) 800 MG tablet Take 1 tablet (800 mg total) by mouth 3 (three) times daily. 10/05/22   Hermina Staggers, MD  JANUMET 50-1000 MG tablet TAKE 1 TABLET DAILY AFTER BREAKFAST 08/30/21   Grayce Sessions, NP  magic mouthwash (nystatin, lidocaine, diphenhydrAMINE, alum & mag hydroxide) suspension Swish and spit 5 mLs 3 (three) times daily as needed for mouth pain. 05/21/22   Ernie Avena, MD  metoCLOPramide (REGLAN) 10 MG tablet Take 1 tablet (10 mg total) by mouth every 6 (six) hours as needed for nausea or vomiting. 10/05/22   Hermina Staggers, MD  oxyCODONE-acetaminophen (PERCOCET/ROXICET) 5-325 MG tablet Take 1 tablet by mouth every 4 (four) hours as needed for moderate pain. 10/05/22   Hermina Staggers, MD  SYMBICORT 160-4.5 MCG/ACT inhaler INHALE 2 PUFFS INTO THE LUNGS TWICE A DAY 03/09/21   Grayce Sessions, NP  triamcinolone cream (KENALOG) 0.1 % APPLY TO AFFECTED AREA TWICE A DAY 05/01/21   Grayce Sessions, NP  valACYclovir (VALTREX) 1000 MG tablet Take 1 tablet (1,000 mg total) by mouth 3 (three) times daily. 05/21/22   Ernie Avena, MD      Allergies    Penicillins, Tape, Other, Drug ingredient [benzonatate], Vitamin k, and Latex  Review of Systems   Review of Systems  All other systems reviewed and are negative.   Physical Exam Updated Vital Signs BP 136/81 (BP Location: Left Arm)   Pulse 89   Temp 99.3 F (37.4 C) (Oral)   Resp 19   LMP 09/27/2022 Comment: POCT Urine negative 10/04/22  SpO2 99%  Physical Exam Vitals and nursing note reviewed.   52 year old female, resting comfortably and in no acute distress. Vital signs are normal. Oxygen saturation is 99%, which is normal.  She is spitting saliva into a cup. Head is normocephalic and atraumatic. PERRLA, EOMI. Oropharynx is moderately  erythematous without exudate or tonsillar hypertrophy.  There is no pooling of secretions.  Voice is hoarse. Neck is nontender and supple with tender anterior cervical adenopathy bilaterally. Lungs are clear without rales, wheezes, or rhonchi. Chest is nontender. Heart has regular rate and rhythm with 3/6 harsh systolic ejection murmur best heard along left sternal border. Abdomen is soft, flat, nontender. Extremities have no cyanosis or edema, full range of motion is present. Skin is warm and dry without rash. Neurologic: Mental status is normal, cranial nerves are intact, moves all extremities equally.  ED Results / Procedures / Treatments   Labs (all labs ordered are listed, but only abnormal results are displayed) Labs Reviewed  CBC WITH DIFFERENTIAL/PLATELET - Abnormal; Notable for the following components:      Result Value   RDW 15.7 (*)    All other components within normal limits  COMPREHENSIVE METABOLIC PANEL - Abnormal; Notable for the following components:   Glucose, Bld 124 (*)    All other components within normal limits  GROUP A STREP BY PCR  MONONUCLEOSIS SCREEN   Radiology CT Soft Tissue Neck W Contrast  Result Date: 04/06/2023 CLINICAL DATA:  Initial evaluation for acute sore throat. EXAM: CT NECK WITH CONTRAST TECHNIQUE: Multidetector CT imaging of the neck was performed using the standard protocol following the bolus administration of intravenous contrast. RADIATION DOSE REDUCTION: This exam was performed according to the departmental dose-optimization program which includes automated exposure control, adjustment of the mA and/or kV according to patient size and/or use of iterative reconstruction technique. CONTRAST:  75mL OMNIPAQUE IOHEXOL 300 MG/ML  SOLN COMPARISON:  None Available. FINDINGS: Pharynx and larynx: Oral cavity within normal limits. Palatine tonsils are prominent and somewhat hyperenhancing, suggesting acute tonsillitis, greater on the left. Adenoidal  soft tissues mildly prominent as well. No discrete tonsillar or peritonsillar abscess. Uvula is mildly swollen and edematous. Mild mucosal edema within the left oropharynx, suggesting associated pharyngitis. Epiglottis itself within normal limits. No retropharyngeal collection. Remainder of the hypopharynx and supraglottic larynx within normal limits. Negative glottis. Subglottic airway clear. Salivary glands: Salivary glands including the parotid and submandibular glands are within normal limits. Thyroid: Normal. Lymph nodes: No enlarged or pathologic adenopathy within the neck. Vascular: Normal intravascular enhancement seen within the neck. Limited intracranial: Unremarkable. Visualized orbits: Unremarkable. Mastoids and visualized paranasal sinuses: Scattered mucosal thickening present about the ethmoidal air cells and maxillary sinuses. No air-fluid levels. Visualized mastoids and middle ear cavities are clear. Skeleton: No discrete or worrisome osseous lesions. Mild-to-moderate multilevel cervical spondylosis, most pronounced at C6-7. Upper chest: 10 mm pulmonary nodule present within the right upper lobe (series 3, image 100), indeterminate. Other: None. IMPRESSION: 1. Findings suggestive of acute tonsillitis/pharyngitis as above. No discrete tonsillar or peritonsillar abscess. 2. 10 mm right upper lobe pulmonary nodule, indeterminate. Per Fleischner Society Guidelines, recommend prompt non-contrast Chest CT for further evaluation.  Electronically Signed   By: Rise Mu M.D.   On: 04/06/2023 02:33    Procedures Procedures    Medications Ordered in ED Medications  morphine (PF) 4 MG/ML injection 4 mg (has no administration in time range)  sodium chloride 0.9 % bolus 1,000 mL (0 mLs Intravenous Stopped 04/06/23 0259)  methylPREDNISolone sodium succinate (SOLU-MEDROL) 125 mg/2 mL injection 125 mg (125 mg Intravenous Given 04/06/23 0041)  ketorolac (TORADOL) 30 MG/ML injection 30 mg (30 mg  Intravenous Given 04/06/23 0042)  iohexol (OMNIPAQUE) 300 MG/ML solution 100 mL (75 mLs Intravenous Contrast Given 04/06/23 0149)    ED Course/ Medical Decision Making/ A&P                                 Medical Decision Making Amount and/or Complexity of Data Reviewed Labs: ordered. Radiology: ordered.  Risk Prescription drug management.   Sore throat with difficulty swallowing but minimal findings on exam.  Differential diagnosis includes streptococcal pharyngitis, viral pharyngitis, infectious mononucleosis, epiglottitis, Ludwig's angina.  I have reviewed her laboratory tests and my interpretation is negative PCR for streptococcal disease.  I have ordered IV fluids.  I have ordered additional labs of CBC, comprehensive metabolic panel, monoscreen and I have ordered CT of soft tissue of the neck to rule out deep infection.  I have reviewed her laboratory tests, and my interpretation is normal CBC, comprehensive metabolic panel significant only for mildly elevated random glucose which will need to be followed as an outpatient, negative monoscreen.  CT of neck shows findings of tonsillitis/pharyngitis without abscess and without evidence of deep infection, incidental finding of 10 mm right upper lobe nodule which will require outpatient CT of chest to further evaluate.  I have independently viewed the images, and agree with radiologist's interpretation.  Patient does not feel any better following above noted treatment, but she did seem more comfortable and was no longer spitting her saliva.  I am discharging her with a prescription for a 5-day course of prednisone and I am referring her back to her primary care provider to arrange for CT of chest as an outpatient.  I have recommended that she drink plenty of fluids, use over-the-counter NSAIDs and acetaminophen as needed for pain, return for worsening symptoms or inability to swallow.  Final Clinical Impression(s) / ED Diagnoses Final  diagnoses:  Pharyngitis, unspecified etiology  Elevated random blood glucose level  Lung nodule    Rx / DC Orders ED Discharge Orders          Ordered    predniSONE (DELTASONE) 50 MG tablet  Daily        04/06/23 0257              Dione Booze, MD 04/06/23 0302

## 2023-04-20 DIAGNOSIS — D649 Anemia, unspecified: Secondary | ICD-10-CM | POA: Diagnosis not present

## 2023-04-20 DIAGNOSIS — Z90710 Acquired absence of both cervix and uterus: Secondary | ICD-10-CM | POA: Diagnosis not present

## 2023-04-20 DIAGNOSIS — Q210 Ventricular septal defect: Secondary | ICD-10-CM | POA: Diagnosis not present

## 2023-04-20 DIAGNOSIS — E119 Type 2 diabetes mellitus without complications: Secondary | ICD-10-CM | POA: Diagnosis not present

## 2023-05-30 DIAGNOSIS — I10 Essential (primary) hypertension: Secondary | ICD-10-CM | POA: Diagnosis not present

## 2023-05-30 DIAGNOSIS — Z23 Encounter for immunization: Secondary | ICD-10-CM | POA: Diagnosis not present

## 2023-05-30 DIAGNOSIS — Z2821 Immunization not carried out because of patient refusal: Secondary | ICD-10-CM | POA: Diagnosis not present

## 2023-05-30 DIAGNOSIS — Q210 Ventricular septal defect: Secondary | ICD-10-CM | POA: Diagnosis not present

## 2023-05-31 ENCOUNTER — Other Ambulatory Visit: Payer: Self-pay | Admitting: Internal Medicine

## 2023-05-31 DIAGNOSIS — Z1231 Encounter for screening mammogram for malignant neoplasm of breast: Secondary | ICD-10-CM

## 2023-06-06 ENCOUNTER — Ambulatory Visit (HOSPITAL_BASED_OUTPATIENT_CLINIC_OR_DEPARTMENT_OTHER): Payer: Medicaid Other | Admitting: Family Medicine

## 2023-06-08 DIAGNOSIS — R051 Acute cough: Secondary | ICD-10-CM | POA: Diagnosis not present

## 2023-06-08 DIAGNOSIS — J329 Chronic sinusitis, unspecified: Secondary | ICD-10-CM | POA: Diagnosis not present

## 2023-07-03 ENCOUNTER — Ambulatory Visit: Payer: Medicaid Other

## 2023-07-11 DIAGNOSIS — R911 Solitary pulmonary nodule: Secondary | ICD-10-CM | POA: Diagnosis not present

## 2023-07-11 DIAGNOSIS — I517 Cardiomegaly: Secondary | ICD-10-CM | POA: Diagnosis not present

## 2023-07-25 ENCOUNTER — Ambulatory Visit
Admission: RE | Admit: 2023-07-25 | Discharge: 2023-07-25 | Disposition: A | Discharge status: Home / Self Care | Payer: Medicaid Other | Source: Ambulatory Visit | Attending: Internal Medicine | Admitting: Internal Medicine

## 2023-07-25 ENCOUNTER — Ambulatory Visit: Payer: Medicaid Other

## 2023-07-25 DIAGNOSIS — Z1231 Encounter for screening mammogram for malignant neoplasm of breast: Secondary | ICD-10-CM | POA: Diagnosis not present

## 2023-08-25 DIAGNOSIS — R051 Acute cough: Secondary | ICD-10-CM | POA: Diagnosis not present

## 2023-08-25 DIAGNOSIS — Q210 Ventricular septal defect: Secondary | ICD-10-CM | POA: Diagnosis not present

## 2023-08-25 DIAGNOSIS — E119 Type 2 diabetes mellitus without complications: Secondary | ICD-10-CM | POA: Diagnosis not present

## 2023-08-25 DIAGNOSIS — D649 Anemia, unspecified: Secondary | ICD-10-CM | POA: Diagnosis not present

## 2023-09-10 ENCOUNTER — Other Ambulatory Visit: Payer: Self-pay

## 2023-09-10 ENCOUNTER — Emergency Department (HOSPITAL_BASED_OUTPATIENT_CLINIC_OR_DEPARTMENT_OTHER)
Admission: EM | Admit: 2023-09-10 | Discharge: 2023-09-10 | Disposition: A | Discharge status: Home / Self Care | Attending: Emergency Medicine | Admitting: Emergency Medicine

## 2023-09-10 ENCOUNTER — Emergency Department (HOSPITAL_BASED_OUTPATIENT_CLINIC_OR_DEPARTMENT_OTHER): Admitting: Radiology

## 2023-09-10 ENCOUNTER — Encounter (HOSPITAL_BASED_OUTPATIENT_CLINIC_OR_DEPARTMENT_OTHER): Payer: Self-pay | Admitting: Emergency Medicine

## 2023-09-10 DIAGNOSIS — Z79899 Other long term (current) drug therapy: Secondary | ICD-10-CM | POA: Diagnosis not present

## 2023-09-10 DIAGNOSIS — Z91040 Latex allergy status: Secondary | ICD-10-CM | POA: Diagnosis not present

## 2023-09-10 DIAGNOSIS — I10 Essential (primary) hypertension: Secondary | ICD-10-CM | POA: Diagnosis not present

## 2023-09-10 DIAGNOSIS — Z8673 Personal history of transient ischemic attack (TIA), and cerebral infarction without residual deficits: Secondary | ICD-10-CM | POA: Diagnosis not present

## 2023-09-10 DIAGNOSIS — J449 Chronic obstructive pulmonary disease, unspecified: Secondary | ICD-10-CM | POA: Insufficient documentation

## 2023-09-10 DIAGNOSIS — M545 Low back pain, unspecified: Secondary | ICD-10-CM | POA: Diagnosis present

## 2023-09-10 DIAGNOSIS — M5126 Other intervertebral disc displacement, lumbar region: Secondary | ICD-10-CM | POA: Diagnosis not present

## 2023-09-10 DIAGNOSIS — M47816 Spondylosis without myelopathy or radiculopathy, lumbar region: Secondary | ICD-10-CM | POA: Diagnosis not present

## 2023-09-10 DIAGNOSIS — M25551 Pain in right hip: Secondary | ICD-10-CM | POA: Insufficient documentation

## 2023-09-10 DIAGNOSIS — Z7901 Long term (current) use of anticoagulants: Secondary | ICD-10-CM | POA: Insufficient documentation

## 2023-09-10 DIAGNOSIS — M48061 Spinal stenosis, lumbar region without neurogenic claudication: Secondary | ICD-10-CM | POA: Diagnosis not present

## 2023-09-10 DIAGNOSIS — M5441 Lumbago with sciatica, right side: Secondary | ICD-10-CM | POA: Diagnosis not present

## 2023-09-10 MED ORDER — IBUPROFEN 400 MG PO TABS
600.0000 mg | ORAL_TABLET | Freq: Once | ORAL | Status: AC
  Administered 2023-09-10: 600 mg via ORAL
  Filled 2023-09-10: qty 1

## 2023-09-10 NOTE — Discharge Instructions (Addendum)
 Evaluation today was overall reassuring.  Suspect her symptoms could be related to sciatica.  The treatment for now is conservative treatment at home along with Tylenol and ibuprofen.  Please follow-up with your PCP.

## 2023-09-10 NOTE — ED Triage Notes (Signed)
 C/o lower back pain that rads into R hip x 2 weeks. Denies any recent injuries.

## 2023-09-10 NOTE — ED Notes (Signed)
 Pt. returned from XR.

## 2023-09-10 NOTE — ED Provider Notes (Signed)
 [place] EMERGENCY DEPARTMENT AT [facility] Provider Note   CSN: [id number] Arrival date & time: 09/10/23  1000     History  Chief Complaint  Patient presents with   Back Pain   HPI Mackenzie Patterson is a 53 y.o. female with history of stroke, COPD, congenital VSD, hypertension, anemia presenting for hip pain.  Started 3 weeks ago.  Located in the right hip.  Pain at times radiates down the right leg into the right lower back.  States its become progressively more difficult to walk bear weight on the right leg.  Denies trauma.  Denies any saddle anesthesia, urinary or bowel changes, fever.   Back Pain      Home Medications Prior to Admission medications   Medication Sig Start Date End Date Taking? Authorizing Provider  Accu-Chek FastClix Lancets MISC USE 2 TIMES DAILY AS DIRECTED 07/26/19   Grayce Sessions, NP  acyclovir (ZOVIRAX) 400 MG tablet Take 1 tablet (400 mg total) by mouth 2 (two) times daily. 04/27/22   Brock Bad, MD  albuterol (ACCUNEB) 0.63 MG/3ML nebulizer solution Take 1 ampule by nebulization 3 (three) times daily as needed for wheezing or shortness of breath. 08/05/22   [provider]  albuterol (VENTOLIN HFA) 108 (90 Base) MCG/ACT inhaler Inhale 2 puffs into the lungs every 6 (six) hours as needed for wheezing or shortness of breath. 01/27/20   Grayce Sessions, NP  amLODipine (NORVASC) 5 MG tablet Take 1 tablet (5 mg total) by mouth daily. 03/24/21   Grayce Sessions, NP  atorvastatin (LIPITOR) 40 MG tablet Take 40 mg by mouth daily. 02/14/21   [provider]  cetirizine (ZYRTEC) 10 MG tablet Take 1 tablet (10 mg total) by mouth daily. At bedtime as needed 05/06/21   Grayce Sessions, NP  clopidogrel (PLAVIX) 75 MG tablet Take 75 mg by mouth daily.    [provider]  fluticasone (FLONASE) 50 MCG/ACT nasal spray SPRAY 2 SPRAYS INTO EACH NOSTRIL EVERY DAY Patient taking differently: Place 2 sprays into both nostrils  daily as needed for allergies or rhinitis. 03/25/21   Grayce Sessions, NP  glucose blood ([employer] GLUCOSE METER TEST STRIPS) test strip Use as instructed 07/30/19   Grayce Sessions, NP  ibuprofen (ADVIL) 800 MG tablet Take 1 tablet (800 mg total) by mouth 3 (three) times daily. 10/05/22   Hermina Staggers, MD  JANUMET 50-1000 MG tablet TAKE 1 TABLET DAILY AFTER BREAKFAST 08/30/21   Grayce Sessions, NP  magic mouthwash (nystatin, lidocaine, diphenhydrAMINE, alum & mag hydroxide) suspension Swish and spit 5 mLs 3 (three) times daily as needed for mouth pain. 05/21/22   Ernie Avena, MD  metoCLOPramide (REGLAN) 10 MG tablet Take 1 tablet (10 mg total) by mouth every 6 (six) hours as needed for nausea or vomiting. 10/05/22   Hermina Staggers, MD  oxyCODONE (ROXICODONE) 5 MG immediate release tablet Take 1 tablet (5 mg total) by mouth every 4 (four) hours as needed for severe pain. 04/06/23   Dione Booze, MD  predniSONE (DELTASONE) 50 MG tablet Take 1 tablet (50 mg total) by mouth daily. 04/06/23   Dione Booze, MD  SYMBICORT 160-4.5 MCG/ACT inhaler INHALE 2 PUFFS INTO THE LUNGS TWICE A DAY 03/09/21   Grayce Sessions, NP  triamcinolone cream (KENALOG) 0.1 % APPLY TO AFFECTED AREA TWICE A DAY 05/01/21   Grayce Sessions, NP      Allergies    Penicillins, Tape, Other, Drug ingredient [  benzonatate], Vitamin k, and Latex    Review of Systems   Review of Systems  Musculoskeletal:  Positive for back pain.    Physical Exam Updated Vital Signs BP (!) 161/74 (BP Location: Right Arm)   Pulse 85   Temp 98.6 F (37 C) (Oral)   Resp 18   Ht 4\' 10"  (1.473 m)   Wt 73.5 kg   LMP 09/27/2022 Comment: POCT Urine negative 10/04/22  SpO2 99%   BMI 33.86 kg/m  Physical Exam Constitutional:      Appearance: Normal appearance.  HENT:     Head: Normocephalic.     Nose: Nose normal.  Eyes:     Conjunctiva/sclera: Conjunctivae normal.  Pulmonary:     Effort: Pulmonary effort is normal.   Musculoskeletal:       Back:     Right hip: Tenderness present.  Neurological:     Mental Status: She is alert.  Psychiatric:        Mood and Affect: Mood normal.     ED Results / Procedures / Treatments   Labs (all labs ordered are listed, but only abnormal results are displayed) Labs Reviewed - No data to display  EKG None  Radiology DG Hip Unilat With Pelvis 2-3 Views Right Result Date: 09/10/2023 CLINICAL DATA:  Right hip pain. EXAM: DG HIP (WITH OR WITHOUT PELVIS) 2-3V RIGHT COMPARISON:  None Available. FINDINGS: There is no evidence of hip fracture or dislocation. There is no evidence of arthropathy or other focal bone abnormality. IMPRESSION: Negative. Electronically Signed   By: Amie Portland M.D.   On: 09/10/2023 11:57   DG Lumbar Spine Complete Result Date: 09/10/2023 CLINICAL DATA:  Provided history: Pain. Additional history provided: Low back pain radiating into right hip. EXAM: LUMBAR SPINE - COMPLETE 4+ VIEW COMPARISON:  None. FINDINGS: Limited evaluation on the oblique radiographs due to suboptimal positioning. Within this limitation, findings are as follows. There are 5 lumbar vertebrae and the caudal most well-formed intervertebral disc space is designated L5-S1. Slight dextrocurvature of the lumbar and visualized lower thoracic spine. Mild grade 1 retrolisthesis at L2-L3 and L3-L4. No lumbar vertebral compression fracture. Mild disc space narrowing at L1-L2 and L2-L3. Posterior endplate osteophytes at L2-L3. Minimal facet arthropathy at L4-L5 and L5-S1. Multilevel ventrolateral osteophytes. IMPRESSION: 1. Limited evaluation on the oblique radiographs due to suboptimal positioning. Within this limitation, findings are as follows. 2. No lumbar vertebral compression fracture. 3. Lumbar spondylosis as described. 4. Mild dextrocurvature of the lumbar and visualized lower thoracic spine. 5. Mild grade 1 retrolisthesis at L2-L3 and L3-L4. Electronically Signed   By: Jackey Loge  D.O.   On: 09/10/2023 10:45    Procedures Procedures    Medications Ordered in ED Medications  ibuprofen (ADVIL) tablet 600 mg (600 mg Oral Given 09/10/23 1205)    ED Course/ Medical Decision Making/ A&P                                 Medical Decision Making Amount and/or Complexity of Data Reviewed Radiology: ordered.   53 year old well-appearing female presenting for right hip and lower back pain.  Exam notable for tenderness about the right hip joint and right buttock.  X-rays were nonacute.  Hip joint does not appear infected.  Did share the results with patient.  Given the location of her tenderness, likely sciatica.  Have low suspicion for more sinister pathology given lack of risk factors and  reassuring exam.  Advised conservative treatment for now along with Tylenol and NSAIDs at home.  Advised that she follow-up with her PCP.  Discussed return precautions.  Discharged.        Final Clinical Impression(s) / ED Diagnoses Final diagnoses:  Right hip pain    Rx / DC Orders ED Discharge Orders     None         Gareth Eagle, PA-C 09/10/23 1238    Derwood Kaplan, MD 09/11/23 [phone]

## 2023-10-10 ENCOUNTER — Ambulatory Visit (HOSPITAL_BASED_OUTPATIENT_CLINIC_OR_DEPARTMENT_OTHER): Admitting: Certified Nurse Midwife

## 2023-10-10 ENCOUNTER — Encounter (HOSPITAL_BASED_OUTPATIENT_CLINIC_OR_DEPARTMENT_OTHER): Payer: Self-pay | Admitting: Certified Nurse Midwife

## 2023-10-10 ENCOUNTER — Other Ambulatory Visit (HOSPITAL_COMMUNITY)
Admission: RE | Admit: 2023-10-10 | Discharge: 2023-10-10 | Disposition: A | Discharge status: Home / Self Care | Source: Ambulatory Visit | Attending: Certified Nurse Midwife | Admitting: Certified Nurse Midwife

## 2023-10-10 VITALS — BP 131/64 | HR 90 | Ht 60.5 in | Wt 162.8 lb

## 2023-10-10 DIAGNOSIS — R61 Generalized hyperhidrosis: Secondary | ICD-10-CM | POA: Diagnosis not present

## 2023-10-10 DIAGNOSIS — N898 Other specified noninflammatory disorders of vagina: Secondary | ICD-10-CM | POA: Diagnosis not present

## 2023-10-10 DIAGNOSIS — Z01411 Encounter for gynecological examination (general) (routine) with abnormal findings: Secondary | ICD-10-CM | POA: Diagnosis not present

## 2023-10-10 DIAGNOSIS — L80 Vitiligo: Secondary | ICD-10-CM | POA: Diagnosis not present

## 2023-10-10 DIAGNOSIS — Z01419 Encounter for gynecological examination (general) (routine) without abnormal findings: Secondary | ICD-10-CM

## 2023-10-10 DIAGNOSIS — Z113 Encounter for screening for infections with a predominantly sexual mode of transmission: Secondary | ICD-10-CM | POA: Insufficient documentation

## 2023-10-10 MED ORDER — TACROLIMUS 0.1 % EX OINT: TOPICAL_OINTMENT | Freq: Two times a day (BID) | CUTANEOUS | 2 refills | Status: AC

## 2023-10-10 MED ORDER — GABAPENTIN 300 MG PO CAPS: 300.0000 mg | ORAL_CAPSULE | Freq: Three times a day (TID) | ORAL | 0 refills | Status: AC

## 2023-10-10 NOTE — Progress Notes (Signed)
 53 y.o. [id number] Single Black or African American female here for annual exam. Pt reports hot flashes/night sweats.    Patient's last menstrual period was 09/27/2022.          Sexually active: Yes.    The current method of family planning is status post hysterectomy.    Exercising: Yes.     Smoker:  no  Health Maintenance: Pap:  03/27/22 ASCUS MMG:  07/25/23 Colonoscopy:  Pt will discuss with PCP BMD:   n/a    reports that she has never smoked. She has never used smokeless tobacco. She reports that she does not currently use alcohol. She reports that she does not use drugs.  Past Medical History:  Diagnosis Date   Anemia    iron deficency   Anginal pain (HCC)    Asthma    Breast discharge 01/10/2017   Complication of anesthesia    Congenital ventricular septal defect    COPD (chronic obstructive pulmonary disease) (HCC)    Diabetes mellitus without complication (HCC)    Dyspnea    R/T heart defect   Family history of adverse reaction to anesthesia    father - n/v   Headache    Heart murmur    Hypertension    Obesity    PONV (postoperative nausea and vomiting)    Pulmonary hypertension (HCC)    mild    Stroke (HCC) 2023   TIA x9   Tricuspid regurgitation    VSD (ventricular septal defect), perimembranous    Qp:Qs 1.7:1 by catheterization     Past Surgical History:  Procedure Laterality Date   CARDIAC CATHETERIZATION [name] 10/30/2014   Procedure: Right Heart Cath;  Surgeon: Peter M Swaziland, MD;  Location: [facility] INVASIVE CV LAB CUPID;  Service: Cardiovascular;  Laterality: [name];   CHOLECYSTECTOMY  2018   CYST EXCISION     LEFT AND RIGHT HEART CATHETERIZATION WITH CORONARY ANGIOGRAM [name] 09/11/2013   Procedure: LEFT AND RIGHT HEART CATHETERIZATION WITH CORONARY ANGIOGRAM;  Surgeon: Arlander Bellman, MD;  Location: [facility] CATH LAB;  Service: Cardiovascular;  Laterality: [name];   RIGHT HEART CATH [name] 09/02/2016   Procedure: Right Heart Cath;  Surgeon: Arty Binning, MD;  Location: [facility]  INVASIVE CV LAB;  Service: Cardiovascular;  Laterality: [name];   TUBAL LIGATION     VAGINAL HYSTERECTOMY Bilateral 10/04/2022   Procedure: HYSTERECTOMY VAGINAL WITH POSSIBLE SALPINGECTOMY;  Surgeon: Othelia Blinks, MD;  Location: [facility] OR;  Service: Gynecology;  Laterality: Bilateral;    Current Outpatient Medications  Medication Sig Dispense Refill   Accu-Chek FastClix Lancets MISC USE 2 TIMES DAILY AS DIRECTED 100 each 11   acyclovir (ZOVIRAX) 400 MG tablet Take 1 tablet (400 mg total) by mouth 2 (two) times daily. 180 tablet 2   albuterol (ACCUNEB) 0.63 MG/3ML nebulizer solution Take 1 ampule by nebulization 3 (three) times daily as needed for wheezing or shortness of breath.     albuterol (VENTOLIN HFA) 108 (90 Base) MCG/ACT inhaler Inhale 2 puffs into the lungs every 6 (six) hours as needed for wheezing or shortness of breath. 6.7 g 1   amLODipine (NORVASC) 5 MG tablet Take 1 tablet (5 mg total) by mouth daily. 90 tablet 1   atorvastatin (LIPITOR) 40 MG tablet Take 40 mg by mouth daily.     cetirizine (ZYRTEC) 10 MG tablet Take 1 tablet (10 mg total) by mouth daily. At bedtime as needed 30 tablet 11   clopidogrel (PLAVIX) 75 MG tablet Take 75 mg by mouth  daily.     fluticasone (FLONASE) 50 MCG/ACT nasal spray SPRAY 2 SPRAYS INTO EACH NOSTRIL EVERY DAY (Patient taking differently: Place 2 sprays into both nostrils daily as needed for allergies or rhinitis.) 16 mL 6   glucose blood ([employer]S GLUCOSE METER TEST STRIPS) test strip Use as instructed 100 each 12   ibuprofen (ADVIL) 800 MG tablet Take 1 tablet (800 mg total) by mouth 3 (three) times daily. 30 tablet 1   JANUMET 50-1000 MG tablet TAKE 1 TABLET DAILY AFTER BREAKFAST 90 tablet 1   magic mouthwash (nystatin, lidocaine, diphenhydrAMINE, alum & mag hydroxide) suspension Swish and spit 5 mLs 3 (three) times daily as needed for mouth pain. 180 mL 0   metoCLOPramide (REGLAN) 10 MG tablet Take 1 tablet (10 mg total) by mouth every 6 (six) hours as  needed for nausea or vomiting. 30 tablet 1   oxyCODONE (ROXICODONE) 5 MG immediate release tablet Take 1 tablet (5 mg total) by mouth every 4 (four) hours as needed for severe pain. 10 tablet 0   predniSONE (DELTASONE) 50 MG tablet Take 1 tablet (50 mg total) by mouth daily. 5 tablet 0   SYMBICORT 160-4.5 MCG/ACT inhaler INHALE 2 PUFFS INTO THE LUNGS TWICE A DAY 10.2 each 5   triamcinolone cream (KENALOG) 0.1 % APPLY TO AFFECTED AREA TWICE A DAY 454 g 1   No current facility-administered medications for this visit.    Family History  Problem Relation Age of Onset   Diabetes Mother    Diabetes Father    Cancer Other    Hyperlipidemia Other    Hypertension Other    Asthma Other    Breast cancer Neg Hx    BRCA 1/2 Neg Hx     ROS: Constitutional: negative Genitourinary:negative  Exam:   BP 131/64 (BP Location: Left Arm, Patient Position: Sitting, Cuff Size: Normal)   Pulse 90   Ht 5' 0.5" (1.537 m)   Wt 162 lb 12.8 oz (73.8 kg)   LMP 09/27/2022 Comment: POCT Urine negative 10/04/22  BMI 31.27 kg/m   Height: 5' 0.5" (153.7 cm)  General appearance: alert, cooperative and appears stated age Head: Normocephalic, without obvious abnormality, atraumatic Neck: no adenopathy, supple, symmetrical, trachea midline and thyroid normal to inspection and palpation Lungs: clear to auscultation bilaterally Breasts: normal appearance, no masses or tenderness, Inspection negative, No nipple retraction or dimpling, No nipple discharge or bleeding, No axillary or supraclavicular adenopathy, Normal to palpation without dominant masses Heart: regular rate and rhythm Abdomen: soft, non-tender; bowel sounds normal; no masses,  no organomegaly Extremities: extremities normal, atraumatic, no cyanosis or edema Skin: Skin color, texture, turgor normal. No rashes or lesions Lymph nodes: Cervical, supraclavicular, and axillary nodes normal. No abnormal inguinal nodes palpated Neurologic: Grossly  normal   Pelvic: External genitalia:  no lesions              Urethra:  normal appearing urethra with no masses, tenderness or lesions              Bartholins and Skenes: normal                 Vagina: normal appearing vagina with normal color and no discharge, no lesions; patches of vitiligo noted bilaterally inside labia majora              Cervix: absent              Pap taken: No. Bimanual Exam:  Uterus:  uterus absent  Adnexa: no mass, fullness, tenderness              Anus:  normal sphincter tone, no lesions  Chaperone,  CMA, was present for exam.  Assessment/Plan:   1. Encounter for annual routine gynecological examination - Continue annual screening mammograms and self breast awareness - Pt desired routine STI Screening - Discuss Colonoscopy with PCP  2. Vaginal discharge (Primary) - Cervicovaginal ancillary only( [place])  3. Screening examination for STI - Cervicovaginal ancillary only( [place]) - HIV antibody (with reflex) - Hepatitis B Surface AntiGEN - Hepatitis C Antibody - RPR   4. Vitiligo affecting Vulva - Tacrolimus ointment (thin layer) twice day - Referral to Dermatology   5. Hot Flashes/night sweats - Pt will trial Gabapentin 300mg  po at bedtime. - She will send MyChart message in 2-3 weeks with update on effectiveness of Gabapentin at reducing night sweats  RTO 1 year for annual gyn exam and prn if issues arise.  Yolanda Hence

## 2023-10-11 ENCOUNTER — Encounter (HOSPITAL_BASED_OUTPATIENT_CLINIC_OR_DEPARTMENT_OTHER): Payer: Self-pay | Admitting: Certified Nurse Midwife

## 2023-10-11 LAB — CERVICOVAGINAL ANCILLARY ONLY
Bacterial Vaginitis (gardnerella): POSITIVE — AB
Candida Glabrata: NEGATIVE
Candida Vaginitis: NEGATIVE
Chlamydia: NEGATIVE
Comment: NEGATIVE
Comment: NEGATIVE
Comment: NEGATIVE
Comment: NEGATIVE
Comment: NEGATIVE
Comment: NORMAL
Neisseria Gonorrhea: NEGATIVE
Trichomonas: NEGATIVE

## 2023-10-11 LAB — HEPATITIS C ANTIBODY: Hep C Virus Ab: NONREACTIVE

## 2023-10-11 LAB — HEPATITIS B SURFACE ANTIGEN: Hepatitis B Surface Ag: NEGATIVE

## 2023-10-11 LAB — RPR: RPR Ser Ql: NONREACTIVE

## 2023-10-11 LAB — HIV ANTIBODY (ROUTINE TESTING W REFLEX): HIV Screen 4th Generation wRfx: NONREACTIVE

## 2023-10-12 ENCOUNTER — Other Ambulatory Visit (HOSPITAL_BASED_OUTPATIENT_CLINIC_OR_DEPARTMENT_OTHER): Payer: Self-pay | Admitting: Certified Nurse Midwife

## 2023-10-12 MED ORDER — METRONIDAZOLE 500 MG PO TABS: 500.0000 mg | ORAL_TABLET | Freq: Two times a day (BID) | ORAL | 3 refills | Status: DC

## 2023-11-14 ENCOUNTER — Encounter (HOSPITAL_BASED_OUTPATIENT_CLINIC_OR_DEPARTMENT_OTHER): Payer: Self-pay | Admitting: Certified Nurse Midwife

## 2023-11-17 ENCOUNTER — Emergency Department (HOSPITAL_BASED_OUTPATIENT_CLINIC_OR_DEPARTMENT_OTHER)
Admission: EM | Admit: 2023-11-17 | Discharge: 2023-11-17 | Disposition: A | Discharge status: Home / Self Care | Attending: Emergency Medicine | Admitting: Emergency Medicine

## 2023-11-17 ENCOUNTER — Encounter (HOSPITAL_BASED_OUTPATIENT_CLINIC_OR_DEPARTMENT_OTHER): Payer: Self-pay | Admitting: *Deleted

## 2023-11-17 DIAGNOSIS — M25561 Pain in right knee: Secondary | ICD-10-CM | POA: Diagnosis not present

## 2023-11-17 DIAGNOSIS — M25562 Pain in left knee: Secondary | ICD-10-CM | POA: Insufficient documentation

## 2023-11-17 DIAGNOSIS — M7989 Other specified soft tissue disorders: Secondary | ICD-10-CM | POA: Diagnosis present

## 2023-11-17 DIAGNOSIS — I10 Essential (primary) hypertension: Secondary | ICD-10-CM | POA: Insufficient documentation

## 2023-11-17 DIAGNOSIS — Z79899 Other long term (current) drug therapy: Secondary | ICD-10-CM | POA: Diagnosis not present

## 2023-11-17 DIAGNOSIS — Z91040 Latex allergy status: Secondary | ICD-10-CM | POA: Insufficient documentation

## 2023-11-17 MED ORDER — MELOXICAM 7.5 MG PO TABS
7.5000 mg | ORAL_TABLET | Freq: Every day | ORAL | 0 refills | Status: AC
Start: 2023-11-17 — End: ?

## 2023-11-17 NOTE — ED Triage Notes (Addendum)
 C/o swelling to knees bilateral and posterior ankle area that started 2 weeks ago. Denies any injury. Denies any recent illness. States yesterday her right knee "gave away"...denies falling.  denies any fevers or any other symptoms. Has taken ibuprofen  with no relief.

## 2023-11-17 NOTE — ED Provider Notes (Signed)
 [place] EMERGENCY DEPARTMENT AT [facility] Provider Note   CSN: [id number] Arrival date & time: 11/17/23  0408     History  Chief Complaint  Patient presents with   Leg Swelling    Mackenzie Patterson is a 53 y.o. female.  The history is provided by the patient.  Patient w/history of hypertension, hyperlipidemia, VSD presents with joint pain.  Patient reports ongoing bilateral knee and ankle pain for months.  She reports over the past week it has worsened.  Most of the pain is in her right knee.  She reports her knee "gave away" but she  did not fall or suffer any trauma She reports she had some swelling in her legs but that is improved.  No chest pain or shortness of breath.     Denies known history of VTE Home Medications Prior to Admission medications   Medication Sig Start Date End Date Taking? Authorizing Provider  meloxicam  (MOBIC ) 7.5 MG tablet Take 1 tablet (7.5 mg total) by mouth daily. 11/17/23  Yes Eldon Greenland, MD  Accu-Chek FastClix Lancets MISC USE 2 TIMES DAILY AS DIRECTED 07/26/19   Marius Siemens, NP  acyclovir  (ZOVIRAX ) 400 MG tablet Take 1 tablet (400 mg total) by mouth 2 (two) times daily. 04/27/22   Gabrielle Joiner, MD  albuterol  (ACCUNEB ) 0.63 MG/3ML nebulizer solution Take 1 ampule by nebulization 3 (three) times daily as needed for wheezing or shortness of breath. 08/05/22   [provider]  albuterol  (VENTOLIN  HFA) 108 (90 Base) MCG/ACT inhaler Inhale 2 puffs into the lungs every 6 (six) hours as needed for wheezing or shortness of breath. 01/27/20   Marius Siemens, NP  amLODipine  (NORVASC ) 5 MG tablet Take 1 tablet (5 mg total) by mouth daily. 03/24/21   Marius Siemens, NP  atorvastatin  (LIPITOR ) 40 MG tablet Take 40 mg by mouth daily. 02/14/21   [provider]  cetirizine  (ZYRTEC ) 10 MG tablet Take 1 tablet (10 mg total) by mouth daily. At bedtime as needed 05/06/21   Marius Siemens, NP  clopidogrel  (PLAVIX ) 75 MG  tablet Take 75 mg by mouth daily.    [provider]  fluticasone  (FLONASE ) 50 MCG/ACT nasal spray SPRAY 2 SPRAYS INTO EACH NOSTRIL EVERY DAY Patient taking differently: Place 2 sprays into both nostrils daily as needed for allergies or rhinitis. 03/25/21   Marius Siemens, NP  gabapentin  (NEURONTIN ) 300 MG capsule Take 1 capsule (300 mg total) by mouth 3 (three) times daily. 10/10/23   Yolanda Hence, CNM  glucose blood ([employer]S GLUCOSE METER TEST STRIPS) test strip Use as instructed 07/30/19   Marius Siemens, NP  JANUMET  50-1000 MG tablet TAKE 1 TABLET DAILY AFTER BREAKFAST 08/30/21   Marius Siemens, NP  magic mouthwash (nystatin , lidocaine , diphenhydrAMINE , alum & mag hydroxide) suspension Swish and spit 5 mLs 3 (three) times daily as needed for mouth pain. 05/21/22   Rosealee Concha, MD  SYMBICORT  160-4.5 MCG/ACT inhaler INHALE 2 PUFFS INTO THE LUNGS TWICE A DAY 03/09/21   Marius Siemens, NP  tacrolimus  (PROTOPIC ) 0.1 % ointment Apply topically 2 (two) times daily. Apply thin layer of 0.1% ointment to affected area twice daily 10/10/23   Lo, Donna K, CNM  triamcinolone  cream (KENALOG ) 0.1 % APPLY TO AFFECTED AREA TWICE A DAY 05/01/21   Marius Siemens, NP      Allergies    Penicillins, Tape, Other, Drug ingredient [benzonatate ], Vitamin k, and Latex    Review of  Systems   Review of Systems  Respiratory:  Negative for shortness of breath.   Cardiovascular:  Negative for chest pain.  Musculoskeletal:  Positive for arthralgias.    Physical Exam Updated Vital Signs BP (!) 120/98 (BP Location: Left Arm)   Pulse 81   Temp 97.9 F (36.6 C) (Oral)   Resp 15   Ht 1.473 m (4\' 10" )   Wt 73.5 kg   LMP 09/27/2022 Comment: POCT Urine negative 10/04/22  SpO2 98%   BMI 33.86 kg/m  Physical Exam CONSTITUTIONAL: Well developed/well nourished, no distress HEAD: Normocephalic/atraumatic CV: S1/S2 noted, loud murmur noted LUNGS: Lungs are clear to auscultation bilaterally, no  apparent distress ABDOMEN: soft, nontender, no rebound or guarding, bowel sounds noted throughout abdomen GU:no cva tenderness NEURO: Pt is awake/alert/appropriate, moves all extremitiesx4.  No facial droop.   EXTREMITIES: pulses normal/equal, full ROM Her legs are symmetric without any edema or erythema.  Distal pulses are equal and intact.  She has tenderness to palpation of both knees and tenderness is elicited with range of motion in both knees.  There is no joint effusion or erythema.  No significant tenderness noted to the right or left ankle.  Full range of motion of both lower extremities without difficulty SKIN: warm, color normal PSYCH: no abnormalities of mood noted, alert and oriented to situation  ED Results / Procedures / Treatments   Labs (all labs ordered are listed, but only abnormal results are displayed) Labs Reviewed - No data to display  EKG None  Radiology No results found.  Procedures Procedures    Medications Ordered in ED Medications - No data to display  ED Course/ Medical Decision Making/ A&P                                 Medical Decision Making Risk Prescription drug management.   Patient presents for joint pain in her legs for months but worse recently.  Most of the pain is now in her knees Patient reports she feels cracking at times in her knees. Patient could be developing arthritis.  I offered x-ray imaging but patient declines. Advised her to stop ibuprofen  and will do a limited course of meloxicam  and she can also continue Tylenol .  Given orthopedic and sports medicine referral.  No signs of DVT.  No signs of any recent traumatic injuries        Final Clinical Impression(s) / ED Diagnoses Final diagnoses:  Arthralgia of both knees    Rx / DC Orders ED Discharge Orders          Ordered    meloxicam  (MOBIC ) 7.5 MG tablet  Daily        11/17/23 0549              Eldon Greenland, MD 11/17/23 [phone]

## 2023-12-05 DIAGNOSIS — Q210 Ventricular septal defect: Secondary | ICD-10-CM | POA: Diagnosis not present

## 2023-12-05 DIAGNOSIS — Q2542 Hypoplasia of aorta: Secondary | ICD-10-CM | POA: Diagnosis not present

## 2023-12-08 DIAGNOSIS — Q210 Ventricular septal defect: Secondary | ICD-10-CM | POA: Diagnosis not present

## 2023-12-08 DIAGNOSIS — Z0001 Encounter for general adult medical examination with abnormal findings: Secondary | ICD-10-CM | POA: Diagnosis not present

## 2023-12-08 DIAGNOSIS — M25569 Pain in unspecified knee: Secondary | ICD-10-CM | POA: Diagnosis not present

## 2023-12-08 DIAGNOSIS — E119 Type 2 diabetes mellitus without complications: Secondary | ICD-10-CM | POA: Diagnosis not present

## 2023-12-08 DIAGNOSIS — Z6832 Body mass index (BMI) 32.0-32.9, adult: Secondary | ICD-10-CM | POA: Diagnosis not present

## 2024-01-06 ENCOUNTER — Other Ambulatory Visit: Payer: Self-pay

## 2024-01-06 ENCOUNTER — Emergency Department (HOSPITAL_BASED_OUTPATIENT_CLINIC_OR_DEPARTMENT_OTHER): Admission: EM | Admit: 2024-01-06 | Discharge: 2024-01-06 | Disposition: A | Discharge status: Home / Self Care

## 2024-01-06 ENCOUNTER — Encounter (HOSPITAL_BASED_OUTPATIENT_CLINIC_OR_DEPARTMENT_OTHER): Payer: Self-pay

## 2024-01-06 DIAGNOSIS — E1165 Type 2 diabetes mellitus with hyperglycemia: Secondary | ICD-10-CM | POA: Insufficient documentation

## 2024-01-06 DIAGNOSIS — Z91040 Latex allergy status: Secondary | ICD-10-CM | POA: Diagnosis not present

## 2024-01-06 DIAGNOSIS — R739 Hyperglycemia, unspecified: Secondary | ICD-10-CM | POA: Diagnosis present

## 2024-01-06 LAB — CBG MONITORING, ED
Glucose-Capillary: 231 mg/dL — ABNORMAL HIGH (ref 70–99)
Glucose-Capillary: 100 mg/dL — ABNORMAL HIGH (ref 70–99)

## 2024-01-06 MED ORDER — INSULIN ASPART PROT & ASPART (70-30 MIX) 100 UNIT/ML ~~LOC~~ SUSP
2.0000 [IU] | Freq: Once | SUBCUTANEOUS | Status: AC
  Administered 2024-01-06: 2 [IU] via SUBCUTANEOUS
  Filled 2024-01-06: qty 2

## 2024-01-06 NOTE — ED Notes (Signed)
 Reviewed AVS/discharge instruction with patient. Time allotted for and all questions answered. Patient is agreeable for d/c and escorted to ed exit by staff.

## 2024-01-06 NOTE — ED Triage Notes (Addendum)
 In for eval of elevated blood glucose yesterday. Attempted to touch base with PCP but has not received call back. Has been taking medications as directed. Has been prescribed ozempic but has not started medication due to the worry about side effects.

## 2024-01-06 NOTE — ED Provider Notes (Signed)
 [place] EMERGENCY DEPARTMENT AT [facility] Provider Note   CSN: [id number] Arrival date & time: 01/06/24  1036     Patient presents with: Hyperglycemia   Mackenzie Patterson is a 53 y.o. female with history of ventricular septal defect, type 2 diabetes, presents with concern for a elevated blood sugar reading at home.  States her blood sugars are normally in the 100s, but she took a reading today which was in the 200s.  Does report a mild headache, but denies any other symptoms such as vision change, abdominal pain, change in mental status, vomiting.  She reports she is currently on sitagliptin  and metformin  (Janumet ) for treatment of her diabetes, and her PCP just recently prescribed her Ozempic to use for further blood sugar control.  She has not started the Ozempic yet as she was concerned about possible side effects.    Hyperglycemia Associated symptoms: no abdominal pain        Prior to Admission medications   Medication Sig Start Date End Date Taking? Authorizing Provider  acyclovir  (ZOVIRAX ) 400 MG tablet Take 1 tablet (400 mg total) by mouth 2 (two) times daily. 04/27/22  Yes Rudy Carlin LABOR, MD  albuterol  (ACCUNEB ) 0.63 MG/3ML nebulizer solution Take 1 ampule by nebulization 3 (three) times daily as needed for wheezing or shortness of breath. 08/05/22  Yes [provider]  albuterol  (VENTOLIN  HFA) 108 (90 Base) MCG/ACT inhaler Inhale 2 puffs into the lungs every 6 (six) hours as needed for wheezing or shortness of breath. 01/27/20  Yes Celestia Rosaline SQUIBB, NP  amLODipine  (NORVASC ) 5 MG tablet Take 1 tablet (5 mg total) by mouth daily. 03/24/21  Yes Celestia Rosaline SQUIBB, NP  atorvastatin  (LIPITOR ) 40 MG tablet Take 40 mg by mouth daily. 02/14/21  Yes [provider]  cetirizine  (ZYRTEC ) 10 MG tablet Take 1 tablet (10 mg total) by mouth daily. At bedtime as needed 05/06/21  Yes Celestia Rosaline SQUIBB, NP  clopidogrel  (PLAVIX ) 75 MG tablet Take 75 mg by mouth daily.    Yes [provider]  JANUMET  50-1000 MG tablet TAKE 1 TABLET DAILY AFTER BREAKFAST 08/30/21  Yes Celestia Rosaline SQUIBB, NP  Accu-Chek FastClix Lancets MISC USE 2 TIMES DAILY AS DIRECTED 07/26/19   Celestia Rosaline SQUIBB, NP  fluticasone  (FLONASE ) 50 MCG/ACT nasal spray SPRAY 2 SPRAYS INTO EACH NOSTRIL EVERY DAY Patient taking differently: Place 2 sprays into both nostrils daily as needed for allergies or rhinitis. 03/25/21   Celestia Rosaline SQUIBB, NP  gabapentin  (NEURONTIN ) 300 MG capsule Take 1 capsule (300 mg total) by mouth 3 (three) times daily. 10/10/23   Tad Arland POUR, CNM  glucose blood ([employer] GLUCOSE METER TEST STRIPS) test strip Use as instructed 07/30/19   Celestia Rosaline SQUIBB, NP  magic mouthwash (nystatin , lidocaine , diphenhydrAMINE , alum & mag hydroxide) suspension Swish and spit 5 mLs 3 (three) times daily as needed for mouth pain. 05/21/22   Jerrol Agent, MD  meloxicam  (MOBIC ) 7.5 MG tablet Take 1 tablet (7.5 mg total) by mouth daily. 11/17/23   Midge Golas, MD  SYMBICORT  160-4.5 MCG/ACT inhaler INHALE 2 PUFFS INTO THE LUNGS TWICE A DAY 03/09/21   Celestia Rosaline SQUIBB, NP  tacrolimus  (PROTOPIC ) 0.1 % ointment Apply topically 2 (two) times daily. Apply thin layer of 0.1% ointment to affected area twice daily 10/10/23   Lo, Donna K, CNM  triamcinolone  cream (KENALOG ) 0.1 % APPLY TO AFFECTED AREA TWICE A DAY 05/01/21   Celestia Rosaline SQUIBB, NP    Allergies: Penicillins, Tape,  Other, Drug ingredient [benzonatate ], Metformin  and related, Vitamin k, and Latex    Review of Systems  Constitutional:  Negative for appetite change.  Gastrointestinal:  Negative for abdominal pain.    Updated Vital Signs BP 131/77   Pulse 85   Temp 98.4 F (36.9 C)   Resp 18   Ht 4' 10 (1.473 m)   Wt 74.8 kg   LMP 09/27/2022 Comment: POCT Urine negative 10/04/22  SpO2 97%   BMI 34.49 kg/m   Physical Exam Vitals and nursing note reviewed.  Constitutional:      General: She is not in acute distress.     Appearance: Normal appearance.     Comments: Well-appearing  HENT:     Head: Atraumatic.  Cardiovascular:     Rate and Rhythm: Normal rate and regular rhythm.  Pulmonary:     Effort: Pulmonary effort is normal.     Breath sounds: Normal breath sounds.  Abdominal:     General: Abdomen is flat.     Palpations: Abdomen is soft.     Tenderness: There is no abdominal tenderness.  Neurological:     General: No focal deficit present.     Mental Status: She is alert.  Psychiatric:        Mood and Affect: Mood normal.        Behavior: Behavior normal.     (all labs ordered are listed, but only abnormal results are displayed) Labs Reviewed  CBG MONITORING, ED - Abnormal; Notable for the following components:      Result Value   Glucose-Capillary 231 (*)    All other components within normal limits  CBG MONITORING, ED - Abnormal; Notable for the following components:   Glucose-Capillary 100 (*)    All other components within normal limits    EKG: None  Radiology: No results found.   Procedures   Medications Ordered in the ED  insulin  aspart protamine- aspart (NOVOLOG  MIX 70/30) injection 2 Units (2 Units Subcutaneous Given 01/06/24 1225)                                    Medical Decision Making Risk Prescription drug management.     Differential diagnosis includes but is not limited to hyperglycemia, dehydration, DKA, HHS  ED Course:  Upon initial evaluation, patient is very well-appearing, no acute distress.  Stable vitals.  CBG upon arrival here at 231.  Patient denying any symptoms aside from a mild frontal headache.  No abdominal pain, vomiting, vision changes.  She is very alert and oriented.  Given only mildly elevated CBG and well appearance, I have low concern for DKA, HHS, or other pathology at this time.  No indication for further labs at this time.  Labs Ordered: I Ordered, and personally interpreted labs.  The pertinent results include:   Initial CBG  upon arrival at 231, improved to 100 after insulin    Medications Given: Insulin  aspart  Upon re-evaluation, patient mains well-appearing with stable vitals.  Insulin  improved to 100 after giving aspart.  We discussed that she should continue with her home diabetes medications as prescribed.  Stable and appropriate for discharge home    Impression: Hyperglycemia  Disposition:  The patient was discharged home with instructions to take her Janumet  and Ozempic as prescribed by her PCP for her diabetes.  Keep well-hydrated at home with water.  Follow-up with her PCP if she has further concerns  regarding her blood sugar and/or medications within the next week. Return precautions given.     This chart was dictated using voice recognition software, Dragon. Despite the best efforts of this provider to proofread and correct errors, errors may still occur which can change documentation meaning.       Final diagnoses:  Hyperglycemia due to diabetes mellitus [facility])    ED Discharge Orders     None          Veta Palma, PA-C 01/06/24 1302    Kammerer, Megan L, DO 01/07/24 [phone]

## 2024-01-06 NOTE — Discharge Instructions (Addendum)
 Please continue taking your Janumet  at home as prescribed for your diabetes.  You may take your Ozempic as prescribed.  Please keep well-hydrated at home with water.  Please follow-up with your PCP within the next week if you have further concerns regarding your blood sugar.  Please return to the emergency room if you have abdominal pain, vomiting, you feel very tired, any other new or concerning symptoms

## 2024-03-05 ENCOUNTER — Encounter (HOSPITAL_BASED_OUTPATIENT_CLINIC_OR_DEPARTMENT_OTHER): Payer: Self-pay | Admitting: Emergency Medicine

## 2024-03-05 ENCOUNTER — Emergency Department (HOSPITAL_BASED_OUTPATIENT_CLINIC_OR_DEPARTMENT_OTHER)
Admission: EM | Admit: 2024-03-05 | Discharge: 2024-03-05 | Disposition: A | Discharge status: Home / Self Care | Attending: Emergency Medicine | Admitting: Emergency Medicine

## 2024-03-05 DIAGNOSIS — Z7984 Long term (current) use of oral hypoglycemic drugs: Secondary | ICD-10-CM | POA: Insufficient documentation

## 2024-03-05 DIAGNOSIS — E119 Type 2 diabetes mellitus without complications: Secondary | ICD-10-CM | POA: Insufficient documentation

## 2024-03-05 DIAGNOSIS — Z91040 Latex allergy status: Secondary | ICD-10-CM | POA: Diagnosis not present

## 2024-03-05 DIAGNOSIS — K1379 Other lesions of oral mucosa: Secondary | ICD-10-CM | POA: Insufficient documentation

## 2024-03-05 DIAGNOSIS — Z7902 Long term (current) use of antithrombotics/antiplatelets: Secondary | ICD-10-CM | POA: Insufficient documentation

## 2024-03-05 DIAGNOSIS — B009 Herpesviral infection, unspecified: Secondary | ICD-10-CM

## 2024-03-05 MED ORDER — VALACYCLOVIR HCL 1 G PO TABS: 1000.0000 mg | ORAL_TABLET | Freq: Two times a day (BID) | ORAL | 0 refills | Status: AC

## 2024-03-05 MED ORDER — NYSTATIN 100000 UNIT/ML MT SUSP: 5.0000 mL | Freq: Three times a day (TID) | OROMUCOSAL | 1 refills | Status: AC | PRN

## 2024-03-05 NOTE — Discharge Instructions (Signed)
 Please read and follow all provided instructions.  Your diagnoses today include:  1. Mouth sores   2. Herpes     Tests performed today include: Vital signs. See below for your results today.   Medications prescribed:  Valtrex : Medication to treat viral cause of the sores on your lip  Magic mouthwash: Medication for mouth pain  Take any prescribed medications only as directed.  Home care instructions:  Follow any educational materials contained in this packet.  BE VERY CAREFUL not to take multiple medicines containing Tylenol  (also called acetaminophen ). Doing so can lead to an overdose which can damage your liver and cause liver failure and possibly death.   Follow-up instructions: Please follow-up with your primary care provider in the next 7 days for further evaluation of your symptoms.   Return instructions:  Please return to the Emergency Department if you experience worsening symptoms.  Please return if you have any other emergent concerns.  Additional Information:  Your vital signs today were: BP 124/80   Pulse 77   Temp (!) 97.1 F (36.2 C) (Tympanic)   Resp 15   LMP 09/27/2022 Comment: POCT Urine negative 10/04/22  SpO2 100%  If your blood pressure (BP) was elevated above 135/85 this visit, please have this repeated by your doctor within one month. --------------

## 2024-03-05 NOTE — ED Provider Notes (Signed)
 [place] EMERGENCY DEPARTMENT AT [facility] Provider Note   CSN: [id number] Arrival date & time: 03/05/24  9050     Patient presents with: Mouth Lesions   Mackenzie Patterson is a 53 y.o. female.   Patient presents with recurrent sores to her mouth.  She states that these occur on average about every 3 months.  She has a history of diabetes but states that her sugar was about 120 this morning.  Patient has been seen in the emergency department for this in the past.  She has been prescribed Valtrex  and Magic mouthwash.  She states that these treatments have tended to help.  She denies associated fever.  She denies rash on the skin, including palms and soles.  She has not had any URI symptoms.  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       Prior to Admission medications   Medication Sig Start Date End Date Taking? Authorizing Provider  valACYclovir  (VALTREX ) 1000 MG tablet Take 1 tablet (1,000 mg total) by mouth 2 (two) times daily for 7 days. 03/05/24 03/12/24 Yes Desiderio Chew, PA-C  Accu-Chek FastClix Lancets MISC USE 2 TIMES DAILY AS DIRECTED 07/26/19   Celestia Rosaline SQUIBB, NP  acyclovir  (ZOVIRAX ) 400 MG tablet Take 1 tablet (400 mg total) by mouth 2 (two) times daily. 04/27/22   Rudy Carlin LABOR, MD  albuterol  (ACCUNEB ) 0.63 MG/3ML nebulizer solution Take 1 ampule by nebulization 3 (three) times daily as needed for wheezing or shortness of breath. 08/05/22   [provider]  albuterol  (VENTOLIN  HFA) 108 (90 Base) MCG/ACT inhaler Inhale 2 puffs into the lungs every 6 (six) hours as needed for wheezing or shortness of breath. 01/27/20   Celestia Rosaline SQUIBB, NP  amLODipine  (NORVASC ) 5 MG tablet Take 1 tablet (5 mg total) by mouth daily. 03/24/21   Celestia Rosaline SQUIBB, NP  atorvastatin  (LIPITOR ) 40 MG tablet Take 40 mg by mouth daily. 02/14/21   [provider]  cetirizine  (ZYRTEC ) 10 MG tablet Take 1 tablet (10 mg total) by mouth  daily. At bedtime as needed 05/06/21   Celestia Rosaline SQUIBB, NP  clopidogrel  (PLAVIX ) 75 MG tablet Take 75 mg by mouth daily.    [provider]  fluticasone  (FLONASE ) 50 MCG/ACT nasal spray SPRAY 2 SPRAYS INTO EACH NOSTRIL EVERY DAY Patient taking differently: Place 2 sprays into both nostrils daily as needed for allergies or rhinitis. 03/25/21   Celestia Rosaline SQUIBB, NP  gabapentin  (NEURONTIN ) 300 MG capsule Take 1 capsule (300 mg total) by mouth 3 (three) times daily. 10/10/23   Tad Arland POUR, CNM  glucose blood ([employer] GLUCOSE METER TEST STRIPS) test strip Use as instructed 07/30/19   Celestia Rosaline SQUIBB, NP  JANUMET  50-1000 MG tablet TAKE 1 TABLET DAILY AFTER BREAKFAST 08/30/21   Celestia Rosaline SQUIBB, NP  magic mouthwash (nystatin , lidocaine , diphenhydrAMINE , alum & mag hydroxide) suspension Swish and spit 5 mLs 3 (three) times daily as needed for mouth pain. 03/05/24   Brynne Doane, PA-C  meloxicam  (MOBIC ) 7.5 MG tablet Take 1 tablet (7.5 mg total) by mouth daily. 11/17/23   Midge Golas, MD  SYMBICORT  160-4.5 MCG/ACT inhaler INHALE 2 PUFFS INTO THE LUNGS TWICE A DAY 03/09/21   Celestia Rosaline SQUIBB, NP  tacrolimus  (PROTOPIC ) 0.1 % ointment Apply topically 2 (two) times daily. Apply thin layer of 0.1% ointment to affected area twice daily 10/10/23   Lo, Donna K, CNM  triamcinolone  cream (KENALOG )  0.1 % APPLY TO AFFECTED AREA TWICE A DAY 05/01/21   Celestia Rosaline SQUIBB, NP    Allergies: Penicillins, Tape, Other, Drug ingredient [benzonatate ], Metformin  and related, Vitamin k, and Latex    Review of Systems  Updated Vital Signs BP 124/80   Pulse 77   Temp (!) 97.1 F (36.2 C) (Tympanic)   Resp 15   LMP 09/27/2022 Comment: POCT Urine negative 10/04/22  SpO2 100%   Physical Exam Vitals and nursing note reviewed.  Constitutional:      Appearance: She is well-developed.  HENT:     Head: Normocephalic and atraumatic.     Nose: Nose normal.     Mouth/Throat:     Mouth: Mucous membranes are  moist.     Comments: There are some mild ulcerations to the inside of the upper lip.  These are tender when the patient lifts her upper lip.  No lip swelling.  I do not see any other oral lesions. Eyes:     Conjunctiva/sclera: Conjunctivae normal.  Pulmonary:     Effort: No respiratory distress.  Musculoskeletal:     Cervical back: Normal range of motion and neck supple.  Skin:    General: Skin is warm and dry.  Neurological:     Mental Status: She is alert.     (all labs ordered are listed, but only abnormal results are displayed) Labs Reviewed - No data to display  EKG: None  Radiology: No results found.   Procedures   Medications Ordered in the ED - No data to display  ED Course  Patient seen and examined. History obtained directly from patient.   Labs/EKG: None  Imaging: None  Medications/Fluids: None  Most recent vital signs reviewed and are as follows: BP 124/80   Pulse 77   Temp (!) 97.1 F (36.2 C) (Tympanic)   Resp 15   LMP 09/27/2022 Comment: POCT Urine negative 10/04/22  SpO2 100%   Initial impression: Recurrent oral labial ulceration, in the past responsive to antiviral.  Home treatment plan: OTC meds for pain, Magic mouthwash, trial Valtrex  twice daily x 1 week  Return instructions discussed with patient: New or worsening symptoms  Follow-up instructions discussed with patient: 1 week with PCP                                     Medical Decision Making Risk Prescription drug management.   Recurrent mouth sores, ulcerations.  Do not suspect diabetic emergency at this time.  Patient response of the Valtrex  in the past.  Will try this again and have patient follow-up with her PCP.     Final diagnoses:  Mouth sores    ED Discharge Orders          Ordered    magic mouthwash (nystatin , lidocaine , diphenhydrAMINE , alum & mag hydroxide) suspension  3 times daily PRN        03/05/24 1035    valACYclovir  (VALTREX ) 1000 MG tablet  2  times daily        03/05/24 1035               Desiderio Chew, PA-C 03/05/24 1040    Jerrol Agent, MD 03/05/24 1145

## 2024-03-05 NOTE — ED Triage Notes (Signed)
 Pt c/o oral sores for about a week. Painful when eating and saliva increasing since yesterday. Afebrile. VSS. Pt states she has a hx of oral sores

## 2024-03-05 NOTE — ED Notes (Signed)
 Pt given discharge instructions and reviewed prescriptions. Opportunities given for questions. Pt verbalizes understanding. Jillyn Hidden, RN

## 2024-03-17 ENCOUNTER — Emergency Department (HOSPITAL_BASED_OUTPATIENT_CLINIC_OR_DEPARTMENT_OTHER): Admission: EM | Admit: 2024-03-17 | Discharge: 2024-03-17 | Disposition: A | Discharge status: Home / Self Care

## 2024-03-17 DIAGNOSIS — Z91040 Latex allergy status: Secondary | ICD-10-CM | POA: Insufficient documentation

## 2024-03-17 DIAGNOSIS — L237 Allergic contact dermatitis due to plants, except food: Secondary | ICD-10-CM | POA: Insufficient documentation

## 2024-03-17 DIAGNOSIS — R21 Rash and other nonspecific skin eruption: Secondary | ICD-10-CM | POA: Diagnosis present

## 2024-03-17 MED ORDER — HYDROXYZINE HCL 25 MG PO TABS
25.0000 mg | ORAL_TABLET | Freq: Once | ORAL | Status: AC
  Administered 2024-03-17: 25 mg via ORAL
  Filled 2024-03-17: qty 1

## 2024-03-17 MED ORDER — HYDROXYZINE HCL 25 MG PO TABS: 25.0000 mg | ORAL_TABLET | Freq: Four times a day (QID) | ORAL | 0 refills | Status: AC

## 2024-03-17 NOTE — ED Provider Notes (Signed)
 [place] EMERGENCY DEPARTMENT AT [facility] Provider Note   CSN: [id number] Arrival date & time: 03/17/24  9155     Patient presents with: Rash   Mackenzie Patterson is a 53 y.o. female.   HPI     Endorses rash on bilateral arms and bilateral legs.  Happened yesterday after cutting trees with significant other.  Took a shower afterwards but still has some itching to the area.  No nausea or vomiting.  No abdominal pain.  No lightheaded events.  No wheezing and no.  Shortness of breath.  Patient did not take a thing for this.  Patient states that she has allergy to both Benadryl  as well as steroids.  She states that Benadryl  made her itch in the past and steroids feels like it tgave her a rash.   Previous medical history reviewed : Patient last seen in the ED on September 9.  Concern for oral herpes at that time.  Prior to Admission medications   Medication Sig Start Date End Date Taking? Authorizing Provider  hydrOXYzine  (ATARAX ) 25 MG tablet Take 1 tablet (25 mg total) by mouth every 6 (six) hours. Take as needed for itching 03/17/24  Yes Simon Lavonia SAILOR, MD  Accu-Chek FastClix Lancets MISC USE 2 TIMES DAILY AS DIRECTED 07/26/19   Celestia Rosaline SQUIBB, NP  acyclovir  (ZOVIRAX ) 400 MG tablet Take 1 tablet (400 mg total) by mouth 2 (two) times daily. 04/27/22   Rudy Carlin LABOR, MD  albuterol  (ACCUNEB ) 0.63 MG/3ML nebulizer solution Take 1 ampule by nebulization 3 (three) times daily as needed for wheezing or shortness of breath. 08/05/22   [provider]  albuterol  (VENTOLIN  HFA) 108 (90 Base) MCG/ACT inhaler Inhale 2 puffs into the lungs every 6 (six) hours as needed for wheezing or shortness of breath. 01/27/20   Celestia Rosaline SQUIBB, NP  amLODipine  (NORVASC ) 5 MG tablet Take 1 tablet (5 mg total) by mouth daily. 03/24/21   Celestia Rosaline SQUIBB, NP  atorvastatin  (LIPITOR ) 40 MG tablet Take 40 mg by mouth daily. 02/14/21   [provider]  cetirizine  (ZYRTEC ) 10 MG tablet  Take 1 tablet (10 mg total) by mouth daily. At bedtime as needed 05/06/21   Celestia Rosaline SQUIBB, NP  clopidogrel  (PLAVIX ) 75 MG tablet Take 75 mg by mouth daily.    [provider]  fluticasone  (FLONASE ) 50 MCG/ACT nasal spray SPRAY 2 SPRAYS INTO EACH NOSTRIL EVERY DAY Patient taking differently: Place 2 sprays into both nostrils daily as needed for allergies or rhinitis. 03/25/21   Celestia Rosaline SQUIBB, NP  gabapentin  (NEURONTIN ) 300 MG capsule Take 1 capsule (300 mg total) by mouth 3 (three) times daily. 10/10/23   Tad Arland POUR, CNM  glucose blood ([employer] GLUCOSE METER TEST STRIPS) test strip Use as instructed 07/30/19   Celestia Rosaline SQUIBB, NP  JANUMET  50-1000 MG tablet TAKE 1 TABLET DAILY AFTER BREAKFAST 08/30/21   Celestia Rosaline SQUIBB, NP  magic mouthwash (nystatin , lidocaine , diphenhydrAMINE , alum & mag hydroxide) suspension Swish and spit 5 mLs 3 (three) times daily as needed for mouth pain. 03/05/24   Geiple, Joshua, PA-C  meloxicam  (MOBIC ) 7.5 MG tablet Take 1 tablet (7.5 mg total) by mouth daily. 11/17/23   Midge Golas, MD  SYMBICORT  160-4.5 MCG/ACT inhaler INHALE 2 PUFFS INTO THE LUNGS TWICE A DAY 03/09/21   Celestia Rosaline SQUIBB, NP  tacrolimus  (PROTOPIC ) 0.1 % ointment Apply topically 2 (two) times daily. Apply thin layer of 0.1% ointment to affected area twice daily 10/10/23  Tad Arland POUR, CNM  triamcinolone  cream (KENALOG ) 0.1 % APPLY TO AFFECTED AREA TWICE A DAY 05/01/21   Celestia Rosaline SQUIBB, NP    Allergies: Penicillins, Tape, Other, Benadryl  [diphenhydramine ], Drug ingredient [benzonatate ], Metformin  and related, Vitamin k, and Latex    Review of Systems  Constitutional:  Negative for chills and fever.  HENT:  Negative for ear pain and sore throat.   Eyes:  Negative for pain and visual disturbance.  Respiratory:  Negative for cough and shortness of breath.   Cardiovascular:  Negative for chest pain and palpitations.  Gastrointestinal:  Negative for abdominal pain and vomiting.   Genitourinary:  Negative for dysuria and hematuria.  Musculoskeletal:  Negative for arthralgias and back pain.  Skin:  Negative for color change and rash.  Neurological:  Negative for seizures and syncope.  All other systems reviewed and are negative.   Updated Vital Signs BP 122/73 (BP Location: Right Arm)   Pulse 85   Temp 98.1 F (36.7 C) (Oral)   Resp 15   LMP 09/27/2022 Comment: POCT Urine negative 10/04/22  SpO2 99%   Physical Exam Vitals and nursing note reviewed.  Constitutional:      General: She is not in acute distress.    Appearance: She is well-developed.  HENT:     Head: Normocephalic and atraumatic.  Eyes:     Conjunctiva/sclera: Conjunctivae normal.  Cardiovascular:     Rate and Rhythm: Normal rate and regular rhythm.     Heart sounds: No murmur heard. Pulmonary:     Effort: Pulmonary effort is normal. No respiratory distress.     Breath sounds: Normal breath sounds.  Abdominal:     Palpations: Abdomen is soft.     Tenderness: There is no abdominal tenderness.  Musculoskeletal:        General: No swelling.     Cervical back: Neck supple.  Skin:    General: Skin is warm and dry.     Capillary Refill: Capillary refill takes less than 2 seconds.  Neurological:     Mental Status: She is alert.  Psychiatric:        Mood and Affect: Mood normal.     (all labs ordered are listed, but only abnormal results are displayed) Labs Reviewed - No data to display  EKG: None  Radiology: No results found.   Procedures   Medications Ordered in the ED  hydrOXYzine  (ATARAX ) tablet 25 mg (25 mg Oral Given 03/17/24 0903)                                    Medical Decision Making Risk Prescription drug management.     Endorses rash on bilateral arms and bilateral legs.  Happened yesterday after cutting trees with significant other.  Took a shower afterwards but still has some itching to the area.  No nausea or vomiting.  No abdominal pain.  No  lightheaded events.  No wheezing and no.  Shortness of breath.  Patient did not take a thing for this.  Patient states that she has allergy to both Benadryl  as well as steroids.  She states that Benadryl  made her itch in the past and steroids feels like it tgave her a rash.   Previous medical history reviewed : Patient last seen in the ED on September 9.  Concern for oral herpes at that time.   Upon exam, patient no acute distress.  Hemodynamically stable.  ANO x 3 GCS 15.  Maps appropriate.  No wheezing rales or rhonchi.  Soft benign abdomen.  Could not really appreciate a rash either.  No swelling of the airway.  No construction, anaphylactic reaction this point time.  Likely a contact dermatitis.  Somewhat limited in terms of what we can do in terms of intervention given her reported allergies to both Benadryl  as well as steroids.  Will call in Atarax  to see this helps with the itching.       Final diagnoses:  Allergic contact dermatitis due to plants, except food    ED Discharge Orders          Ordered    hydrOXYzine  (ATARAX ) 25 MG tablet  Every 6 hours        03/17/24 0857               Simon Lavonia SAILOR, MD 03/17/24 [phone]

## 2024-03-17 NOTE — Discharge Instructions (Addendum)
 Make sure you take a good shower when you get home.  You can try the hydroxyzine  for any, itching.  If you have any kind of difficulty breathing, abdominal pain with nausea or vomiting, lightheaded events then please come back to the ED immediately.  There is no indication for anaphylaxis at this time.  This seems more like a contact dermatitis.

## 2024-03-17 NOTE — ED Triage Notes (Signed)
 Pt reports raised itchy rash on bilateral legs and left arm since yesterday after doing yardwork.  Pt denies any oral, facial swelling or difficulty breathing.  Pt has not taken any OTC medications at home for rash.

## 2024-03-21 DIAGNOSIS — Z23 Encounter for immunization: Secondary | ICD-10-CM | POA: Diagnosis not present

## 2024-03-21 DIAGNOSIS — Q210 Ventricular septal defect: Secondary | ICD-10-CM | POA: Diagnosis not present

## 2024-04-08 ENCOUNTER — Ambulatory Visit: Admitting: Physician Assistant

## 2024-04-09 DIAGNOSIS — D649 Anemia, unspecified: Secondary | ICD-10-CM | POA: Diagnosis not present

## 2024-04-09 DIAGNOSIS — E119 Type 2 diabetes mellitus without complications: Secondary | ICD-10-CM | POA: Diagnosis not present

## 2024-04-09 DIAGNOSIS — Q210 Ventricular septal defect: Secondary | ICD-10-CM | POA: Diagnosis not present

## 2024-04-30 ENCOUNTER — Emergency Department (HOSPITAL_BASED_OUTPATIENT_CLINIC_OR_DEPARTMENT_OTHER)
Admission: EM | Admit: 2024-04-30 | Discharge: 2024-05-01 | Disposition: A | Discharge status: Home / Self Care | Attending: Emergency Medicine | Admitting: Emergency Medicine

## 2024-04-30 ENCOUNTER — Emergency Department (HOSPITAL_BASED_OUTPATIENT_CLINIC_OR_DEPARTMENT_OTHER): Admitting: Radiology

## 2024-04-30 ENCOUNTER — Encounter (HOSPITAL_BASED_OUTPATIENT_CLINIC_OR_DEPARTMENT_OTHER): Payer: Self-pay | Admitting: Emergency Medicine

## 2024-04-30 DIAGNOSIS — E119 Type 2 diabetes mellitus without complications: Secondary | ICD-10-CM | POA: Insufficient documentation

## 2024-04-30 DIAGNOSIS — I10 Essential (primary) hypertension: Secondary | ICD-10-CM | POA: Insufficient documentation

## 2024-04-30 DIAGNOSIS — R0789 Other chest pain: Secondary | ICD-10-CM | POA: Diagnosis not present

## 2024-04-30 DIAGNOSIS — Z7901 Long term (current) use of anticoagulants: Secondary | ICD-10-CM | POA: Insufficient documentation

## 2024-04-30 DIAGNOSIS — J4489 Other specified chronic obstructive pulmonary disease: Secondary | ICD-10-CM | POA: Diagnosis not present

## 2024-04-30 DIAGNOSIS — Z91040 Latex allergy status: Secondary | ICD-10-CM | POA: Diagnosis not present

## 2024-04-30 DIAGNOSIS — I517 Cardiomegaly: Secondary | ICD-10-CM | POA: Diagnosis not present

## 2024-04-30 DIAGNOSIS — Z79899 Other long term (current) drug therapy: Secondary | ICD-10-CM | POA: Insufficient documentation

## 2024-04-30 DIAGNOSIS — R079 Chest pain, unspecified: Secondary | ICD-10-CM | POA: Diagnosis not present

## 2024-04-30 LAB — CBC
HCT: 39.6 % (ref 36.0–46.0)
Hemoglobin: 13.4 g/dL (ref 12.0–15.0)
MCH: 29.7 pg (ref 26.0–34.0)
MCHC: 33.8 g/dL (ref 30.0–36.0)
MCV: 87.8 fL (ref 80.0–100.0)
Platelets: 205 K/uL (ref 150–400)
RBC: 4.51 MIL/uL (ref 3.87–5.11)
RDW: 13.1 % (ref 11.5–15.5)
WBC: 5.1 K/uL (ref 4.0–10.5)
nRBC: 0 % (ref 0.0–0.2)

## 2024-04-30 NOTE — ED Triage Notes (Signed)
 Chest pain. Suspects from helping son move Friday. Reports became much worse today. Central cp radiating into right shoulder. HX septal defect. Takes plavix .

## 2024-05-01 LAB — BASIC METABOLIC PANEL WITH GFR
Anion gap: 9 (ref 5–15)
BUN: 9 mg/dL (ref 6–20)
CO2: 25 mmol/L (ref 22–32)
Calcium: 9.8 mg/dL (ref 8.9–10.3)
Chloride: 103 mmol/L (ref 98–111)
Creatinine, Ser: 0.75 mg/dL (ref 0.44–1.00)
GFR, Estimated: >60 mL/min (ref >60)
Glucose, Bld: 228 mg/dL — ABNORMAL HIGH (ref 70–99)
Potassium: 4.2 mmol/L (ref 3.5–5.1)
Sodium: 137 mmol/L (ref 135–145)

## 2024-05-01 LAB — TROPONIN T, HIGH SENSITIVITY
Troponin T High Sensitivity: <15 ng/L (ref 0–19)
Troponin T High Sensitivity: <15 ng/L (ref 0–19)

## 2024-05-01 MED ORDER — NAPROXEN 500 MG PO TABS: 500.0000 mg | ORAL_TABLET | Freq: Two times a day (BID) | ORAL | 0 refills | Status: DC

## 2024-05-01 MED ORDER — KETOROLAC TROMETHAMINE 30 MG/ML IJ SOLN
30.0000 mg | Freq: Once | INTRAMUSCULAR | Status: AC
  Administered 2024-05-01: 30 mg via INTRAMUSCULAR
  Filled 2024-05-01: qty 1

## 2024-05-01 NOTE — ED Provider Notes (Signed)
 [place] EMERGENCY DEPARTMENT AT [facility] Provider Note   CSN: [id number] Arrival date & time: 04/30/24  2314     Patient presents with: Chest Pain   Mackenzie Patterson is a 53 y.o. female.   HPI     This is a 53 year old female who presents with chest pain.  Patient reports 4-day history of anterior chest pain that has been constant and worsening.  States that she was helping her son move and was lifting heavy objects at onset of symptoms.  It is worse when she moves.  No shortness of breath or sweating.  Denies fevers.  Prior to Admission medications   Medication Sig Start Date End Date Taking? Authorizing Provider  naproxen  (NAPROSYN ) 500 MG tablet Take 1 tablet (500 mg total) by mouth 2 (two) times daily. 05/01/24  Yes Esther Broyles, Charmaine FALCON, MD  Accu-Chek FastClix Lancets MISC USE 2 TIMES DAILY AS DIRECTED 07/26/19   Celestia Rosaline SQUIBB, NP  acyclovir  (ZOVIRAX ) 400 MG tablet Take 1 tablet (400 mg total) by mouth 2 (two) times daily. 04/27/22   Rudy Carlin LABOR, MD  albuterol  (ACCUNEB ) 0.63 MG/3ML nebulizer solution Take 1 ampule by nebulization 3 (three) times daily as needed for wheezing or shortness of breath. 08/05/22   [provider]  albuterol  (VENTOLIN  HFA) 108 (90 Base) MCG/ACT inhaler Inhale 2 puffs into the lungs every 6 (six) hours as needed for wheezing or shortness of breath. 01/27/20   Celestia Rosaline SQUIBB, NP  amLODipine  (NORVASC ) 5 MG tablet Take 1 tablet (5 mg total) by mouth daily. 03/24/21   Celestia Rosaline SQUIBB, NP  atorvastatin  (LIPITOR ) 40 MG tablet Take 40 mg by mouth daily. 02/14/21   [provider]  cetirizine  (ZYRTEC ) 10 MG tablet Take 1 tablet (10 mg total) by mouth daily. At bedtime as needed 05/06/21   Celestia Rosaline SQUIBB, NP  clopidogrel  (PLAVIX ) 75 MG tablet Take 75 mg by mouth daily.    [provider]  fluticasone  (FLONASE ) 50 MCG/ACT nasal spray SPRAY 2 SPRAYS INTO EACH NOSTRIL EVERY DAY Patient taking differently: Place 2  sprays into both nostrils daily as needed for allergies or rhinitis. 03/25/21   Celestia Rosaline SQUIBB, NP  gabapentin  (NEURONTIN ) 300 MG capsule Take 1 capsule (300 mg total) by mouth 3 (three) times daily. 10/10/23   Tad Arland POUR, CNM  glucose blood ([employer] GLUCOSE METER TEST STRIPS) test strip Use as instructed 07/30/19   Celestia Rosaline SQUIBB, NP  hydrOXYzine  (ATARAX ) 25 MG tablet Take 1 tablet (25 mg total) by mouth every 6 (six) hours. Take as needed for itching 03/17/24   Simon Lavonia SAILOR, MD  JANUMET  50-1000 MG tablet TAKE 1 TABLET DAILY AFTER BREAKFAST 08/30/21   Celestia Rosaline SQUIBB, NP  magic mouthwash (nystatin , lidocaine , diphenhydrAMINE , alum & mag hydroxide) suspension Swish and spit 5 mLs 3 (three) times daily as needed for mouth pain. 03/05/24   Geiple, Joshua, PA-C  meloxicam  (MOBIC ) 7.5 MG tablet Take 1 tablet (7.5 mg total) by mouth daily. 11/17/23   Midge Golas, MD  SYMBICORT  160-4.5 MCG/ACT inhaler INHALE 2 PUFFS INTO THE LUNGS TWICE A DAY 03/09/21   Celestia Rosaline SQUIBB, NP  tacrolimus  (PROTOPIC ) 0.1 % ointment Apply topically 2 (two) times daily. Apply thin layer of 0.1% ointment to affected area twice daily 10/10/23   Lo, Donna K, CNM  triamcinolone  cream (KENALOG ) 0.1 % APPLY TO AFFECTED AREA TWICE A DAY 05/01/21   Celestia Rosaline SQUIBB, NP    Allergies: Penicillins, Tape, Other, Benadryl  [  diphenhydramine ], Drug ingredient [benzonatate ], Metformin  and related, Vitamin k, and Latex    Review of Systems  Constitutional:  Negative for fever.  Respiratory:  Negative for shortness of breath.   Cardiovascular:  Positive for chest pain. Negative for leg swelling.  Gastrointestinal:  Negative for abdominal pain.  All other systems reviewed and are negative.   Updated Vital Signs BP (!) 143/84   Pulse 86   Temp (!) 97.5 F (36.4 C)   Resp 19   LMP 09/27/2022 Comment: POCT Urine negative 10/04/22  SpO2 95%   Physical Exam Vitals and nursing note reviewed.  Constitutional:      Appearance:  She is well-developed. She is obese. She is not ill-appearing.  HENT:     Head: Normocephalic and atraumatic.  Eyes:     Pupils: Pupils are equal, round, and reactive to light.  Cardiovascular:     Rate and Rhythm: Normal rate and regular rhythm.     Heart sounds: Normal heart sounds.  Pulmonary:     Effort: Pulmonary effort is normal. No respiratory distress.     Breath sounds: No wheezing.  Chest:     Chest wall: Tenderness present.  Abdominal:     Palpations: Abdomen is soft.  Musculoskeletal:     Cervical back: Neck supple.  Skin:    General: Skin is warm and dry.  Neurological:     Mental Status: She is alert and oriented to person, place, and time.  Psychiatric:        Mood and Affect: Mood normal.     (all labs ordered are listed, but only abnormal results are displayed) Labs Reviewed  BASIC METABOLIC PANEL WITH GFR - Abnormal; Notable for the following components:      Result Value   Glucose, Bld 228 (*)    All other components within normal limits  CBC  TROPONIN T, HIGH SENSITIVITY  TROPONIN T, HIGH SENSITIVITY    EKG: None  Radiology: DG Chest 2 View Result Date: 04/30/2024 EXAM: 2 VIEW(S) XRAY OF THE CHEST 04/30/2024 11:43:00 PM COMPARISON: 05/14/2018 CLINICAL HISTORY: Chest pain FINDINGS: LUNGS AND PLEURA: No focal pulmonary opacity. No pulmonary edema. No pleural effusion. No pneumothorax. HEART AND MEDIASTINUM: Mild cardiomegaly. BONES AND SOFT TISSUES: No acute osseous abnormality. IMPRESSION: 1. No acute cardiopulmonary process. 2. Mild cardiomegaly. Electronically signed by: Franky Crease MD 04/30/2024 11:46 PM EST RP Workstation: [id number]     Procedures   Medications Ordered in the ED  ketorolac  (TORADOL ) 30 MG/ML injection 30 mg (30 mg Intramuscular Given 05/01/24 0034)                                    Medical Decision Making Amount and/or Complexity of Data Reviewed Labs: ordered. Radiology: ordered.  Risk Prescription drug  management.   This patient presents to the ED for concern of chest pain, this involves an extensive number of treatment options, and is a complaint that carries with it a high risk of complications and morbidity.  I considered the following differential and admission for this acute, potentially life threatening condition.  The differential diagnosis includes ACS, PE, pneumothorax, pneumonia, chest wall pain  MDM:    This is a 53 year old female who presents with chest pain.  She is overall nontoxic and vital signs are largely reassuring.  History of hypertension diabetes.  EKG shows no evidence of acute arrhythmia or ischemia.  She has reproducible chest pain  on exam and reports worsening with movement.  This is suggestive of musculoskeletal etiology.  Labs obtained and reassuring including troponin x 2.  Chest x-ray without pneumothorax or pneumonia.  Recommend NSAIDs for likely chest wall pain.  However, given her risk factors, will place referral for cardiology as an outpatient for definitive evaluation.  (Labs, imaging, consults)  Labs: I Ordered, and personally interpreted labs.  The pertinent results include: CBC, BMP, troponin x 2  Imaging Studies ordered: I ordered imaging studies including chest x-ray I independently visualized and interpreted imaging. I agree with the radiologist interpretation  Additional history obtained from chart review.  External records from outside source obtained and reviewed including prior evaluations  Cardiac Monitoring: The patient was maintained on a cardiac monitor.  If on the cardiac monitor, I personally viewed and interpreted the cardiac monitored which showed an underlying rhythm of: Sinus  Reevaluation: After the interventions noted above, I reevaluated the patient and found that they have :improved  Social Determinants of Health:  lives independently  Disposition: Discharge  Co morbidities that complicate the patient evaluation  Past  Medical History:  Diagnosis Date   Anemia    iron deficency   Anginal pain    Asthma    Breast discharge 01/10/2017   Complication of anesthesia    Congenital ventricular septal defect    COPD (chronic obstructive pulmonary disease) (HCC)    Diabetes mellitus without complication (HCC)    Dyspnea    R/T heart defect   Family history of adverse reaction to anesthesia    father - n/v   Headache    Heart murmur    Hypertension    Obesity    PONV (postoperative nausea and vomiting)    Pulmonary hypertension (HCC)    mild    Stroke (HCC) 2023   TIA x9   Tricuspid regurgitation    VSD (ventricular septal defect), perimembranous    Qp:Qs 1.7:1 by catheterization      Medicines Meds ordered this encounter  Medications   ketorolac  (TORADOL ) 30 MG/ML injection 30 mg   naproxen  (NAPROSYN ) 500 MG tablet    Sig: Take 1 tablet (500 mg total) by mouth 2 (two) times daily.    Dispense:  30 tablet    Refill:  0    I have reviewed the patients home medicines and have made adjustments as needed  Problem List / ED Course: Problem List Items Addressed This Visit   None Visit Diagnoses       Chest wall pain    -  Primary   Relevant Orders   Ambulatory referral to Cardiology                Final diagnoses:  Chest wall pain    ED Discharge Orders          Ordered    naproxen  (NAPROSYN ) 500 MG tablet  2 times daily        05/01/24 0234    Ambulatory referral to Cardiology        05/01/24 0234               Bari Charmaine FALCON, MD 05/01/24 [phone]

## 2024-05-01 NOTE — Discharge Instructions (Addendum)
 You were seen today for chest pain.  This is likely chest wall pain given your recent heavy lifting.  However given your risk factors, follow-up with cardiology.  Take naproxen  as needed for pain.

## 2024-05-06 ENCOUNTER — Encounter (HOSPITAL_BASED_OUTPATIENT_CLINIC_OR_DEPARTMENT_OTHER): Payer: Self-pay

## 2024-05-06 ENCOUNTER — Emergency Department (HOSPITAL_BASED_OUTPATIENT_CLINIC_OR_DEPARTMENT_OTHER)
Admission: EM | Admit: 2024-05-06 | Discharge: 2024-05-06 | Disposition: A | Discharge status: Home / Self Care | Attending: Emergency Medicine | Admitting: Emergency Medicine

## 2024-05-06 ENCOUNTER — Other Ambulatory Visit: Payer: Self-pay

## 2024-05-06 DIAGNOSIS — R0789 Other chest pain: Secondary | ICD-10-CM | POA: Diagnosis not present

## 2024-05-06 DIAGNOSIS — Z91040 Latex allergy status: Secondary | ICD-10-CM | POA: Insufficient documentation

## 2024-05-06 LAB — COMPREHENSIVE METABOLIC PANEL WITH GFR
ALT: 35 U/L (ref 0–44)
AST: 35 U/L (ref 15–41)
Albumin: 4.4 g/dL (ref 3.5–5.0)
Alkaline Phosphatase: 103 U/L (ref 38–126)
Anion gap: 10 (ref 5–15)
BUN: 10 mg/dL (ref 6–20)
CO2: 26 mmol/L (ref 22–32)
Calcium: 9.9 mg/dL (ref 8.9–10.3)
Chloride: 104 mmol/L (ref 98–111)
Creatinine, Ser: 0.74 mg/dL (ref 0.44–1.00)
GFR, Estimated: >60 mL/min (ref >60)
Glucose, Bld: 221 mg/dL — ABNORMAL HIGH (ref 70–99)
Potassium: 3.7 mmol/L (ref 3.5–5.1)
Sodium: 140 mmol/L (ref 135–145)
Total Bilirubin: 0.8 mg/dL (ref 0.0–1.2)
Total Protein: 7 g/dL (ref 6.5–8.1)

## 2024-05-06 LAB — CBC WITH DIFFERENTIAL/PLATELET
Abs Immature Granulocytes: 0.01 K/uL (ref 0.00–0.07)
Basophils Absolute: 0 K/uL (ref 0.0–0.1)
Basophils Relative: 0 %
Eosinophils Absolute: 0.1 K/uL (ref 0.0–0.5)
Eosinophils Relative: 2 %
HCT: 38.4 % (ref 36.0–46.0)
Hemoglobin: 13.1 g/dL (ref 12.0–15.0)
Immature Granulocytes: 0 %
Lymphocytes Relative: 37 %
Lymphs Abs: 1.6 K/uL (ref 0.7–4.0)
MCH: 29.6 pg (ref 26.0–34.0)
MCHC: 34.1 g/dL (ref 30.0–36.0)
MCV: 86.9 fL (ref 80.0–100.0)
Monocytes Absolute: 0.4 K/uL (ref 0.1–1.0)
Monocytes Relative: 9 %
Neutro Abs: 2.2 K/uL (ref 1.7–7.7)
Neutrophils Relative %: 52 %
Platelets: 195 K/uL (ref 150–400)
RBC: 4.42 MIL/uL (ref 3.87–5.11)
RDW: 13.1 % (ref 11.5–15.5)
WBC: 4.2 K/uL (ref 4.0–10.5)
nRBC: 0 % (ref 0.0–0.2)

## 2024-05-06 LAB — TROPONIN T, HIGH SENSITIVITY: Troponin T High Sensitivity: <15 ng/L (ref 0–19)

## 2024-05-06 MED ORDER — IBUPROFEN 600 MG PO TABS: 600.0000 mg | ORAL_TABLET | Freq: Four times a day (QID) | ORAL | 0 refills | Status: AC | PRN

## 2024-05-06 MED ORDER — HYDROCODONE-ACETAMINOPHEN 5-325 MG PO TABS: 1.0000 | ORAL_TABLET | ORAL | 0 refills | Status: AC | PRN

## 2024-05-06 MED ORDER — HYDROCODONE-ACETAMINOPHEN 5-325 MG PO TABS
1.0000 | ORAL_TABLET | Freq: Once | ORAL | Status: AC
  Administered 2024-05-06: 1 via ORAL
  Filled 2024-05-06: qty 1

## 2024-05-06 NOTE — Discharge Instructions (Addendum)
 As we discussed, your labs and EKG tonight are reassuring and no serious condition is felt present. You can be discharged home. Recommend ibuprofen  used regularly, and Norco for severe pain as needed. Warm compresses may help as well.   Please advise your cardiologist at [facility] of the symptoms you are having. They can view your medical record here and decide with you whether you need to be seen by them given your history with them, or keep the scheduled appointment here in [place].   If you develop worsening symptoms, please do not hesitate to return to the ED at any time.

## 2024-05-06 NOTE — ED Triage Notes (Signed)
 Was seen here last week for the same. CP on left side that radiates to left arm, shoulder and neck +nausea

## 2024-05-06 NOTE — ED Provider Notes (Signed)
 [place] EMERGENCY DEPARTMENT AT [facility] Provider Note   CSN: [id number] Arrival date & time: 05/06/24  8164     Patient presents with: Chest Pain   Mackenzie Patterson is a 53 y.o. female.   Patient to ED for re-examination of left sided chest pain. Seen 11/5 for same and diagnosed with chest wall pain, she reports it is more sore and now involves the left arm. No swelling. She has taken Tylenol  only which is not helping. No fever, cough, congestion. She was given an ambulatory referral to cardiology after visit of 11/5 and has a scheduled appointment pending. She is also followed at [facility] for VSD. No history of ischemic disease. No risk factors for PE. No SOB.   The history is provided by the patient. No language interpreter was used.  Chest Pain      Prior to Admission medications   Medication Sig Start Date End Date Taking? Authorizing Provider  HYDROcodone -acetaminophen  (NORCO/VICODIN) 5-325 MG tablet Take 1 tablet by mouth every 4 (four) hours as needed for severe pain (pain score 7-10). 05/06/24  Yes Lorilee Cafarella, Margit, PA-C  ibuprofen  (ADVIL ) 600 MG tablet Take 1 tablet (600 mg total) by mouth every 6 (six) hours as needed. 05/06/24  Yes Odell Margit, PA-C  Accu-Chek FastClix Lancets MISC USE 2 TIMES DAILY AS DIRECTED 07/26/19   Celestia Rosaline SQUIBB, NP  acyclovir  (ZOVIRAX ) 400 MG tablet Take 1 tablet (400 mg total) by mouth 2 (two) times daily. 04/27/22   Rudy Carlin LABOR, MD  albuterol  (ACCUNEB ) 0.63 MG/3ML nebulizer solution Take 1 ampule by nebulization 3 (three) times daily as needed for wheezing or shortness of breath. 08/05/22   [provider]  albuterol  (VENTOLIN  HFA) 108 (90 Base) MCG/ACT inhaler Inhale 2 puffs into the lungs every 6 (six) hours as needed for wheezing or shortness of breath. 01/27/20   Celestia Rosaline SQUIBB, NP  amLODipine  (NORVASC ) 5 MG tablet Take 1 tablet (5 mg total) by mouth daily. 03/24/21   Celestia Rosaline SQUIBB, NP  atorvastatin  (LIPITOR )  40 MG tablet Take 40 mg by mouth daily. 02/14/21   [provider]  cetirizine  (ZYRTEC ) 10 MG tablet Take 1 tablet (10 mg total) by mouth daily. At bedtime as needed 05/06/21   Celestia Rosaline SQUIBB, NP  clopidogrel  (PLAVIX ) 75 MG tablet Take 75 mg by mouth daily.    [provider]  fluticasone  (FLONASE ) 50 MCG/ACT nasal spray SPRAY 2 SPRAYS INTO EACH NOSTRIL EVERY DAY Patient taking differently: Place 2 sprays into both nostrils daily as needed for allergies or rhinitis. 03/25/21   Celestia Rosaline SQUIBB, NP  gabapentin  (NEURONTIN ) 300 MG capsule Take 1 capsule (300 mg total) by mouth 3 (three) times daily. 10/10/23   Tad Arland POUR, CNM  glucose blood ([employer] GLUCOSE METER TEST STRIPS) test strip Use as instructed 07/30/19   Celestia Rosaline SQUIBB, NP  hydrOXYzine  (ATARAX ) 25 MG tablet Take 1 tablet (25 mg total) by mouth every 6 (six) hours. Take as needed for itching 03/17/24   Simon Lavonia SAILOR, MD  JANUMET  50-1000 MG tablet TAKE 1 TABLET DAILY AFTER BREAKFAST 08/30/21   Celestia Rosaline SQUIBB, NP  magic mouthwash (nystatin , lidocaine , diphenhydrAMINE , alum & mag hydroxide) suspension Swish and spit 5 mLs 3 (three) times daily as needed for mouth pain. 03/05/24   Geiple, Joshua, PA-C  meloxicam  (MOBIC ) 7.5 MG tablet Take 1 tablet (7.5 mg total) by mouth daily. 11/17/23   Midge Golas, MD  naproxen  (NAPROSYN ) 500 MG tablet Take 1  tablet (500 mg total) by mouth 2 (two) times daily. 05/01/24   Horton, Charmaine FALCON, MD  SYMBICORT  160-4.5 MCG/ACT inhaler INHALE 2 PUFFS INTO THE LUNGS TWICE A DAY 03/09/21   Celestia Rosaline SQUIBB, NP  tacrolimus  (PROTOPIC ) 0.1 % ointment Apply topically 2 (two) times daily. Apply thin layer of 0.1% ointment to affected area twice daily 10/10/23   Lo, Arland POUR, CNM  triamcinolone  cream (KENALOG ) 0.1 % APPLY TO AFFECTED AREA TWICE A DAY 05/01/21   Celestia Rosaline SQUIBB, NP    Allergies: Penicillins, Tape, Other, Benadryl  [diphenhydramine ], Drug ingredient [benzonatate ], Metformin  and  related, Vitamin k, and Latex    Review of Systems  Cardiovascular:  Positive for chest pain.    Updated Vital Signs BP 124/70   Pulse 80   Temp 97.8 F (36.6 C) (Oral)   Resp 18   Ht 4' 10 (1.473 m)   Wt 73.5 kg   LMP 09/27/2022 Comment: POCT Urine negative 10/04/22  SpO2 98%   BMI 33.86 kg/m   Physical Exam Vitals and nursing note reviewed.  Constitutional:      Appearance: She is well-developed.  HENT:     Head: Normocephalic.  Cardiovascular:     Rate and Rhythm: Normal rate and regular rhythm.     Heart sounds: No murmur heard. Pulmonary:     Effort: Pulmonary effort is normal.     Breath sounds: Normal breath sounds. No wheezing, rhonchi or rales.  Chest:     Chest wall: Tenderness (Chest wall tenderness to palpation over left chest and left lateral chest wall) present.  Abdominal:     General: Bowel sounds are normal.     Palpations: Abdomen is soft.     Tenderness: There is no abdominal tenderness. There is no guarding or rebound.  Musculoskeletal:        General: Normal range of motion.     Cervical back: Normal range of motion and neck supple.     Comments: Left upper arm tender to palpation without swelling or discoloration. Distal pulses present.   Skin:    General: Skin is warm and dry.  Neurological:     General: No focal deficit present.     Mental Status: She is alert and oriented to person, place, and time.     (all labs ordered are listed, but only abnormal results are displayed) Labs Reviewed  COMPREHENSIVE METABOLIC PANEL WITH GFR - Abnormal; Notable for the following components:      Result Value   Glucose, Bld 221 (*)    All other components within normal limits  CBC WITH DIFFERENTIAL/PLATELET  TROPONIN T, HIGH SENSITIVITY  TROPONIN T, HIGH SENSITIVITY    EKG: None  Radiology: No results found.   Procedures   Medications Ordered in the ED  HYDROcodone -acetaminophen  (NORCO/VICODIN) 5-325 MG per tablet 1 tablet (1 tablet Oral  Given 05/06/24 2159)    Clinical Course as of 05/06/24 2214  Mon May 06, 2024  2200 Patient with left chest pain into left arm. Worse when she moves. H/O VSD, 5/6 murmur present, reports 'sometimes has chest wall pain with this. No fever cough. No SOB. No risk factors for PE. Labs, including troponin, negative. CXR from 11/5 reviewed and is negative. Pain is easily reproducible suggesting MSK type pain. Taking Tylenol  without relief. She has a pending appointment with cardiology in [place]. Encouraged her to inform her cardiologist at [facility] of current symptoms as well. Will provide pain relief, encourage use of ibuprofen , heat.  Patient's condition discussed with ED Attending, Dr. Yolande. She is felt appropriate for discharge. [SU]    Clinical Course User Index [SU] Odell Balls, PA-C                                 Medical Decision Making Amount and/or Complexity of Data Reviewed Labs: ordered.  Risk Prescription drug management.        Final diagnoses:  Chest wall pain    ED Discharge Orders          Ordered    HYDROcodone -acetaminophen  (NORCO/VICODIN) 5-325 MG tablet  Every 4 hours PRN        05/06/24 2210    ibuprofen  (ADVIL ) 600 MG tablet  Every 6 hours PRN        05/06/24 2210               Odell Balls, PA-C 05/06/24 2214    Yolande Lamar BROCKS, MD 05/08/24 1049

## 2024-05-09 ENCOUNTER — Other Ambulatory Visit: Payer: Self-pay | Admitting: Internal Medicine

## 2024-05-09 DIAGNOSIS — R7401 Elevation of levels of liver transaminase levels: Secondary | ICD-10-CM

## 2024-05-09 DIAGNOSIS — K7581 Nonalcoholic steatohepatitis (NASH): Secondary | ICD-10-CM

## 2024-05-16 ENCOUNTER — Ambulatory Visit
Admission: RE | Admit: 2024-05-16 | Discharge: 2024-05-16 | Disposition: A | Source: Ambulatory Visit | Attending: Internal Medicine | Admitting: Internal Medicine

## 2024-05-16 DIAGNOSIS — R7401 Elevation of levels of liver transaminase levels: Secondary | ICD-10-CM

## 2024-05-16 DIAGNOSIS — K7581 Nonalcoholic steatohepatitis (NASH): Secondary | ICD-10-CM

## 2024-05-17 DIAGNOSIS — Q210 Ventricular septal defect: Secondary | ICD-10-CM | POA: Diagnosis not present

## 2024-06-05 ENCOUNTER — Encounter (HOSPITAL_BASED_OUTPATIENT_CLINIC_OR_DEPARTMENT_OTHER): Payer: Self-pay | Admitting: Certified Nurse Midwife

## 2024-06-05 ENCOUNTER — Ambulatory Visit (HOSPITAL_BASED_OUTPATIENT_CLINIC_OR_DEPARTMENT_OTHER): Admitting: Certified Nurse Midwife

## 2024-06-05 VITALS — BP 115/63 | HR 73 | Ht <= 58 in | Wt 160.4 lb

## 2024-06-05 DIAGNOSIS — L80 Vitiligo: Secondary | ICD-10-CM

## 2024-06-05 NOTE — Progress Notes (Signed)
 Subjective:     Mackenzie Patterson is a 53 y.o. female who presents for skin peeling from vagina. No vaginal spotting or bleeding. No vaginal itching, discharge or lesions.    The following portions of the patient's history were reviewed and updated as appropriate: allergies, current medications, past family history, past medical history, past social history, past surgical history, and problem list.   Review of Systems Pertinent items are noted in HPI.    Objective:    BP 115/63   Pulse 73   Ht 4' 10 (1.473 m) Comment: Reported  Wt 160 lb 6.4 oz (72.8 kg)   LMP 09/27/2022 Comment: POCT Urine negative 10/04/22  BMI 33.52 kg/m  General appearance: alert and cooperative Pelvic: vagina normal without discharge and pt has vitiligo noted on vulva (labia)    Assessment:    Vitiligo.    Plan:    Discussed that skin changes noted consistent with Vitiligo. Referral placed to Dermatology. Pt will RTO for her scheduled annual gynecological exam.  Arland MARLA Roller

## 2024-06-18 ENCOUNTER — Encounter (HOSPITAL_BASED_OUTPATIENT_CLINIC_OR_DEPARTMENT_OTHER): Payer: Self-pay | Admitting: *Deleted

## 2024-06-18 ENCOUNTER — Other Ambulatory Visit: Payer: Self-pay

## 2024-06-18 ENCOUNTER — Emergency Department (HOSPITAL_BASED_OUTPATIENT_CLINIC_OR_DEPARTMENT_OTHER)
Admission: EM | Admit: 2024-06-18 | Discharge: 2024-06-18 | Disposition: A | Discharge status: Home / Self Care | Attending: Emergency Medicine | Admitting: Emergency Medicine

## 2024-06-18 DIAGNOSIS — S76012A Strain of muscle, fascia and tendon of left hip, initial encounter: Secondary | ICD-10-CM

## 2024-06-18 DIAGNOSIS — Z91040 Latex allergy status: Secondary | ICD-10-CM | POA: Diagnosis not present

## 2024-06-18 DIAGNOSIS — E119 Type 2 diabetes mellitus without complications: Secondary | ICD-10-CM | POA: Diagnosis not present

## 2024-06-18 DIAGNOSIS — M25552 Pain in left hip: Secondary | ICD-10-CM | POA: Insufficient documentation

## 2024-06-18 DIAGNOSIS — R1032 Left lower quadrant pain: Secondary | ICD-10-CM | POA: Diagnosis not present

## 2024-06-18 DIAGNOSIS — Z79899 Other long term (current) drug therapy: Secondary | ICD-10-CM | POA: Insufficient documentation

## 2024-06-18 DIAGNOSIS — Z7984 Long term (current) use of oral hypoglycemic drugs: Secondary | ICD-10-CM | POA: Diagnosis not present

## 2024-06-18 DIAGNOSIS — Z7902 Long term (current) use of antithrombotics/antiplatelets: Secondary | ICD-10-CM | POA: Insufficient documentation

## 2024-06-18 DIAGNOSIS — I10 Essential (primary) hypertension: Secondary | ICD-10-CM | POA: Diagnosis not present

## 2024-06-18 MED ORDER — NAPROXEN 500 MG PO TABS: 500.0000 mg | ORAL_TABLET | Freq: Two times a day (BID) | ORAL | 0 refills | Status: AC

## 2024-06-18 MED ORDER — TRAMADOL HCL 50 MG PO TABS: 50.0000 mg | ORAL_TABLET | Freq: Four times a day (QID) | ORAL | 0 refills | Status: AC | PRN

## 2024-06-18 NOTE — ED Provider Notes (Signed)
 " [place] EMERGENCY DEPARTMENT AT [facility] Provider Note   CSN: [id number] Arrival date & time: 06/18/24  9592     Patient presents with: Groin Pain   Mackenzie Patterson is a 53 y.o. female.   Patient is a 53 year old female with history of hypertension, type 2 diabetes, pulmonary hypertension.  Patient presenting today with complaints of left groin pain.  This began yesterday while she was ambulating.  She denies any lumps, bumps, or bulges.  She denies any bowel or bladder complaints.  No weakness or numbness.       Prior to Admission medications  Medication Sig Start Date End Date Taking? Authorizing Provider  Accu-Chek FastClix Lancets MISC USE 2 TIMES DAILY AS DIRECTED 07/26/19   Celestia Rosaline SQUIBB, NP  acyclovir  (ZOVIRAX ) 400 MG tablet Take 1 tablet (400 mg total) by mouth 2 (two) times daily. 04/27/22   Rudy Carlin LABOR, MD  albuterol  (ACCUNEB ) 0.63 MG/3ML nebulizer solution Take 1 ampule by nebulization 3 (three) times daily as needed for wheezing or shortness of breath. 08/05/22   [provider]  albuterol  (VENTOLIN  HFA) 108 (90 Base) MCG/ACT inhaler Inhale 2 puffs into the lungs every 6 (six) hours as needed for wheezing or shortness of breath. 01/27/20   Celestia Rosaline SQUIBB, NP  amLODipine  (NORVASC ) 5 MG tablet Take 1 tablet (5 mg total) by mouth daily. 03/24/21   Celestia Rosaline SQUIBB, NP  atorvastatin  (LIPITOR ) 40 MG tablet Take 40 mg by mouth daily. 02/14/21   [provider]  cetirizine  (ZYRTEC ) 10 MG tablet Take 1 tablet (10 mg total) by mouth daily. At bedtime as needed 05/06/21   Celestia Rosaline SQUIBB, NP  clopidogrel  (PLAVIX ) 75 MG tablet Take 75 mg by mouth daily.    [provider]  fluticasone  (FLONASE ) 50 MCG/ACT nasal spray SPRAY 2 SPRAYS INTO EACH NOSTRIL EVERY DAY Patient taking differently: Place 2 sprays into both nostrils daily as needed for allergies or rhinitis. 03/25/21   Celestia Rosaline SQUIBB, NP  gabapentin  (NEURONTIN ) 300 MG  capsule Take 1 capsule (300 mg total) by mouth 3 (three) times daily. 10/10/23   Tad Arland POUR, CNM  glucose blood ([employer] GLUCOSE METER TEST STRIPS) test strip Use as instructed 07/30/19   Celestia Rosaline SQUIBB, NP  HYDROcodone -acetaminophen  (NORCO/VICODIN) 5-325 MG tablet Take 1 tablet by mouth every 4 (four) hours as needed for severe pain (pain score 7-10). 05/06/24   Odell Balls, PA-C  hydrOXYzine  (ATARAX ) 25 MG tablet Take 1 tablet (25 mg total) by mouth every 6 (six) hours. Take as needed for itching 03/17/24   Simon Lavonia SAILOR, MD  ibuprofen  (ADVIL ) 600 MG tablet Take 1 tablet (600 mg total) by mouth every 6 (six) hours as needed. 05/06/24   Odell Balls, PA-C  JANUMET  50-1000 MG tablet TAKE 1 TABLET DAILY AFTER BREAKFAST 08/30/21   Celestia Rosaline SQUIBB, NP  magic mouthwash (nystatin , lidocaine , diphenhydrAMINE , alum & mag hydroxide) suspension Swish and spit 5 mLs 3 (three) times daily as needed for mouth pain. 03/05/24   Geiple, Joshua, PA-C  meloxicam  (MOBIC ) 7.5 MG tablet Take 1 tablet (7.5 mg total) by mouth daily. 11/17/23   Midge Golas, MD  naproxen  (NAPROSYN ) 500 MG tablet Take 1 tablet (500 mg total) by mouth 2 (two) times daily. 05/01/24   Horton, Charmaine FALCON, MD  Semaglutide,0.25 or 0.5MG /DOS, (OZEMPIC, 0.25 OR 0.5 MG/DOSE,) 2 MG/1.5ML SOPN Inject 0.25 mg into the skin once a week.    [provider]  SYMBICORT  160-4.5 MCG/ACT  inhaler INHALE 2 PUFFS INTO THE LUNGS TWICE A DAY 03/09/21   Celestia Rosaline SQUIBB, NP  tacrolimus  (PROTOPIC ) 0.1 % ointment Apply topically 2 (two) times daily. Apply thin layer of 0.1% ointment to affected area twice daily 10/10/23   Lo, Donna K, CNM  triamcinolone  cream (KENALOG ) 0.1 % APPLY TO AFFECTED AREA TWICE A DAY 05/01/21   Celestia Rosaline SQUIBB, NP    Allergies: Penicillins, Tape, Other, Benadryl  [diphenhydramine ], Drug ingredient [benzonatate ], Metformin  and related, Vitamin k, and Latex    Review of Systems  All other systems reviewed and are  negative.   Updated Vital Signs BP 126/70 (BP Location: Right Arm)   Pulse 78   Temp 98.5 F (36.9 C) (Oral)   Resp 16   Ht 4' 11 (1.499 m)   Wt 72.6 kg   LMP 09/27/2022 Comment: POCT Urine negative 10/04/22  SpO2 99%   BMI 32.32 kg/m   Physical Exam Vitals and nursing note reviewed.  Constitutional:      Appearance: Normal appearance.  HENT:     Head: Normocephalic.  Pulmonary:     Effort: Pulmonary effort is normal.  Musculoskeletal:     Comments: The left groin and appears grossly normal.  There is no tenderness to the lateral aspect of the hip.  There is tenderness to palpation over the hip flexor and there is pain with range of motion.  I am unable to appreciate any hernia or other abnormality.  Skin:    General: Skin is warm and dry.  Neurological:     Mental Status: She is alert and oriented to person, place, and time.     (all labs ordered are listed, but only abnormal results are displayed) Labs Reviewed - No data to display  EKG: None  Radiology: No results found.   Procedures   Medications Ordered in the ED - No data to display                                  Medical Decision Making  Patient presenting with left groin pain that I strongly believe is related to a strain of her hip flexor.  Patient will be treated with rest, NSAIDs, tramadol , and follow-up as needed.     Final diagnoses:  None    ED Discharge Orders     None          Geroldine Berg, MD 06/18/24 [phone]  "

## 2024-06-18 NOTE — Discharge Instructions (Signed)
 Begin taking naproxen  as prescribed.  Begin taking tramadol  as prescribed as needed for pain not relieved with naproxen .  Follow-up with primary doctor if symptoms are not improving in the next few days.

## 2024-06-18 NOTE — ED Triage Notes (Signed)
 Pt ambulatory to treatment room. Reports yesterday she was walking and had onset of left groin pain.Denies any specific injury. Taking ibuprofen  and tylenol  with out relief.

## 2024-06-24 ENCOUNTER — Other Ambulatory Visit: Payer: Self-pay | Admitting: Internal Medicine

## 2024-06-24 DIAGNOSIS — Z1231 Encounter for screening mammogram for malignant neoplasm of breast: Secondary | ICD-10-CM

## 2024-07-25 ENCOUNTER — Ambulatory Visit

## 2024-08-01 ENCOUNTER — Ambulatory Visit
Admission: RE | Admit: 2024-08-01 | Discharge: 2024-08-01 | Disposition: A | Source: Ambulatory Visit | Attending: Internal Medicine | Admitting: Internal Medicine

## 2024-08-01 DIAGNOSIS — Z1231 Encounter for screening mammogram for malignant neoplasm of breast: Secondary | ICD-10-CM

## 2024-10-11 ENCOUNTER — Ambulatory Visit (HOSPITAL_BASED_OUTPATIENT_CLINIC_OR_DEPARTMENT_OTHER): Admitting: Certified Nurse Midwife

## 2025-01-28 ENCOUNTER — Ambulatory Visit: Admitting: Physician Assistant
